# Patient Record
Sex: Female | Born: 1960 | Race: Black or African American | Hispanic: No | Marital: Married | Smoking: Current some day smoker
Health system: Southern US, Community
[De-identification: ages and names within clinical notes are randomized; demographics above are authoritative.]

## PROBLEM LIST (undated history)

## (undated) DIAGNOSIS — K5790 Diverticulosis of intestine, part unspecified, without perforation or abscess without bleeding: Secondary | ICD-10-CM

## (undated) DIAGNOSIS — C50919 Malignant neoplasm of unspecified site of unspecified female breast: Secondary | ICD-10-CM

## (undated) DIAGNOSIS — M545 Low back pain, unspecified: Secondary | ICD-10-CM

## (undated) DIAGNOSIS — M1990 Unspecified osteoarthritis, unspecified site: Secondary | ICD-10-CM

## (undated) DIAGNOSIS — K635 Polyp of colon: Secondary | ICD-10-CM

## (undated) DIAGNOSIS — Z923 Personal history of irradiation: Secondary | ICD-10-CM

## (undated) DIAGNOSIS — I10 Essential (primary) hypertension: Secondary | ICD-10-CM

## (undated) DIAGNOSIS — C50311 Malignant neoplasm of lower-inner quadrant of right female breast: Principal | ICD-10-CM

## (undated) DIAGNOSIS — R51 Headache: Secondary | ICD-10-CM

## (undated) DIAGNOSIS — E119 Type 2 diabetes mellitus without complications: Secondary | ICD-10-CM

## (undated) DIAGNOSIS — Z9221 Personal history of antineoplastic chemotherapy: Secondary | ICD-10-CM

## (undated) DIAGNOSIS — K8020 Calculus of gallbladder without cholecystitis without obstruction: Secondary | ICD-10-CM

## (undated) DIAGNOSIS — M5030 Other cervical disc degeneration, unspecified cervical region: Secondary | ICD-10-CM

## (undated) DIAGNOSIS — G8929 Other chronic pain: Secondary | ICD-10-CM

## (undated) DIAGNOSIS — K219 Gastro-esophageal reflux disease without esophagitis: Secondary | ICD-10-CM

## (undated) HISTORY — DX: Headache: R51

## (undated) HISTORY — DX: Unspecified osteoarthritis, unspecified site: M19.90

## (undated) HISTORY — DX: Low back pain, unspecified: M54.50

## (undated) HISTORY — DX: Type 2 diabetes mellitus without complications: E11.9

## (undated) HISTORY — DX: Essential (primary) hypertension: I10

## (undated) HISTORY — DX: Polyp of colon: K63.5

## (undated) HISTORY — PX: TONSILLECTOMY: SUR1361

## (undated) HISTORY — DX: Malignant neoplasm of lower-inner quadrant of right female breast: C50.311

## (undated) HISTORY — DX: Malignant neoplasm of unspecified site of unspecified female breast: C50.919

## (undated) HISTORY — DX: Diverticulosis of intestine, part unspecified, without perforation or abscess without bleeding: K57.90

## (undated) HISTORY — DX: Low back pain: M54.5

## (undated) HISTORY — DX: Calculus of gallbladder without cholecystitis without obstruction: K80.20

## (undated) HISTORY — PX: GANGLION CYST EXCISION: SHX1691

## (undated) HISTORY — PX: KNEE ARTHROSCOPY: SHX127

---

## 1998-04-13 ENCOUNTER — Ambulatory Visit (HOSPITAL_COMMUNITY): Admission: RE | Admit: 1998-04-13 | Discharge: 1998-04-13 | Payer: Self-pay | Admitting: Family Medicine

## 1998-06-28 ENCOUNTER — Encounter: Payer: Self-pay | Admitting: Family Medicine

## 1998-06-28 ENCOUNTER — Ambulatory Visit (HOSPITAL_COMMUNITY): Admission: RE | Admit: 1998-06-28 | Discharge: 1998-06-28 | Payer: Self-pay | Admitting: Family Medicine

## 1998-07-21 ENCOUNTER — Emergency Department (HOSPITAL_COMMUNITY): Admission: EM | Admit: 1998-07-21 | Discharge: 1998-07-21 | Payer: Self-pay | Admitting: Emergency Medicine

## 1998-07-21 ENCOUNTER — Encounter: Payer: Self-pay | Admitting: Emergency Medicine

## 1998-07-22 ENCOUNTER — Emergency Department (HOSPITAL_COMMUNITY): Admission: EM | Admit: 1998-07-22 | Discharge: 1998-07-22 | Payer: Self-pay | Admitting: Emergency Medicine

## 1998-08-21 ENCOUNTER — Ambulatory Visit (HOSPITAL_BASED_OUTPATIENT_CLINIC_OR_DEPARTMENT_OTHER): Admission: RE | Admit: 1998-08-21 | Discharge: 1998-08-21 | Payer: Self-pay | Admitting: Orthopaedic Surgery

## 1998-10-31 ENCOUNTER — Emergency Department (HOSPITAL_COMMUNITY): Admission: EM | Admit: 1998-10-31 | Discharge: 1998-11-01 | Payer: Self-pay

## 1999-03-05 ENCOUNTER — Encounter: Payer: Self-pay | Admitting: Internal Medicine

## 1999-03-05 ENCOUNTER — Ambulatory Visit (HOSPITAL_COMMUNITY): Admission: RE | Admit: 1999-03-05 | Discharge: 1999-03-05 | Payer: Self-pay | Admitting: Internal Medicine

## 1999-04-09 ENCOUNTER — Encounter: Payer: Self-pay | Admitting: Neurosurgery

## 1999-04-09 ENCOUNTER — Observation Stay (HOSPITAL_COMMUNITY): Admission: RE | Admit: 1999-04-09 | Discharge: 1999-04-10 | Payer: Self-pay | Admitting: Neurosurgery

## 1999-06-12 ENCOUNTER — Encounter: Payer: Self-pay | Admitting: Emergency Medicine

## 1999-06-12 ENCOUNTER — Emergency Department (HOSPITAL_COMMUNITY): Admission: EM | Admit: 1999-06-12 | Discharge: 1999-06-12 | Payer: Self-pay | Admitting: Emergency Medicine

## 1999-11-06 ENCOUNTER — Ambulatory Visit (HOSPITAL_COMMUNITY): Admission: RE | Admit: 1999-11-06 | Discharge: 1999-11-06 | Payer: Self-pay | Admitting: Family Medicine

## 1999-11-06 ENCOUNTER — Encounter (INDEPENDENT_AMBULATORY_CARE_PROVIDER_SITE_OTHER): Payer: Self-pay

## 1999-12-23 ENCOUNTER — Emergency Department (HOSPITAL_COMMUNITY): Admission: EM | Admit: 1999-12-23 | Discharge: 1999-12-23 | Payer: Self-pay | Admitting: Internal Medicine

## 2000-01-02 ENCOUNTER — Ambulatory Visit (HOSPITAL_BASED_OUTPATIENT_CLINIC_OR_DEPARTMENT_OTHER): Admission: RE | Admit: 2000-01-02 | Discharge: 2000-01-02 | Payer: Self-pay | Admitting: Orthopaedic Surgery

## 2000-01-02 HISTORY — PX: KNEE ARTHROSCOPY: SHX127

## 2000-08-05 ENCOUNTER — Encounter: Payer: Self-pay | Admitting: *Deleted

## 2000-08-05 ENCOUNTER — Ambulatory Visit (HOSPITAL_COMMUNITY): Admission: RE | Admit: 2000-08-05 | Discharge: 2000-08-05 | Payer: Self-pay | Admitting: *Deleted

## 2000-08-27 ENCOUNTER — Ambulatory Visit (HOSPITAL_COMMUNITY): Admission: RE | Admit: 2000-08-27 | Discharge: 2000-08-27 | Payer: Self-pay | Admitting: *Deleted

## 2000-11-11 ENCOUNTER — Encounter: Admission: RE | Admit: 2000-11-11 | Discharge: 2000-11-11 | Payer: Self-pay | Admitting: Family Medicine

## 2000-11-11 ENCOUNTER — Encounter: Payer: Self-pay | Admitting: Family Medicine

## 2001-02-06 ENCOUNTER — Ambulatory Visit (HOSPITAL_COMMUNITY): Admission: RE | Admit: 2001-02-06 | Discharge: 2001-02-06 | Payer: Self-pay | Admitting: Orthopedic Surgery

## 2001-04-16 ENCOUNTER — Encounter: Payer: Self-pay | Admitting: Orthopaedic Surgery

## 2001-04-20 ENCOUNTER — Inpatient Hospital Stay (HOSPITAL_COMMUNITY): Admission: RE | Admit: 2001-04-20 | Discharge: 2001-04-23 | Payer: Self-pay | Admitting: Orthopaedic Surgery

## 2001-04-20 HISTORY — PX: REPLACEMENT UNICONDYLAR JOINT KNEE: SUR1227

## 2001-10-12 ENCOUNTER — Inpatient Hospital Stay (HOSPITAL_COMMUNITY): Admission: RE | Admit: 2001-10-12 | Discharge: 2001-10-16 | Payer: Self-pay | Admitting: Orthopaedic Surgery

## 2001-10-12 HISTORY — PX: REVISION TOTAL KNEE ARTHROPLASTY: SHX767

## 2001-11-03 ENCOUNTER — Encounter: Admission: RE | Admit: 2001-11-03 | Discharge: 2002-02-01 | Payer: Self-pay | Admitting: Orthopaedic Surgery

## 2001-12-20 ENCOUNTER — Ambulatory Visit (HOSPITAL_COMMUNITY): Admission: RE | Admit: 2001-12-20 | Discharge: 2001-12-20 | Payer: Self-pay | Admitting: Orthopaedic Surgery

## 2002-03-29 ENCOUNTER — Ambulatory Visit (HOSPITAL_BASED_OUTPATIENT_CLINIC_OR_DEPARTMENT_OTHER): Admission: RE | Admit: 2002-03-29 | Discharge: 2002-03-29 | Payer: Self-pay | Admitting: Orthopaedic Surgery

## 2002-07-18 ENCOUNTER — Emergency Department (HOSPITAL_COMMUNITY): Admission: EM | Admit: 2002-07-18 | Discharge: 2002-07-19 | Payer: Self-pay | Admitting: Emergency Medicine

## 2002-11-10 ENCOUNTER — Encounter: Payer: Self-pay | Admitting: Orthopaedic Surgery

## 2002-11-15 ENCOUNTER — Inpatient Hospital Stay (HOSPITAL_COMMUNITY): Admission: RE | Admit: 2002-11-15 | Discharge: 2002-11-19 | Payer: Self-pay | Admitting: Orthopaedic Surgery

## 2002-11-15 HISTORY — PX: REVISION TOTAL KNEE ARTHROPLASTY: SHX767

## 2002-11-28 ENCOUNTER — Emergency Department (HOSPITAL_COMMUNITY): Admission: EM | Admit: 2002-11-28 | Discharge: 2002-11-29 | Payer: Self-pay | Admitting: Emergency Medicine

## 2002-12-08 ENCOUNTER — Ambulatory Visit (HOSPITAL_COMMUNITY): Admission: RE | Admit: 2002-12-08 | Discharge: 2002-12-08 | Payer: Self-pay | Admitting: Orthopaedic Surgery

## 2002-12-27 ENCOUNTER — Ambulatory Visit (HOSPITAL_BASED_OUTPATIENT_CLINIC_OR_DEPARTMENT_OTHER): Admission: RE | Admit: 2002-12-27 | Discharge: 2002-12-27 | Payer: Self-pay | Admitting: Orthopaedic Surgery

## 2002-12-27 HISTORY — PX: EXAM UNDER ANESTHESIA WITH MANIPULATION OF KNEE: SHX5816

## 2003-01-09 ENCOUNTER — Encounter: Admission: RE | Admit: 2003-01-09 | Discharge: 2003-04-09 | Payer: Self-pay | Admitting: Orthopaedic Surgery

## 2003-04-18 ENCOUNTER — Emergency Department (HOSPITAL_COMMUNITY): Admission: EM | Admit: 2003-04-18 | Discharge: 2003-04-18 | Payer: Self-pay | Admitting: Emergency Medicine

## 2003-04-19 ENCOUNTER — Emergency Department (HOSPITAL_COMMUNITY): Admission: EM | Admit: 2003-04-19 | Discharge: 2003-04-19 | Payer: Self-pay

## 2003-04-20 ENCOUNTER — Encounter: Payer: Self-pay | Admitting: Orthopaedic Surgery

## 2003-04-25 ENCOUNTER — Inpatient Hospital Stay (HOSPITAL_COMMUNITY): Admission: RE | Admit: 2003-04-25 | Discharge: 2003-04-29 | Payer: Self-pay | Admitting: Orthopaedic Surgery

## 2003-04-25 HISTORY — PX: TOTAL KNEE ARTHROPLASTY: SHX125

## 2003-05-30 ENCOUNTER — Ambulatory Visit (HOSPITAL_BASED_OUTPATIENT_CLINIC_OR_DEPARTMENT_OTHER): Admission: RE | Admit: 2003-05-30 | Discharge: 2003-05-30 | Payer: Self-pay | Admitting: Orthopaedic Surgery

## 2003-05-30 HISTORY — PX: EXAM UNDER ANESTHESIA WITH MANIPULATION OF KNEE: SHX5816

## 2003-11-22 ENCOUNTER — Other Ambulatory Visit: Admission: RE | Admit: 2003-11-22 | Discharge: 2003-11-22 | Payer: Self-pay | Admitting: Family Medicine

## 2004-05-17 ENCOUNTER — Emergency Department (HOSPITAL_COMMUNITY): Admission: EM | Admit: 2004-05-17 | Discharge: 2004-05-17 | Payer: Self-pay | Admitting: Emergency Medicine

## 2004-06-18 ENCOUNTER — Inpatient Hospital Stay (HOSPITAL_COMMUNITY): Admission: RE | Admit: 2004-06-18 | Discharge: 2004-06-22 | Payer: Self-pay | Admitting: Orthopaedic Surgery

## 2004-06-18 HISTORY — PX: REVISION TOTAL KNEE ARTHROPLASTY: SHX767

## 2004-08-08 ENCOUNTER — Emergency Department (HOSPITAL_COMMUNITY): Admission: EM | Admit: 2004-08-08 | Discharge: 2004-08-09 | Payer: Self-pay | Admitting: Emergency Medicine

## 2004-08-09 ENCOUNTER — Ambulatory Visit (HOSPITAL_COMMUNITY): Admission: RE | Admit: 2004-08-09 | Discharge: 2004-08-09 | Payer: Self-pay | Admitting: Orthopaedic Surgery

## 2004-12-04 ENCOUNTER — Ambulatory Visit: Payer: Self-pay | Admitting: Family Medicine

## 2005-03-24 ENCOUNTER — Ambulatory Visit: Payer: Self-pay | Admitting: Family Medicine

## 2005-07-04 ENCOUNTER — Ambulatory Visit: Payer: Self-pay | Admitting: Family Medicine

## 2005-08-08 ENCOUNTER — Ambulatory Visit: Payer: Self-pay | Admitting: Family Medicine

## 2005-08-21 ENCOUNTER — Encounter: Admission: RE | Admit: 2005-08-21 | Discharge: 2005-08-21 | Payer: Self-pay | Admitting: Family Medicine

## 2005-09-04 ENCOUNTER — Ambulatory Visit: Payer: Self-pay | Admitting: Family Medicine

## 2005-09-16 ENCOUNTER — Encounter: Admission: RE | Admit: 2005-09-16 | Discharge: 2005-09-16 | Payer: Self-pay | Admitting: Orthopaedic Surgery

## 2005-10-02 ENCOUNTER — Ambulatory Visit: Payer: Self-pay | Admitting: Family Medicine

## 2005-11-21 ENCOUNTER — Ambulatory Visit: Payer: Self-pay | Admitting: Family Medicine

## 2005-12-30 ENCOUNTER — Ambulatory Visit: Payer: Self-pay | Admitting: Family Medicine

## 2006-01-09 ENCOUNTER — Ambulatory Visit: Payer: Self-pay | Admitting: Cardiology

## 2006-01-09 ENCOUNTER — Inpatient Hospital Stay (HOSPITAL_COMMUNITY): Admission: EM | Admit: 2006-01-09 | Discharge: 2006-01-10 | Payer: Self-pay | Admitting: Emergency Medicine

## 2006-01-10 ENCOUNTER — Ambulatory Visit: Payer: Self-pay | Admitting: Internal Medicine

## 2006-01-12 ENCOUNTER — Encounter: Admission: RE | Admit: 2006-01-12 | Discharge: 2006-01-12 | Payer: Self-pay | Admitting: Orthopaedic Surgery

## 2006-01-21 ENCOUNTER — Ambulatory Visit: Payer: Self-pay | Admitting: Family Medicine

## 2006-03-19 ENCOUNTER — Ambulatory Visit: Payer: Self-pay | Admitting: Family Medicine

## 2006-04-05 ENCOUNTER — Emergency Department (HOSPITAL_COMMUNITY): Admission: EM | Admit: 2006-04-05 | Discharge: 2006-04-05 | Payer: Self-pay | Admitting: Family Medicine

## 2006-04-29 ENCOUNTER — Ambulatory Visit: Payer: Self-pay | Admitting: Family Medicine

## 2006-04-30 ENCOUNTER — Emergency Department (HOSPITAL_COMMUNITY): Admission: EM | Admit: 2006-04-30 | Discharge: 2006-04-30 | Payer: Self-pay | Admitting: Emergency Medicine

## 2006-04-30 ENCOUNTER — Encounter (INDEPENDENT_AMBULATORY_CARE_PROVIDER_SITE_OTHER): Payer: Self-pay | Admitting: *Deleted

## 2006-05-01 ENCOUNTER — Ambulatory Visit: Payer: Self-pay | Admitting: Family Medicine

## 2006-05-19 ENCOUNTER — Ambulatory Visit: Payer: Self-pay | Admitting: Family Medicine

## 2006-05-25 ENCOUNTER — Ambulatory Visit (HOSPITAL_COMMUNITY): Admission: RE | Admit: 2006-05-25 | Discharge: 2006-05-25 | Payer: Self-pay | Admitting: Anesthesiology

## 2006-06-10 ENCOUNTER — Ambulatory Visit: Payer: Self-pay | Admitting: Family Medicine

## 2006-07-13 ENCOUNTER — Encounter (INDEPENDENT_AMBULATORY_CARE_PROVIDER_SITE_OTHER): Payer: Self-pay | Admitting: Specialist

## 2006-07-13 HISTORY — PX: LAPAROSCOPIC VAGINAL HYSTERECTOMY WITH SALPINGO OOPHORECTOMY: SHX6681

## 2006-07-14 ENCOUNTER — Inpatient Hospital Stay (HOSPITAL_COMMUNITY): Admission: RE | Admit: 2006-07-14 | Discharge: 2006-07-15 | Payer: Self-pay | Admitting: Obstetrics and Gynecology

## 2006-07-14 ENCOUNTER — Encounter (INDEPENDENT_AMBULATORY_CARE_PROVIDER_SITE_OTHER): Payer: Self-pay | Admitting: *Deleted

## 2006-08-19 ENCOUNTER — Inpatient Hospital Stay (HOSPITAL_COMMUNITY): Admission: EM | Admit: 2006-08-19 | Discharge: 2006-08-25 | Payer: Self-pay | Admitting: Emergency Medicine

## 2006-08-19 ENCOUNTER — Ambulatory Visit: Payer: Self-pay | Admitting: Internal Medicine

## 2006-08-20 ENCOUNTER — Ambulatory Visit: Payer: Self-pay | Admitting: Internal Medicine

## 2006-08-31 ENCOUNTER — Ambulatory Visit: Payer: Self-pay | Admitting: Family Medicine

## 2006-10-20 ENCOUNTER — Ambulatory Visit: Payer: Self-pay | Admitting: Family Medicine

## 2007-01-19 ENCOUNTER — Ambulatory Visit: Payer: Self-pay | Admitting: Family Medicine

## 2007-01-25 ENCOUNTER — Ambulatory Visit: Payer: Self-pay | Admitting: Family Medicine

## 2007-01-25 LAB — CONVERTED CEMR LAB
ALT: 15 units/L (ref 0–40)
AST: 18 units/L (ref 0–37)
Albumin: 4 g/dL (ref 3.5–5.2)
Alkaline Phosphatase: 87 units/L (ref 39–117)
BUN: 12 mg/dL (ref 6–23)
Basophils Absolute: 0 10*3/uL (ref 0.0–0.1)
Basophils Relative: 0.7 % (ref 0.0–1.0)
Bilirubin, Direct: 0.1 mg/dL (ref 0.0–0.3)
CO2: 30 meq/L (ref 19–32)
Calcium: 9.6 mg/dL (ref 8.4–10.5)
Chloride: 108 meq/L (ref 96–112)
Cholesterol: 243 mg/dL (ref 0–200)
Creatinine, Ser: 0.8 mg/dL (ref 0.4–1.2)
Direct LDL: 161.6 mg/dL
Eosinophils Absolute: 0.1 10*3/uL (ref 0.0–0.6)
Eosinophils Relative: 1.3 % (ref 0.0–5.0)
GFR calc Af Amer: 99 mL/min
GFR calc non Af Amer: 82 mL/min
Glucose, Bld: 102 mg/dL — ABNORMAL HIGH (ref 70–99)
HCT: 37.6 % (ref 36.0–46.0)
HDL: 37.9 mg/dL — ABNORMAL LOW (ref >39.0)
Hemoglobin: 13.1 g/dL (ref 12.0–15.0)
Lymphocytes Relative: 36.5 % (ref 12.0–46.0)
MCHC: 34.8 g/dL (ref 30.0–36.0)
MCV: 86.7 fL (ref 78.0–100.0)
Monocytes Absolute: 0.3 10*3/uL (ref 0.2–0.7)
Monocytes Relative: 4.4 % (ref 3.0–11.0)
Neutro Abs: 3.3 10*3/uL (ref 1.4–7.7)
Neutrophils Relative %: 57.1 % (ref 43.0–77.0)
Platelets: 326 10*3/uL (ref 150–400)
Potassium: 3.6 meq/L (ref 3.5–5.1)
RBC: 4.33 M/uL (ref 3.87–5.11)
RDW: 12.6 % (ref 11.5–14.6)
Sodium: 145 meq/L (ref 135–145)
TSH: 0.69 microintl units/mL (ref 0.35–5.50)
Total Bilirubin: 0.6 mg/dL (ref 0.3–1.2)
Total CHOL/HDL Ratio: 6.4
Total Protein: 7.1 g/dL (ref 6.0–8.3)
Triglycerides: 151 mg/dL — ABNORMAL HIGH (ref 0–149)
VLDL: 30 mg/dL (ref 0–40)
WBC: 5.9 10*3/uL (ref 4.5–10.5)

## 2007-03-15 DIAGNOSIS — R519 Headache, unspecified: Secondary | ICD-10-CM | POA: Insufficient documentation

## 2007-03-15 DIAGNOSIS — N6009 Solitary cyst of unspecified breast: Secondary | ICD-10-CM | POA: Insufficient documentation

## 2007-03-15 DIAGNOSIS — M1710 Unilateral primary osteoarthritis, unspecified knee: Secondary | ICD-10-CM | POA: Insufficient documentation

## 2007-03-15 DIAGNOSIS — R51 Headache: Secondary | ICD-10-CM | POA: Insufficient documentation

## 2007-03-15 DIAGNOSIS — I10 Essential (primary) hypertension: Secondary | ICD-10-CM | POA: Insufficient documentation

## 2007-04-11 ENCOUNTER — Emergency Department (HOSPITAL_COMMUNITY): Admission: EM | Admit: 2007-04-11 | Discharge: 2007-04-11 | Payer: Self-pay | Admitting: Emergency Medicine

## 2007-05-18 ENCOUNTER — Ambulatory Visit: Payer: Self-pay | Admitting: Family Medicine

## 2007-05-18 DIAGNOSIS — H109 Unspecified conjunctivitis: Secondary | ICD-10-CM | POA: Insufficient documentation

## 2007-05-19 ENCOUNTER — Telehealth: Payer: Self-pay | Admitting: Family Medicine

## 2007-05-20 ENCOUNTER — Telehealth: Payer: Self-pay | Admitting: Family Medicine

## 2007-05-31 ENCOUNTER — Emergency Department (HOSPITAL_COMMUNITY): Admission: EM | Admit: 2007-05-31 | Discharge: 2007-05-31 | Payer: Self-pay | Admitting: Emergency Medicine

## 2007-06-07 ENCOUNTER — Ambulatory Visit: Payer: Self-pay | Admitting: Family Medicine

## 2007-06-07 DIAGNOSIS — M25519 Pain in unspecified shoulder: Secondary | ICD-10-CM | POA: Insufficient documentation

## 2007-06-18 ENCOUNTER — Telehealth: Payer: Self-pay | Admitting: Family Medicine

## 2007-07-16 ENCOUNTER — Telehealth: Payer: Self-pay | Admitting: Family Medicine

## 2007-08-24 ENCOUNTER — Emergency Department (HOSPITAL_COMMUNITY): Admission: EM | Admit: 2007-08-24 | Discharge: 2007-08-25 | Payer: Self-pay | Admitting: Emergency Medicine

## 2007-08-30 ENCOUNTER — Ambulatory Visit: Payer: Self-pay | Admitting: Family Medicine

## 2007-08-30 DIAGNOSIS — R1033 Periumbilical pain: Secondary | ICD-10-CM | POA: Insufficient documentation

## 2007-08-31 ENCOUNTER — Ambulatory Visit: Payer: Self-pay | Admitting: Cardiology

## 2007-08-31 ENCOUNTER — Encounter (INDEPENDENT_AMBULATORY_CARE_PROVIDER_SITE_OTHER): Payer: Self-pay | Admitting: *Deleted

## 2007-10-01 ENCOUNTER — Encounter: Payer: Self-pay | Admitting: Family Medicine

## 2007-10-20 ENCOUNTER — Ambulatory Visit: Payer: Self-pay | Admitting: Family Medicine

## 2007-10-20 DIAGNOSIS — H6690 Otitis media, unspecified, unspecified ear: Secondary | ICD-10-CM | POA: Insufficient documentation

## 2007-10-20 DIAGNOSIS — R079 Chest pain, unspecified: Secondary | ICD-10-CM | POA: Insufficient documentation

## 2007-10-27 ENCOUNTER — Telehealth: Payer: Self-pay | Admitting: Family Medicine

## 2008-01-09 ENCOUNTER — Emergency Department (HOSPITAL_COMMUNITY): Admission: EM | Admit: 2008-01-09 | Discharge: 2008-01-09 | Payer: Self-pay | Admitting: Emergency Medicine

## 2008-01-09 ENCOUNTER — Encounter (INDEPENDENT_AMBULATORY_CARE_PROVIDER_SITE_OTHER): Payer: Self-pay | Admitting: *Deleted

## 2008-02-21 HISTORY — PX: LIPOMA EXCISION: SHX5283

## 2008-05-10 ENCOUNTER — Telehealth: Payer: Self-pay | Admitting: Family Medicine

## 2008-05-10 ENCOUNTER — Emergency Department (HOSPITAL_COMMUNITY): Admission: EM | Admit: 2008-05-10 | Discharge: 2008-05-10 | Payer: Self-pay | Admitting: Emergency Medicine

## 2008-05-12 ENCOUNTER — Telehealth: Payer: Self-pay | Admitting: Family Medicine

## 2008-05-12 ENCOUNTER — Ambulatory Visit: Payer: Self-pay | Admitting: Family Medicine

## 2008-05-12 DIAGNOSIS — K5732 Diverticulitis of large intestine without perforation or abscess without bleeding: Secondary | ICD-10-CM | POA: Insufficient documentation

## 2008-05-12 LAB — CONVERTED CEMR LAB
Bilirubin Urine: NEGATIVE
Blood in Urine, dipstick: NEGATIVE
Glucose, Urine, Semiquant: NEGATIVE
Ketones, urine, test strip: NEGATIVE
Nitrite: NEGATIVE
Protein, U semiquant: NEGATIVE
Specific Gravity, Urine: 1.015
Urobilinogen, UA: 0.2
WBC Urine, dipstick: NEGATIVE
pH: 5

## 2008-07-25 ENCOUNTER — Ambulatory Visit: Payer: Self-pay | Admitting: Family Medicine

## 2008-07-25 DIAGNOSIS — R252 Cramp and spasm: Secondary | ICD-10-CM | POA: Insufficient documentation

## 2008-07-25 DIAGNOSIS — R609 Edema, unspecified: Secondary | ICD-10-CM | POA: Insufficient documentation

## 2008-07-25 DIAGNOSIS — M545 Low back pain: Secondary | ICD-10-CM

## 2008-07-27 ENCOUNTER — Telehealth: Payer: Self-pay | Admitting: Family Medicine

## 2008-07-27 LAB — CONVERTED CEMR LAB
ALT: 15 units/L (ref 0–35)
AST: 22 units/L (ref 0–37)
Albumin: 4.2 g/dL (ref 3.5–5.2)
Alkaline Phosphatase: 101 units/L (ref 39–117)
BUN: 9 mg/dL (ref 6–23)
Basophils Absolute: 0.1 10*3/uL (ref 0.0–0.1)
Basophils Relative: 0.8 % (ref 0.0–3.0)
Bilirubin, Direct: 0.1 mg/dL (ref 0.0–0.3)
CO2: 29 meq/L (ref 19–32)
Calcium: 9.3 mg/dL (ref 8.4–10.5)
Chloride: 106 meq/L (ref 96–112)
Creatinine, Ser: 0.8 mg/dL (ref 0.4–1.2)
Eosinophils Absolute: 0.1 10*3/uL (ref 0.0–0.7)
Eosinophils Relative: 1.2 % (ref 0.0–5.0)
GFR calc Af Amer: 98 mL/min
GFR calc non Af Amer: 81 mL/min
Glucose, Bld: 70 mg/dL (ref 70–99)
HCT: 38.9 % (ref 36.0–46.0)
Hemoglobin: 13.1 g/dL (ref 12.0–15.0)
Lymphocytes Relative: 37 % (ref 12.0–46.0)
MCHC: 33.8 g/dL (ref 30.0–36.0)
MCV: 89.4 fL (ref 78.0–100.0)
Monocytes Absolute: 0.3 10*3/uL (ref 0.1–1.0)
Monocytes Relative: 4.6 % (ref 3.0–12.0)
Neutro Abs: 4.2 10*3/uL (ref 1.4–7.7)
Neutrophils Relative %: 56.4 % (ref 43.0–77.0)
Platelets: 312 10*3/uL (ref 150–400)
Potassium: 3.1 meq/L — ABNORMAL LOW (ref 3.5–5.1)
RBC: 4.35 M/uL (ref 3.87–5.11)
RDW: 13.5 % (ref 11.5–14.6)
Sodium: 143 meq/L (ref 135–145)
TSH: 1.65 microintl units/mL (ref 0.35–5.50)
Total Bilirubin: 0.5 mg/dL (ref 0.3–1.2)
Total Protein: 7.7 g/dL (ref 6.0–8.3)
WBC: 7.4 10*3/uL (ref 4.5–10.5)

## 2008-07-28 ENCOUNTER — Telehealth: Payer: Self-pay | Admitting: Family Medicine

## 2008-07-29 ENCOUNTER — Emergency Department (HOSPITAL_COMMUNITY): Admission: EM | Admit: 2008-07-29 | Discharge: 2008-07-30 | Payer: Self-pay | Admitting: Emergency Medicine

## 2008-08-02 ENCOUNTER — Ambulatory Visit: Payer: Self-pay | Admitting: Family Medicine

## 2008-08-02 DIAGNOSIS — IMO0002 Reserved for concepts with insufficient information to code with codable children: Secondary | ICD-10-CM | POA: Insufficient documentation

## 2008-08-02 DIAGNOSIS — R112 Nausea with vomiting, unspecified: Secondary | ICD-10-CM | POA: Insufficient documentation

## 2008-08-04 LAB — CONVERTED CEMR LAB
BUN: 13 mg/dL (ref 6–23)
CO2: 27 meq/L (ref 19–32)
Calcium: 9.3 mg/dL (ref 8.4–10.5)
Chloride: 109 meq/L (ref 96–112)
Creatinine, Ser: 0.8 mg/dL (ref 0.4–1.2)
GFR calc Af Amer: 98 mL/min
GFR calc non Af Amer: 81 mL/min
Glucose, Bld: 109 mg/dL — ABNORMAL HIGH (ref 70–99)
Potassium: 4.2 meq/L (ref 3.5–5.1)
Sodium: 143 meq/L (ref 135–145)

## 2008-08-30 ENCOUNTER — Emergency Department (HOSPITAL_COMMUNITY): Admission: EM | Admit: 2008-08-30 | Discharge: 2008-08-31 | Payer: Self-pay | Admitting: Emergency Medicine

## 2008-09-01 ENCOUNTER — Ambulatory Visit: Payer: Self-pay | Admitting: Family Medicine

## 2008-12-18 ENCOUNTER — Ambulatory Visit: Payer: Self-pay | Admitting: Family Medicine

## 2008-12-18 DIAGNOSIS — N3000 Acute cystitis without hematuria: Secondary | ICD-10-CM | POA: Insufficient documentation

## 2008-12-18 DIAGNOSIS — M109 Gout, unspecified: Secondary | ICD-10-CM | POA: Insufficient documentation

## 2008-12-18 LAB — CONVERTED CEMR LAB
Bilirubin Urine: NEGATIVE
Glucose, Urine, Semiquant: NEGATIVE
Ketones, urine, test strip: NEGATIVE
Nitrite: NEGATIVE
Protein, U semiquant: NEGATIVE
Specific Gravity, Urine: 1.01
Urobilinogen, UA: 0.2
WBC Urine, dipstick: NEGATIVE
pH: 6.5

## 2008-12-21 ENCOUNTER — Encounter: Admission: RE | Admit: 2008-12-21 | Discharge: 2008-12-21 | Payer: Self-pay | Admitting: Family Medicine

## 2008-12-21 LAB — HM MAMMOGRAPHY

## 2008-12-25 ENCOUNTER — Telehealth: Payer: Self-pay | Admitting: Family Medicine

## 2009-01-18 ENCOUNTER — Emergency Department (HOSPITAL_COMMUNITY): Admission: EM | Admit: 2009-01-18 | Discharge: 2009-01-18 | Payer: Self-pay | Admitting: Emergency Medicine

## 2009-02-05 ENCOUNTER — Ambulatory Visit: Payer: Self-pay | Admitting: Family Medicine

## 2009-02-05 DIAGNOSIS — R300 Dysuria: Secondary | ICD-10-CM | POA: Insufficient documentation

## 2009-02-05 LAB — CONVERTED CEMR LAB
Bilirubin Urine: NEGATIVE
Glucose, Urine, Semiquant: NEGATIVE
Ketones, urine, test strip: NEGATIVE
Nitrite: NEGATIVE
Specific Gravity, Urine: 1.015
Urobilinogen, UA: 0.2
WBC Urine, dipstick: NEGATIVE
pH: 6.5

## 2009-02-26 ENCOUNTER — Ambulatory Visit: Payer: Self-pay | Admitting: Family Medicine

## 2009-02-26 DIAGNOSIS — L509 Urticaria, unspecified: Secondary | ICD-10-CM | POA: Insufficient documentation

## 2009-06-25 HISTORY — PX: TOTAL KNEE ARTHROPLASTY: SHX125

## 2009-07-09 ENCOUNTER — Ambulatory Visit: Payer: Self-pay | Admitting: Family Medicine

## 2009-07-09 DIAGNOSIS — M25569 Pain in unspecified knee: Secondary | ICD-10-CM | POA: Insufficient documentation

## 2009-07-09 LAB — CONVERTED CEMR LAB
Bilirubin Urine: NEGATIVE
Glucose, Urine, Semiquant: NEGATIVE
Nitrite: NEGATIVE
Specific Gravity, Urine: 1.015
Urobilinogen, UA: 0.2
WBC Urine, dipstick: NEGATIVE
pH: 7

## 2009-07-30 ENCOUNTER — Ambulatory Visit: Payer: Self-pay | Admitting: Family Medicine

## 2009-07-30 DIAGNOSIS — R310 Gross hematuria: Secondary | ICD-10-CM | POA: Insufficient documentation

## 2009-07-30 LAB — CONVERTED CEMR LAB
Bilirubin Urine: NEGATIVE
Glucose, Urine, Semiquant: NEGATIVE
Ketones, urine, test strip: NEGATIVE
Nitrite: NEGATIVE
Protein, U semiquant: NEGATIVE
Specific Gravity, Urine: 1.015
Urobilinogen, UA: 0.2
WBC Urine, dipstick: NEGATIVE
pH: 6.5

## 2009-07-31 ENCOUNTER — Encounter: Payer: Self-pay | Admitting: Family Medicine

## 2009-08-30 ENCOUNTER — Emergency Department (HOSPITAL_COMMUNITY): Admission: EM | Admit: 2009-08-30 | Discharge: 2009-08-31 | Payer: Self-pay | Admitting: Emergency Medicine

## 2009-10-29 ENCOUNTER — Telehealth: Payer: Self-pay | Admitting: Family Medicine

## 2009-10-29 ENCOUNTER — Ambulatory Visit: Payer: Self-pay | Admitting: Family Medicine

## 2009-10-29 DIAGNOSIS — J209 Acute bronchitis, unspecified: Secondary | ICD-10-CM | POA: Insufficient documentation

## 2009-11-19 ENCOUNTER — Ambulatory Visit (HOSPITAL_COMMUNITY): Admission: RE | Admit: 2009-11-19 | Discharge: 2009-11-19 | Payer: Self-pay | Admitting: Anesthesiology

## 2010-01-19 ENCOUNTER — Emergency Department (HOSPITAL_COMMUNITY): Admission: EM | Admit: 2010-01-19 | Discharge: 2010-01-19 | Payer: Self-pay | Admitting: Emergency Medicine

## 2010-01-19 ENCOUNTER — Encounter (INDEPENDENT_AMBULATORY_CARE_PROVIDER_SITE_OTHER): Payer: Self-pay | Admitting: *Deleted

## 2010-01-21 ENCOUNTER — Ambulatory Visit: Payer: Self-pay | Admitting: Family Medicine

## 2010-01-21 DIAGNOSIS — N289 Disorder of kidney and ureter, unspecified: Secondary | ICD-10-CM | POA: Insufficient documentation

## 2010-01-21 DIAGNOSIS — R109 Unspecified abdominal pain: Secondary | ICD-10-CM | POA: Insufficient documentation

## 2010-01-21 LAB — CONVERTED CEMR LAB
Bilirubin Urine: NEGATIVE
Glucose, Urine, Semiquant: NEGATIVE
Ketones, urine, test strip: NEGATIVE
Nitrite: NEGATIVE
Specific Gravity, Urine: <1.005
Urobilinogen, UA: 0.2
WBC Urine, dipstick: NEGATIVE
pH: 6

## 2010-01-22 ENCOUNTER — Encounter: Payer: Self-pay | Admitting: Family Medicine

## 2010-01-24 ENCOUNTER — Encounter: Admission: RE | Admit: 2010-01-24 | Discharge: 2010-01-24 | Payer: Self-pay | Admitting: Family Medicine

## 2010-01-25 ENCOUNTER — Telehealth: Payer: Self-pay | Admitting: Family Medicine

## 2010-02-14 ENCOUNTER — Telehealth: Payer: Self-pay | Admitting: Family Medicine

## 2010-03-17 ENCOUNTER — Emergency Department (HOSPITAL_COMMUNITY): Admission: EM | Admit: 2010-03-17 | Discharge: 2010-03-17 | Payer: Self-pay | Admitting: Emergency Medicine

## 2010-06-18 ENCOUNTER — Ambulatory Visit (HOSPITAL_BASED_OUTPATIENT_CLINIC_OR_DEPARTMENT_OTHER): Admission: RE | Admit: 2010-06-18 | Discharge: 2010-06-18 | Payer: Self-pay | Admitting: General Surgery

## 2010-07-01 ENCOUNTER — Ambulatory Visit: Payer: Self-pay | Admitting: Family Medicine

## 2010-07-01 DIAGNOSIS — N390 Urinary tract infection, site not specified: Secondary | ICD-10-CM | POA: Insufficient documentation

## 2010-07-01 LAB — CONVERTED CEMR LAB
Bilirubin Urine: NEGATIVE
Glucose, Urine, Semiquant: NEGATIVE
Ketones, urine, test strip: NEGATIVE
Nitrite: POSITIVE
Specific Gravity, Urine: 1.015
Urobilinogen, UA: 0.2
pH: 6

## 2010-07-02 ENCOUNTER — Telehealth: Payer: Self-pay | Admitting: Family Medicine

## 2010-07-02 ENCOUNTER — Encounter: Payer: Self-pay | Admitting: Family Medicine

## 2010-07-02 ENCOUNTER — Encounter: Payer: Self-pay | Admitting: Gastroenterology

## 2010-07-03 ENCOUNTER — Emergency Department (HOSPITAL_COMMUNITY): Admission: EM | Admit: 2010-07-03 | Discharge: 2010-07-03 | Payer: Self-pay | Admitting: Emergency Medicine

## 2010-08-08 ENCOUNTER — Ambulatory Visit: Payer: Self-pay | Admitting: Internal Medicine

## 2010-08-08 DIAGNOSIS — K219 Gastro-esophageal reflux disease without esophagitis: Secondary | ICD-10-CM | POA: Insufficient documentation

## 2010-08-08 DIAGNOSIS — R1032 Left lower quadrant pain: Secondary | ICD-10-CM | POA: Insufficient documentation

## 2010-08-20 ENCOUNTER — Ambulatory Visit: Payer: Self-pay | Admitting: Internal Medicine

## 2010-08-20 LAB — HM COLONOSCOPY

## 2010-08-23 ENCOUNTER — Ambulatory Visit: Payer: Self-pay | Admitting: Cardiovascular Disease

## 2010-08-23 ENCOUNTER — Encounter: Payer: Self-pay | Admitting: Internal Medicine

## 2010-10-01 ENCOUNTER — Ambulatory Visit: Admit: 2010-10-01 | Payer: Self-pay | Admitting: Internal Medicine

## 2010-10-22 NOTE — Progress Notes (Signed)
Summary: Alternate Rx for Diovan  Phone Note Call from Patient Call back at Home Phone [phone]   Caller: Patient Call For: Clent Ridges Summary of Call: Pt called to let Dr Clent Ridges know that the Diovan BP med co-pay $ has gone way up.  Requesting an alternate, please choose best for her from Benicar, Cozaar or Micardis.  [facility] [address] Initial call taken by: Sid Falcon LPN,  October 27, 2007 2:53 PM  Follow-up for Phone Call        stop Diovan and switch to Benicar 40 mg once daily . Call in #30 with 11 rf Follow-up by: Nelwyn Salisbury MD,  October 27, 2007 4:00 PM  Additional Follow-up for Phone Call Additional follow up Details #1::        Phone Call Completed, Rx Called In Additional Follow-up by: Alfred Levins, CMA,  October 27, 2007 4:05 PM    New/Updated Medications: BENICAR 40 MG  TABS (OLMESARTAN MEDOXOMIL) 1 by mouth once daily   Prescriptions: BENICAR 40 MG  TABS (OLMESARTAN MEDOXOMIL) 1 by mouth once daily  #30 x 11   Entered by:   Alfred Levins, CMA   Authorized by:   Nelwyn Salisbury MD   Signed by:   Alfred Levins, CMA on 10/27/2007   Method used:   Electronically sent to ...       [facility] [id number]       [address]       [place], [place]  [address]       Ph: [phone]       Fax: [phone]   RxID:   [id number]

## 2010-10-22 NOTE — Assessment & Plan Note (Signed)
Summary: back pain//ccm   Vital Signs:  Patient profile:   50 year old female Height:      67 inches Weight:      258 pounds BMI:     40.55 Temp:     98.0 degrees F oral Pulse rate:   86 / minute BP sitting:   152 / 74  (left arm) Cuff size:   large  Vitals Entered By: Alfred Levins, CMA (December 18, 2008 3:18 PM) CC: uti   History of Present Illness: Here for 2 problems. First for the past 4 weeks she has had swelling, warmth, and severe pain in the left knee. This is beyond the [place]; arthritic pain she usually experiences. No recent trauma. She has a hx of gout, but has not been treated for this for several years. Her usual pain meds do not help. Also for 2 weeks has had left flank pain, urgency to urinate, and burning on urinations. No fever. She is nauseated but has not vomitted.   Allergies: 1)  ! Codeine  Past History:  Past Medical History:    Reviewed history from 07/25/2008 and no changes required:    Osteoarthritis    Headache    Hypertension    Low back pain, sees Dr. Vear Clock for pain management  Past Surgical History:    Reviewed history from 05/12/2008 and no changes required:    Total knee replacement    Ganglion cyst removed from right wrist    Hysterectomy, laparoscopic 10-07 per Dr. Su Hilt    large lipoma removed from right hip at [facility] 6-09  Review of Systems  The patient denies anorexia, fever, weight loss, weight gain, vision loss, decreased hearing, hoarseness, chest pain, syncope, dyspnea on exertion, peripheral edema, prolonged cough, headaches, hemoptysis, melena, hematochezia, severe indigestion/heartburn, hematuria, incontinence, genital sores, muscle weakness, suspicious skin lesions, transient blindness, difficulty walking, depression, unusual weight change, abnormal bleeding, enlarged lymph nodes, angioedema, breast masses, and testicular masses.    Physical Exam  General:  in pain, walks with a cane Abdomen:  Bowel sounds positive,abdomen  soft and non-tender without masses, organomegaly or hernias noted. Extremities:  the left knee is quite tender in both joint spaces, I cannot appreciate any swelling since she is overweight. No warmth or  erythema.    Impression & Recommendations:  Problem # 1:  GOUT (ICD-274.9)  The following medications were removed from the medication list:    Indomethacin 50 Mg Caps (Indomethacin) .Marland Kitchen... Take 1 tablet by mouth three times a day Her updated medication list for this problem includes:    Indomethacin Cr 75 Mg Cr-caps (Indomethacin) .Marland Kitchen... Three times a day as needed gout  Problem # 2:  ACUTE CYSTITIS (ICD-595.0)  Her updated medication list for this problem includes:    Ciprofloxacin Hcl 500 Mg Tabs (Ciprofloxacin hcl) .Marland Kitchen..Marland Kitchen Two times a day  Orders: UA Dipstick w/o Micro (manual) ([address])  Complete Medication List: 1)  Metoprolol Tartrate 50 Mg Tabs (Metoprolol tartrate) .... Two times a day 2)  Benicar 40 Mg Tabs (Olmesartan medoxomil) .Marland Kitchen.. 1 by mouth once daily 3)  Dolophine 5 Mg Tabs (Methadone hcl) .Marland Kitchen.. 1 by mouth once daily 4)  Xanax 0.5 Mg Tabs (Alprazolam) .Marland Kitchen.. 1 by mouth at bedtime 5)  Promethazine Hcl 25 Mg Tabs (Promethazine hcl) .Marland Kitchen.. 1 by mouth every 4 hours as needed for nausea 6)  Dicyclomine Hcl 20 Mg Tabs (Dicyclomine hcl) .... Three times a day as needed cramps 7)  Norvasc 5 Mg Tabs (Amlodipine besylate) .Marland KitchenMarland KitchenMarland Kitchen  Two times a day 8)  Furosemide 40 Mg Tabs (Furosemide) .... Once daily 9)  Klor-con M20 20 Meq Cr-tabs (Potassium chloride crys cr) .... Qd 10)  Indomethacin Cr 75 Mg Cr-caps (Indomethacin) .... Three times a day as needed gout 11)  Ciprofloxacin Hcl 500 Mg Tabs (Ciprofloxacin hcl) .... Two times a day  Patient Instructions: 1)  Drink as much fluid as you can tolerate for the next few days.  2)  Please schedule a follow-up appointment as needed .  Prescriptions: CIPROFLOXACIN HCL 500 MG TABS (CIPROFLOXACIN HCL) two times a day  #14 x 0   Entered and  Authorized by:   Nelwyn Salisbury MD   Signed by:   Nelwyn Salisbury MD on 12/18/2008   Method used:   Electronically to        [facility] [id number] (retail)       [address]       [place], [place]  [address]       Ph: [phone]       Fax: [phone]   RxID:   [id number] INDOMETHACIN CR 75 MG CR-CAPS (INDOMETHACIN) three times a day as needed gout  #60 x 5   Entered and Authorized by:   Nelwyn Salisbury MD   Signed by:   Nelwyn Salisbury MD on 12/18/2008   Method used:   Electronically to        [facility] [id number] (retail)       [address]       [place], [place]  [address]       Ph: [phone]       Fax: [phone]   RxID:   [id number]   Laboratory Results   Urine Tests  Date/Time Received: December 18, 2008 3:26 PM Date/Time Reported: December 18, 2008 3:26 PM  Routine Urinalysis   Color: yellow Appearance: Clear Glucose: negative   (Normal Range: Negative) Bilirubin: negative   (Normal Range: Negative) Ketone: negative   (Normal Range: Negative) Spec. Gravity: 1.010   (Normal Range: 1.003-1.035) Blood: moderate   (Normal Range: Negative) pH: 6.5   (Normal Range: 5.0-8.0) Protein: negative   (Normal Range: Negative) Urobilinogen: 0.2   (Normal Range: 0-1) Nitrite: negative   (Normal Range: Negative) Leukocyte Esterace: negative   (Normal Range: Negative)    Comments: Alfred Levins, CMA  December 18, 2008 3:28 PM

## 2010-10-22 NOTE — Assessment & Plan Note (Signed)
Summary: Hives/hands and feet sweeling/cjr   Vital Signs:  Patient profile:   50 year old female Weight:      257 pounds Temp:     98.0 degrees F oral Pulse rate:   94 / minute BP sitting:   132 / 98  (left arm) Cuff size:   large  Vitals Entered By: Alfred Levins, CMA (February 26, 2009 10:35 AM) CC: itchy rash all over, hand and feet swollen   History of Present Illness: She is here for 2 days of an itchy rash over the trunk, arms, and legs along woth some painful swelling in the hands and feet. No swelling in the face or tongue, no SOB. Using Benadryl. She was here about 3 weeks ago for a UTI and was given Bactrim, which she took for 10 days. The UTI symptoms are totally gone now.   Allergies: 1)  ! Codeine 2)  ! Sulfa  Past History:  Past Medical History: Reviewed history from 07/25/2008 and no changes required. Osteoarthritis Headache Hypertension Low back pain, sees Dr. Vear Clock for pain management  Review of Systems  The patient denies anorexia, fever, weight loss, weight gain, vision loss, decreased hearing, hoarseness, chest pain, syncope, dyspnea on exertion, peripheral edema, prolonged cough, headaches, hemoptysis, abdominal pain, melena, hematochezia, severe indigestion/heartburn, hematuria, incontinence, genital sores, muscle weakness, suspicious skin lesions, transient blindness, difficulty walking, depression, unusual weight change, abnormal bleeding, enlarged lymph nodes, angioedema, breast masses, and testicular masses.    Physical Exam  General:  overweight-appearing.  alert Mouth:  Oral mucosa and oropharynx without lesions or exudates.  Teeth in good repair. Neck:  No deformities, masses, or tenderness noted. Lungs:  Normal respiratory effort, chest expands symmetrically. Lungs are clear to auscultation, no crackles or wheezes. Heart:  Normal rate and regular rhythm. S1 and S2 normal without gallop, murmur, click, rub or other extra sounds. Skin:  hands and  feet are red and swollen. urticaria present over arms, legs, and trunk.    Impression & Recommendations:  Problem # 1:  URTICARIA (ICD-708.9)  Orders: Depo- Medrol 80mg  (J1040) Admin of Therapeutic Inj  intramuscular or subcutaneous ([address])  Complete Medication List: 1)  Metoprolol Tartrate 50 Mg Tabs (Metoprolol tartrate) .... Two times a day 2)  Benicar 40 Mg Tabs (Olmesartan medoxomil) .Marland Kitchen.. 1 by mouth once daily 3)  Dolophine 5 Mg Tabs (Methadone hcl) .Marland Kitchen.. 1 by mouth once daily 4)  Norvasc 5 Mg Tabs (Amlodipine besylate) .... Two times a day 5)  Furosemide 40 Mg Tabs (Furosemide) .... Once daily 6)  Klor-con M20 20 Meq Cr-tabs (Potassium chloride crys cr) .... Qd 7)  Alprazolam 0.5 Mg Tabs (Alprazolam) .... At bedtime 8)  Prednisone (pak) 10 Mg Tabs (Prednisone) .... As directed for 12 days  Patient Instructions: 1)  Continue Benadryl. Given a steroid shot, and she will begin a steroid taper tomorrow. This is likely an allergic reaction to Bactrim, so she will avoid any sulfa based meds in the future.  Prescriptions: PREDNISONE (PAK) 10 MG TABS (PREDNISONE) as directed for 12 days  #1 x 0   Entered and Authorized by:   Nelwyn Salisbury MD   Signed by:   Nelwyn Salisbury MD on 02/26/2009   Method used:   Electronically to        [facility] [address] (retail)       [address]       [place],  [place]  [address]       Ph: [phone]       Fax: [phone]   RxID:   [id number]    Medication Administration  Injection # 1:    Medication: Depo- Medrol 80mg     Diagnosis: URTICARIA (ICD-708.9)    Route: IM    Site: LUOQ gluteus    Exp Date: 5/11    Lot #: [id number]    Mfr: Teva    Comments: 160mg     Patient tolerated injection without complications    Given by: Alfred Levins, CMA (February 26, 2009 11:12 AM)  Orders Added: 1)  Est. Patient Level IV [10272] 2)  Depo- Medrol 80mg  [J1040] 3)  Admin of Therapeutic Inj  intramuscular or subcutaneous  [53664]

## 2010-10-22 NOTE — Consult Note (Signed)
Summary: [facility] / Zachery Dakins, MD  [facility] / Zachery Dakins, MD   Imported By: Lenard Forth 11/20/2007 10:46:26  _____________________________________________________________________  External Attachment:    Type:   Image     Comment:   External Document

## 2010-10-22 NOTE — Assessment & Plan Note (Signed)
Summary: er fup-sopt on kidneys//ccm   Vital Signs:  Patient profile:   50 year old female Weight:      257 pounds BMI:     40.40 Temp:     98.3 degrees F oral BP sitting:   140 / 86  (left arm) Cuff size:   large  Vitals Entered By: Raechel Ache, RN (Jan 21, 2010 2:08 PM) CC: ER f/u for L flank pain and nausea. Still has pain and nausea.   History of Present Illness: Here to follow up on an ER visit in 01-19-10 for lower abdominal pain. On 01-14-10 she had a steroid injection in the left hip per Dr. Vear Clock. Then several days later she developed increased urgancy and frequency of urinations. Some nausea but no vomiting. No fevers. Her BMs have been normal. Then 3 days ago she developed sharp pains in both lower quadrants of the abdomen, both sides of the lower back, and over the pubis. While in the ER some blod wasfound in the urine but no bacteria. A non-contrasted CT of the abdomen and pelvis showed no possible causes of pain, but the small lesions in the left kidney seem to be a little larger than they appeared on a contrasted CT from 2008. They appeared to be simple cysts, but a follow up US was reccomended. Today the lower abdominal pains and urgency persist.   Allergies: 1)  ! Codeine 2)  ! Sulfa  Past History:  Past Medical History: Reviewed history from 07/25/2008 and no changes required. Osteoarthritis Headache Hypertension Low back pain, sees Dr. Vear Clock for pain management  Past Surgical History: Reviewed history from 07/30/2009 and no changes required. right total knee replacement per Dr. Ula Lingo Ganglion cyst removed from right wrist Hysterectomy, laparoscopic 10-07 per Dr. Su Hilt large lipoma removed from right hip at [facility] 6-09 left total knee replacement 06-25-09 per Dr. Merrilee Seashore at [facility]  Review of Systems  The patient denies anorexia, fever, weight loss, weight gain, vision loss, decreased hearing, hoarseness, chest pain, syncope, dyspnea on  exertion, peripheral edema, prolonged cough, headaches, hemoptysis, melena, hematochezia, severe indigestion/heartburn, hematuria, incontinence, genital sores, muscle weakness, suspicious skin lesions, transient blindness, difficulty walking, depression, unusual weight change, abnormal bleeding, enlarged lymph nodes, angioedema, breast masses, and testicular masses.    Physical Exam  General:  in pain, walks with a cane, alert Lungs:  Normal respiratory effort, chest expands symmetrically. Lungs are clear to auscultation, no crackles or wheezes. Heart:  Normal rate and regular rhythm. S1 and S2 normal without gallop, murmur, click, rub or other extra sounds. Abdomen:  soft, normal bowel sounds, no distention, no masses, no guarding, no rigidity, no rebound tenderness, no abdominal hernia, no inguinal hernia, no hepatomegaly, and no splenomegaly.  Moderately tender in both lower quadrants.   Impression & Recommendations:  Problem # 1:  ABDOMINAL PAIN, LOWER (ICD-789.09)  Her updated medication list for this problem includes:    Oxycodone Hcl 5 Mg Tabs (Oxycodone hcl) .Marland Kitchen... 1-2 every 3 hours as needed pain  Problem # 2:  UNSPECIFIED DISORDER OF KIDNEY AND URETER (ICD-593.9)  Problem # 3:  DYSURIA (ICD-788.1)  Her updated medication list for this problem includes:    Ciprofloxacin Hcl 500 Mg Tabs (Ciprofloxacin hcl) .Marland Kitchen..Marland Kitchen Two times a day  Orders: UA Dipstick w/o Micro (manual) ([address]) T-Culture, Urine ([id number])  Complete Medication List: 1)  Metoprolol Tartrate 50 Mg Tabs (Metoprolol tartrate) .... Two times a day 2)  Norvasc 5 Mg Tabs (Amlodipine besylate) .Marland KitchenMarland KitchenMarland Kitchen  Two times a day 3)  Furosemide 40 Mg Tabs (Furosemide) .... Once daily 4)  Alprazolam 0.5 Mg Tabs (Alprazolam) .... At bedtime 5)  Prilosec 20 Mg Cpdr (Omeprazole) .... One by mouth daily 6)  Oxycodone Hcl 5 Mg Tabs (Oxycodone hcl) .Marland Kitchen.. 1-2 every 3 hours as needed pain 7)  Ciprofloxacin Hcl 500 Mg Tabs (Ciprofloxacin hcl)  .... Two times a day  Patient Instructions: 1)  This is consistent with a UTI, so we will treat with Cipro. It is also possibly due to diverticulitis, which would be covered by Cipro as well. We will culture the urine. Set up a renal US to evaluate possible cysts as above.  Prescriptions: CIPROFLOXACIN HCL 500 MG TABS (CIPROFLOXACIN HCL) two times a day  #20 x 0   Entered and Authorized by:   Nelwyn Salisbury MD   Signed by:   Nelwyn Salisbury MD on 01/21/2010   Method used:   Electronically to        [facility] [address] (retail)       [address] [place], [place]  [address]       Ph: [phone]       Fax: [phone]   RxID:   [id number]   Laboratory Results   Urine Tests    Routine Urinalysis   Color: lt. yellow Appearance: Clear Glucose: negative   (Normal Range: Negative) Bilirubin: negative   (Normal Range: Negative) Ketone: negative   (Normal Range: Negative) Spec. Gravity: <1.005   (Normal Range: 1.003-1.035) Blood: trace-lysed   (Normal Range: Negative) pH: 6.0   (Normal Range: 5.0-8.0) Protein: trace   (Normal Range: Negative) Urobilinogen: 0.2   (Normal Range: 0-1) Nitrite: negative   (Normal Range: Negative) Leukocyte Esterace: negative   (Normal Range: Negative)

## 2010-10-22 NOTE — Assessment & Plan Note (Signed)
Summary: FUP ON SHOULDER/CCM rsc per pt/njr   Vital Signs:  Patient Profile:   50 Years Old Female Weight:      245 pounds (111.36 kg) Temp:     98.5 degrees F (36.94 degrees C) oral Pulse rate:   83 / minute BP sitting:   150 / 98  (left arm)  Vitals Entered By: Alfred Levins, CMA (June 07, 2007 1:11 PM)                 Chief Complaint:  rt shoulder pain.  History of Present Illness: For the past week she has had severe pain in right shoulder. Has had pain here a long time but never this bad. No hx of trauma. On Methadone and Indocin with no relief.  Current Allergies: ! CODEINE  Past Medical History:    Reviewed history from 03/15/2007 and no changes required:       Osteoarthritis       Headache       Hypertension     Review of Systems      See HPI   Physical Exam  General:     overweight-appearing.   Msk:     right shoulder is quite tender all the way around , has very limited ROM both actively and passively. No crepitus.    Impression & Recommendations:  Problem # 1:  SHOULDER PAIN (ICD-719.41)  Her updated medication list for this problem includes:    Indomethacin 50 Mg Caps (Indomethacin) .Marland Kitchen... Take 1 tablet by mouth three times a day    Dolophine 5 Mg Tabs (Methadone hcl) .Marland Kitchen... 1 by mouth once daily    Aspir-low 81 Mg Tbec (Aspirin) .Marland Kitchen... 1 by mouth once daily  Orders: Orthopedic Referral (Ortho)   Problem # 2:  HYPERTENSION (ICD-401.9)  The following medications were removed from the medication list:    Norvasc 5 Mg Tabs (Amlodipine besylate) .Marland Kitchen... Take 1 tablet by mouth once a day  Her updated medication list for this problem includes:    Metoprolol Tartrate 50 Mg Tabs (Metoprolol tartrate) .Marland Kitchen..Marland Kitchen Two times a day    Diovan 320 Mg Tabs (Valsartan) .Marland Kitchen... Take 1 tablet by mouth once a day    Norvasc 10 Mg Tabs (Amlodipine besylate) ..... Once daily   Complete Medication List: 1)  Metoprolol Tartrate 50 Mg Tabs (Metoprolol tartrate)  .... Two times a day 2)  Simvastatin 20 Mg Tabs (Simvastatin) .... Take 1 tablet by mouth at bedtime 3)  Ativan 1 Mg Tabs (Lorazepam) .... Take one tablet every six hours as needed 4)  Diovan 320 Mg Tabs (Valsartan) .... Take 1 tablet by mouth once a day 5)  Indomethacin 50 Mg Caps (Indomethacin) .... Take 1 tablet by mouth three times a day 6)  Dolophine 5 Mg Tabs (Methadone hcl) .Marland Kitchen.. 1 by mouth once daily 7)  Aspir-low 81 Mg Tbec (Aspirin) .Marland Kitchen.. 1 by mouth once daily 8)  Tobradex 0.3-0.1 % Susp (Tobramycin-dexamethasone) .... 2 drops in eye 4 times a day until clear 9)  Norvasc 10 Mg Tabs (Amlodipine besylate) .... Once daily   Patient Instructions: 1)  refer to [facility]    Prescriptions: NORVASC 10 MG  TABS (AMLODIPINE BESYLATE) once daily  #30 x 11   Entered and Authorized by:   Nelwyn Salisbury MD   Signed by:   Nelwyn Salisbury MD on 06/07/2007   Method used:   Electronically sent to ...       [facility] [address]       [address] [place], [place]  [address]       Ph: [phone]       Fax: [phone]   RxID:   [id number]  ]

## 2010-10-22 NOTE — Letter (Signed)
Summary: New Patient letter  [facility]  [address] [place], [place] [address]   Phone: [phone]  Fax: [phone]       07/02/2010 MRN: [id number]  [name] [address] [place], [place]  [address]  Dear [name],  Welcome to the Gastroenterology Division at [facility].    You are scheduled to see Dr.  Marina Goodell  on 08-08-10 at 9:30am on the 3rd floor at [facility], [address]  We ask that you try to arrive at our office 15 minutes prior to your appointment time to allow for check-in.  We would like you to complete the enclosed self-administered evaluation form prior to your visit and bring it with you on the day of your appointment.  We will review it with you.  Also, please bring a complete list of all your medications or, if you prefer, bring the medication bottles and we will list them.  Please bring your insurance card so that we may make a copy of it.  If your insurance requires a referral to see a specialist, please bring your referral form from your primary care physician.  Co-payments are due at the time of your visit and may be paid by cash, check or credit card.     Your office visit will consist of a consult with your physician (includes a physical exam), any laboratory testing he/she may order, scheduling of any necessary diagnostic testing (e.g. x-ray, ultrasound, CT-scan), and scheduling of a procedure (e.g. Endoscopy, Colonoscopy) if required.  Please allow enough time on your schedule to allow for any/all of these possibilities.    If you cannot keep your appointment, please call [phone] to cancel or reschedule prior to your appointment date.  This allows [place] the opportunity to schedule an appointment for another patient in need of care.  If you do not cancel or reschedule by 5 p.m. the business day prior to your appointment date, you will be charged a $50.00 late cancellation/no-show fee.    Thank you for choosing  [facility] for your medical needs.  We appreciate the opportunity to care for you.  Please visit [place] at our website  to learn more about our practice.                     Sincerely,                                                             The Gastroenterology Division

## 2010-10-22 NOTE — Progress Notes (Signed)
Summary: refill  Phone Note From Pharmacy Call back at [phone]   Caller: [facility] [address] Call For: Shelaine Frie  Summary of Call: lorazepam 1 mg tab #60 take 1 tab every  hrs as needed for anxiety last filled 05-06-07 Initial call taken by: Barnie Mort,  June 18, 2007 9:02 AM  Follow-up for Phone Call        call in lorazepam 1 mg every 6 hours as needed anxiety, #60 with 5 rf Follow-up by: Nelwyn Salisbury MD,  June 18, 2007 11:07 AM  Additional Follow-up for Phone Call Additional follow up Details #1::        Phone call completed, Pharmacist called Additional Follow-up by: Alfred Levins, CMA,  June 18, 2007 11:36 AM

## 2010-10-22 NOTE — Progress Notes (Signed)
Summary: ?change cipro   Phone Note From Pharmacy   Caller: [facility] [address] [phone] Summary of Call: per pharmascist cipro interacts with methadone ,it causes QT prolongation. pls advise.  Initial call taken by: Pura Spice, RN,  July 02, 2010 10:45 AM  Follow-up for Phone Call        the risk of this is quite minimal. It is important that she take the Cipro as prescribed  Follow-up by: Nelwyn Salisbury MD,  July 02, 2010 11:11 AM  Additional Follow-up for Phone Call Additional follow up Details #1::        [facility] notified [facility] and informed of above  Additional Follow-up by: Pura Spice, RN,  July 02, 2010 11:13 AM

## 2010-10-22 NOTE — Miscellaneous (Signed)
Summary: CT Abdomen/pelvis with contrast order  Clinical Lists Changes  Orders: Added new Referral order of CT Abdomen/Pelvis with Contrast (CT Abd/Pelvis w/con) - Signed

## 2010-10-22 NOTE — Progress Notes (Signed)
Summary: interaction  Phone Note From Pharmacy   Caller: [facility], [phone] Call For: [name]  Summary of Call: Concerning Cipro interactions:  Norvasc, a calcium channel blocker & Methadone that increases QT interval heart.  Cipro also blocks QT interval.  Computer red flags.  Give this or sub different antibiotic? Initial call taken by: Rudy Jew, RN,  May 12, 2008 1:57 PM  Follow-up for Phone Call        go ahead and give her the Cipro, thanks Follow-up by: Nelwyn Salisbury MD,  May 12, 2008 3:37 PM  Additional Follow-up for Phone Call Additional follow up Details #1::        Pharmacist aware. Additional Follow-up by: Rudy Jew, RN,  May 12, 2008 3:45 PM

## 2010-10-22 NOTE — Progress Notes (Signed)
Summary: Pt req ultrasound results  Phone Note Call from Patient Call back at [phone] after 10am today.    Caller: Patient Summary of Call: Pt called and is req ultrasound results. Please call pt at [phone] after 10am today.  Initial call taken by: Lucy Antigua,  Jan 25, 2010 9:17 AM  Follow-up for Phone Call        Phone Call Completed Follow-up by: Raechel Ache, RN,  Jan 25, 2010 10:09 AM

## 2010-10-22 NOTE — Progress Notes (Signed)
  Phone Note From Pharmacy   Caller: [facility] [address]* Call For: Shiraz Bastyr   Follow-up for Phone Call        PLS CALL PHARMACY NEED CLARIFICATIONS ON EYE DROPS Follow-up by: Shan Levans,  May 20, 2007 1:48 PM  Additional Follow-up for Phone Call Additional follow up Details #1::        Phone call completed, Pharmacist called Additional Follow-up by: Alfred Levins, CMA,  May 25, 2007 9:52 AM

## 2010-10-22 NOTE — Letter (Signed)
Summary: Appt Reminder 2  [facility]  [address] [place], [place] [address]   Phone: [phone]  Fax: [phone]        August 20, 2010 MRN: [id number]    [facility] [address] [place], [place]  [address]    Dear Ms. Slater,   You have a return appointment with Dr. Marina Goodell on 09/02/10 at 10:15 am.  Please remember to bring a complete list of the medicines you are taking, your insurance card and your co-pay.  If you have to cancel or reschedule this appointment, please call before 5:00 pm the evening before to avoid a cancellation fee.  If you have any questions or concerns, please call [phone].    Sincerely,    Milford Cage NCMA

## 2010-10-22 NOTE — Assessment & Plan Note (Signed)
Summary: ? kidney infection//ccm   Vital Signs:  Patient profile:   50 year old female Height:      67 inches Weight:      258 pounds Temp:     98.3 degrees F oral BP sitting:   130 / 90  (left arm) Cuff size:   large  Vitals Entered By: Kern Reap CMA (Feb 05, 2009 9:43 AM)  Reason for Visit HA, back pain , dysuria  History of Present Illness: For 4 days has had blood in the urine and burning on urination. No fever or vomiting. Drinking water and cranberry juice.  Allergies: 1)  ! Codeine  Past History:  Past Medical History:    Reviewed history from 07/25/2008 and no changes required:    Osteoarthritis    Headache    Hypertension    Low back pain, sees Dr. Vear Clock for pain management  Review of Systems  The patient denies anorexia, fever, weight loss, weight gain, vision loss, decreased hearing, hoarseness, chest pain, syncope, dyspnea on exertion, peripheral edema, prolonged cough, headaches, hemoptysis, melena, hematochezia, severe indigestion/heartburn, hematuria, incontinence, genital sores, muscle weakness, suspicious skin lesions, transient blindness, difficulty walking, depression, unusual weight change, abnormal bleeding, enlarged lymph nodes, angioedema, breast masses, and testicular masses.    Physical Exam  General:  Well-developed,well-nourished,in no acute distress; alert,appropriate and cooperative throughout examination Abdomen:  Bowel sounds positive,abdomen soft and non-tender without masses, organomegaly or hernias noted.   Impression & Recommendations:  Problem # 1:  ACUTE CYSTITIS (ICD-595.0)  Her updated medication list for this problem includes:    Bactrim Ds 800-160 Mg Tabs (Sulfamethoxazole-trimethoprim) .Marland Kitchen..Marland Kitchen Two times a day  Complete Medication List: 1)  Metoprolol Tartrate 50 Mg Tabs (Metoprolol tartrate) .... Two times a day 2)  Benicar 40 Mg Tabs (Olmesartan medoxomil) .Marland Kitchen.. 1 by mouth once daily 3)  Dolophine 5 Mg Tabs (Methadone  hcl) .Marland Kitchen.. 1 by mouth once daily 4)  Norvasc 5 Mg Tabs (Amlodipine besylate) .... Two times a day 5)  Furosemide 40 Mg Tabs (Furosemide) .... Once daily 6)  Klor-con M20 20 Meq Cr-tabs (Potassium chloride crys cr) .... Qd 7)  Bactrim Ds 800-160 Mg Tabs (Sulfamethoxazole-trimethoprim) .... Two times a day 8)  Alprazolam 0.5 Mg Tabs (Alprazolam) .... At bedtime  Other Orders: UA Dipstick w/o Micro (manual) (06237)  Patient Instructions: 1)  Please schedule a follow-up appointment as needed .  Prescriptions: ALPRAZOLAM 0.5 MG TABS (ALPRAZOLAM) at bedtime  #30 x 5   Entered and Authorized by:   Nelwyn Salisbury MD   Signed by:   Nelwyn Salisbury MD on 02/05/2009   Method used:   Print then Give to Patient   RxID:   [id number] BACTRIM DS 800-160 MG TABS (SULFAMETHOXAZOLE-TRIMETHOPRIM) two times a day  #20 x 0   Entered and Authorized by:   Nelwyn Salisbury MD   Signed by:   Nelwyn Salisbury MD on 02/05/2009   Method used:   Print then Give to Patient   RxID:   [id number]   Laboratory Results   Urine Tests  Date/Time Received: Feb 05, 2009 9:48 AM   Routine Urinalysis   Color: yellow Appearance: Hazy Glucose: negative   (Normal Range: Negative) Bilirubin: negative   (Normal Range: Negative) Ketone: negative   (Normal Range: Negative) Spec. Gravity: 1.015   (Normal Range: 1.003-1.035) Blood: large   (Normal Range: Negative) pH: 6.5   (Normal Range: 5.0-8.0) Protein: trace   (Normal Range: Negative) Urobilinogen: 0.2   (  Normal Range: 0-1) Nitrite: negative   (Normal Range: Negative) Leukocyte Esterace: negative   (Normal Range: Negative)    Comments: Kern Reap CMA  Feb 05, 2009 9:49 AM

## 2010-10-22 NOTE — Progress Notes (Signed)
Summary: lab results   Phone Note Call from Patient   Caller: pt live Call For: Clent Ridges  Summary of Call: call [phone] with lab results/ Initial call taken by: Roselle Locus,  July 27, 2008 12:32 PM  Follow-up for Phone Call        this was done Follow-up by: Nelwyn Salisbury MD,  July 31, 2008 9:07 AM

## 2010-10-22 NOTE — Assessment & Plan Note (Signed)
Summary: potastium is low,leg cramps/jls   Vital Signs:  Patient Profile:   50 Years Old Female Weight:      249 pounds Temp:     98.4 degrees F oral Pulse rate:   93 / minute BP sitting:   132 / 84  (left arm) Cuff size:   large  Vitals Entered By: Alfred Levins, CMA (July 25, 2008 11:10 AM)                 Chief Complaint:  leg cramps x2 wks.  History of Present Illness: For the past 2 weeks she has had a lot of swelling in the hands and feet, also leg cramps. No recent change in her meds. No SOB.     Current Allergies: ! CODEINE  Past Medical History:    Reviewed history from 03/15/2007 and no changes required:       Osteoarthritis       Headache       Hypertension       Low back pain, sees Dr. Vear Clock for pain management     Review of Systems  The patient denies anorexia, fever, weight loss, weight gain, vision loss, decreased hearing, hoarseness, chest pain, syncope, dyspnea on exertion, prolonged cough, headaches, hemoptysis, abdominal pain, melena, hematochezia, severe indigestion/heartburn, hematuria, incontinence, genital sores, muscle weakness, suspicious skin lesions, transient blindness, difficulty walking, depression, unusual weight change, abnormal bleeding, enlarged lymph nodes, angioedema, breast masses, and testicular masses.     Physical Exam  General:     overweight-appearing.   Neck:     No deformities, masses, or tenderness noted. Lungs:     Normal respiratory effort, chest expands symmetrically. Lungs are clear to auscultation, no crackles or wheezes. Heart:     Normal rate and regular rhythm. S1 and S2 normal without gallop, murmur, click, rub or other extra sounds. Extremities:     3+ edema to both hands and both feet    Impression & Recommendations:  Problem # 1:  HYPERTENSION (ICD-401.9)  The following medications were removed from the medication list:    Norvasc 10 Mg Tabs (Amlodipine besylate) ..... Once daily  Her  updated medication list for this problem includes:    Metoprolol Tartrate 50 Mg Tabs (Metoprolol tartrate) .Marland Kitchen..Marland Kitchen Two times a day    Benicar 40 Mg Tabs (Olmesartan medoxomil) .Marland Kitchen... 1 by mouth once daily    Norvasc 5 Mg Tabs (Amlodipine besylate) ..... Once daily    Furosemide 40 Mg Tabs (Furosemide) ..... Once daily  Orders: Venipuncture (11914) TLB-BMP (Basic Metabolic Panel-BMET) (80048-METABOL) TLB-CBC Platelet - w/Differential (85025-CBCD) TLB-Hepatic/Liver Function Pnl (80076-HEPATIC) TLB-TSH (Thyroid Stimulating Hormone) (84443-TSH)   Problem # 2:  PERIPHERAL EDEMA (ICD-782.3)  Her updated medication list for this problem includes:    Furosemide 40 Mg Tabs (Furosemide) ..... Once daily   Problem # 3:  LEG CRAMPS (ICD-729.82)  Complete Medication List: 1)  Metoprolol Tartrate 50 Mg Tabs (Metoprolol tartrate) .... Two times a day 2)  Benicar 40 Mg Tabs (Olmesartan medoxomil) .Marland Kitchen.. 1 by mouth once daily 3)  Indomethacin 50 Mg Caps (Indomethacin) .... Take 1 tablet by mouth three times a day 4)  Dolophine 5 Mg Tabs (Methadone hcl) .Marland Kitchen.. 1 by mouth once daily 5)  Xanax 0.5 Mg Tabs (Alprazolam) .Marland Kitchen.. 1 by mouth at bedtime 6)  Promethazine Hcl 25 Mg Tabs (Promethazine hcl) .Marland Kitchen.. 1 by mouth every 4 hours as needed for nausea 7)  Dicyclomine Hcl 20 Mg Tabs (Dicyclomine hcl) .... Three times  a day as needed cramps 8)  Norvasc 5 Mg Tabs (Amlodipine besylate) .... Once daily 9)  Furosemide 40 Mg Tabs (Furosemide) .... Once daily   Patient Instructions: 1)  Check labs, inlcuding her potassium. We will spupplement this if it is low. Her edema may be from the Norvasc. We will decrease the dose of this and add Lasix.  2)  Please schedule a follow-up appointment in 1 month.   Prescriptions: FUROSEMIDE 40 MG TABS (FUROSEMIDE) once daily  #30 x 11   Entered and Authorized by:   Nelwyn Salisbury MD   Signed by:   Nelwyn Salisbury MD on 07/25/2008   Method used:   Electronically to        [facility]  [id number] (retail)       [address] [place], [place]  [address]       Ph: [phone]       Fax: [phone]   RxID:   [id number] NORVASC 5 MG TABS (AMLODIPINE BESYLATE) once daily  #30 x 11   Entered and Authorized by:   Nelwyn Salisbury MD   Signed by:   Nelwyn Salisbury MD on 07/25/2008   Method used:   Electronically to        [facility] [id number] (retail)       [address]       [place], [place]  [address]       Ph: [phone]       Fax: [phone]   RxID:   [id number]  ]

## 2010-10-22 NOTE — Letter (Signed)
Summary: [facility] Instructions  [facility]  [address] [place], [place] [address]   Phone: [phone]  Fax: [phone]       [name]    June 23, 1961    MRN: [id number]        Procedure Day /Date:TUESDAY 08/20/10     Arrival Time:10:30 AM     Procedure Time:11:30 AM     Location of Procedure:                    X  [facility] (4th Floor)                        PREPARATION FOR COLONOSCOPY WITH MOVIPREP   Starting 5 days prior to your procedure 08/15/10 do not eat nuts, seeds, popcorn, corn, beans, peas,  salads, or any raw vegetables.  Do not take any fiber supplements (e.g. Metamucil, Citrucel, and Benefiber).  THE DAY BEFORE YOUR PROCEDURE         DATE:08/19/10 DAY: _MONDAY  1.  Drink clear liquids the entire day-NO SOLID FOOD  2.  Do not drink anything colored red or purple.  Avoid juices with pulp.  No orange juice.  3.  Drink at least 64 oz. (8 glasses) of fluid/clear liquids during the day to prevent dehydration and help the prep work efficiently.  CLEAR LIQUIDS INCLUDE: Water Jello Ice Popsicles Tea (sugar ok, no milk/cream) Powdered fruit flavored drinks Coffee (sugar ok, no milk/cream) Gatorade Juice: apple, white grape, white cranberry  Lemonade Clear bullion, consomm, broth Carbonated beverages (any kind) Strained chicken noodle soup Hard Candy                             4.  In the morning, mix first dose of MoviPrep solution:    Empty 1 Pouch A and 1 Pouch B into the disposable container    Add lukewarm drinking water to the top line of the container. Mix to dissolve    Refrigerate (mixed solution should be used within 24 hrs)  5.  Begin drinking the prep at 5:00 p.m. The MoviPrep container is divided by 4 marks.   Every 15 minutes drink the solution down to the next mark (approximately 8 oz) until the full liter is complete.   6.  Follow completed prep with 16 oz of clear liquid of your choice (Nothing red  or purple).  Continue to drink clear liquids until bedtime.  7.  Before going to bed, mix second dose of MoviPrep solution:    Empty 1 Pouch A and 1 Pouch B into the disposable container    Add lukewarm drinking water to the top line of the container. Mix to dissolve    Refrigerate  THE DAY OF YOUR PROCEDURE      DATE: 08/20/10 DAY: TUESDAY  Beginning at 6:30 a.m. (5 hours before procedure):         1. Every 15 minutes, drink the solution down to the next mark (approx 8 oz) until the full liter is complete.  2. Follow completed prep with 16 oz. of clear liquid of your choice.    3. You may drink clear liquids until 9:30 AM (2 HOURS BEFORE PROCEDURE).   MEDICATION INSTRUCTIONS  Unless otherwise instructed, you should take regular prescription medications with a small sip of water   as early as possible the morning of your procedure.  OTHER INSTRUCTIONS  You will need a responsible adult at least 51 years of age to accompany you and drive you home.   This person must remain in the waiting room during your procedure.  Wear loose fitting clothing that is easily removed.  Leave jewelry and other valuables at home.  However, you may wish to bring a book to read or  an iPod/MP3 player to listen to music as you wait for your procedure to start.  Remove all body piercing jewelry and leave at home.  Total time from sign-in until discharge is approximately 2-3 hours.  You should go home directly after your procedure and rest.  You can resume normal activities the  day after your procedure.  The day of your procedure you should not:   Drive   Make legal decisions   Operate machinery   Drink alcohol   Return to work  You will receive specific instructions about eating, activities and medications before you leave.    The above instructions have been reviewed and explained to me by   _______________________    I fully understand and can verbalize these  instructions _____________________________ Date _________   Appended Document: Colonoscopy     Procedures Next Due Date:    Colonoscopy: 07/2013

## 2010-10-22 NOTE — Assessment & Plan Note (Signed)
Summary: STOMACH PAIN/CCM   Vital Signs:  Patient Profile:   50 Years Old Female Weight:      244 pounds Temp:     98.2 degrees F oral BP sitting:   138 / 84  (left arm) Cuff size:   large  Vitals Entered By: Alfred Levins, CMA (August 30, 2007 8:50 AM)                 Chief Complaint:  stomach pain x .  History of Present Illness: For the past 5 months has had daily intermittent sharp well localized pains around the navel. No lumps or swelling. No heartburn or nausea. BM's normal. Had laparoscopic hysterectomy in this area in 10-07.   Current Allergies: ! CODEINE  Past Medical History:    Reviewed history from 03/15/2007 and no changes required:       Osteoarthritis       Headache       Hypertension  Past Surgical History:    Reviewed history from 03/15/2007 and no changes required:       Total knee replacement       Ganglion cyst removed from right wrist       Hysterectomy, laparoscopic 10-07 per Dr. Su Hilt     Review of Systems      See HPI   Physical Exam  General:     Well-developed,well-nourished,in no acute distress; alert,appropriate and cooperative throughout examination Abdomen:     soft, normal bowel sounds, no distention, no masses, no guarding, no rigidity, no rebound tenderness, and no abdominal hernia.  Mildly tender around umbilicus.    Impression & Recommendations:  Problem # 1:  ABDOMINAL PAIN, PERIUMBILIC (ICD-789.05)  Orders: Radiology Referral (Radiology)   Complete Medication List: 1)  Metoprolol Tartrate 50 Mg Tabs (Metoprolol tartrate) .... Two times a day 2)  Diovan 320 Mg Tabs (Valsartan) .... Take 1 tablet by mouth once a day 3)  Indomethacin 50 Mg Caps (Indomethacin) .... Take 1 tablet by mouth three times a day 4)  Dolophine 5 Mg Tabs (Methadone hcl) .Marland Kitchen.. 1 by mouth once daily 5)  Norvasc 10 Mg Tabs (Amlodipine besylate) .... Once daily 6)  Xanax 0.5 Mg Tabs (Alprazolam) .Marland Kitchen.. 1 by mouth at bedtime   Patient  Instructions: 1)  get CT scan. This is probably either a hernia or adhesions.    ]

## 2010-10-22 NOTE — Assessment & Plan Note (Signed)
Summary: painful urination//ccm   Vital Signs:  Patient profile:   50 year old female Temp:     98 degrees F oral Pulse rhythm:   regular Resp:     16 per minute BP sitting:   120 / 76  Vitals Entered By: Lynann Beaver CMA (July 09, 2009 10:13 AM) CC: ? UTI? Is Patient Diabetic? No Pain Assessment Patient in pain? yes        History of Present Illness: Her for 2 reasons. First, for 3 days she has had burning on urination and some blood in the urine. She jsut had a left TKR on 06-25-09 at [facility], and of course she was catheterized for a time. No fever. Second, she needs some more pain meds until she can meet with Dr. Vear Clock again on 07-19-09. Around the time of the surgery, her Orthopedist had her stop her usual pain meds (Methadone and Vicodin), and he has been giving her Oxycodone instead. She has run out, and the Orthopedist will not refill the meds.   Current Medications (verified): 1)  Metoprolol Tartrate 50 Mg Tabs (Metoprolol Tartrate) .... Two Times A Day 2)  Norvasc 5 Mg Tabs (Amlodipine Besylate) .... Two Times A Day 3)  Furosemide 40 Mg Tabs (Furosemide) .... Once Daily 4)  Alprazolam 0.5 Mg Tabs (Alprazolam) .... At Bedtime 5)  Prilosec 20 Mg Cpdr (Omeprazole) .... One By Mouth Daily 6)  Lovenox 30 Mg/0.32ml Soln (Enoxaparin Sodium) .... As Directed.  Allergies (verified): 1)  ! Codeine 2)  ! Sulfa  Past History:  Past Medical History: Reviewed history from 07/25/2008 and no changes required. Osteoarthritis Headache Hypertension Low back pain, sees Dr. Vear Clock for pain management  Past Surgical History: right total knee replacement Ganglion cyst removed from right wrist Hysterectomy, laparoscopic 10-07 per Dr. 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 Merrilee Seashore at [facility]  Review of Systems  The patient denies anorexia, fever, weight loss, weight gain, vision loss, decreased hearing,  hoarseness, chest pain, syncope, dyspnea on exertion, peripheral edema, prolonged cough, headaches, hemoptysis, abdominal pain, melena, hematochezia, severe indigestion/heartburn, hematuria, incontinence, genital sores, muscle weakness, suspicious skin lesions, transient blindness, difficulty walking, depression, unusual weight change, abnormal bleeding, enlarged lymph nodes, angioedema, breast masses, and testicular masses.    Physical Exam  General:  in a wheelchair Abdomen:  Bowel sounds positive,abdomen soft and non-tender without masses, organomegaly or hernias noted. Msk:  the surgical wound on the left knee looks clean, staples in place   Impression & Recommendations:  Problem # 1:  ACUTE CYSTITIS (ICD-595.0)  Her updated medication list for this problem includes:    Cipro 500 Mg Tabs (Ciprofloxacin hcl) .Marland Kitchen..Marland Kitchen Two times a day  Problem # 2:  KNEE PAIN (ICD-719.46)  The following medications were removed from the medication list:    Dolophine 5 Mg Tabs (Methadone hcl) .Marland Kitchen... 1 by mouth once daily Her updated medication list for this problem includes:    Oxycodone Hcl 5 Mg Tabs (Oxycodone hcl) .Marland Kitchen... 1-2 every 3 hours as needed pain  Complete Medication List: 1)  Metoprolol Tartrate 50 Mg Tabs (Metoprolol tartrate) .... Two times a day 2)  Norvasc 5 Mg Tabs (Amlodipine besylate) .... Two times a day 3)  Furosemide 40 Mg Tabs (Furosemide) .... Once daily 4)  Alprazolam 0.5 Mg Tabs (Alprazolam) .... At bedtime 5)  Prilosec 20 Mg Cpdr (Omeprazole) .... One by mouth daily 6)  Lovenox 30 Mg/0.47ml Soln (Enoxaparin sodium) .Marland KitchenMarland KitchenMarland Kitchen  40 mg once daily 7)  Cipro 500 Mg Tabs (Ciprofloxacin hcl) .... Two times a day 8)  Oxycodone Hcl 5 Mg Tabs (Oxycodone hcl) .Marland Kitchen.. 1-2 every 3 hours as needed pain  Other Orders: UA Dipstick w/o Micro (automated)  (81003)  Patient Instructions: 1)  We will give her some Oxycodone to cover until she meets with Dr. Vear Clock again.  Prescriptions: OXYCODONE HCL 5  MG TABS (OXYCODONE HCL) 1-2 every 3 hours as needed pain  #120 x 0   Entered by:   Nelwyn Salisbury MD   Authorized by:   Joanne Chars CMA   Signed by:   Nelwyn Salisbury MD on 07/09/2009   Method used:   Print then Give to Patient   RxID:   [id number] CIPRO 500 MG TABS (CIPROFLOXACIN HCL) two times a day  #14 x 0   Entered by:   Nelwyn Salisbury MD   Authorized by:   Joanne Chars CMA   Signed by:   Nelwyn Salisbury MD on 07/09/2009   Method used:   Print then Give to Patient   RxID:   [id number]   Laboratory Results   Urine Tests    Routine Urinalysis   Color: yellow Appearance: Clear Glucose: negative   (Normal Range: Negative) Bilirubin: negative   (Normal Range: Negative) Ketone: trace (5)   (Normal Range: Negative) Spec. Gravity: 1.015   (Normal Range: 1.003-1.035) Blood: 2+   (Normal Range: Negative) pH: 7.0   (Normal Range: 5.0-8.0) Protein: 1+   (Normal Range: Negative) Urobilinogen: 0.2   (Normal Range: 0-1) Nitrite: negative   (Normal Range: Negative) Leukocyte Esterace: negative   (Normal Range: Negative)    Comments: Joanne Chars CMA  July 09, 2009 10:59 AM

## 2010-10-22 NOTE — Assessment & Plan Note (Signed)
Summary: er for high blood pressure/mhf   Vital Signs:  Patient Profile:   50 Years Old Female Weight:      250 pounds Temp:     98.3 degrees F oral Pulse rate:   85 / minute BP sitting:   142 / 98  (left arm) Cuff size:   large  Vitals Entered By: Alfred Levins, CMA (September 01, 2008 9:02 AM)                 Chief Complaint:  Hypertension Management.  History of Present Illness: Here to recheck her BP. She has developed a pattern of having normal morning BP's but then they elevate in the evenings. On 08-31-08 her BP went up to 185/110 so she went to the ER. Her exam there was normal, labs were normal, etc. No changes in her meds were made. Today she feels fine.    Current Allergies: ! CODEINE  Past Medical History:    Reviewed history from 07/25/2008 and no changes required:       Osteoarthritis       Headache       Hypertension       Low back pain, sees Dr. Vear Clock for pain management     Review of Systems  The patient denies anorexia, fever, weight loss, weight gain, vision loss, decreased hearing, hoarseness, chest pain, syncope, dyspnea on exertion, peripheral edema, prolonged cough, headaches, hemoptysis, abdominal pain, melena, hematochezia, severe indigestion/heartburn, hematuria, incontinence, genital sores, muscle weakness, suspicious skin lesions, transient blindness, difficulty walking, depression, unusual weight change, abnormal bleeding, enlarged lymph nodes, angioedema, breast masses, and testicular masses.     Physical Exam  General:     overweight-appearing.   Neck:     No deformities, masses, or tenderness noted. Lungs:     Normal respiratory effort, chest expands symmetrically. Lungs are clear to auscultation, no crackles or wheezes. Heart:     Normal rate and regular rhythm. S1 and S2 normal without gallop, murmur, click, rub or other extra sounds.    Impression & Recommendations:  Problem # 1:  HYPERTENSION (ICD-401.9)  Her updated  medication list for this problem includes:    Metoprolol Tartrate 50 Mg Tabs (Metoprolol tartrate) .Marland Kitchen..Marland Kitchen Two times a day    Benicar 40 Mg Tabs (Olmesartan medoxomil) .Marland Kitchen... 1 by mouth once daily    Norvasc 5 Mg Tabs (Amlodipine besylate) .Marland Kitchen..Marland Kitchen Two times a day    Furosemide 40 Mg Tabs (Furosemide) ..... Once daily   Problem # 2:  PERIPHERAL EDEMA (ICD-782.3)  Her updated medication list for this problem includes:    Furosemide 40 Mg Tabs (Furosemide) ..... Once daily   Problem # 3:  CHEST PAIN (ICD-786.50)  Complete Medication List: 1)  Metoprolol Tartrate 50 Mg Tabs (Metoprolol tartrate) .... Two times a day 2)  Benicar 40 Mg Tabs (Olmesartan medoxomil) .Marland Kitchen.. 1 by mouth once daily 3)  Indomethacin 50 Mg Caps (Indomethacin) .... Take 1 tablet by mouth three times a day 4)  Dolophine 5 Mg Tabs (Methadone hcl) .Marland Kitchen.. 1 by mouth once daily 5)  Xanax 0.5 Mg Tabs (Alprazolam) .Marland Kitchen.. 1 by mouth at bedtime 6)  Promethazine Hcl 25 Mg Tabs (Promethazine hcl) .Marland Kitchen.. 1 by mouth every 4 hours as needed for nausea 7)  Dicyclomine Hcl 20 Mg Tabs (Dicyclomine hcl) .... Three times a day as needed cramps 8)  Norvasc 5 Mg Tabs (Amlodipine besylate) .... Two times a day 9)  Furosemide 40 Mg Tabs (Furosemide) .... Once daily 10)  Klor-con M20 20 Meq Cr-tabs (Potassium chloride crys cr) .... Qd   Patient Instructions: 1)  It seems that her morning meds may be running out by the evening, so we will increase Norvasc to two times a day. She will check her BP several times a day and see me in 2 weeks.   Prescriptions: NORVASC 5 MG TABS (AMLODIPINE BESYLATE) two times a day  #60 x 11   Entered and Authorized by:   Nelwyn Salisbury MD   Signed by:   Nelwyn Salisbury MD on 09/01/2008   Method used:   Electronically to        [facility] [id number] (retail)       [address] [place], [place]  [address]       Ph: [phone]       Fax: [phone]   RxID:   [id number]  ]

## 2010-10-22 NOTE — Assessment & Plan Note (Signed)
Summary: FLU-LIKE SYMPTOMS // RS   Vital Signs:  Patient profile:   50 year old female Weight:      255 pounds BMI:     40.08 Temp:     100.3 degrees F oral BP sitting:   132 / 84  (left arm) Cuff size:   large  Vitals Entered By: Alfred Levins, CMA (October 29, 2009 3:57 PM) CC: fever, achy x4 days   History of Present Illness: Here for 5 days of fevers, body aches, and coughing up green sputum. No NVD. On fluids.   Current Medications (verified): 1)  Metoprolol Tartrate 50 Mg Tabs (Metoprolol Tartrate) .... Two Times A Day 2)  Norvasc 5 Mg Tabs (Amlodipine Besylate) .... Two Times A Day 3)  Furosemide 40 Mg Tabs (Furosemide) .... Once Daily 4)  Alprazolam 0.5 Mg Tabs (Alprazolam) .... At Bedtime 5)  Prilosec 20 Mg Cpdr (Omeprazole) .... One By Mouth Daily 6)  Oxycodone Hcl 5 Mg Tabs (Oxycodone Hcl) .Marland Kitchen.. 1-2 Every 3 Hours As Needed Pain  Allergies (verified): 1)  ! Codeine 2)  ! Sulfa  Past History:  Past Medical History: Reviewed history from 07/25/2008 and no changes required. Osteoarthritis Headache Hypertension Low back pain, sees Dr. Vear Clock for pain management  Review of Systems  The patient denies anorexia, weight loss, weight gain, vision loss, decreased hearing, hoarseness, chest pain, syncope, dyspnea on exertion, peripheral edema, headaches, hemoptysis, abdominal pain, melena, hematochezia, severe indigestion/heartburn, hematuria, incontinence, genital sores, muscle weakness, suspicious skin lesions, transient blindness, difficulty walking, depression, unusual weight change, abnormal bleeding, enlarged lymph nodes, angioedema, breast masses, and testicular masses.    Physical Exam  General:  Well-developed,well-nourished,in no acute distress; alert,appropriate and cooperative throughout examination Head:  Normocephalic and atraumatic without obvious abnormalities. No apparent alopecia or balding. Eyes:  No corneal or conjunctival inflammation noted. EOMI.  Perrla. Funduscopic exam benign, without hemorrhages, exudates or papilledema. Vision grossly normal. Ears:  External ear exam shows no significant lesions or deformities.  Otoscopic examination reveals clear canals, tympanic membranes are intact bilaterally without bulging, retraction, inflammation or discharge. Hearing is grossly normal bilaterally. Nose:  External nasal examination shows no deformity or inflammation. Nasal mucosa are pink and moist without lesions or exudates. Mouth:  Oral mucosa and oropharynx without lesions or exudates.  Teeth in good repair. Neck:  No deformities, masses, or tenderness noted. Lungs:  scattered rhonchi   Impression & Recommendations:  Problem # 1:  ACUTE BRONCHITIS (ICD-466.0)  Her updated medication list for this problem includes:    Augmentin 875-125 Mg Tabs (Amoxicillin-pot clavulanate) .Marland Kitchen..Marland Kitchen Two times a day  Complete Medication List: 1)  Metoprolol Tartrate 50 Mg Tabs (Metoprolol tartrate) .... Two times a day 2)  Norvasc 5 Mg Tabs (Amlodipine besylate) .... Two times a day 3)  Furosemide 40 Mg Tabs (Furosemide) .... Once daily 4)  Alprazolam 0.5 Mg Tabs (Alprazolam) .... At bedtime 5)  Prilosec 20 Mg Cpdr (Omeprazole) .... One by mouth daily 6)  Oxycodone Hcl 5 Mg Tabs (Oxycodone hcl) .Marland Kitchen.. 1-2 every 3 hours as needed pain 7)  Augmentin 875-125 Mg Tabs (Amoxicillin-pot clavulanate) .... Two times a day  Patient Instructions: 1)  This likely a complication of influenza.  2)  Please schedule a follow-up appointment as needed .  Prescriptions: AUGMENTIN 875-125 MG TABS (AMOXICILLIN-POT CLAVULANATE) two times a day  #20 x 0   Entered and Authorized by:   Nelwyn Salisbury MD   Signed by:   Nelwyn Salisbury MD on  10/29/2009   Method used:   Electronically to        [facility] [id number] (retail)       [address] [place], [place]  [address]       Ph: [phone]       Fax: [phone]   RxID:   [id number]

## 2010-10-22 NOTE — Letter (Signed)
Summary: Patient Notice- Polyp Results  [facility]  [address] [place], [place] [address]   Phone: [phone]  Fax: [phone]        August 23, 2010 MRN: [id number]    [facility] [address] [place], [place]  [address]    Dear Ms. Mccorkel,  I am pleased to inform you that the colon polyp(s) removed during your recent colonoscopy was (were) found to be benign (no cancer detected) upon pathologic examination.  I recommend you have a repeat colonoscopy examination in 3 years to look for recurrent polyps, as having colon polyps increases your risk for having recurrent polyps or even colon cancer in the future.  Should you develop new or worsening symptoms of abdominal pain, bowel habit changes or bleeding from the rectum or bowels, please schedule an evaluation with either your primary care physician or with me.  Additional information/recommendations:  __ No further action with gastroenterology is needed at this time. Please      follow-up with your primary care physician for your other healthcare      needs.    Please call us if you are having persistent problems or have questions about your condition that have not been fully answered at this time.  Sincerely,  Hilarie Fredrickson MD  This letter has been electronically signed by your physician.  Appended Document: Patient Notice- Polyp Results Letter mailed

## 2010-10-22 NOTE — Progress Notes (Signed)
Summary: flu?  Phone Note Call from Patient   Caller: Patient Call For: Nelwyn Salisbury MD Reason for Call: Acute Illness Complaint: Cough/Sore throat, Headache Summary of Call: Pt states she has the flu with URI symptoms and awful cough.  Is using OTC cough meds with no relief.  Would like a RX called in to [facility])  Temp [phone] Initial call taken by: Lynann Beaver CMA,  October 29, 2009 9:05 AM  Follow-up for Phone Call        needs OV  Phone message box is full. Clifton Custard CMA Follow-up by: Nelwyn Salisbury MD,  October 29, 2009 10:21 AM  Additional Follow-up for Phone Call Additional follow up Details #1::        Phone Call Completed, Appt Scheduled Today Additional Follow-up by: Alfred Levins, CMA,  October 29, 2009 3:40 PM

## 2010-10-22 NOTE — Assessment & Plan Note (Signed)
Summary: bilateral leg pain/per Dr. Carys Malina/dm   Vital Signs:  Patient Profile:   50 Years Old Female Weight:      250 pounds Temp:     98.1 degrees F oral BP sitting:   132 / 84  (left arm) Cuff size:   large  Vitals Entered By: Alfred Levins, CMA (May 12, 2008 11:56 AM)                 Chief Complaint:  lower back pain & abd pain.  History of Present Illness: For 5 days has had lower back and lower abdominal pains. Had some vomiting yesterday. No fever. No change in BM's. Has frequency of urinations but no burning.     Current Allergies: ! CODEINE  Past Medical History:    Reviewed history from 03/15/2007 and no changes required:       Osteoarthritis       Headache       Hypertension  Past Surgical History:    Reviewed history from 08/30/2007 and no changes required:       Total knee replacement       Ganglion cyst removed from right wrist       Hysterectomy, laparoscopic 10-07 per Dr. Su Hilt       large lipoma removed from right hip at [facility] 6-09     Review of Systems      See HPI   Physical Exam  General:     Well-developed,well-nourished,in no acute distress; alert,appropriate and cooperative throughout examination Abdomen:     soft, normal bowel sounds, no distention, no masses, no guarding, no rigidity, no rebound tenderness, and no abdominal hernia.  Mildly tender in both lower quadrants    Impression & Recommendations:  Problem # 1:  DIVERTICULITIS, ACUTE (ICD-562.11)  Orders: UA Dipstick w/o Micro (manual) ([address])   Complete Medication List: 1)  Metoprolol Tartrate 50 Mg Tabs (Metoprolol tartrate) .... Two times a day 2)  Benicar 40 Mg Tabs (Olmesartan medoxomil) .Marland Kitchen.. 1 by mouth once daily 3)  Indomethacin 50 Mg Caps (Indomethacin) .... Take 1 tablet by mouth three times a day 4)  Dolophine 5 Mg Tabs (Methadone hcl) .Marland Kitchen.. 1 by mouth once daily 5)  Norvasc 10 Mg Tabs (Amlodipine besylate) .... Once daily 6)  Xanax 0.5 Mg Tabs (Alprazolam)  .Marland Kitchen.. 1 by mouth at bedtime 7)  Promethazine Hcl 25 Mg Tabs (Promethazine hcl) .Marland Kitchen.. 1 by mouth every 4 hours as needed for nausea 8)  Dicyclomine Hcl 20 Mg Tabs (Dicyclomine hcl) .... Three times a day as needed cramps 9)  Ciprofloxacin Hcl 500 Mg Tabs (Ciprofloxacin hcl) .... Two times a day   Patient Instructions: 1)  Please schedule a follow-up appointment as needed.   Prescriptions: CIPROFLOXACIN HCL 500 MG TABS (CIPROFLOXACIN HCL) two times a day  #20 x 0   Entered and Authorized by:   Nelwyn Salisbury MD   Signed by:   Nelwyn Salisbury MD on 05/12/2008   Method used:   Electronically to        [facility] [id number] (retail)       [address] [place], [place]  [address]       Ph: [phone]       Fax: [phone]   RxID:   [id number] DICYCLOMINE HCL 20 MG TABS (DICYCLOMINE HCL) three times a day as needed cramps  #60 x 2  Entered and Authorized by:   Nelwyn Salisbury MD   Signed by:   Nelwyn Salisbury MD on 05/12/2008   Method used:   Electronically to        [facility] [id number] (retail)       [address] [place], [place]  [address]       Ph: [phone]       Fax: [phone]   RxID:   [id number]  ] Laboratory Results   Urine Tests  Date/Time Recieved: May 12, 2008 12:05 PM Date/Time Reported: May 12, 2008 12:05 PM  Routine Urinalysis   Color: yellow Appearance: Clear Glucose: negative   (Normal Range: Negative) Bilirubin: negative   (Normal Range: Negative) Ketone: negative   (Normal Range: Negative) Spec. Gravity: 1.015   (Normal Range: 1.003-1.035) Blood: negative   (Normal Range: Negative) pH: 5.0   (Normal Range: 5.0-8.0) Protein: negative   (Normal Range: Negative) Urobilinogen: 0.2   (Normal Range: 0-1) Nitrite: negative   (Normal Range: Negative) Leukocyte Esterace: negative   (Normal Range: Negative)    CommentsAlfred Levins, CMA  May 12, 2008 12:05 PM

## 2010-10-22 NOTE — Progress Notes (Signed)
Summary: PT INFORMED  Phone Note Call from Patient Call back at Home Phone [phone]   Caller: Patient Call For: [name] Summary of Call: Pt called to report "everyone in our home has a cold".  Because of my HBP, I've been taking Corrcedin HBP, does not seem to be helping at all, coughing keeping her awake at HS, aching all over.  Declined OV, I don't want to infect everyone.  "My head is all stopped up, ears hurting,, fever of 101.6".  What else to try OTC for sx or Rx please  [facility] Initial call taken by: Sid Falcon LPN,  July 16, 2007 10:14 AM  Follow-up for Phone Call        use Mucinex as needed  Follow-up by: Nelwyn Salisbury MD,  July 16, 2007 1:06 PM  Additional Follow-up for Phone Call Additional follow up Details #1::        PT IS AWARE AS INDICATED ABOVE. Additional Follow-up by: Warnell Forester,  July 16, 2007 4:32 PM

## 2010-10-22 NOTE — Assessment & Plan Note (Signed)
Summary: EYE INFECTION/CCM   Vital Signs:  Patient Profile:   50 Years Old Female Weight:      249 pounds (113.18 kg) Temp:     98.8 degrees F (37.11 degrees C) oral Pulse rate:   77 / minute BP sitting:   182 / 100  (left arm)  Vitals Entered By: Alfred Levins, CMA (May 18, 2007 9:57 AM)               Chief Complaint:  rt eye red.  History of Present Illness: 2 days of pain, redness, swelling and discharge in right eye. No change in vision. No fever or head cold sx. No contact lenses.  Current Allergies: ! CODEINE     Review of Systems      See HPI   Physical Exam  General:     Well-developed,well-nourished,in no acute distress; alert,appropriate and cooperative throughout examination Eyes:     right upper and lower lids are swollen, no styes. Conjunctiva is red and swollen. Cornea clear and PERRLA. Left eye clear.    Impression & Recommendations:  Problem # 1:  CONJUNCTIVITIS NOS (ICD-372.30)  Complete Medication List: 1)  Metoprolol Tartrate 50 Mg Tabs (Metoprolol tartrate) 2)  Simvastatin 20 Mg Tabs (Simvastatin) .... Take 1 tablet by mouth at bedtime 3)  Ativan 1 Mg Tabs (Lorazepam) .... Take one tablet every six hours as needed 4)  Diovan 320 Mg Tabs (Valsartan) .... Take 1 tablet by mouth once a day 5)  Norvasc 5 Mg Tabs (Amlodipine besylate) .... Take 1 tablet by mouth once a day 6)  Indomethacin 50 Mg Caps (Indomethacin) .... Take 1 tablet by mouth three times a day 7)  Dolophine 5 Mg Tabs (Methadone hcl) .Marland Kitchen.. 1 by mouth once daily 8)  Aspir-low 81 Mg Tbec (Aspirin) .Marland Kitchen.. 1 by mouth once daily 9)  Tobradex 0.3-0.1 % Susp (Tobramycin-dexamethasone) .... 2 drops in eye 4 times a day until clear   Patient Instructions: 1)  Please schedule a follow-up appointment as needed.    Prescriptions: TOBRADEX 0.3-0.1 %  SUSP (TOBRAMYCIN-DEXAMETHASONE) 2 drops in eye 4 times a day until clear  #55ml x 0   Entered and Authorized by:   Nelwyn Salisbury MD  Signed by:   Nelwyn Salisbury MD on 05/18/2007   Method used:   Electronically sent to ...       [facility] [address]       [address] [place], [place]  [address]       Ph: [phone]       Fax: [phone]   RxID:   [id number]

## 2010-10-22 NOTE — Assessment & Plan Note (Signed)
Summary: consult re: pain in lft side for approx 2 wks/cjr   Vital Signs:  Patient profile:   50 year old female Weight:      264 pounds O2 Sat:      94 % Temp:     98.5 degrees F Pulse rate:   78 / minute BP sitting:   140 / 84  (left arm) Cuff size:   large  Vitals Entered By: Pura Spice, RN (July 01, 2010 9:58 AM) CC: LLQ pain pain in groin x 2 wks. states had injection from pain clinic in March.    History of Present Illness: Here for 2 weeks of intermittent sharp LLQ pains. No change in urinations or BMs. No nausea or fever.   Allergies: 1)  ! Codeine 2)  ! Sulfa  Past History:  Past Medical History: Reviewed history from 07/25/2008 and no changes required. Osteoarthritis Headache Hypertension Low back pain, sees Dr. Vear Clock for pain management  Past Surgical History: Reviewed history from 07/30/2009 and no changes required. right total knee replacement per Dr. Ula Lingo Ganglion cyst removed from right wrist Hysterectomy, laparoscopic 10-07 per Dr. Su Hilt large lipoma removed [facility] hip [facility] 6-09 left total knee replacement 06-25-09 per Dr. Merrilee Seashore at [facility]  Review of Systems  The patient denies anorexia, fever, weight loss, weight gain, vision loss, decreased hearing, hoarseness, chest pain, syncope, dyspnea on exertion, peripheral edema, prolonged cough, headaches, hemoptysis, melena, hematochezia, severe indigestion/heartburn, hematuria, incontinence, genital sores, muscle weakness, suspicious skin lesions, transient blindness, difficulty walking, depression, unusual weight change, abnormal bleeding, enlarged lymph nodes, angioedema, breast masses, and testicular masses.         Flu Vaccine Consent Questions     Do you have a history of severe allergic reactions to this vaccine? no    Any prior history of allergic reactions to egg and/or gelatin? no    Do you have a sensitivity to the preservative Thimersol? no    Do you have a past  history of Guillan-Barre Syndrome? no    Do you currently have an acute febrile illness? no    Have you ever had a severe reaction to latex? no    Vaccine information given and explained to patient? yes    Are you currently pregnant? no    Lot Number:[id number]   Exp Date:03/22/2011   Site Given  Left Deltoid IM Pura Spice, RN  July 01, 2010 10:00 AM   Physical Exam  General:  in pain, walks with a cane Abdomen:  soft, normal bowel sounds, no distention, no masses, no guarding, no rigidity, no rebound tenderness, no abdominal hernia, no inguinal hernia, no hepatomegaly, and no splenomegaly.  Moderately tender in the left flank and LLQ.    Impression & Recommendations:  Problem # 1:  UTI (ICD-599.0)  Her updated medication list for this problem includes:    Ciprofloxacin Hcl 500 Mg Tabs (Ciprofloxacin hcl) .Marland Kitchen..Marland Kitchen Two times a day  Orders: UA Dipstick w/o Micro (manual) ([address]) T-Culture, Urine ([id number])  Complete Medication List: 1)  Metoprolol Tartrate 50 Mg Tabs (Metoprolol tartrate) .... Two times a day 2)  Norvasc 5 Mg Tabs (Amlodipine besylate) .... Two times a day 3)  Furosemide 40 Mg Tabs (Furosemide) .... Once daily 4)  Alprazolam 0.5 Mg Tabs (Alprazolam) .... At bedtime 5)  Prilosec 20 Mg Cpdr (Omeprazole) .... One by mouth daily 6)  Oxycodone Hcl 5 Mg Tabs (Oxycodone hcl) .Marland Kitchen.. 1-2 every 3 hours as needed pain 7)  Methadone Hcl 10 Mg Tabs (Methadone hcl) .... Pain clinic 8)  Ciprofloxacin Hcl 500 Mg Tabs (Ciprofloxacin hcl) .... Two times a day  Other Orders: Admin 1st Vaccine ([address]) Flu Vaccine 65yrs + ([address]) Gastroenterology Referral (GI)  Patient Instructions: 1)  Please schedule a follow-up appointment as needed .  Prescriptions: CIPROFLOXACIN HCL 500 MG TABS (CIPROFLOXACIN HCL) two times a day  #20 x 0   Entered and Authorized by:   Nelwyn Salisbury MD   Signed by:   Nelwyn Salisbury MD on 07/01/2010   Method used:   Electronically to        [facility] [phone]* (retail)       [address]       [place], [place]  [address]       Ph: [phone]       Fax: [phone]   RxID:   [id number]   Laboratory Results   Urine Tests  Date/Time Received: July 01, 2010 10:29 AM Date/Time Reported: 10:29 AM  Routine Urinalysis   Color: yellow Appearance: Clear Glucose: negative   (Normal Range: Negative) Bilirubin: negative   (Normal Range: Negative) Ketone: negative   (Normal Range: Negative) Spec. Gravity: 1.015   (Normal Range: 1.003-1.035) Blood: large   (Normal Range: Negative) pH: 6.0   (Normal Range: 5.0-8.0) Protein: trace   (Normal Range: Negative) Urobilinogen: 0.2   (Normal Range: 0-1) Nitrite: positive   (Normal Range: Negative) Leukocyte Esterace: trace   (Normal Range: Negative)    Comments: Pura Spice, RN  July 01, 2010 10:29 AM

## 2010-10-22 NOTE — Progress Notes (Signed)
Summary: refill Alprazolam  Phone Note Refill Request Message from:  Fax from Pharmacy on Feb 14, 2010 8:01 AM  Refills Requested: Medication #1:  ALPRAZOLAM 0.5 MG TABS at bedtime   Dosage confirmed as above?Dosage Confirmed   Supply Requested: 1 month   Last Refilled: 01/09/2010  Method Requested: Fax to Local Pharmacy Initial call taken by: Raechel Ache, RN,  Feb 14, 2010 8:01 AM Caller: [facility] [address] [phone]*  Follow-up for Phone Call        call in #30 with 5 rf Follow-up by: Nelwyn Salisbury MD,  Feb 14, 2010 10:32 AM  Additional Follow-up for Phone Call Additional follow up Details #1::        Rx faxed to pharmacy Additional Follow-up by: Raechel Ache, RN,  Feb 14, 2010 10:36 AM    Prescriptions: ALPRAZOLAM 0.5 MG TABS (ALPRAZOLAM) at bedtime  #30 x 5   Entered by:   Raechel Ache, RN   Authorized by:   Nelwyn Salisbury MD   Signed by:   Raechel Ache, RN on 02/14/2010   Method used:   Historical   RxID:   [id number]

## 2010-10-22 NOTE — Progress Notes (Signed)
Summary: mammogram results please  Phone Note Call from Patient Call back at Home Phone [phone]   Caller: Patient Call For: Nelwyn Salisbury MD Summary of Call: Pt is calling for her mammogram results, please.  Initial call taken by: Lynann Beaver CMA,  December 25, 2008 9:03 AM  Follow-up for Phone Call        it was normal, but  she will be getting a letter about it any day now Follow-up by: Nelwyn Salisbury MD,  December 25, 2008 9:07 AM  Additional Follow-up for Phone Call Additional follow up Details #1::        Phone Call Completed/Patient Notified Additional Follow-up by: Barnie Mort,  December 25, 2008 10:08 AM

## 2010-10-22 NOTE — Assessment & Plan Note (Signed)
Summary: follow up re: urination issues/cjr   Vital Signs:  Patient profile:   50 year old female Weight:      265 pounds Temp:     98.0 degrees F oral Pulse rate:   92 / minute BP sitting:   142 / 98  (left arm) Cuff size:   large  Vitals Entered By: Alfred Levins, CMA (July 30, 2009 9:41 AM) CC: hematuria   History of Present Illness: Here for continued blood in the urine intermittently. No difficulty urinating, no burning or pain or urgency. Was here on 07-09-09, and was given a course of Cipro. Her BMs are regular. She had a Urology workup in 8-07 that was normal.   Allergies: 1)  ! Codeine 2)  ! Sulfa  Past History:  Past Medical History: Reviewed history from 07/25/2008 and no changes required. Osteoarthritis Headache Hypertension Low back pain, sees Dr. Vear Clock for pain management  Past Surgical History: right total knee replacement per Dr. Ula Lingo Ganglion cyst removed from right wrist Hysterectomy, laparoscopic 10-07 per Dr. Su Hilt large lipoma removed from right hip at [facility] 6-09 left total knee replacement 06-25-09 per Dr. Merrilee Seashore at [facility]  Review of Systems  The patient denies anorexia, fever, weight loss, weight gain, vision loss, decreased hearing, hoarseness, chest pain, syncope, dyspnea on exertion, peripheral edema, prolonged cough, headaches, hemoptysis, abdominal pain, melena, hematochezia, severe indigestion/heartburn, incontinence, genital sores, muscle weakness, suspicious skin lesions, transient blindness, difficulty walking, depression, unusual weight change, abnormal bleeding, enlarged lymph nodes, angioedema, breast masses, and testicular masses.    Physical Exam  General:  overweight-appearing.   Abdomen:  Bowel sounds positive,abdomen soft and non-tender without masses, organomegaly or hernias noted.   Impression & Recommendations:  Problem # 1:  GROSS HEMATURIA (ICD-599.71)  Orders: UA Dipstick w/o Micro (manual) (04540)  T-Culture, Urine ([id number]) Urology Referral (Urology)  Complete Medication List: 1)  Metoprolol Tartrate 50 Mg Tabs (Metoprolol tartrate) .... Two times a day 2)  Norvasc 5 Mg Tabs (Amlodipine besylate) .... Two times a day 3)  Furosemide 40 Mg Tabs (Furosemide) .... Once daily 4)  Alprazolam 0.5 Mg Tabs (Alprazolam) .... At bedtime 5)  Prilosec 20 Mg Cpdr (Omeprazole) .... One by mouth daily 6)  Lovenox 30 Mg/0.37ml Soln (Enoxaparin sodium) .... 40 mg once daily 7)  Oxycodone Hcl 5 Mg Tabs (Oxycodone hcl) .Marland Kitchen.. 1-2 every 3 hours as needed pain  Patient Instructions: 1)  We will culture the urine today, but I suspect this will be negative. Refer to Urology again for hematuria.  Prescriptions: ALPRAZOLAM 0.5 MG TABS (ALPRAZOLAM) at bedtime  #30 x 5   Entered and Authorized by:   Nelwyn Salisbury MD   Signed by:   Nelwyn Salisbury MD on 07/30/2009   Method used:   Print then Give to Patient   RxID:   [id number]   Laboratory Results   Urine Tests  Date/Time Received: July 30, 2009 9:41 AM Date/Time Reported: July 30, 2009 9:41 AM  Routine Urinalysis   Color: yellow Appearance: Cloudy Glucose: negative   (Normal Range: Negative) Bilirubin: negative   (Normal Range: Negative) Ketone: negative   (Normal Range: Negative) Spec. Gravity: 1.015   (Normal Range: 1.003-1.035) Blood: moderate   (Normal Range: Negative) pH: 6.5   (Normal Range: 5.0-8.0) Protein: negative   (Normal Range: Negative) Urobilinogen: 0.2   (Normal Range: 0-1) Nitrite: negative   (Normal Range: Negative) Leukocyte Esterace: negative   (Normal Range: Negative)    Comments:  Alfred Levins, CMA  July 30, 2009 9:58 AM     Appended Document: follow up re: urination issues/cjr  Laboratory Results   Urine Tests    Routine Urinalysis   Color: yellow Appearance: Clear Glucose: negative   (Normal Range: Negative) Bilirubin: negative   (Normal Range: Negative) Ketone: negative   (Normal  Range: Negative) Spec. Gravity: 1.020   (Normal Range: 1.003-1.035) Blood: 1+   (Normal Range: Negative) pH: 6.5   (Normal Range: 5.0-8.0) Protein: negative   (Normal Range: Negative) Urobilinogen: 0.2   (Normal Range: 0-1) Nitrite: negative   (Normal Range: Negative) Leukocyte Esterace: negative   (Normal Range: Negative)    Comments: Rita Ohara  July 30, 2009 1:28 PM      Appended Document: follow up re: urination issues/cjr we already did a manual UA here. Please make sure the pt. is not charged twice. Also please culture this urine.

## 2010-10-24 NOTE — Procedures (Signed)
Summary: Colonoscopy  Patient: [name] Note: All result statuses are Final unless otherwise noted.  Tests: (1) Colonoscopy (COL)   COL Colonoscopy           DONE     [facility]     [address]     [place], [place]  [address]           COLONOSCOPY PROCEDURE REPORT           PATIENT:  Diane, [name]  MR#:  [id number]     BIRTHDATE:  March 26, 1961, 49 yrs. old  GENDER:  female     ENDOSCOPIST:  Wilhemina Bonito. Eda Keys, MD     REF. BY:  Tera Mater. Clent Ridges, M.D.     PROCEDURE DATE:  08/20/2010     PROCEDURE:  Colonoscopy with snare polypectomy x 3     ASA CLASS:  Class II     INDICATIONS:  Routine Risk Screening, Abdominal pain (LLQ)     MEDICATIONS:   Fentanyl 100 mcg IV, Versed 12 mg IV, Benadryl 50     mg IV           DESCRIPTION OF PROCEDURE:   After the risks benefits and     alternatives of the procedure were thoroughly explained, informed     consent was obtained.  Digital rectal exam was performed and     revealed no abnormalities.   The LB 180AL [id number] endoscope was     introduced through the anus and advanced to the cecum, which was     identified by both the appendix and ileocecal valve, without     limitations.Time to cecum = 2:35 min.  The quality of the prep was     excellent, using MoviPrep.  The instrument was then slowly     withdrawn (time = 14:47 min) as the colon was fully examined.     <<PROCEDUREIMAGES>>           FINDINGS:  Three polyps were found - 4mm in the cecum and 1mm in     ascending colon were snared without cautery. Retrieval was     successful in the larger  Also, 11mm pedunculated polyp in rectum     removed w/ snare cautery and retrieved. Moderate diverticulosis     was found in the left half of the colon.   Retroflexed views in     the rectum revealed no abnormalities.    The scope was then     withdrawn from the patient and the procedure completed.           COMPLICATIONS:  None     ENDOSCOPIC IMPRESSION:     1) Three polyps -  removed     2) Moderate diverticulosis in the left half of the colon           RECOMMENDATIONS:     1) Repeat Colonoscopy in 3 years.     2) My office will arrange for you to have a Contrast enhanced CT     scan of abdomen and pelvis "LLQ pain".     3) FOLLOW UP OFFICE VISIT WITH DR Marina Goodell SOME TIME AFTER CT     COMPLETED           ______________________________     Wilhemina Bonito. Eda Keys, MD           CC:  Nelwyn Salisbury, MD; The Patient           n.  eSIGNED:   Wilhemina Bonito. Eda Keys at 08/20/2010 12:53 PM           Carroll Sage, [phone]  Note: An exclamation mark (!) indicates a result that was not dispersed into the flowsheet. Document Creation Date: 08/20/2010 12:54 PM _______________________________________________________________________  (1) Order result status: Final Collection or observation date-time: 08/20/2010 12:40 Requested date-time:  Receipt date-time:  Reported date-time:  Referring Physician:   Ordering Physician: Fransico Setters [phone]) Specimen Source:  Source: Launa Grill Order Number: [id number] Lab site:   Appended Document: Colonoscopy     Procedures Next Due Date:    Colonoscopy: 07/2013

## 2010-10-24 NOTE — Assessment & Plan Note (Signed)
Summary: LLQ pain, screening colonoscopy   History of Present Illness Visit Type: Initial Consult Primary GI MD: Yancey Flemings MD Primary Provider: Gershon Crane, MD Requesting Provider: Gershon Crane, MD Chief Complaint: LLQ abdominal pain   screening colonoscopy History of Present Illness:   50 year old female with hypertension, osteoarthritis, GERD, and chronic back pain for which she is followed at the pain clinic. She is in today for evaluation of left lower quadrant pain and screening colonoscopy. The patient reports to me intermittent problems with left lower quadrant pain that has worsened since August. She describes 2-3 episodes per day where she experienced a sharp pain in the left lower quadrant. There are no inciting or exacerbating factors. The discomfort last for a few minutes and resolve spontaneously with rest. Her bowel habits are regular and she goes daily. No bleeding. No weight loss. No fevers. She does have chronic problems with hematuria for which she tells me she has had negative urologic evaluation. Review of urinalysis from July 01, 2010 shows large blood. CBC from July 25, 2008 was unremarkable as was liver function tests. She reports chronic GERD. Thyroid cyst and regurgitation principally. Symptoms, for the most part, controlled with once daily Prilosec. No dysphagia.Marland Kitchen   GI Review of Systems    Reports abdominal pain and  acid reflux.     Location of  Abdominal pain: LLQ.    Denies belching, bloating, chest pain, dysphagia with liquids, dysphagia with solids, heartburn, loss of appetite, nausea, vomiting, vomiting blood, weight loss, and  weight gain.        Denies anal fissure, black tarry stools, change in bowel habit, constipation, diarrhea, diverticulosis, fecal incontinence, heme positive stool, hemorrhoids, irritable bowel syndrome, jaundice, light color stool, liver problems, rectal bleeding, and  rectal pain. Preventive Screening-Counseling &  Management  Alcohol-Tobacco     Smoking Status: current      Drug Use:  no.      Current Medications (verified): 1)  Metoprolol Tartrate 50 Mg Tabs (Metoprolol Tartrate) .... Two Times A Day 2)  Norvasc 5 Mg Tabs (Amlodipine Besylate) .... Two Times A Day 3)  Furosemide 40 Mg Tabs (Furosemide) .... Once Daily 4)  Prilosec 20 Mg Cpdr (Omeprazole) .... One By Mouth Daily 5)  Oxycodone Hcl 5 Mg Tabs (Oxycodone Hcl) .Marland Kitchen.. 1-2 Every 3 Hours As Needed Pain 6)  Methadone Hcl 10 Mg Tabs (Methadone Hcl) .... Pain Clinic 7)  Amitriptyline Hcl 25 Mg Tabs (Amitriptyline Hcl) .Marland Kitchen.. 1 By Mouth At Bedtime  Allergies (verified): 1)  ! Codeine 2)  ! Sulfa  Past History:  Past Medical History: Reviewed history from 07/25/2008 and no changes required. Osteoarthritis Headache Hypertension Low back pain, sees Dr. Vear Clock for pain management  Past Surgical History: right total knee replacement per Dr. Ula Lingo Ganglion cyst removed from right wrist Hysterectomy, laparoscopic 10-07 per Dr. Su Hilt large lipoma removed from right hip at [facility] 6-09 left total knee replacement 06-25-09 per Dr. Merrilee Seashore at [facility] Tonsillectomy  Family History: Reviewed history and no changes required. Family History of Colon Cancer:MGM Family History of Stomach Cancer:Mother Family History of Prostate Cancer: father crohn's Disease Nephew Family History of Heart Disease:   Social History: Reviewed history and no changes required. Occupation: disabled Married  Patient currently smokes.  5 cigs. per day Alcohol Use - no Daily Caffeine Use   4 Illicit Drug Use - no Drug Use:  no  Review of Systems       The patient complains of arthritis/joint pain,  back pain, blood in urine, headaches-new, muscle pains/cramps, sleeping problems, swelling of feet/legs, and thirst - excessive.  The patient denies allergy/sinus, anemia, anxiety-new, breast changes/lumps, change in vision, confusion, cough, coughing up  blood, depression-new, fainting, fatigue, fever, hearing problems, heart murmur, heart rhythm changes, itching, menstrual pain, night sweats, nosebleeds, pregnancy symptoms, shortness of breath, skin rash, sore throat, swollen lymph glands, thirst - excessive , urination - excessive , urination changes/pain, urine leakage, vision changes, and voice change.    Vital Signs:  Patient profile:   51 year old female Height:      67 inches Weight:      262 pounds BMI:     41.18 Pulse rate:   88 / minute Pulse rhythm:   regular BP sitting:   132 / 98  (left arm)  Vitals Entered By: Milford Cage NCMA (August 08, 2010 9:54 AM)  Physical Exam  General:  Well developed,obese,, well nourished, no acute distress. Head:  Normocephalic and atraumatic. Eyes:  PERRLA, no icterus. Mouth:  No deformity or lesions. Neck:  Supple; no masses or thyromegaly. Lungs:  Clear throughout to auscultation. Heart:  Regular rate and rhythm; no murmurs, rubs,  or bruits. Abdomen:  Soft, obese,nontender and nondistended. No masses, hepatosplenomegaly or hernias noted. Normal bowel sounds. Rectal:  deferred until colonoscopy Msk:  osteoarthritic changes of the knees Pulses:  Normal pulses noted. Extremities:  no edema Neurologic:  Alert and  oriented x4;  grossly normal neurologically. Skin:  Intact without significant lesions or rashes. Psych:  Alert and cooperative. Normal mood and affect.   Impression & Recommendations:  Problem # 1:  ABDOMINAL PAIN, LEFT LOWER QUADRANT (ICD-789.04) bleeding left lower quadrant pain. Nonspecific by description. She is status post hysterectomy. She does have chronic hematuria of uncertain etiology. Her pain may be related to colonic spasm, adhesions, or GYN/GU source. No worrisome features such as weight loss or bleeding.  Plan, #1. Undergoing routine screening colonoscopy. At that time Will further evaluate for colonic causes for pain. #2. If colonoscopy is unrevealing,  then consider a CT scan of the abdomen and pelvis.  Problem # 2:  SPECIAL SCREENING FOR MALIGNANT NEOPLASMS COLON (ICD-V76.51) appropriate candidate for screening colonoscopy without contraindication. The nature of the procedure as well as the risks, benefits, and alternatives were reviewed. She understood and agreed to proceed. Movi prep prescribed. The patient instructed on its use  Problem # 3:  GERD (ICD-530.81) chronic GERD. Requires PPI for control of symptoms. No dysphagia.  Plan: #1. Reflux precautions with attention to weight loss #2. Continue PPI to control GERD symptoms  Other Orders: Colonoscopy (Colon)  Patient Instructions: 1)  Colonoscopy [facility] 08/20/10 11:30 am arrive at 10:30 am 2)  Movi prep instructions given 3)  Movi prep Rx. sent to pharmacy 4)  Colonoscopy and Flexible Sigmoidoscopy brochure given.  5)  Copy sent to : Gershon Crane, MD 6)  The medication list was reviewed and reconciled.  All changed / newly prescribed medications were explained.  A complete medication list was provided to the patient / caregiver. Prescriptions: MOVIPREP 100 GM  SOLR (PEG-KCL-NACL-NASULF-NA ASC-C) As per prep instructions.  #1 x 0   Entered by:   Milford Cage NCMA   Authorized by:   Hilarie Fredrickson MD   Signed by:   Milford Cage NCMA on 08/08/2010   Method used:   Electronically to        [id number] (retail)       [phone]  [address]       [place], [place]  [address]       Ph: [phone]       Fax: [phone]   RxID:   [id number]

## 2010-11-23 ENCOUNTER — Encounter: Payer: Self-pay | Admitting: Family Medicine

## 2010-11-28 ENCOUNTER — Encounter: Payer: Self-pay | Admitting: Family Medicine

## 2010-11-28 ENCOUNTER — Ambulatory Visit (INDEPENDENT_AMBULATORY_CARE_PROVIDER_SITE_OTHER): Payer: 59 | Admitting: Family Medicine

## 2010-11-28 VITALS — BP 162/92 | Temp 98.6°F

## 2010-11-28 DIAGNOSIS — I10 Essential (primary) hypertension: Secondary | ICD-10-CM

## 2010-11-28 DIAGNOSIS — K219 Gastro-esophageal reflux disease without esophagitis: Secondary | ICD-10-CM

## 2010-11-28 DIAGNOSIS — D179 Benign lipomatous neoplasm, unspecified: Secondary | ICD-10-CM

## 2010-11-28 MED ORDER — METOPROLOL TARTRATE 50 MG PO TABS: 50.0000 mg | ORAL_TABLET | Freq: Two times a day (BID) | ORAL | Status: DC

## 2010-11-28 MED ORDER — AMLODIPINE BESYLATE 5 MG PO TABS: 5.0000 mg | ORAL_TABLET | Freq: Two times a day (BID) | ORAL | Status: DC

## 2010-11-28 MED ORDER — LIDOCAINE 5 % EX PTCH: 1.0000 | MEDICATED_PATCH | Freq: Two times a day (BID) | CUTANEOUS | Status: DC

## 2010-11-28 MED ORDER — OMEPRAZOLE 40 MG PO CPDR: 40.0000 mg | DELAYED_RELEASE_CAPSULE | Freq: Every day | ORAL | Status: DC

## 2010-11-28 NOTE — Progress Notes (Signed)
  Subjective:    Patient ID: [name], female    DOB: April 28, 1961, 50 y.o.   MRN: [id number]  HPI Here to follow up on HTN and to ask about a painful knot on the left upper back. This knot started to bother her 3 weeks ago. Of note, she has had 2 lipomas removed from her back over the years.   Review of Systems  Constitutional: Negative.   Respiratory: Negative.   Cardiovascular: Negative.   Musculoskeletal: Positive for back pain.       Objective:   Physical Exam  Constitutional:       In a wheelchair, in pain, alert  Cardiovascular: Normal rate, regular rhythm, normal heart sounds and intact distal pulses.   Pulmonary/Chest: Effort normal and breath sounds normal.  Musculoskeletal:       There is a firm mobile very tender lump over the left scapula about 6 cm across           Assessment & Plan:  Refilled her BP meds. She has another inflamed lipoma on the back. We will try to ease her pain with Lidoderm patches to see if the inflammation will calm down. If not better in a few weeks, we nay have her see Surgery again

## 2010-12-10 LAB — URINE MICROSCOPIC-ADD ON

## 2010-12-10 LAB — URINALYSIS, ROUTINE W REFLEX MICROSCOPIC
Bilirubin Urine: NEGATIVE
Glucose, UA: NEGATIVE mg/dL
Ketones, ur: NEGATIVE mg/dL
Leukocytes, UA: NEGATIVE
Nitrite: NEGATIVE
Protein, ur: NEGATIVE mg/dL
Specific Gravity, Urine: 1.008 (ref 1.005–1.030)
Urobilinogen, UA: 0.2 mg/dL (ref 0.0–1.0)
pH: 6.5 (ref 5.0–8.0)

## 2010-12-24 LAB — DIFFERENTIAL
Basophils Absolute: 0.1 10*3/uL (ref 0.0–0.1)
Basophils Relative: 1 % (ref 0–1)
Eosinophils Absolute: 0.1 10*3/uL (ref 0.0–0.7)
Eosinophils Relative: 2 % (ref 0–5)
Lymphocytes Relative: 35 % (ref 12–46)
Lymphs Abs: 3 10*3/uL (ref 0.7–4.0)
Monocytes Absolute: 0.4 10*3/uL (ref 0.1–1.0)
Monocytes Relative: 5 % (ref 3–12)
Neutro Abs: 4.9 10*3/uL (ref 1.7–7.7)
Neutrophils Relative %: 58 % (ref 43–77)

## 2010-12-24 LAB — CULTURE, ROUTINE-ABSCESS

## 2010-12-24 LAB — CBC
HCT: 38.1 % (ref 36.0–46.0)
Hemoglobin: 12.9 g/dL (ref 12.0–15.0)
MCHC: 33.7 g/dL (ref 30.0–36.0)
MCV: 88.6 fL (ref 78.0–100.0)
Platelets: 279 10*3/uL (ref 150–400)
RBC: 4.3 MIL/uL (ref 3.87–5.11)
RDW: 14 % (ref 11.5–15.5)
WBC: 8.4 10*3/uL (ref 4.0–10.5)

## 2010-12-24 LAB — SEDIMENTATION RATE: Sed Rate: 23 mm/hr — ABNORMAL HIGH (ref 0–22)

## 2011-01-22 ENCOUNTER — Emergency Department (HOSPITAL_COMMUNITY)
Admission: EM | Admit: 2011-01-22 | Discharge: 2011-01-22 | Disposition: A | Discharge status: Home / Self Care | Payer: 59 | Attending: Emergency Medicine | Admitting: Emergency Medicine

## 2011-01-22 ENCOUNTER — Emergency Department (HOSPITAL_COMMUNITY): Payer: 59

## 2011-01-22 DIAGNOSIS — R222 Localized swelling, mass and lump, trunk: Secondary | ICD-10-CM | POA: Insufficient documentation

## 2011-01-22 DIAGNOSIS — R221 Localized swelling, mass and lump, neck: Secondary | ICD-10-CM | POA: Insufficient documentation

## 2011-01-22 DIAGNOSIS — I809 Phlebitis and thrombophlebitis of unspecified site: Secondary | ICD-10-CM | POA: Insufficient documentation

## 2011-01-22 DIAGNOSIS — I10 Essential (primary) hypertension: Secondary | ICD-10-CM | POA: Insufficient documentation

## 2011-01-22 DIAGNOSIS — F17200 Nicotine dependence, unspecified, uncomplicated: Secondary | ICD-10-CM | POA: Insufficient documentation

## 2011-01-22 DIAGNOSIS — Z9889 Other specified postprocedural states: Secondary | ICD-10-CM | POA: Insufficient documentation

## 2011-01-22 DIAGNOSIS — R220 Localized swelling, mass and lump, head: Secondary | ICD-10-CM | POA: Insufficient documentation

## 2011-01-22 DIAGNOSIS — Z86718 Personal history of other venous thrombosis and embolism: Secondary | ICD-10-CM | POA: Insufficient documentation

## 2011-01-22 LAB — CBC
HCT: 33.5 % — ABNORMAL LOW (ref 36.0–46.0)
Hemoglobin: 11.1 g/dL — ABNORMAL LOW (ref 12.0–15.0)
MCH: 29.4 pg (ref 26.0–34.0)
MCHC: 33.1 g/dL (ref 30.0–36.0)
MCV: 88.6 fL (ref 78.0–100.0)
Platelets: 371 10*3/uL (ref 150–400)
RBC: 3.78 MIL/uL — ABNORMAL LOW (ref 3.87–5.11)
RDW: 13.7 % (ref 11.5–15.5)
WBC: 10.5 10*3/uL (ref 4.0–10.5)

## 2011-01-22 LAB — DIFFERENTIAL
Basophils Absolute: 0 10*3/uL (ref 0.0–0.1)
Basophils Relative: 0 % (ref 0–1)
Eosinophils Absolute: 0.3 10*3/uL (ref 0.0–0.7)
Eosinophils Relative: 3 % (ref 0–5)
Lymphocytes Relative: 27 % (ref 12–46)
Lymphs Abs: 2.8 10*3/uL (ref 0.7–4.0)
Monocytes Absolute: 0.5 10*3/uL (ref 0.1–1.0)
Monocytes Relative: 5 % (ref 3–12)
Neutro Abs: 6.9 10*3/uL (ref 1.7–7.7)
Neutrophils Relative %: 65 % (ref 43–77)

## 2011-01-22 LAB — PROTIME-INR
INR: 1.01 (ref 0.00–1.49)
Prothrombin Time: 13.5 seconds (ref 11.6–15.2)

## 2011-01-22 LAB — APTT: aPTT: 41 seconds — ABNORMAL HIGH (ref 24–37)

## 2011-01-22 MED ORDER — IOHEXOL 300 MG/ML  SOLN
100.0000 mL | Freq: Once | INTRAMUSCULAR | Status: AC | PRN
  Administered 2011-01-22: 100 mL via INTRAVENOUS

## 2011-01-24 LAB — POCT I-STAT, CHEM 8
BUN: 12 mg/dL (ref 6–23)
Calcium, Ion: 1.01 mmol/L — ABNORMAL LOW (ref 1.12–1.32)
Chloride: 102 mEq/L (ref 96–112)
Creatinine, Ser: 1 mg/dL (ref 0.4–1.2)
Glucose, Bld: 119 mg/dL — ABNORMAL HIGH (ref 70–99)
HCT: 34 % — ABNORMAL LOW (ref 36.0–46.0)
Hemoglobin: 11.6 g/dL — ABNORMAL LOW (ref 12.0–15.0)
Potassium: 5.4 mEq/L — ABNORMAL HIGH (ref 3.5–5.1)
Sodium: 136 mEq/L (ref 135–145)
TCO2: 31 mmol/L (ref 0–100)

## 2011-02-07 NOTE — Op Note (Signed)
[facility]  Patient:    [name], [name]                        MRN: [id number] Proc. Date: 04/20/01 Attending:  Lubertha Basque. Jerl Santos, M.D.                           Operative Report  PREOPERATIVE DIAGNOSIS:  Left knee degenerative arthritis.  POSTOPERATIVE DIAGNOSIS:  Left knee degenerative arthritis.  OPERATION PERFORMED:  Left knee unicompartmental knee replacement.  ANESTHESIA:  General.  ATTENDING SURGEON:  Lubertha Basque. Jerl Santos, M.D.  ASSISTANT: 1. Harvie Junior, M.D. 2. Prince Rome, P.A.  INDICATIONS FOR PROCEDURE:  The patient is a 50 year old woman status post two knee arthroscopies.  She was noted to have medial compartment degenerative change with relatively benign patellofemoral and lateral compartment condition.  She has continued with medial aspect pain despite different oral anti-inflammatories and injectables into the knee.  At this point she has been left with pain at rest and with activity and was offered a unicompartmental knee replacement.  The procedure was discussed with the patient and informed operative consent was obtained after discussion of possible complications of reaction to anesthesia, infection, and DVT.  DESCRIPTION OF PROCEDURE:  The patient was taken to an operating suite where general anesthetic was applied without difficulty.  She was then positioned supine and prepped and draped in normal sterile fashion.  After administration of preop IV antibiotics, the left leg was elevated, exsanguinated, and a tourniquet inflated about the thigh.  A small longitudinal anteromedial incision was made with dissection down to the capsule through an abundance of adipose tissue.  The capsule was incised longitudinally and a portion of the fat pad was excised below.  She had some residual medial meniscus which was excised.  She had some significant degenerative change on both aspects of this compartment.  Her bone  quality was excellent.  An extramedullary guide was applied to the tibia and was utilized to make a tibial cut with a 3 or 4 degree posterior slope.  The initial cut was found to be too conservative and a second cut was made which allowed placement of the 7 mm spacer without difficulty.  Attention was then turned toward the femur.  Different guides were placed here and appropriate cuts were made.  This sized to a size 2 component.  The keels were made for both the femoral and tibial components with bur and osteotome.  A trial reduction was performed with the 7 mm poly tibial component and the size 2 metal femoral component with the heel.  The knee ranged well and balance seemed to be good.  The trial components were removed.  Cement was mixed including the antibiotic.  The cut bony surfaces were all thoroughly irrigated and dried.  Cement was placed in a doughy state on both aspects of this compartment of the joint.  The aforementioned 7 mm poly tibial component and the size 2 femoral Depuy component were placed. Excess cement was trimmed and pressure was held on the component until the cement had hardened.  The knee again ranged fully and was felt to be fairly well balanced.  The knee was thoroughly irrigated followed by release of the tourniquet.  The capsule was repaired with #1 Vicryl suture in interrupted fashion followed by subcutaneous reapproximation with 2-0 undyed Vicryl and skin closure with nylon.  Marcaine was injected about  the incision site followed by Adaptic and dry gauze dressing with a loose Ace wrap.  Estimated blood loss and intraoperative fluids can be obtained from Anesthesia records as can accurate tourniquet time.  DISPOSITION:  The patient was extubated in the operating room and taken to the recovery room in stable condition.  Plans were for her to go home likely in a day or two depending on her progress. DD:  04/20/01 TD:  04/20/01 Job: [id number]

## 2011-02-07 NOTE — Discharge Summary (Signed)
NAME:  [name], [name]               ACCOUNT NO.:  [id number]   MEDICAL RECORD NO.:  [id number]          PATIENT TYPE:  INP   LOCATION:  3704                         FACILITY:  [facility]   PHYSICIAN:  Valerie A. Felicity Coyer, MDDATE OF BIRTH:  12-07-1960   DATE OF ADMISSION:  08/20/2006  DATE OF DISCHARGE:  08/25/2006                               DISCHARGE SUMMARY   DISCHARGE DIAGNOSES:  1. Chest pain status post cardiac cath with no high-grade coronary      obstructions performed on August 24, 2006 by Dr. Shawnie Pons.  2. Hypokalemia.   HISTORY OF PRESENT ILLNESS:  [name] is a 50 year old female who was  admitted on August 20, 2006 with chief complaint of chest pain which  had been present for four days prior to admission and was associated  with chest pressure and heaviness on the left side.  She also noted  palpitations on the morning of admission.  She was noted to have a blood  pressure of 200/110 at home and presented to the emergency room.  In the  ER, her blood pressure was 214/104, she was started on a nitro drip and  was admitted for further evaluation and treatment.   PAST MEDICAL HISTORY:  1. Hyperlipidemia.  2. Osteoarthritis.  3. Hypertension.  4. GERD.  5. Insomnia.   COURSE OF HOSPITALIZATION:  1. Chest pain status post cardiac cath with no high-grade coronary      obstructions.  The patient was admitted and underwent serial      cardiac enzymes which were negative.  However, she was noted to      feel better after administration of sublingual nitro and was noted      to have continued chest discomfort after blood pressure was      improved.  She had been admitted in April of this year with similar      complaints and underwent an adenosine Myoview which showed no      ischemia.  As a result, we consulted [facility] and the      patient underwent a cardiac cath.  There were no critical stenoses      noted.  There was some minor irregularity noted in  the circumflex,      first marginal branch and overall systolic function was preserved.      It was recommended by Dr. Riley Kill that patient be considered for      statin to optimize her LDL, however upon reviewing patient's H&P,      it appears the patient had suffered myalgias on simvastatin, will      defer further treatment to Dr. Clent Ridges.  Patient's LDL noted this      admission was 124.  2. Hypertension.  The patient was continued on her outpatient dosing      of Diovan, she was placed on Norvasc during this admission and her      beta-blocker was increased.  Her aspirin therapy was also decreased      from 325 mg daily to 81 mg p.o. daily per cardiology.  Her blood  pressure at time of discharge is 150/92.  As patient's blood      pressure is still somewhat elevated and she was noted to have      hypokalemia, per recommendation of Dr. Riley Kill he thought that it      might be considered to ask radiology to comment on her adrenal      appearance and also to consider the possibility of workup for      hyperaldosteronism.  It was felt that she may be a candidate for      hydrochlorothiazide, but would likely need potassium      supplementation and close followup.  Will continue current blood      pressure meds for now and defer further adjustment to Dr. Clent Ridges.  The      patient was also instructed to quit smoking.   PERTINENT LABORATORIES AT DISCHARGE:  BUN 10, creatinine 0.9, hemoglobin  12.8, hematocrit, 37.1.   DISPOSITION:  The patient will be transferred to home.   MEDICATIONS AT TIME OF DISCHARGE:  1. Diovan 320 mg p.o. daily.  2. Prevacid 30 mg p.o. daily.  3. Metoprolol 50 mg p.o. b.i.d.  4. Norvasc 5 mg p.o. daily.  5. Aspirin 81 mg p.o. daily.  6. Duragesic patch 25 mcg per hour to be changed every 72 hours.  7. Skelaxin 800 mg p.o. t.i.d.  8. Percocet 5/325 one tablet p.o. q.6.h. p.r.n.   FOLLOWUP:  The patient is to follow up with Dr. Clent Ridges on Monday, December  10 at  11:00 a.m.  She is to call Dr. Clent Ridges or go directly to the ER should  she develop shortness of breath or chest pain.      Sandford Craze, NP      Raenette Rover. Felicity Coyer, MD  Electronically Signed    MO/MEDQ  D:  08/25/2006  T:  08/26/2006  Job:  [id number]   cc:   Tera Mater. Clent Ridges, MD

## 2011-02-07 NOTE — Discharge Summary (Signed)
NAME:  [name], [name]                         ACCOUNT NO.:  [id number]   MEDICAL RECORD NO.:  [id number]                   PATIENT TYPE:  INP   LOCATION:  5001                                 FACILITY:  [facility]   PHYSICIAN:  Lubertha Basque. Jerl Santos, M.D.             DATE OF BIRTH:  16-Jul-1961   DATE OF ADMISSION:  04/25/2003  DATE OF DISCHARGE:  04/29/2003                                 DISCHARGE SUMMARY   ADMITTING DIAGNOSES:  1. End-stage degenerative joint disease, right knee.  2. History of hypertension.  3. History of gout.  4. Status post, left total knee replacement.   DISCHARGE DIAGNOSES:  1. End-stage degenerative joint disease, right knee.  2. History of hypertension.  3. History of gout.  4. Status post, left total knee replacement.   PAST SURGICAL HISTORY:  Right total knee replacement.   BRIEF HISTORY:  [name] is a 50 year old black female, who is a  patient well-known to our clinic.  She has been a patient in our practice  for a number of years.  Has undergone multiple knee operations, and, really,  the last one being a left total knee replacement.  We know from her previous  x-rays that she has end-stage DJD on the right side and now is having  considerable discomfort, pain with every step, when she is ambulating and  nighttime pain.  We have done multiple corticosteroid injections, Synvisc  injection and nonsteroidal anti-inflammatory drugs, all of which she has  failed.  We have discussed treatment options, and since she has already had  a total knee on the left side, she knows exactly what is involved and would  like to have this done on the right side.   PERTINENT LABORATORY AND X-RAY FINDINGS:  Her EKG is normal sinus rhythm.  WBCs 9.4, RBCs 3.72, hemoglobin, last testing, 11.8, hematocrit 35.2,  platelets 425.  INRs were done serially, as she was on low-dose Coumadin  protocol, and her last PT was 15.7 and INR 1.4.  Potassium was 4, creatinine  1.0, BUN 8.  Other lab indices were essentially normal.   HOSPITAL COURSE:  She was admitted after the operation and was put onto the  orthopedic floor, was given IV PCA morphine pump, p.o. pain medications as  well, IV Ancef 1 gm q.8h. x 3 doses and was kept on her home medications,  which were Diovan and HCTZ.   Physical therapy was ordered.  To be weightbearing as tolerated.  She could  use her knee immobilizer at night while she is resting.  A CPM machine was  also ordered for the relief that it can give while at bedrest.   Pharmacy was helpful in regulating her Coumadin for low-dose Coumadin  therapy to prevent DVT, with a goal between an INR of 2 and 3.   The second day, her dressing was changed, and her wound was benign.  Her  drain was pulled, and she was redressed.  She had normal sensation and  vascular status to her toes.  We had a rehab consult, but they had felt that  she would be able to go home on [facility].  Home Health Care agency,  [facility], was contacted.  They were going to arrange physical  therapy as well as blood draws for her INR-Coumadin protocol.   She progressed well and was discharged home.   CONDITION ON DISCHARGE:  Improved.   DISCHARGE MEDICATIONS:  She will go home on her usual dose of Diovan, HCTZ.  A prescription was going to be left at the office for them to pick up.  She  was given a prescription for Coumadin 10 mg to take as directed and Skelaxin  800 mg 1 tablet q.8h. p.r.n. for spasm.   SPECIAL INSTRUCTIONS:  1. She will be up with a walker, as instructed by physical therapy.  2. Keep her dressing clean and dry.  3. Notify the MD if there is any increasing pain or redness, swelling,     drainage, temperatures above 101.5.   FOLLOW UP:  1. Return to our office in 7-10 days for suture removal.  2. Any other problems, she was to call [phone], Dr. Nolon Nations office     number.       Lindwood Qua, P.A.                     Lubertha Basque Jerl Santos, M.D.    MC/MEDQ  D:  05/20/2003  T:  05/21/2003  Job:  [id number]

## 2011-02-07 NOTE — Op Note (Signed)
   NAME:  [name], [name]                         ACCOUNT NO.:  [id number]   MEDICAL RECORD NO.:  [id number]                   PATIENT TYPE:  AMB   LOCATION:  DSC                                  FACILITY:  [facility]   PHYSICIAN:  Lubertha Basque. Jerl Santos, M.D.             DATE OF BIRTH:  1961-01-11   DATE OF PROCEDURE:  05/30/2003  DATE OF DISCHARGE:                                 OPERATIVE REPORT   PREOPERATIVE DIAGNOSIS:  Stiff right total knee replacement.   POSTOPERATIVE DIAGNOSIS:  Stiff right total knee replacement.   PROCEDURE:  Closed manipulation, right knee.   ANESTHESIA:  General mask.   ATTENDING SURGEON:  Lubertha Basque. Jerl Santos, M.D.   ASSISTANT:  Lindwood Qua, P.A.   INDICATION FOR PROCEDURE:  The patient is a 50 year old woman about one  month from a knee replacement operation.  She has persisted with less than  90 degrees of flexion despite aggressive physical therapy and CPM.  In the  operating room at the end of her knee replacement operation, she flexed to  115 degrees.  Our plan at this point is for a closed manipulation.  Informed  operative consent was obtained after discussion of possible complications of  fracture and continued stiffness.   DESCRIPTION OF PROCEDURE:  The patient was taken to the operating suite  where a general anesthetic was applied by mask.  She then underwent a gentle  manipulation which took her flexion from about 75 to 100 degrees of flexion.  She still came to full extension.  After sterile prep, an injection was  performed of the knee with some Marcaine and epinephrine, with a total of  about 8 or 9 cc injected.  A Band-Aid was applied.   DISPOSITION:  The patient was taken to the recovery room in stable  condition.  Plans were for her to go home the same day and follow up in the  office in one week.  I will contact her by phone tonight.                                               Lubertha Basque Jerl Santos, M.D.    PGD/MEDQ  D:  05/30/2003   T:  05/30/2003  Job:  [id number]

## 2011-02-07 NOTE — Discharge Summary (Signed)
NAME:  [name], [name]               ACCOUNT NO.:  [id number]   MEDICAL RECORD NO.:  [id number]          PATIENT TYPE:  INP   LOCATION:  5025                         FACILITY:  [facility]   PHYSICIAN:  Lubertha Basque. Dalldorf, M.D.DATE OF BIRTH:  18-Jun-1961   DATE OF ADMISSION:  06/18/2004  DATE OF DISCHARGE:  06/22/2004                                 DISCHARGE SUMMARY   ADMITTING DIAGNOSES:  1.  Status post left total knee replacement with loose left tibial      component.  2. Status post right total knee replacement.  3.      Hypertension.   DICTATION ENDED HERE.       MC/MEDQ  D:  07/11/2004  T:  07/11/2004  Job:  [id number]

## 2011-02-07 NOTE — Op Note (Signed)
[facility]  Patient:    [name], [name] Visit Number: [id number] MRN: [id number]          Service Type: DSU Location: [facility] Attending Physician:  Marcene Corning Dictated by:   Lubertha Basque. Jerl Santos, M.D. Proc. 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  OPERATION PERFORMED:  Right knee chondroplasty, medial and patellofemoral compartments.  ANESTHESIA:  Knee block and MAC.  ATTENDING SURGEON:  Lubertha Basque. Jerl Santos, M.D.  ASSISTANT:  Lindwood Qua, P.A.  INDICATIONS FOR PROCEDURE:  The patient is a 50 year old woman with a long history of right knee pain.  This persisted despite oral anti-inflammatories and injections of cortisone and Synvisc.  She has undergone several arthroscopies in the past.  She is offered a repeat arthroscopy at this point.  The procedure was discussed with the patient and informed operative consent was obtained after discussion of possible complications of reaction to anesthesia and infection.  DESCRIPTION OF PROCEDURE:  The patient was taken to an operating suite where knee block was applied without difficulty.  She was also given MAC.  She was then positioned supine and prepped and draped in normal sterile fashion. After the administration of preop IV antibiotics, an arthroscopy of the right knee was performed through two inferior portals.  Suprapatellar pouch was benign except for some cartilaginous loose bodies which were removed.  The patellofemoral joint exhibited grade 3 chondromalacia across most of the patella with the groove itself benign.  A thorough chondroplasty was done.  In the medial compartment, she also had grade 3 change across a good portion of the medial  femoral condyle but the meniscus itself was benign.  The lateral compartment was completely benign.  The ACL and PCL appeared intact.  The knee was thoroughly irrigated at the end of the case followed by placement of Marcaine with epinephrine and morphine.  Adaptic was placed over the portals followed by dry gauze and a loose Ace wrap.  Estimated blood loss and intraoperative fluids can be obtained from Anesthesia records.  DISPOSITION:  The patient was extubated in the operating room and taken to the recovery room in stable condition.  Plans were for her to go home the same day and to follow up in the office in less than a week.  I will contact her by phone tonight. Dictated by:   Lubertha Basque Jerl Santos, M.D. Attending Physician:  Marcene Corning DD:  03/29/02 TD:  03/31/02 Job: [id number]

## 2011-02-07 NOTE — Op Note (Signed)
NAME:  [name], [name]               ACCOUNT NO.:  [id number]   MEDICAL RECORD NO.:  [id number]          PATIENT TYPE:  INP   LOCATION:  5025                         FACILITY:  [facility]   PHYSICIAN:  Lubertha Basque. Dalldorf, M.D.DATE OF BIRTH:  1961-07-07   DATE OF PROCEDURE:  06/18/2004  DATE OF DISCHARGE:                                 OPERATIVE REPORT   PREOPERATIVE DIAGNOSIS:  Loose painful right knee replacement.   POSTOPERATIVE DIAGNOSIS:  Loose painful right knee replacement.   PROCEDURE:  Right knee replacement revision.   ANESTHESIA:  General.   SURGEON:  Lubertha Basque. Jerl Santos, M.D.   ASSISTANT:  1.  Charlesetta Shanks, M.D. and Lindwood Qua, P.A.   INDICATIONS FOR PROCEDURE:  The patient is a 50 year old woman who is more  than one year from a right knee replacement.  She has well-placed  components, but has persisted with pain, especially on the anterior aspect  of her shin.  This has persisted despite therapy and bracing and even an  injection.  By bone scan, she has evidence of a loosening of the tibial  component, and she appears to have some mild loosening even on the plain  films.  She has undergone a preoperative culture which is negative.  She is  offered a revision operation.  An informed operative consent was obtained,  after a discussion of the possible complications of, reaction to anesthesia,  infection, deep venous thrombosis, pulmonary embolus and death.   DESCRIPTION OF PROCEDURE:  The patient was taken to the operating suite  where general anesthetic was applied without difficulty.  She was positioned  supine and prepped and draped in the normal sterile fashion.  After the  administration of preoperative IV antibiotic, the right leg was elevated,  exsanguinated, and the tourniquet inflated about the thigh.  All appropriate  anti-infective measures were used, including the preoperative IV antibiotic,  Betadine-impregnated drape, and closed-hooded exhaust system for  each member  of the surgical team.  Her old longitudinal anterior incision was utilized  and dissection down to the extensor mechanism.  A medial parapatellar  incision was made through the structure.  She had an abundance of scar  tissue throughout the knee.  The entire patellar component was covered with  scar tissue, with none of this component left exposed.  This was all  removed.  She also had scarring of the patellar tendon down to the tibia and  the anterior aspect of the knee replacement.  This was all taken apart.  Eventually everything was well-exposed.  I used an osteotome to remove the  polyethylene spacer.  We then used the same osteotome and things seemed to  delaminate very easily between the prosthesis and the cement at that  interface.  The cement to bone interface appeared to be stable.  All this  cement was removed with rongeurs and osteotomes, with very little loss of  bone.  The tibial canal was reamed appropriately.  A revision cut was made  with the intramedullary guide.  A trial reduction was done with the size 3  component with a size 14  x 75 stem and a 12.5 mm spacer.  She was a little  bit unstable with this in place, and the 15 mm was more stable, but she had  so much difficulty with the opposite knee in terms of flexion.  We decided  to set her up fairly loose and used the 12.5 mm.  The trial component was  removed.  Cement was mixed, including the antibiotic, and was placed along  the under-surface of the Dupuy LCS revision stem which was size 3 with a 14  x 75 stem attached.  A small amount of cement was placed down the canal as  well.  Excess cement was trimmed and pressure was held on the component  until the cement had hardened.  The 12.5 mm spacer was then placed on top of  the knee, reduced.  She was stable to drawer testing in a flexed position.  She did hyperextend significantly.  The knee was irrigated and the  tourniquet deflated.  A small amount of  bleeding was controlled with Bovie  cautery.  A drain was placed, exiting superolaterally, followed by  reapproximation of the extensor mechanism with #1 Vicryl in an interrupted  fashion.  The subcutaneous tissues were reapproximated in two layers with  Vicryl, followed by a skin closure with staples.  Adaptic was applied to the  wound, followed by dry gauze and a loose Ace wrap.  The estimated blood loss  and the perioperative fluids can be obtained from the anesthesia records, as  can an accurate tourniquet time.   DISPOSITION:  The patient was extubated in the operating room and taken to  the recovery room in stable condition.   PLAN:  For her to be admitted to the orthopedic surgery service for  appropriate postoperative care to include perioperative antibiotics and  Coumadin, plus Lovenox for deep venous thrombosis prophylaxis.  At the end  of the case she bent to 120 degrees.       PGD/MEDQ  D:  06/18/2004  T:  06/18/2004  Job:  [id number]

## 2011-02-07 NOTE — Discharge Summary (Signed)
[name], [name]               ACCOUNT NO.:  [id number]   MEDICAL RECORD NO.:  [id number]          PATIENT TYPE:  INP   LOCATION:  9320                          FACILITY:  [facility]   PHYSICIAN:  Osborn Coho, M.D.   DATE OF BIRTH:  1960-10-08   DATE OF ADMISSION:  07/13/2006  DATE OF DISCHARGE:  07/15/2006                                 DISCHARGE SUMMARY   DISCHARGE DIAGNOSES:  1. Menorrhagia.  2. Fibroid uterus.  3. Severe dysmenorrhea.  4  Pelvic pain.   OPERATION:  On the date of admission, the patient underwent a  laparoscopically assisted vaginal hysterectomy with bilateral salpingo-  oophorectomy, tolerating all procedures well.  The patient had a uterus and  cervix weighing an aggregate weight of 187 g, along with normal-appearing  bilateral tubes and ovaries, all of which were sent to Pathology.   HISTORY OF PRESENT ILLNESS:  [name] is a 50 year old married African  American female who was status post bilateral tubal ligation, para 3-0-1-3,  who presented for an laparoscopically assisted vaginal hysterectomy with  bilateral salpingo-oophorectomy because of menorrhagia, severe dysmenorrhea  and left lower quadrant pelvic pain.  Please see the patient's dictated  history and physical examination for details.   PREOPERATIVE PHYSICAL EXAM:  VITAL SIGNS:  Blood pressure 168/96, height is  5-feet 7-inches tall.  Weight was 243.  GENERAL:  Exam was within normal limits.  PELVIC:  EG/BUS were normal.  Vagina was normal.  Cervix was nontender  without lesions.  Uterus appeared approximately 10 weeks' size and was  tender.  Her adnexa were without masses or tenderness.   HOSPITAL COURSE:  On the day of admission, the patient underwent  aforementioned procedures, tolerating them all well.  Postoperative course  was marked by the patient having slow resumption of bowel function,  otherwise unremarkable.  Postop hemoglobin was 11.4 (preop hemoglobin 13.7).  By postop day #2,  the patient had resumed bowel and bladder function and was  therefore deemed ready for discharge home.   DISCHARGE MEDICATIONS:  1. Chantix taken as directed for smoking cessation.  2. Colace 100 mg twice daily until bowel movements are regular.  3. Percocet 5/325 one to two tablets every 6 hours as needed for pain.   The patient was also given a copy of the home medication reconciliation  form.   FOLLOWUP:  The patient is scheduled for a postop visit with Dr. Su Hilt on  August 24, 2006 at 2 p.m.   DISCHARGE INSTRUCTIONS:  The patient was given a copy of [facility] postoperative instruction sheet.  She was further advised to avoid  driving for 2 weeks, heavy lifting for 4 weeks, intercourse for 6 weeks,  that she may walk up steps, may shower is to increase her activities slowly.  Diet should be low sodium.   FINAL PATHOLOGY:  Uterus, cervix, bilateral fallopian tubes and ovaries:  Cervix -- Nabothian cyst; endometrium -- proliferative, no hyperplasia or  carcinoma identified; leiomyomata -- intramural.  Right and left fallopian  tubes -- unremarkable; right and left ovaries -- benign follicular cyst, no  endometriosis or evidence of malignancy.      [name].      Osborn Coho, M.D.  Electronically Signed    EJP/MEDQ  D:  07/30/2006  T:  07/31/2006  Job:  [id number]

## 2011-02-07 NOTE — H&P (Signed)
NAME:  [name], [name]               ACCOUNT NO.:  [id number]   MEDICAL RECORD NO.:  [id number]          PATIENT TYPE:  AMB   LOCATION:  SDC                           FACILITY:  [facility]   PHYSICIAN:  Osborn Coho, M.D.   DATE OF BIRTH:  09-Jan-1961   DATE OF ADMISSION:  07/13/2006  DATE OF DISCHARGE:                                HISTORY & PHYSICAL   HISTORY OF PRESENT ILLNESS:  Ms. [name] is a 50 year old married African-  American female who is status post bilateral tubal ligation, para 3-0-1-3,  who presents for a laparoscopically-assisted vaginal hysterectomy with  bilateral salpingo-oophorectomy because of menorrhagia, severe dysmenorrhea,  and left lower quadrant pelvic pain.  For almost a year the patient has  experienced left lower quadrant pelvic pain with increased cramping  associated with her menses.  The patient's cramping precedes her menses as  much as 2 weeks before the actual flow and is rated as a 9/10 on a 10-point  pain scale.  The patient states this pain is sharp, that it radiates down  the back of her leg and for the past 3 months has minimally responded to  narcotic pain medication.  The patient's actual menstrual flow will last  approximately 5 days; however, some months she has to change her clothing  due to soilage as many as 6 times per day, while at other times she only has  spotting for her actual flow.  An endometrial biopsy done in August 2007  returned proliferative-type endometrium with breakdown.  The patient  reports having been seen at the emergency department, at which time she was  told that she had an enlarged uterus and therefore was diagnosed with  uterine fibroids.  She admits to dyspareunia but denies any changes in her  bowel habits, vaginitis symptoms, nausea or vomiting, or fever.  A TSH,  prolactin and CBC done in August 2007 all returned normal.  A review of  medical and surgical options was presented to the patient; however, after  careful  consideration and due to the disruptive nature of her symptoms, she  has decided to proceed with definitive therapy in the form of hysterectomy.   PAST MEDICAL HISTORY:  OB history:  Gravida 4, para 3-0-1-3.  The patient  had three spontaneous vaginal births, full-term, with one spontaneous  abortion.   GYN history:  Menarche 49 years old, her last menstrual period was June 10, 2006.  She uses bilateral tubal ligation as her method of contraception.  Denies any history of abnormal Pap smears or sexually transmitted diseases.  Her last normal Pap smear was in 2006 and normal mammogram was February  2007.   Medical history:  Hypertension, degenerative joint disease, peptic ulcer  disease, and uterine fibroids.   Surgical history:  In 1975, tonsillectomy; in 1985, left breast nodule  excision, cyst/benign; in 1989, excision of right hand tumor, benign; in  1985, bilateral tubal ligation.  Between 1994 and 2005, the patient had  multiple surgeries on both knees to include bilateral knee replacement.  In  1993, D&C for spontaneous abortion.  She denies  any problems with anesthesia  or history of blood transfusion.   FAMILY HISTORY:  Cardiovascular disease and stomach cancer and hypertension.   SOCIAL HISTORY:  The patient is disabled, and she lives with her husband.   HABITS:  She smokes 1 pack of cigarettes every 2-3 days.  Denies any alcohol  or illicit drug intake.   CURRENT MEDICATIONS:  1. Diovan 320 mg daily.  2. Metoprolol 50 mg daily.  3. Prevacid 30 mg daily.  4. Percocet one to two tablets every 4-6 hours as needed for pain.  5. Restoril 30 mg at bedtime as needed.  6. Aspirin 81 mg daily.  7. Fentanyl patch 25 mcg every 72 hours.   The patient is allergic to CODEINE, which causes swelling, nausea and  vomiting.   REVIEW OF SYSTEMS:  The patient denies any chest pain, shortness of breath,  nausea, vomiting, diarrhea, fever, and except as mentioned in history of   present illness, the remainder of the review of systems is negative.   PHYSICAL EXAMINATION:  VITAL SIGNS:  Blood pressure 168/96, height is 5 feet  7 inches tall, weight 243 pounds.  HEENT:  Pupils are equal.  Hearing normal.  Throat clear.  NECK:  Thyroid is not enlarged.  CARDIAC:  Regular rate and rhythm.  CHEST:  Clear.  BACK:  No CVA tenderness.  ABDOMEN:  No tenderness, masses or organomegaly.  EXTREMITIES:  No clubbing, cyanosis, or edema.  PELVIC:  EG, BUS is normal.  Vagina is normal.  Cervix is nontender, no  lesions.  Uterus approximately 10 weeks' size and tender.  Adnexa:  No  masses or tenderness.   IMPRESSION:  1. Menorrhagia.  2. Severe dysmenorrhea.  3. Pelvic pain.   DISPOSITION:  A discussion was held with the patient regarding the  indications for her procedures along with their risks, which include but are  not limited to reaction to anesthesia, damage to adjacent organs, infection,  excessive bleeding, that her pain may not totally be relieved by this  procedure, and that her surgery may have to be performed via an abdominal  incision.  The patient verbalized understanding of the risks and has  consented to proceed with a laparoscopically-assisted vaginal hysterectomy  with bilateral salpingo-oophorectomy with possible laparotomy, at [facility] on July 13, 2006, at 9 a.m.      [name].      Osborn Coho, M.D.  Electronically Signed    EJP/MEDQ  D:  07/10/2006  T:  07/10/2006  Job:  [id number]

## 2011-02-07 NOTE — Cardiovascular Report (Signed)
NAME:  [name], [name]               ACCOUNT NO.:  [id number]   MEDICAL RECORD NO.:  [id number]          PATIENT TYPE:  INP   LOCATION:  3704                         FACILITY:  [facility]   PHYSICIAN:  Arturo Morton. Riley Kill, MD, FACCDATE OF BIRTH:  1961/08/23   DATE OF PROCEDURE:  08/24/2006  DATE OF DISCHARGE:                            CARDIAC CATHETERIZATION   INDICATIONS:  [name] is a 50 year old who presents with recurrent  episodes of substernal chest pain.  She has been significantly  hypertensive.  They have been working on getting her blood pressure  down.  She has continued to have episodes of pain. Her cardiac markers  have been essentially negative.  The D-dimer was borderline.  Her K was  low on admission. The EKG does not show ST-segment changes.  The current  study was done to define her coronary anatomy.  The procedure was set up  by Dr. Ladona Ridgel.   PROCEDURE:  1. Left heart catheterization.  2. Selective coronary arteriography.  3. Selective left ventriculography.  4. Distal aortography.   DESCRIPTION OF PROCEDURE:  The patient was brought to the  catheterization laboratory and prepped and draped in usual fashion.  Through an anterior puncture the right femoral artery was easily  entered.  A 5-French sheath was placed.  We gave intra-aortic  nitroglycerin at about 100 mcg.  Following this we took views of the  left coronary artery.  Additional nitroglycerin was given for right  coronary artery injection.  She tolerated this well as well.  Central  aortic and left ventricular pressures were measured with a straight  pigtail catheter and ventriculography was done in the RAO projection.  Following this a distal shot was done because the patient has  substantial hypertension.  The renal arteries appeared to be patent.   HEMODYNAMIC DATA.:  1. Central aortic pressure 119/86, after the nitroglycerin      administration.  The mean pressure was 101.  2. Left ventricular  pressure 178/16.  3. No gradient pullback across aortic valve.   ANGIOGRAPHIC DATA.:  1. Ventriculography was done in the RAO projection.  Overall systolic      function is preserved.  No segmental abnormalities contraction      identified.  There did not appear to be significant mitral      regurgitation.  2. Distal aortography did reveal widely patent renal arteries.  The      iliacs appear intact.  3. The right coronary artery is fairly large-caliber vessel with an      acute marginal branch that is somewhat larger posterior descending      and posterolateral system.  There is minimal irregularity just      after the ostium.  The remainder of the vessels without critical      narrowing.  4. The left main is free of critical disease.  5. The LAD courses to the apex and provides a bifurcating diagonal      branch.  Other than minor luminal irregularity.  No critical      stenoses are noted.  6. The circumflex proper demonstrates some minor  irregularity of the      first marginal branch but the rest of the vessel is otherwise      completely smooth.   CONCLUSION:  1. Systemic hypertension.  2. Preserved overall left ventricular function without wall motion      abnormality.  3. No high-grade coronary obstructions.   DISPOSITION:  The patient may need risk factor reduction and lipid  management.  She should discontinue smoking.      Arturo Morton. Riley Kill, MD, [facility]  Electronically Signed     TDS/MEDQ  D:  08/24/2006  T:  08/24/2006  Job:  [id number]

## 2011-02-07 NOTE — Op Note (Signed)
[place]  Patient:    Diane, [name] Visit Number: [id number] MRN: [id number]          Service Type: SUR Location: [id number] Attending Physician:  Marcene Corning Dictated by:   Lubertha Basque. Jerl Santos, M.D. Proc. Date: 10/12/01 Admit Date:  10/12/2001                             Operative Report  PREOPERATIVE DIAGNOSIS:  Painful left unicondylar knee replacement.  POSTOPERATIVE DIAGNOSIS:  Painful left unicondylar knee replacement.  OPERATION PERFORMED:  Left knee revision, total knee replacement.  ANESTHESIA:  General.  ATTENDING SURGEON:  Lubertha Basque. Jerl Santos, M.D.  ASSISTANT: 1. Jearld Adjutant, M.D. 2. Lindwood Qua, P.A.  INDICATIONS FOR PROCEDURE:  The patient is a 50 year old woman about six or eight months from a unicondylar knee replacement.  She initially achieved fairly good pain relief, but then this failed.  She had difficulty with ambulation and with rest.  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 conversion.  The procedure was discussed with the patient and informed operative consent was obtained after discussion of possible complications of reaction to anesthesia, infection, deep vein thrombosis, pulmonary embolus and death.  DESCRIPTION OF PROCEDURE:  The patient was taken to an operating suite where general anesthetic was applied without difficulty.  She was then positioned supine and prepped and draped in normal sterile fashion.  After administration of preop IV antibiotics, the left leg was elevated, exsanguinated, and a tourniquet inflated about the thigh.  Her entire old incision was used with extension in both directions.  Dissection was carried down to the extensor mechanism and a median parapatellar incision was made through this structure. The knee cap was flipped 180 degrees and the knee flexed.  All of her components  appeared to be stable.  There did appear to be some patellofemoral degeneration with some significant softening of the cartilage which appeared close to the delamination.  The lateral compartment was completely benign. She had excellent bone quality and intact ACL and PCL.  The femoral and tibial components were easily removed along with the cement.  A revision cut was made on the tibia with an extramedullary guide.  This was made flush with the medial cement and taken laterally.  The femur sized to a standard plus and a guide was placed to make an anterior and posterior cut on the femur creating a flexion gap at 12.5 mm.  A second intramedullary guide was placed into the femur to create extension cut on the distal femur creating an equal extension gap at 12.5 mm.  Some minimal soft tissue balancing was required.  The Chamfer guide was applied to the femur and the standard plus cuts were made.  The tibia sized to a size 3 component and the appropriate guide was placed and utilized.  Trial components were placed which were of the Depuy LCS system. She came easily to extension and had good ligamentous stability. The knee cap tracked well and no lateral release was required.  About 9 mm of the undersurface of the patella were removed followed by placement of a 3-peg patella trial.  Trial components were removed.  Cement was mixed including antibiotic.  Pulsatile lavage was used to cleanse all bony surfaces followed by pressurization of the cement into all three bony surfaces and placement of the Depuy LCS  components which were standard plus femur, standard plus patella, size 3 tibia, and 12.5 insert.  Pressure was held on the components until the cement had hardened and excess was trimmed.  The knee again tracked well and came to full extension easily.  The tourniquet was deflated and a small amount of bleeding was easily controlled with Bovie cautery.  A drain was placed exiting  superolaterally.  The wound was again irrigated.  The extensor mechanism was reapproximated with #1 Vicryl in interrupted fashion. Subcutaneous tissues were reapproximated with 0 Vicryl and 2-0 undyed Vicryl followed by skin closure with staples.  The knee easily flexed to  120 degrees at the end of the case passively.  Adaptic was placed on the wound followed by dry gauze and a loose Ace wrap.  Estimated blood loss and intraoperative fluids as well as accurate tourniquet time can be obtained from Anesthesia records.  DISPOSITION:  The patient was extubated in the operating room and taken to the recovery room in stable condition.  Plans were for her to the orthopedic surgery service for appropriate postoperative care to include perioperative antibiotics and Coumadin for deep vein thrombosis prophylaxis. Dictated by:   Lubertha Basque Jerl Santos, M.D. Attending Physician:  Marcene Corning DD:  10/12/01 TD:  10/13/01 Job: [id number]

## 2011-02-07 NOTE — Op Note (Signed)
[place]. [facility]  Patient:    [name], [name]                        MRN: [id number] Proc. Date: 01/02/00 Attending:  Lubertha Basque. Jerl Santos, M.D.                           Operative Report  PREOPERATIVE DIAGNOSIS:  Bilateral arthritis, knees.  POSTOPERATIVE DIAGNOSIS:  Bilateral arthritis, knees.  OPERATION PERFORMED: 1. Left knee chondroplasty. 2. Left knee removal of loose bodies. 3. Right knee chondroplasty. 4. Right knee removal of loose bodies.  ANESTHESIA:  General.  ATTENDING SURGEON:  Lubertha Basque. Jerl Santos, M.D.  ASSISTANT:  Lindwood Qua, P.A.  INDICATIONS FOR PROCEDURE:  The patient is a 50 year old woman with a long history of bilateral knee pain.  This has persisted despite multiple different oral anti-inflammatories and multiple injections.  At this point she is offered operative intervention to consist of arthroscopies.  She has had arthroscopy on the right knee twice in the past, most recently being in 1999. The procedure was discussed with the patient and informed operative consent was obtained after discussion of possible complications of reaction to anesthesia and infection.  DESCRIPTION OF PROCEDURE:  The patient was taken to an operating suite where general anesthetic was applied without difficulty.  She was then positioned supine and prepped and draped in normal sterile fashion.  After administration of preop intravenous antibiotics, an arthroscopy of the left knee was performed through two inferior portals.  The suprapatellar pouch was benign except for some cartilaginous loose bodies which were removed.  The same was true of both gutters.  The medial compartment exhibited several flaps of cartilage and grade 3 change across the medial femoral condyle.  This was addressed with a thorough chondroplasty back to stable structures.  The meniscal cartilage appeared benign.  In the lateral compartment, there was no evidence of  meniscal or articular cartilage injury but she again had some cartilaginous loose bodies which were removed.  In the notch she had an intact ACL and PCL.  The patella exhibited grade 3 change as well and was addressed with a thorough chondroplasty.  The knee joint was thoroughly irrigated at the end of the case followed by placement of Marcaine with epinephrine and morphine and Depo-Medrol.  Adaptic was placed over the two portals followed by dry gauze and a loose Ace wrap.  Attention was turned toward the right knee where her two old inferior portals were used.  The findings were basically identical and thorough chondroplasty with loose body removal was performed. This joint was thoroughly irrigated at the end of the case followed by placement of Marcaine with epinephrine and morphine and Depo-Medrol.  Adaptic was again placed over these portals followed by dry gauzed with loose Ace wrap.  Estimated blood loss and intraoperative fluids can be obtained from Anesthesia records.  DISPOSITION:  The patient was taken to the recovery room in stable condition. Plans were for her to go home the same day and to follow up in the office in less than a week.  I will contact her by phone tonight. DD:  01/02/00 TD:  01/02/00 Job: [id number]

## 2011-02-07 NOTE — Op Note (Signed)
NAME:  KAEDENCE, CONNELLY                         ACCOUNT NO.:  [id number]   MEDICAL RECORD NO.:  [id number]                   PATIENT TYPE:  INP   LOCATION:  5001                                 FACILITY:  [facility]   PHYSICIAN:  Lubertha Basque. Jerl Santos, M.D.             DATE OF BIRTH:  06/24/1961   DATE OF PROCEDURE:  04/25/2003  DATE OF DISCHARGE:                                 OPERATIVE REPORT   POSTOPERATIVE DIAGNOSIS:  Right knee degenerative arthritis.   POSTOPERATIVE DIAGNOSIS:  Right knee degenerative arthritis.   PROCEDURE:  Right total knee replacement.   ANESTHESIA:  General.   ATTENDING SURGEON:  Lubertha Basque. Jerl Santos, M.D.   ASSISTANT:  Prince Rome, P.A.   INDICATIONS FOR PROCEDURE:  The patient is a 50 year old woman with a many  year history of bilateral knee pain.  She is status post a very complicated  set of procedures on the operative knee which eventually led to a  satisfactory result.  Unfortunately, she was left with bad pain on the right  side.  She has failed various oral anti-inflammatories, injections of  cortisone and other substances, and an arthroscopy.  She is known to have  significant degenerative change of the patellofemoral and medial  compartment.  She has been left with pain at rest and pain with activity and  has opted for knee replacement.  Informed operative consent was obtained  after discussion of possible complications of reaction to anesthesia,  infection, DVT, PE, and death.   DESCRIPTION OF PROCEDURE:  The patient was seen in the operating suite where  general anesthetic was applied without difficulty.  She was positioned  supine and prepped and draped in the normal sterile fashion.  After the  administration of IV antibiotics, her right leg was elevated, exsanguinated,  and a tourniquet inflated about the thigh.  An anterior longitudinal  incision was made with dissection down to the extensor mechanism.  All  appropriate anti-infective  measures were used including closed __________  from the surgical team, preoperative IV antibiotic, and Betadine-impregnated  drape.  A medial patellar approach was taken through the extensor mechanism.  The knee cap was flipped and the knee flexed.  She had end-stage  patellofemoral, moderate medial compartment change.  She had excellent bone  quality.  Some residual meniscal tissues in the ACL and PCL were excised.  I  then made a cut on the tibia parallel with the floor with an extramedullary  guide.  An intramedullary guide was then placed into the femur to create  anterior and posterior cuts, making a flexion gap of 10 mm.  A second  intramedullary guide was placed into the femur in order to make a distal cut  on the femur, creating an equal extension gap of 10 mm.  The femur sized to  a standard plus component, and appropriate chamfer guide was placed and  utilized.  The genu sized  to a 3, and the appropriate guide was placed and  utilized.  The patella was downsized with about 10 mm and seemed to fit a 32  component best.  A guide was placed and utilized for the patella as well.  A  trial reduction was done with all the aforementioned components.  She came  into hyperextension, and we fed her up fairly loose, but she does have a  very stiff opposite knee.  Trial components were removed.  Pulsatile lavage  was used to irrigate all cut bony surfaces.  Cement was mixed including the  antibiotic; cement was pressurized in all three cut bony surfaces.  The  aforementioned DePuy components were placed which were LCS standard plus  femur, 3 tibia, 32 all-poly patella, and 10 mm deep dish spacer.  Pressure  with all of the components full of cement and hardened, and excess cement  was trimmed.  Tourniquet was deflated, and a small amount of bleeding as  controlled with electrocautery.  The knee was again irrigated, followed by  placement of the drain exiting superolaterally.  The extensor  mechanism was  reapproximated with #1 Vicryl in an interrupted fashion.  The knee then  flexed past 120 degrees.  Subcutaneous tissues and the deep structures were  reapproximated with 0 and 2-0 undyed Vicryl, followed with inclusion  staples.  Adaptic was applied to the wound, followed by dry gauze and loose  Ace wrap.  Estimated blood loss and intraoperative fluids can be obtained  from the anesthesia records as can accurate tourniquet time.   DISPOSITION:  The patient was extubated in the operating room and taken to  the recovery room in stable condition.  Plans were for her to be admitted to  the orthopedic surgery service for appropriate postop care to include  postoperative antibiotics and Coumadin plus Lovenox for DVT prophylaxis.                                               Lubertha Basque Jerl Santos, M.D.    PGD/MEDQ  D:  04/25/2003  T:  04/25/2003  Job:  [id number]

## 2011-02-07 NOTE — Discharge Summary (Signed)
NAME:  [name], [name]               ACCOUNT NO.:  [id number]   MEDICAL RECORD NO.:  [id number]          PATIENT TYPE:  INP   LOCATION:  3704                         FACILITY:  [facility]   PHYSICIAN:  Valetta Mole. Swords, M.D. Romualdo Bolk OF BIRTH:  [birth year]   DATE OF ADMISSION:  01/09/2006  DATE OF DISCHARGE:  01/10/2006                                 DISCHARGE SUMMARY   DISCHARGE DIAGNOSES:  1.  Chest pain, likely noncardiac.  Presumed musculoskeletal chest pain.      Stress Myoview is pending.  2.  Hyperlipidemia.  3.  Hypertension.  4.  History of gastroesophageal reflux disease.  5.  Degenerative joint disease.  6.  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  7.  History of left knee replacement x3.  8.  Gout.  9.  Tobacco abuse.   DISCHARGE MEDICATIONS:  1.  Indomethacin 50 mg p.o. t.i.d.  2.  Skelaxin 800 mg t.i.d.  3.  Diovan 320 mg p.o. daily.  4.  Prevacid 30 mg p.o. daily.  5.  Simvastatin 20 mg p.o. daily.  6.  Metoprolol 25 mg b.i.d.  7.  Aspirin 325 mg daily.   FOLLOW UP PLANS:  Follow up with Dr. Clent Ridges in 1-2 weeks.   CONDITION ON DISCHARGE:  Improved with decreased chest discomfort.   HOSPITAL PROCEDURES:  1.  Chest x-ray on January 08, 2006 demonstrated no acute cardiopulmonary      disease.  2.  Myoview is pending.   HOSPITAL LABORATORIES:  Uric acid normal at 5.  TSH on January 10, 2006 is  slightly suppressed at 0.112 (normal 0.35 to 5.5).  Cardiac enzymes remain  negative during hospitalization.  CBC on January 10, 2006 revealed a  hemoglobin of 12.2, white count of 7.3, platelet count of 280,000.  BMET on  January 10, 2006 was normal, except for a glucose of 120.   HOSPITAL COURSE:  1.  Chest pain.  The patient was admitted with chest discomfort.  Multiple      risk factors including hypertension, obesity, hyperlipidemia.  The      patient was seen in consultation by cardiology.  Myoview is pending.  2.  Hypertension.  The patient was maintained on Diovan in the  hospital.      Additional medications added include metoprolol 25 mg p.o. b.i.d.  3.  Hyperlipidemia.  The patient was started on simvastatin 20 mg p.o.      daily.  4.  Polypharmacy on admission.  The patient on indomethacin and Mobic.  I do      not see any reason for her to be      on 2 nonsteroidal antiinflammatory drugs.  She is advised to discontinue      Mobic.  5.  Endocrine.  Note slightly depressed TSH.  This will need to be followed      up as an outpatient.  The patient is instructed to have that followed      up.  I do not think any evaluation needs to happen here in the hospital.      Bruce H. Swords,  M.D. [facility]  Electronically Signed     BHS/MEDQ  D:  01/10/2006  T:  01/10/2006  Job:  [id number]   cc:   Jeannett Senior A. Clent Ridges, M.D. [facility]  [address] [place]  [place] [address]

## 2011-02-07 NOTE — Op Note (Signed)
NAME:  [name], [name]                         ACCOUNT NO.:  [id number]   MEDICAL RECORD NO.:  [id number]                   PATIENT TYPE:  INP   LOCATION:  2550                                 FACILITY:  [facility]   PHYSICIAN:  Lubertha Basque. Jerl Santos, M.D.             DATE OF BIRTH:  01/30/1961   DATE OF PROCEDURE:  11/15/2002  DATE OF DISCHARGE:                                 OPERATIVE REPORT   PREOPERATIVE DIAGNOSES:  Painful left total knee replacement.   POSTOPERATIVE DIAGNOSES:  Painful left total knee replacement.   OPERATION PERFORMED:  Revision, left total knee replacement.   SURGEON:  Lubertha Basque. Jerl Santos, M.D.   ASSISTANT:  1. Deidre Ala, M.D.  2. Lindwood Qua, P.A.   ANESTHESIA:  General.   INDICATIONS FOR PROCEDURE:  The patient is a 50 year old woman now more than  a year out from a left knee replacement.  She has persisted with pain since  the procedure and has also been bothered by stiffness.  With pain at rest  and pain with activity, she was thoroughly worked up.  We performed an  aspirate of the knee and found no evidence of any sort of infection.  A bone  scan was obtained which showed increased uptake around the tibial component  consistent with loosening.  She was offered a revision operation with  replacement of the tibial component.  The procedure was discussed with the  patient and informed operative consent was obtained after discussion of  possible complications of reaction to anesthesia, infection, DVT, PE, and  death.   DESCRIPTION OF PROCEDURE:  The patient was taken to the operating suite  where general anesthetic was applied without difficulty.  The patient was  positioned supine and prepped and draped in the normal sterile fashion.  Even asleep, her knee only bent about 50 degrees.  Her old incision was  utilized with dissection down to the extensor mechanism.  All appropriate  anti-infective measures were used including preoperative  intravenous  antibiotics, Betadine impregnated drape, then closed hooded exhaust systems  for each member of the surgical team. A medial parapatellar incision was  made through the extensor mechanism.  With a great deal of dissection and  struggle, the patella eventually flipped and the knee flexed past 90  degrees.  The tibial component did seem to be a bit loose.  The poly was  removed followed by use of osteotomes to remove the tibial component.  Cement was trimmed.  She had a large ridge of bone on the posterior aspect  of the tibia which could theoretically have been blocking some of her  flexion.  An intramedullary guide was placed and reaming was taken up to 16  mm of diameter and done by hand.  Appropriate guide was placed utilizing  this reamer post.  A cut was made on the tibia with a slight posterior tilt.  The tibia sized to  a size 3 component and the appropriate revision template  was placed.  Unfortunately, this did not seat fully and a revision cut was  required with a saw at which point, the trial component did fit fully.  A  trial reduction was done with a size 15 poly spacer.  The knee came to full  extension and flexed past 90.  Her knee cap tracked well.  The trial  components were removed.  A trial reduction was also done with a 12.5 and  her knee was felt to be very loose with this in place.  The trial components  were all removed along with the poly from the metal backed patella which was  not disturbed.  A new poly was placed onto the patella without much  difficulty.  Pulsatile lavage was used to cleanse the tibia followed by  mixing of cement including antibiotic.  The cement was then pressurized to  the tibial surface followed by placement of the size 3 revision cemented  tray from the [employer] Dep[employer] system.  The 15 mm poly was inserted on top.  Excess  cement was trimmed.  Pressure was held on the component until the cement had  all hardened.  The tourniquet was then  deflated and the leg moved well and  the patella tracked well.  The knee was then irrigated by placement of a  drain exiting superolaterally.  The extensor mechanism was reapproximated  with #1 Vicryl in interrupted fashion.  Subcutaneous tissues were  reapproximated with 0 and 2-0 undyed Vicryl in several layers followed by  staples in the skin.  Adaptic was applied to the wound followed by dry gauze  and a loose Ace wrap.  At closure, her knee flexed 90 degrees.  Estimated  blood loss and intraoperative fluids as well as accurate tourniquet time can  be obtained from anesthesia records.   DISPOSITION:  The patient was extubated in the operating room and taken to  the recovery room in stable condition.  Plans were for the patient  to be  admitted to the orthopedic surgery service for appropriate postoperative  care to include perioperative antibiotic and Coumadin plus Lovenox for DVT  prophylaxis.                                                 Lubertha Basque Jerl Santos, M.D.    PGD/MEDQ  D:  11/15/2002  T:  11/15/2002  Job:  [id number]

## 2011-02-07 NOTE — Assessment & Plan Note (Signed)
[facility]                              [facility] OFFICE NOTE   NAME:[name] [name] CRUME                      MRN:          [id number]  DATE:05/19/2006                            DOB:          05-Oct-1960    This is a 50 year old woman here for a nongynecological physical  examination.  Except for her pelvic pain, she is doing reasonably well and  has no particular complaints.  Temazepam works well for sleep and she asks  for a refill.  We have been following her for degenerative arthritis, acid  reflux disease, hypertension and hyperlipidemia.  She sees Dr. Geanie Kenning  for gynecology care.  She currently has been struggling with severe pelvic  cramping and vaginal bleeding. She has been diagnosed with uterine fibroids.  She saw a urologist several weeks ago to investigate the possibility of  renal stones.  He felt that this was not a problem.  His workup at that time  included a normal cystoscopy.  He then referred her back to Dr. Su Hilt, who  does agree that the uterine fibroids are the cause of her current problems.  She is due for a pelvic ultrasound through Dr. Les Pou office on May 21, 2006.  Other than that, she continues to try to get around as best she can.  She is disabled, but she does drive her car.  Of note, she had a normal  adenosine Myoview on January 10, 2006.  She continues to see Dr. Yisroel Ramming on a  regular basis for orthopedic concerns, and sees Dr. Allyne Gee for psychiatric  concerns.  For other details of her past medical history, family history,  social history, habits, etc., refer to her last physical note dated November 22, 2003.   ALLERGIES:  CODEINE.   CURRENT MEDICATIONS:  Diovan 320 mg per day, Skelaxin 800 mg t.i.d.,  Prevacid 30 mg, Solu-Tabs once a day, simvastatin 20 mg per day, metoprolol  50 mg b.i.d., aspirin 325 mg per day, Percocet 5/3.25 as needed for pain,  temazepam 30 mg q.h.s. for sleep and indomethacin 50 mg  t.i.d. as needed for  pain.   OBJECTIVE:  VITAL SIGNS:  Weight 242, blood pressure 150/100, pulse 80 and  regular.  GENERAL:  She is somewhat anxious today and remains significantly  overweight.  SKIN:  No lesions.  EYES:  Clear.  EARS:  Clear.  PHARYNX:  Clear.  NECK:  Supple without lymphadenopathy or masses.  LUNGS:  Clear.  CARDIAC:  Regular rate and rhythm without gallops, murmurs or rubs.  Peripheral pulses are full.  EKG is within normal limits.  ABDOMEN:  Soft, normal bowel sounds, no masses.  She is moderately tender in  both lower quadrants and above the pubis.  There is no rebound or guarding.  EXTREMITIES:  No clubbing, cyanosis or edema.  NEUROLOGIC:  Grossly intact.   ASSESSMENT/PLAN:  1. Complete physical.  We talked about increasing exercising and losing      weight.  2. Hypertension.  Probably stable.  Will continue to watch this closely.  3. Uterine fibroids.  Will  follow up with Dr. Su Hilt, who did give her an      injection of 60 mg of Toradol today to give her some temporary pain      relief.  4. Hyperlipidemia.  Patient had a fasting laboratories performed on April 30, 2006.  This was significant for an LDL elevated to 137, and HDL low      at 33, and a mildly elevated triglyceride at 152.  Otherwise, her labs      were within normal limits.  Will increase simvastatin to 40 mg per day      and she will continue to watch her diet.  5. Degenerative joint disease, stable.  6. Insomnia, stable.  Refilled temazepam for the coming six months.  7. Gastroesophageal reflux disease, stable.                                   Tera Mater. Clent Ridges, MD   SAF/MedQ  DD:  05/19/2006  DT:  05/20/2006  Job #:  [id number]

## 2011-02-07 NOTE — Discharge Summary (Signed)
[place]  Patient:    [name], [name] Visit Number: [id number] MRN: [id number]          Service Type: SUR Location: 5000 [id number] Attending Physician:  Marcene Corning Dictated by:   Prince Rome, P.A. 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  BRIEF HISTORY:  This is a 50 year old black female patient well known to our practice.  Within the past year, she has undergone arthroscopy of her left knee and then she had a unicompartmental medial side knee replacement done but this never resolved her pain issues and she basically is having pain with every step and is not able to progress past that.  We discussed treatment options with the patient and with her consent, proceeded with a total knee replacement removing the unicompartmental components.  PERTINENT LABORATORY AND X-RAY FINDINGS:  Serial protimes were drawn as she was on low dose Coumadin protocol.  Hemoglobin on the last testing was 10.2, hematocrit 29.6.  Other indices are included in the chart.  HOSPITAL COURSE:  She was admitted postoperatively, put on a variety of p.o. and IM analgesics for pain, low dose Coumadin protocol as regulated by pharmacy.  Physical therapy was ordered to be touchdown to progressive weightbearing as tolerated with the help of a walker or crutches.  Dressings were changed numerous times throughout her hospital stay and the wounds were benign.  She is discharged home.  CONDITION ON DISCHARGE:  She will have continued home physical therapy.  She will be on Coumadin for four weeks postoperatively, p.o. pain medications were given to her.  She can be weightbearing as tolerated.  Arrangements for home therapy were also  ordered. Dictated by:   Prince Rome, P.A. Attending Physician:  Marcene Corning DD:  11/15/01 TD:  11/15/01 Job: [id number]

## 2011-02-07 NOTE — Consult Note (Signed)
NAME:  [name], [name]               ACCOUNT NO.:  [id number]   MEDICAL RECORD NO.:  [id number]          PATIENT TYPE:  INP   LOCATION:  3704                         FACILITY:  [facility]   PHYSICIAN:  Jesse Sans. Wall, M.D.   DATE OF BIRTH:  27-Jun-1961   DATE OF CONSULTATION:  01/09/2006  DATE OF DISCHARGE:                                   CONSULTATION   We were asked by Dr. Amador Cunas to evaluate Carroll Sage, a 50 year old  African American female with multiple cardiac risk factors and chest pain.   She has been having some atypical chest pain described as a sharp stabbing  pain above her left breast.  She has also had some chest tightness but this  has not been typically with exertion and is short-lived.   She has no previous cardiac history or cerebrovascular disease history.   Her risk factors include hypertension, family history, LDL of greater than  130, tobacco use of a half pack per day, obesity, sedentary lifestyle and  HDL less than 40.   Her EKG here shows no ST segment changes.  Cardiac enzymes have been  negative.   CURRENT MEDICATIONS:  1.  Lopressor 12.5 b.i.d.  2.  Aspirin 325 a day.  3.  Diovan 320 daily.  4.  Indomethacin 50 mg p.o. t.i.d.  5.  Skelaxin 800 mg p.o. t.i.d.  6.  Protonix 40 mg p.o. daily.  7.  Heparin IV.   ALLERGIES:  She is intolerant of CODEINE which causes pruritus, NAPROXEN and  DILAUDID which cause pruritus.   PAST MEDICAL HISTORY:  1.  Gastroesophageal reflux.  2.  Peptic ulcer disease.  3.  Degenerative joint disease with five knee operations which has caused      her to be disabled and has bilateral total knees.  4.  Hypertension.  5.  Gout.  6.  Obesity.   SOCIAL HISTORY:  She lives in [place], [place], with her husband.  She has three children.  She walks but is somewhat limited.   FAMILY HISTORY:  Her father had a heart attack at age 58 and died at 107 of  heart failure.  She has one brother, 22, who has heart  disease. She has six  brothers and four sisters who are alive and well.   REVIEW OF SYSTEMS:  Negative other than the HPI.   PHYSICAL EXAMINATION:  GENERAL APPEARANCE:  She is in no acute distress.  VITAL SIGNS:  Blood pressure 127/78, pulse 55 and regular, respiratory rate  18, temperature 97.8, O2 saturation 98% on room air.  SKIN:  Warm and dry.  HEENT:  Normocephalic and atraumatic.  PERRLA.  Extraocular movements  intact. Sclerae are clear.  Dentition is satisfactory.  NECK:  No JVD.  Carotid upstrokes were equal bilaterally without bruits.  No  JVD.  Thyroid was not enlarged.  LUNGS:  Clear.  CARDIOVASCULAR:  Regular rate and rhythm without murmurs, rubs, or gallops.  ABDOMEN:  Soft with good bowel sounds.  No distension.  There is no  tenderness.  EXTREMITIES:  Without clubbing, cyanosis, or edema.  Pulses  are brisk.  NEUROLOGIC:  Intact.   Chest x-ray shows no acute cardiopulmonary process.   EKG shows sinus bradycardia with no ST segment changes.   TSH was normal.  Chemistries were normal.  D-dimer was not elevated.  Cardiac enzymes were negative.   ASSESSMENT/PLAN:  Atypical, which is probably noncardiac chest discomfort at  present.  However, she has multiple cardiac risk factors including  hypertension, family history of hyperlipidemia, low HDL, tobacco use,  sedentary lifestyle and obesity, so she probably has subclinical  nonobstructive disease.   RECOMMENDATIONS:  1.  Adenosine Myoview.  If she has significant ischemia, cardiac      catheterization.  I have discussed with patient.  She understands our      recommendation and agrees to proceed.  2.  Statin to lower LDL below 100 with 60 to 70 being optimal with multiple      cardiac risk factors.  3.  Enteric coated aspirin daily.  4.  Discontinue tobacco.  5.  If  her stress Myoview is negative, I would discontinue her beta-blocker      as long as her blood pressure is under good control.      Thomas C.  Wall, M.D.  Electronically Signed     TCW/MEDQ  D:  01/09/2006  T:  01/12/2006  Job:  [id number]

## 2011-02-07 NOTE — H&P (Signed)
NAME:  [name], [name]               ACCOUNT NO.:  [id number]   MEDICAL RECORD NO.:  [id number]          PATIENT TYPE:  EMS   LOCATION:  MAJO                         FACILITY:  [facility]   PHYSICIAN:  Reginia Forts, MD     DATE OF BIRTH:  March 21, 1961   DATE OF ADMISSION:  01/08/2006  DATE OF DISCHARGE:                                HISTORY & PHYSICAL   PRIMARY CARE PHYSICIAN:  Tera Mater. Clent Ridges, M.D.   CHIEF COMPLAINT:  Chest pain.   HISTORY OF PRESENT ILLNESS:  This is a 50 year old African-American woman  with a strong family history for coronary disease, history of hypertension,  gout, and peptic ulcer disease who presents with one day of severe, sharp  chest pain.  Historically, over the last five to six months, the patient has  noted exertional chest pressure with dyspnea on exertion.  Episodes occurred  two times per week, each relieved with rest.  Significant exertion includes  walking half a block.  The patient has also noted a significant amount of  stress in the past one week and has developed separate sharp, stabbing pain  across her left chest radiating to the left arm.  These episodes occur  approximately three to four times over the last two weeks but became  progressively worse within the last 24 hours.  Episodes would last anywhere  from one minute up to one hour at a time with no correlation with exertion.  The patient does not take any nitroglycerin at home.  Due to the acute  worsening of the sharp chest pain which was separate from the patient's  underlying chest pressure, the patient presented to the emergency room.   In the ED, she received nitroglycerin and morphine with significant  improvement in her chest pain alleviating it from an 8/10 down to a 2/10.  The patient denies any orthopnea, palpitations, PND, syncope.  In regards to  her exertional substernal chest pressure, the patient has noted a worsening  of that slowly over the last five to six months; her last  episode being four  days ago.   PAST MEDICAL HISTORY:  1.  Notable for GERD and peptic ulcer disease.  2.  Degenerative joint disease.  3.  History of right knee replacement x2.  4.  Left knee replacement x3.  5.  Hypertension.  6.  Gout.   ALLERGIES:  CODEINE and DILAUDID causing itchiness.   MEDICATIONS:  1.  Diovan 320 mg once a day.  2.  Indomethacin 50 mg t.i.d.  3.  Mobic 15 mg once a day.  4.  Skelaxin 800 mg three times a day.  5.  Prevacid SOL once a day.   SOCIAL HISTORY:  The patient lives in [place] with her husband.  She is  disabled secondary to her knee surgeries.  She has three children who are  healthy.  She notes 24 years of smoking a half a pack a day.  Denies any  alcohol, drug use or herbal medications.  She is active with occasional  walking exercise regimen.   FAMILY HISTORY:  Notable for mother  with atrial fibrillation and is alive  and a father who first had a MI at age 5 and expired from congestive heart  failure.   REVIEW OF SYSTEMS:  Notable for chronic myalgias and arthralgias involving  the bilateral knees.  The rest of the 12 review of systems was reviewed and  is negative.   PHYSICAL EXAMINATION:  VITAL SIGNS:  Temperature 97.9, pulse 78, respiratory  16, blood pressure 165/99.  Saturating 97% on room air.  GENERAL:  The patient is awake, alert and in minimal distress.  Mildly  anxious appearing.  HEENT:  Normocephalic and atraumatic.  Pupils are equal, round, and reactive  to light.  Extraocular movements intact.  NECK:  No JVD, no carotid bruits, no lymphadenopathy.  CARDIOVASCULAR:  Regular rhythm, normal rate with no murmurs, gallops, or  rubs.  LUNGS:  Clear to auscultation bilaterally.  ABDOMEN:  Obese, positive bowel sounds.  Soft, nontender, and nondistended.  EXTREMITIES:  No clubbing, cyanosis, or edema.  There are 1+ distal pulses.  1+ femoral pulses with no bruits.  Trace bilateral lower extremity edema.  SKIN:  No rash.   MUSCULOSKELETAL:  Demonstrates mild tenderness to the right and left knee.  NEUROLOGICAL:  Cranial nerves II through XII grossly intact.  No focal  musculoskeletal or sensory deficits.   Chest x-ray demonstrates no acute cardiopulmonary process.  EKG demonstrates  normal sinus rhythm with a rate of 74 and nonspecific ST and T-wave changes  in the lateral leads.   LABS:  Troponin is negative x2 with a CK of 65 and MB of 1.  BUN is 8 and  creatinine is 1.0.  Hemoglobin is 14.6.   ASSESSMENT:  This is a 50 year old African-American woman with hypertension  and a strong family history of coronary disease who presents with a  combination of atypical sharp chest pain likely secondary to anxiety and  underlying stress or other noncardiac chest pain as well as an underlying  exertional chest pressure concerning for mildly worsening angina.   PLAN:  1.  Rule out myocardial infarction:  We will place the patient on heparin      protocol and start aspirin and 12.5 mg b.i.d. of Lopressor.  The patient      will continue to be ruled out by enzymes.  Given her risk factors and      the underlying substernal chest pressure, we will consider stress versus      cardiology consult for cardiac catheterization.  Atypical chest pain is      likely stress and therefore consideration to Paxil or another SSRI for      anxiety management should be considered.  2.  Hypertension:  Continue Diovan.  Lopressor has been added.  3.  GI:  We will start Protonix for peptic ulcer disease as well as GI      prophylaxis.  4.  Gout:  Currently clinically stable.  We will obtain uric acid level.  5.  Degenerative joint disease:  We will continue her current pain      medications.  6.  DVT prophylaxis:  The patient has been placed on IV heparin tentatively      for rule out of her acute coronary syndrome.           ______________________________  Reginia Forts, MD    RA/MEDQ  D:  01/09/2006  T:  01/09/2006  Job:   [id number]

## 2011-02-07 NOTE — Discharge Summary (Signed)
NAME:  GEANNA, DIVIRGILIO               ACCOUNT NO.:  [id number]   MEDICAL RECORD NO.:  [id number]          PATIENT TYPE:  INP   LOCATION:  9320                          FACILITY:  [facility]   PHYSICIAN:  Osborn Coho, M.D.   DATE OF BIRTH:  [birth year]   DATE OF ADMISSION:  07/13/2006  DATE OF DISCHARGE:  07/15/2006                                 DISCHARGE SUMMARY   ADDENDUM:  I think I failed to mention that her date of admission, July 13, 2006; date of discharge, July 15, 2006.      [name].      Osborn Coho, M.D.  Electronically Signed    EJP/MEDQ  D:  07/30/2006  T:  07/31/2006  Job:  [id number]

## 2011-02-07 NOTE — H&P (Signed)
[name], [name]               ACCOUNT NO.:  [id number]   MEDICAL RECORD NO.:  [id number]          PATIENT TYPE:  INP   LOCATION:  6529                         FACILITY:  [facility]   PHYSICIAN:  Corwin Levins, MD      DATE OF BIRTH:  [birth year]   DATE OF ADMISSION:  08/19/2006  DATE OF DISCHARGE:                              HISTORY & PHYSICAL   [name] is Full Code.  [name] was the primary historian.   PRIMARY CARE PHYSICIAN:  Jeannett Senior A. Clent Ridges, MD   TOTAL VISIT TIME:  Approximately 57 minutes.   CHIEF COMPLAINT:  Chest pain.   HISTORY OF PRESENT ILLNESS:  [name] is a 50 year old female with a  history of chest pain, gastroesophageal disease, significant  osteoarthritis who presents with chest pain.  The patient had a  hysterectomy and a bilateral salpingo-oophorectomy for severe  menorrhagia/dysmenorrhea in October of this year.  The patient notes,  approximately four days prior to admission, chest pressure, heaviness,  left sided, with associated palpitations when [name] woke up that morning.  [name] notes it was constant all day.  [name] took some metoprolol because [name]  thinks [name] was having tache palpitations.  This decreased her heart rate  down, and at this time [name] felt it slowed her heart rate for the rest of  the day.  [name] took this about 5 p.m.  The patient notes [name] woke up  three days prior to admission with chest pressure, blood pressure  160/100 when [name] checked it.  The chest pressure was continuous all day.  [name] notes, two days prior to admission, her blood pressure was still  160s/100 despite taking her medications as prescribed.  The patient woke  up on the day prior to admission with chest pressure that persisted  again all day despite taking her medications.  [name] notes checking her  blood pressure this evening it was 200/110 with the chest pressure  becoming more significant and her having associated nausea with it  prompting her to be seen in the emergency room.  The  patient denies any  associated sweats or shortness of breath.  [name] notes significant hot  flashes.  In the emergency room, the patient's blood pressure was  214/104.  [name] was given nitroglycerin drip and sublingual nitroglycerin  and Zofran.   PAST MEDICAL HISTORY:  Past surgical history:  1. Menorrhagia/dysmenorrhea, status post vaginal hysterectomy with      bilateral salpingo-oophorectomy in October 2007.  2. History of hyperlipidemia.  3. History of osteoarthritis.  4. History of hypertension uncontrolled recently.  5. History of gastroesophageal reflux disease.  6. History of insomnia.  7. History of chest pain with admission for rule out acute coronary      syndrome.  [name] did rule out.  [name] had a Myoview stat technician in      1999 and pharmacologic stress with ejection fraction of 64%, no      ischemia.  This was in April 2007.  [name] has a history of multiple      knee repair replacements, 12 in all.   MEDICATIONS:  1. [name] is on Diovan 325 mg p.o. daily.  2. Skelaxin 800 mg t.i.d. p.r.n.  3. Prevacid 30 mg p.o. daily.  4. [name] was on Simvastatin and [name] is not taking it because it causes      muscle aches.  [name] has been off of it since September 2007.  5. [name] is on metoprolol 25 mg p.o. b.i.d.  6. Aspirin 325 mg daily.  [name] has been off of it since her surgery.  7. Percocet 5/3.25 p.r.n.  8. Temazepam 30 mg p.o. q.h.s. p.r.n.  9. Indomethacin 50 mg p.o. t.i.d.  [name] has not recently taken any of      this.  10.Fentanyl 25 mcg transdermal.  Last took this medicine August 16, 2006.   SOCIAL HISTORY:  [name] lives in [place] with her husband, and [name] is  disabled secondary to the knee surgeries and pain.  [name] has three  healthy children, grown.  [name] is a smoker for the last 24 years, half a  pack a day.  No alcohol or illicit drug use.   FAMILY HISTORY:  Positive for the mother with a history of atrial  fibrillation.  Father has a history of myocardial infarction at  the age  of 87, and he died with complications of congestive heart failure.   REVIEW OF SYSTEMS:  Twelve-point review of systems reviewed and negative  unless stated above.  [name] does also note chronic lower extremity edema  which has not worsened.  [name] denies any paroxysmal nocturnal dyspnea.  [name] denies any dyspnea on exertion.  [name] notes some recent hand  puffiness however.  [name] denies any recent rashes.   PHYSICAL EXAMINATION:  VITAL SIGNS:  Blood pressure 214/104, [name] was  afebrile, blood pressure decreased to 162/81 on a nitroglycerin drip.  Chest pain initially 9 out of 10 and then 0 out of 10 and just crept up  again to 5 out of 10.  Saturation 97% on room air.  Respiratory rate of  14.  Pulse 62 to 95.  GENERAL:  [name] is a mildly obese female in no acute distress lying flat  in bed.  HEENT:  Eyes:  Pupils are equally round and reactive to light.  Extraocular movements are intact.  Ears, nose, mouth, and throat  examination:  External inspection of the ears and nose reveals no scars,  lesions, or masses.  Assessment of hearing to whispered voice is full.  Nasal mucosa, septum, and turbinates shows no bogginess, no lesions.  Inspection of the lips, teeth, and gums, oral mucosa, salivary glands,  hard and soft palate, tongue, tonsils, and posterior pharynx reveals no  lesions.  NECK:  Symmetric with no thyromegaly noted, no crepitus.  RESPIRATORY:  The lungs are clear to auscultation bilaterally.  [name] has  no signs of tactile fremitus.  CARDIOVASCULAR:  No displacement of her point of maximum impulse.  Regular rhythm and rate with no murmurs, rubs, or gallops.  Normal S1  and S2, no S3 or S4.  [name] has trace lower extremity edema.  No signs of  abdominal bruits.  GASTROINTESTINAL:  The abdomen is obese, soft, nontender, nondistended  with no hepatosplenomegaly.  Normoactive bowel sounds. LYMPHATIC:  Exam over the neck and axillary regions reveals no  lymphadenopathy.   MUSCULOSKELETAL:  [name] moves all extremities well with no signs of  contractures.  SKIN:  No rashes or ulcers.  NEUROLOGIC:  Her Cranial Nerves II-XII are grossly intact.  Strength and  sensation are grossly intact.  PSYCHIATRIC:  [name] is alert and oriented x4 with normal affect.   EKG done today shows normal sinus rhythm at 82 with normal axis, normal  intervals, there was some ST abnormalities inferior laterally which are  new from EKG done April 2007.   Sodium is 143, potassium 3.3, chloride 109, her BUN is 11, creatinine  1.0, glucose 91.  Her bicarbonate on venous blood gas was 27, pH 7.424,  CO2 41.2.  White count 7.1, platelets 347, segs of 53%, lymphocytes 28%.  D-dimer 0.63 which is slightly elevated.   [name] had a CT angio of the chest to rule out PTE which was negative and  also showed dependent atelectasis.   Chest x-ray showed very mild pulmonary venous congestion, otherwise  negative.   Cardiac markers times three were negative, but [name] did have a mild  troponin elevation at 1903 of 0.07.  These were done at 1903, 2025, and  2149.   ASSESSMENT AND PLAN:  1. This is a female with a history of recent hysterectomy and now      worsening blood pressure.  The worsening blood pressure is likely a      result of her losing both ovaries.  [name] believes that her high      blood pressure is most likely causing subendocardial ischemia and      results in chest pain.  We will rule out acute coronary syndrome      and myocardial infarction.  For this, we will get cardiac markers      times three, start her on aspirin, and consider a cardiology      consult.  We will start her on Lipitor and see how [name] does with      this and check the lipids.  2. For her palpitations, I will check a TSH and also place her on      telemetry.  Will continue beta blocker at a slightly higher dose.  3. For her hypertension and chest pain, will place her on      nitroglycerin drip, and I will give her  one dose of Lasix as well.      Darryl D. Prime, MD   Electronically Signed     ______________________________  Corwin Levins, MD    DDP/MEDQ  D:  08/20/2006  T:  08/20/2006  Job:  [id number]

## 2011-02-07 NOTE — Discharge Summary (Signed)
NAME:  [name], [name]               ACCOUNT NO.:  [id number]   MEDICAL RECORD NO.:  [id number]          PATIENT TYPE:  INP   LOCATION:  5025                         FACILITY:  [facility]   PHYSICIAN:  Lubertha Basque. Dalldorf, M.D.DATE OF BIRTH:  1960/12/31   DATE OF ADMISSION:  06/18/2004  DATE OF DISCHARGE:  06/22/2004                                 DISCHARGE SUMMARY   ADMISSION DIAGNOSES:  1.  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  2.  Status post left total knee replacement.  3.  Hypertension.  4.  Status post numerous knee arthroscopies.   PROCEDURE:  Right total knee revision for a right loose tibial component.   HISTORY OF PRESENT ILLNESS:  The patient is a 50 year old black female,  patient well-known to our practice, who we have done multiple knee  operations on due to arthritis in both of her knees.  Her left knee was  replaced and revised and she has done well.  Her right knee was replaced a  number of months ago, but recent increase in pain and bone scan showed loose  tibial component and we discussed treatment options with her, that being  revision of the right total knee replacement tibial component.  Risks of  anesthesia, risk of infection, blood clot, etc.   LABORATORY DATA:  EKG; normal sinus rhythm.  She did have venous Dopplers  that were done that were normal.   WBC 8.2, hemoglobin 10.8, hematocrit 31.3.  She had serial pro-times done  and she was on low dose Coumadin protocol for DVT prophylaxis.  Sodium 137,  potassium 3.2, glucose 104, BUN 7, creatinine 1.0.  Cultures done at the  time of surgery showed no growth.   HOSPITAL COURSE:  The patient was admitted postoperatively.  She had an IV  of Ringer's lactate 100 mL an hour, 1 gram of Ancef q.8h x3 doses.  Pharmacy  was contacted for Coumadin and for Lovenox protocol for DVT prophylaxis.  Phenergan and Percocet for pain.  She was kept on Diovan HCTZ which were her  medicines for hypertension.  She wore knee-high stockings for DVT  prophylaxis.  Incentive spirometry q.1h.  Knee immobilizer to the right  knee.  Physical therapy was ordered for weightbearing as tolerated and up  and out of bed.  She had pro-times drawn serially and then a CBC and BMET in  the first few days postoperatively.  Day #1 postoperative her blood pressure  was 115/68, temperature 97, hemoglobin 10.8, INR 1.  Her lungs were clear,  abdomen was soft, wound was benign, CPM was on 0 to 40. She had no sign of  infection.  Therapy was ordered to be weightbearing as tolerated.  The  second day postoperative her dressing was changed and wound once again was  noted to be benign, temperature 97, hemoglobin 10.8, INR 1.1.  Abdomen soft,  lungs were clear.  Drains were pulled on this day.  She did have an episode  of some chest discomfort.  EKG was done and was normal.  On  examination, her  chest musculature were tender and we felt that it was from her pulling up on  the overhead bar and frame on her bed to correct herself in position in bed  and to get up and out of bed.  Pharmacy dosed her Coumadin and her Lovenox.  [facility] had been contacted for home physical therapy and home pro-  times and nursing.  She was discharged home.   CONDITION ON DISCHARGE:  She will remain on her home dose of Diovan and  HCTZ, Coumadin 5 mg pills given to her, she is to take 10 mg on October 1  and on October 2 and then home health through [facility] will check  her pro-times and then regulate the dose accordingly.  Physical therapy was  also started on October 3.  Tylox prescription given one or two tablets q.4-  6h p.r.n. pain, Robaxin 500 one q.6h for spasm.  She can be weightbearing as  tolerated, can change her dressing everyday and keep her wound dry.  Any  sign of infection, she is to call our office at [phone] and is to follow up  with [place] in one week at the same phone number  [phone].   DIET:  Unrestricted.       MC/MEDQ  D:  07/11/2004  T:  07/11/2004  Job:  [id number]

## 2011-02-07 NOTE — Op Note (Signed)
[name], [name]               ACCOUNT NO.:  [id number]   MEDICAL RECORD NO.:  [id number]          PATIENT TYPE:  INP   LOCATION:  9320                          FACILITY:  [facility]   PHYSICIAN:  Osborn Coho, M.D.   DATE OF BIRTH:  13-Sep-1961   DATE OF PROCEDURE:  07/13/2006  DATE OF DISCHARGE:  07/15/2006                               OPERATIVE REPORT   PREOPERATIVE DIAGNOSIS:  1. Menorrhagia  2. Dysmenorrhea.  3. Pelvic pain.   POSTOPERATIVE DIAGNOSIS:  1. Menorrhagia  2. Dysmenorrhea.  3. Pelvic pain.   PROCEDURE:  Laparoscopically-assisted vaginal hysterectomy, bilateral  salpingo-oophorectomy and cystoscopy.   ATTENDING:  Osborn Coho, M.D.   ASSISTANT:  Marquis Lunch. Adline Peals.   ANESTHESIA:  General.   SPECIMENS TO PATHOLOGY:  Uterus and cervix weighing 187 grams, bilateral  ovaries and fallopian tubes.   FLUIDS:  3 liters.   URINE OUTPUT:  600 mL   ESTIMATED BLOOD LOSS:  350 mL.   COMPLICATIONS:  None.   PROCEDURE:  The patient was taken to the operating room after risks,  benefits, alternatives reviewed with the patient.  The patient  verbalized understanding and consent signed and witnessed.  The patient  was placed under general anesthesia and prepped and draped in the normal  sterile fashion in the dorsal lithotomy position.  Foley was placed to  gravity and a bivalve speculum was placed in the patient's vagina and  intrauterine manipulator placed without difficulty.  Speculum was  removed.  Attention was then turned to the abdomen where a 10 mm  incision was made at the umbilicus.  The Veress needle was introduced  into the intra-abdominal cavity and pneumoperitoneum achieved.  The 10  mm trocar was advanced into the intra-abdominal cavity and laparoscope  introduced.  A left lower quadrant 5 mm incision was made and the 5 mm  trocar introduced under direct visualization.  The same was done in the  right lower quadrant.  Blunt probe and grasper were  placed for  manipulation and adnexa identified with normal-appearing bilateral  ovaries and fallopian tubes.  The round ligament was grasped and with  the tripolar, was then cauterized and cut on the left side.  The same  was done on the right side.  Hydrodissection was performed on the right  and bladder flap created with the Endoshears.  There is some difficulty  noting the ureter on the right secondary to the hydrodissection and  decision was made to leave the ovary and fallopian tube at that time.  The tripolar was then used to cauterize and cut the utero-ovarian  ligament down to the level of the round ligament on the right,  incorporating the parametrial tissue.  The left ureter was identified  and noted to peristalsis without difficulty.  The left infundibulopelvic  ligament was then cauterized with the tripolar and two Endoloops were  placed using 0 Vicryl Endoloops.  This was done after excising the utero-  ovarian pedicle with the tripolar via cautery and cut.  The left ovary  and fallopian tube was then excised and placed in the posterior  cul-de-  sac.  The remainder of the parametrial tissue on the left was cauterized  and cut to the level of the left round ligament and the bladder flap was  created with hydrodissection.  With the abdomen sterilely draped, the  pneumoperitoneum was relieved and attention was turned to the vagina.  The intrauterine manipulator was removed and weighted speculum placed.  The vaginal wall retractors were placed as well to identify the cervix  without difficulty.  The cervix was circumscribed with the Bovie and the  posterior cul-de-sac entered without difficulty and left ovary and  fallopian tube removed and sent to pathology.  The anterior cul-de-sac  was entered as well without difficulty.  The  bilateral uterosacral  ligaments were then clamped with a curved Heaney, cut and suture ligated  using 0 Vicryl.  The remainder of the paracervical and  parametrial  tissue was clamped and cut using the curved Heaney and suture ligated  using 0 Vicryl incorporating the cardinal ligament and remainder of the  parametrial tissue.  Bilateral pedicles were remaining and the fundus  was exteriorized and the bilateral remaining pedicles were clamped with  the curved Heaney, cut and tied with a 0 Vicryl free tie and suture  ligated using 0 Vicryl.  The uterus and cervix were sent off to  pathology as well.  A McCall culdoplasty stitch was placed using 0  Vicryl and the vaginal cuff was repaired with 0 Vicryl via figure-of-  eight stitches.  The angles were closed using 0 Vicryl via a fixation  stitch incorporating the uterosacral ligament.  Gloves were changed and  attention was turned to the abdomen where the right ureter was noted to  peristalsis.  However, it was sluggish.  The right infundibulopelvic  ligament was tied with two 0 Vicryl Endoloops and the right fallopian  tube and ovary were excised, placed in an Endopouch, and removed via the  umbilical incision.  This was done under direct visualization with a 5  mm laparoscope placed into the left lower quadrant port.  The 10 mm  laparoscope was then used once again and there was some oozing noted at  the vaginal cuff and two pieces of Surgicel was placed for good  hemostasis.  There was no active bleeding and the pneumoperitoneum was  relieved and trocars were removed under direct visualization.  The right  and left lower quadrant incisions were repaired with Dermabond and the  10 mm laparoscope was removed under direct  visualization along with the  trocar.  The fascia was repaired at the umbilicus using 0 Vicryl via a  running stitch and the skin was closed using a subcutaneous stitch of 3-  0 Monocryl.  Cystoscopy was performed and bilateral ureters were noted  to efflux without difficulty.  Sponge lap and needle count was correct. The patient tolerated the procedure well and was  returned to the  recovery room in good condition.      Osborn Coho, M.D.  Electronically Signed     AR/MEDQ  D:  07/23/2006  T:  07/23/2006  Job:  [id number]

## 2011-02-07 NOTE — Discharge Summary (Signed)
[facility]  Patient:    [name], [name] Visit Number: [id number] MRN: [id number]          Service Type: SUR Location: [id number] Attending Physician:  Marcene Corning Dictated by:   Prince Rome, P.A. Admit Date:  04/20/2001 Discharge Date: 04/23/2001                             Discharge Summary  ADMISSION DIAGNOSES: 1. 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 2. Hypertension.  DISCHARGE DIAGNOSES: 1. 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 2. Hypertension.  OPERATIONS:  Unicondylar left knee medial compartment replacement.  HISTORY OF PRESENT ILLNESS:  [age]-year-old black female patient, well known to [place].  We have arthroscoped her left knee x 2.  She has end-stage bone-on-bone arthritis on the medial compartment only.  The rest of the knee is healthy.  We have injected her with corticosteroids, given her nonsteroidal anti-inflammatory drugs, and she has medial compartment pain with every step and some night pain.  We discussed treatment options with her and decided to try unicondylar knee replacement medial compartment.  LABORATORY DATA:  Chest x-ray:  No active disease.  Normal.  Hemoglobin 12.3, hematocrit 35.5.  Other indices are normal.  HOSPITAL COURSE:  The patient was admitted postoperatively and placed on a variety of p.o. and IM analgesics for pain.  Physical therapy was ordered for weightbearing as tolerated with crutches or walker.  The dressing was changed in the hospital, and her wound was benign.  She was eventually on p.o. medications and was discharged home.  CONDITION ON DISCHARGE:  Improved.  DISCHARGE INSTRUCTIONS:  [facility] will follow her up for therapy. She will remain on one aspirin a day for anticoagulation protocol.  Pain medicine was given.  We will see her back in the office in a week.  She can be weightbearing as tolerated with  crutches or walker. Dictated by:   Prince Rome, P.A. Attending Physician:  Marcene Corning DD:  05/23/01 TD:  05/23/01 Job: [id number]

## 2011-02-07 NOTE — H&P (Signed)
[name], [name]               ACCOUNT NO.:  [id number]   MEDICAL RECORD NO.:  [id number]          PATIENT TYPE:  INP   LOCATION:  3704                         FACILITY:  [facility]   PHYSICIAN:  Pricilla Riffle, MD, FACCDATE OF BIRTH:  [birth year]   DATE OF ADMISSION:  08/19/2006  DATE OF DISCHARGE:  08/25/2006                              HISTORY & PHYSICAL   HISTORY OF PRESENT ILLNESS:  The patient is a 50 year old with chest  pain.  She has no known history of coronary artery disease.  She was  admitted in April 2007 and ruled out for myocardial infarction at that  time.   She had no advance of tachy palpitations followed by chest heaviness on  Sunday 9/10 in intensity.  It started with moderate exertion.  Denies  shortness of breath, positive nausea.  Positive diaphoresis.   The patient took metoprolol with some relief Monday and Tuesday.  She  had exertional chest pressure.  Blood pressure at the time was 160/100.  Chest pressure resolved spontaneously.  On Wednesday, blood pressure  210/119 with chest pressure.  She called her primary M.D. and told to  come to the emergency room.   Since admission, the blood pressure is under better control and  decreased activity.  Today, 1 episode of chest pain and with 1  nitroglycerin resolved.   ALLERGIES:  1. CODEINE.  2. DILAUDID.   MEDICATIONS PRIOR TO ADMISSION:  1. Diovan 320.  2. Indocin 50 t.i.d.  3. Skelaxin 800 t.i.d.  4. Restoril 30.  5. Prevacid.  6. Metoprolol 25 b.i.d.  7. Percocet.  8. Fentanyl patch.   MEDICATIONS NOW:  1. Heparin every 8.  2. Lasix 40 intravenously x1.  3. Aspirin 325.  4. Protonix 40 b.i.d.  5. No Indocin.  6. Metoprolol increased to 50 b.i.d.   PAST MEDICAL HISTORY:  1. Hypertension.  2. DJD.  3. Peptic ulcer disease/GU reflux.  4. Obesity.  5. Dyslipidemia.   SOCIAL HISTORY:  The patient lives in [place], is married, and on  disability.  Smokes 12-pack-year.  Does not  drink.   FAMILY HISTORY:  Mother is alive at age 21, paroxysmal atrial  fibrillation.  Father died of CHF, had a history of coronary disease in  his 17's.  Positive CAD in 4/7 brothers.   REVIEW OF SYSTEMS:  All systems reviewed.  Chronic lower extremity  edema.  Palpitations as noted.  No syncope.  Otherwise, all systems  negative to the above problem except as noted.   PHYSICAL EXAMINATION:  GENERAL:  The patient was in no distress.  VITAL SIGNS:  Blood pressure 158/69, pulse 58 and regular, temperature  is 98, O2 sat on room air 98%.  HEENT:  Normocephalic, atraumatic.  PERRLA.  EOMI.  Sclerae clear.  Throat clear.  Nares clear.  NECK:  JVP is normal.  No bruits.  CARDIAC:  Regular rate and rhythm.  S1 S2, no S3, no significant  murmurs.  LUNGS:  Clear to auscultation.  ABDOMEN:  Slight tenderness.  No hepatomegaly, no masses.  EXTREMITIES:  No edema.  2+ pulses.  NEURO:  Alert and oriented x3.  Cranial nerves II-XII intact.  Motor:  Moves all extremities.   LABORATORY DATA:  Chest x-ray:  No acute disease.  Chest CT:  No PE.  Question an atelectasis.   EKG:  Normal sinus rhythm.  Slight STD, nonspecific ST-T wave changes.   Labs:  Significant for hemoglobin of 12.5, WBC of 7.1, potassium of 3.5.  BUN and creatinine 13 and 0.8.  CK-MB negative x2.  Troponin negative  x2.  Cholesterol 202, HDL 23, LDL 124.   IMPRESSION:  The patient is a 50 year old with a history of  hypertension, now uncontrolled, associated chest pain, question if  related to blood pressure versus ischemic.  Note:  Myoview was done in  April 2007 and was negative.  EKG with slight ST depression on November  28 with chest pain, otherwise no significant change.  Again, with  increased blood pressure all was concern for renal artery stenosis.  Note:  Patient has metabolic syndrome profile.   RECOMMENDATIONS:  1. Increase blood pressure medicines.  Add Norvasc 2.5 tonight then 5      in the morning.  DC  nitroglycerine.  2. Increase Zocor to 40.   I have discussed with the patient possible stress test versus cath.  Will watch response the next day or two before scheduling definitive  study.      Pricilla Riffle, MD, [facility]  Electronically Signed     PVR/MEDQ  D:  09/03/2006  T:  09/03/2006  Job:  [id number]

## 2011-02-07 NOTE — Op Note (Signed)
   NAME:  [name], [name]                         ACCOUNT NO.:  [id number]   MEDICAL RECORD NO.:  [id number]                   PATIENT TYPE:  AMB   LOCATION:  DSC                                  FACILITY:  [facility]   PHYSICIAN:  Lubertha Basque. Jerl Santos, M.D.             DATE OF BIRTH:  1961/06/30   DATE OF PROCEDURE:  12/27/2002  DATE OF DISCHARGE:                                 OPERATIVE REPORT   PREOPERATIVE DIAGNOSIS:  Left knee stiffness.   POSTOPERATIVE DIAGNOSIS:  Left knee stiffness.   PROCEDURE:  Left knee closed manipulation.   ANESTHESIA:  General mask.   SURGEON:  Lubertha Basque. Jerl Santos, M.D.   ASSISTANT:  Lindwood Qua, P.A.   INDICATIONS FOR PROCEDURE:  The patient is a 50 year old woman with a long  complicated history of left knee difficulty.  She is most recently status  post revision arthroplasty of the left knee 1-1/2 months ago.  She has  persisted with stiffness despite ardent attempts at physical therapy.  She  is offered a manipulation at this point.  An informed operative consent was  obtained after a discussion of the possible complications of, reaction to  anesthesia, and fracture.   DESCRIPTION OF PROCEDURE:  The patient is taken to the operating suite where  general anesthetic was applied without difficulty.  She was positioned  supine, and then prepped and draped in the normal sterile fashion.  After  the administration of the anesthetic, the knee was manipulated gently.  Even  a ___________ motion was about 10-60 degrees.  With some steady pressure I  increased her motion from about 5-90 degrees.  After a sterile prep with  Betadine and alcohol, we injected the knee with some Marcaine at the end of  the case.  A Band-Aid was applied.   DISPOSITION:  The patient was taken to the recovery room in stable  condition.  Plans were for her to go home the same day and follow up in the  office in less than one week.  I will contact her by phone today.                                            Lubertha Basque Jerl Santos, M.D.    PGD/MEDQ  D:  12/27/2002  T:  12/28/2002  Job:  [id number]

## 2011-02-07 NOTE — Discharge Summary (Signed)
   NAME:  [name], [name]                         ACCOUNT NO.:  [id number]   MEDICAL RECORD NO.:  [id number]                   PATIENT TYPE:  INP   LOCATION:  5038                                 FACILITY:  [facility]   PHYSICIAN:  Lubertha Basque. Jerl Santos, M.D.             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   PROCEDURE:  Revision arthroplasty of left knee and left tibial component.   HISTORY OF PRESENT ILLNESS:  The patient is a 50 year old, African-American  female patient well-known to our practice who some months ago had undergone  total knee replacement on the left side with continued pain and decreased  motion and problems with pain in her tibia.  We had done a bone scan which  showed the suggestion of a tibial component.  We discussed treatment options  with her with that being revision of the tibial component.   LABORATORY DATA AND X-RAY FINDINGS:  Hemoglobin 8.9.  She was on low-dose  Coumadin protocol so she was on serial protimes.   HOSPITAL COURSE:  The patient was admitted postoperatively and given p.o.  analgesics for pain.  Physical therapy was ordered for range of motion.  She  was also put on CPM.  She was given IV Ancef q.8h. x3 doses and  Lovenox/Coumadin low-dose protocol for acute prophylaxis.  Her dressing was  changed and wounds were benign throughout her hospital stay and she was  discharged home.   CONDITION ON DISCHARGE:  Improved.   FOLLOW UP:  She will return to our office in about two weeks from  postoperative date.   DISCHARGE MEDICATIONS:  1. Coumadin x30 days from the postoperative date.  2. Prescription for pain medicine was given.   SPECIAL INSTRUCTIONS:  Arrangements for  home therapy and protimes to be  drawn.     Lindwood Qua, P.A.                    Lubertha Basque Jerl Santos, M.D.    MC/MEDQ  D:  01/19/2003  T:  01/19/2003  Job:  [id number]

## 2011-02-26 ENCOUNTER — Ambulatory Visit: Payer: 59 | Attending: Orthopedic Surgery | Admitting: Physical Therapy

## 2011-02-26 DIAGNOSIS — M25569 Pain in unspecified knee: Secondary | ICD-10-CM | POA: Insufficient documentation

## 2011-02-26 DIAGNOSIS — Z96659 Presence of unspecified artificial knee joint: Secondary | ICD-10-CM | POA: Insufficient documentation

## 2011-02-26 DIAGNOSIS — R5381 Other malaise: Secondary | ICD-10-CM | POA: Insufficient documentation

## 2011-02-26 DIAGNOSIS — IMO0001 Reserved for inherently not codable concepts without codable children: Secondary | ICD-10-CM | POA: Insufficient documentation

## 2011-02-26 DIAGNOSIS — R262 Difficulty in walking, not elsewhere classified: Secondary | ICD-10-CM | POA: Insufficient documentation

## 2011-02-26 DIAGNOSIS — M25669 Stiffness of unspecified knee, not elsewhere classified: Secondary | ICD-10-CM | POA: Insufficient documentation

## 2011-03-05 ENCOUNTER — Encounter: Payer: 59 | Admitting: Physical Therapy

## 2011-03-11 ENCOUNTER — Ambulatory Visit: Payer: 59 | Admitting: Rehabilitative and Restorative Service Providers"

## 2011-03-13 ENCOUNTER — Ambulatory Visit: Payer: 59 | Admitting: Physical Therapy

## 2011-03-17 ENCOUNTER — Ambulatory Visit: Payer: 59 | Admitting: Physical Therapy

## 2011-03-19 ENCOUNTER — Ambulatory Visit: Payer: 59 | Admitting: Physical Therapy

## 2011-03-24 ENCOUNTER — Ambulatory Visit: Payer: 59 | Admitting: Rehabilitative and Restorative Service Providers"

## 2011-03-27 ENCOUNTER — Encounter: Payer: 59 | Admitting: Rehabilitative and Restorative Service Providers"

## 2011-04-04 ENCOUNTER — Encounter: Payer: 59 | Admitting: Rehabilitative and Restorative Service Providers"

## 2011-04-10 ENCOUNTER — Ambulatory Visit: Payer: 59 | Attending: Orthopedic Surgery | Admitting: Rehabilitative and Restorative Service Providers"

## 2011-04-10 DIAGNOSIS — R262 Difficulty in walking, not elsewhere classified: Secondary | ICD-10-CM | POA: Insufficient documentation

## 2011-04-10 DIAGNOSIS — IMO0001 Reserved for inherently not codable concepts without codable children: Secondary | ICD-10-CM | POA: Insufficient documentation

## 2011-04-10 DIAGNOSIS — M25669 Stiffness of unspecified knee, not elsewhere classified: Secondary | ICD-10-CM | POA: Insufficient documentation

## 2011-04-10 DIAGNOSIS — R5381 Other malaise: Secondary | ICD-10-CM | POA: Insufficient documentation

## 2011-04-10 DIAGNOSIS — M25569 Pain in unspecified knee: Secondary | ICD-10-CM | POA: Insufficient documentation

## 2011-04-10 DIAGNOSIS — Z96659 Presence of unspecified artificial knee joint: Secondary | ICD-10-CM | POA: Insufficient documentation

## 2011-04-16 ENCOUNTER — Encounter: Payer: 59 | Admitting: Rehabilitative and Restorative Service Providers"

## 2011-04-18 ENCOUNTER — Ambulatory Visit: Payer: 59 | Admitting: Rehabilitative and Restorative Service Providers"

## 2011-04-21 ENCOUNTER — Ambulatory Visit: Payer: 59

## 2011-04-21 ENCOUNTER — Encounter: Payer: 59 | Admitting: Rehabilitative and Restorative Service Providers"

## 2011-04-23 ENCOUNTER — Ambulatory Visit: Payer: 59 | Admitting: Physical Therapy

## 2011-04-24 ENCOUNTER — Ambulatory Visit: Payer: 59 | Attending: Orthopedic Surgery | Admitting: Physical Therapy

## 2011-04-24 DIAGNOSIS — M25569 Pain in unspecified knee: Secondary | ICD-10-CM | POA: Insufficient documentation

## 2011-04-24 DIAGNOSIS — R262 Difficulty in walking, not elsewhere classified: Secondary | ICD-10-CM | POA: Insufficient documentation

## 2011-04-24 DIAGNOSIS — Z96659 Presence of unspecified artificial knee joint: Secondary | ICD-10-CM | POA: Insufficient documentation

## 2011-04-24 DIAGNOSIS — IMO0001 Reserved for inherently not codable concepts without codable children: Secondary | ICD-10-CM | POA: Insufficient documentation

## 2011-04-24 DIAGNOSIS — M25669 Stiffness of unspecified knee, not elsewhere classified: Secondary | ICD-10-CM | POA: Insufficient documentation

## 2011-04-24 DIAGNOSIS — R5381 Other malaise: Secondary | ICD-10-CM | POA: Insufficient documentation

## 2011-04-29 ENCOUNTER — Ambulatory Visit: Payer: 59 | Admitting: Rehabilitative and Restorative Service Providers"

## 2011-05-01 ENCOUNTER — Ambulatory Visit: Payer: 59 | Admitting: Rehabilitative and Restorative Service Providers"

## 2011-05-06 ENCOUNTER — Encounter: Payer: 59 | Admitting: Rehabilitative and Restorative Service Providers"

## 2011-05-08 ENCOUNTER — Emergency Department (HOSPITAL_COMMUNITY)
Admission: EM | Admit: 2011-05-08 | Discharge: 2011-05-08 | Disposition: A | Discharge status: Home / Self Care | Payer: 59 | Attending: Emergency Medicine | Admitting: Emergency Medicine

## 2011-05-08 ENCOUNTER — Encounter: Payer: 59 | Admitting: Rehabilitative and Restorative Service Providers"

## 2011-05-08 DIAGNOSIS — Z86718 Personal history of other venous thrombosis and embolism: Secondary | ICD-10-CM | POA: Insufficient documentation

## 2011-05-08 DIAGNOSIS — G8929 Other chronic pain: Secondary | ICD-10-CM | POA: Insufficient documentation

## 2011-05-08 DIAGNOSIS — Z79899 Other long term (current) drug therapy: Secondary | ICD-10-CM | POA: Insufficient documentation

## 2011-05-08 DIAGNOSIS — M5380 Other specified dorsopathies, site unspecified: Secondary | ICD-10-CM | POA: Insufficient documentation

## 2011-05-08 DIAGNOSIS — I10 Essential (primary) hypertension: Secondary | ICD-10-CM | POA: Insufficient documentation

## 2011-05-08 DIAGNOSIS — M549 Dorsalgia, unspecified: Secondary | ICD-10-CM | POA: Insufficient documentation

## 2011-05-12 ENCOUNTER — Ambulatory Visit: Payer: 59 | Admitting: Family Medicine

## 2011-05-13 ENCOUNTER — Ambulatory Visit: Payer: 59 | Admitting: Rehabilitative and Restorative Service Providers"

## 2011-06-17 LAB — CBC
HCT: 38.7
Hemoglobin: 13.4
MCHC: 34.6
MCV: 89.2
Platelets: 303
RBC: 4.34
RDW: 13.6
WBC: 8.8

## 2011-06-17 LAB — DIFFERENTIAL
Basophils Absolute: 0.1
Basophils Relative: 1
Eosinophils Absolute: 0.2
Eosinophils Relative: 2
Lymphocytes Relative: 30
Lymphs Abs: 2.7
Monocytes Absolute: 0.5
Monocytes Relative: 5
Neutro Abs: 5.4
Neutrophils Relative %: 62

## 2011-06-17 LAB — POCT I-STAT, CHEM 8
BUN: 13
Calcium, Ion: 1.09 — ABNORMAL LOW
Chloride: 106
Creatinine, Ser: 0.9
Glucose, Bld: 93
HCT: 41
Hemoglobin: 13.9
Potassium: 3.2 — ABNORMAL LOW
Sodium: 142
TCO2: 27

## 2011-06-17 LAB — URINALYSIS, ROUTINE W REFLEX MICROSCOPIC
Bilirubin Urine: NEGATIVE
Glucose, UA: NEGATIVE
Ketones, ur: NEGATIVE
Nitrite: NEGATIVE
Protein, ur: NEGATIVE
Specific Gravity, Urine: 1.016
Urobilinogen, UA: 1
pH: 7

## 2011-06-17 LAB — URINE MICROSCOPIC-ADD ON

## 2011-06-24 LAB — POCT I-STAT, CHEM 8
BUN: 9
Calcium, Ion: 1 — ABNORMAL LOW
Chloride: 107
Creatinine, Ser: 1.1
Glucose, Bld: 87
HCT: 39
Hemoglobin: 13.3
Potassium: 3.2 — ABNORMAL LOW
Sodium: 139
TCO2: 25

## 2011-06-24 LAB — CBC
HCT: 39.6
Hemoglobin: 13.1
MCHC: 32.9
MCV: 88.9
Platelets: 342
RBC: 4.46
RDW: 14.5
WBC: 9.2

## 2011-06-24 LAB — DIFFERENTIAL
Basophils Absolute: 0
Basophils Relative: 0
Eosinophils Absolute: 0.1
Eosinophils Relative: 1
Lymphocytes Relative: 37
Lymphs Abs: 3.4
Monocytes Absolute: 0.4
Monocytes Relative: 4
Neutro Abs: 5.3
Neutrophils Relative %: 58

## 2011-06-24 LAB — D-DIMER, QUANTITATIVE (NOT AT ARMC): D-Dimer, Quant: 0.46

## 2011-06-26 LAB — POCT CARDIAC MARKERS
CKMB, poc: 1.5 ng/mL (ref 1.0–8.0)
Myoglobin, poc: 90.8 ng/mL (ref 12–200)
Troponin i, poc: <0.05 ng/mL (ref 0.00–0.09)

## 2011-06-30 LAB — POCT CARDIAC MARKERS
CKMB, poc: 1.7
Myoglobin, poc: 74.8
Operator id: 257131
Troponin i, poc: <0.05

## 2011-06-30 LAB — I-STAT 8, (EC8 V) (CONVERTED LAB)
Acid-base deficit: 1
BUN: 11
Bicarbonate: 23.6
Chloride: 107
Glucose, Bld: 90
HCT: 43
Hemoglobin: 14.6
Operator id: 257131
Potassium: 2.9 — ABNORMAL LOW
Sodium: 141
TCO2: 25
pCO2, Ven: 39.1 — ABNORMAL LOW
pH, Ven: 7.389 — ABNORMAL HIGH

## 2011-06-30 LAB — POCT I-STAT CREATININE
Creatinine, Ser: 1
Operator id: 257131

## 2011-07-02 ENCOUNTER — Ambulatory Visit (INDEPENDENT_AMBULATORY_CARE_PROVIDER_SITE_OTHER): Payer: 59 | Admitting: Family Medicine

## 2011-07-02 ENCOUNTER — Encounter: Payer: Self-pay | Admitting: Family Medicine

## 2011-07-02 DIAGNOSIS — N390 Urinary tract infection, site not specified: Secondary | ICD-10-CM

## 2011-07-02 DIAGNOSIS — R109 Unspecified abdominal pain: Secondary | ICD-10-CM

## 2011-07-02 LAB — POCT URINALYSIS DIPSTICK
Bilirubin, UA: NEGATIVE
Glucose, UA: NEGATIVE
Ketones, UA: NEGATIVE
Nitrite, UA: NEGATIVE
Spec Grav, UA: 1.01
Urobilinogen, UA: 0.2
pH, UA: 6.5

## 2011-07-02 MED ORDER — CEPHALEXIN 500 MG PO TABS: 500.0000 mg | ORAL_TABLET | Freq: Three times a day (TID) | ORAL | Status: AC

## 2011-07-02 NOTE — Progress Notes (Signed)
  Subjective:    Patient ID: [name], female    DOB: 20-Sep-1961, 50 y.o.   MRN: [id number]  HPI One-day history of dysuria. Decreased urination and mild burning. Also some low back pain. Nausea but no vomiting. Denies fever or chills. Yesterday had slight blood with wiping. No diarrhea. Previous hysterectomy. Allergy to sulfa   Review of Systems  Constitutional: Negative for fever, chills and appetite change.  Gastrointestinal: Negative for nausea, vomiting, abdominal pain, diarrhea and constipation.  Genitourinary: Positive for dysuria, urgency and frequency.  Musculoskeletal: Negative for back pain.  Neurological: Negative for dizziness.       Objective:   Physical Exam  Constitutional: She appears well-developed and well-nourished.  HENT:  Head: Normocephalic and atraumatic.  Neck: Normal range of motion. Neck supple.  Cardiovascular: Normal rate, regular rhythm and normal heart sounds.   No murmur heard. Pulmonary/Chest: Breath sounds normal. No respiratory distress. She has no wheezes. She has no rales.  Abdominal: Soft. Bowel sounds are normal. She exhibits no distension. There is no tenderness. There is no rebound and no guarding.  Musculoskeletal: Normal range of motion. She exhibits no edema.  Psychiatric: She has a normal mood and affect. Her behavior is normal.          Assessment & Plan:  UTI. Urine culture sent. Cephalexin 500 mg 3 times a day for 7 days. Avoid Cipro with possible prolonged QT interval with methadone

## 2011-07-02 NOTE — Patient Instructions (Signed)
Urinary Tract Infection (UTI) Infections of the urinary tract can start in several places. A bladder infection (cystitis), a kidney infection (pyelonephritis), and a prostate infection (prostatitis) are different types of urinary tract infections. They usually get better if treated with medicines (antibiotics) that kill germs. Take all the medicine until it is gone. You or your child may feel better in a few days, but TAKE ALL MEDICINE or the infection may not respond and may become more difficult to treat. HOME CARE INSTRUCTIONS  Drink enough water and fluids to keep the urine clear or pale yellow. Cranberry juice is especially recommended, in addition to large amounts of water.   Avoid caffeine, tea, and carbonated beverages. They tend to irritate the bladder.   Alcohol may irritate the prostate.   Only take over-the-counter or prescription medicines for pain, discomfort, or fever as directed by your caregiver.  FINDING OUT THE RESULTS OF YOUR TEST Not all test results are available during your visit. If your or your child's test results are not back during the visit, make an appointment with your caregiver to find out the results. Do not assume everything is normal if you have not heard from your caregiver or the medical facility. It is important for you to follow up on all test results. TO PREVENT FURTHER INFECTIONS:  Empty the bladder often. Avoid holding urine for long periods of time.   After a bowel movement, women should cleanse from front to back. Use each tissue only once.   Empty the bladder before and after sexual intercourse.  SEEK MEDICAL CARE IF:  There is back pain.   You or your child has an oral temperature above 102.   Your baby is older than 3 months with a rectal temperature of 100.5 F (38.1 C) or higher for more than 1 day.   Your or your child's problems (symptoms) are no better in 3 days. Return sooner if you or your child is getting worse.  SEEK IMMEDIATE  MEDICAL CARE IF:  There is severe back pain or lower abdominal pain.   You or your child develops chills.   You or your child has an oral temperature above 102, not controlled by medicine.   Your baby is older than 3 months with a rectal temperature of 102 F (38.9 C) or higher.   Your baby is 3 months old or younger with a rectal temperature of 100.4 F (38 C) or higher.   There is nausea or vomiting.   There is continued burning or discomfort with urination.  MAKE SURE YOU:  Understand these instructions.   Will watch this condition.   Will get help right away if you or your child is not doing well or gets worse.  Document Released: 06/18/2005 Document Re-Released: 12/03/2009 [employer] Patient Information 2011 [employer]. 

## 2011-07-07 LAB — I-STAT 8, (EC8 V) (CONVERTED LAB)
Acid-Base Excess: 3 — ABNORMAL HIGH
BUN: 15
Bicarbonate: 29.1 — ABNORMAL HIGH
Chloride: 105
Glucose, Bld: 84
HCT: 41
Hemoglobin: 13.9
Operator id: 151321
Potassium: 3.1 — ABNORMAL LOW
Sodium: 143
TCO2: 31
pCO2, Ven: 51.2 — ABNORMAL HIGH
pH, Ven: 7.363 — ABNORMAL HIGH

## 2011-07-07 LAB — CBC
HCT: 37.4
Hemoglobin: 12.8
MCHC: 34.2
MCV: 88.3
Platelets: 367
RBC: 4.23
RDW: 14.4 — ABNORMAL HIGH
WBC: 10

## 2011-07-07 LAB — DIFFERENTIAL
Basophils Absolute: 0
Basophils Relative: 0
Eosinophils Absolute: 0.1
Eosinophils Relative: 1
Lymphocytes Relative: 32
Lymphs Abs: 3.2
Monocytes Absolute: 0.6
Monocytes Relative: 6
Neutro Abs: 6.1
Neutrophils Relative %: 61

## 2011-07-07 LAB — POCT I-STAT CREATININE
Creatinine, Ser: 1
Operator id: 151321

## 2011-07-07 LAB — D-DIMER, QUANTITATIVE (NOT AT ARMC): D-Dimer, Quant: 0.48

## 2011-08-15 ENCOUNTER — Ambulatory Visit (HOSPITAL_COMMUNITY)
Admission: RE | Admit: 2011-08-15 | Discharge: 2011-08-15 | Disposition: A | Discharge status: Home / Self Care | Payer: 59 | Source: Ambulatory Visit | Attending: Anesthesiology | Admitting: Anesthesiology

## 2011-08-15 ENCOUNTER — Other Ambulatory Visit (HOSPITAL_COMMUNITY): Payer: Self-pay | Admitting: Anesthesiology

## 2011-08-15 DIAGNOSIS — R52 Pain, unspecified: Secondary | ICD-10-CM

## 2011-08-15 DIAGNOSIS — M25519 Pain in unspecified shoulder: Secondary | ICD-10-CM | POA: Insufficient documentation

## 2011-08-26 ENCOUNTER — Ambulatory Visit (INDEPENDENT_AMBULATORY_CARE_PROVIDER_SITE_OTHER): Payer: 59 | Admitting: Family Medicine

## 2011-08-26 ENCOUNTER — Encounter: Payer: Self-pay | Admitting: Family Medicine

## 2011-08-26 VITALS — BP 154/88 | HR 74 | Temp 98.8°F | Wt 257.0 lb

## 2011-08-26 DIAGNOSIS — N3090 Cystitis, unspecified without hematuria: Secondary | ICD-10-CM

## 2011-08-26 DIAGNOSIS — N390 Urinary tract infection, site not specified: Secondary | ICD-10-CM

## 2011-08-26 LAB — POCT URINALYSIS DIPSTICK
Bilirubin, UA: NEGATIVE
Glucose, UA: NEGATIVE
Ketones, UA: NEGATIVE
Nitrite, UA: POSITIVE
Spec Grav, UA: 1.015
Urobilinogen, UA: 1
pH, UA: 6.5

## 2011-08-26 MED ORDER — CIPROFLOXACIN HCL 500 MG PO TABS: 500.0000 mg | ORAL_TABLET | Freq: Two times a day (BID) | ORAL | Status: AC

## 2011-08-26 NOTE — Progress Notes (Signed)
  Subjective:    Patient ID: [name], female    DOB: 11-09-1960, 50 y.o.   MRN: [id number]  HPI Here for 3 days of sharp pains in the right mid back and right flank, also some increased urgency to urinate. No burning. No fever but she has been nauseated. She was treated here 2 months ago for a UTI with Keflex. A culture was ordered but not done. She seemed to get better for awhile after that.    Review of Systems  Constitutional: Negative.   Gastrointestinal: Negative.   Genitourinary: Positive for urgency, frequency and flank pain. Negative for dysuria.       Objective:   Physical Exam  Constitutional: She appears well-developed and well-nourished.  Abdominal: Soft. Bowel sounds are normal. She exhibits no mass. There is no rebound and no guarding.       Tender in the right flank          Assessment & Plan:  Drink plenty of water. Await culture results

## 2011-08-30 LAB — URINE CULTURE: Colony Count: >=100000

## 2011-09-02 NOTE — Progress Notes (Signed)
Quick Note:  Spoke with pt ______ 

## 2011-09-17 ENCOUNTER — Telehealth: Payer: Self-pay | Admitting: Family Medicine

## 2011-09-17 ENCOUNTER — Ambulatory Visit (INDEPENDENT_AMBULATORY_CARE_PROVIDER_SITE_OTHER): Payer: 59 | Admitting: Internal Medicine

## 2011-09-17 ENCOUNTER — Encounter: Payer: Self-pay | Admitting: Internal Medicine

## 2011-09-17 VITALS — BP 130/82 | HR 92 | Temp 99.5°F | Wt 242.0 lb

## 2011-09-17 DIAGNOSIS — R109 Unspecified abdominal pain: Secondary | ICD-10-CM | POA: Insufficient documentation

## 2011-09-17 DIAGNOSIS — E876 Hypokalemia: Secondary | ICD-10-CM | POA: Insufficient documentation

## 2011-09-17 DIAGNOSIS — R509 Fever, unspecified: Secondary | ICD-10-CM

## 2011-09-17 LAB — CBC WITH DIFFERENTIAL/PLATELET
Basophils Absolute: 0 10*3/uL (ref 0.0–0.1)
Basophils Relative: 0.3 % (ref 0.0–3.0)
Eosinophils Absolute: 0 10*3/uL (ref 0.0–0.7)
Eosinophils Relative: 0.3 % (ref 0.0–5.0)
HCT: 39.7 % (ref 36.0–46.0)
Hemoglobin: 13.2 g/dL (ref 12.0–15.0)
Lymphocytes Relative: 15.9 % (ref 12.0–46.0)
Lymphs Abs: 1 10*3/uL (ref 0.7–4.0)
MCHC: 33.4 g/dL (ref 30.0–36.0)
MCV: 87.6 fl (ref 78.0–100.0)
Monocytes Absolute: 0.5 10*3/uL (ref 0.1–1.0)
Monocytes Relative: 8.2 % (ref 3.0–12.0)
Neutro Abs: 4.6 10*3/uL (ref 1.4–7.7)
Neutrophils Relative %: 75.3 % (ref 43.0–77.0)
Platelets: 224 10*3/uL (ref 150.0–400.0)
RBC: 4.52 Mil/uL (ref 3.87–5.11)
RDW: 14.1 % (ref 11.5–14.6)
WBC: 6.2 10*3/uL (ref 4.5–10.5)

## 2011-09-17 LAB — HEPATIC FUNCTION PANEL
ALT: 11 U/L (ref 0–35)
AST: 22 U/L (ref 0–37)
Albumin: 4.4 g/dL (ref 3.5–5.2)
Alkaline Phosphatase: 90 U/L (ref 39–117)
Bilirubin, Direct: 0 mg/dL (ref 0.0–0.3)
Total Bilirubin: 0.4 mg/dL (ref 0.3–1.2)
Total Protein: 7.6 g/dL (ref 6.0–8.3)

## 2011-09-17 LAB — BASIC METABOLIC PANEL
BUN: 9 mg/dL (ref 6–23)
CO2: 29 mEq/L (ref 19–32)
Calcium: 9.1 mg/dL (ref 8.4–10.5)
Chloride: 104 mEq/L (ref 96–112)
Creatinine, Ser: 0.9 mg/dL (ref 0.4–1.2)
GFR: 87.99 mL/min (ref >60.00)
Glucose, Bld: 99 mg/dL (ref 70–99)
Potassium: 3.2 mEq/L — ABNORMAL LOW (ref 3.5–5.1)
Sodium: 141 mEq/L (ref 135–145)

## 2011-09-17 LAB — POCT INFLUENZA A/B
Influenza A, POC: NEGATIVE
Influenza B, POC: NEGATIVE

## 2011-09-17 MED ORDER — CIPROFLOXACIN HCL 500 MG PO TABS: 500.0000 mg | ORAL_TABLET | Freq: Two times a day (BID) | ORAL | Status: DC

## 2011-09-17 MED ORDER — METRONIDAZOLE 500 MG PO TABS: 500.0000 mg | ORAL_TABLET | Freq: Three times a day (TID) | ORAL | Status: DC

## 2011-09-17 MED ORDER — POTASSIUM CHLORIDE CRYS ER 20 MEQ PO TBCR: 20.0000 meq | EXTENDED_RELEASE_TABLET | Freq: Every day | ORAL | Status: DC

## 2011-09-17 NOTE — Progress Notes (Signed)
Addended by: Simeon Craft on: 09/17/2011 06:02 PM   Modules accepted: Orders

## 2011-09-17 NOTE — Telephone Encounter (Signed)
Called pt and she is aware of appt on 09/17/11 with Dr. Artist Pais.

## 2011-09-17 NOTE — Patient Instructions (Signed)
Please call our office if your symptoms do not improve or gets worse.  

## 2011-09-17 NOTE — Progress Notes (Signed)
Subjective:    Patient ID: [name], female    DOB: [birth year], 50 y.o.   MRN: [id number]  HPI  50 year old African American female complains of headache achiness and abdominal pain x2 days. Her symptoms started on Monday with headache and she self treated with Coricidin and Tylenol over-the-counter. Her symptoms progressed to cough and abdominal pain. She reports abdominal discomfort when she takes a deep breath. She was febrile yesterday.  She reports loose stools. Review of Systems Negative for hematochezia or melena    Past Medical History  Diagnosis Date  . Osteoarthritis   . Headache   . Hypertension   . Low back pain     dr Vear Clock, pain management    History   Social History  . Marital Status: Married    Spouse Name: N/A    Number of Children: N/A  . Years of Education: N/A   Occupational History  . Not on file.   Social History Main Topics  . Smoking status: Current Everyday Smoker -- 0.2 packs/day    Types: Cigarettes  . Smokeless tobacco: Never Used  . Alcohol Use: No  . Drug Use: No  . Sexually Active: Not on file   Other Topics Concern  . Not on file   Social History Narrative  . No narrative on file    Past Surgical History  Procedure Date  . Replacement total knee     right - dr Margreta Journey  . Ganglion cyst excision     right wrist  . Abdominal hysterectomy 06/22/06    lap, dr Su Hilt  . Lipoma excision 02/21/08    right hip, [facility]  . Replacement total knee 06/25/09    left - dr Merrilee Seashore [facility]  . Tonsillectomy     Family History  Problem Relation Age of Onset  . Cancer Other     colon,stomach,prostate  . Crohn's disease Other     nephew  . Heart disease Other     Allergies  Allergen Reactions  . Codeine   . Sulfonamide Derivatives     REACTION: hives    Current Outpatient Prescriptions on File Prior to Visit  Medication Sig Dispense Refill  . amitriptyline (ELAVIL) 25 MG tablet Take 25 mg by mouth at bedtime.        Marland Kitchen  amLODipine (NORVASC) 5 MG tablet Take 1 tablet (5 mg total) by mouth 2 (two) times daily.  60 tablet  11  . methadone (DOLOPHINE) 10 MG tablet Take 10 mg by mouth every 8 (eight) hours.        . metoprolol (LOPRESSOR) 50 MG tablet Take 1 tablet (50 mg total) by mouth 2 (two) times daily.  60 tablet  11  . omeprazole (PRILOSEC) 40 MG capsule Take 1 capsule (40 mg total) by mouth daily.  30 capsule  11  . oxyCODONE (ROXICODONE) 5 MG immediate release tablet Take 5 mg by mouth. 1-2 every 3 hours as needed for pain         BP 130/82  Pulse 92  Temp(Src) 99.5 F (37.5 C) (Oral)  Wt 242 lb (109.77 kg)  SpO2 92%    Objective:   Physical Exam  Constitutional: She is oriented to person, place, and time. She appears well-developed and well-nourished. No distress.  HENT:  Head: Normocephalic and atraumatic.  Right Ear: External ear normal.  Left Ear: External ear normal.  Cardiovascular: Normal rate, regular rhythm and normal heart sounds.   Pulmonary/Chest: Breath sounds normal. No respiratory distress.  She has no wheezes.  Abdominal: Soft. Bowel sounds are normal.       Bilateral lower abdominal tenderness, no guarding, no mass, no rebound  Neurological: She is alert and oriented to person, place, and time.  Skin: Skin is warm and dry.  Psychiatric: She has a normal mood and affect. Her behavior is normal.          Assessment & Plan:

## 2011-09-17 NOTE — Assessment & Plan Note (Signed)
50 year old African female with lower abdominal pain and reported fever. Question diverticulitis. Unclear if fever etiology from abdomen versus upper respiratory infection. Empiric treatment for diverticulitis with Flagyl and Cipro.  If symptoms worsen, we discussed obtaining CT of abdomen and pelvis.  Obtain CBCD, BMET, and LFTs.  Patient advised to call office if symptoms persist or worsen.

## 2011-09-17 NOTE — Telephone Encounter (Signed)
Pt has a cough,fever,headaches,body ache and back pain. Pt refuses to see another doctor. Pt req call back from nurse asap.

## 2011-09-17 NOTE — Assessment & Plan Note (Signed)
Patient notified of low potassium.  Patient advised to take 20 meq of KCL x 1 week and follow up with PCP for repeat BMET.

## 2011-09-19 ENCOUNTER — Telehealth: Payer: Self-pay

## 2011-09-19 MED ORDER — AMOXICILLIN-POT CLAVULANATE 500-125 MG PO TABS: 1.0000 | ORAL_TABLET | Freq: Two times a day (BID) | ORAL | Status: AC

## 2011-09-19 NOTE — Telephone Encounter (Signed)
Stop both cipro and flagyl.  See new rx for augmentin

## 2011-09-19 NOTE — Telephone Encounter (Signed)
Pt states she was in to see Dr. Artist Pais on 09/17/11. Pt was prescribed flagyl and Cipro. Pt is concerned about the high dosage of medication.  Pt states she takes methadone as well and the pharmacist told pt that one of the antibiotics along with the methadone could cause some dizziness.  Pt states she is still feeling weak and today was the first day since 09/16/11 that she had gotten up to do some housework.    Pt advised to take it slowly especially since today was the first day getting up and moving around.  Pt would like to know Dr. Olegario Messier recommendations about antibiotics.

## 2011-09-19 NOTE — Telephone Encounter (Signed)
Pt aware.

## 2011-09-26 ENCOUNTER — Ambulatory Visit: Payer: 59 | Admitting: Family Medicine

## 2011-09-30 ENCOUNTER — Encounter: Payer: Self-pay | Admitting: Family Medicine

## 2011-09-30 ENCOUNTER — Ambulatory Visit (INDEPENDENT_AMBULATORY_CARE_PROVIDER_SITE_OTHER): Payer: 59 | Admitting: Family Medicine

## 2011-09-30 VITALS — BP 140/98 | HR 85 | Temp 98.0°F | Wt 258.0 lb

## 2011-09-30 DIAGNOSIS — K8020 Calculus of gallbladder without cholecystitis without obstruction: Secondary | ICD-10-CM

## 2011-09-30 DIAGNOSIS — R1013 Epigastric pain: Secondary | ICD-10-CM

## 2011-09-30 NOTE — Progress Notes (Signed)
  Subjective:    Patient ID: [name], female    DOB: 1961/05/08, 51 y.o.   MRN: [id number]  HPI Here for abdominal pains. She was seen on 09-16-12 for this and it was felt to be possible diverticulitis. She was started on Flagyl and Cipro for several days, but did not tolerate these well. She was then switched to Augmentin. She feels better in the sense that her coughing and body aches are gone, but she still has epigastric pains and diarrhea every time she eats anything. No nausea or vomiting. No fever. She had a colonoscopy and a CT of the abdomen and pelvis a year ago, and these were unremarkable. No urinary symptoms. Her labs from 2 weeks ago were normal except for a low potassium.    Review of Systems  Constitutional: Negative.   Respiratory: Negative.   Cardiovascular: Negative.   Gastrointestinal: Positive for abdominal pain and diarrhea. Negative for nausea, vomiting, constipation, blood in stool and abdominal distention.  Genitourinary: Negative.        Objective:   Physical Exam  Constitutional: She appears well-developed and well-nourished.  Abdominal: Soft. Bowel sounds are normal. She exhibits no distension and no mass. There is tenderness. There is no rebound and no guarding.       Moderately tender in both upper quadrants and the epigastrium           Assessment & Plan:  Epigastric pain of uncertain etiology. Certainly gall bladder disease could cause these symptoms. Ulcers could do this as well, but this is less likely since she takes Prilosec daily. We will set her up for an abdominal US soon. She is using Imodium prn

## 2011-10-02 ENCOUNTER — Ambulatory Visit
Admission: RE | Admit: 2011-10-02 | Discharge: 2011-10-02 | Disposition: A | Discharge status: Home / Self Care | Payer: 59 | Source: Ambulatory Visit | Attending: Family Medicine | Admitting: Family Medicine

## 2011-10-02 DIAGNOSIS — R1013 Epigastric pain: Secondary | ICD-10-CM

## 2011-10-02 NOTE — Progress Notes (Signed)
Addended by: Gershon Crane A on: 10/02/2011 10:11 PM   Modules accepted: Orders

## 2011-10-07 ENCOUNTER — Encounter (INDEPENDENT_AMBULATORY_CARE_PROVIDER_SITE_OTHER): Payer: Self-pay | Admitting: Surgery

## 2011-10-08 ENCOUNTER — Ambulatory Visit (INDEPENDENT_AMBULATORY_CARE_PROVIDER_SITE_OTHER): Payer: 59 | Admitting: Surgery

## 2011-10-08 ENCOUNTER — Encounter (INDEPENDENT_AMBULATORY_CARE_PROVIDER_SITE_OTHER): Payer: Self-pay | Admitting: Surgery

## 2011-10-08 DIAGNOSIS — K8010 Calculus of gallbladder with chronic cholecystitis without obstruction: Secondary | ICD-10-CM | POA: Insufficient documentation

## 2011-10-08 DIAGNOSIS — R109 Unspecified abdominal pain: Secondary | ICD-10-CM

## 2011-10-08 NOTE — Patient Instructions (Signed)
[facility] LAPAROSCOPIC SURGERY: POST OP INSTRUCTIONS  Always review your discharge instruction sheet given to you by the facility where your surgery was performed.  1. A prescription for pain medication may be given to you upon discharge.  Take your pain medication as prescribed, if needed.  If narcotic pain medicine is not needed, then you may take acetaminophen (Tylenol) or ibuprofen (Advil) as needed. 2. Take your usually prescribed medications unless otherwise directed. 3. If you need a refill on your pain medication, please contact your pharmacy.  They will contact our office to request authorization. Prescriptions will not be filled after 5pm or on week-ends. 4. You should follow a light diet the first few days after arrival home, such as soup and crackers, etc.  Be sure to include lots of fluids daily. 5. Most patients will experience some swelling and bruising in the area of the incisions.  Ice packs will help.  Swelling and bruising can take several days to resolve.  6. It is common to experience some constipation if taking pain medication after surgery.  Increasing fluid intake and taking a stool softener (such as Colace) will usually help or prevent this problem from occurring.  A mild laxative (Milk of Magnesia or Miralax) should be taken according to package instructions if there are no bowel movements after 48 hours. 7. Unless discharge instructions indicate otherwise, you may remove your bandages 24-48 hours after surgery, and you may shower at that time.  You may have steri-strips (small skin tapes) in place directly over the incision.  These strips should be left on the skin for 7-10 days.  If your surgeon used skin glue on the incision, you may shower in 24 hours.  The glue will flake off over the next 2-3 weeks.  Any sutures or staples will be removed at the office during your follow-up visit. 8. ACTIVITIES:  You may resume regular (light) daily activities  beginning the next day-such as daily self-care, walking, climbing stairs-gradually increasing activities as tolerated.  You may have sexual intercourse when it is comfortable.  Refrain from any heavy lifting or straining until approved by your doctor. 9. You may drive when you are no longer taking prescription pain medication, you can comfortably wear a seatbelt, and you can safely maneuver your car and apply brakes. 10. You should see your doctor in the office for a follow-up appointment approximately 2-3 weeks after your surgery.  Make sure that you call for this appointment within a day or two after you arrive home to insure a convenient appointment time.  WHEN TO CALL YOUR DOCTOR: 1. Fever over 101.0 2. Inability to urinate 3. Continued bleeding from incision. 4. Increased pain, redness, or drainage from the incision. 5. Increasing abdominal pain  The clinic staff is available to answer your questions during regular business hours.  Please don't hesitate to call and ask to speak to one of the nurses for clinical concerns.  If you have a medical emergency, go to the nearest emergency room or call 911.  A surgeon from [facility] is always on call at the hospital. [phone] ? [phone] ? FAX [phone] Web site: [email or web address]  

## 2011-10-08 NOTE — Progress Notes (Signed)
Chief Complaint  Patient presents with  . New Evaluation    Eval gallbladder with stones - referral by Dr. Gershon Crane   HISTORY: Patient is a 51 year old black female who presents at the request of her primary physician for symptomatic cholelithiasis. Patient has had intermittent epigastric and right upper quadrant abdominal pain for several months. Pain has become more severe. She has experienced episodes of nausea and vomiting. She was seen by her primary physician and initially treated for suspected diverticulitis with antibiotics without symptomatic relief. Patient was reevaluated in January 2013. She underwent abdominal ultrasound which shows multiple small gallstones. Patient is now referred to general surgery for consideration for cholecystectomy.  No family history of gallbladder disease. Patient's mother did undergo gastric resection for stomach cancer and is alive and doing well. Patient has had a tubal ligation and total abdominal hysterectomy. She has had no other abdominal surgery. Patient denies any history of hepatitis or pancreatitis. She denies jaundice or acholic stools.  Past Medical History  Diagnosis Date  . Osteoarthritis   . Headache     migraine like per patient  . Hypertension   . Low back pain     dr phillips, pain management  . Degenerative joint disease   . Gallstones      Current Outpatient Prescriptions  Medication Sig Dispense Refill  . amitriptyline (ELAVIL) 25 MG tablet Take 25 mg by mouth at bedtime.        Marland Kitchen amLODipine (NORVASC) 5 MG tablet Take 1 tablet (5 mg total) by mouth 2 (two) times daily.  60 tablet  11  . methadone (DOLOPHINE) 10 MG tablet Take 10 mg by mouth every 8 (eight) hours.        . metoprolol (LOPRESSOR) 50 MG tablet Take 1 tablet (50 mg total) by mouth 2 (two) times daily.  60 tablet  11  . omeprazole (PRILOSEC) 40 MG capsule Take 1 capsule (40 mg total) by mouth daily.  30 capsule  11  . oxyCODONE (ROXICODONE) 5 MG immediate  release tablet Take 5 mg by mouth. 1-2 every 3 hours as needed for pain          Allergies  Allergen Reactions  . Codeine Itching and Nausea And Vomiting    Itching all over the body  . Sulfonamide Derivatives Hives    All over the body     Family History  Problem Relation Age of Onset  . Crohn's disease Other     nephew  . Cancer Mother     stomach  . Heart disease Father   . Cancer Sister     kidney - one out of the four  . Hypertension Sister   . Hypertension Brother      History   Social History  . Marital Status: Married    Spouse Name: N/A    Number of Children: N/A  . Years of Education: N/A   Social History Main Topics  . Smoking status: Current Everyday Smoker -- 0.2 packs/day    Types: Cigarettes  . Smokeless tobacco: Never Used  . Alcohol Use: No  . Drug Use: No  . Sexually Active: None   Other Topics Concern  . None   Social History Narrative  . None     REVIEW OF SYSTEMS - PERTINENT POSITIVES ONLY: Denies jaundice. Denies acholic stools. Denies fever. Denies chills. Denies hepatitis. Denies pancreatitis.  EXAM: Filed Vitals:   10/08/11 0855  BP: 138/82  Pulse: 70  Temp: 97.9 F (36.6  C)  Resp: 18    HEENT: normocephalic; pupils equal and reactive; sclerae clear; dentition good; mucous membranes moist NECK:  No nodules; symmetric on extension; no palpable anterior or posterior cervical lymphadenopathy; no supraclavicular masses; no tenderness CHEST: clear to auscultation bilaterally without rales, rhonchi, or wheezes CARDIAC: regular rate and rhythm without significant murmur; peripheral pulses are full ABDOMEN: soft without distension; bowel sounds present; no mass; no hepatosplenomegaly; no hernia; mild tenderness RUQ; well healed incision at umbilicus EXT:  non-tender without edema; no deformity NEURO: no gross focal deficits; no sign of tremor   LABORATORY RESULTS: See [facility] (CHL-Epic) for most recent  results   RADIOLOGY RESULTS: See [facility] (CHL-Epic) for most recent results   IMPRESSION: Symptomatic cholelithiasis  PLAN: The patient and I had a discussion regarding the above findings. We discussed laparoscopic cholecystectomy. We discussed the procedure in the hospital stay to be anticipated. We discussed performing intraoperative cholangiography. We discussed the potential for conversion to open surgery. She understands and wishes to proceed. We will make arrangements for her procedure in the near future.  The risks and benefits of the procedure have been discussed at length with the patient.  The patient understands the proposed procedure, potential alternative treatments, and the course of recovery to be expected.  All of the patient's questions have been answered at this time.  The patient wishes to proceed with surgery and will schedule a date for their procedure through our office staff.  Velora Heckler, MD, FACS General & Endocrine Surgery [facility]   Visit Diagnoses: 1. Abdominal pain   2. Cholelithiasis with cholecystitis     Primary Care Physician: Nelwyn Salisbury, MD

## 2011-10-08 NOTE — Progress Notes (Signed)
Quick Note:  Spoke with pt ______ 

## 2011-10-14 ENCOUNTER — Encounter (HOSPITAL_COMMUNITY): Payer: Self-pay | Admitting: Pharmacy Technician

## 2011-10-21 ENCOUNTER — Other Ambulatory Visit: Payer: Self-pay

## 2011-10-21 ENCOUNTER — Encounter (HOSPITAL_COMMUNITY)
Admission: RE | Admit: 2011-10-21 | Discharge: 2011-10-21 | Disposition: A | Discharge status: Home / Self Care | Payer: 59 | Source: Ambulatory Visit | Attending: Surgery | Admitting: Surgery

## 2011-10-21 ENCOUNTER — Encounter (HOSPITAL_COMMUNITY): Payer: Self-pay

## 2011-10-21 HISTORY — DX: Gastro-esophageal reflux disease without esophagitis: K21.9

## 2011-10-21 LAB — BASIC METABOLIC PANEL
BUN: 12 mg/dL (ref 6–23)
CO2: 29 mEq/L (ref 19–32)
Calcium: 9.3 mg/dL (ref 8.4–10.5)
Chloride: 103 mEq/L (ref 96–112)
Creatinine, Ser: 0.78 mg/dL (ref 0.50–1.10)
GFR calc Af Amer: >90 mL/min (ref >90)
GFR calc non Af Amer: >90 mL/min (ref >90)
Glucose, Bld: 92 mg/dL (ref 70–99)
Potassium: 3.4 mEq/L — ABNORMAL LOW (ref 3.5–5.1)
Sodium: 141 mEq/L (ref 135–145)

## 2011-10-21 LAB — CBC
HCT: 39.7 % (ref 36.0–46.0)
Hemoglobin: 13 g/dL (ref 12.0–15.0)
MCH: 28.7 pg (ref 26.0–34.0)
MCHC: 32.7 g/dL (ref 30.0–36.0)
MCV: 87.6 fL (ref 78.0–100.0)
Platelets: 331 10*3/uL (ref 150–400)
RBC: 4.53 MIL/uL (ref 3.87–5.11)
RDW: 14.1 % (ref 11.5–15.5)
WBC: 6 10*3/uL (ref 4.0–10.5)

## 2011-10-21 LAB — SURGICAL PCR SCREEN
MRSA, PCR: NEGATIVE
Staphylococcus aureus: NEGATIVE

## 2011-10-21 NOTE — Patient Instructions (Signed)
20 [name]  10/21/2011   Your procedure is scheduled on:  10/28/11 0920am-1050am  Report to [facility] at 0720 AM.  Call this number if you have problems the morning of surgery: [phone]   Remember:   Do not eat food:After Midnight.  May have clear liquids:until Midnight .    Take these medicines the morning of surgery with A SIP OF WATER:    Do not wear jewelry, make-up or nail polish.  Do not wear lotions, powders, or perfumes.   Do not shave 48 hours prior to surgery.  Do not bring valuables to the hospital.  Contacts, dentures or bridgework may not be worn into surgery.  Leave suitcase in the car. After surgery it may be brought to your room.  For patients admitted to the hospital, checkout time is 11:00 AM the day of discharge.     Special Instructions: CHG Shower Use Special Wash: 1/2 bottle night before surgery and 1/2 bottle morning of surgery. shower chin to toes with CHG.  Wash face and private parts with regular soap.     Please read over the following fact sheets that you were given: MRSA Information, coughing and deep breathing exercises, leg exercises

## 2011-10-28 ENCOUNTER — Ambulatory Visit (HOSPITAL_COMMUNITY)
Admission: RE | Admit: 2011-10-28 | Discharge: 2011-10-29 | Disposition: A | Discharge status: Home / Self Care | Payer: 59 | Source: Ambulatory Visit | Attending: Surgery | Admitting: Surgery

## 2011-10-28 ENCOUNTER — Encounter (HOSPITAL_COMMUNITY): Payer: Self-pay | Admitting: *Deleted

## 2011-10-28 ENCOUNTER — Encounter (HOSPITAL_COMMUNITY): Payer: Self-pay | Admitting: Anesthesiology

## 2011-10-28 ENCOUNTER — Ambulatory Visit (HOSPITAL_COMMUNITY): Payer: 59

## 2011-10-28 ENCOUNTER — Ambulatory Visit (HOSPITAL_COMMUNITY): Payer: 59 | Admitting: Anesthesiology

## 2011-10-28 ENCOUNTER — Encounter (HOSPITAL_COMMUNITY)
Admission: RE | Disposition: A | Discharge status: Home / Self Care | Payer: Self-pay | Source: Ambulatory Visit | Attending: Surgery

## 2011-10-28 ENCOUNTER — Other Ambulatory Visit (INDEPENDENT_AMBULATORY_CARE_PROVIDER_SITE_OTHER): Payer: Self-pay | Admitting: Surgery

## 2011-10-28 DIAGNOSIS — M1990 Unspecified osteoarthritis, unspecified site: Secondary | ICD-10-CM | POA: Insufficient documentation

## 2011-10-28 DIAGNOSIS — R110 Nausea: Secondary | ICD-10-CM | POA: Insufficient documentation

## 2011-10-28 DIAGNOSIS — K8010 Calculus of gallbladder with chronic cholecystitis without obstruction: Secondary | ICD-10-CM | POA: Insufficient documentation

## 2011-10-28 DIAGNOSIS — Z79899 Other long term (current) drug therapy: Secondary | ICD-10-CM | POA: Insufficient documentation

## 2011-10-28 DIAGNOSIS — Z01810 Encounter for preprocedural cardiovascular examination: Secondary | ICD-10-CM | POA: Insufficient documentation

## 2011-10-28 DIAGNOSIS — R1011 Right upper quadrant pain: Secondary | ICD-10-CM | POA: Insufficient documentation

## 2011-10-28 DIAGNOSIS — Z01812 Encounter for preprocedural laboratory examination: Secondary | ICD-10-CM | POA: Insufficient documentation

## 2011-10-28 DIAGNOSIS — I10 Essential (primary) hypertension: Secondary | ICD-10-CM | POA: Insufficient documentation

## 2011-10-28 HISTORY — PX: CHOLECYSTECTOMY: SHX55

## 2011-10-28 SURGERY — LAPAROSCOPIC CHOLECYSTECTOMY WITH INTRAOPERATIVE CHOLANGIOGRAM
Anesthesia: General | Site: Abdomen | Wound class: Clean Contaminated

## 2011-10-28 MED ORDER — PROMETHAZINE HCL 25 MG/ML IJ SOLN: 6.2500 mg | INTRAMUSCULAR | Status: DC | PRN

## 2011-10-28 MED ORDER — HYDROMORPHONE HCL PF 1 MG/ML IJ SOLN
0.2500 mg | INTRAMUSCULAR | Status: DC | PRN
  Administered 2011-10-28 (×4): 0.5 mg via INTRAVENOUS

## 2011-10-28 MED ORDER — FENTANYL CITRATE 0.05 MG/ML IJ SOLN
INTRAMUSCULAR | Status: DC | PRN
  Administered 2011-10-28 (×2): 100 ug via INTRAVENOUS
  Administered 2011-10-28: 50 ug via INTRAVENOUS

## 2011-10-28 MED ORDER — CEFAZOLIN SODIUM-DEXTROSE 2-3 GM-% IV SOLR
2.0000 g | INTRAVENOUS | Status: AC
  Administered 2011-10-28: 2 g via INTRAVENOUS

## 2011-10-28 MED ORDER — DEXAMETHASONE SODIUM PHOSPHATE 10 MG/ML IJ SOLN
INTRAMUSCULAR | Status: DC | PRN
  Administered 2011-10-28: 10 mg via INTRAVENOUS

## 2011-10-28 MED ORDER — METOPROLOL TARTRATE 50 MG PO TABS
50.0000 mg | ORAL_TABLET | Freq: Two times a day (BID) | ORAL | Status: DC
Start: 2011-10-28 — End: 2011-10-29
  Administered 2011-10-28 – 2011-10-29 (×2): 50 mg via ORAL
  Filled 2011-10-28 (×4): qty 1

## 2011-10-28 MED ORDER — ONDANSETRON HCL 4 MG/2ML IJ SOLN
INTRAMUSCULAR | Status: DC | PRN
  Administered 2011-10-28: 4 mg via INTRAVENOUS

## 2011-10-28 MED ORDER — OXYCODONE HCL 5 MG PO TABS
10.0000 mg | ORAL_TABLET | ORAL | Status: DC | PRN
  Administered 2011-10-28 – 2011-10-29 (×5): 10 mg via ORAL
  Filled 2011-10-28 (×5): qty 2

## 2011-10-28 MED ORDER — HYDROMORPHONE HCL PF 1 MG/ML IJ SOLN
INTRAMUSCULAR | Status: AC
  Filled 2011-10-28: qty 1

## 2011-10-28 MED ORDER — METHADONE HCL 10 MG PO TABS
10.0000 mg | ORAL_TABLET | Freq: Three times a day (TID) | ORAL | Status: DC
  Administered 2011-10-28: 10 mg via ORAL
  Filled 2011-10-28: qty 1

## 2011-10-28 MED ORDER — IOHEXOL 300 MG/ML  SOLN
INTRAMUSCULAR | Status: DC | PRN
  Administered 2011-10-28: 10 mL via INTRAVENOUS

## 2011-10-28 MED ORDER — KCL IN DEXTROSE-NACL 20-5-0.45 MEQ/L-%-% IV SOLN
INTRAVENOUS | Status: DC
  Administered 2011-10-28: 1000 mL via INTRAVENOUS
  Filled 2011-10-28 (×3): qty 1000

## 2011-10-28 MED ORDER — OXYCODONE-ACETAMINOPHEN 5-325 MG PO TABS
1.0000 | ORAL_TABLET | ORAL | Status: DC | PRN
  Filled 2011-10-28: qty 1

## 2011-10-28 MED ORDER — PROPOFOL 10 MG/ML IV EMUL
INTRAVENOUS | Status: DC | PRN
  Administered 2011-10-28: 150 mg via INTRAVENOUS

## 2011-10-28 MED ORDER — DIPHENHYDRAMINE HCL 50 MG/ML IJ SOLN
25.0000 mg | Freq: Four times a day (QID) | INTRAMUSCULAR | Status: DC | PRN
  Administered 2011-10-28: 25 mg via INTRAVENOUS
  Filled 2011-10-28: qty 1

## 2011-10-28 MED ORDER — LIDOCAINE HCL (CARDIAC) 20 MG/ML IV SOLN
INTRAVENOUS | Status: DC | PRN
  Administered 2011-10-28: 100 mg via INTRAVENOUS

## 2011-10-28 MED ORDER — AMITRIPTYLINE HCL 25 MG PO TABS
25.0000 mg | ORAL_TABLET | Freq: Every day | ORAL | Status: DC
  Administered 2011-10-28: 25 mg via ORAL
  Filled 2011-10-28 (×2): qty 1

## 2011-10-28 MED ORDER — BUPIVACAINE-EPINEPHRINE 0.5% -1:200000 IJ SOLN
INTRAMUSCULAR | Status: DC | PRN
  Administered 2011-10-28: 18 mL

## 2011-10-28 MED ORDER — PANTOPRAZOLE SODIUM 40 MG PO TBEC
40.0000 mg | DELAYED_RELEASE_TABLET | Freq: Every day | ORAL | Status: DC
  Administered 2011-10-29: 40 mg via ORAL
  Filled 2011-10-28: qty 1

## 2011-10-28 MED ORDER — INFLUENZA VIRUS VACC SPLIT PF IM SUSP
0.5000 mL | INTRAMUSCULAR | Status: AC
  Administered 2011-10-29: 0.5 mL via INTRAMUSCULAR
  Filled 2011-10-28: qty 0.5

## 2011-10-28 MED ORDER — ROCURONIUM BROMIDE 100 MG/10ML IV SOLN
INTRAVENOUS | Status: DC | PRN
  Administered 2011-10-28: 30 mg via INTRAVENOUS
  Administered 2011-10-28: 10 mg via INTRAVENOUS

## 2011-10-28 MED ORDER — AMLODIPINE BESYLATE 5 MG PO TABS
5.0000 mg | ORAL_TABLET | Freq: Two times a day (BID) | ORAL | Status: DC
  Administered 2011-10-28: 5 mg via ORAL
  Filled 2011-10-28 (×4): qty 1

## 2011-10-28 MED ORDER — GLYCOPYRROLATE 0.2 MG/ML IJ SOLN
INTRAMUSCULAR | Status: DC | PRN
  Administered 2011-10-28: .8 mg via INTRAVENOUS

## 2011-10-28 MED ORDER — PROMETHAZINE HCL 25 MG/ML IJ SOLN
12.5000 mg | Freq: Four times a day (QID) | INTRAMUSCULAR | Status: DC | PRN
  Filled 2011-10-28: qty 1

## 2011-10-28 MED ORDER — ACETAMINOPHEN 10 MG/ML IV SOLN
INTRAVENOUS | Status: DC | PRN
  Administered 2011-10-28: 1000 mg via INTRAVENOUS

## 2011-10-28 MED ORDER — LACTATED RINGERS IV SOLN
INTRAVENOUS | Status: DC
  Administered 2011-10-28: 10:00:00 via INTRAVENOUS
  Administered 2011-10-28: 1000 mL via INTRAVENOUS

## 2011-10-28 MED ORDER — ACETAMINOPHEN 325 MG PO TABS
650.0000 mg | ORAL_TABLET | ORAL | Status: DC | PRN
  Administered 2011-10-28 – 2011-10-29 (×2): 650 mg via ORAL
  Filled 2011-10-28 (×2): qty 2

## 2011-10-28 MED ORDER — OXYCODONE-ACETAMINOPHEN 5-325 MG PO TABS: 1.0000 | ORAL_TABLET | ORAL | Status: DC | PRN

## 2011-10-28 MED ORDER — SUCCINYLCHOLINE CHLORIDE 20 MG/ML IJ SOLN
INTRAMUSCULAR | Status: DC | PRN
  Administered 2011-10-28: 100 mg via INTRAVENOUS

## 2011-10-28 MED ORDER — HYDROMORPHONE HCL PF 1 MG/ML IJ SOLN
1.0000 mg | INTRAMUSCULAR | Status: DC | PRN
  Administered 2011-10-28: 1 mg via INTRAVENOUS
  Filled 2011-10-28: qty 1

## 2011-10-28 MED ORDER — LACTATED RINGERS IR SOLN
Status: DC | PRN
  Administered 2011-10-28: 1000 mL

## 2011-10-28 MED ORDER — NEOSTIGMINE METHYLSULFATE 1 MG/ML IJ SOLN
INTRAMUSCULAR | Status: DC | PRN
  Administered 2011-10-28: 5 mg via INTRAVENOUS

## 2011-10-28 MED ORDER — MIDAZOLAM HCL 5 MG/5ML IJ SOLN
INTRAMUSCULAR | Status: DC | PRN
  Administered 2011-10-28: 2 mg via INTRAVENOUS

## 2011-10-28 SURGICAL SUPPLY — 41 items
APL SKNCLS STERI-STRIP NONHPOA (GAUZE/BANDAGES/DRESSINGS) ×1
APPLIER CLIP ROT 10 11.4 M/L (STAPLE) ×2
APR CLP MED LRG 11.4X10 (STAPLE) ×1
BAG SPEC RTRVL LRG 6X4 10 (ENDOMECHANICALS) ×1
BENZOIN TINCTURE PRP APPL 2/3 (GAUZE/BANDAGES/DRESSINGS) ×2 IMPLANT
CABLE HIGH FREQUENCY MONO STRZ (ELECTRODE) ×2 IMPLANT
CANISTER SUCTION 2500CC (MISCELLANEOUS) ×2 IMPLANT
CHLORAPREP W/TINT 26ML (MISCELLANEOUS) ×2 IMPLANT
CLIP APPLIE ROT 10 11.4 M/L (STAPLE) ×1 IMPLANT
CLOSURE STERI STRIP 1/2 X4 (GAUZE/BANDAGES/DRESSINGS) ×1 IMPLANT
CLOTH BEACON ORANGE TIMEOUT ST (SAFETY) ×2 IMPLANT
COVER MAYO STAND STRL (DRAPES) ×2 IMPLANT
DECANTER SPIKE VIAL GLASS SM (MISCELLANEOUS) ×2 IMPLANT
DRAPE C-ARM 42X72 X-RAY (DRAPES) ×2 IMPLANT
DRAPE LAPAROSCOPIC ABDOMINAL (DRAPES) ×2 IMPLANT
ELECT REM PT RETURN 9FT ADLT (ELECTROSURGICAL) ×2
ELECTRODE REM PT RTRN 9FT ADLT (ELECTROSURGICAL) ×1 IMPLANT
GAUZE SPONGE 4X4 12PLY STRL LF (GAUZE/BANDAGES/DRESSINGS) ×1 IMPLANT
GLOVE BIOGEL PI IND STRL 7.0 (GLOVE) ×1 IMPLANT
GLOVE BIOGEL PI INDICATOR 7.0 (GLOVE) ×1
GLOVE SURG ORTHO 8.0 STRL STRW (GLOVE) ×2 IMPLANT
GOWN STRL NON-REIN LRG LVL3 (GOWN DISPOSABLE) ×2 IMPLANT
GOWN STRL REIN XL XLG (GOWN DISPOSABLE) ×4 IMPLANT
HEMOSTAT SURGICEL 4X8 (HEMOSTASIS) IMPLANT
KIT BASIN OR (CUSTOM PROCEDURE TRAY) ×2 IMPLANT
NS IRRIG 1000ML POUR BTL (IV SOLUTION) ×2 IMPLANT
POUCH SPECIMEN RETRIEVAL 10MM (ENDOMECHANICALS) ×2 IMPLANT
SCISSORS LAP 5X35 DISP (ENDOMECHANICALS) IMPLANT
SET CHOLANGIOGRAPH MIX (MISCELLANEOUS) ×2 IMPLANT
SET IRRIG TUBING LAPAROSCOPIC (IRRIGATION / IRRIGATOR) ×2 IMPLANT
SLEEVE Z-THREAD 5X100MM (TROCAR) ×2 IMPLANT
SOLUTION ANTI FOG 6CC (MISCELLANEOUS) ×2 IMPLANT
STRIP CLOSURE SKIN 1/2X4 (GAUZE/BANDAGES/DRESSINGS) ×2 IMPLANT
SUT MNCRL AB 4-0 PS2 18 (SUTURE) ×2 IMPLANT
TAPE CLOTH SURG 4X10 WHT LF (GAUZE/BANDAGES/DRESSINGS) ×1 IMPLANT
TOWEL OR 17X26 10 PK STRL BLUE (TOWEL DISPOSABLE) ×6 IMPLANT
TRAY LAP CHOLE (CUSTOM PROCEDURE TRAY) ×2 IMPLANT
TROCAR XCEL BLUNT TIP 100MML (ENDOMECHANICALS) ×2 IMPLANT
TROCAR Z-THREAD FIOS 11X100 BL (TROCAR) ×2 IMPLANT
TROCAR Z-THREAD FIOS 5X100MM (TROCAR) ×2 IMPLANT
TUBING INSUFFLATION 10FT LAP (TUBING) ×2 IMPLANT

## 2011-10-28 NOTE — H&P (View-Only) (Signed)
Chief Complaint  Patient presents with  . New Evaluation    Eval gallbladder with stones - referral by Dr. Stephen Fry   HISTORY: Patient is a 50-year-old black female who presents at the request of her primary physician for symptomatic cholelithiasis. Patient has had intermittent epigastric and right upper quadrant abdominal pain for several months. Pain has become more severe. She has experienced episodes of nausea and vomiting. She was seen by her primary physician and initially treated for suspected diverticulitis with antibiotics without symptomatic relief. Patient was reevaluated in January 2013. She underwent abdominal ultrasound which shows multiple small gallstones. Patient is now referred to general surgery for consideration for cholecystectomy.  No family history of gallbladder disease. Patient's mother did undergo gastric resection for stomach cancer and is alive and doing well. Patient has had a tubal ligation and total abdominal hysterectomy. She has had no other abdominal surgery. Patient denies any history of hepatitis or pancreatitis. She denies jaundice or acholic stools.  Past Medical History  Diagnosis Date  . Osteoarthritis   . Headache     migraine like per patient  . Hypertension   . Low back pain     dr phillips, pain management  . Degenerative joint disease   . Gallstones      Current Outpatient Prescriptions  Medication Sig Dispense Refill  . amitriptyline (ELAVIL) 25 MG tablet Take 25 mg by mouth at bedtime.        . amLODipine (NORVASC) 5 MG tablet Take 1 tablet (5 mg total) by mouth 2 (two) times daily.  60 tablet  11  . methadone (DOLOPHINE) 10 MG tablet Take 10 mg by mouth every 8 (eight) hours.        . metoprolol (LOPRESSOR) 50 MG tablet Take 1 tablet (50 mg total) by mouth 2 (two) times daily.  60 tablet  11  . omeprazole (PRILOSEC) 40 MG capsule Take 1 capsule (40 mg total) by mouth daily.  30 capsule  11  . oxyCODONE (ROXICODONE) 5 MG immediate  release tablet Take 5 mg by mouth. 1-2 every 3 hours as needed for pain          Allergies  Allergen Reactions  . Codeine Itching and Nausea And Vomiting    Itching all over the body  . Sulfonamide Derivatives Hives    All over the body     Family History  Problem Relation Age of Onset  . Crohn's disease Other     nephew  . Cancer Mother     stomach  . Heart disease Father   . Cancer Sister     kidney - one out of the four  . Hypertension Sister   . Hypertension Brother      History   Social History  . Marital Status: Married    Spouse Name: N/A    Number of Children: N/A  . Years of Education: N/A   Social History Main Topics  . Smoking status: Current Everyday Smoker -- 0.2 packs/day    Types: Cigarettes  . Smokeless tobacco: Never Used  . Alcohol Use: No  . Drug Use: No  . Sexually Active: None   Other Topics Concern  . None   Social History Narrative  . None     REVIEW OF SYSTEMS - PERTINENT POSITIVES ONLY: Denies jaundice. Denies acholic stools. Denies fever. Denies chills. Denies hepatitis. Denies pancreatitis.  EXAM: Filed Vitals:   10/08/11 0855  BP: 138/82  Pulse: 70  Temp: 97.9 F (36.6   C)  Resp: 18    HEENT: normocephalic; pupils equal and reactive; sclerae clear; dentition good; mucous membranes moist NECK:  No nodules; symmetric on extension; no palpable anterior or posterior cervical lymphadenopathy; no supraclavicular masses; no tenderness CHEST: clear to auscultation bilaterally without rales, rhonchi, or wheezes CARDIAC: regular rate and rhythm without significant murmur; peripheral pulses are full ABDOMEN: soft without distension; bowel sounds present; no mass; no hepatosplenomegaly; no hernia; mild tenderness RUQ; well healed incision at umbilicus EXT:  non-tender without edema; no deformity NEURO: no gross focal deficits; no sign of tremor   LABORATORY RESULTS: See [facility] (CHL-Epic) for most recent  results   RADIOLOGY RESULTS: See [facility] (CHL-Epic) for most recent results   IMPRESSION: Symptomatic cholelithiasis  PLAN: The patient and I had a discussion regarding the above findings. We discussed laparoscopic cholecystectomy. We discussed the procedure in the hospital stay to be anticipated. We discussed performing intraoperative cholangiography. We discussed the potential for conversion to open surgery. She understands and wishes to proceed. We will make arrangements for her procedure in the near future.  The risks and benefits of the procedure have been discussed at length with the patient.  The patient understands the proposed procedure, potential alternative treatments, and the course of recovery to be expected.  All of the patient's questions have been answered at this time.  The patient wishes to proceed with surgery and will schedule a date for their procedure through our office staff.  Giancarlo Askren M. Caliann Leckrone, MD, FACS General & Endocrine Surgery [facility]   Visit Diagnoses: 1. Abdominal pain   2. Cholelithiasis with cholecystitis     Primary Care Physician: FRY,STEPHEN A, MD   

## 2011-10-28 NOTE — Anesthesia Preprocedure Evaluation (Signed)
Anesthesia Evaluation  Patient identified by MRN, date of birth, ID band Patient awake    Reviewed: Allergy & Precautions, H&P , NPO status , Patient's Chart, lab work & pertinent test results, reviewed documented beta blocker date and time   Airway Mallampati: II TM Distance: >3 FB Neck ROM: Full    Dental  (+) Teeth Intact   Pulmonary neg pulmonary ROS,  clear to auscultation        Cardiovascular hypertension, Pt. on medications Regular Normal Denies cardiac symptoms   Neuro/Psych Negative Neurological ROS  Negative Psych ROS   GI/Hepatic Neg liver ROS, gallstones   Endo/Other  Morbid obesity  Renal/GU negative Renal ROS  Genitourinary negative   Musculoskeletal negative musculoskeletal ROS (+)   Abdominal   Peds negative pediatric ROS (+)  Hematology negative hematology ROS (+)   Anesthesia Other Findings   Reproductive/Obstetrics negative OB ROS                           Anesthesia Physical Anesthesia Plan  ASA: III  Anesthesia Plan: General   Post-op Pain Management:    Induction: Intravenous  Airway Management Planned: Oral ETT  Additional Equipment:   Intra-op Plan:   Post-operative Plan: Extubation in OR  Informed Consent: I have reviewed the patients History and Physical, chart, labs and discussed the procedure including the risks, benefits and alternatives for the proposed anesthesia with the patient or authorized representative who has indicated his/her understanding and acceptance.     Plan Discussed with: Surgeon  Anesthesia Plan Comments:         Anesthesia Quick Evaluation

## 2011-10-28 NOTE — Interval H&P Note (Signed)
History and Physical Interval Note:  10/28/2011 8:54 AM  [name]  has presented today for surgery, with the diagnosis of symptomic cholelithiasis.  The various methods of treatment have been discussed with the patient and family. After consideration of risks, benefits and other options for treatment, the patient has consented to    Procedure(s): LAPAROSCOPIC CHOLECYSTECTOMY WITH INTRAOPERATIVE CHOLANGIOGRAM as a surgical intervention .    The patients' history has been reviewed, patient examined, no change in status, stable for surgery.  I have reviewed the patients' chart and labs.  Questions were answered to the patient's satisfaction.    Velora Heckler, MD, FACS General & Endocrine Surgery [facility]   Tamisha Nordstrom Judie Petit

## 2011-10-28 NOTE — Transfer of Care (Signed)
Immediate Anesthesia Transfer of Care Note  Patient: [name]  Procedure(s) Performed:  LAPAROSCOPIC CHOLECYSTECTOMY WITH INTRAOPERATIVE CHOLANGIOGRAM  Patient Location: PACU  Anesthesia Type: General  Level of Consciousness: awake, alert  and patient cooperative  Airway & Oxygen Therapy: Patient Spontanous Breathing and Patient connected to face mask oxygen  Post-op Assessment: Report given to PACU RN, Post -op Vital signs reviewed and stable and Patient moving all extremities  Post vital signs: Reviewed and stable  Complications: No apparent anesthesia complications

## 2011-10-28 NOTE — Preoperative (Signed)
Beta Blockers   Reason not to administer Beta Blockers:pt had lopressor this am @ 0600

## 2011-10-28 NOTE — Op Note (Signed)
NAMEAMINAT, [name]               ACCOUNT NO.:  [id number]  MEDICAL RECORD NO.:  [id number]  LOCATION:  1612                         FACILITY:  [facility]  PHYSICIAN:  Velora Heckler, MD      DATE OF BIRTH:  1961-05-11  DATE OF PROCEDURE:  10/28/2011                               OPERATIVE REPORT   PREOPERATIVE DIAGNOSIS:  Symptomatic cholelithiasis, chronic cholecystitis.  POSTOPERATIVE DIAGNOSIS:  Symptomatic cholelithiasis, chronic cholecystitis.  PROCEDURE:  Laparoscopic cholecystectomy with intraoperative cholangiography.  SURGEON:  Velora Heckler, MD, FACS  ANESTHESIA:  General.  ANESTHESIOLOGIST:  Jill Side, M.D.  ESTIMATED BLOOD LOSS:  Minimal.  PREPARATION:  ChloraPrep.  COMPLICATIONS:  None.  INDICATIONS:  The patient is a 50 year old black female who presented on referral from her primary care physician with symptomatic cholelithiasis.  The patient had noted a several month history of intermittent epigastric and right upper quadrant abdominal pain.  This had been associated with nausea and emesis.  The patient underwent abdominal ultrasound showing multiple small gallstones.  The patient now comes to Surgery for cholecystectomy.  BODY OF REPORT:  Procedures was done in OR #11 at the [facility].  The patient was brought to the operating room, placed in a supine position on the operating room table.  Following administration of general anesthesia, the patient was positioned and then prepped and draped in the usual strict aseptic fashion.  After ascertaining that an adequate level of anesthesia had been achieved, an infraumbilical incision was made through the previous scar just below the umbilicus.  Dissection was carried down to the fascia.  Fascia was incised in the midline and the peritoneal cavity was entered cautiously. Zero Vicryl purse-string suture was placed in the fascia.  An Hasson cannula was introduced under direct  vision and secured with a purse- string suture.  Abdomen was insufflated with carbon dioxide. Laparoscope was introduced and the abdomen explored.  Operative ports were placed along the right costal margin in the midline, midclavicular line, and anterior axillary line.  Fundus of the gallbladder was grasped and retracted cephalad.  Dissection was begun at the neck of the gallbladder.  Peritoneum was incised.  Cystic duct was dissected out along its length.  A clip was placed at the neck of the gallbladder. Cystic duct was incised and clear yellow bile emanates from the cystic duct.  A Cook cholangiography catheter was inserted through a stab wound in the right upper quadrant.  It was guided into the cystic duct and secured with a Ligaclip.  Using C-arm fluoroscopy, real-time cholangiography was performed.  There was rapid filling of a normal caliber common bile duct.  There was free flow distally into the duodenum.  There were no filling defects and no evidence of obstruction. There was reflux of contrast into the right and left hepatic ductal systems.  Clip was withdrawn and Cook catheter was removed from the peritoneal cavity.  Cystic duct was triply clipped and divided.  Cystic artery was dissected out, doubly clipped, and divided.  Gallbladder was then excised from the gallbladder bed using the hook electrocautery for hemostasis.  Gallbladder was completely excised from the liver.  It was placed  into an EndoCatch bag and withdrawn through the umbilical port without difficulty.  It contains multiple gallstones.  A 0-Vicryl purse-string suture was tied securely.  Right upper quadrant was inspected and irrigated.  Good hemostasis was noted.  Ports were removed under direct vision.  There was some bleeding from the midclavicular port in the right upper quadrant.  Using the hook electrocautery, the peritoneum and deep muscle layers were cauterized laparoscopically with cessation of  bleeding.  Ports were then removed and pneumoperitoneum was released.  Port sites were anesthetized with local anesthetic.  Wounds were closed with interrupted 4-0 Monocryl subcuticular sutures.  Wounds were washed and dried and benzoin and Steri-Strips were applied.  Sterile dressings were applied.  The patient was awakened from anesthesia and brought to the recovery room.  The patient tolerated the procedure well.   Velora Heckler, MD, FACS   TMG/MEDQ  D:  10/28/2011  T:  10/28/2011  Job:  [id number]  cc:   Jeannett Senior A. Clent Ridges, MD [address] [place] [address]

## 2011-10-28 NOTE — Anesthesia Postprocedure Evaluation (Signed)
  Anesthesia Post-op Note  Patient: [name]  Procedure(s) Performed:  LAPAROSCOPIC CHOLECYSTECTOMY WITH INTRAOPERATIVE CHOLANGIOGRAM  Patient Location: PACU  Anesthesia Type: General  Level of Consciousness: oriented and sedated  Airway and Oxygen Therapy: Patient Spontanous Breathing and Patient connected to nasal cannula oxygen  Post-op Pain: mild  Post-op Assessment: Post-op Vital signs reviewed, Patient's Cardiovascular Status Stable, Respiratory Function Stable, Patent Airway and No signs of Nausea or vomiting  Post-op Vital Signs: stable  Complications: No apparent anesthesia complications

## 2011-10-28 NOTE — Brief Op Note (Signed)
10/28/2011  10:51 AM  PATIENT:  [name]  51 y.o. female  PRE-OPERATIVE DIAGNOSIS:  symptomic cholelithiasis  POST-OPERATIVE DIAGNOSIS:  symptomic cholelithiasis  PROCEDURE:  Procedure(s): LAPAROSCOPIC CHOLECYSTECTOMY WITH INTRAOPERATIVE CHOLANGIOGRAM  SURGEON:  Surgeon(s): Velora Heckler, MD  ASSISTANTS: none   ANESTHESIA:   general  EBL:  Total I/O In: 1000 [I.V.:1000] Out: -   BLOOD ADMINISTERED:none  DRAINS: none   LOCAL MEDICATIONS USED:  MARCAINE 20 CC  SPECIMEN:  Excision  DISPOSITION OF SPECIMEN:  PATHOLOGY  COUNTS:  YES  DICTATION: .Other Dictation: Dictation Number [id number]  PLAN OF CARE: Admit for overnight observation  PATIENT DISPOSITION:  PACU - hemodynamically stable.   Velora Heckler, MD, FACS General & Endocrine Surgery [facility]

## 2011-10-29 MED ORDER — METOPROLOL TARTRATE 1 MG/ML IV SOLN
5.0000 mg | INTRAVENOUS | Status: DC
  Administered 2011-10-29: 5 mg via INTRAVENOUS
  Filled 2011-10-29 (×8): qty 5

## 2011-10-29 NOTE — Discharge Summary (Signed)
Physician Discharge Summary [facility]  Patient ID: DOMONIQUE COTHRAN MRN: [id number] DOB/AGE: August 14, 1961 51 y.o.  Admit date: 10/28/2011 Discharge date: 10/29/2011  Admission Diagnoses:  Symptomatic cholelithiasis  Discharge Diagnoses:  Active Problems:  * No active hospital problems. *    Discharged Condition: good  Hospital Course: Patient admitted after lap chole with IOC.  Post op course uncomplicated.  Good pain control.  Tolerated diet.  Prepared for discharge on POD#1.  Consults: None  Significant Diagnostic Studies: none  Treatments: surgery: lap chole with IOC  Discharge Exam: Blood pressure 148/82, pulse 69, temperature 97.9 F (36.6 C), temperature source Oral, resp. rate 17, height 5\' 7"  (1.702 m), weight 257 lb (116.574 kg), SpO2 100.00%. HEENT - clear Neck - soft, no mass Chest - clear Abd - soft without distension; dressings dry; mild tenderness  Disposition: Home with family  Discharge Orders    Future Appointments: Provider: Department: Dept Phone: Center:   11/12/2011 10:15 AM Velora Heckler, MD [facility] [phone] None     Future Orders Please Complete By Expires   Diet - low sodium heart healthy      Increase activity slowly      Discharge instructions      Comments:   [facility] LAPAROSCOPIC SURGERY: POST OP INSTRUCTIONS  Always review your discharge instruction sheet given to you by the facility where your surgery was performed.  A prescription for pain medication may be given to you upon discharge.  Take your pain medication as prescribed, if needed.  If narcotic pain medicine is not needed, then you may take acetaminophen (Tylenol) or ibuprofen (Advil) as needed. Take your usually prescribed medications unless otherwise directed. If you need a refill on your pain medication, please contact your pharmacy.  They will contact our office to request authorization. Prescriptions will not be filled after  5pm or on week-ends. You should follow a light diet the first few days after arrival home, such as soup and crackers, etc.  Be sure to include lots of fluids daily. Most patients will experience some swelling and bruising in the area of the incisions.  Ice packs will help.  Swelling and bruising can take several days to resolve.  It is common to experience some constipation if taking pain medication after surgery.  Increasing fluid intake and taking a stool softener (such as Colace) will usually help or prevent this problem from occurring.  A mild laxative (Milk of Magnesia or Miralax) should be taken according to package instructions if there are no bowel movements after 48 hours. Unless discharge instructions indicate otherwise, you may remove your bandages 24-48 hours after surgery, and you may shower at that time.  You may have steri-strips (small skin tapes) in place directly over the incision.  These strips should be left on the skin for 7-10 days.  If your surgeon used skin glue on the incision, you may shower in 24 hours.  The glue will flake off over the next 2-3 weeks.  Any sutures or staples will be removed at the office during your follow-up visit. ACTIVITIES:  You may resume regular (light) daily activities beginning the next day-such as daily self-care, walking, climbing stairs-gradually increasing activities as tolerated.  You may have sexual intercourse when it is comfortable.  Refrain from any heavy lifting or straining until approved by your doctor. You may drive when you are no longer taking prescription pain medication, you can comfortably wear a seatbelt, and you can  safely maneuver your car and apply brakes. You should see your doctor in the office for a follow-up appointment approximately 2-3 weeks after your surgery.  Make sure that you call for this appointment within a day or two after you arrive home to insure a convenient appointment time.  WHEN TO CALL YOUR DOCTOR: Fever over  101.0 Inability to urinate Continued bleeding from incision. Increased pain, redness, or drainage from the incision. Increasing abdominal pain  The clinic staff is available to answer your questions during regular business hours.  Please don't hesitate to call and ask to speak to one of the nurses for clinical concerns.  If you have a medical emergency, go to the nearest emergency room or call 911.  A surgeon from [facility] is always on call at the hospital. [phone] ? [phone] ? FAX [phone] Web site: [email or web address]   Remove dressing in 24 hours        Medication List  As of 10/29/2011  7:50 AM   TAKE these medications         amitriptyline 25 MG tablet   Commonly known as: ELAVIL   Take 25 mg by mouth at bedtime.      amLODipine 5 MG tablet   Commonly known as: NORVASC   Take 1 tablet (5 mg total) by mouth 2 (two) times daily.      methadone 10 MG tablet   Commonly known as: DOLOPHINE   Take 10 mg by mouth every 8 (eight) hours.      metoprolol 50 MG tablet   Commonly known as: LOPRESSOR   Take 1 tablet (50 mg total) by mouth 2 (two) times daily.      omeprazole 40 MG capsule   Commonly known as: PRILOSEC   Take 1 capsule (40 mg total) by mouth daily.      oxyCODONE 5 MG immediate release tablet   Commonly known as: Oxy IR/ROXICODONE   Take 5 mg by mouth. 1-2 every 3 hours as needed for pain           Velora Heckler, MD, FACS General & Endocrine Surgery [facility]   Signed: Velora Heckler 10/29/2011, 7:50 AM

## 2011-10-30 ENCOUNTER — Telehealth: Payer: Self-pay | Admitting: *Deleted

## 2011-10-30 NOTE — Telephone Encounter (Signed)
Left below information on voice mail, advised pt to call and let me know that she understands the new directions and to schedule the office visit.

## 2011-10-30 NOTE — Telephone Encounter (Signed)
Increase the Metoprolol to 2 tablets (total of 100 mg) bid and see me next week

## 2011-10-30 NOTE — Telephone Encounter (Signed)
Pt has GB surgery this week, and while she was in the hospital, and since she has returned home, her BP has been elevated.......178/93, 195/94, 170/106.  She is taking Metoprolol 50 mg bid, and Norvasc 5 mg. Bid.

## 2011-11-12 ENCOUNTER — Encounter (INDEPENDENT_AMBULATORY_CARE_PROVIDER_SITE_OTHER): Payer: Self-pay | Admitting: Surgery

## 2011-11-12 ENCOUNTER — Ambulatory Visit (INDEPENDENT_AMBULATORY_CARE_PROVIDER_SITE_OTHER): Payer: 59 | Admitting: Surgery

## 2011-11-12 DIAGNOSIS — K8010 Calculus of gallbladder with chronic cholecystitis without obstruction: Secondary | ICD-10-CM

## 2011-11-12 DIAGNOSIS — R109 Unspecified abdominal pain: Secondary | ICD-10-CM

## 2011-11-12 NOTE — Progress Notes (Signed)
Visit Diagnoses: 1. Abdominal pain   2. Cholelithiasis with cholecystitis     HISTORY: The patient returns for her first postoperative visit having undergone laparoscopic cholecystectomy with intraoperative cholangiography on October 28, 2011. Final pathology shows chronic cholecystitis and cholelithiasis. Postoperative course has been largely uneventful.  EXAM: Surgical incision healing nicely. No sign of infection. No sign of herniation. Right upper quadrant is soft and nontender without mass  IMPRESSION: Status post laparoscopic cholecystectomy with intraoperative cholangiography for chronic cholecystitis and cholelithiasis  PLAN: Patient will begin applying topical creams to her incisions. She is released to full activity except for heavy lifting. She will return to see me as needed.  Velora Heckler, MD, FACS General & Endocrine Surgery [facility]

## 2011-11-12 NOTE — Patient Instructions (Signed)
  COCOA BUTTER & VITAMIN E CREAM  ([employer] or other brand)  Apply cocoa butter/vitamin E cream to your incision 2 - 3 times daily.  Massage cream into incision for one minute with each application.  Use sunscreen (50 SPF or higher) for first 6 months after surgery if area is exposed to sun.  You may substitute Mederma or other scar reducing creams as desired.   

## 2011-11-20 ENCOUNTER — Encounter (HOSPITAL_COMMUNITY): Payer: Self-pay | Admitting: Surgery

## 2011-12-02 ENCOUNTER — Other Ambulatory Visit: Payer: Self-pay | Admitting: Family Medicine

## 2011-12-29 ENCOUNTER — Ambulatory Visit (INDEPENDENT_AMBULATORY_CARE_PROVIDER_SITE_OTHER): Payer: 59 | Admitting: Family Medicine

## 2011-12-29 ENCOUNTER — Encounter: Payer: Self-pay | Admitting: Family Medicine

## 2011-12-29 VITALS — BP 148/90 | HR 82 | Temp 98.4°F | Wt 270.0 lb

## 2011-12-29 DIAGNOSIS — R600 Localized edema: Secondary | ICD-10-CM

## 2011-12-29 DIAGNOSIS — R609 Edema, unspecified: Secondary | ICD-10-CM

## 2011-12-29 DIAGNOSIS — G4700 Insomnia, unspecified: Secondary | ICD-10-CM

## 2011-12-29 DIAGNOSIS — I10 Essential (primary) hypertension: Secondary | ICD-10-CM

## 2011-12-29 MED ORDER — FUROSEMIDE 40 MG PO TABS: 40.0000 mg | ORAL_TABLET | Freq: Every day | ORAL | Status: DC

## 2011-12-29 MED ORDER — METOPROLOL TARTRATE 50 MG PO TABS: 50.0000 mg | ORAL_TABLET | Freq: Two times a day (BID) | ORAL | Status: DC

## 2011-12-29 MED ORDER — TEMAZEPAM 30 MG PO CAPS: 30.0000 mg | ORAL_CAPSULE | Freq: Every evening | ORAL | Status: DC | PRN

## 2011-12-29 MED ORDER — AMLODIPINE BESYLATE 5 MG PO TABS: 5.0000 mg | ORAL_TABLET | Freq: Two times a day (BID) | ORAL | Status: DC

## 2011-12-29 NOTE — Progress Notes (Signed)
  Subjective:    Patient ID: [name], female    DOB: 07-14-1961, 51 y.o.   MRN: [id number]  HPI Here for increasing BP over the past few months and for leg and foot swelling. No chest pain or SOB. She had labs in January showing normal renal function. Also she has had trouble sleeping. She feels her general anxiety is well controlled during the daytime.    Review of Systems  Constitutional: Negative.   Respiratory: Negative.   Cardiovascular: Positive for leg swelling. Negative for chest pain and palpitations.       Objective:   Physical Exam  Constitutional: She appears well-developed and well-nourished.  Cardiovascular: Normal rate, regular rhythm, normal heart sounds and intact distal pulses.   Pulmonary/Chest: Effort normal and breath sounds normal.  Musculoskeletal:       2+ edema to both legs and feet  Lymphadenopathy:    She has no cervical adenopathy.          Assessment & Plan:  For the HTN and leg edema, we will add Lasix to her meds. Try Temazepam for sleep. Recheck in one month

## 2012-01-29 ENCOUNTER — Encounter (HOSPITAL_COMMUNITY): Payer: Self-pay | Admitting: Family Medicine

## 2012-01-29 ENCOUNTER — Emergency Department (HOSPITAL_COMMUNITY): Payer: 59

## 2012-01-29 ENCOUNTER — Inpatient Hospital Stay (HOSPITAL_COMMUNITY)
Admission: EM | Admit: 2012-01-29 | Discharge: 2012-01-31 | DRG: 149 | Disposition: A | Discharge status: Home / Self Care | Payer: 59 | Attending: Internal Medicine | Admitting: Internal Medicine

## 2012-01-29 DIAGNOSIS — I10 Essential (primary) hypertension: Secondary | ICD-10-CM

## 2012-01-29 DIAGNOSIS — R531 Weakness: Secondary | ICD-10-CM

## 2012-01-29 DIAGNOSIS — E876 Hypokalemia: Secondary | ICD-10-CM | POA: Diagnosis present

## 2012-01-29 DIAGNOSIS — R42 Dizziness and giddiness: Secondary | ICD-10-CM

## 2012-01-29 DIAGNOSIS — G4700 Insomnia, unspecified: Secondary | ICD-10-CM | POA: Diagnosis present

## 2012-01-29 DIAGNOSIS — I872 Venous insufficiency (chronic) (peripheral): Secondary | ICD-10-CM | POA: Diagnosis present

## 2012-01-29 DIAGNOSIS — F329 Major depressive disorder, single episode, unspecified: Secondary | ICD-10-CM

## 2012-01-29 DIAGNOSIS — M1990 Unspecified osteoarthritis, unspecified site: Secondary | ICD-10-CM | POA: Diagnosis present

## 2012-01-29 DIAGNOSIS — M129 Arthropathy, unspecified: Secondary | ICD-10-CM | POA: Diagnosis present

## 2012-01-29 DIAGNOSIS — Z96659 Presence of unspecified artificial knee joint: Secondary | ICD-10-CM

## 2012-01-29 DIAGNOSIS — T502X5A Adverse effect of carbonic-anhydrase inhibitors, benzothiadiazides and other diuretics, initial encounter: Secondary | ICD-10-CM | POA: Diagnosis present

## 2012-01-29 DIAGNOSIS — R609 Edema, unspecified: Secondary | ICD-10-CM | POA: Diagnosis present

## 2012-01-29 DIAGNOSIS — F17200 Nicotine dependence, unspecified, uncomplicated: Secondary | ICD-10-CM | POA: Diagnosis present

## 2012-01-29 DIAGNOSIS — G8929 Other chronic pain: Secondary | ICD-10-CM | POA: Diagnosis present

## 2012-01-29 DIAGNOSIS — K219 Gastro-esophageal reflux disease without esophagitis: Secondary | ICD-10-CM | POA: Diagnosis present

## 2012-01-29 LAB — COMPREHENSIVE METABOLIC PANEL
ALT: 17 U/L (ref 0–35)
AST: 19 U/L (ref 0–37)
Albumin: 4.4 g/dL (ref 3.5–5.2)
Alkaline Phosphatase: 114 U/L (ref 39–117)
BUN: 10 mg/dL (ref 6–23)
CO2: 27 mEq/L (ref 19–32)
Calcium: 9.3 mg/dL (ref 8.4–10.5)
Chloride: 101 mEq/L (ref 96–112)
Creatinine, Ser: 0.8 mg/dL (ref 0.50–1.10)
GFR calc Af Amer: >90 mL/min (ref >90)
GFR calc non Af Amer: 83 mL/min — ABNORMAL LOW (ref >90)
Glucose, Bld: 98 mg/dL (ref 70–99)
Potassium: 2.7 mEq/L — CL (ref 3.5–5.1)
Sodium: 140 mEq/L (ref 135–145)
Total Bilirubin: 0.2 mg/dL — ABNORMAL LOW (ref 0.3–1.2)
Total Protein: 7.7 g/dL (ref 6.0–8.3)

## 2012-01-29 LAB — DIFFERENTIAL
Basophils Absolute: 0 10*3/uL (ref 0.0–0.1)
Basophils Relative: 0 % (ref 0–1)
Eosinophils Absolute: 0.2 10*3/uL (ref 0.0–0.7)
Eosinophils Relative: 2 % (ref 0–5)
Lymphocytes Relative: 44 % (ref 12–46)
Lymphs Abs: 3.5 10*3/uL (ref 0.7–4.0)
Monocytes Absolute: 0.4 10*3/uL (ref 0.1–1.0)
Monocytes Relative: 5 % (ref 3–12)
Neutro Abs: 3.9 10*3/uL (ref 1.7–7.7)
Neutrophils Relative %: 49 % (ref 43–77)

## 2012-01-29 LAB — CBC
HCT: 42.3 % (ref 36.0–46.0)
Hemoglobin: 14.4 g/dL (ref 12.0–15.0)
MCH: 29.8 pg (ref 26.0–34.0)
MCHC: 34 g/dL (ref 30.0–36.0)
MCV: 87.4 fL (ref 78.0–100.0)
Platelets: 324 10*3/uL (ref 150–400)
RBC: 4.84 MIL/uL (ref 3.87–5.11)
RDW: 14.1 % (ref 11.5–15.5)
WBC: 8.1 10*3/uL (ref 4.0–10.5)

## 2012-01-29 LAB — TROPONIN I: Troponin I: <0.3 ng/mL (ref <0.30)

## 2012-01-29 LAB — GLUCOSE, CAPILLARY: Glucose-Capillary: 103 mg/dL — ABNORMAL HIGH (ref 70–99)

## 2012-01-29 LAB — MAGNESIUM: Magnesium: 2.3 mg/dL (ref 1.5–2.5)

## 2012-01-29 LAB — PROTIME-INR
INR: 0.9 (ref 0.00–1.49)
Prothrombin Time: 12.3 seconds (ref 11.6–15.2)

## 2012-01-29 LAB — APTT: aPTT: 36 seconds (ref 24–37)

## 2012-01-29 MED ORDER — MECLIZINE HCL 25 MG PO TABS
25.0000 mg | ORAL_TABLET | Freq: Once | ORAL | Status: AC
  Administered 2012-01-29: 25 mg via ORAL
  Filled 2012-01-29: qty 1

## 2012-01-29 MED ORDER — POTASSIUM CHLORIDE 10 MEQ/100ML IV SOLN
10.0000 meq | Freq: Once | INTRAVENOUS | Status: AC
  Administered 2012-01-29: 10 meq via INTRAVENOUS
  Filled 2012-01-29 (×2): qty 100

## 2012-01-29 MED ORDER — FUROSEMIDE 40 MG PO TABS: 40.0000 mg | ORAL_TABLET | Freq: Every day | ORAL | Status: DC

## 2012-01-29 MED ORDER — BUTALBITAL-APAP-CAFFEINE 50-325-40 MG PO TABS
1.0000 | ORAL_TABLET | Freq: Four times a day (QID) | ORAL | Status: DC | PRN
  Administered 2012-01-29 – 2012-01-30 (×2): 1 via ORAL
  Filled 2012-01-29 (×2): qty 1

## 2012-01-29 MED ORDER — ONDANSETRON HCL 4 MG/2ML IJ SOLN
4.0000 mg | Freq: Once | INTRAMUSCULAR | Status: AC
  Administered 2012-01-29: 4 mg via INTRAVENOUS
  Filled 2012-01-29: qty 2

## 2012-01-29 MED ORDER — POTASSIUM CHLORIDE CRYS ER 20 MEQ PO TBCR
40.0000 meq | EXTENDED_RELEASE_TABLET | Freq: Once | ORAL | Status: AC
  Administered 2012-01-29: 40 meq via ORAL
  Filled 2012-01-29: qty 2

## 2012-01-29 NOTE — ED Notes (Signed)
Patient transported to MRI 

## 2012-01-29 NOTE — ED Provider Notes (Signed)
History     CSN: [id number]  Arrival date & time 01/29/12  1914   First MD Initiated Contact with Patient 01/29/12 2000      Chief Complaint  Patient presents with  . Near Syncope    HPI This afternoon she began feeling dizzy and lightheaded.  Pt feels like the room is spinning.  She felt like she was going to fall to the left.  She also feels like her right arm went numb and both legs are weak.  Pt denies any trouble with her speech.  NO falls.    No dropping of objects.  No headache.  These symptoms around 12pm today.  She denies fevers or chills.  She has been having some hot flashes.  The symptoms are better when she is still.  It gets worse with movement. Past Medical History  Diagnosis Date  . Osteoarthritis   . Headache     migraine like per patient  . Hypertension   . Low back pain     dr phillips, pain management  . Degenerative joint disease   . Gallstones   . GERD (gastroesophageal reflux disease)     Past Surgical History  Procedure Date  . Replacement total knee     right - dr Margreta Journey  . Ganglion cyst excision     right wrist  . Lipoma excision 02/21/08    right hip, [facility]  . Replacement total knee 06/25/09    left - dr Merrilee Seashore [facility]  . Tonsillectomy   . Joint replacement     both knees have been done, four times each per patient. Her body rejects the implant, no infection.  . Abdominal hysterectomy 06/22/06    lap, dr Su Hilt  . Other surgical history     lipoma removed from back 2012   . Cholecystectomy 10/28/11  . Cholecystectomy 10/28/2011    Procedure: LAPAROSCOPIC CHOLECYSTECTOMY WITH INTRAOPERATIVE CHOLANGIOGRAM;  Surgeon: Velora Heckler, MD;  Location: [facility];  Service: General;  Laterality: N/A;    Family History  Problem Relation Age of Onset  . Crohn's disease Other     nephew  . Cancer Mother     stomach  . Heart disease Father   . Cancer Sister     kidney - one out of the four  . Hypertension Sister   . Hypertension Brother     History    Substance Use Topics  . Smoking status: Current Everyday Smoker -- 0.2 packs/day for 20 years    Types: Cigarettes  . Smokeless tobacco: Never Used  . Alcohol Use: No    OB History    Grav Para Term Preterm Abortions TAB SAB Ect Mult Living                  Review of Systems  All other systems reviewed and are negative.    Allergies  Codeine; Sulfonamide derivatives; and Latex  Home Medications   Current Outpatient Rx  Name Route Sig Dispense Refill  . AMITRIPTYLINE HCL 25 MG PO TABS Oral Take 25 mg by mouth at bedtime.     Marland Kitchen AMLODIPINE BESYLATE 5 MG PO TABS Oral Take 1 tablet (5 mg total) by mouth 2 (two) times daily. 60 tablet 11  . FUROSEMIDE 40 MG PO TABS Oral Take 1 tablet (40 mg total) by mouth daily. 30 tablet 11  . METHADONE HCL 10 MG PO TABS Oral Take 10 mg by mouth every 8 (eight) hours.     Marland Kitchen  METOPROLOL TARTRATE 50 MG PO TABS Oral Take 1 tablet (50 mg total) by mouth 2 (two) times daily. 60 tablet 11  . OMEPRAZOLE 40 MG PO CPDR  TAKE ONE CAPSULE BY MOUTH EVERY DAY 30 capsule 10  . OXYCODONE HCL 15 MG PO TABS Oral Take 15 mg by mouth every 4 (four) hours as needed.    . OXYCODONE HCL 5 MG PO TABS Oral Take 5 mg by mouth. 1-2 every 3 hours as needed for pain    . TEMAZEPAM 30 MG PO CAPS Oral Take 30 mg by mouth at bedtime as needed. Needed for sleep    . TEMAZEPAM 30 MG PO CAPS Oral Take 1 capsule (30 mg total) by mouth at bedtime as needed for sleep. 30 capsule 5    BP 175/96  Pulse 81  Temp(Src) 98.3 F (36.8 C) (Oral)  Resp 22  Ht 5\' 7"  (1.702 m)  Wt 260 lb (117.935 kg)  BMI 40.72 kg/m2  SpO2 98%  Physical Exam  Nursing note and vitals reviewed. Constitutional: She is oriented to person, place, and time. She appears well-developed and well-nourished. No distress.       Obese   HENT:  Head: Normocephalic and atraumatic.  Right Ear: External ear normal.  Left Ear: External ear normal.  Mouth/Throat: Oropharynx is clear and moist.  Eyes:  Conjunctivae are normal. Right eye exhibits no discharge. Left eye exhibits no discharge. No scleral icterus.  Neck: Neck supple. No tracheal deviation present.  Cardiovascular: Normal rate, regular rhythm and intact distal pulses.   Pulmonary/Chest: Effort normal and breath sounds normal. No stridor. No respiratory distress. She has no wheezes. She has no rales.  Abdominal: Soft. Bowel sounds are normal. She exhibits no distension. There is no tenderness. There is no rebound and no guarding.  Musculoskeletal: She exhibits no edema and no tenderness.  Neurological: She is alert and oriented to person, place, and time. She has normal strength. No sensory deficit. Cranial nerve deficit:  no gross defecits noted. She exhibits normal muscle tone. She displays no seizure activity. Coordination normal.       No pronator drift bilateral upper extrem, able to hold both legs off bed for 5 seconds, sensation intact in all extremities, no visual field cuts, no left or right sided neglect  Skin: Skin is warm and dry. No rash noted.  Psychiatric: She has a normal mood and affect.    ED Course  Procedures (including critical care time)  Rate: 73  Rhythm: normal sinus rhythm  QRS Axis: normal  Intervals: normal  ST/T Wave abnormalities: normal  Conduction Disutrbances:none  Narrative Interpretation: Borderline repolarization abnormality  Old EKG Reviewed: Unchanged  Labs Reviewed  COMPREHENSIVE METABOLIC PANEL - Abnormal; Notable for the following:    Potassium 2.7 (*)    Total Bilirubin 0.2 (*)    GFR calc non Af Amer 83 (*)    All other components within normal limits  GLUCOSE, CAPILLARY - Abnormal; Notable for the following:    Glucose-Capillary 103 (*)    All other components within normal limits  PROTIME-INR  APTT  CBC  DIFFERENTIAL  TROPONIN I   Mr Brain Wo Contrast  01/29/2012  *RADIOLOGY REPORT*  Clinical Data: Syncope and dizziness over the last 7 hours.  MRI HEAD WITHOUT CONTRAST   Technique:  Multiplanar, multiecho pulse sequences of the brain and surrounding structures were obtained according to standard protocol without intravenous contrast.  Comparison: CT head without contrast 07/03/2010.  Findings: The diffusion  weighted images demonstrate no evidence for acute or subacute infarction.  A relatively empty sella is present, unlikely to be of clinical consequence to the patient.  No acute hemorrhage or mass lesion is present.  Minimal white matter changes are likely within normal limits for age.  The maxillary sinuses are shrunken bilaterally.  The remaining paranasal sinuses and mastoid air cells are clear.  Flow is present in the major intracranial arteries.  The globes and orbits are intact.  IMPRESSION:  1.  Normal MRI appearance of the brain. 2.  Shrunken maxillary sinuses bilaterally, suggesting a history of chronic disease.  Original Report Authenticated By: Jamesetta Orleans. MATTERN, M.D.    1. Hypokalemia   2. Weakness     MDM  Pt with weakness and hypokalemia.  No focal deficits on neuro exam.  Symptoms suggestive of vertigo as well but would treat her hypokalemia and see if her symptoms resolve.  With her significantly decreased level will admit for IV and oral potassium replacement.        Celene Kras, MD 01/29/12 2240

## 2012-01-29 NOTE — ED Notes (Signed)
Patient states that around noon she got dizzy and almost passed out. States that right arm went numb. States she layed down and when she woke she had the same feeling. Describes as "my arm feels like it is sleep." Now both legs feel weak. Denies changes in speech.

## 2012-01-29 NOTE — H&P (Addendum)
PCP:  Nelwyn Salisbury, MD, MD   DOA:  01/29/2012  7:41 PM  Chief Complaint:  lightheadedness  HPI: 51 year old female with multiple co morbidities including but not limited to HTN, headaches, hypokalemia, neuropathy who presented to ED with complaints of feeling lightheaded and dizzy which started on the day of admission without losing consciousness. Patient reports no associated chest pain, no shortness of breath, no cough, no fever or chills, no abdominal pain, no nausea or vomiting. Patient does report she gets better when she lays down and as soon as she tries to get up she starts feeling dizzy.  Allergies: Allergies  Allergen Reactions  . Codeine Itching and Nausea And Vomiting    Itching all over the body  . Sulfonamide Derivatives Hives    All over the body  . Latex Hives    Prior to Admission medications   Medication Sig Start Date End Date Taking? Authorizing Provider  amitriptyline (ELAVIL) 25 MG tablet Take 25 mg by mouth at bedtime.    Yes Historical Provider, MD  amLODipine (NORVASC) 5 MG tablet Take 1 tablet (5 mg total) by mouth 2 (two) times daily. 12/29/11  Yes Nelwyn Salisbury, MD  furosemide (LASIX) 40 MG tablet Take 1 tablet (40 mg total) by mouth daily. 12/29/11 12/28/12 Yes Nelwyn Salisbury, MD  methadone (DOLOPHINE) 10 MG tablet Take 10 mg by mouth every 8 (eight) hours.    Yes Historical Provider, MD  metoprolol (LOPRESSOR) 50 MG tablet Take 1 tablet (50 mg total) by mouth 2 (two) times daily. 12/29/11  Yes Nelwyn Salisbury, MD  omeprazole (PRILOSEC) 40 MG capsule TAKE ONE CAPSULE BY MOUTH EVERY DAY 12/02/11  Yes Nelwyn Salisbury, MD  oxyCODONE (ROXICODONE) 15 MG immediate release tablet Take 15 mg by mouth every 4 (four) hours as needed.   Yes Historical Provider, MD  oxyCODONE (ROXICODONE) 5 MG immediate release tablet Take 5 mg by mouth. 1-2 every 3 hours as needed for pain   Yes Historical Provider, MD  temazepam (RESTORIL) 30 MG capsule Take 30 mg by mouth at bedtime as needed.  Needed for sleep   Yes Historical Provider, MD  temazepam (RESTORIL) 30 MG capsule Take 1 capsule (30 mg total) by mouth at bedtime as needed for sleep. 12/29/11 01/28/12  Nelwyn Salisbury, MD    Past Medical History  Diagnosis Date  . Osteoarthritis   . Headache     migraine like per patient  . Hypertension   . Low back pain     dr phillips, pain management  . Degenerative joint disease   . Gallstones   . GERD (gastroesophageal reflux disease)     Past Surgical History  Procedure Date  . Replacement total knee     right - dr Margreta Journey  . Ganglion cyst excision     right wrist  . Lipoma excision 02/21/08    right hip, [facility]  . Replacement total knee 06/25/09    left - dr Merrilee Seashore [facility]  . Tonsillectomy   . Joint replacement     both knees have been done, four times each per patient. Her body rejects the implant, no infection.  . Abdominal hysterectomy 06/22/06    lap, dr Su Hilt  . Other surgical history     lipoma removed from back 2012   . Cholecystectomy 10/28/11  . Cholecystectomy 10/28/2011    Procedure: LAPAROSCOPIC CHOLECYSTECTOMY WITH INTRAOPERATIVE CHOLANGIOGRAM;  Surgeon: Velora Heckler, MD;  Location: [facility];  Service: General;  Laterality: N/A;    Social History:  reports that she has been smoking Cigarettes.  She has a 5 pack-year smoking history. She has never used smokeless tobacco. She reports that she does not drink alcohol or use illicit drugs.  Family History  Problem Relation Age of Onset  . Crohn's disease Other     nephew  . Cancer Mother     stomach  . Heart disease Father   . Cancer Sister     kidney - one out of the four  . Hypertension Sister   . Hypertension Brother     Review of Systems:  Constitutional: Denies fever, chills, diaphoresis, appetite change and fatigue.  HEENT: Denies photophobia, eye pain, redness, hearing loss, ear pain, congestion, sore throat, rhinorrhea, sneezing, mouth sores, trouble swallowing, neck pain, neck stiffness and  tinnitus.   Respiratory: Denies SOB, DOE, cough, chest tightness,  and wheezing.   Cardiovascular: Denies chest pain, palpitations and leg swelling.  Gastrointestinal: Denies nausea, vomiting, abdominal pain, diarrhea, constipation, blood in stool and abdominal distention.  Genitourinary: Denies dysuria, urgency, frequency, hematuria, flank pain and difficulty urinating.  Musculoskeletal: Denies myalgias, back pain, joint swelling, arthralgias and gait problem.  Skin: Denies pallor, rash and wound.  Neurological: per HPI Hematological: Denies adenopathy. Easy bruising, personal or family bleeding history  Psychiatric/Behavioral: Denies suicidal ideation, mood changes, confusion, nervousness, sleep disturbance and agitation   Physical Exam:  Filed Vitals:   01/29/12 2145 01/29/12 2200 01/29/12 2215 01/29/12 2230  BP: 161/94 161/100 167/121 176/90  Pulse: 71 68 73 78  Temp:      TempSrc:      Resp:      Height:      Weight:      SpO2: 97% 97% 96% 96%    Constitutional: Vital signs reviewed.  Patient is in no acute distress and cooperative with exam. Alert and oriented x3.  Head: Normocephalic and atraumatic Ear: TM normal bilaterally Mouth: no erythema or exudates, MMM Eyes: PERRL, EOMI, conjunctivae normal, No scleral icterus.  Neck: Supple, Trachea midline normal ROM, No JVD, mass, thyromegaly, or carotid bruit present.  Cardiovascular: RRR, S1 normal, S2 normal, no MRG, pulses symmetric and intact bilaterally Pulmonary/Chest: CTAB, no wheezes, rales, or rhonchi Abdominal: Soft. Non-tender, non-distended, bowel sounds are normal, no masses, organomegaly, or guarding present.  GU: no CVA tenderness Musculoskeletal: No joint deformities, erythema, or stiffness, ROM full and no nontender Ext: (+1) LE edema but no cyanosis, pulses palpable bilaterally (DP and PT) Hematology: no cervical, inginal, or axillary adenopathy.  Neurological: A&O x3, Strenght is normal and symmetric  bilaterally, cranial nerve II-XII are grossly intact, no focal motor deficit, sensory intact to light touch bilaterally.  Skin: Warm, dry and intact. No rash, cyanosis, or clubbing.  Psychiatric: Normal mood and affect. speech and behavior is normal. Judgment and thought content normal. Cognition and memory are normal.   Labs on Admission:  Results for orders placed during the hospital encounter of 01/29/12 (from the past 48 hour(s))  PROTIME-INR     Status: Normal   Collection Time   01/29/12  8:15 PM      Component Value Range Comment   Prothrombin Time 12.3  11.6 - 15.2 (seconds)    INR 0.90  0.00 - 1.49    APTT     Status: Normal   Collection Time   01/29/12  8:15 PM      Component Value Range Comment   aPTT 36  24 - 37 (seconds)  CBC     Status: Normal   Collection Time   01/29/12  8:15 PM      Component Value Range Comment   WBC 8.1  4.0 - 10.5 (K/uL)    RBC 4.84  3.87 - 5.11 (MIL/uL)    Hemoglobin 14.4  12.0 - 15.0 (g/dL)    HCT 45.4  09.8 - 11.9 (%)    MCV 87.4  78.0 - 100.0 (fL)    MCH 29.8  26.0 - 34.0 (pg)    MCHC 34.0  30.0 - 36.0 (g/dL)    RDW 14.7  82.9 - 56.2 (%)    Platelets 324  150 - 400 (K/uL)   DIFFERENTIAL     Status: Normal   Collection Time   01/29/12  8:15 PM      Component Value Range Comment   Neutrophils Relative 49  43 - 77 (%)    Neutro Abs 3.9  1.7 - 7.7 (K/uL)    Lymphocytes Relative 44  12 - 46 (%)    Lymphs Abs 3.5  0.7 - 4.0 (K/uL)    Monocytes Relative 5  3 - 12 (%)    Monocytes Absolute 0.4  0.1 - 1.0 (K/uL)    Eosinophils Relative 2  0 - 5 (%)    Eosinophils Absolute 0.2  0.0 - 0.7 (K/uL)    Basophils Relative 0  0 - 1 (%)    Basophils Absolute 0.0  0.0 - 0.1 (K/uL)   COMPREHENSIVE METABOLIC PANEL     Status: Abnormal   Collection Time   01/29/12  8:15 PM      Component Value Range Comment   Sodium 140  135 - 145 (mEq/L)    Potassium 2.7 (*) 3.5 - 5.1 (mEq/L)    Chloride 101  96 - 112 (mEq/L)    CO2 27  19 - 32 (mEq/L)    Glucose, Bld 98   70 - 99 (mg/dL)    BUN 10  6 - 23 (mg/dL)    Creatinine, Ser 1.30  0.50 - 1.10 (mg/dL)    Calcium 9.3  8.4 - 10.5 (mg/dL)    Total Protein 7.7  6.0 - 8.3 (g/dL)    Albumin 4.4  3.5 - 5.2 (g/dL)    AST 19  0 - 37 (U/L)    ALT 17  0 - 35 (U/L)    Alkaline Phosphatase 114  39 - 117 (U/L)    Total Bilirubin 0.2 (*) 0.3 - 1.2 (mg/dL)    GFR calc non Af Amer 83 (*) >90 (mL/min)    GFR calc Af Amer >90  >90 (mL/min)   TROPONIN I     Status: Normal   Collection Time   01/29/12  8:15 PM      Component Value Range Comment   Troponin I <0.30  <0.30 (ng/mL)   GLUCOSE, CAPILLARY     Status: Abnormal   Collection Time   01/29/12  8:23 PM      Component Value Range Comment   Glucose-Capillary 103 (*) 70 - 99 (mg/dL)    Comment 1 Notify RN       Radiological Exams on Admission:  MRI Brain without contrast: IMPRESSION:  1. Normal MRI appearance of the brain.  2. Shrunken maxillary sinuses bilaterally, suggesting a history of chronic disease.  Assessment/Plan  Principal Problem:   *Hypokalemia - perhaps secondary to lasix given for lower extremity edema - repleted potassium in ED - hold lasix at this time - follow up  BMP in am  Active Problems:   HYPERTENSION - BP elevated at SBP170's - start home medications which include norvasc, metoprolol  Lower extremity edema - mild - will check 2 D ECHO to rule out CHF - hold lasix at this time   GERD - continue protonix 40 mg daily   Lightheadedness - unclear etiology but perhaps due to multiple pain medications pt  is taking for back pain including oxycodone and methadone - MRI brain done in ED with normal findings - we will start meclizine 12.5 mg BID - cycle cardiac enzymes (1st set of cardiac enzymes is negative) - obtain TSH level - PT evaluation  Arthritis - patient takes methadone for pain - per patient she follows with pain management - continue oxycodone for breakthrough pain  DVT Prophylaxis - lovenox subQ  Code  Status - full code  Disposition - admit to telemetry  Education  - test results and diagnostic studies were discussed with patient  - patient verbalized the understanding - questions were answered at the bedside and contact information was provided for additional questions or concerns  Time Spent on Admission: Over 30 minutes  Jackelyn Illingworth 01/29/2012, 10:55 PM  [facility] Pager # [phone] Main Office # [phone]

## 2012-01-30 DIAGNOSIS — I10 Essential (primary) hypertension: Secondary | ICD-10-CM

## 2012-01-30 DIAGNOSIS — R42 Dizziness and giddiness: Secondary | ICD-10-CM

## 2012-01-30 DIAGNOSIS — E876 Hypokalemia: Secondary | ICD-10-CM

## 2012-01-30 DIAGNOSIS — F3289 Other specified depressive episodes: Secondary | ICD-10-CM

## 2012-01-30 DIAGNOSIS — F329 Major depressive disorder, single episode, unspecified: Secondary | ICD-10-CM

## 2012-01-30 LAB — CARDIAC PANEL(CRET KIN+CKTOT+MB+TROPI)
CK, MB: 2 ng/mL (ref 0.3–4.0)
CK, MB: 2.1 ng/mL (ref 0.3–4.0)
CK, MB: 2 ng/mL (ref 0.3–4.0)
Relative Index: 0.8 (ref 0.0–2.5)
Relative Index: 0.9 (ref 0.0–2.5)
Relative Index: 0.9 (ref 0.0–2.5)
Total CK: 241 U/L — ABNORMAL HIGH (ref 7–177)
Total CK: 229 U/L — ABNORMAL HIGH (ref 7–177)
Total CK: 234 U/L — ABNORMAL HIGH (ref 7–177)
Troponin I: <0.3 ng/mL (ref <0.30)
Troponin I: <0.3 ng/mL (ref <0.30)
Troponin I: <0.3 ng/mL (ref <0.30)

## 2012-01-30 LAB — COMPREHENSIVE METABOLIC PANEL
ALT: 12 U/L (ref 0–35)
AST: 20 U/L (ref 0–37)
Albumin: 3.9 g/dL (ref 3.5–5.2)
Alkaline Phosphatase: 100 U/L (ref 39–117)
BUN: 9 mg/dL (ref 6–23)
CO2: 27 mEq/L (ref 19–32)
Calcium: 9.2 mg/dL (ref 8.4–10.5)
Chloride: 102 mEq/L (ref 96–112)
Creatinine, Ser: 0.77 mg/dL (ref 0.50–1.10)
GFR calc Af Amer: >90 mL/min (ref >90)
GFR calc non Af Amer: >90 mL/min (ref >90)
Glucose, Bld: 166 mg/dL — ABNORMAL HIGH (ref 70–99)
Potassium: 3 mEq/L — ABNORMAL LOW (ref 3.5–5.1)
Sodium: 140 mEq/L (ref 135–145)
Total Bilirubin: 0.2 mg/dL — ABNORMAL LOW (ref 0.3–1.2)
Total Protein: 6.8 g/dL (ref 6.0–8.3)

## 2012-01-30 LAB — DIFFERENTIAL
Basophils Absolute: 0 10*3/uL (ref 0.0–0.1)
Basophils Relative: 0 % (ref 0–1)
Eosinophils Absolute: 0.2 10*3/uL (ref 0.0–0.7)
Eosinophils Relative: 2 % (ref 0–5)
Lymphocytes Relative: 46 % (ref 12–46)
Lymphs Abs: 3.7 10*3/uL (ref 0.7–4.0)
Monocytes Absolute: 0.4 10*3/uL (ref 0.1–1.0)
Monocytes Relative: 5 % (ref 3–12)
Neutro Abs: 3.8 10*3/uL (ref 1.7–7.7)
Neutrophils Relative %: 47 % (ref 43–77)

## 2012-01-30 LAB — GLUCOSE, CAPILLARY: Glucose-Capillary: 140 mg/dL — ABNORMAL HIGH (ref 70–99)

## 2012-01-30 LAB — CBC
HCT: 39.3 % (ref 36.0–46.0)
Hemoglobin: 13.1 g/dL (ref 12.0–15.0)
MCH: 29.6 pg (ref 26.0–34.0)
MCHC: 33.3 g/dL (ref 30.0–36.0)
MCV: 88.7 fL (ref 78.0–100.0)
Platelets: 339 10*3/uL (ref 150–400)
RBC: 4.43 MIL/uL (ref 3.87–5.11)
RDW: 14.3 % (ref 11.5–15.5)
WBC: 8.1 10*3/uL (ref 4.0–10.5)

## 2012-01-30 LAB — MAGNESIUM: Magnesium: 2.1 mg/dL (ref 1.5–2.5)

## 2012-01-30 LAB — TSH: TSH: 3.159 u[IU]/mL (ref 0.350–4.500)

## 2012-01-30 LAB — HEMOGLOBIN A1C
Hgb A1c MFr Bld: 6.5 % — ABNORMAL HIGH (ref <5.7)
Mean Plasma Glucose: 140 mg/dL — ABNORMAL HIGH (ref <117)

## 2012-01-30 LAB — PHOSPHORUS: Phosphorus: 4.4 mg/dL (ref 2.3–4.6)

## 2012-01-30 LAB — PRO B NATRIURETIC PEPTIDE: Pro B Natriuretic peptide (BNP): 16.1 pg/mL (ref 0–125)

## 2012-01-30 MED ORDER — POTASSIUM CHLORIDE CRYS ER 20 MEQ PO TBCR
40.0000 meq | EXTENDED_RELEASE_TABLET | ORAL | Status: AC
  Administered 2012-01-30 (×3): 40 meq via ORAL
  Filled 2012-01-30 (×3): qty 2

## 2012-01-30 MED ORDER — PANTOPRAZOLE SODIUM 40 MG PO TBEC
40.0000 mg | DELAYED_RELEASE_TABLET | Freq: Every day | ORAL | Status: DC
  Administered 2012-01-30 – 2012-01-31 (×2): 40 mg via ORAL
  Filled 2012-01-30 (×2): qty 1

## 2012-01-30 MED ORDER — MECLIZINE HCL 12.5 MG PO TABS
12.5000 mg | ORAL_TABLET | Freq: Two times a day (BID) | ORAL | Status: DC
  Administered 2012-01-30 – 2012-01-31 (×3): 12.5 mg via ORAL
  Filled 2012-01-30 (×5): qty 1

## 2012-01-30 MED ORDER — AMLODIPINE BESYLATE 5 MG PO TABS
5.0000 mg | ORAL_TABLET | Freq: Two times a day (BID) | ORAL | Status: DC
  Administered 2012-01-30 – 2012-01-31 (×4): 5 mg via ORAL
  Filled 2012-01-30 (×5): qty 1

## 2012-01-30 MED ORDER — METOPROLOL TARTRATE 50 MG PO TABS
50.0000 mg | ORAL_TABLET | Freq: Two times a day (BID) | ORAL | Status: DC
  Administered 2012-01-30 – 2012-01-31 (×4): 50 mg via ORAL
  Filled 2012-01-30 (×5): qty 1

## 2012-01-30 MED ORDER — TEMAZEPAM 15 MG PO CAPS: 30.0000 mg | ORAL_CAPSULE | Freq: Every evening | ORAL | Status: DC | PRN

## 2012-01-30 MED ORDER — AMITRIPTYLINE HCL 25 MG PO TABS
25.0000 mg | ORAL_TABLET | Freq: Every day | ORAL | Status: DC
  Administered 2012-01-30: 25 mg via ORAL
  Filled 2012-01-30 (×2): qty 1

## 2012-01-30 MED ORDER — OXYCODONE HCL 5 MG PO TABS: 15.0000 mg | ORAL_TABLET | ORAL | Status: DC | PRN

## 2012-01-30 MED ORDER — ONDANSETRON HCL 4 MG/2ML IJ SOLN: 4.0000 mg | Freq: Four times a day (QID) | INTRAMUSCULAR | Status: DC | PRN

## 2012-01-30 MED ORDER — POTASSIUM CHLORIDE CRYS ER 20 MEQ PO TBCR
40.0000 meq | EXTENDED_RELEASE_TABLET | Freq: Two times a day (BID) | ORAL | Status: DC
  Filled 2012-01-30 (×2): qty 2

## 2012-01-30 MED ORDER — METHADONE HCL 10 MG PO TABS
10.0000 mg | ORAL_TABLET | Freq: Three times a day (TID) | ORAL | Status: DC
  Administered 2012-01-30 – 2012-01-31 (×5): 10 mg via ORAL
  Filled 2012-01-30 (×5): qty 1

## 2012-01-30 MED ORDER — SODIUM CHLORIDE 0.9 % IJ SOLN
3.0000 mL | Freq: Two times a day (BID) | INTRAMUSCULAR | Status: DC
  Administered 2012-01-30: 3 mL via INTRAVENOUS

## 2012-01-30 MED ORDER — ONDANSETRON HCL 4 MG PO TABS
4.0000 mg | ORAL_TABLET | Freq: Four times a day (QID) | ORAL | Status: DC | PRN
  Administered 2012-01-30 – 2012-01-31 (×3): 4 mg via ORAL
  Filled 2012-01-30 (×3): qty 1

## 2012-01-30 MED ORDER — ENOXAPARIN SODIUM 30 MG/0.3ML ~~LOC~~ SOLN
30.0000 mg | SUBCUTANEOUS | Status: DC
  Administered 2012-01-30 – 2012-01-31 (×2): 30 mg via SUBCUTANEOUS
  Filled 2012-01-30 (×2): qty 0.3

## 2012-01-30 NOTE — Progress Notes (Deleted)
*  PRELIMINARY RESULTS* Echocardiogram 2D Echocardiogram has been performed.  [name] 01/30/2012, 3:51 PM

## 2012-01-30 NOTE — Progress Notes (Signed)
   CARE MANAGEMENT NOTE 01/30/2012  Patient:  [name], [name]   Account Number:  [id number]  Date Initiated:  01/30/2012  Documentation initiated by:  Jiles Crocker  Subjective/Objective Assessment:   ADMITTED WITH LIGHTHEADEDNESS, HYPOKALEMIA     Action/Plan:   PCP IS DR Clent Ridges; LIVES WITH SPOUSE   Anticipated DC Date:  02/02/2012   Anticipated DC Plan:  HOME/SELF CARE           Status of service:  In process, will continue to follow Medicare Important Message given?  NA - LOS <3 / Initial given by admissions (If response is "NO", the following Medicare IM given date fields will be blank)  Discharge Disposition:  HOME  Per UR Regulation:  Reviewed for med. necessity/level of care/duration of stay  Comments:  01/30/2012- B Allyiah Gartner RN, BSN, MHA

## 2012-01-30 NOTE — Evaluation (Signed)
Physical Therapy Evaluation Patient Details Name: [name] MRN: [id number] DOB: 18-Jul-1961 Today's Date: 01/30/2012 Time: 1002-1018 PT Time Calculation (min): 16 min  PT Assessment / Plan / Recommendation Clinical Impression  Pt presents with diagnosis of hypokalemia and dizziness. OT/PT vestibular eval has been ordered as well. Informed pt that eval will likely be performed later this afternoon or in the am (vestibular therapist will be available then). Encouraged pt to have assist for any OOB activities.     PT Assessment  Patient needs continued PT services    Follow Up Recommendations   (to be determined)    Barriers to Discharge        lEquipment Recommendations  None recommended by PT    Recommendations for Other Services     Frequency Min 3X/week    Precautions / Restrictions Precautions Precautions: Fall Restrictions Weight Bearing Restrictions: No   Pertinent Vitals/Pain Orthostatics: supine-143/87, sitting-156/91, standing-147/90      Mobility  Bed Mobility Bed Mobility: Right Sidelying to Sit;Sit to Supine Right Sidelying to Sit: 6: Modified independent (Device/Increase time) Details for Bed Mobility Assistance: Recommended slow movements to avoid increase in symptoms (dizzines, nausea). Pt did c/o dizziness and nausea once in sitting position after ~ 1-2 minutes of sitting Transfers Transfers: Sit to Stand;Stand to Sit Sit to Stand: 4: Min guard;From bed Stand to Sit: 4: Min guard;To bed Details for Transfer Assistance: VCs safety, technique.  Ambulation/Gait Ambulation/Gait Assistance: 4: Min guard Ambulation Distance (Feet): 115 Feet Assistive device: Straight cane Ambulation/Gait Assistance Details: VCs safety. Once instance of experiencing dizziness, unsteadiness midway thorugh walk. Standing rest break before proceeding with ambulation back to room.  Gait Pattern: Within Functional Limits    Exercises     PT Diagnosis: Difficulty walking    PT Problem List: Decreased mobility PT Treatment Interventions: Gait training;Stair training;Functional mobility training;Therapeutic activities;Patient/family education   PT Goals Acute Rehab PT Goals PT Goal Formulation: With patient Time For Goal Achievement: 02/06/12 Potential to Achieve Goals: Good Pt will go Supine/Side to Sit: with modified independence PT Goal: Supine/Side to Sit - Progress: Goal set today Pt will go Sit to Supine/Side: with modified independence PT Goal: Sit to Supine/Side - Progress: Goal set today Pt will go Sit to Stand: with modified independence PT Goal: Sit to Stand - Progress: Goal set today Pt will Ambulate: >150 feet;with least restrictive assistive device PT Goal: Ambulate - Progress: Goal set today Pt will Go Up / Down Stairs: 3-5 stairs;with least restrictive assistive device PT Goal: Up/Down Stairs - Progress: Goal set today  Visit Information  Last PT Received On: 01/30/12 Assistance Needed: +1    Subjective Data  Subjective: "It just comes out of nowhere" Patient Stated Goal: Getter better and go home   Prior Functioning  Home Living Lives With: Spouse (sister) Available Help at Discharge: Family Type of Home: Apartment Home Access: Stairs to enter Secretary/administrator of Steps: 4 uneven steps Home Layout: One level Home Adaptive Equipment: Straight cane Prior Function Level of Independence: Independent with assistive device(s) Able to Take Stairs?: Yes Driving: Yes Communication Communication: No difficulties    Cognition  Overall Cognitive Status: Appears within functional limits for tasks assessed/performed Arousal/Alertness: Awake/alert Orientation Level: Appears intact for tasks assessed Behavior During Session: [facility] for tasks performed    Extremity/Trunk Assessment Right Lower Extremity Assessment RLE ROM/Strength/Tone: [facility] for tasks assessed RLE Coordination: WFL - gross motor Left Lower Extremity Assessment LLE  ROM/Strength/Tone: WFL for tasks assessed LLE Coordination: WFL -  gross motor Trunk Assessment Trunk Assessment: Normal   Balance    End of Session PT - End of Session Equipment Utilized During Treatment: Gait belt Activity Tolerance:  (Limited by dizziness, unsteadiness) Patient left: in bed;with call bell/phone within reach   Rebeca Alert [facility] 01/30/2012, 10:30 AM [phone]

## 2012-01-30 NOTE — Progress Notes (Signed)
Subjective: Reports feeling better. Did have headache during night relieved with med given. Reports increased dizziness with head movement.  Objective: Vital signs Filed Vitals:   01/29/12 2230 01/29/12 2245 01/30/12 0012 01/30/12 0525  BP: 176/90 156/71 162/90 135/80  Pulse: 78 70 71 61  Temp:   97.7 F (36.5 C) 97.9 F (36.6 C)  TempSrc:   Oral Oral  Resp:   18 19  Height:   5\' 7"  (1.702 m)   Weight:   122.018 kg (269 lb) 121.11 kg (267 lb)  SpO2: 96% 98% 95% 95%   Weight change:  Last BM Date: 01/29/12  Intake/Output from previous day: 05/09 0701 - 05/10 0700 In: 240 [P.O.:240] Out: 650 [Urine:650]     Physical Exam: General: Alert, awake, oriented x3, in no acute distress. HEENT: No bruits, no goiter. Mucus membranes mouth moist/pink. PERRL Heart: Regular rate and rhythm, without murmurs, rubs, gallops. Lungs: Normal effort. Breath sounds clear to auscultation bilaterally. No wheeze Abdomen:Obesem Soft, nontender, nondistended, positive bowel sounds. Extremities: No clubbing cyanosis with positive pedal pulses. Trace LEE Neuro: Grossly intact, nonfocal. Speech clear. Cranial nerve II-XII intact.    Lab Results: Basic Metabolic Panel:  Basename 01/30/12 0130 01/29/12 2015  NA 140 140  K 3.0* 2.7*  CL 102 101  CO2 27 27  GLUCOSE 166* 98  BUN 9 10  CREATININE 0.77 0.80  CALCIUM 9.2 9.3  MG 2.1 2.3  PHOS 4.4 --   Liver Function Tests:  Basename 01/30/12 0130 01/29/12 2015  AST 20 19  ALT 12 17  ALKPHOS 100 114  BILITOT 0.2* 0.2*  PROT 6.8 7.7  ALBUMIN 3.9 4.4   No results found for this basename: LIPASE:2,AMYLASE:2 in the last 72 hours No results found for this basename: AMMONIA:2 in the last 72 hours CBC:  Basename 01/30/12 0130 01/29/12 2015  WBC 8.1 8.1  NEUTROABS 3.8 3.9  HGB 13.1 14.4  HCT 39.3 42.3  MCV 88.7 87.4  PLT 339 324   Cardiac Enzymes:  Basename 01/30/12 0130 01/29/12 2015  CKTOTAL 241* --  CKMB 2.0 --  CKMBINDEX -- --    TROPONINI <0.30 <0.30   BNP:  Basename 01/30/12 0130  PROBNP 16.1   D-Dimer: No results found for this basename: DDIMER:2 in the last 72 hours CBG:  Basename 01/29/12 2023  GLUCAP 103*   Hemoglobin A1C: No results found for this basename: HGBA1C in the last 72 hours Fasting Lipid Panel: No results found for this basename: CHOL,HDL,LDLCALC,TRIG,CHOLHDL,LDLDIRECT in the last 72 hours Thyroid Function Tests: No results found for this basename: TSH,T4TOTAL,FREET4,T3FREE,THYROIDAB in the last 72 hours Anemia Panel: No results found for this basename: VITAMINB12,FOLATE,FERRITIN,TIBC,IRON,RETICCTPCT in the last 72 hours Coagulation:  Basename 01/29/12 2015  LABPROT 12.3  INR 0.90   Urine Drug Screen: Drugs of Abuse  No results found for this basename: labopia, cocainscrnur, labbenz, amphetmu, thcu, labbarb    Alcohol Level: No results found for this basename: ETH:2 in the last 72 hours Urinalysis: No results found for this basename: COLORURINE:2,APPERANCEUR:2,LABSPEC:2,PHURINE:2,GLUCOSEU:2,HGBUR:2,BILIRUBINUR:2,KETONESUR:2,PROTEINUR:2,UROBILINOGEN:2,NITRITE:2,LEUKOCYTESUR:2 in the last 72 hours Misc. Labs:  No results found for this or any previous visit (from the past 240 hour(s)).  Studies/Results: Mr Sherrin Daisy Contrast  01/29/2012  *RADIOLOGY REPORT*  Clinical Data: Syncope and dizziness over the last 7 hours.  MRI HEAD WITHOUT CONTRAST  Technique:  Multiplanar, multiecho pulse sequences of the brain and surrounding structures were obtained according to standard protocol without intravenous contrast.  Comparison: CT head without contrast 07/03/2010.  Findings: The  diffusion weighted images demonstrate no evidence for acute or subacute infarction.  A relatively empty sella is present, unlikely to be of clinical consequence to the patient.  No acute hemorrhage or mass lesion is present.  Minimal white matter changes are likely within normal limits for age.  The maxillary sinuses  are shrunken bilaterally.  The remaining paranasal sinuses and mastoid air cells are clear.  Flow is present in the major intracranial arteries.  The globes and orbits are intact.  IMPRESSION:  1.  Normal MRI appearance of the brain. 2.  Shrunken maxillary sinuses bilaterally, suggesting a history of chronic disease.  Original Report Authenticated By: Jamesetta Orleans. MATTERN, M.D.    Medications: Scheduled Meds:   . amitriptyline  25 mg Oral QHS  . amLODipine  5 mg Oral BID  . enoxaparin  30 mg Subcutaneous Q24H  . meclizine  12.5 mg Oral BID  . meclizine  25 mg Oral Once  . methadone  10 mg Oral Q8H  . metoprolol  50 mg Oral BID  . ondansetron  4 mg Intravenous Once  . pantoprazole  40 mg Oral Q1200  . potassium chloride  10 mEq Intravenous Once  . potassium chloride  40 mEq Oral Once  . potassium chloride  40 mEq Oral BID  . sodium chloride  3 mL Intravenous Q12H  . DISCONTD: furosemide  40 mg Oral Daily   Continuous Infusions:  PRN Meds:.butalbital-acetaminophen-caffeine, ondansetron (ZOFRAN) IV, ondansetron, oxyCODONE, temazepam  Assessment/Plan:  Principal Problem:  *Hypokalemia Active Problems:  HYPERTENSION  GERD  Lightheadedness  *Hypokalemia  - perhaps secondary to lasix given for lower extremity edema . Improved but still low in spite of repletion. Will continue daily potassium. Continue to hold lasix. Check BMET in am. Active Problems:  HYPERTENSION  - Improved but only fair control. Likely missed some doses of antihypertensives yesterday. SBP range 135-180. Will continue norvasc, metoprolol  Lower extremity edema  - mild .2 D ECHO pending  - hold lasix at this time  GERD  - continue protonix 40 mg daily  Lightheadedness  - unclear etiology but perhaps due to multiple pain medications pt is taking for back pain including oxycodone and methadone and/or vertigo. Pt describes worsening dizziness with head movement and hx "ears draining/feel full". Reports  improvement of symptoms with meclazine.  MRI brain done in ED with normal findings . Will continue meclizine 12.5 mg BID . CE neg to date.  TSH pendingl  - PT evaluation  Arthritis  - patient takes methadone for pain  - per patient she follows with pain management  - continue oxycodone for breakthrough pain  DVT Prophylaxis - lovenox subQ  Code Status - full code      LOS: 1 day   [facility] M 01/30/2012, 7:36 AM

## 2012-01-30 NOTE — Progress Notes (Signed)
Patient seen and examined; plan discussed with NP Toya Smothers; agree with her note and findings. Will replete potassium; follow 2-D echo results but doubt any CHF component. Continue holding lasix and get PT vestibular evaluation. PRN meclizine to be continue.  [name] [phone]

## 2012-01-30 NOTE — Progress Notes (Signed)
Physical Therapy Vestibular Evaluation  Subjective: Patient reports 2 week history of light headedness after starting on Furosemide.  She states yesterday was in the store and fell to one side.  Later at home after resting sat up and had room spinning dizziness.  States happened again later in the day after resting more, then sought medical attention.  Has been better since starting Meclizine, though has some symptoms during evaluation this afternoon.  Reports infrequent episodic tinnitus though none noted with this episode.  Had increased dullness with hearing on right as compared to left, but reports this is chronic.  Denies any sinus issues or allergic symptoms other than eye watering in the recent past.  Oculomotor: Eye alignment normal. Oculomotor movements WNL. Smooth pursuits WNL, through increase symptoms to 7/10.  No gaze holding nystagmus noted. Saccades WNL, increase symptoms to 6/10. Vestibular Occular Reflex intact with symptoms up to 7/10 after 30 seconds.   Head Thrust test: unable to perform due to patient resistive to passive head movements,  Did note two beats nystagmus with rightward head movement while attempting head thrust. VOR cancellation negative for central signs.  Motor testing deferred due to mobility evaluation performed earlier today.  See physical therapy evaluation for muscle and ROM testing.  Did note Cervical AROM WFL, but with pain with increased rotation.  Coordination testing WNL toe taps, pron/sup and finger to nose.    Positional testing: Performed [employer] to right and left with positive symptoms of "wooziness" up to 8/10 with right DHP, 7/10 with left DHP.  Negative for rotary nystagmus.    Gait: Patient ambulated with single point cane 400' with slow pace and cues for visual target to minimize symptoms of dizziness or imbalance.  Had no noted episodes of veering from straight path or of loss of balance.  Patient did complain of pain left LE due to  stiffness from being in bed.  Encouraged use of compensatory strategies using vision to aide during mobility while accommodating due to vestibulopathy.  Assessment:  Patient presents with some nystagmus during head turning during evaluation.  Though unable to complete head thrust test her subjective report is suspicious for unilateral vestibular hypofunction.  She will benefit from physical therapy services in outpatient setting to address dizziness and balance issues.  Feel she is safe for discharge home with spouse assist at this time.  Treatment: Patient was instructed in compensation as well as in gaze stabilization exercise in seated position for vestibular accommodation with written instructions given.  She was encouraged to seek vestibular rehab on outpatient basis with referral form and printed information on location of [facility].  Informed her nurse of this plan as well.  Plan: No further acute physical therapy needs at this time due to planned for discharge in am with outpatient rehab follow-up.  Parks, [place] [phone] 01/30/2012 [id number]

## 2012-01-31 DIAGNOSIS — R42 Dizziness and giddiness: Secondary | ICD-10-CM

## 2012-01-31 DIAGNOSIS — F3289 Other specified depressive episodes: Secondary | ICD-10-CM

## 2012-01-31 DIAGNOSIS — E876 Hypokalemia: Secondary | ICD-10-CM

## 2012-01-31 DIAGNOSIS — F329 Major depressive disorder, single episode, unspecified: Secondary | ICD-10-CM

## 2012-01-31 DIAGNOSIS — I10 Essential (primary) hypertension: Secondary | ICD-10-CM

## 2012-01-31 DIAGNOSIS — I517 Cardiomegaly: Secondary | ICD-10-CM

## 2012-01-31 LAB — BASIC METABOLIC PANEL
BUN: 13 mg/dL (ref 6–23)
CO2: 25 mEq/L (ref 19–32)
Calcium: 9 mg/dL (ref 8.4–10.5)
Chloride: 102 mEq/L (ref 96–112)
Creatinine, Ser: 0.83 mg/dL (ref 0.50–1.10)
GFR calc Af Amer: >90 mL/min (ref >90)
GFR calc non Af Amer: 80 mL/min — ABNORMAL LOW (ref >90)
Glucose, Bld: 154 mg/dL — ABNORMAL HIGH (ref 70–99)
Potassium: 3.8 mEq/L (ref 3.5–5.1)
Sodium: 139 mEq/L (ref 135–145)

## 2012-01-31 LAB — GLUCOSE, CAPILLARY: Glucose-Capillary: 115 mg/dL — ABNORMAL HIGH (ref 70–99)

## 2012-01-31 MED ORDER — ENOXAPARIN SODIUM 60 MG/0.6ML ~~LOC~~ SOLN: 60.0000 mg | SUBCUTANEOUS | Status: DC

## 2012-01-31 MED ORDER — MECLIZINE HCL 12.5 MG PO TABS: 12.5000 mg | ORAL_TABLET | Freq: Two times a day (BID) | ORAL | Status: AC | PRN

## 2012-01-31 MED ORDER — DIPHENHYDRAMINE HCL 25 MG PO CAPS
25.0000 mg | ORAL_CAPSULE | Freq: Three times a day (TID) | ORAL | Status: DC | PRN
  Administered 2012-01-31: 25 mg via ORAL
  Filled 2012-01-31: qty 1

## 2012-01-31 NOTE — Progress Notes (Signed)
Patient ID: [name], female   DOB: [birth year], 51 y.o.   MRN: [id number] Pt discharged home with spouse. Discharge instructions and follow up appointment reviewed with pt and spouse. Pt verbalizes understanding of instructions. No further concerns or questions at this time. Pt transferred down to lobby by NT, where spouse is waiting.

## 2012-01-31 NOTE — Progress Notes (Signed)
*  PRELIMINARY RESULTS* Echocardiogram 2D Echocardiogram has been performed.  Al Corpus 01/31/2012, 8:53 AM

## 2012-01-31 NOTE — Evaluation (Signed)
Occupational Therapy Evaluation Patient Details Name: [name] MRN: [id number] DOB: 1961-08-05 Today's Date: 01/31/2012 Time:  -     OT Assessment / Plan / Recommendation Clinical Impression  Pt is a 51 yo female who presents with syncope/dizziness. Pt is mod I with all ADLs with only 1 c/o dizziness during ambulation. Agree with PT recommendation for outpt vestibular rehab.     OT Assessment  Patient does not need any further OT services    Follow Up Recommendations  No OT follow up    Barriers to Discharge      Equipment Recommendations  None recommended by OT    Recommendations for Other Services    Frequency       Precautions / Restrictions Precautions Precautions: Fall   Pertinent Vitals/Pain     ADL  Grooming: Performed;Wash/dry hands;Teeth care;Modified independent Where Assessed - Grooming: Standing at sink Upper Body Bathing: Simulated;Modified independent Where Assessed - Upper Body Bathing: Standing at sink Lower Body Bathing: Simulated;Modified independent Where Assessed - Lower Body Bathing: Sit to stand from bed Upper Body Dressing: Simulated;Modified independent Where Assessed - Upper Body Dressing: Standing Lower Body Dressing: Performed;Modified independent Where Assessed - Lower Body Dressing: Sitting, bed;Unsupported;Sit to stand from bed Toilet Transfer: Performed;Modified independent Toilet Transfer Method: Proofreader: Regular height toilet Toileting - Clothing Manipulation: Simulated;Modified independent Where Assessed - Toileting Clothing Manipulation: Sit to stand from 3-in-1 or toilet Toileting - Hygiene: Simulated;Modified independent Where Assessed - Toileting Hygiene: Sit to stand from 3-in-1 or toilet Tub/Shower Transfer: Simulated;Supervision/safety Tub/Shower Transfer Method: Ambulating Equipment Used: Other (comment) [facility]) Ambulation Related to ADLs: Pt ambulated to the bathroom with a SPC and 1 c/o  dizziness. No LOB noted.    OT Diagnosis:    OT Problem List:   OT Treatment Interventions:     OT Goals    Visit Information  Last OT Received On: 01/31/12 Assistance Needed: +1    Subjective Data  Subjective: "I got dizzy earlier." Patient Stated Goal: "To go home today."   Prior Functioning  Home Living Lives With: Spouse (and sister.) Available Help at Discharge: Family Type of Home: Apartment Home Access: Stairs to enter Secretary/administrator of Steps: 4 uneven steps Entrance Stairs-Rails: Right Home Layout: One level Bathroom Shower/Tub: Engineer, manufacturing systems: Handicapped height Home Adaptive Equipment: Raised toilet seat with rails;Straight cane;Walker - rolling Prior Function Level of Independence: Independent with assistive device(s) Able to Take Stairs?: Yes Driving: Yes Vocation: Retired Musician: No difficulties Dominant Hand: Right    Cognition  Overall Cognitive Status: Appears within functional limits for tasks assessed/performed Arousal/Alertness: Awake/alert Orientation Level: Appears intact for tasks assessed Behavior During Session: [facility] for tasks performed    Extremity/Trunk Assessment Right Upper Extremity Assessment RUE ROM/Strength/Tone: [facility] for tasks assessed Left Upper Extremity Assessment LUE ROM/Strength/Tone: [facility] for tasks assessed   Mobility Bed Mobility Bed Mobility: Supine to Sit;Sit to Supine Supine to Sit: 6: Modified independent (Device/Increase time) Sit to Supine: 6: Modified independent (Device/Increase time) Details for Bed Mobility Assistance: Increased time Transfers Sit to Stand: 5: Supervision;From bed;From toilet;With upper extremity assist Stand to Sit: 5: Supervision;With upper extremity assist;To toilet;To bed   Exercise    Balance    End of Session OT - End of Session Equipment Utilized During Treatment: Gait belt Activity Tolerance: Patient tolerated treatment well Patient left:  in bed;with call bell/phone within reach;with family/visitor present   Kasir Hallenbeck A OTR/L [phone] 01/31/2012, 1:01 PM

## 2012-01-31 NOTE — Discharge Instructions (Signed)

## 2012-01-31 NOTE — Discharge Summary (Signed)
Physician Discharge Summary  Patient ID: [name] MRN: [id number] DOB/AGE: [birth year] 52 y.o.  Admit date: 01/29/2012 Discharge date: 01/31/2012  Primary Care Physician:  Nelwyn Salisbury, MD, MD   Discharge Diagnoses:   1- lightheadedness (2/2 to vertigo and orthostatic changes) 2-Orthostasis due to use of furosemide 3-Hypokalemia 4-HYPERTENSION 5-GERD 6-Chronic pain 7-Insomnia   Medication List  As of 01/31/2012  3:09 PM   STOP taking these medications         furosemide 40 MG tablet         TAKE these medications         amitriptyline 25 MG tablet   Commonly known as: ELAVIL   Take 25 mg by mouth at bedtime.      amLODipine 5 MG tablet   Commonly known as: NORVASC   Take 1 tablet (5 mg total) by mouth 2 (two) times daily.      meclizine 12.5 MG tablet   Commonly known as: ANTIVERT   Take 1 tablet (12.5 mg total) by mouth every 12 (twelve) hours as needed for dizziness.      methadone 10 MG tablet   Commonly known as: DOLOPHINE   Take 10 mg by mouth every 8 (eight) hours.      metoprolol 50 MG tablet   Commonly known as: LOPRESSOR   Take 1 tablet (50 mg total) by mouth 2 (two) times daily.      omeprazole 40 MG capsule   Commonly known as: PRILOSEC   TAKE ONE CAPSULE BY MOUTH EVERY DAY      oxyCODONE 5 MG immediate release tablet   Commonly known as: Oxy IR/ROXICODONE   Take 5 mg by mouth. 1-2 every 3 hours as needed for pain      oxyCODONE 15 MG immediate release tablet   Commonly known as: ROXICODONE   Take 15 mg by mouth every 4 (four) hours as needed.      temazepam 30 MG capsule   Commonly known as: RESTORIL   Take 30 mg by mouth at bedtime as needed. Needed for sleep      temazepam 30 MG capsule   Commonly known as: RESTORIL   Take 1 capsule (30 mg total) by mouth at bedtime as needed for sleep.             Disposition and Follow-up:  Patient discharge in stable and improved condition; she will follow with vestibular center at  discharge for further physical therapy as an outpatient. Instructed to stop using lasix, to keep herself hydrated and to follow with PCP in 2 weeks. Vital signs stable and patient with just minimal dizziness sensation when changing positions. Meclizine PRN prescribed.  Consults:  PT   Significant Diagnostic Studies:  Mr Brain Wo Contrast  01/29/2012  *RADIOLOGY REPORT*  Clinical Data: Syncope and dizziness over the last 7 hours.  MRI HEAD WITHOUT CONTRAST  Technique:  Multiplanar, multiecho pulse sequences of the brain and surrounding structures were obtained according to standard protocol without intravenous contrast.  Comparison: CT head without contrast 07/03/2010.  Findings: The diffusion weighted images demonstrate no evidence for acute or subacute infarction.  A relatively empty sella is present, unlikely to be of clinical consequence to the patient.  No acute hemorrhage or mass lesion is present.  Minimal white matter changes are likely within normal limits for age.  The maxillary sinuses are shrunken bilaterally.  The remaining paranasal sinuses and mastoid air cells are clear.  Flow is present in the major  intracranial arteries.  The globes and orbits are intact.  IMPRESSION:  1.  Normal MRI appearance of the brain. 2.  Shrunken maxillary sinuses bilaterally, suggesting a history of chronic disease.  Original Report Authenticated By: Jamesetta Orleans. MATTERN, M.D.    Brief H and P:  51 year old female with multiple co morbidities including but not limited to HTN, headaches, hypokalemia, neuropathy who presented to ED with complaints of feeling lightheaded and dizzy which started on the day of admission without losing consciousness. Patient reports no associated chest pain, no shortness of breath, no cough, no fever or chills, no abdominal pain, no nausea or vomiting. Patient does report she gets better when she lays down and as soon as she tries to get up she starts feeling dizzy.    Hospital  Course:   1-Hypokalemia  - perhaps secondary to lasix given for lower extremity edema . Back to normal after repletion. Advised to stop lasix. 2-D echo r/o any abnormalities on her heart as part of LE edema. Meaning is probably secondary to venous insufficiency. Advised to keep leg elevated and to use stocking socks. Will follow with PCP in 2 weeks.  2-HYPERTENSION  - Stable; continue home regimen and low sdoium diet.  3-Lower extremity edema  -as mentioned above 2/2 to venous insufficiency.  GERD  - continue omeprazole 40 mg daily   Lightheadedness  - 2/2 to vertigo and orthostasis; practically resolved with IVF and vestibular maneuvers by PT. She will follow with PT as an outpatient for vestibular rehab. -Meclizine PRN prescribed.  Arthritis  - stable; continue pain medications as previously prescribed.   Rest of problems remains stable, further treatment per PCP during follow up appointment; meanwhile continue current medication regimen.    Time spent on Discharge: 40 minutes  Signed: Lashonta Pilling 01/31/2012, 3:09 PM

## 2012-02-02 ENCOUNTER — Telehealth: Payer: Self-pay | Admitting: Family Medicine

## 2012-02-02 NOTE — Telephone Encounter (Signed)
Patient called stating that she was hospitalized and released on Saturday and the hospital would like an rx for physical therapy to start tomorrow morning at 9:15 faxed to [phone]. Ph. [phone]. Please assist.

## 2012-02-03 ENCOUNTER — Encounter: Payer: Self-pay | Admitting: Family Medicine

## 2012-02-03 ENCOUNTER — Ambulatory Visit (INDEPENDENT_AMBULATORY_CARE_PROVIDER_SITE_OTHER): Payer: 59 | Admitting: Family Medicine

## 2012-02-03 ENCOUNTER — Encounter: Payer: 59 | Admitting: Physical Therapy

## 2012-02-03 VITALS — BP 130/90 | HR 82 | Temp 98.7°F | Wt 271.0 lb

## 2012-02-03 DIAGNOSIS — M6740 Ganglion, unspecified site: Secondary | ICD-10-CM

## 2012-02-03 DIAGNOSIS — R42 Dizziness and giddiness: Secondary | ICD-10-CM

## 2012-02-03 DIAGNOSIS — I10 Essential (primary) hypertension: Secondary | ICD-10-CM

## 2012-02-03 NOTE — Progress Notes (Signed)
  Subjective:    Patient ID: [name], female    DOB: 07/07/1961, 51 y.o.   MRN: [id number]  HPI Here to follow up after a hospital stay from 01-29-12 to 01-31-12 for dizziness. This was felt to be due to a combination of vertigo and hypotension. Her labs revealed a low potassium so this was corrected. She was told to stop taking Lasix. She started getting vestibular rehab with the PT department, and they recommended she continue this as an out patient. Her brain MRI was normal. She has been much better since she got home. No more dizzy spells. She has only used Meclizine once. She does not want to go to rehab since she feels better and she would have difficulty paying for this. Her BP is stable. She also asks me to look at a painful knot on the left wrist that came up one month ago.    Review of Systems  Constitutional: Negative.   Respiratory: Negative.   Cardiovascular: Negative.   Neurological: Positive for dizziness and light-headedness. Negative for seizures, syncope, speech difficulty, weakness, numbness and headaches.       Objective:   Physical Exam  Constitutional: She is oriented to person, place, and time. She appears well-developed and well-nourished.  Neck: No thyromegaly present.  Cardiovascular: Normal rate, regular rhythm, normal heart sounds and intact distal pulses.   Pulmonary/Chest: Effort normal and breath sounds normal.  Musculoskeletal:       Tender mobile firm knot over the radial left wrist. No edema, full ROM  Lymphadenopathy:    She has no cervical adenopathy.  Neurological: She is alert and oriented to person, place, and time. She has normal reflexes. No cranial nerve deficit. She exhibits normal muscle tone. Coordination normal.          Assessment & Plan:  This seems to have been due to vertigo for the most part. I agree with not going to vestibular rehab unless this gets worse. Use Meclizine prn. Stay off Lasix. We will recheck a BMET in one month.  We will refer to Hand Surgery to remove the cyst.

## 2012-02-03 NOTE — Telephone Encounter (Signed)
I cannot do this sight unseen. Have her see me OV and we can discuss this

## 2012-02-03 NOTE — Telephone Encounter (Signed)
Can you call and offer a office visit?  

## 2012-02-13 ENCOUNTER — Ambulatory Visit: Payer: 59 | Admitting: Family Medicine

## 2012-04-06 ENCOUNTER — Encounter (INDEPENDENT_AMBULATORY_CARE_PROVIDER_SITE_OTHER): Payer: 59 | Admitting: Surgery

## 2012-07-04 ENCOUNTER — Other Ambulatory Visit: Payer: Self-pay | Admitting: Family Medicine

## 2012-07-07 ENCOUNTER — Telehealth: Payer: Self-pay

## 2012-07-07 ENCOUNTER — Ambulatory Visit (INDEPENDENT_AMBULATORY_CARE_PROVIDER_SITE_OTHER): Payer: 59 | Admitting: Family Medicine

## 2012-07-07 ENCOUNTER — Encounter: Payer: Self-pay | Admitting: Family Medicine

## 2012-07-07 VITALS — BP 170/90 | HR 74 | Temp 98.5°F | Wt 271.0 lb

## 2012-07-07 DIAGNOSIS — Z23 Encounter for immunization: Secondary | ICD-10-CM

## 2012-07-07 DIAGNOSIS — D1779 Benign lipomatous neoplasm of other sites: Secondary | ICD-10-CM

## 2012-07-07 DIAGNOSIS — D171 Benign lipomatous neoplasm of skin and subcutaneous tissue of trunk: Secondary | ICD-10-CM

## 2012-07-07 NOTE — Progress Notes (Signed)
Chief Complaint  Patient presents with  . mass on left shoulder blade    painful and burning;     HPI:  Lump on back: -has had it there for a number of years -hx of lipomas, removed by surgery, wants to get referral to have this removed as bothering her as under bra strap, called specialist about this, but he retired -has been a little painful last several weeks, doesn't seem to have changed in size much -wonders if this is another lipoma -no fevers, chills, or drainage  ROS: See pertinent positives and negatives per HPI.  Past Medical History  Diagnosis Date  . Osteoarthritis   . Headache     migraine like per patient  . Hypertension   . Low back pain     dr phillips, pain management  . Degenerative joint disease   . Gallstones   . GERD (gastroesophageal reflux disease)     Family History  Problem Relation Age of Onset  . Crohn's disease Other     nephew  . Cancer Mother     stomach  . Heart disease Father   . Cancer Sister     kidney - one out of the four  . Hypertension Sister   . Hypertension Brother     History   Social History  . Marital Status: Married    Spouse Name: N/A    Number of Children: N/A  . Years of Education: N/A   Social History Main Topics  . Smoking status: Current Every Day Smoker -- 0.2 packs/day for 20 years    Types: Cigarettes  . Smokeless tobacco: Never Used  . Alcohol Use: No  . Drug Use: No  . Sexually Active: Yes    Birth Control/ Protection: Post-menopausal   Other Topics Concern  . None   Social History Narrative  . None    Current outpatient prescriptions:amitriptyline (ELAVIL) 25 MG tablet, Take 25 mg by mouth at bedtime. , Disp: , Rfl: ;  amLODipine (NORVASC) 5 MG tablet, Take 1 tablet (5 mg total) by mouth 2 (two) times daily., Disp: 60 tablet, Rfl: 11;  methadone (DOLOPHINE) 10 MG tablet, Take 10 mg by mouth every 8 (eight) hours. , Disp: , Rfl:  metoprolol (LOPRESSOR) 50 MG tablet, Take 1 tablet (50 mg total)  by mouth 2 (two) times daily., Disp: 60 tablet, Rfl: 11;  omeprazole (PRILOSEC) 40 MG capsule, TAKE ONE CAPSULE BY MOUTH EVERY DAY, Disp: 30 capsule, Rfl: 10;  oxyCODONE (ROXICODONE) 15 MG immediate release tablet, Take 15 mg by mouth every 4 (four) hours as needed., Disp: , Rfl:  oxyCODONE (ROXICODONE) 5 MG immediate release tablet, Take 5 mg by mouth. 1-2 every 3 hours as needed for pain, Disp: , Rfl: ;  temazepam (RESTORIL) 30 MG capsule, Take 1 capsule (30 mg total) by mouth at bedtime as needed for sleep., Disp: 30 capsule, Rfl: 5;  temazepam (RESTORIL) 30 MG capsule, Take 30 mg by mouth at bedtime as needed. Needed for sleep, Disp: , Rfl:   EXAM:  Filed Vitals:   07/07/12 1047  BP: 170/90  Pulse: 74  Temp: 98.5 F (36.9 C)    There is no height on file to calculate BMI.  GENERAL: vitals reviewed and listed above, alert, oriented, appears well hydrated and in no acute distress  SKIN: soft mobile subcutaneous mass approx 5x3cm located on L upper back  MS: moves all extremities without noticeable abnormality  PSYCH: pleasant and cooperative, no obvious depression or anxiety  ASSESSMENT AND PLAN:  Discussed the following assessment and plan:  1. Lipoma of back  Ambulatory referral to General Surgery   -suspect lipoma, larger lesion and on difficult area - will refer to gen surg -Patient advised to return or notify a doctor immediately if symptoms worsen or persist or new concerns arise.  There are no Patient Instructions on file for this visit.   Kriste Basque R.

## 2012-07-07 NOTE — Telephone Encounter (Signed)
Call in #[phone] with 5 rf 

## 2012-07-07 NOTE — Patient Instructions (Signed)
-  We placed a referral for you as discussed. It usually takes about 1-2 weeks to process and schedule this referral. If you have not heard from us regarding this appointment in 2 weeks please contact our office.  

## 2012-07-07 NOTE — Telephone Encounter (Signed)
Per Dr. Selena Batten called pt to advise that since pt's bp was elevated.  Pt needs to follow up with PCP in 2 weeks. Left a message for return call.

## 2012-07-08 NOTE — Telephone Encounter (Signed)
Left a detailed message making pt aware to make a follow up visit with PCP in 2 weeks to discuss bp.

## 2012-07-21 ENCOUNTER — Encounter: Payer: Self-pay | Admitting: Family Medicine

## 2012-07-21 ENCOUNTER — Ambulatory Visit (INDEPENDENT_AMBULATORY_CARE_PROVIDER_SITE_OTHER): Payer: 59 | Admitting: Family Medicine

## 2012-07-21 VITALS — BP 150/82 | HR 79 | Temp 98.4°F | Wt 268.0 lb

## 2012-07-21 DIAGNOSIS — I10 Essential (primary) hypertension: Secondary | ICD-10-CM

## 2012-07-21 LAB — HEPATIC FUNCTION PANEL
ALT: 17 U/L (ref 0–35)
AST: 19 U/L (ref 0–37)
Albumin: 4 g/dL (ref 3.5–5.2)
Alkaline Phosphatase: 98 U/L (ref 39–117)
Bilirubin, Direct: 0 mg/dL (ref 0.0–0.3)
Total Bilirubin: 0.1 mg/dL — ABNORMAL LOW (ref 0.3–1.2)
Total Protein: 7.1 g/dL (ref 6.0–8.3)

## 2012-07-21 LAB — TSH: TSH: 0.9 u[IU]/mL (ref 0.35–5.50)

## 2012-07-21 LAB — CBC WITH DIFFERENTIAL/PLATELET
Basophils Absolute: 0 10*3/uL (ref 0.0–0.1)
Basophils Relative: 0.4 % (ref 0.0–3.0)
Eosinophils Absolute: 0.2 10*3/uL (ref 0.0–0.7)
Eosinophils Relative: 2 % (ref 0.0–5.0)
HCT: 40.3 % (ref 36.0–46.0)
Hemoglobin: 13.5 g/dL (ref 12.0–15.0)
Lymphocytes Relative: 41.5 % (ref 12.0–46.0)
Lymphs Abs: 3.3 10*3/uL (ref 0.7–4.0)
MCHC: 33.5 g/dL (ref 30.0–36.0)
MCV: 88.4 fl (ref 78.0–100.0)
Monocytes Absolute: 0.4 10*3/uL (ref 0.1–1.0)
Monocytes Relative: 4.5 % (ref 3.0–12.0)
Neutro Abs: 4 10*3/uL (ref 1.4–7.7)
Neutrophils Relative %: 51.6 % (ref 43.0–77.0)
Platelets: 311 10*3/uL (ref 150.0–400.0)
RBC: 4.56 Mil/uL (ref 3.87–5.11)
RDW: 14.1 % (ref 11.5–14.6)
WBC: 7.8 10*3/uL (ref 4.5–10.5)

## 2012-07-21 LAB — BASIC METABOLIC PANEL
BUN: 12 mg/dL (ref 6–23)
CO2: 27 mEq/L (ref 19–32)
Calcium: 9.1 mg/dL (ref 8.4–10.5)
Chloride: 105 mEq/L (ref 96–112)
Creatinine, Ser: 0.9 mg/dL (ref 0.4–1.2)
GFR: 83.27 mL/min (ref >60.00)
Glucose, Bld: 116 mg/dL — ABNORMAL HIGH (ref 70–99)
Potassium: 3.1 mEq/L — ABNORMAL LOW (ref 3.5–5.1)
Sodium: 141 mEq/L (ref 135–145)

## 2012-07-21 MED ORDER — AMLODIPINE BESYLATE 10 MG PO TABS: 10.0000 mg | ORAL_TABLET | Freq: Every day | ORAL | Status: DC

## 2012-07-21 NOTE — Progress Notes (Signed)
  Subjective:    Patient ID: [name], female    DOB: Aug 19, 1961, 51 y.o.   MRN: [id number]  HPI Here to check her BP. When she was here 2 weeks ago her BP was up to 170/90 and she often gets 150s systolic at home. She feels fine, no chest pain or HA or SOB.    Review of Systems  Constitutional: Negative.   Respiratory: Negative.   Cardiovascular: Negative.   Neurological: Negative.        Objective:   Physical Exam  Constitutional: She is oriented to person, place, and time. She appears well-developed and well-nourished. No distress.  Cardiovascular: Normal rate, regular rhythm, normal heart sounds and intact distal pulses.   Pulmonary/Chest: Effort normal and breath sounds normal.  Musculoskeletal:       2+ edema in both feet   Neurological: She is alert and oriented to person, place, and time.          Assessment & Plan:  We will increase her Amlodipine to 10 mg a day. Get some labs today. Recheck one month

## 2012-07-26 MED ORDER — POTASSIUM CHLORIDE CRYS ER 10 MEQ PO TBCR: 10.0000 meq | EXTENDED_RELEASE_TABLET | Freq: Every day | ORAL | Status: DC

## 2012-07-26 NOTE — Progress Notes (Signed)
Quick Note:  I spoke with pt and sent script e-scribe. ______ 

## 2012-07-26 NOTE — Addendum Note (Signed)
Addended by: Aniceto Boss A on: 07/26/2012 05:33 PM   Modules accepted: Orders

## 2012-08-03 ENCOUNTER — Ambulatory Visit (INDEPENDENT_AMBULATORY_CARE_PROVIDER_SITE_OTHER): Payer: 59 | Admitting: Surgery

## 2012-08-03 ENCOUNTER — Encounter (INDEPENDENT_AMBULATORY_CARE_PROVIDER_SITE_OTHER): Payer: Self-pay | Admitting: Surgery

## 2012-08-03 VITALS — BP 138/80 | HR 84 | Temp 97.6°F | Resp 24 | Ht 67.0 in | Wt 274.4 lb

## 2012-08-03 DIAGNOSIS — D492 Neoplasm of unspecified behavior of bone, soft tissue, and skin: Secondary | ICD-10-CM

## 2012-08-03 NOTE — Progress Notes (Signed)
General Surgery - [facility]  Visit Diagnoses: 1. Neoplasm of soft tissue, left shoulder and left upper arm     HISTORY: Patient is a 51-year-old black female known to my practice. She had undergone cholecystectomy in February 2013.  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 The sibling gradually enlarging and causing the patient discomfort. 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 She presents today for surgical evaluation for excision of these tumors.  PERTINENT REVIEW OF SYSTEMS: Patient describes a neurologic type pain across the left shoulder related to the mass. Both masses and gradually enlarged in size. No other cutaneous changes.  EXAM: HEENT: normocephalic; pupils equal and reactive; sclerae clear; dentition good; mucous membranes moist NECK:  symmetric on extension; no palpable anterior or posterior cervical lymphadenopathy; no supraclavicular masses; no tenderness CHEST: clear to auscultation bilaterally without rales, rhonchi, or wheezes CARDIAC: regular rate and rhythm without significant murmur; peripheral pulses are full BACK:  Firm soft tissue mass overlying the left scapula measuring 4 cm x 3 cm x 1 cm in size; there is mild tenderness to palpation EXT:  non-tender without edema; no deformity; there is an approximately 1 x 1 x 1 cm discrete mass below the deltoid muscle on the lateral left upper arm which is mobile and mildly tender NEURO: no gross focal deficits; no sign of tremor   IMPRESSION: #1 soft tissue mass left shoulder, 4 x 3 x 1 cm, 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 We will make arrangements for outpatient surgery. I explained to her the surgical incisions to be anticipated. We discussed  the potential for neurologic symptoms in the left shoulder given the preoperative symptoms that she has been experiencing. She understands.  The risks and benefits of the procedure have been discussed at length with the patient.  The patient understands the proposed procedure, potential alternative treatments, and the course of recovery to be expected.  All of the patient's questions have been answered at this time.  The patient wishes to proceed with surgery.  Kaleisha Bhargava M. Elverna Caffee, MD, FACS [facility] Office: [phone]    

## 2012-08-03 NOTE — Patient Instructions (Signed)
  [facility] -- DISCHARGE INSTRUCTIONS  REMINDER:   Carry a list of your medications and allergies with you at all times  Call your pharmacy at least 1 week in advance to refill prescriptions  Do not mix any prescribed pain medicine with alcohol  Do not drive any motor vehicles while taking pain medication  Take medications with food unless otherwise directed  Follow-up appointments (date to return to physician): Please call [phone] to confirm your follow up appointment with your surgeon.  Call your Surgeon if you have:  Temperature greater than 101.0  Persistent nausea and vomiting  Severe uncontrolled pain  Redness, tenderness, or signs of infection (pain, swelling, redness, odor or    green/yellow discharge around the site)  Difficulty breathing, headache or visual disturbances  Hives  Persistent dizziness or light-headedness  Any other questions or concerns you may have after discharge  In an emergency, call 911 or go to an Emergency Department at a nearby hospital.  Diet: Begin with liquids, and if they are tolerated, resume your usual diet.  Avoid spicy, greasy or heavy foods.  If you have nausea or vomiting, go back to liquids.  If you cannot keep liquids down, call your doctor.  Avoid alcohol consumption while on prescription pain medications. Good nutrition promotes healing. Increase fiber and fluids.   ADDITIONAL INSTRUCTIONS:   Arya Boxley M. Riyah Bardon, MD, FACS [facility] Office: [phone] 

## 2012-08-18 ENCOUNTER — Encounter (HOSPITAL_BASED_OUTPATIENT_CLINIC_OR_DEPARTMENT_OTHER): Payer: Self-pay | Admitting: *Deleted

## 2012-08-18 NOTE — Progress Notes (Signed)
To come in for bmet-denies any resp problems or cardiac or sleep apnea

## 2012-08-23 ENCOUNTER — Encounter (HOSPITAL_BASED_OUTPATIENT_CLINIC_OR_DEPARTMENT_OTHER)
Admission: RE | Admit: 2012-08-23 | Discharge: 2012-08-23 | Disposition: A | Discharge status: Home / Self Care | Payer: 59 | Source: Ambulatory Visit | Attending: Orthopedic Surgery | Admitting: Orthopedic Surgery

## 2012-08-24 ENCOUNTER — Encounter (HOSPITAL_BASED_OUTPATIENT_CLINIC_OR_DEPARTMENT_OTHER)
Admission: RE | Disposition: A | Discharge status: Home / Self Care | Payer: Self-pay | Source: Ambulatory Visit | Attending: Surgery

## 2012-08-24 ENCOUNTER — Encounter (HOSPITAL_BASED_OUTPATIENT_CLINIC_OR_DEPARTMENT_OTHER): Payer: Self-pay | Admitting: *Deleted

## 2012-08-24 ENCOUNTER — Encounter (HOSPITAL_BASED_OUTPATIENT_CLINIC_OR_DEPARTMENT_OTHER): Payer: Self-pay | Admitting: Anesthesiology

## 2012-08-24 ENCOUNTER — Ambulatory Visit (HOSPITAL_BASED_OUTPATIENT_CLINIC_OR_DEPARTMENT_OTHER)
Admission: RE | Admit: 2012-08-24 | Discharge: 2012-08-24 | Disposition: A | Discharge status: Home / Self Care | Payer: 59 | Source: Ambulatory Visit | Attending: Surgery | Admitting: Surgery

## 2012-08-24 ENCOUNTER — Ambulatory Visit (HOSPITAL_BASED_OUTPATIENT_CLINIC_OR_DEPARTMENT_OTHER): Payer: 59 | Admitting: Anesthesiology

## 2012-08-24 DIAGNOSIS — D1739 Benign lipomatous neoplasm of skin and subcutaneous tissue of other sites: Secondary | ICD-10-CM

## 2012-08-24 DIAGNOSIS — I10 Essential (primary) hypertension: Secondary | ICD-10-CM | POA: Insufficient documentation

## 2012-08-24 DIAGNOSIS — F17200 Nicotine dependence, unspecified, uncomplicated: Secondary | ICD-10-CM | POA: Insufficient documentation

## 2012-08-24 DIAGNOSIS — D492 Neoplasm of unspecified behavior of bone, soft tissue, and skin: Secondary | ICD-10-CM

## 2012-08-24 HISTORY — PX: MASS EXCISION: SHX2000

## 2012-08-24 HISTORY — DX: Other cervical disc degeneration, unspecified cervical region: M50.30

## 2012-08-24 LAB — POCT HEMOGLOBIN-HEMACUE: Hemoglobin: 12.6 g/dL (ref 12.0–15.0)

## 2012-08-24 SURGERY — EXCISION MASS
Anesthesia: Monitor Anesthesia Care | Site: Back | Laterality: Left | Wound class: Clean

## 2012-08-24 MED ORDER — LACTATED RINGERS IV SOLN
INTRAVENOUS | Status: DC
  Administered 2012-08-24: 20 mL/h via INTRAVENOUS

## 2012-08-24 MED ORDER — BUPIVACAINE HCL (PF) 0.5 % IJ SOLN
INTRAMUSCULAR | Status: DC | PRN
  Administered 2012-08-24: 38 mL

## 2012-08-24 MED ORDER — ONDANSETRON HCL 4 MG/2ML IJ SOLN
INTRAMUSCULAR | Status: DC | PRN
  Administered 2012-08-24: 4 mg via INTRAVENOUS

## 2012-08-24 MED ORDER — PROPOFOL 10 MG/ML IV EMUL
INTRAVENOUS | Status: DC | PRN
  Administered 2012-08-24: 100 ug/kg/min via INTRAVENOUS

## 2012-08-24 MED ORDER — HYDROCODONE-ACETAMINOPHEN 5-325 MG PO TABS: 1.0000 | ORAL_TABLET | ORAL | Status: DC | PRN

## 2012-08-24 MED ORDER — OXYCODONE HCL 5 MG PO TABS
5.0000 mg | ORAL_TABLET | Freq: Once | ORAL | Status: AC | PRN
  Administered 2012-08-24: 5 mg via ORAL

## 2012-08-24 MED ORDER — PROPOFOL INFUSION 10 MG/ML OPTIME
INTRAVENOUS | Status: DC | PRN
  Administered 2012-08-24: 40 mL via INTRAVENOUS

## 2012-08-24 MED ORDER — LIDOCAINE HCL (CARDIAC) 20 MG/ML IV SOLN
INTRAVENOUS | Status: DC | PRN
  Administered 2012-08-24: 25 mg via INTRAVENOUS

## 2012-08-24 MED ORDER — MIDAZOLAM HCL 5 MG/5ML IJ SOLN
INTRAMUSCULAR | Status: DC | PRN
  Administered 2012-08-24 (×2): 1 mg via INTRAVENOUS

## 2012-08-24 MED ORDER — HYDROMORPHONE HCL PF 1 MG/ML IJ SOLN
0.2500 mg | INTRAMUSCULAR | Status: DC | PRN
  Administered 2012-08-24: 0.5 mg via INTRAVENOUS

## 2012-08-24 MED ORDER — MIDAZOLAM HCL 2 MG/2ML IJ SOLN: 1.0000 mg | INTRAMUSCULAR | Status: DC | PRN

## 2012-08-24 MED ORDER — FENTANYL CITRATE 0.05 MG/ML IJ SOLN
INTRAMUSCULAR | Status: DC | PRN
  Administered 2012-08-24: 25 ug via INTRAVENOUS
  Administered 2012-08-24: 50 ug via INTRAVENOUS
  Administered 2012-08-24 (×3): 25 ug via INTRAVENOUS

## 2012-08-24 MED ORDER — FENTANYL CITRATE 0.05 MG/ML IJ SOLN: 50.0000 ug | INTRAMUSCULAR | Status: DC | PRN

## 2012-08-24 MED ORDER — DEXTROSE 5 % IV SOLN
3.0000 g | INTRAVENOUS | Status: AC
  Administered 2012-08-24: 3 g via INTRAVENOUS

## 2012-08-24 MED ORDER — OXYCODONE HCL 5 MG/5ML PO SOLN: 5.0000 mg | Freq: Once | ORAL | Status: AC | PRN

## 2012-08-24 SURGICAL SUPPLY — 40 items
APL SKNCLS STERI-STRIP NONHPOA (GAUZE/BANDAGES/DRESSINGS) ×1
BANDAGE GAUZE ELAST BULKY 4 IN (GAUZE/BANDAGES/DRESSINGS) IMPLANT
BENZOIN TINCTURE PRP APPL 2/3 (GAUZE/BANDAGES/DRESSINGS) ×1 IMPLANT
BLADE SURG 15 STRL LF DISP TIS (BLADE) ×1 IMPLANT
BLADE SURG 15 STRL SS (BLADE) ×2
BNDG COHESIVE 4X5 TAN STRL (GAUZE/BANDAGES/DRESSINGS) IMPLANT
CHLORAPREP W/TINT 26ML (MISCELLANEOUS) ×2 IMPLANT
CLEANER CAUTERY TIP 5X5 PAD (MISCELLANEOUS) IMPLANT
CLOTH BEACON ORANGE TIMEOUT ST (SAFETY) ×2 IMPLANT
COVER MAYO STAND STRL (DRAPES) ×2 IMPLANT
COVER TABLE BACK 60X90 (DRAPES) ×2 IMPLANT
DECANTER SPIKE VIAL GLASS SM (MISCELLANEOUS) IMPLANT
DRAPE EXTREMITY T 121X128X90 (DRAPE) IMPLANT
DRAPE PED LAPAROTOMY (DRAPES) ×2 IMPLANT
DRAPE U-SHAPE 76X120 STRL (DRAPES) IMPLANT
DRAPE UTILITY XL STRL (DRAPES) ×2 IMPLANT
DRSG TEGADERM 4X4.75 (GAUZE/BANDAGES/DRESSINGS) IMPLANT
ELECT REM PT RETURN 9FT ADLT (ELECTROSURGICAL) ×2
ELECTRODE REM PT RTRN 9FT ADLT (ELECTROSURGICAL) ×1 IMPLANT
GAUZE SPONGE 4X4 12PLY STRL LF (GAUZE/BANDAGES/DRESSINGS) ×2 IMPLANT
GLOVE BIO SURGEON STRL SZ 6.5 (GLOVE) ×1 IMPLANT
GLOVE INDICATOR 7.0 STRL GRN (GLOVE) ×1 IMPLANT
GLOVE SURG ORTHO 8.0 STRL STRW (GLOVE) ×2 IMPLANT
GOWN PREVENTION PLUS XLARGE (GOWN DISPOSABLE) ×2 IMPLANT
GOWN PREVENTION PLUS XXLARGE (GOWN DISPOSABLE) ×2 IMPLANT
NDL HYPO 25X1 1.5 SAFETY (NEEDLE) ×1 IMPLANT
NEEDLE HYPO 25X1 1.5 SAFETY (NEEDLE) ×2 IMPLANT
PACK BASIN DAY SURGERY FS (CUSTOM PROCEDURE TRAY) ×2 IMPLANT
PAD CLEANER CAUTERY TIP 5X5 (MISCELLANEOUS) ×1
PENCIL BUTTON HOLSTER BLD 10FT (ELECTRODE) ×2 IMPLANT
SHEET MEDIUM DRAPE 40X70 STRL (DRAPES) IMPLANT
STOCKINETTE 4X48 STRL (DRAPES) IMPLANT
STRIP CLOSURE SKIN 1/2X4 (GAUZE/BANDAGES/DRESSINGS) ×1 IMPLANT
SUT ETHILON 3 0 PS 1 (SUTURE) IMPLANT
SUT VICRYL 3-0 CR8 SH (SUTURE) ×3 IMPLANT
SUT VICRYL 4-0 PS2 18IN ABS (SUTURE) ×1 IMPLANT
SYR CONTROL 10ML LL (SYRINGE) ×2 IMPLANT
TOWEL OR 17X24 6PK STRL BLUE (TOWEL DISPOSABLE) ×4 IMPLANT
TOWEL OR NON WOVEN STRL DISP B (DISPOSABLE) ×2 IMPLANT
WATER STERILE IRR 1000ML POUR (IV SOLUTION) ×1 IMPLANT

## 2012-08-24 NOTE — Anesthesia Preprocedure Evaluation (Addendum)
Anesthesia Evaluation  Patient identified by MRN, date of birth, ID band Patient awake    Reviewed: Allergy & Precautions, H&P , NPO status , Patient's Chart, lab work & pertinent test results, reviewed documented beta blocker date and time   Airway Mallampati: II TM Distance: >3 FB Neck ROM: Full    Dental No notable dental hx. (+) Teeth Intact and Dental Advisory Given   Pulmonary Current Smoker,  breath sounds clear to auscultation  Pulmonary exam normal       Cardiovascular hypertension, On Medications and On Home Beta Blockers Rhythm:Regular Rate:Normal     Neuro/Psych  Headaches, negative psych ROS   GI/Hepatic Neg liver ROS, GERD-  Medicated and Controlled,  Endo/Other  negative endocrine ROSMorbid obesity  Renal/GU negative Renal ROS  negative genitourinary   Musculoskeletal   Abdominal   Peds  Hematology negative hematology ROS (+)   Anesthesia Other Findings   Reproductive/Obstetrics negative OB ROS                          Anesthesia Physical Anesthesia Plan  ASA: III  Anesthesia Plan: MAC   Post-op Pain Management:    Induction: Intravenous  Airway Management Planned: Simple Face Mask  Additional Equipment:   Intra-op Plan:   Post-operative Plan:   Informed Consent: I have reviewed the patients History and Physical, chart, labs and discussed the procedure including the risks, benefits and alternatives for the proposed anesthesia with the patient or authorized representative who has indicated his/her understanding and acceptance.   Dental advisory given  Plan Discussed with: CRNA  Anesthesia Plan Comments:         Anesthesia Quick Evaluation

## 2012-08-24 NOTE — Transfer of Care (Signed)
Immediate Anesthesia Transfer of Care Note  Patient: [name]  Procedure(s) Performed: Procedure(s) (LRB) with comments: EXCISION MASS (Left) - excisie soft tissue masses left shoulder(back) & left upper arm  Patient Location: PACU  Anesthesia Type:MAC  Level of Consciousness: awake, alert  and oriented  Airway & Oxygen Therapy: Patient Spontanous Breathing and Patient connected to face mask oxygen  Post-op Assessment: Report given to PACU RN, Post -op Vital signs reviewed and stable and Patient moving all extremities  Post vital signs: Reviewed and stable  Complications: No apparent anesthesia complications

## 2012-08-24 NOTE — Anesthesia Procedure Notes (Signed)
Procedure Name: MAC Date/Time: 08/24/2012 1:20 PM Performed by: Meyer Russel Pre-anesthesia Checklist: Patient identified, Emergency Drugs available, Suction available and Patient being monitored Patient Re-evaluated:Patient Re-evaluated prior to inductionOxygen Delivery Method: Simple face mask Preoxygenation: Pre-oxygenation with 100% oxygen

## 2012-08-24 NOTE — H&P (View-Only) (Signed)
General Surgery [facility]  Visit Diagnoses: 1. Neoplasm of soft tissue, left shoulder and left upper arm     HISTORY: Patient is a 51 year old black female known to my practice. She had undergone cholecystectomy in February 2013.  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 The sibling gradually enlarging and causing the patient discomfort. 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 She presents today for surgical evaluation for excision of these tumors.  PERTINENT REVIEW OF SYSTEMS: Patient describes a neurologic type pain across the left shoulder related to the mass. Both masses and gradually enlarged in size. No other cutaneous changes.  EXAM: HEENT: normocephalic; pupils equal and reactive; sclerae clear; dentition good; mucous membranes moist NECK:  symmetric on extension; no palpable anterior or posterior cervical lymphadenopathy; no supraclavicular masses; no tenderness CHEST: clear to auscultation bilaterally without rales, rhonchi, or wheezes CARDIAC: regular rate and rhythm without significant murmur; peripheral pulses are full BACK:  Firm soft tissue mass overlying the left scapula measuring 4 cm x 3 cm x 1 cm in size; there is mild tenderness to palpation EXT:  non-tender without edema; no deformity; there is an approximately 1 x 1 x 1 cm discrete mass below the deltoid muscle on the lateral left upper arm which is mobile and mildly tender NEURO: no gross focal deficits; no sign of tremor   IMPRESSION: #1 soft tissue mass left shoulder, 4 x 3 x 1 cm, 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 We will make arrangements for outpatient surgery. I explained to her the surgical incisions to be anticipated. We discussed  the potential for neurologic symptoms in the left shoulder given the preoperative symptoms that she has been experiencing. She understands.  The risks and benefits of the procedure have been discussed at length with the patient.  The patient understands the proposed procedure, potential alternative treatments, and the course of recovery to be expected.  All of the patient's questions have been answered at this time.  The patient wishes to proceed with surgery.  Velora Heckler, MD, [facility] Office: [phone]

## 2012-08-24 NOTE — Brief Op Note (Signed)
08/24/2012  2:13 PM  PATIENT:  [name]  51 y.o. female  PRE-OPERATIVE DIAGNOSIS:  soft tissue neoplasm left shoulder (5 cm) and left upper arm (1.5 cm)  POST-OPERATIVE DIAGNOSIS:  same  PROCEDURE:  Procedure(s) (LRB) with comments: EXCISION MASS (Left) - excisie soft tissue masses left shoulder(back) & left upper arm  SURGEON:  Surgeon(s) and Role:    * Velora Heckler, MD - Primary  ANESTHESIA:   IV sedation  EBL:  Total I/O In: 300 [I.V.:300] Out: -   BLOOD ADMINISTERED:none  DRAINS: none   LOCAL MEDICATIONS USED:  MARCAINE     SPECIMEN:  Excision  DISPOSITION OF SPECIMEN:  PATHOLOGY  COUNTS:  YES  TOURNIQUET:  * No tourniquets in log *  DICTATION: .Other Dictation: Dictation Number [id number]  PLAN OF CARE: Discharge to home after PACU  PATIENT DISPOSITION:  PACU - hemodynamically stable.   Delay start of Pharmacological VTE agent (>24hrs) due to surgical blood loss or risk of bleeding: yes  Velora Heckler, MD, [facility] Office: [phone]

## 2012-08-24 NOTE — Anesthesia Postprocedure Evaluation (Signed)
  Anesthesia Post-op Note  Patient: [name]  Procedure(s) Performed: Procedure(s) (LRB) with comments: EXCISION MASS (Left) - excisie soft tissue masses left shoulder(back) & left upper arm  Patient Location: PACU  Anesthesia Type:MAC  Level of Consciousness: awake, oriented and patient cooperative  Airway and Oxygen Therapy: Patient Spontanous Breathing  Post-op Pain: mild  Post-op Assessment: Post-op Vital signs reviewed, Patient's Cardiovascular Status Stable, Respiratory Function Stable, Patent Airway, No signs of Nausea or vomiting and Pain level controlled  Post-op Vital Signs: stable  Complications: No apparent anesthesia complications

## 2012-08-24 NOTE — Interval H&P Note (Signed)
History and Physical Interval Note:  08/24/2012 12:56 PM  [name]  has presented today for surgery, with the diagnosis of soft tissue neoplasm with pain, enlargement.  The various methods of treatment have been discussed with the patient and family. After consideration of risks, benefits and other options for treatment, the patient has consented to    Procedure(s) (LRB) with comments: EXCISION MASS (Left) - excisie soft tissue masses left shoulder(back) & left upper arm as a surgical intervention .    The patient's history has been reviewed, patient examined, no change in status, stable for surgery.  I have reviewed the patient's chart and labs.  Questions were answered to the patient's satisfaction.    The risks and benefits of the procedure have been discussed at length with the patient.  The patient understands the proposed procedure, potential alternative treatments, and the course of recovery to be expected.  All of the patient's questions have been answered at this time.  The patient wishes to proceed with surgery.  Velora Heckler, MD, [facility] Office: [phone]    Chesnee Floren Judie Petit

## 2012-08-24 NOTE — OR Nursing (Signed)
Reyne Dumas RN documented on this record until [employer] returned.  [place] RN could not enter chart at this location.

## 2012-08-25 ENCOUNTER — Encounter (HOSPITAL_BASED_OUTPATIENT_CLINIC_OR_DEPARTMENT_OTHER): Payer: Self-pay | Admitting: Surgery

## 2012-08-25 ENCOUNTER — Telehealth (INDEPENDENT_AMBULATORY_CARE_PROVIDER_SITE_OTHER): Payer: Self-pay

## 2012-08-25 NOTE — Progress Notes (Signed)
Quick Note:  Please contact patient and notify of benign pathology results.  Clairissa Valvano M. Anastassia Noack, MD, FACS [facility] Office: [phone]   ______ 

## 2012-08-25 NOTE — Telephone Encounter (Signed)
Pt notified of path result. 

## 2012-08-25 NOTE — Op Note (Signed)
[name], [name]               ACCOUNT NO.:  [id number]  MEDICAL RECORD NO.:  [id number]  LOCATION:                                 FACILITY:  PHYSICIAN:  Velora Heckler, MD      DATE OF BIRTH:  [birth year]  DATE OF PROCEDURE:  08/24/2012                               OPERATIVE REPORT   PREOPERATIVE DIAGNOSES: 1. Soft tissue mass, left shoulder (5 cm). 2. Soft tissue mass, left upper arm (1.5 cm).  POSTOPERATIVE DIAGNOSES: 1. Soft tissue mass, left shoulder (5 cm). 2. Soft tissue mass, left upper arm (1.5 cm).  PROCEDURE: 1. Excision soft tissue mass, left shoulder (4 x 5 x 2 cm). 2. Excision soft tissue mass, left upper arm (1.5 x 1 x 1 cm).  SURGEON:  Velora Heckler, MD, FACS  ANESTHESIA:  Local with intravenous sedation.  PREPARATION:  ChloraPrep.  COMPLICATIONS:  None.  BLOOD LOSS:  Minimal.  INDICATIONS:  The patient is a 51 year old, black female, known to my surgical service.  She presents with enlarging soft tissue mass is overlying the left scapula and at the base of the left deltoid muscle. These are causing her discomfort.  She now comes to surgery for excision.  BODY OF REPORT:  Procedures done in OR #2 at the [facility].  Patient was brought to the operating room, placed in a right lateral decubitus position on the operating room table.  Following administration of intravenous sedation, the patient was positioned and then prepped and draped in the usual strict aseptic fashion.  After ascertaining, an adequate level of sedation had been obtained, the area overlying the mass on the left scapula was infiltrated with local anesthetic.  A 4-cm incision was made over the mass.  Subcutaneous flaps were developed circumferentially.  Mass was localized.  It extends down to the underlying musculature.  Using the electrocautery for hemostasis, the entire mass was excised.  It measures 4 x 5 x 2 cm in dimension.  It appears to be benign fatty tissue.   It will be submitted in its entirety to pathology for review.  Good hemostasis was obtained throughout the wound.  The skin was closed with interrupted 3-0 Vicryl subcuticular sutures.  Wound was washed and dried.  Benzoin and Steri-Strips were applied.  Sterile dressings were applied.  Next, we turned our attention to the left upper arm.  The mass at the base of the deltoid muscle has been previously marked on the skin.  Skin was prepped with ChloraPrep and sterile drapes were applied.  The skin was anesthetized with local anesthetic.  A 2-cm incision was made over the mass.  Dissection was carried down to the mass where it was identified, mobilized, and excised using the electrocautery for hemostasis.  It measures 1.5 x 1 x 1 cm, and it is submitted in its entirety to pathology for review.  The skin was closed with interrupted 4-0 Vicryl subcuticular sutures.  Wound was washed and dried and, benzoin and Steri-Strips were applied.  Sterile dressings were applied. Patient was awakened from anesthesia and brought to the recovery room. The patient tolerated the procedure well.   Velora Heckler,  MD, [facility] Office: [phone]    TMG/MEDQ  D:  08/24/2012  T:  08/25/2012  Job:  [id number]

## 2012-09-06 ENCOUNTER — Encounter (INDEPENDENT_AMBULATORY_CARE_PROVIDER_SITE_OTHER): Payer: Self-pay | Admitting: Surgery

## 2012-09-06 ENCOUNTER — Ambulatory Visit (INDEPENDENT_AMBULATORY_CARE_PROVIDER_SITE_OTHER): Payer: 59 | Admitting: Surgery

## 2012-09-06 VITALS — BP 166/98 | HR 78 | Temp 97.8°F | Resp 20 | Ht 67.0 in | Wt 273.0 lb

## 2012-09-06 DIAGNOSIS — D492 Neoplasm of unspecified behavior of bone, soft tissue, and skin: Secondary | ICD-10-CM

## 2012-09-06 NOTE — Patient Instructions (Signed)
  COCOA BUTTER & VITAMIN E CREAM  ([employer] or other brand)  Apply cocoa butter/vitamin E cream to your incision 2 - 3 times daily.  Massage cream into incision for one minute with each application.  Use sunscreen (50 SPF or higher) for first 6 months after surgery if area is exposed to sun.  You may substitute Mederma or other scar reducing creams as desired.   

## 2012-09-06 NOTE — Progress Notes (Signed)
General Surgery [facility]  Visit Diagnoses: 1. Neoplasm of soft tissue, left shoulder and left upper arm     HISTORY: Patient returns for her first postoperative visit having undergone excision of soft tissue masses from the left shoulder and left upper arm. Final pathology shows the shoulder lesion to measure 5.7 cm in diameter and be consistent with a benign lipoma. Likewise the lesion in the left upper arm measured 1.5 cm and was also consistent with a benign lipoma.  EXAM: Surgical incisions are healing nicely. Small seroma on the left shoulder. No sign of infection on either wound.  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 She will return for follow-up as needed.  Diane Heckler, MD, FACS General & Endocrine Surgery [facility]

## 2012-10-13 ENCOUNTER — Telehealth: Payer: Self-pay | Admitting: Family Medicine

## 2012-10-13 MED ORDER — OMEPRAZOLE 40 MG PO CPDR: 40.0000 mg | DELAYED_RELEASE_CAPSULE | Freq: Every day | ORAL | Status: DC

## 2012-10-13 NOTE — Telephone Encounter (Signed)
Refill request for Omeprazole 40 mg and I did send script e-scribe.

## 2012-10-26 ENCOUNTER — Encounter: Payer: Self-pay | Admitting: Family Medicine

## 2012-10-26 ENCOUNTER — Ambulatory Visit (INDEPENDENT_AMBULATORY_CARE_PROVIDER_SITE_OTHER): Payer: 59 | Admitting: Family Medicine

## 2012-10-26 VITALS — BP 148/90 | HR 78 | Temp 97.9°F | Wt 272.0 lb

## 2012-10-26 DIAGNOSIS — J329 Chronic sinusitis, unspecified: Secondary | ICD-10-CM

## 2012-10-26 MED ORDER — AZITHROMYCIN 250 MG PO TABS: ORAL_TABLET | ORAL | Status: DC

## 2012-10-26 NOTE — Progress Notes (Signed)
  Subjective:    Patient ID: [name], female    DOB: 1961-08-03, 52 y.o.   MRN: [id number]  HPI Here for one week of sinus pressure, HA, PND, and a ST. No cough or fever.    Review of Systems  Constitutional: Negative.   HENT: Positive for congestion, postnasal drip and sinus pressure.   Eyes: Negative.   Respiratory: Positive for cough.        Objective:   Physical Exam  Constitutional: She appears well-developed and well-nourished.  HENT:  Right Ear: External ear normal.  Left Ear: External ear normal.  Nose: Nose normal.  Mouth/Throat: Oropharynx is clear and moist.  Eyes: Conjunctivae normal are normal.  Pulmonary/Chest: Effort normal and breath sounds normal.  Lymphadenopathy:    She has no cervical adenopathy.          Assessment & Plan:  Add Mucinex.

## 2012-11-19 DIAGNOSIS — Z0279 Encounter for issue of other medical certificate: Secondary | ICD-10-CM

## 2012-12-13 ENCOUNTER — Ambulatory Visit (INDEPENDENT_AMBULATORY_CARE_PROVIDER_SITE_OTHER): Payer: 59 | Admitting: Family Medicine

## 2012-12-13 ENCOUNTER — Encounter: Payer: Self-pay | Admitting: Family Medicine

## 2012-12-13 VITALS — BP 170/100 | HR 88 | Temp 99.4°F | Wt 275.0 lb

## 2012-12-13 DIAGNOSIS — R1013 Epigastric pain: Secondary | ICD-10-CM

## 2012-12-13 DIAGNOSIS — R109 Unspecified abdominal pain: Secondary | ICD-10-CM

## 2012-12-13 LAB — POCT URINALYSIS DIPSTICK
Bilirubin, UA: NEGATIVE
Glucose, UA: NEGATIVE
Ketones, UA: NEGATIVE
Leukocytes, UA: NEGATIVE
Nitrite, UA: NEGATIVE
Spec Grav, UA: 1.015
Urobilinogen, UA: 0.2
pH, UA: 6.5

## 2012-12-13 MED ORDER — OMEPRAZOLE 40 MG PO CPDR: 40.0000 mg | DELAYED_RELEASE_CAPSULE | Freq: Two times a day (BID) | ORAL | Status: DC

## 2012-12-13 NOTE — Progress Notes (Signed)
  Subjective:    Patient ID: [name], female    DOB: 09/03/1961, 52 y.o.   MRN: [id number]  HPI Here for 2 weeks of epigastric pains with nausea but no vomiting. No fever. No UTI symptoms. BMs regular. Taking Omeprazole once a day. She admits to a lot of stress lately.    Review of Systems  Constitutional: Negative.   Respiratory: Negative.   Cardiovascular: Negative.   Gastrointestinal: Positive for nausea and abdominal pain. Negative for vomiting, diarrhea, constipation, blood in stool and abdominal distention.  Genitourinary: Negative.        Objective:   Physical Exam  Constitutional: She appears well-developed and well-nourished.  Cardiovascular: Normal rate, regular rhythm, normal heart sounds and intact distal pulses.   Pulmonary/Chest: Effort normal and breath sounds normal.  Abdominal: Soft. Bowel sounds are normal. She exhibits no distension and no mass. There is no rebound and no guarding.  Mildly tender in the epigastrium           Assessment & Plan:  Probable duodenitis. Increase Omeprazole to bid

## 2012-12-29 ENCOUNTER — Other Ambulatory Visit: Payer: Self-pay | Admitting: Family Medicine

## 2012-12-31 ENCOUNTER — Telehealth: Payer: Self-pay | Admitting: Family Medicine

## 2012-12-31 NOTE — Telephone Encounter (Signed)
PT called and stated that the pharmacy must have misplaced her RX for metoprolol (LOPRESSOR) 50 MG tablet, because it is still not there. Please assist.

## 2012-12-31 NOTE — Telephone Encounter (Signed)
I called in below script and spoke with pt. 

## 2013-01-10 ENCOUNTER — Ambulatory Visit (INDEPENDENT_AMBULATORY_CARE_PROVIDER_SITE_OTHER): Payer: 59 | Admitting: Family Medicine

## 2013-01-10 ENCOUNTER — Encounter: Payer: Self-pay | Admitting: Family Medicine

## 2013-01-10 VITALS — BP 160/100 | HR 69 | Temp 98.6°F | Wt 275.0 lb

## 2013-01-10 DIAGNOSIS — R1013 Epigastric pain: Secondary | ICD-10-CM

## 2013-01-10 DIAGNOSIS — I10 Essential (primary) hypertension: Secondary | ICD-10-CM

## 2013-01-10 DIAGNOSIS — R609 Edema, unspecified: Secondary | ICD-10-CM

## 2013-01-10 MED ORDER — FUROSEMIDE 20 MG PO TABS: 20.0000 mg | ORAL_TABLET | Freq: Every day | ORAL | Status: DC

## 2013-01-10 MED ORDER — TEMAZEPAM 30 MG PO CAPS: 30.0000 mg | ORAL_CAPSULE | Freq: Every evening | ORAL | Status: DC | PRN

## 2013-01-10 NOTE — Progress Notes (Signed)
  Subjective:    Patient ID: [name], female    DOB: 1961-07-19, 52 y.o.   MRN: [id number]  HPI Here for continued epigastric pains and other issues. She is still nauseated but has not vomited. At our suggestion she has increased the Omeprazole to bid, but this has not helped. Her BMs are regular. She needs refills on temazepam, which still works well for her. She also has some swelling in the hands and feet, and her BP remains a bit elevated.    Review of Systems  Constitutional: Negative.   Respiratory: Negative.   Cardiovascular: Positive for leg swelling. Negative for chest pain and palpitations.  Gastrointestinal: Positive for nausea and abdominal pain. Negative for vomiting, diarrhea, constipation, blood in stool and abdominal distention.       Objective:   Physical Exam  Constitutional: She appears well-developed and well-nourished.  Neck: No thyromegaly present.  Cardiovascular: Normal rate, regular rhythm, normal heart sounds and intact distal pulses.   Pulmonary/Chest: Effort normal and breath sounds normal.  Abdominal: Bowel sounds are normal. She exhibits no distension and no mass. There is no tenderness. There is no rebound and no guarding.  Musculoskeletal: She exhibits edema.  Lymphadenopathy:    She has no cervical adenopathy.          Assessment & Plan:  Add Lasix to her meds for the BP and the edema. Refer to GI for the abdominal pain.

## 2013-01-14 ENCOUNTER — Other Ambulatory Visit: Payer: Self-pay | Admitting: Family Medicine

## 2013-01-20 ENCOUNTER — Encounter: Payer: Self-pay | Admitting: Internal Medicine

## 2013-01-20 ENCOUNTER — Ambulatory Visit (INDEPENDENT_AMBULATORY_CARE_PROVIDER_SITE_OTHER): Payer: 59 | Admitting: Internal Medicine

## 2013-01-20 VITALS — BP 146/100 | HR 72 | Ht 66.54 in | Wt 274.5 lb

## 2013-01-20 DIAGNOSIS — K219 Gastro-esophageal reflux disease without esophagitis: Secondary | ICD-10-CM

## 2013-01-20 DIAGNOSIS — K5730 Diverticulosis of large intestine without perforation or abscess without bleeding: Secondary | ICD-10-CM

## 2013-01-20 DIAGNOSIS — R1084 Generalized abdominal pain: Secondary | ICD-10-CM

## 2013-01-20 DIAGNOSIS — Z86010 Personal history of colonic polyps: Secondary | ICD-10-CM

## 2013-01-20 MED ORDER — MOVIPREP 100 G PO SOLR: 1.0000 | Freq: Once | ORAL | Status: DC

## 2013-01-20 NOTE — Progress Notes (Signed)
HISTORY OF PRESENT ILLNESS:  [name] is a 52 y.o. female with hypertension, osteoarthritis, obesity, GERD, chronic back pain for which she is followed at the pain clinic, status post cholecystectomy, and advanced adenomatous polyps. Patient presents today, upon referral from her primary provider, regarding mid abdominal pain. She was seen in November 2011 regarding screening colonoscopy. At that time she did complain of left lower quadrant pain. She subsequently underwent complete colonoscopy 08/20/2010. 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 Followup in 3 years recommended. In the interim, she underwent cholecystectomy for symptomatic gallstones February 2013. Current complaints are sharp somewhat constant mid abdominal pain generally lasting about one hour 3-4 times per week. This has been going on for about 3-4 months. She does not notice that the discomfort is affected by meals, activity, or bowel movements. She has had increased frequency of bowel movements, 4-5 per day, since cholecystectomy. No bleeding. No significant change in appetite or weight. For GERD, she is maintained on Prilosec. For the most part, good control. Occasional nausea. GI review of systems is otherwise negative. The patient is concerned. There is a family history of gastric cancer. She is also status post hysterectomy.  REVIEW OF SYSTEMS:  All non-GI ROS negative except for back pain, arthritis, headaches, night sweats, ankle edema, insomnia .Marland Kitchen  Past Medical History  Diagnosis Date  . Osteoarthritis   . Headache     migraine like per patient  . Hypertension   . Low back pain     dr phillips, pain management  . Degenerative joint disease   . Gallstones   . GERD (gastroesophageal reflux disease)   . DDD (degenerative disc disease), cervical   . Colon polyps   . Diverticulosis     Past Surgical History   Procedure Laterality Date  . Replacement total knee Right     right - dr Margreta Journey  . Ganglion cyst excision Right     right wrist  . Lipoma excision Right 02/21/08    right hip, [facility]  . Replacement total knee Left 06/25/09    left - dr Merrilee Seashore [facility]  . Tonsillectomy    . Joint replacement      both knees have been done, four times each per patient. Her body rejects the implant, no infection.  . Abdominal hysterectomy  06/22/06    lap, dr Su Hilt  . Other surgical history      lipoma removed from back 2012   . Cholecystectomy  10/28/2011    Procedure: LAPAROSCOPIC CHOLECYSTECTOMY WITH INTRAOPERATIVE CHOLANGIOGRAM;  Surgeon: Velora Heckler, MD;  Location: [facility];  Service: General;  Laterality: N/A;  . Mass excision  08/24/2012    Procedure: EXCISION MASS;  Surgeon: Velora Heckler, MD;  Location: [facility];  Service: General;  Laterality: Left;  excisie soft tissue masses left shoulder(back) & left upper arm  . Colonoscopy  08-20-10    per Dr. Marina Goodell, polyps, repeat in 3 yrs     Social History [name]  reports that she has been smoking Cigarettes.  She has been smoking about 0.00 packs per day for the past 20 years. She has never used smokeless tobacco. She reports that she does not drink alcohol or use illicit drugs.  family history includes Crohn's disease in her other; Heart disease in her father; Hypertension in her brother and sister; Kidney cancer in her sister; and  Stomach cancer in her mother.  Allergies  Allergen Reactions  . Codeine Itching and Nausea And Vomiting    Itching all over the body  . Sulfonamide Derivatives Hives    All over the body  . Latex Hives       PHYSICAL EXAMINATION: Vital signs: BP 146/100  Pulse 72  Ht 5' 6.54" (1.69 m)  Wt 274 lb 8 oz (124.512 kg)  BMI 43.6 kg/m2  Constitutional:  Obese, somewhat unhealthy -appearing, no acute distress Psychiatric: alert and oriented x3, cooperative Eyes: extraocular movements intact,  anicteric, conjunctiva pink Mouth: oral pharynx moist, no lesions Neck: supple no lymphadenopathy Cardiovascular: heart regular rate and rhythm, no murmur Lungs: clear to auscultation bilaterally Abdomen: soft, obese, mild mid abdominal tenderness to palpation , nondistended, no obvious ascites, no peritoneal signs, normal bowel sounds, no organomegaly Rectal: Deferred Extremities: no lower extremity edema bilaterally Skin: no lesions on visible extremities Neuro: No focal deficits. No asterixis   ASSESSMENT:  #1. 3-4 month history of abdominal pain. Etiology unclear. Rule out ulcer disease. Rule out intracolonic disease. Rule out spasm #2. GERD. Stable on PPI #3. History of adenomatous colon polyps with advanced adenoma. Last colonoscopy 2011. Due for colonoscopy this year #4. No diverticulosis #5. Multiple general medical problems. Stable  PLAN:  #1. Diagnostic upper endoscopy.The nature of the procedure, as well as the risks, benefits, and alternatives were carefully and thoroughly reviewed with the patient. Ample time for discussion and questions allowed. The patient understood, was satisfied, and agreed to proceed. #2. Continue reflux precautions #3. Continue PPI #4. Surveillance colonoscopy.The nature of the procedure, as well as the risks, benefits, and alternatives were carefully and thoroughly reviewed with the patient. Ample time for discussion and questions allowed. The patient understood, was satisfied, and agreed to proceed. #5. Movi prep prescribed. The patient was instructed on its use #6. If the workup for her pain is negative, then prescribe antispasmodic for possible colon spasm pain

## 2013-01-20 NOTE — Patient Instructions (Addendum)

## 2013-01-21 ENCOUNTER — Encounter: Payer: Self-pay | Admitting: Internal Medicine

## 2013-02-03 ENCOUNTER — Ambulatory Visit (AMBULATORY_SURGERY_CENTER): Payer: 59 | Admitting: Internal Medicine

## 2013-02-03 ENCOUNTER — Encounter: Payer: Self-pay | Admitting: Internal Medicine

## 2013-02-03 VITALS — BP 152/77 | HR 64 | Temp 98.9°F | Resp 17 | Ht 66.5 in | Wt 274.0 lb

## 2013-02-03 DIAGNOSIS — Z86010 Personal history of colonic polyps: Secondary | ICD-10-CM

## 2013-02-03 DIAGNOSIS — R1084 Generalized abdominal pain: Secondary | ICD-10-CM

## 2013-02-03 DIAGNOSIS — Z1211 Encounter for screening for malignant neoplasm of colon: Secondary | ICD-10-CM

## 2013-02-03 DIAGNOSIS — K219 Gastro-esophageal reflux disease without esophagitis: Secondary | ICD-10-CM

## 2013-02-03 HISTORY — PX: COLONOSCOPY WITH ESOPHAGOGASTRODUODENOSCOPY (EGD): SHX5779

## 2013-02-03 MED ORDER — HYOSCYAMINE SULFATE 0.125 MG SL SUBL: 0.1250 mg | SUBLINGUAL_TABLET | SUBLINGUAL | Status: DC | PRN

## 2013-02-03 MED ORDER — SODIUM CHLORIDE 0.9 % IV SOLN: 500.0000 mL | INTRAVENOUS | Status: DC

## 2013-02-03 NOTE — Progress Notes (Signed)
Patient did not experience any of the following events: a burn prior to discharge; a fall within the facility; wrong site/side/patient/procedure/implant event; or a hospital transfer or hospital admission upon discharge from the facility. (G8907) Patient did not have preoperative order for IV antibiotic SSI prophylaxis. (G8918)  

## 2013-02-03 NOTE — Patient Instructions (Addendum)

## 2013-02-03 NOTE — Op Note (Signed)
[facility] [address] [place], [address]   COLONOSCOPY PROCEDURE REPORT  PATIENT: [name], [name]  MR#: [id number] BIRTHDATE: 01-29-1961 , 52  yrs. old GENDER: Female ENDOSCOPIST: Roxy Cedar, MD REFERRED BY:.  Self / Office PROCEDURE DATE:  02/03/2013 PROCEDURE:   Colonoscopy, surveillance ASA CLASS:   Class II INDICATIONS:Patient's personal history of adenomatous colon polyps.Index 2011 with multiple and advanced (TVA) MEDICATIONS: MAC sedation, administered by CRNA and propofol (Diprivan) 330mg  IV  DESCRIPTION OF PROCEDURE:   After the risks benefits and alternatives of the procedure were thoroughly explained, informed consent was obtained.  A digital rectal exam revealed no abnormalities of the rectum.   The LB [id number]  endoscope was introduced through the anus and advanced to the cecum, which was identified by both the appendix and ileocecal valve. No adverse events experienced.   The quality of the prep was excellent, using MoviPrep  The instrument was then slowly withdrawn as the colon was fully examined.      COLON FINDINGS: Moderate diverticulosis was noted The finding was in the left colon.   The colon was otherwise normal.  There was no inflammation, polyps or cancers .  Retroflexed views revealed internal hemorrhoids. The time to cecum=3 minutes 43 seconds. Withdrawal time=11 minutes 58 seconds.  The scope was withdrawn and the procedure completed.  COMPLICATIONS: There were no complications.  ENDOSCOPIC IMPRESSION: 1.   Moderate diverticulosis was noted in the left colon 2.   The colon was otherwise normal  RECOMMENDATIONS: 1.  Follow up colonoscopy in 5 years 2.  Upper endoscopy today (see report)   eSigned:  Roxy Cedar, MD 02/03/2013 2:29 PM   cc: Nelwyn Salisbury, MD and The Patient

## 2013-02-03 NOTE — Op Note (Signed)
[facility] [address] [place], [address]   ENDOSCOPY PROCEDURE REPORT  PATIENT: [name], [name]  MR#: [id number] BIRTHDATE: 02-Feb-1961 , 52  yrs. old GENDER: Female ENDOSCOPIST: Roxy Cedar, MD REFERRED BY:  .  Self / Office PROCEDURE DATE:  02/03/2013 PROCEDURE:  EGD, diagnostic ASA CLASS:     Class II INDICATIONS:  abdominal pain. MEDICATIONS: MAC sedation, administered by CRNA and propofol (Diprivan) 200mg  IV TOPICAL ANESTHETIC: none  DESCRIPTION OF PROCEDURE: After the risks benefits and alternatives of the procedure were thoroughly explained, informed consent was obtained.  The LB [id number] endoscope was introduced through the mouth and advanced to the second portion of the duodenum. Without limitations.  The instrument was slowly withdrawn as the mucosa was fully examined.      The upper, middle and distal third of the esophagus were carefully inspected and no abnormalities were noted.  The z-line was well seen at the GEJ.  The endoscope was pushed into the fundus which was normal including a retroflexed view.  The antrum, gastric body, first and second part of the duodenum were unremarkable. Retroflexed views revealed no abnormalities.     The scope was then withdrawn from the patient and the procedure completed.  COMPLICATIONS: There were no complications. ENDOSCOPIC IMPRESSION: 1. Normal EGD 2. GERD 3. Suspect abdominal discomfort related to spasm  RECOMMENDATIONS: 1.Continue omeprazole 2. Prescribe Levsin sublingual 0.125 mg, dispense 60, 1-2 sublingual every 4-6 hours as needed for pain; 4 refills 3. GI followup as needed. Return to the care of Dr. Clent Ridges  REPEAT EXAM:  eSigned:  Roxy Cedar, MD 02/03/2013 2:33 PM  QI:ONGEXBM Marguerita Beards, MD and The Patient

## 2013-02-04 ENCOUNTER — Telehealth: Payer: Self-pay | Admitting: *Deleted

## 2013-02-04 NOTE — Telephone Encounter (Signed)
  Follow up Call-  Call back number 02/03/2013  Post procedure Call Back phone  # [phone]  Permission to leave phone message Yes     Patient questions:  Do you have a fever, pain , or abdominal swelling? no Pain Score  0 *  Have you tolerated food without any problems? yes  Have you been able to return to your normal activities? yes  Do you have any questions about your discharge instructions: Diet   no Medications  no Follow up visit  no  Do you have questions or concerns about your Care? no  Actions: * If pain score is 4 or above: No action needed, pain <4.

## 2013-02-17 ENCOUNTER — Encounter: Payer: Self-pay | Admitting: Nurse Practitioner

## 2013-02-17 ENCOUNTER — Ambulatory Visit (INDEPENDENT_AMBULATORY_CARE_PROVIDER_SITE_OTHER): Payer: 59 | Admitting: Nurse Practitioner

## 2013-02-17 ENCOUNTER — Telehealth: Payer: Self-pay | Admitting: Internal Medicine

## 2013-02-17 ENCOUNTER — Other Ambulatory Visit (INDEPENDENT_AMBULATORY_CARE_PROVIDER_SITE_OTHER): Payer: 59

## 2013-02-17 VITALS — BP 160/100 | HR 88 | Ht 67.0 in | Wt 275.0 lb

## 2013-02-17 DIAGNOSIS — R109 Unspecified abdominal pain: Secondary | ICD-10-CM

## 2013-02-17 DIAGNOSIS — K648 Other hemorrhoids: Secondary | ICD-10-CM

## 2013-02-17 LAB — CBC WITH DIFFERENTIAL/PLATELET
Basophils Absolute: 0 10*3/uL (ref 0.0–0.1)
Basophils Relative: 0.3 % (ref 0.0–3.0)
Eosinophils Absolute: 0.1 10*3/uL (ref 0.0–0.7)
Eosinophils Relative: 1.4 % (ref 0.0–5.0)
HCT: 40.6 % (ref 36.0–46.0)
Hemoglobin: 14 g/dL (ref 12.0–15.0)
Lymphocytes Relative: 41.9 % (ref 12.0–46.0)
Lymphs Abs: 3.5 10*3/uL (ref 0.7–4.0)
MCHC: 34.5 g/dL (ref 30.0–36.0)
MCV: 86.7 fl (ref 78.0–100.0)
Monocytes Absolute: 0.3 10*3/uL (ref 0.1–1.0)
Monocytes Relative: 4.1 % (ref 3.0–12.0)
Neutro Abs: 4.4 10*3/uL (ref 1.4–7.7)
Neutrophils Relative %: 52.3 % (ref 43.0–77.0)
Platelets: 332 10*3/uL (ref 150.0–400.0)
RBC: 4.68 Mil/uL (ref 3.87–5.11)
RDW: 14.1 % (ref 11.5–14.6)
WBC: 8.4 10*3/uL (ref 4.5–10.5)

## 2013-02-17 MED ORDER — HYDROCORTISONE ACETATE 25 MG RE SUPP: RECTAL | Status: DC

## 2013-02-17 NOTE — Patient Instructions (Addendum)
Please go to the basement level to have your labs drawn.  We will call you with the results. We sent the prescription for the [facility] Suppositories to [facility].

## 2013-02-17 NOTE — Telephone Encounter (Signed)
Pt states that yesterday she had dark stools and this morning her stool had some pink blood in it. Pt recently had egd and colon and states everything was fine. Pt scheduled to see Willette Cluster NP today at 2pm. Pt aware of appt date and time.

## 2013-02-17 NOTE — Progress Notes (Signed)
History of Present Illness:  Patient is a 52 year old female known to Dr. Marina Goodell for a history of GERD and advanced adenomatous polyps (tubulovillous polyp 2011). Patient was seen at the beginning of this month for evaluation of abdominal pain. She subsequently underwent EGD for evaluation. Her surveillance colonoscopy was done at the same time. EGD was normal. Colonoscopy revealed only left sided diverticular disease. Levsin has been helping abdominal pain though not really helping over last couple of days since bleeding began.   Patient presents today for evaluation of rectal bleeding. She reports frequent loose BMs since cholecystectomy. She has been passing bright red blood with BMs since Tuesday. Today she hasn't had BM or bleeding. She complains of diffuse abdominal pain, cannot say definitively whether pain same as she described at last visit or different. Description of pain is vague but it doesn't seem to correlate with meals.   Current Medications, Allergies, Past Medical History, Past Surgical History, Family History and Social History were reviewed in [employer] record.  Physical Exam: General: pleasant, obese black female in no acute distress Head: Normocephalic and atraumatic Eyes:  sclerae anicteric, conjunctiva pink  Ears: Normal auditory acuity Lungs: Clear throughout to auscultation Heart: Regular rate and rhythm Abdomen: Soft, obese, mild diffuse tenderness. Normal bowel sounds.  Rectal: no external lesions. On anoscopy in left sims there was an inflamed hemorrhoid at around 3 0'clock Neurological: Alert oriented x 4, grossly nonfocal Psychological:  Alert and cooperative. Normal mood and affect  Assessment and Recommendations: Rectal bleeding. Having abdominal pain but that has been more of chronic problem. She is s/p complete colonoscopy on 02/03/13 with findings of only diverticulosis. She has an internal hemorrhoid on anoscopy, likely source of recent  bleeding. No bleeding or BMs today. Doubt significant bleed but will check CBC today. Will treat with daily Anusol HC suppositories for one week. Patient to call for recurrent bleeding.

## 2013-02-17 NOTE — Progress Notes (Signed)
Agree 

## 2013-02-27 ENCOUNTER — Emergency Department (HOSPITAL_COMMUNITY): Payer: 59

## 2013-02-27 ENCOUNTER — Encounter (HOSPITAL_COMMUNITY): Payer: Self-pay | Admitting: *Deleted

## 2013-02-27 ENCOUNTER — Emergency Department (HOSPITAL_COMMUNITY)
Admission: EM | Admit: 2013-02-27 | Discharge: 2013-02-28 | Disposition: A | Discharge status: Home / Self Care | Payer: 59 | Attending: Emergency Medicine | Admitting: Emergency Medicine

## 2013-02-27 DIAGNOSIS — M1990 Unspecified osteoarthritis, unspecified site: Secondary | ICD-10-CM | POA: Insufficient documentation

## 2013-02-27 DIAGNOSIS — Z91040 Latex allergy status: Secondary | ICD-10-CM | POA: Insufficient documentation

## 2013-02-27 DIAGNOSIS — K219 Gastro-esophageal reflux disease without esophagitis: Secondary | ICD-10-CM | POA: Insufficient documentation

## 2013-02-27 DIAGNOSIS — R079 Chest pain, unspecified: Secondary | ICD-10-CM

## 2013-02-27 DIAGNOSIS — R0789 Other chest pain: Secondary | ICD-10-CM | POA: Insufficient documentation

## 2013-02-27 DIAGNOSIS — I10 Essential (primary) hypertension: Secondary | ICD-10-CM | POA: Insufficient documentation

## 2013-02-27 DIAGNOSIS — F17200 Nicotine dependence, unspecified, uncomplicated: Secondary | ICD-10-CM | POA: Insufficient documentation

## 2013-02-27 DIAGNOSIS — Z8739 Personal history of other diseases of the musculoskeletal system and connective tissue: Secondary | ICD-10-CM | POA: Insufficient documentation

## 2013-02-27 DIAGNOSIS — Z79899 Other long term (current) drug therapy: Secondary | ICD-10-CM | POA: Insufficient documentation

## 2013-02-27 DIAGNOSIS — Z8719 Personal history of other diseases of the digestive system: Secondary | ICD-10-CM | POA: Insufficient documentation

## 2013-02-27 DIAGNOSIS — E876 Hypokalemia: Secondary | ICD-10-CM | POA: Insufficient documentation

## 2013-02-27 LAB — CBC
HCT: 37.1 % (ref 36.0–46.0)
Hemoglobin: 12.7 g/dL (ref 12.0–15.0)
MCH: 29.7 pg (ref 26.0–34.0)
MCHC: 34.2 g/dL (ref 30.0–36.0)
MCV: 86.9 fL (ref 78.0–100.0)
Platelets: 292 10*3/uL (ref 150–400)
RBC: 4.27 MIL/uL (ref 3.87–5.11)
RDW: 14 % (ref 11.5–15.5)
WBC: 8.9 10*3/uL (ref 4.0–10.5)

## 2013-02-27 LAB — BASIC METABOLIC PANEL
BUN: 13 mg/dL (ref 6–23)
CO2: 28 mEq/L (ref 19–32)
Calcium: 9.3 mg/dL (ref 8.4–10.5)
Chloride: 103 mEq/L (ref 96–112)
Creatinine, Ser: 0.97 mg/dL (ref 0.50–1.10)
GFR calc Af Amer: 76 mL/min — ABNORMAL LOW (ref >90)
GFR calc non Af Amer: 66 mL/min — ABNORMAL LOW (ref >90)
Glucose, Bld: 142 mg/dL — ABNORMAL HIGH (ref 70–99)
Potassium: 3 mEq/L — ABNORMAL LOW (ref 3.5–5.1)
Sodium: 141 mEq/L (ref 135–145)

## 2013-02-27 LAB — POCT I-STAT TROPONIN I: Troponin i, poc: 0.01 ng/mL (ref 0.00–0.08)

## 2013-02-27 MED ORDER — NITROGLYCERIN 0.4 MG SL SUBL
0.4000 mg | SUBLINGUAL_TABLET | SUBLINGUAL | Status: DC | PRN
  Administered 2013-02-27 (×2): 0.4 mg via SUBLINGUAL
  Filled 2013-02-27: qty 25

## 2013-02-27 MED ORDER — ASPIRIN 325 MG PO TABS
325.0000 mg | ORAL_TABLET | ORAL | Status: AC
  Administered 2013-02-27: 325 mg via ORAL
  Filled 2013-02-27: qty 1

## 2013-02-27 MED ORDER — POTASSIUM CHLORIDE CRYS ER 20 MEQ PO TBCR
60.0000 meq | EXTENDED_RELEASE_TABLET | Freq: Once | ORAL | Status: AC
  Administered 2013-02-27: 60 meq via ORAL
  Filled 2013-02-27: qty 3

## 2013-02-27 NOTE — ED Notes (Signed)
MD at bedside. Dr. Lockwood at bedside.  

## 2013-02-27 NOTE — ED Notes (Signed)
Pt state pain is not getting any better and now she has a headache. Pain is 6/10 to L anterior chest.

## 2013-02-27 NOTE — ED Notes (Signed)
Pt developed chest pain since about 1430 today, tried to rest without relief, has had symptoms like this with a low Potassium before, reprtst dizziness with symptoms, pain on left chest

## 2013-02-27 NOTE — ED Notes (Signed)
Pt states pain to L anterior chest is slightly decreased after first NTG SL 0.4mg . VSS.

## 2013-02-27 NOTE — ED Provider Notes (Signed)
History     CSN: [id number]  Arrival date & time 02/27/13  1949   First MD Initiated Contact with Patient 02/27/13 2044      Chief Complaint  Patient presents with  . Chest Pain    HPI  Patient presents with chest pain, generalized discomfort. Pain began approximately 5 hours prior to my evaluation, without clear precipitant.  Since onset she is at superior left-sided chest pain, nonradiating, sore, severe. No relief with anything, and no pleuritic or exertional changes. Associated discomfort is more appropriately described as fatigue and generalized weakness. No focal discoordination, unilateral weakness, confusion, disorientation, nausea, vomiting, diarrhea, incontinence. She states that she is one prior similar episode, associated with hypokalemia.   Past Medical History  Diagnosis Date  . Osteoarthritis   . Headache(784.0)     migraine like per patient  . Hypertension   . Low back pain     dr phillips, pain management  . Degenerative joint disease   . Gallstones   . GERD (gastroesophageal reflux disease)   . DDD (degenerative disc disease), cervical   . Colon polyps   . Diverticulosis     Past Surgical History  Procedure Laterality Date  . Replacement total knee Right     right - dr Margreta Journey  . Ganglion cyst excision Right     right wrist  . Lipoma excision Right 02/21/08    right hip, [facility]  . Replacement total knee Left 06/25/09    left - dr Merrilee Seashore [facility]  . Tonsillectomy    . Joint replacement      both knees have been done, four times each per patient. Her body rejects the implant, no infection.  . Abdominal hysterectomy  06/22/06    lap, dr Su Hilt  . Other surgical history      lipoma removed from back 2012   . Cholecystectomy  10/28/2011    Procedure: LAPAROSCOPIC CHOLECYSTECTOMY WITH INTRAOPERATIVE CHOLANGIOGRAM;  Surgeon: Velora Heckler, MD;  Location: [facility];  Service: General;  Laterality: N/A;  . Mass excision  08/24/2012    Procedure: EXCISION MASS;   Surgeon: Velora Heckler, MD;  Location: [facility];  Service: General;  Laterality: Left;  excisie soft tissue masses left shoulder(back) & left upper arm  . Colonoscopy  08-20-10    per Dr. Marina Goodell, polyps, repeat in 3 yrs     Family History  Problem Relation Age of Onset  . Crohn's disease Other     nephew  . Stomach cancer Mother   . Heart disease Father   . Kidney cancer Sister     one out of the four  . Hypertension Sister   . Hypertension Brother   . Colon cancer Maternal Grandmother     History  Substance Use Topics  . Smoking status: Current Every Day Smoker -- 0.25 packs/day for 20 years    Types: Cigarettes  . Smokeless tobacco: Never Used     Comment: 7 cigarettes per day  . Alcohol Use: No    OB History   Grav Para Term Preterm Abortions TAB SAB Ect Mult Living                  Review of Systems  Constitutional:       Per HPI, otherwise negative  HENT:       Per HPI, otherwise negative  Respiratory:       Per HPI, otherwise negative  Cardiovascular:       Per HPI,  otherwise negative  Gastrointestinal: Negative for vomiting.  Endocrine:       Negative aside from HPI  Genitourinary:       Neg aside from HPI   Musculoskeletal:       Per HPI, otherwise negative  Skin: Negative.   Neurological: Negative for syncope.    Allergies  Codeine; Sulfonamide derivatives; and Latex  Home Medications   Current Outpatient Rx  Name  Route  Sig  Dispense  Refill  . amitriptyline (ELAVIL) 25 MG tablet   Oral   Take 25 mg by mouth at bedtime.          Marland Kitchen amLODipine (NORVASC) 10 MG tablet   Oral   Take 5 mg by mouth 2 (two) times daily.          . furosemide (LASIX) 20 MG tablet   Oral   Take 1 tablet (20 mg total) by mouth daily.   30 tablet   11   . hydrocortisone (ANUSOL-HC) 25 MG suppository   Rectal   Place 25 mg rectally as needed for hemorrhoids.         . hyoscyamine (LEVSIN SL) 0.125 MG SL tablet   Sublingual   Place  0.125 mg under the tongue every 4 (four) hours as needed for cramping (for stomach spasms).         . methadone (DOLOPHINE) 10 MG tablet   Oral   Take 10 mg by mouth every 8 (eight) hours.          . metoprolol (LOPRESSOR) 50 MG tablet   Oral   Take 1 tablet (50 mg total) by mouth 2 (two) times daily.   60 tablet   11   . omeprazole (PRILOSEC) 40 MG capsule   Oral   Take 40 mg by mouth daily.         Marland Kitchen oxyCODONE (ROXICODONE) 15 MG immediate release tablet   Oral   Take 15 mg by mouth every 4 (four) hours as needed.         . potassium chloride (KLOR-CON M10) 10 MEQ tablet   Oral   Take 1 tablet (10 mEq total) by mouth daily.   30 tablet   11   . temazepam (RESTORIL) 30 MG capsule   Oral   Take 30 mg by mouth at bedtime.           BP 160/82  Pulse 78  Temp(Src) 98.6 F (37 C) (Oral)  Resp 16  Wt 275 lb (124.739 kg)  BMI 43.06 kg/m2  SpO2 97%  Physical Exam  Nursing note and vitals reviewed. Constitutional: She is oriented to person, place, and time. She appears well-developed and well-nourished. No distress.  HENT:  Head: Normocephalic and atraumatic.  Eyes: Conjunctivae and EOM are normal.  Cardiovascular: Normal rate and regular rhythm.   Pulmonary/Chest: Effort normal and breath sounds normal. No stridor. No respiratory distress.  Abdominal: She exhibits no distension.  Musculoskeletal: She exhibits no edema.  Neurological: She is alert and oriented to person, place, and time. She displays no atrophy and no tremor. No cranial nerve deficit or sensory deficit. She exhibits normal muscle tone. She displays no seizure activity. Coordination normal.  Skin: Skin is warm and dry.  Psychiatric: She has a normal mood and affect.    ED Course  Procedures (including critical care time)  Labs Reviewed  BASIC METABOLIC PANEL - Abnormal; Notable for the following:    Potassium 3.0 (*)    Glucose, Bld 142 (*)  GFR calc non Af Amer 66 (*)    GFR calc Af  Amer 76 (*)    All other components within normal limits  CBC  POCT I-STAT TROPONIN I   Dg Chest Port 1 View  02/27/2013   *RADIOLOGY REPORT*  Clinical Data: Chest pain.  PORTABLE CHEST - 1 VIEW  Comparison: 08/30/2008  Findings: Lungs show no evidence of edema or infiltrate.  The heart size is at the upper limits of normal.  No pleural effusions are identified.  IMPRESSION: No active disease.   Original Report Authenticated By: Irish Lack, M.D.     No diagnosis found.  Cardiac 80 sinus rhythm normal Pulse ox 100% room air normal    Date: 02/27/2013  Rate: 78  Rhythm: normal sinus rhythm  QRS Axis: normal  Intervals: normal  ST/T Wave abnormalities: nonspecific ST/T changes  Conduction Disutrbances:none  Narrative Interpretation:   Old EKG Reviewed: no changes - sig ABNORMAL  11:14 PM Patient resting, no pain. We discussed results, the need for f/u, return precautions. MDM  This generally well-appearing female presents with a left-sided chest pain, generalized discomfort.  On exam she is awake and alert, in no distress.  She seemed stable, with mild hypertension. Patient's evaluation is largely reassuring, and she is a significant risk factors beyond hypertension and smoking.         Gerhard Munch, MD 02/27/13 2317

## 2013-03-11 ENCOUNTER — Ambulatory Visit (INDEPENDENT_AMBULATORY_CARE_PROVIDER_SITE_OTHER): Payer: 59 | Admitting: Family Medicine

## 2013-03-11 ENCOUNTER — Encounter: Payer: Self-pay | Admitting: Family Medicine

## 2013-03-11 VITALS — BP 158/100 | HR 74 | Temp 97.7°F | Wt 275.0 lb

## 2013-03-11 DIAGNOSIS — N390 Urinary tract infection, site not specified: Secondary | ICD-10-CM

## 2013-03-11 DIAGNOSIS — R1032 Left lower quadrant pain: Secondary | ICD-10-CM

## 2013-03-11 LAB — POCT URINALYSIS DIPSTICK
Bilirubin, UA: NEGATIVE
Glucose, UA: NEGATIVE
Ketones, UA: NEGATIVE
Leukocytes, UA: NEGATIVE
Nitrite, UA: NEGATIVE
Protein, UA: NEGATIVE
Spec Grav, UA: 1.01
Urobilinogen, UA: 0.2
pH, UA: 6

## 2013-03-11 MED ORDER — CIPROFLOXACIN HCL 500 MG PO TABS: 500.0000 mg | ORAL_TABLET | Freq: Two times a day (BID) | ORAL | Status: AC

## 2013-03-11 NOTE — Progress Notes (Signed)
  Subjective:    Patient ID: [name], female    DOB: 04/06/1961, 52 y.o.   MRN: [id number]  HPI Here for 2 weeks of left sided middle back and flank pain. No urinary burning but there is urgency and frequency. No fever. Some nausea without vomiting.    Review of Systems  Constitutional: Negative.   Gastrointestinal: Negative.   Genitourinary: Positive for urgency, frequency and flank pain. Negative for dysuria.       Objective:   Physical Exam  Constitutional: She appears well-developed and well-nourished.  Abdominal: Soft. Bowel sounds are normal. She exhibits no distension and no mass. There is no tenderness. There is no rebound and no guarding.          Assessment & Plan:  Drink plenty of water.

## 2013-03-13 LAB — URINE CULTURE: Colony Count: >=100000

## 2013-03-16 ENCOUNTER — Telehealth: Payer: Self-pay | Admitting: Family Medicine

## 2013-03-16 NOTE — Telephone Encounter (Signed)
Pt called to get urine results and I did speak with her.

## 2013-03-16 NOTE — Progress Notes (Signed)
Quick Note:  I tried to reach pt by phone, no answer or option to leave a message. ______

## 2013-03-31 ENCOUNTER — Encounter (INDEPENDENT_AMBULATORY_CARE_PROVIDER_SITE_OTHER): Payer: 59 | Admitting: Surgery

## 2013-03-31 ENCOUNTER — Ambulatory Visit (INDEPENDENT_AMBULATORY_CARE_PROVIDER_SITE_OTHER): Payer: 59 | Admitting: Surgery

## 2013-03-31 ENCOUNTER — Encounter (INDEPENDENT_AMBULATORY_CARE_PROVIDER_SITE_OTHER): Payer: Self-pay | Admitting: Surgery

## 2013-03-31 VITALS — BP 140/92 | HR 68 | Temp 97.6°F | Resp 15 | Ht 67.0 in | Wt 278.6 lb

## 2013-03-31 DIAGNOSIS — R29818 Other symptoms and signs involving the nervous system: Secondary | ICD-10-CM | POA: Insufficient documentation

## 2013-03-31 DIAGNOSIS — R222 Localized swelling, mass and lump, trunk: Secondary | ICD-10-CM | POA: Insufficient documentation

## 2013-03-31 NOTE — Addendum Note (Signed)
Addended by: Joanette Gula on: 03/31/2013 02:45 PM   Modules accepted: Orders

## 2013-03-31 NOTE — Progress Notes (Signed)
General Surgery [facility]  Chief Complaint  Patient presents with  . New Evaluation    eval lipoma/back mass - referral from Dr. Thyra Breed    HISTORY: Patient is a 52 year old female known to my practice from excision of soft tissue masses of left upper back and left upper extremity. Patient is followed for chronic pain by Dr. Thyra Breed. He identified an area in the left lower back with point tenderness on exam and a possible underlying mass. Patient presents today for evaluation. Patient has developed discomfort at this point over the past several months. No history of trauma.  Past Medical History  Diagnosis Date  . Osteoarthritis   . Headache(784.0)     migraine like per patient  . Hypertension   . Low back pain     dr phillips, pain management  . Degenerative joint disease   . Gallstones   . GERD (gastroesophageal reflux disease)   . DDD (degenerative disc disease), cervical   . Colon polyps   . Diverticulosis     Current Outpatient Prescriptions  Medication Sig Dispense Refill  . amitriptyline (ELAVIL) 25 MG tablet Take 25 mg by mouth at bedtime.       Marland Kitchen amLODipine (NORVASC) 10 MG tablet Take 10 mg by mouth 2 (two) times daily.       . furosemide (LASIX) 20 MG tablet Take 1 tablet (20 mg total) by mouth daily.  30 tablet  11  . hyoscyamine (LEVSIN SL) 0.125 MG SL tablet Place 0.125 mg under the tongue every 4 (four) hours as needed for cramping (for stomach spasms).      . methadone (DOLOPHINE) 10 MG tablet Take 10 mg by mouth every 8 (eight) hours.       . metoprolol (LOPRESSOR) 50 MG tablet Take 1 tablet (50 mg total) by mouth 2 (two) times daily.  60 tablet  11  . omeprazole (PRILOSEC) 40 MG capsule Take 40 mg by mouth daily.      Marland Kitchen oxyCODONE (ROXICODONE) 15 MG immediate release tablet Take 15 mg by mouth every 4 (four) hours as needed.      . potassium chloride (KLOR-CON M10) 10 MEQ tablet Take 1 tablet (10 mEq total) by mouth daily.  30  tablet  11  . temazepam (RESTORIL) 30 MG capsule Take 30 mg by mouth at bedtime.       No current facility-administered medications for this visit.    Allergies  Allergen Reactions  . Codeine Itching and Nausea And Vomiting    Itching all over the body  . Sulfonamide Derivatives Hives    All over the body  . Latex Hives    Family History  Problem Relation Age of Onset  . Crohn's disease Other     nephew  . Stomach cancer Mother   . Heart disease Father   . Kidney cancer Sister     one out of the four  . Hypertension Sister   . Hypertension Brother   . Colon cancer Maternal Grandmother     History   Social History  . Marital Status: Married    Spouse Name: N/A    Number of Children: 3  . Years of Education: N/A   Occupational History  . retired/disabled    Social History Main Topics  . Smoking status: Current Every Day Smoker -- 0.25 packs/day for 20 years    Types: Cigarettes  . Smokeless tobacco: Never Used     Comment: 7 cigarettes  per day  . Alcohol Use: No  . Drug Use: No  . Sexually Active: Yes    Birth Control/ Protection: Post-menopausal   Other Topics Concern  . None   Social History Narrative  . None    REVIEW OF SYSTEMS - PERTINENT POSITIVES ONLY: Painful region left mid back, possible mass.  EXAM: Filed Vitals:   03/31/13 1415  BP: 140/92  Pulse: 68  Temp: 97.6 F (36.4 C)  Resp: 15    HEENT: normocephalic; pupils equal and reactive; sclerae clear; dentition good; mucous membranes moist NECK:  symmetric on extension; no palpable anterior or posterior cervical lymphadenopathy; no supraclavicular masses; no tenderness CHEST: clear to auscultation bilaterally without rales, rhonchi, or wheezes CARDIAC: regular rate and rhythm without significant murmur; peripheral pulses are full BACK:  Well-healed surgical wound over left scapula with mild keloid formation; tender region left paraspinous muscles low thoracic or upper lumbar region;  firmness to palpation but no discrete mass noted EXT:  non-tender without edema; no deformity NEURO: no gross focal deficits; no sign of tremor   LABORATORY RESULTS: See [facility] (CHL-Epic) for most recent results  RADIOLOGY RESULTS: See [facility] (CHL-Epic) for most recent results  IMPRESSION: Left paraspinous tenderness, rule out underlying mass  PLAN: Patient and I discussed the physical findings today. There is not a clear cut lipoma or other soft tissue neoplasm identifiable on physical exam. There is asymmetry comparing the left side to the right. There is tenderness on palpation. I have recommended that the patient undergo an MRI scan to see if there is truly a neoplasm in this region. Patient is in agreement. We will attempt to schedule this study in the near future. I will contact the patient with the results and we will make further plans once the results are reviewed.  Velora Heckler, MD, FACS General & Endocrine Surgery [facility]  Primary Care Physician: Nelwyn Salisbury, MD

## 2013-03-31 NOTE — Patient Instructions (Signed)

## 2013-04-01 ENCOUNTER — Telehealth (INDEPENDENT_AMBULATORY_CARE_PROVIDER_SITE_OTHER): Payer: Self-pay | Admitting: General Surgery

## 2013-04-01 NOTE — Telephone Encounter (Signed)
Left message for patient to call office  To inform her that her MRI Lumbar spine was cx due to [facility] requiring a peer to peer

## 2013-04-04 ENCOUNTER — Ambulatory Visit (HOSPITAL_COMMUNITY): Payer: 59

## 2013-04-04 ENCOUNTER — Telehealth (INDEPENDENT_AMBULATORY_CARE_PROVIDER_SITE_OTHER): Payer: Self-pay

## 2013-04-04 NOTE — Telephone Encounter (Signed)
Peer to Peer given to Dr Gerrit Friends to call [facility] for MRI.

## 2013-04-11 ENCOUNTER — Telehealth (INDEPENDENT_AMBULATORY_CARE_PROVIDER_SITE_OTHER): Payer: Self-pay

## 2013-04-11 NOTE — Telephone Encounter (Signed)
Dr Gerrit Friends is aware and will per Dr Gerrit Friends call when time allows.

## 2013-04-11 NOTE — Telephone Encounter (Signed)
The patient called to follow up on the MRI.  I see the note that said Dr Gerrit Friends had to do a peer to peer.  Please follow up with the patient.

## 2013-04-12 NOTE — Telephone Encounter (Signed)
Patient has called again to ask if MRI has been approved.  Explained below note to patient that Dr. Gerrit Friends is aware it needs to be done but it has not been done at this time.  Patient states understanding but is very frustrated stating the pain is getting worse.  Patient states she may have to end up going to the ED if she doesn't get any help soon.

## 2013-04-13 ENCOUNTER — Telehealth (INDEPENDENT_AMBULATORY_CARE_PROVIDER_SITE_OTHER): Payer: Self-pay | Admitting: Surgery

## 2013-04-13 NOTE — Telephone Encounter (Signed)
Attempted to call patient to update her that peer to peer was completed this morning and Dr. Gerrit Friends was able to obtain approval of MRI.  Patient's voicemail states it is full so unable to leave a message for patient.

## 2013-04-13 NOTE — Telephone Encounter (Signed)
I contacted the patient's insurance company to complete a peer to peer review for approval of her MRI scan of the back. I explained that we are looking for a soft tissue abnormality in the left paraspinous region. MRI scan was approved. We will convey this information to her radiologist.  Velora Heckler, MD, [facility] Office: [phone]

## 2013-04-15 ENCOUNTER — Ambulatory Visit (HOSPITAL_COMMUNITY)
Admission: RE | Admit: 2013-04-15 | Discharge: 2013-04-15 | Disposition: A | Discharge status: Home / Self Care | Payer: 59 | Source: Ambulatory Visit | Attending: Surgery | Admitting: Surgery

## 2013-04-15 DIAGNOSIS — R229 Localized swelling, mass and lump, unspecified: Secondary | ICD-10-CM | POA: Insufficient documentation

## 2013-04-15 DIAGNOSIS — M5126 Other intervertebral disc displacement, lumbar region: Secondary | ICD-10-CM | POA: Insufficient documentation

## 2013-04-15 DIAGNOSIS — M5137 Other intervertebral disc degeneration, lumbosacral region: Secondary | ICD-10-CM | POA: Insufficient documentation

## 2013-04-15 DIAGNOSIS — M51379 Other intervertebral disc degeneration, lumbosacral region without mention of lumbar back pain or lower extremity pain: Secondary | ICD-10-CM | POA: Insufficient documentation

## 2013-04-15 DIAGNOSIS — R222 Localized swelling, mass and lump, trunk: Secondary | ICD-10-CM

## 2013-04-20 ENCOUNTER — Telehealth (INDEPENDENT_AMBULATORY_CARE_PROVIDER_SITE_OTHER): Payer: Self-pay | Admitting: Surgery

## 2013-04-20 NOTE — Telephone Encounter (Signed)
Telephone call to patient to review the results of the MRI scan. Study failed to show any soft tissue neoplasm or abnormality in the dorsal soft tissues of the mid and lower back. There are some changes in the lumbosacral spine including degenerative disc disease. I have asked the patient to review the study with Dr. Thyra Breed to see if there are any further treatment modalities available to her. There is no indication for surgery on the soft tissues or paraspinous muscles based on this study.  Patient will return to our practice for surgical care as needed.  Diane Heckler, MD, [facility] Office: [phone]

## 2013-04-28 ENCOUNTER — Encounter: Payer: Self-pay | Admitting: Family Medicine

## 2013-04-28 ENCOUNTER — Ambulatory Visit (INDEPENDENT_AMBULATORY_CARE_PROVIDER_SITE_OTHER): Payer: 59 | Admitting: Family Medicine

## 2013-04-28 VITALS — BP 138/90 | Temp 98.3°F | Wt 271.0 lb

## 2013-04-28 DIAGNOSIS — M549 Dorsalgia, unspecified: Secondary | ICD-10-CM

## 2013-04-28 DIAGNOSIS — G8929 Other chronic pain: Secondary | ICD-10-CM

## 2013-04-28 NOTE — Progress Notes (Signed)
Chief Complaint  Patient presents with  . left side pain    HPI:  52 yo F pt of Dr. Clent Ridges here for acute visit for L sided back pain: -symptoms: L side low back pain and back pain chronically - for a long time; feels like a pull on this side worse with certain movements and twisting, chronic back pain for many years   reports had MRI and told has disc issues and reports told by surgeon to see her neurologist and she wanted referral for this -denies: fevers, chills, vomiting, abd pain,burning with urination, dysuria, malaise, weakness or numbness in LE, bowel or bladder incontinence  ROS: See pertinent positives and negatives per HPI.  Past Medical History  Diagnosis Date  . Osteoarthritis   . Headache(784.0)     migraine like per patient  . Hypertension   . Low back pain     dr phillips, pain management  . Degenerative joint disease   . Gallstones   . GERD (gastroesophageal reflux disease)   . DDD (degenerative disc disease), cervical   . Colon polyps   . Diverticulosis     Family History  Problem Relation Age of Onset  . Crohn's disease Other     nephew  . Stomach cancer Mother   . Heart disease Father   . Kidney cancer Sister     one out of the four  . Hypertension Sister   . Hypertension Brother   . Colon cancer Maternal Grandmother     History   Social History  . Marital Status: Married    Spouse Name: N/A    Number of Children: 3  . Years of Education: N/A   Occupational History  . retired/disabled    Social History Main Topics  . Smoking status: Current Every Day Smoker -- 0.25 packs/day for 20 years    Types: Cigarettes  . Smokeless tobacco: Never Used     Comment: 7 cigarettes per day  . Alcohol Use: No  . Drug Use: No  . Sexually Active: Yes    Birth Control/ Protection: Post-menopausal   Other Topics Concern  . None   Social History Narrative  . None    Current outpatient prescriptions:amitriptyline (ELAVIL) 25 MG tablet, Take 25 mg by  mouth at bedtime. , Disp: , Rfl: ;  amLODipine (NORVASC) 10 MG tablet, Take 10 mg by mouth 2 (two) times daily. , Disp: , Rfl: ;  furosemide (LASIX) 20 MG tablet, Take 1 tablet (20 mg total) by mouth daily., Disp: 30 tablet, Rfl: 11 hyoscyamine (LEVSIN SL) 0.125 MG SL tablet, Place 0.125 mg under the tongue every 4 (four) hours as needed for cramping (for stomach spasms)., Disp: , Rfl: ;  methadone (DOLOPHINE) 10 MG tablet, Take 10 mg by mouth every 8 (eight) hours. , Disp: , Rfl: ;  metoprolol (LOPRESSOR) 50 MG tablet, Take 1 tablet (50 mg total) by mouth 2 (two) times daily., Disp: 60 tablet, Rfl: 11 omeprazole (PRILOSEC) 40 MG capsule, Take 40 mg by mouth daily., Disp: , Rfl: ;  oxyCODONE (ROXICODONE) 15 MG immediate release tablet, Take 15 mg by mouth every 4 (four) hours as needed., Disp: , Rfl: ;  potassium chloride (KLOR-CON M10) 10 MEQ tablet, Take 1 tablet (10 mEq total) by mouth daily., Disp: 30 tablet, Rfl: 11;  temazepam (RESTORIL) 30 MG capsule, Take 30 mg by mouth at bedtime., Disp: , Rfl:   EXAM:  Filed Vitals:   04/28/13 1545  BP: 138/90  Temp: 98.3  F (36.8 C)    Body mass index is 42.43 kg/(m^2).  GENERAL: vitals reviewed and listed above, alert, oriented, appears well hydrated and in no acute distress  HEENT: atraumatic, conjunttiva clear, no obvious abnormalities on inspection of external nose and ears  NECK: no obvious masses on inspection  LUNGS: clear to auscultation bilaterally, no wheezes, rales or rhonchi, good air movement  CV: HRRR, no peripheral edema  ABD: no CVA TTP  MS: moves all extremities without noticeable abnormality Gait normal TTP R paraspinal lumbar muscle, R QL muscle and R PSIS areas  No CVA TTP Normal Gait Normal inspection of back, no obvious scoliosis or leg length descrepancy No bony TTP -/+ tests: neg trendelenburg,-facet loading, -SLRT, -CLRT, -FABER, -FADIR though complains of pain in back with most movements Normal muscle strength,  sensation to light touch and DTRs in LEs bilaterally  PSYCH: pleasant and cooperative, no obvious depression or anxiety  ASSESSMENT AND PLAN:  Discussed the following assessment and plan:  Chronic back pain  -suspect muscle strain versus DDD - she actually is followed by a neurologist/pain clinic - which per reading surgeon's notes was whom he wanted her to see - reviewed this with pt and she was in agreement -no findings to suggest urgent neurosurgical issue -advised heat, offered PT (she will hold off on this until sees her pain clinic) follow up as needed -Patient advised to return or notify a doctor immediately if symptoms worsen or persist or new concerns arise.  Patient Instructions  -follow up with your pain clinic  -in meantime heat for 15 minutes twice daily and gentle activities     Sheray Grist R.

## 2013-04-28 NOTE — Patient Instructions (Addendum)
-  follow up with your pain clinic  -in meantime heat for 15 minutes twice daily and gentle activities

## 2013-06-15 ENCOUNTER — Encounter: Payer: Self-pay | Admitting: Family Medicine

## 2013-06-15 ENCOUNTER — Ambulatory Visit (INDEPENDENT_AMBULATORY_CARE_PROVIDER_SITE_OTHER): Payer: 59 | Admitting: Family Medicine

## 2013-06-15 VITALS — BP 170/100 | HR 63 | Temp 98.2°F | Wt 272.0 lb

## 2013-06-15 DIAGNOSIS — N390 Urinary tract infection, site not specified: Secondary | ICD-10-CM

## 2013-06-15 LAB — POCT URINALYSIS DIPSTICK
Bilirubin, UA: NEGATIVE
Glucose, UA: NEGATIVE
Ketones, UA: NEGATIVE
Leukocytes, UA: NEGATIVE
Nitrite, UA: NEGATIVE
Spec Grav, UA: 1.015
Urobilinogen, UA: 0.2
pH, UA: 6.5

## 2013-06-15 MED ORDER — CIPROFLOXACIN HCL 500 MG PO TABS: 500.0000 mg | ORAL_TABLET | Freq: Two times a day (BID) | ORAL | Status: DC

## 2013-06-15 NOTE — Addendum Note (Signed)
Addended by: Aniceto Boss A on: 06/15/2013 12:34 PM   Modules accepted: Orders

## 2013-06-15 NOTE — Progress Notes (Signed)
  Subjective:    Patient ID: [name], female    DOB: 1961/06/01, 52 y.o.   MRN: [id number]  HPI Here for 2 days of middle back pains, lower abdominal pains, and urinary burning. No fever. Drinking plenty of water.    Review of Systems  Constitutional: Negative.   Gastrointestinal: Negative.   Genitourinary: Positive for dysuria, urgency, frequency, flank pain and pelvic pain.       Objective:   Physical Exam  Constitutional: She appears well-developed and well-nourished.  Abdominal: Soft. Bowel sounds are normal. She exhibits no distension and no mass. There is no tenderness. There is no rebound and no guarding.          Assessment & Plan:  Culture the sample

## 2013-06-17 LAB — URINE CULTURE
Colony Count: NO GROWTH
Organism ID, Bacteria: NO GROWTH

## 2013-06-21 NOTE — Progress Notes (Signed)
Quick Note:  I spoke with pt ______ 

## 2013-06-24 ENCOUNTER — Other Ambulatory Visit: Payer: Self-pay | Admitting: Family Medicine

## 2013-07-08 ENCOUNTER — Telehealth: Payer: Self-pay | Admitting: Family Medicine

## 2013-07-08 NOTE — Telephone Encounter (Signed)
Patient Information:  Caller Name: Merrin  Phone: [phone]  Patient: [name]  Gender: Female  DOB: Dec 14, 1960  Age: 52 Years  PCP: Gershon Crane [facility])  Pregnant: No  Office Follow Up:  Does the office need to follow up with this patient?: No  Instructions For The Office: N/A  RN Note:  All [facility] offices are full.  No appointments available.  Called to the office and spoke with Sue Lush who advised pt will need to be seen at an UC in the area or go to ER for treatment.  Patient was advised and agreeable.  Advised pt not to take any OTC medications until she has seen a physican and he recommends this to her.    Symptoms  Reason For Call & Symptoms: She started having diarrhea on 07/06/2013.  She has no fever.  The diarrhea is yellow water.  She also has sharp pains in the bottom of her stomach after she eats or drinks.  She has had her Gallbladder removed.  Reviewed Health History In EMR: Yes  Reviewed Medications In EMR: Yes  Reviewed Allergies In EMR: Yes  Reviewed Surgeries / Procedures: Yes  Date of Onset of Symptoms: 07/06/2013 OB / GYN:  LMP: Unknown  Guideline(s) Used:  Diarrhea  Diarrhea  Disposition Per Guideline:   Go to Office Now  Reason For Disposition Reached:   Constant abdominal pain lasting > 2 hours  Advice Given:  N/A  Patient Will Follow Care Advice:  YES

## 2013-07-12 ENCOUNTER — Telehealth: Payer: Self-pay | Admitting: Family Medicine

## 2013-07-12 ENCOUNTER — Ambulatory Visit: Payer: 59

## 2013-07-12 ENCOUNTER — Encounter: Payer: Self-pay | Admitting: Family Medicine

## 2013-07-12 ENCOUNTER — Ambulatory Visit (INDEPENDENT_AMBULATORY_CARE_PROVIDER_SITE_OTHER): Payer: 59 | Admitting: Family Medicine

## 2013-07-12 VITALS — BP 158/96 | Temp 98.4°F | Wt 264.0 lb

## 2013-07-12 DIAGNOSIS — A084 Viral intestinal infection, unspecified: Secondary | ICD-10-CM

## 2013-07-12 DIAGNOSIS — A088 Other specified intestinal infections: Secondary | ICD-10-CM

## 2013-07-12 DIAGNOSIS — D179 Benign lipomatous neoplasm, unspecified: Secondary | ICD-10-CM

## 2013-07-12 NOTE — Telephone Encounter (Signed)
Patient Information:  Caller Name: Pearle  Phone: [phone]  Patient: [name]  Gender: Female  DOB: 1960-12-16  Age: 52 Years  PCP: Gershon Crane [facility])  Pregnant: No  Office Follow Up:  Does the office need to follow up with this patient?: Yes  Instructions For The Office: Seen in office today- needing further instuctions and possible stool cultures/bloodwork.  RN Note:  Please call back to let her know if stool sample needed and sister can come by office to pick up kit.  Symptoms  Reason For Call & Symptoms: Calling about Diarrhea 6-7 x daily for past week and sharp pains in lower abdomen before passing yellow, liquid sometimes mucosy stool.  No nausea. Afebrile. Hx Gall Bladder removed in 2012 and Diverticulitis. She normally eats bland fat free diet. Everytime she eats having diarrhea immediately after. She is starting to feel a little week. Drinking Pedialyte, has headache today behind eyes > L. BP = 155/98 when seen in the office this morning. She thought she was getting better but had several bouts of diarreha so far today sicne appointment. She is not sure if she should take Lomotil and how much Pedialyte to drink with BP issues. Needing advice.  Reviewed Health History In EMR: Yes  Reviewed Medications In EMR: Yes  Reviewed Allergies In EMR: Yes  Reviewed Surgeries / Procedures: Yes  Date of Onset of Symptoms: 07/03/2013  Treatments Tried: Hycosamine, Pedialyte, Immodium  Treatments Tried Worked: No OB / GYN:  LMP: Unknown  Guideline(s) Used:  Diarrhea  Disposition Per Guideline:   See Today in Office  Reason For Disposition Reached:   Mucus or pus in stool has been present > 2 days and diarrhea is more than mild  Advice Given:  Expected Course:  Viral diarrhea lasts 4-7 days. Always worse on days 1 and 2.  Reassurance:  In healthy adults, new-onset diarrhea is usually caused by a viral infection of the intestines, which you can treat at home.  Diarrhea is the body's way of getting rid of the infection. Here are some tips on how to keep ahead of the fluid losses.  Fluids:  Drink more fluids, at least 8-10 glasses (8 oz or 240 ml) daily.  Supplement this with saltine crackers or soups to make certain that you are getting sufficient fluid and salt to meet your body's needs.  Nutrition:  Maintaining some food intake during episodes of diarrhea is important.  Ideal initial foods include boiled starches/cereals (e.g., potatoes, rice, noodles, wheat, oats) with a small amount of salt to taste.  Other acceptable foods include: bananas, yogurt, crackers, soup.  As your stools return to normal consistency, resume a normal diet.  Expected Course:  Viral diarrhea lasts 4-7 days. Always worse on days 1 and 2.  Call Back If:  Signs of dehydration occur (e.g., no urine for more than 12 hours, very dry mouth, lightheaded, etc.)  Diarrhea lasts over 7 days  You become worse.  Patient Refused Recommendation:  Patient Requests Prescription  Please ask Dr. Clent Ridges if she should try Immodium- she thinks stool is mucosy. Sister can pick up stool sample kit. Also needing to know how much Pedialyte she should be drinking. Worried about getting too much K+.

## 2013-07-12 NOTE — Progress Notes (Signed)
  Subjective:    Patient ID: [name], female    DOB: 1960/12/06, 52 y.o.   MRN: [id number]  HPI Here for several issues. First she was seen here on 06-15-13 for lower abdominal pains and dysuria. She took a course of Cipro and this went away. Her culture that day grew no bacteria. Then one week ago she developed nausea, vomiting, and diarrhea. No fever. She saw Urgent Care last weekend and was diagnosed with a GI virus. She has been drinking fluids and now she feels like it has resolved. Her last loose stool was yesterday. She can now keep food and liquids down. Lastly she asks me to check a tender lump on the lower back which has been bothering her for 6 months. She saw Dr. Gerrit Friends and he did not know what it was. She had a lumbar spine MRI which showed diffuse disc disease but no soft tissue masses.    Review of Systems  Constitutional: Negative.   Gastrointestinal: Positive for nausea, vomiting and diarrhea. Negative for abdominal pain, constipation, blood in stool and abdominal distention.  Musculoskeletal: Positive for back pain.       Objective:   Physical Exam  Constitutional: She appears well-developed and well-nourished. No distress.  Abdominal: Soft. Bowel sounds are normal. She exhibits no distension and no mass. There is no tenderness. There is no rebound and no guarding.  Musculoskeletal:  There is a small firm mobile tender lump beneath the skin on the left lower back           Assessment & Plan:  Her UTI has resolved. Her GI virus has resolved. The lump on her back is an inflamed lipoma. She could have Dr. Gerrit Friends remove it if she likes.

## 2013-07-12 NOTE — Telephone Encounter (Signed)
She clearly told me today that she was improving and that the diarrhea had stopped. I do not think we need to get stool studies. She can certainly use Imodium AD if she wants to

## 2013-07-13 ENCOUNTER — Other Ambulatory Visit: Payer: Self-pay | Admitting: Family Medicine

## 2013-07-13 NOTE — Telephone Encounter (Signed)
Refill for 6 months. 

## 2013-07-13 NOTE — Telephone Encounter (Signed)
I spoke with pt and she is doing much better.

## 2013-07-18 ENCOUNTER — Ambulatory Visit: Payer: 59

## 2013-09-23 ENCOUNTER — Ambulatory Visit (HOSPITAL_COMMUNITY)
Admission: RE | Admit: 2013-09-23 | Discharge: 2013-09-23 | Disposition: A | Discharge status: Home / Self Care | Payer: 59 | Source: Ambulatory Visit | Attending: Anesthesiology | Admitting: Anesthesiology

## 2013-09-23 ENCOUNTER — Other Ambulatory Visit (HOSPITAL_COMMUNITY): Payer: Self-pay | Admitting: Anesthesiology

## 2013-09-23 DIAGNOSIS — M25552 Pain in left hip: Secondary | ICD-10-CM

## 2013-09-23 DIAGNOSIS — M25559 Pain in unspecified hip: Secondary | ICD-10-CM | POA: Insufficient documentation

## 2013-10-19 ENCOUNTER — Other Ambulatory Visit: Payer: Self-pay | Admitting: Family Medicine

## 2013-10-28 ENCOUNTER — Telehealth: Payer: Self-pay | Admitting: Family Medicine

## 2013-10-28 ENCOUNTER — Encounter: Payer: Self-pay | Admitting: Internal Medicine

## 2013-10-28 ENCOUNTER — Ambulatory Visit (INDEPENDENT_AMBULATORY_CARE_PROVIDER_SITE_OTHER): Payer: 59 | Admitting: Internal Medicine

## 2013-10-28 VITALS — BP 126/80 | HR 81 | Temp 98.3°F | Resp 20 | Ht 67.0 in | Wt 274.0 lb

## 2013-10-28 DIAGNOSIS — I10 Essential (primary) hypertension: Secondary | ICD-10-CM

## 2013-10-28 DIAGNOSIS — M549 Dorsalgia, unspecified: Secondary | ICD-10-CM

## 2013-10-28 DIAGNOSIS — R300 Dysuria: Secondary | ICD-10-CM

## 2013-10-28 LAB — POCT URINALYSIS DIPSTICK
Bilirubin, UA: NEGATIVE
Glucose, UA: NEGATIVE
Ketones, UA: NEGATIVE
Nitrite, UA: NEGATIVE
Spec Grav, UA: 1.01
Urobilinogen, UA: 0.2
pH, UA: 6

## 2013-10-28 NOTE — Progress Notes (Signed)
Pre-visit discussion using our clinic review tool. No additional management support is needed unless otherwise documented below in the visit note.  

## 2013-10-28 NOTE — Progress Notes (Signed)
Subjective:    Patient ID: [name], female    DOB: 1960-12-03, 53 y.o.   MRN: [id number]  HPI  53 year old patient who has treated hypertension who presents with a 3 to four-day history of pain in the left flank area she also describes some discomfort in the groin region as well as some urinary urgency. Urinalysis was reviewed and did reveal some hematuria but otherwise no abnormal findings. She has had a recent left hip x-ray performed it was unremarkable. No comment on renal stone disease noted. Other x-rays are reviewed this has included a CT abdominal scan approximately 3 years ago that revealed no renal stones. She has had a prior hysterectomy. She is followed by chronic pain management  Past Medical History  Diagnosis Date  . Osteoarthritis   . Headache(784.0)     migraine like per patient  . Hypertension   . Low back pain     dr phillips, pain management  . Degenerative joint disease   . Gallstones   . GERD (gastroesophageal reflux disease)   . DDD (degenerative disc disease), cervical   . Colon polyps   . Diverticulosis     History   Social History  . Marital Status: Married    Spouse Name: N/A    Number of Children: 3  . Years of Education: N/A   Occupational History  . retired/disabled    Social History Main Topics  . Smoking status: Current Every Day Smoker -- 0.25 packs/day for 20 years    Types: Cigarettes  . Smokeless tobacco: Never Used     Comment: 7 cigarettes per day  . Alcohol Use: No  . Drug Use: No  . Sexual Activity: Yes    Birth Control/ Protection: Post-menopausal   Other Topics Concern  . Not on file   Social History Narrative  . No narrative on file    Past Surgical History  Procedure Laterality Date  . Replacement total knee Right     right - dr Novella Olive  . Ganglion cyst excision Right     right wrist  . Lipoma excision Right 02/21/08    right hip, [facility]  . Replacement total knee Left 06/25/09    left - dr Marciano Sequin [facility]    . Tonsillectomy    . Joint replacement      both knees have been done, four times each per patient. Her body rejects the implant, no infection.  . Abdominal hysterectomy  06/22/06    lap, dr Mancel Bale  . Other surgical history      lipoma removed from back 2012   . Cholecystectomy  10/28/2011    Procedure: LAPAROSCOPIC CHOLECYSTECTOMY WITH INTRAOPERATIVE CHOLANGIOGRAM;  Surgeon: Earnstine Regal, MD;  Location: [facility];  Service: General;  Laterality: N/A;  . Mass excision  08/24/2012    Procedure: EXCISION MASS;  Surgeon: Earnstine Regal, MD;  Location: [facility];  Service: General;  Laterality: Left;  excisie soft tissue masses left shoulder(back) & left upper arm  . Colonoscopy  08-20-10    per Dr. Henrene Pastor, polyps, repeat in 3 yrs     Family History  Problem Relation Age of Onset  . Crohn's disease Other     nephew  . Stomach cancer Mother   . Heart disease Father   . Kidney cancer Sister     one out of the four  . Hypertension Sister   . Hypertension Brother   . Colon cancer Maternal Grandmother  Allergies  Allergen Reactions  . Codeine Itching and Nausea And Vomiting    Itching all over the body  . Sulfonamide Derivatives Hives    All over the body  . Latex Hives    Current Outpatient Prescriptions on File Prior to Visit  Medication Sig Dispense Refill  . amitriptyline (ELAVIL) 25 MG tablet Take 25 mg by mouth at bedtime.       Marland Kitchen amLODipine (NORVASC) 10 MG tablet Take 10 mg by mouth 2 (two) times daily.       . furosemide (LASIX) 20 MG tablet Take 1 tablet (20 mg total) by mouth daily.  30 tablet  11  . hyoscyamine (LEVSIN SL) 0.125 MG SL tablet Place 0.125 mg under the tongue every 4 (four) hours as needed for cramping (for stomach spasms).      . methadone (DOLOPHINE) 10 MG tablet Take 10 mg by mouth every 8 (eight) hours.       . metoprolol (LOPRESSOR) 50 MG tablet Take 1 tablet (50 mg total) by mouth 2 (two) times daily.  60 tablet  11  . omeprazole  (PRILOSEC) 40 MG capsule Take 40 mg by mouth daily.      Marland Kitchen omeprazole (PRILOSEC) 40 MG capsule TAKE 1 CAPSULE BY MOUTH EVERY DAY  30 capsule  3  . oxyCODONE (ROXICODONE) 15 MG immediate release tablet Take 15 mg by mouth every 4 (four) hours as needed.      . potassium chloride (KLOR-CON M10) 10 MEQ tablet Take 1 tablet (10 mEq total) by mouth daily.  30 tablet  11  . temazepam (RESTORIL) 30 MG capsule TAKE 1 CAPSULE BY MOUTH EVERY NIGHT AT BEDTIME AS NEEDED FOR SLEEP  30 capsule  5   No current facility-administered medications on file prior to visit.    BP 126/80  Pulse 81  Temp(Src) 98.3 F (36.8 C) (Oral)  Resp 20  Ht 5\' 7"  (1.702 m)  Wt 274 lb (124.286 kg)  BMI 42.90 kg/m2  SpO2 96%       Review of Systems  Constitutional: Negative.   HENT: Negative for congestion, dental problem, hearing loss, rhinorrhea, sinus pressure, sore throat and tinnitus.   Eyes: Negative for pain, discharge and visual disturbance.  Respiratory: Negative for cough and shortness of breath.   Cardiovascular: Negative for chest pain, palpitations and leg swelling.  Gastrointestinal: Negative for nausea, vomiting, abdominal pain, diarrhea, constipation, blood in stool and abdominal distention.  Genitourinary: Positive for urgency and flank pain. Negative for dysuria, frequency, hematuria, vaginal bleeding, vaginal discharge, difficulty urinating, vaginal pain and pelvic pain.  Musculoskeletal: Negative for arthralgias, gait problem and joint swelling.  Skin: Negative for rash.  Neurological: Negative for dizziness, syncope, speech difficulty, weakness, numbness and headaches.  Hematological: Negative for adenopathy.  Psychiatric/Behavioral: Negative for behavioral problems, dysphoric mood and agitation. The patient is not nervous/anxious.        Objective:   Physical Exam  Constitutional: She is oriented to person, place, and time. She appears well-developed and well-nourished.  HENT:  Head:  Normocephalic.  Right Ear: External ear normal.  Left Ear: External ear normal.  Mouth/Throat: Oropharynx is clear and moist.  Eyes: Conjunctivae and EOM are normal. Pupils are equal, round, and reactive to light.  Neck: Normal range of motion. Neck supple. No thyromegaly present.  Cardiovascular: Normal rate, regular rhythm, normal heart sounds and intact distal pulses.   Pulmonary/Chest: Effort normal and breath sounds normal.  Abdominal: Soft. Bowel sounds are normal. She exhibits  no distension and no mass. There is no tenderness. There is no rebound and no guarding.  Musculoskeletal: Normal range of motion.   mild left CVA tenderness  Lymphadenopathy:    She has no cervical adenopathy.  Neurological: She is alert and oriented to person, place, and time.  Skin: Skin is warm and dry. No rash noted.  Psychiatric: She has a normal mood and affect. Her behavior is normal.          Assessment & Plan:   Left flank pain with moderate to hematuria. Probable symptomatic renal stone disease. She seems fairly comfortable at present and does have analgesics available. We'll force fluids and observe over the weekend. If pain fails to improve or becomes worse we'll consider a CT urogram

## 2013-10-28 NOTE — Telephone Encounter (Signed)
Patient Information:  Caller Name: Nekeya  Phone: [phone]  Patient: [name]  Gender: Female  DOB: 1961-02-08  Age: 53 Years  PCP: Alysia Penna [facility])  Pregnant: No  Office Follow Up:  Does the office need to follow up with this patient?: No  Instructions For The Office: N/A  RN Note:  Onset of back pain along with urinary symptoms 10/25/13.  Afebrile.  c/o burning, frequency and urgency.  Denies blood in urine.  Per urinary symptoms protocol, advised appt within 4 hours due to presence of back pain; appt scheduled 10/28/13 1300 with Dr. Burnice Logan.  krs/can  Symptoms  Reason For Call & Symptoms: back pain, urinary symptoms  Reviewed Health History In EMR: Yes  Reviewed Medications In EMR: Yes  Reviewed Allergies In EMR: Yes  Reviewed Surgeries / Procedures: Yes  Date of Onset of Symptoms: 10/25/2013 OB / GYN:  LMP: Unknown  Guideline(s) Used:  Urination Pain - Female  Disposition Per Guideline:   Go to Office Now  Reason For Disposition Reached:   Side (flank) or lower back pain present  Advice Given:  N/A  Patient Will Follow Care Advice:  YES  Appointment Scheduled:  10/28/2013 13:00:00 Appointment Scheduled Provider:  Bluford Kaufmann (Family Practice > 56yrs old)

## 2013-10-28 NOTE — Patient Instructions (Signed)
Call or return to clinic prn if these symptoms worsen or fail to improve as anticipated.  Drink as much fluid as you  can tolerate over the next few days  Ureteral Colic (Kidney Stones) Ureteral colic is the result of a condition when kidney stones form inside the kidney. Once kidney stones are formed they may move into the tube that connects the kidney with the bladder (ureter). If this occurs, this condition may cause pain (colic) in the ureter.  CAUSES  Pain is caused by stone movement in the ureter and the obstruction caused by the stone. SYMPTOMS  The pain comes and goes as the ureter contracts around the stone. The pain is usually intense, sharp, and stabbing in character. The location of the pain may move as the stone moves through the ureter. When the stone is near the kidney the pain is usually located in the back and radiates to the belly (abdomen). When the stone is ready to pass into the bladder the pain is often located in the lower abdomen on the side the stone is located. At this location, the symptoms may mimic those of a urinary tract infection with urinary frequency. Once the stone is located here it often passes into the bladder and the pain disappears completely. TREATMENT   Your caregiver will provide you with medicine for pain relief.  You may require specialized follow-up X-rays.  The absence of pain does not always mean that the stone has passed. It may have just stopped moving. If the urine remains completely obstructed, it can cause loss of kidney function or even complete destruction of the involved kidney. It is your responsibility and in your interest that X-rays and follow-ups as suggested by your caregiver are completed. Relief of pain without passage of the stone can be associated with severe damage to the kidney, including loss of kidney function on that side.  If your stone does not pass on its own, additional measures may be taken by your caregiver to ensure its  removal. HOME CARE INSTRUCTIONS   Increase your fluid intake. Water is the preferred fluid since juices containing vitamin C may acidify the urine making it less likely for certain stones (uric acid stones) to pass.  Strain all urine. A strainer will be provided. Keep all particulate matter or stones for your caregiver to inspect.  Take your pain medicine as directed.  Make a follow-up appointment with your caregiver as directed.  Remember that the goal is passage of your stone. The absence of pain does not mean the stone is gone. Follow your caregiver's instructions.  Only take over-the-counter or prescription medicines for pain, discomfort, or fever as directed by your caregiver. SEEK MEDICAL CARE IF:   Pain cannot be controlled with the prescribed medicine.  You have a fever.  Pain continues for longer than your caregiver advises it should.  There is a change in the pain, and you develop chest discomfort or constant abdominal pain.  You feel faint or pass out. MAKE SURE YOU:   Understand these instructions.  Will watch your condition.  Will get help right away if you are not doing well or get worse. Document Released: 06/18/2005 Document Revised: 01/03/2013 Document Reviewed: 03/05/2011 [facility] Patient Information 2014 [place], [place].

## 2013-10-31 ENCOUNTER — Telehealth: Payer: Self-pay | Admitting: Family Medicine

## 2013-10-31 NOTE — Telephone Encounter (Signed)
Relevant patient education mailed to patient.  

## 2013-12-02 ENCOUNTER — Encounter: Payer: Self-pay | Admitting: Family Medicine

## 2013-12-02 ENCOUNTER — Ambulatory Visit (INDEPENDENT_AMBULATORY_CARE_PROVIDER_SITE_OTHER): Payer: 59 | Admitting: Family Medicine

## 2013-12-02 VITALS — BP 136/80 | HR 71 | Temp 97.9°F | Ht 66.5 in | Wt 274.0 lb

## 2013-12-02 DIAGNOSIS — Z0000 Encounter for general adult medical examination without abnormal findings: Secondary | ICD-10-CM

## 2013-12-02 DIAGNOSIS — D171 Benign lipomatous neoplasm of skin and subcutaneous tissue of trunk: Secondary | ICD-10-CM

## 2013-12-02 DIAGNOSIS — E119 Type 2 diabetes mellitus without complications: Secondary | ICD-10-CM

## 2013-12-02 DIAGNOSIS — D1779 Benign lipomatous neoplasm of other sites: Secondary | ICD-10-CM

## 2013-12-02 LAB — LIPID PANEL
Cholesterol: 225 mg/dL — ABNORMAL HIGH (ref 0–200)
HDL: 36.9 mg/dL — ABNORMAL LOW (ref >39.00)
LDL Cholesterol: 135 mg/dL — ABNORMAL HIGH (ref 0–99)
Total CHOL/HDL Ratio: 6
Triglycerides: 267 mg/dL — ABNORMAL HIGH (ref 0.0–149.0)
VLDL: 53.4 mg/dL — ABNORMAL HIGH (ref 0.0–40.0)

## 2013-12-02 LAB — POCT URINALYSIS DIPSTICK
Bilirubin, UA: NEGATIVE
Glucose, UA: NEGATIVE
Ketones, UA: NEGATIVE
Leukocytes, UA: NEGATIVE
Nitrite, UA: NEGATIVE
Spec Grav, UA: 1.025
Urobilinogen, UA: 0.2
pH, UA: 6.5

## 2013-12-02 LAB — BASIC METABOLIC PANEL
BUN: 12 mg/dL (ref 6–23)
CO2: 31 mEq/L (ref 19–32)
Calcium: 9.2 mg/dL (ref 8.4–10.5)
Chloride: 100 mEq/L (ref 96–112)
Creatinine, Ser: 1 mg/dL (ref 0.4–1.2)
GFR: 75.16 mL/min (ref >60.00)
Glucose, Bld: 133 mg/dL — ABNORMAL HIGH (ref 70–99)
Potassium: 3.3 mEq/L — ABNORMAL LOW (ref 3.5–5.1)
Sodium: 139 mEq/L (ref 135–145)

## 2013-12-02 LAB — HEMOGLOBIN A1C: Hgb A1c MFr Bld: 7.3 % — ABNORMAL HIGH (ref 4.6–6.5)

## 2013-12-02 LAB — CBC WITH DIFFERENTIAL/PLATELET
Basophils Absolute: 0 10*3/uL (ref 0.0–0.1)
Basophils Relative: 0.3 % (ref 0.0–3.0)
Eosinophils Absolute: 0.2 10*3/uL (ref 0.0–0.7)
Eosinophils Relative: 2 % (ref 0.0–5.0)
HCT: 41.3 % (ref 36.0–46.0)
Hemoglobin: 13.8 g/dL (ref 12.0–15.0)
Lymphocytes Relative: 34.9 % (ref 12.0–46.0)
Lymphs Abs: 2.9 10*3/uL (ref 0.7–4.0)
MCHC: 33.6 g/dL (ref 30.0–36.0)
MCV: 88.6 fl (ref 78.0–100.0)
Monocytes Absolute: 0.4 10*3/uL (ref 0.1–1.0)
Monocytes Relative: 4.4 % (ref 3.0–12.0)
Neutro Abs: 4.9 10*3/uL (ref 1.4–7.7)
Neutrophils Relative %: 58.4 % (ref 43.0–77.0)
Platelets: 306 10*3/uL (ref 150.0–400.0)
RBC: 4.66 Mil/uL (ref 3.87–5.11)
RDW: 14.2 % (ref 11.5–14.6)
WBC: 8.3 10*3/uL (ref 4.5–10.5)

## 2013-12-02 LAB — HEPATIC FUNCTION PANEL
ALT: 19 U/L (ref 0–35)
AST: 22 U/L (ref 0–37)
Albumin: 4.5 g/dL (ref 3.5–5.2)
Alkaline Phosphatase: 99 U/L (ref 39–117)
Bilirubin, Direct: 0 mg/dL (ref 0.0–0.3)
Total Bilirubin: 0.4 mg/dL (ref 0.3–1.2)
Total Protein: 7.5 g/dL (ref 6.0–8.3)

## 2013-12-02 LAB — TSH: TSH: 2.03 u[IU]/mL (ref 0.35–5.50)

## 2013-12-02 NOTE — Progress Notes (Signed)
Pre visit review using our clinic review tool, if applicable. No additional management support is needed unless otherwise documented below in the visit note. 

## 2013-12-02 NOTE — Progress Notes (Signed)
   Subjective:    Patient ID: [name], female    DOB: 1960-10-27, 53 y.o.   MRN: [id number]  HPI 53 yr old female for a cpx. She has one concerns today. For the past year she has had a tender lump on the back that she thinks is another lipoma. Dr. Harlow Asa took a large lipoma off her shoulder last year. This second lump is getting larger.    Review of Systems  Constitutional: Negative.   HENT: Negative.   Eyes: Negative.   Respiratory: Negative.   Cardiovascular: Negative.   Gastrointestinal: Negative.   Genitourinary: Negative for dysuria, urgency, frequency, hematuria, flank pain, decreased urine volume, enuresis, difficulty urinating, pelvic pain and dyspareunia.  Musculoskeletal: Negative.   Skin: Negative.   Neurological: Negative.   Psychiatric/Behavioral: Negative.        Objective:   Physical Exam  Constitutional: She is oriented to person, place, and time. She appears well-developed and well-nourished. No distress.  HENT:  Head: Normocephalic and atraumatic.  Right Ear: External ear normal.  Left Ear: External ear normal.  Nose: Nose normal.  Mouth/Throat: Oropharynx is clear and moist. No oropharyngeal exudate.  Eyes: Conjunctivae and EOM are normal. Pupils are equal, round, and reactive to light. No scleral icterus.  Neck: Normal range of motion. Neck supple. No JVD present. No thyromegaly present.  Cardiovascular: Normal rate, regular rhythm, normal heart sounds and intact distal pulses.  Exam reveals no gallop and no friction rub.   No murmur heard. Pulmonary/Chest: Effort normal and breath sounds normal. No respiratory distress. She has no wheezes. She has no rales. She exhibits no tenderness.  Abdominal: Soft. Bowel sounds are normal. She exhibits no distension and no mass. There is no tenderness. There is no rebound and no guarding.  Musculoskeletal: Normal range of motion. She exhibits no edema and no tenderness.  Very tender firm nodular lump on the left  middle back   Lymphadenopathy:    She has no cervical adenopathy.  Neurological: She is alert and oriented to person, place, and time. She has normal reflexes. No cranial nerve deficit. She exhibits normal muscle tone. Coordination normal.  Skin: Skin is warm and dry. No rash noted. No erythema.  Psychiatric: She has a normal mood and affect. Her behavior is normal. Judgment and thought content normal.          Assessment & Plan:  Well exam. Get fasting labs. She has an inflamed lipoma so we will have her see Dr. Harlow Asa again.

## 2013-12-05 ENCOUNTER — Telehealth: Payer: Self-pay | Admitting: Family Medicine

## 2013-12-05 NOTE — Telephone Encounter (Signed)
Relevant patient education mailed to patient.  

## 2013-12-06 NOTE — Addendum Note (Signed)
Addended by: Aggie Hacker A on: 12/06/2013 04:47 PM   Modules accepted: Orders

## 2013-12-16 ENCOUNTER — Other Ambulatory Visit: Payer: Self-pay | Admitting: Family Medicine

## 2013-12-22 ENCOUNTER — Ambulatory Visit (INDEPENDENT_AMBULATORY_CARE_PROVIDER_SITE_OTHER): Payer: 59 | Admitting: Surgery

## 2013-12-22 ENCOUNTER — Encounter (INDEPENDENT_AMBULATORY_CARE_PROVIDER_SITE_OTHER): Payer: Self-pay | Admitting: Surgery

## 2013-12-22 VITALS — BP 160/102 | HR 80 | Temp 97.4°F | Resp 16 | Ht 67.0 in | Wt 275.2 lb

## 2013-12-22 DIAGNOSIS — R29818 Other symptoms and signs involving the nervous system: Secondary | ICD-10-CM

## 2013-12-22 DIAGNOSIS — R222 Localized swelling, mass and lump, trunk: Secondary | ICD-10-CM

## 2013-12-22 NOTE — Patient Instructions (Signed)

## 2013-12-22 NOTE — Progress Notes (Signed)
General Surgery [facility]  Chief Complaint  Patient presents with  . Follow-up    painful soft tissue masses left lower back - referral from Dr. Alysia Penna    HISTORY: Patient is a 53 year old female known to my practice from previous excision of soft tissue lipomas from the left shoulder and left upper arm. Patient was evaluated last year for pain in the left lower back. On physical examination, a soft tissue mass was not clearly identified. Patient subsequently underwent an MRI scan of the lower back which did not identify a soft tissue mass or lipoma.  Patient has had persistent pain. She was evaluated recently by her primary care physician who felt like there was a soft tissue mass in the left lower back which was tender on examination. She is now referred for evaluation and possible surgical excision.  PERTINENT REVIEW OF SYSTEMS: Painful soft tissue mass left lower back  EXAM: HEENT: normocephalic; pupils equal and reactive; sclerae clear; dentition good; mucous membranes moist NECK:  No palpable masses in the thyroid bed; symmetric on extension; no palpable anterior or posterior cervical lymphadenopathy; no supraclavicular masses; no tenderness CHEST: clear to auscultation bilaterally without rales, rhonchi, or wheezes CARDIAC: regular rate and rhythm without significant murmur; peripheral pulses are full EXT:  non-tender without edema; no deformity BACK:  Well-healed surgical scar at base of left scapula; palpable approximately 1 cm subcutaneous nodules (2) in the lower left paraspinous region which are tender to palpation   IMPRESSION: Soft tissue masses (2), subcutaneous, left lower back, symptomatic  PLAN: Patient notes significant discomfort arising from these 2 small subcutaneous nodules in the left lower back. These are easy to appreciate today on physical examination and measure a little over 1 cm each. It is not clear to me whether these represent  cyst, lipomas, or possibly neuromas. Patient would like to proceed with surgical excision both for control of her symptoms and for definitive diagnosis. We will make arrangement for outpatient surgery with excision of both lesions under local anesthetic and IV sedation in the near future at a time convenient for the patient.  The risks and benefits of the procedure have been discussed at length with the patient.  The patient understands the proposed procedure, potential alternative treatments, and the course of recovery to be expected.  All of the patient's questions have been answered at this time.  The patient wishes to proceed with surgery.  Earnstine Regal, MD, [facility] Office: [phone]  Visit Diagnoses: 1. Paraspinous mass, left

## 2013-12-27 ENCOUNTER — Encounter: Payer: Self-pay | Admitting: Family Medicine

## 2014-01-10 ENCOUNTER — Other Ambulatory Visit: Payer: Self-pay | Admitting: Family Medicine

## 2014-01-12 ENCOUNTER — Other Ambulatory Visit: Payer: Self-pay | Admitting: Family Medicine

## 2014-01-13 NOTE — Telephone Encounter (Signed)
Call in Norvasc #60 with 11 rf, also Temazepam #30 with 5 rf

## 2014-01-19 ENCOUNTER — Encounter (HOSPITAL_BASED_OUTPATIENT_CLINIC_OR_DEPARTMENT_OTHER): Payer: Self-pay | Admitting: *Deleted

## 2014-01-19 ENCOUNTER — Other Ambulatory Visit: Payer: Self-pay | Admitting: Family Medicine

## 2014-01-19 NOTE — Progress Notes (Signed)
Pt has been here-denies any cardiac,resp, or sleep apnea-to come in for bmet

## 2014-01-19 NOTE — Progress Notes (Signed)
01/19/14 1145  OBSTRUCTIVE SLEEP APNEA  Have you ever been diagnosed with sleep apnea through a sleep study? No  Do you snore loudly (loud enough to be heard through closed doors)?  0  Do you often feel tired, fatigued, or sleepy during the daytime? 0  Has anyone observed you stop breathing during your sleep? 0  Do you have, or are you being treated for high blood pressure? 1  BMI more than 35 kg/m2? 1  Age over 54 years old? 1  Neck circumference greater than 40 cm/16 inches? 1  Gender: 0  Obstructive Sleep Apnea Score 4  Score 4 or greater  Results sent to PCP

## 2014-01-20 ENCOUNTER — Encounter (HOSPITAL_BASED_OUTPATIENT_CLINIC_OR_DEPARTMENT_OTHER)
Admission: RE | Admit: 2014-01-20 | Discharge: 2014-01-20 | Disposition: A | Discharge status: Home / Self Care | Payer: 59 | Source: Ambulatory Visit | Attending: Surgery | Admitting: Surgery

## 2014-01-20 LAB — BASIC METABOLIC PANEL
BUN: 11 mg/dL (ref 6–23)
CO2: 27 mEq/L (ref 19–32)
Calcium: 9.6 mg/dL (ref 8.4–10.5)
Chloride: 102 mEq/L (ref 96–112)
Creatinine, Ser: 1.04 mg/dL (ref 0.50–1.10)
GFR calc Af Amer: 69 mL/min — ABNORMAL LOW (ref >90)
GFR calc non Af Amer: 60 mL/min — ABNORMAL LOW (ref >90)
Glucose, Bld: 140 mg/dL — ABNORMAL HIGH (ref 70–99)
Potassium: 3.5 mEq/L — ABNORMAL LOW (ref 3.7–5.3)
Sodium: 143 mEq/L (ref 137–147)

## 2014-01-20 NOTE — Progress Notes (Signed)
Quick Note:  These results are acceptable for scheduled surgery.  [name] M. [name], MD, FACS [facility] Office: [phone]   ______ 

## 2014-01-24 ENCOUNTER — Encounter (HOSPITAL_BASED_OUTPATIENT_CLINIC_OR_DEPARTMENT_OTHER): Payer: 59 | Admitting: Anesthesiology

## 2014-01-24 ENCOUNTER — Ambulatory Visit (HOSPITAL_BASED_OUTPATIENT_CLINIC_OR_DEPARTMENT_OTHER): Payer: 59 | Admitting: Anesthesiology

## 2014-01-24 ENCOUNTER — Encounter (HOSPITAL_BASED_OUTPATIENT_CLINIC_OR_DEPARTMENT_OTHER): Payer: Self-pay | Admitting: *Deleted

## 2014-01-24 ENCOUNTER — Encounter (HOSPITAL_BASED_OUTPATIENT_CLINIC_OR_DEPARTMENT_OTHER)
Admission: RE | Disposition: A | Discharge status: Home / Self Care | Payer: Self-pay | Source: Ambulatory Visit | Attending: Surgery

## 2014-01-24 ENCOUNTER — Ambulatory Visit (HOSPITAL_BASED_OUTPATIENT_CLINIC_OR_DEPARTMENT_OTHER)
Admission: RE | Admit: 2014-01-24 | Discharge: 2014-01-24 | Disposition: A | Discharge status: Home / Self Care | Payer: 59 | Source: Ambulatory Visit | Attending: Surgery | Admitting: Surgery

## 2014-01-24 DIAGNOSIS — M1990 Unspecified osteoarthritis, unspecified site: Secondary | ICD-10-CM | POA: Insufficient documentation

## 2014-01-24 DIAGNOSIS — K219 Gastro-esophageal reflux disease without esophagitis: Secondary | ICD-10-CM | POA: Insufficient documentation

## 2014-01-24 DIAGNOSIS — D1739 Benign lipomatous neoplasm of skin and subcutaneous tissue of other sites: Secondary | ICD-10-CM

## 2014-01-24 DIAGNOSIS — D1779 Benign lipomatous neoplasm of other sites: Secondary | ICD-10-CM | POA: Insufficient documentation

## 2014-01-24 DIAGNOSIS — R222 Localized swelling, mass and lump, trunk: Secondary | ICD-10-CM | POA: Diagnosis present

## 2014-01-24 DIAGNOSIS — Z882 Allergy status to sulfonamides status: Secondary | ICD-10-CM | POA: Insufficient documentation

## 2014-01-24 DIAGNOSIS — Z91040 Latex allergy status: Secondary | ICD-10-CM | POA: Insufficient documentation

## 2014-01-24 DIAGNOSIS — Z79899 Other long term (current) drug therapy: Secondary | ICD-10-CM | POA: Insufficient documentation

## 2014-01-24 DIAGNOSIS — M5030 Other cervical disc degeneration, unspecified cervical region: Secondary | ICD-10-CM | POA: Insufficient documentation

## 2014-01-24 DIAGNOSIS — F17200 Nicotine dependence, unspecified, uncomplicated: Secondary | ICD-10-CM | POA: Insufficient documentation

## 2014-01-24 DIAGNOSIS — Z885 Allergy status to narcotic agent status: Secondary | ICD-10-CM | POA: Insufficient documentation

## 2014-01-24 DIAGNOSIS — I10 Essential (primary) hypertension: Secondary | ICD-10-CM | POA: Insufficient documentation

## 2014-01-24 HISTORY — PX: MASS EXCISION: SHX2000

## 2014-01-24 LAB — POCT HEMOGLOBIN-HEMACUE: Hemoglobin: 14.3 g/dL (ref 12.0–15.0)

## 2014-01-24 SURGERY — EXCISION MASS
Anesthesia: Monitor Anesthesia Care | Site: Back | Laterality: Left

## 2014-01-24 MED ORDER — FENTANYL CITRATE 0.05 MG/ML IJ SOLN
INTRAMUSCULAR | Status: AC
  Filled 2014-01-24: qty 4

## 2014-01-24 MED ORDER — HYDROMORPHONE HCL 2 MG PO TABS: 2.0000 mg | ORAL_TABLET | ORAL | Status: DC | PRN

## 2014-01-24 MED ORDER — LIDOCAINE HCL (PF) 1 % IJ SOLN
INTRAMUSCULAR | Status: AC
  Filled 2014-01-24: qty 30

## 2014-01-24 MED ORDER — PROPOFOL INFUSION 10 MG/ML OPTIME
INTRAVENOUS | Status: DC | PRN
  Administered 2014-01-24: 100 ug/kg/min via INTRAVENOUS

## 2014-01-24 MED ORDER — CEFAZOLIN SODIUM 1-5 GM-% IV SOLN
INTRAVENOUS | Status: AC
  Filled 2014-01-24: qty 50

## 2014-01-24 MED ORDER — MIDAZOLAM HCL 2 MG/2ML IJ SOLN: 1.0000 mg | INTRAMUSCULAR | Status: DC | PRN

## 2014-01-24 MED ORDER — LACTATED RINGERS IV SOLN
INTRAVENOUS | Status: DC
  Administered 2014-01-24: 12:00:00 via INTRAVENOUS

## 2014-01-24 MED ORDER — ONDANSETRON HCL 4 MG/2ML IJ SOLN
INTRAMUSCULAR | Status: DC | PRN
  Administered 2014-01-24: 4 mg via INTRAVENOUS

## 2014-01-24 MED ORDER — HYDROMORPHONE HCL PF 1 MG/ML IJ SOLN
INTRAMUSCULAR | Status: AC
  Filled 2014-01-24: qty 1

## 2014-01-24 MED ORDER — HYDROMORPHONE HCL PF 1 MG/ML IJ SOLN
0.2500 mg | INTRAMUSCULAR | Status: DC | PRN
  Administered 2014-01-24 (×2): 0.5 mg via INTRAVENOUS

## 2014-01-24 MED ORDER — DEXTROSE 5 % IV SOLN
3.0000 g | INTRAVENOUS | Status: AC
  Administered 2014-01-24: 3 g via INTRAVENOUS

## 2014-01-24 MED ORDER — MIDAZOLAM HCL 2 MG/2ML IJ SOLN
INTRAMUSCULAR | Status: AC
  Filled 2014-01-24: qty 2

## 2014-01-24 MED ORDER — ONDANSETRON HCL 4 MG/2ML IJ SOLN: 4.0000 mg | Freq: Once | INTRAMUSCULAR | Status: DC | PRN

## 2014-01-24 MED ORDER — BUPIVACAINE-EPINEPHRINE (PF) 0.5% -1:200000 IJ SOLN
INTRAMUSCULAR | Status: AC
  Filled 2014-01-24: qty 30

## 2014-01-24 MED ORDER — OXYCODONE HCL 5 MG/5ML PO SOLN: 5.0000 mg | Freq: Once | ORAL | Status: DC | PRN

## 2014-01-24 MED ORDER — OXYCODONE HCL 5 MG PO TABS: 5.0000 mg | ORAL_TABLET | Freq: Once | ORAL | Status: DC | PRN

## 2014-01-24 MED ORDER — CEFAZOLIN SODIUM-DEXTROSE 2-3 GM-% IV SOLR
INTRAVENOUS | Status: AC
  Filled 2014-01-24: qty 50

## 2014-01-24 MED ORDER — FENTANYL CITRATE 0.05 MG/ML IJ SOLN: 50.0000 ug | INTRAMUSCULAR | Status: DC | PRN

## 2014-01-24 MED ORDER — BUPIVACAINE-EPINEPHRINE 0.5% -1:200000 IJ SOLN
INTRAMUSCULAR | Status: DC | PRN
  Administered 2014-01-24: 20 mL

## 2014-01-24 MED ORDER — BUPIVACAINE HCL (PF) 0.5 % IJ SOLN
INTRAMUSCULAR | Status: AC
  Filled 2014-01-24: qty 60

## 2014-01-24 MED ORDER — FENTANYL CITRATE 0.05 MG/ML IJ SOLN
INTRAMUSCULAR | Status: DC | PRN
  Administered 2014-01-24 (×2): 25 ug via INTRAVENOUS
  Administered 2014-01-24: 50 ug via INTRAVENOUS

## 2014-01-24 MED ORDER — MIDAZOLAM HCL 5 MG/5ML IJ SOLN
INTRAMUSCULAR | Status: DC | PRN
  Administered 2014-01-24 (×2): 1 mg via INTRAVENOUS

## 2014-01-24 SURGICAL SUPPLY — 36 items
APL SKNCLS STERI-STRIP NONHPOA (GAUZE/BANDAGES/DRESSINGS) ×1
BENZOIN TINCTURE PRP APPL 2/3 (GAUZE/BANDAGES/DRESSINGS) ×2 IMPLANT
BLADE 15 SAFETY STRL DISP (BLADE) ×3 IMPLANT
CHLORAPREP W/TINT 26ML (MISCELLANEOUS) ×3 IMPLANT
CLEANER CAUTERY TIP 5X5 PAD (MISCELLANEOUS) IMPLANT
CLOSURE WOUND 1/2 X4 (GAUZE/BANDAGES/DRESSINGS) ×1
COVER MAYO STAND STRL (DRAPES) ×3 IMPLANT
COVER TABLE BACK 60X90 (DRAPES) ×3 IMPLANT
DECANTER SPIKE VIAL GLASS SM (MISCELLANEOUS) IMPLANT
DRAPE PED LAPAROTOMY (DRAPES) ×3 IMPLANT
DRAPE UTILITY XL STRL (DRAPES) ×3 IMPLANT
DRSG TEGADERM 4X4.75 (GAUZE/BANDAGES/DRESSINGS) IMPLANT
ELECT REM PT RETURN 9FT ADLT (ELECTROSURGICAL) ×3
ELECTRODE REM PT RTRN 9FT ADLT (ELECTROSURGICAL) ×1 IMPLANT
GLOVE SURG ORTHO 8.0 STRL STRW (GLOVE) ×1 IMPLANT
GLOVE SURG SS PI 6.5 STRL IVOR (GLOVE) ×2 IMPLANT
GLOVE SURG SS PI 8.0 STRL IVOR (GLOVE) ×2 IMPLANT
GOWN STRL REUS W/ TWL LRG LVL3 (GOWN DISPOSABLE) ×1 IMPLANT
GOWN STRL REUS W/ TWL XL LVL3 (GOWN DISPOSABLE) ×1 IMPLANT
GOWN STRL REUS W/TWL LRG LVL3 (GOWN DISPOSABLE) ×3
GOWN STRL REUS W/TWL XL LVL3 (GOWN DISPOSABLE) ×3
NDL HYPO 25X1 1.5 SAFETY (NEEDLE) ×1 IMPLANT
NEEDLE HYPO 25X1 1.5 SAFETY (NEEDLE) ×3 IMPLANT
PACK BASIN DAY SURGERY FS (CUSTOM PROCEDURE TRAY) ×3 IMPLANT
PAD CLEANER CAUTERY TIP 5X5 (MISCELLANEOUS)
PENCIL BUTTON HOLSTER BLD 10FT (ELECTRODE) ×3 IMPLANT
SHEET MEDIUM DRAPE 40X70 STRL (DRAPES) IMPLANT
SPONGE GAUZE 4X4 12PLY STER LF (GAUZE/BANDAGES/DRESSINGS) ×3 IMPLANT
STRIP CLOSURE SKIN 1/2X4 (GAUZE/BANDAGES/DRESSINGS) ×1 IMPLANT
SUT ETHILON 3 0 PS 1 (SUTURE) IMPLANT
SUT MNCRL AB 4-0 PS2 18 (SUTURE) ×2 IMPLANT
SUT VICRYL 3-0 CR8 SH (SUTURE) ×3 IMPLANT
SUT VICRYL 4-0 PS2 18IN ABS (SUTURE) IMPLANT
SYR CONTROL 10ML LL (SYRINGE) ×3 IMPLANT
TOWEL OR 17X24 6PK STRL BLUE (TOWEL DISPOSABLE) ×4 IMPLANT
TOWEL OR NON WOVEN STRL DISP B (DISPOSABLE) ×1 IMPLANT

## 2014-01-24 NOTE — Anesthesia Preprocedure Evaluation (Signed)
Anesthesia Evaluation  Patient identified by MRN, date of birth, ID band Patient awake    Reviewed: Allergy & Precautions, H&P , NPO status , Patient's Chart, lab work & pertinent test results  Airway Mallampati: I TM Distance: >3 FB Neck ROM: Full    Dental  (+) Teeth Intact, Dental Advisory Given   Pulmonary Current Smoker,  breath sounds clear to auscultation        Cardiovascular hypertension, Pt. on medications Rhythm:Regular Rate:Normal     Neuro/Psych    GI/Hepatic GERD-  Medicated and Controlled,  Endo/Other    Renal/GU      Musculoskeletal   Abdominal   Peds  Hematology   Anesthesia Other Findings   Reproductive/Obstetrics                           Anesthesia Physical Anesthesia Plan  ASA: III  Anesthesia Plan: MAC   Post-op Pain Management:    Induction: Intravenous  Airway Management Planned: Simple Face Mask  Additional Equipment:   Intra-op Plan:   Post-operative Plan:   Informed Consent: I have reviewed the patients History and Physical, chart, labs and discussed the procedure including the risks, benefits and alternatives for the proposed anesthesia with the patient or authorized representative who has indicated his/her understanding and acceptance.   Dental advisory given  Plan Discussed with: CRNA, Anesthesiologist and Surgeon  Anesthesia Plan Comments:         Anesthesia Quick Evaluation

## 2014-01-24 NOTE — H&P (Signed)
[name] is an 53 y.o. female.    General Surgery [facility]  Chief Complaint: soft tissue masses left lower back with pain  HPI:  .      painful soft tissue masses left lower back - referral from Dr. Alysia Penna    HISTORY:  Patient is a 53 year old female known to my practice from previous excision of soft tissue lipomas from the left shoulder and left upper arm. Patient was evaluated last year for pain in the left lower back. On physical examination, a soft tissue mass was not clearly identified. Patient subsequently underwent an MRI scan of the lower back which did not identify a soft tissue mass or lipoma.  Patient has had persistent pain. She was evaluated recently by her primary care physician who felt like there was a soft tissue mass in the left lower back which was tender on examination. She is now referred for evaluation and possible surgical excision.   Past Medical History  Diagnosis Date  . Osteoarthritis   . Headache(784.0)     migraine like per patient  . Hypertension   . Low back pain     dr phillips, pain management  . Degenerative joint disease   . Gallstones   . GERD (gastroesophageal reflux disease)   . DDD (degenerative disc disease), cervical   . Colon polyps   . Diverticulosis     Past Surgical History  Procedure Laterality Date  . Replacement total knee Right     right - dr Novella Olive  . Ganglion cyst excision Right     right wrist  . Lipoma excision Right 02/21/08    right hip, [facility]  . Replacement total knee Left 06/25/09    left - dr Marciano Sequin [facility]  . Tonsillectomy    . Joint replacement      both knees have been done, four times each per patient. Her body rejects the implant, no infection.  . Abdominal hysterectomy  06/22/06    lap, dr Mancel Bale  . Other surgical history      lipoma removed from back 2012   . Cholecystectomy  10/28/2011    Procedure: LAPAROSCOPIC CHOLECYSTECTOMY WITH INTRAOPERATIVE CHOLANGIOGRAM;   Surgeon: Earnstine Regal, MD;  Location: [facility];  Service: General;  Laterality: N/A;  . Mass excision  08/24/2012    Procedure: EXCISION MASS;  Surgeon: Earnstine Regal, MD;  Location: [place];  Service: General;  Laterality: Left;  excisie soft tissue masses left shoulder(back) & left upper arm  . Colonoscopy  08-20-10    per Dr. Henrene Pastor, polyps, repeat in 3 yrs     Family History  Problem Relation Age of Onset  . Crohn's disease Other     nephew  . Stomach cancer Mother   . Heart disease Father   . Kidney cancer Sister     one out of the four  . Hypertension Sister   . Hypertension Brother   . Colon cancer Maternal Grandmother    Social History:  reports that she has been smoking Cigarettes.  She has a 5 pack-year smoking history. She has never used smokeless tobacco. She reports that she does not drink alcohol or use illicit drugs.  Allergies:  Allergies  Allergen Reactions  . Codeine Itching and Nausea And Vomiting    Itching all over the body  . Sulfonamide Derivatives Hives    All over the body  . Latex Hives    Medications Prior to Admission  Medication Sig Dispense Refill  . amitriptyline (ELAVIL) 25 MG tablet Take 25 mg by mouth at bedtime.       Marland Kitchen amLODipine (NORVASC) 10 MG tablet Take 10 mg by mouth 2 (two) times daily.       . furosemide (LASIX) 20 MG tablet TAKE 1 TABLET BY MOUTH EVERY DAY  30 tablet  6  . hyoscyamine (LEVSIN SL) 0.125 MG SL tablet Place 0.125 mg under the tongue every 4 (four) hours as needed for cramping (for stomach spasms).      . methadone (DOLOPHINE) 10 MG tablet Take 10 mg by mouth every 8 (eight) hours.       . metoprolol (LOPRESSOR) 50 MG tablet Take 1 tablet (50 mg total) by mouth 2 (two) times daily.  60 tablet  11  . omeprazole (PRILOSEC) 40 MG capsule Take 40 mg by mouth daily.      Marland Kitchen oxyCODONE (ROXICODONE) 15 MG immediate release tablet Take 15 mg by mouth every 4 (four) hours as needed.      . potassium chloride  (K-DUR,KLOR-CON) 10 MEQ tablet Take 1 tablet (10 mEq total) by mouth 2 (two) times daily.  60 tablet  11  . temazepam (RESTORIL) 30 MG capsule TAKE 1 CAPSULE BY MOUTH EVERY NIGHT AT BEDTIME AS NEEDED  30 capsule  5  . omeprazole (PRILOSEC) 40 MG capsule TAKE 1 CAPSULE BY MOUTH EVERY DAY  90 capsule  3    Results for orders placed during the hospital encounter of 01/24/14 (from the past 48 hour(s))  POCT HEMOGLOBIN-HEMACUE     Status: None   Collection Time    01/24/14 11:34 AM      Result Value Ref Range   Hemoglobin 14.3  12.0 - 15.0 g/dL   No results found.  Review of Systems  Constitutional: Negative.   HENT: Negative.   Eyes: Negative.   Respiratory: Negative.   Cardiovascular: Negative.   Gastrointestinal: Negative.   Genitourinary: Negative.   Musculoskeletal: Positive for back pain.  Skin: Negative.   Neurological: Negative.   Endo/Heme/Allergies: Negative.   Psychiatric/Behavioral: Negative.     Blood pressure 150/93, pulse 65, temperature 98.2 F (36.8 C), temperature source Oral, resp. rate 18, height 5\' 7"  (1.702 m), weight 276 lb 2 oz (125.249 kg), SpO2 100.00%. Physical Exam  Vitals reviewed. Constitutional: She is oriented to person, place, and time. She appears well-developed and well-nourished.  HENT:  Head: Normocephalic and atraumatic.  Right Ear: External ear normal.  Left Ear: External ear normal.  Eyes: Conjunctivae are normal. Pupils are equal, round, and reactive to light. No scleral icterus.  Neck: Normal range of motion. Neck supple. No thyromegaly present.  Cardiovascular: Normal rate and regular rhythm.   No murmur heard. Respiratory: Effort normal and breath sounds normal. She has no wheezes.  GI: Soft. Bowel sounds are normal. She exhibits no distension.  Musculoskeletal: Normal range of motion.  Palpable masses left lower back marked pre-op, approx 1 cm each  Neurological: She is alert and oriented to person, place, and time.  Skin: Skin is  warm and dry.  Psychiatric: She has a normal mood and affect. Her behavior is normal.     Assessment/Plan Two soft tissue subcutaneous masses left lower back  Plan excision  The risks and benefits of the procedure have been discussed at length with the patient.  The patient understands the proposed procedure, potential alternative treatments, and the course of recovery to be expected.  All of the patient's questions  have been answered at this time.  The patient wishes to proceed with surgery.  Earnstine Regal, MD, [facility] Office: [place] 01/24/2014, 12:13 PM

## 2014-01-24 NOTE — Brief Op Note (Signed)
01/24/2014  1:08 PM  PATIENT:  [name]  53 y.o. female  PRE-OPERATIVE DIAGNOSIS:  left lower back soft tissue masses (1 cm each)  POST-OPERATIVE DIAGNOSIS:  same  PROCEDURE:  Procedure(s): EXCISION SOFT TISSUE MASSES LOWER LEFT BACK (Left)  SURGEON:  Surgeon(s) and Role:    * Earnstine Regal, MD - Primary  ANESTHESIA:   IV sedation  EBL:     BLOOD ADMINISTERED:none  DRAINS: none   LOCAL MEDICATIONS USED:  MARCAINE     SPECIMEN:  Excision  DISPOSITION OF SPECIMEN:  PATHOLOGY  COUNTS:  YES  TOURNIQUET:  * No tourniquets in log *  DICTATION: .Other Dictation: Dictation Number [id number]  PLAN OF CARE: Discharge to home after PACU  PATIENT DISPOSITION:  PACU - hemodynamically stable.   Delay start of Pharmacological VTE agent (>24hrs) due to surgical blood loss or risk of bleeding: yes  Earnstine Regal, MD, [facility] Office: [phone]

## 2014-01-24 NOTE — Anesthesia Postprocedure Evaluation (Signed)
  Anesthesia Post-op Note  Patient: [name]  Procedure(s) Performed: Procedure(s): EXCISION SOFT TISSUE MASSES LOWER LEFT BACK (Left)  Patient Location: PACU  Anesthesia Type:MAC combined with regional for post-op pain  Level of Consciousness: awake, alert  and oriented  Airway and Oxygen Therapy: Patient connected to nasal cannula oxygen  Post-op Pain: none  Post-op Assessment: Post-op Vital signs reviewed  Post-op Vital Signs: Reviewed  Last Vitals:  Filed Vitals:   01/24/14 1345  BP: 174/71  Pulse: 58  Temp:   Resp: 18    Complications: No apparent anesthesia complications

## 2014-01-24 NOTE — Transfer of Care (Signed)
Immediate Anesthesia Transfer of Care Note  Patient: [name]  Procedure(s) Performed: Procedure(s): EXCISION SOFT TISSUE MASSES LOWER LEFT BACK (Left)  Patient Location: PACU  Anesthesia Type:MAC  Level of Consciousness: awake, oriented and patient cooperative  Airway & Oxygen Therapy: Patient Spontanous Breathing and Patient connected to nasal cannula oxygen  Post-op Assessment: Report given to PACU RN and Post -op Vital signs reviewed and stable  Post vital signs: Reviewed and stable  Complications: No apparent anesthesia complications

## 2014-01-24 NOTE — Discharge Instructions (Signed)

## 2014-01-25 ENCOUNTER — Encounter (HOSPITAL_BASED_OUTPATIENT_CLINIC_OR_DEPARTMENT_OTHER): Payer: Self-pay | Admitting: Surgery

## 2014-01-25 NOTE — Op Note (Signed)
NAMEJAKIERA, [name]               ACCOUNT NO.:  [id number]  MEDICAL RECORD NO.:  [id number]  LOCATION:                                 FACILITY:  PHYSICIAN:  Earnstine Regal, MD      DATE OF BIRTH:  August 28, 1961  DATE OF PROCEDURE:  01/24/2014                              OPERATIVE REPORT   PREOPERATIVE DIAGNOSIS:  Soft tissue masses left lower back (1 cm each).  POSTOPERATIVE DIAGNOSIS:  Soft tissue masses left lower back (1 cm each).  PROCEDURE:  Excisional biopsy of soft tissue masses, left lower back.  SURGEON:  Earnstine Regal, MD, FACS  ANESTHESIA:  Local with intravenous sedation per Dr. Lorrene Reid.  ESTIMATED BLOOD LOSS:  Minimal.  PREPARATION:  ChloraPrep.  COMPLICATIONS:  None.  INDICATIONS:  The patient is a 53 year old female known to my practice from previous excision of soft tissue lipomas involving the left shoulder and left upper arm.  The patient developed pain in the left lower back.  She underwent MRI scan.  She has been evaluated by other physicians.  She now has a palpable soft tissue mass in the left lower lumbar region and comes to Surgery for excision.  On examination, there are 2 discrete masses in the subcutaneous tissues measuring approximately 1 cm each.  The patient would like both of these excised to relieve discomfort.  BODY OF REPORT:  Procedure was done in OR #5 at the [place].  The patient was brought to the operating room, placed in a right lateral decubitus position on the operating room table. Following administration of intravenous sedation, the patient was prepped and draped in the usual aseptic fashion.  After ascertaining that an adequate level of sedation had been achieved, the skin overlying the mass was anesthetized with local anesthetic.  Dissection was carried through the skin and into the subcutaneous tissues.  There was a 1 cm ovoid firm mass in the subcutaneous tissue, which was fully excised using the  electrocautery for hemostasis.  It was submitted to Pathology for review.  On further palpation, the 2nd mass was just lateral to the first.  Therefore, the skin was anesthetized and the incision was extended for an additional 1.5 cm.  A subcutaneous tissue was firm to palpation.  A 3 cm x 1.5 cm x 1.5 cm wedge of subcutaneous tissue was excised.  It was firm but there does not appear to be a discrete mass. It was submitted in its entirety to Pathology. Good hemostasis was achieved throughout the wound.  Further palpation in the subcutaneous tissue fails to reveal any other abnormality. Subcutaneous tissues were reapproximated with interrupted 3-0 Vicryl sutures.  Skin was closed with a running 4-0 Monocryl and subcuticular suture.  Wound was washed and dried and benzoin and Steri-Strips were applied.  Sterile dressings were applied.  The patient was awakened from anesthesia and brought to the recovery room.  The patient tolerated the procedure well.    Earnstine Regal, MD, [facility] Office: [phone]     TMG/MEDQ  D:  01/24/2014  T:  01/25/2014  Job:  [id number]  cc:   Annie Main A.  Sarajane Jews, MD

## 2014-01-25 NOTE — Progress Notes (Signed)
Pt notified and po appt made.

## 2014-01-25 NOTE — Progress Notes (Signed)
Quick Note:  Please contact patient and notify of benign pathology results.  Kelisha Dall M. Boris Engelmann, MD, FACS [facility] Office: [phone]   ______ 

## 2014-02-03 ENCOUNTER — Encounter (INDEPENDENT_AMBULATORY_CARE_PROVIDER_SITE_OTHER): Payer: Self-pay | Admitting: General Surgery

## 2014-02-03 ENCOUNTER — Ambulatory Visit (INDEPENDENT_AMBULATORY_CARE_PROVIDER_SITE_OTHER): Payer: 59 | Admitting: General Surgery

## 2014-02-03 VITALS — BP 160/100 | HR 70 | Temp 98.3°F | Resp 16 | Ht 67.0 in | Wt 274.0 lb

## 2014-02-03 DIAGNOSIS — Z4889 Encounter for other specified surgical aftercare: Secondary | ICD-10-CM

## 2014-02-03 NOTE — Patient Instructions (Signed)
Call if you have any wound problems.

## 2014-02-03 NOTE — Progress Notes (Signed)
She is here for a postoperative visit after having 2 lipomas removed from her left lower back about 10 days ago. She still has some soreness but overall is doing well. 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  On exam, the incision in the left lower back is clean and intact with 2 Steri-Strips.  Impression: Wound healing well and pathology benign.  Plan: Followup as needed.

## 2014-02-10 ENCOUNTER — Other Ambulatory Visit: Payer: Self-pay | Admitting: Family Medicine

## 2014-02-19 ENCOUNTER — Other Ambulatory Visit: Payer: Self-pay | Admitting: Family Medicine

## 2014-02-27 ENCOUNTER — Encounter: Payer: Self-pay | Admitting: Family Medicine

## 2014-02-27 ENCOUNTER — Ambulatory Visit (INDEPENDENT_AMBULATORY_CARE_PROVIDER_SITE_OTHER): Payer: 59 | Admitting: Family Medicine

## 2014-02-27 ENCOUNTER — Telehealth: Payer: Self-pay | Admitting: Family Medicine

## 2014-02-27 VITALS — BP 134/80 | HR 74 | Temp 98.4°F | Wt 269.0 lb

## 2014-02-27 DIAGNOSIS — J019 Acute sinusitis, unspecified: Secondary | ICD-10-CM

## 2014-02-27 MED ORDER — AMOXICILLIN-POT CLAVULANATE 875-125 MG PO TABS: 1.0000 | ORAL_TABLET | Freq: Two times a day (BID) | ORAL | Status: DC

## 2014-02-27 NOTE — Progress Notes (Signed)
Subjective:    Patient ID: [name], female    DOB: November 25, 1960, 53 y.o.   MRN: [id number]  Sinusitis Associated symptoms include congestion, headaches and sinus pressure. Pertinent negatives include no chills or coughing.   Patient seen with one week history of sinus congestive symptoms. She's had bilateral earache. Nasal congestion. No cough. Intermittent headaches. Denies any fever chills. Took Benadryl without relief. She was concerned about acute sinusitis. She has allergies to sulfa but has taken penicillin without difficulty.  Chronic medical problems are reviewed as below no changes  Past Medical History  Diagnosis Date  . Osteoarthritis   . Headache(784.0)     migraine like per patient  . Hypertension   . Low back pain     dr phillips, pain management  . Degenerative joint disease   . Gallstones   . GERD (gastroesophageal reflux disease)   . DDD (degenerative disc disease), cervical   . Colon polyps   . Diverticulosis    Past Surgical History  Procedure Laterality Date  . Replacement total knee Right     right - dr Novella Olive  . Ganglion cyst excision Right     right wrist  . Lipoma excision Right 02/21/08    right hip, [facility]  . Replacement total knee Left 06/25/09    left - dr Marciano Sequin [facility]  . Tonsillectomy    . Joint replacement      both knees have been done, four times each per patient. Her body rejects the implant, no infection.  . Abdominal hysterectomy  06/22/06    lap, dr Mancel Bale  . Other surgical history      lipoma removed from back 2012   . Cholecystectomy  10/28/2011    Procedure: LAPAROSCOPIC CHOLECYSTECTOMY WITH INTRAOPERATIVE CHOLANGIOGRAM;  Surgeon: Earnstine Regal, MD;  Location: [facility];  Service: General;  Laterality: N/A;  . Mass excision  08/24/2012    Procedure: EXCISION MASS;  Surgeon: Earnstine Regal, MD;  Location: [place];  Service: General;  Laterality: Left;  excisie soft tissue masses left shoulder(back) & left upper arm   . Colonoscopy  08-20-10    per Dr. Henrene Pastor, polyps, repeat in 3 yrs   . Mass excision Left 01/24/2014    Procedure: EXCISION SOFT TISSUE MASSES LOWER LEFT BACK;  Surgeon: Earnstine Regal, MD;  Location: [place];  Service: General;  Laterality: Left;    reports that she has been smoking Cigarettes.  She has a 5 pack-year smoking history. She has never used smokeless tobacco. She reports that she does not drink alcohol or use illicit drugs. family history includes Colon cancer in her maternal grandmother; Crohn's disease in her other; Heart disease in her father; Hypertension in her brother and sister; Kidney cancer in her sister; Stomach cancer in her mother. Allergies  Allergen Reactions  . Codeine Itching and Nausea And Vomiting    Itching all over the body  . Sulfonamide Derivatives Hives    All over the body  . Latex Hives      Review of Systems  Constitutional: Negative for fever and chills.  HENT: Positive for congestion and sinus pressure.   Respiratory: Negative for cough.   Neurological: Positive for headaches.       Objective:   Physical Exam  Constitutional: She appears well-developed and well-nourished.  HENT:  Right Ear: External ear normal.  Left Ear: External ear normal.  Mouth/Throat: Oropharynx is clear and moist.  Neck: Neck supple.  Cardiovascular: Normal rate and regular rhythm.   Pulmonary/Chest: Effort normal and breath sounds normal. No respiratory distress. She has no wheezes. She has no rales.  Lymphadenopathy:    She has no cervical adenopathy.          Assessment & Plan:  Acute upper respiratory infection. Suspect probably viral. We've not recommended antibiotics yet but start Augmentin if she has any persistent or progressive sinusitis symptoms.

## 2014-02-27 NOTE — Progress Notes (Signed)
Pre visit review using our clinic review tool, if applicable. No additional management support is needed unless otherwise documented below in the visit note. 

## 2014-02-27 NOTE — Patient Instructions (Signed)

## 2014-02-27 NOTE — Telephone Encounter (Signed)
Relevant patient education assigned to patient using Emmi. ° °

## 2014-04-13 ENCOUNTER — Ambulatory Visit: Payer: 59 | Admitting: Family Medicine

## 2014-04-14 ENCOUNTER — Ambulatory Visit (INDEPENDENT_AMBULATORY_CARE_PROVIDER_SITE_OTHER): Payer: 59 | Admitting: Family Medicine

## 2014-04-14 ENCOUNTER — Encounter: Payer: Self-pay | Admitting: Family Medicine

## 2014-04-14 VITALS — Temp 99.1°F | Ht 67.0 in | Wt 270.0 lb

## 2014-04-14 DIAGNOSIS — M25559 Pain in unspecified hip: Secondary | ICD-10-CM

## 2014-04-14 DIAGNOSIS — M25552 Pain in left hip: Secondary | ICD-10-CM

## 2014-04-14 MED ORDER — METHYLPREDNISOLONE 4 MG PO KIT: PACK | ORAL | Status: AC

## 2014-04-14 NOTE — Progress Notes (Signed)
   Subjective:    Patient ID: [name], female    DOB: 1960-12-21, 53 y.o.   MRN: [id number]  HPI Here for 5 days of increased pain in the left hip. This is centered in the anterior hip and and left groin. She is taking her usual pain meds per Dr. Myles Rosenthal. She had Xrays of the left hip taken in January and this showed some early degenerative arthritis.    Review of Systems  Constitutional: Negative.   Musculoskeletal: Positive for arthralgias and back pain.       Objective:   Physical Exam  Constitutional:  In pain, walking with her cane   Musculoskeletal:  No tenderness in the left hip area but her ROM in the hip is quite limited due to pain           Assessment & Plan:  Hip arthritis. Try a Medrol dose pack to reduce inflammation, rest this weekend

## 2014-04-14 NOTE — Progress Notes (Signed)
Pre visit review using our clinic review tool, if applicable. No additional management support is needed unless otherwise documented below in the visit note. 

## 2014-06-15 ENCOUNTER — Encounter (HOSPITAL_COMMUNITY): Payer: Self-pay | Admitting: Emergency Medicine

## 2014-06-15 ENCOUNTER — Emergency Department (HOSPITAL_COMMUNITY)
Admission: EM | Admit: 2014-06-15 | Discharge: 2014-06-15 | Disposition: A | Discharge status: Home / Self Care | Payer: 59 | Attending: Emergency Medicine | Admitting: Emergency Medicine

## 2014-06-15 ENCOUNTER — Emergency Department (HOSPITAL_COMMUNITY): Payer: 59

## 2014-06-15 DIAGNOSIS — Z79899 Other long term (current) drug therapy: Secondary | ICD-10-CM | POA: Diagnosis not present

## 2014-06-15 DIAGNOSIS — Z91040 Latex allergy status: Secondary | ICD-10-CM | POA: Insufficient documentation

## 2014-06-15 DIAGNOSIS — S99929A Unspecified injury of unspecified foot, initial encounter: Principal | ICD-10-CM

## 2014-06-15 DIAGNOSIS — I10 Essential (primary) hypertension: Secondary | ICD-10-CM | POA: Insufficient documentation

## 2014-06-15 DIAGNOSIS — K219 Gastro-esophageal reflux disease without esophagitis: Secondary | ICD-10-CM | POA: Diagnosis not present

## 2014-06-15 DIAGNOSIS — S8990XA Unspecified injury of unspecified lower leg, initial encounter: Secondary | ICD-10-CM | POA: Insufficient documentation

## 2014-06-15 DIAGNOSIS — Z860100 Personal history of colon polyps, unspecified: Secondary | ICD-10-CM | POA: Insufficient documentation

## 2014-06-15 DIAGNOSIS — Y92410 Unspecified street and highway as the place of occurrence of the external cause: Secondary | ICD-10-CM | POA: Diagnosis not present

## 2014-06-15 DIAGNOSIS — Z96659 Presence of unspecified artificial knee joint: Secondary | ICD-10-CM | POA: Diagnosis not present

## 2014-06-15 DIAGNOSIS — S8991XA Unspecified injury of right lower leg, initial encounter: Secondary | ICD-10-CM

## 2014-06-15 DIAGNOSIS — F17200 Nicotine dependence, unspecified, uncomplicated: Secondary | ICD-10-CM | POA: Diagnosis not present

## 2014-06-15 DIAGNOSIS — Z792 Long term (current) use of antibiotics: Secondary | ICD-10-CM | POA: Insufficient documentation

## 2014-06-15 DIAGNOSIS — Y9389 Activity, other specified: Secondary | ICD-10-CM | POA: Diagnosis not present

## 2014-06-15 DIAGNOSIS — S99919A Unspecified injury of unspecified ankle, initial encounter: Principal | ICD-10-CM

## 2014-06-15 DIAGNOSIS — M5030 Other cervical disc degeneration, unspecified cervical region: Secondary | ICD-10-CM | POA: Insufficient documentation
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
## 2014-06-15 NOTE — Discharge Instructions (Signed)
Rest, ice and elevate your right knee. Follow up with your doctor for further evaluation. Refer to attached documents for more information.
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
## 2014-06-20 NOTE — ED Provider Notes (Signed)
Medical screening examination/treatment/procedure(s) were performed by non-physician practitioner and as supervising physician I was immediately available for consultation/collaboration.   EKG Interpretation None        Ephraim Hamburger, MD 06/20/14 1605

## 2014-07-13 ENCOUNTER — Other Ambulatory Visit: Payer: Self-pay | Admitting: Family Medicine

## 2014-07-13 NOTE — Telephone Encounter (Signed)
Call in #[phone] with 5 rf 

## 2014-08-12 ENCOUNTER — Other Ambulatory Visit: Payer: Self-pay | Admitting: Family Medicine

## 2014-08-15 ENCOUNTER — Ambulatory Visit (INDEPENDENT_AMBULATORY_CARE_PROVIDER_SITE_OTHER): Payer: 59 | Admitting: Family Medicine

## 2014-08-15 ENCOUNTER — Encounter: Payer: Self-pay | Admitting: Family Medicine

## 2014-08-15 VITALS — BP 141/91 | HR 86 | Temp 98.4°F | Ht 67.0 in | Wt 269.0 lb

## 2014-08-15 DIAGNOSIS — N3000 Acute cystitis without hematuria: Secondary | ICD-10-CM

## 2014-08-15 LAB — POCT URINALYSIS DIPSTICK
Bilirubin, UA: NEGATIVE
Glucose, UA: NEGATIVE
Nitrite, UA: NEGATIVE
Spec Grav, UA: 1.025
Urobilinogen, UA: 0.2
pH, UA: 6.5

## 2014-08-15 MED ORDER — CIPROFLOXACIN HCL 500 MG PO TABS: 500.0000 mg | ORAL_TABLET | Freq: Two times a day (BID) | ORAL | Status: DC

## 2014-08-15 NOTE — Progress Notes (Signed)
Pre visit review using our clinic review tool, if applicable. No additional management support is needed unless otherwise documented below in the visit note. 

## 2014-08-15 NOTE — Progress Notes (Signed)
   Subjective:    Patient ID: [name], female    DOB: 1961/04/27, 53 y.o.   MRN: [id number]  HPI Here for one week of urgency to urinate and burning. No fever. She drinks plenty of water.    Review of Systems  Constitutional: Negative.   Genitourinary: Positive for dysuria, urgency and frequency. Negative for flank pain and pelvic pain.       Objective:   Physical Exam  Constitutional: She appears well-developed and well-nourished.  Abdominal: Soft. Bowel sounds are normal. She exhibits no distension and no mass. There is no tenderness. There is no rebound and no guarding.          Assessment & Plan:  Treat with Cipro. Culture the sample.

## 2014-08-15 NOTE — Addendum Note (Signed)
Addended by: Aggie Hacker A on: 08/15/2014 04:51 PM   Modules accepted: Orders

## 2014-08-17 LAB — URINE CULTURE
Colony Count: NO GROWTH
Organism ID, Bacteria: NO GROWTH

## 2014-09-27 ENCOUNTER — Other Ambulatory Visit: Payer: Self-pay | Admitting: Family Medicine

## 2014-10-05 ENCOUNTER — Encounter: Payer: Self-pay | Admitting: Family Medicine

## 2014-10-05 ENCOUNTER — Ambulatory Visit (INDEPENDENT_AMBULATORY_CARE_PROVIDER_SITE_OTHER): Payer: 59 | Admitting: Family Medicine

## 2014-10-05 VITALS — BP 153/96 | HR 80 | Temp 98.0°F | Ht 67.0 in | Wt 270.0 lb

## 2014-10-05 DIAGNOSIS — Z23 Encounter for immunization: Secondary | ICD-10-CM

## 2014-10-05 DIAGNOSIS — M25512 Pain in left shoulder: Secondary | ICD-10-CM

## 2014-10-05 NOTE — Progress Notes (Signed)
Pre visit review using our clinic review tool, if applicable. No additional management support is needed unless otherwise documented below in the visit note. 

## 2014-10-05 NOTE — Addendum Note (Signed)
Addended by: Aggie Hacker A on: 10/05/2014 10:43 AM   Modules accepted: Orders

## 2014-10-05 NOTE — Progress Notes (Signed)
   Subjective:    Patient ID: [name], female    DOB: 20-Jan-1961, 54 y.o.   MRN: [id number]  HPI Here for 2 months of progressive pain in the left shoulder which often radiates down the left arm. Sometimes her hand goes numb. No recent trauma. She is already on chronic pain meds of course but these do not help much.    Review of Systems  Constitutional: Negative.   Musculoskeletal: Positive for arthralgias.       Objective:   Physical Exam  Constitutional:  Splinting her left arm to her body, in pain  Musculoskeletal:  The left shoulder is tender all around, ROM is quite limited by pain. 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 We gave her an arm sling to support the arm and take stress off the shoulder. We will refer her back to Orthopedics at [facility].

## 2014-10-11 DIAGNOSIS — M7502 Adhesive capsulitis of left shoulder: Secondary | ICD-10-CM | POA: Insufficient documentation

## 2014-11-20 ENCOUNTER — Ambulatory Visit (HOSPITAL_COMMUNITY)
Admission: RE | Admit: 2014-11-20 | Discharge: 2014-11-20 | Disposition: A | Discharge status: Home / Self Care | Payer: 59 | Source: Ambulatory Visit | Attending: Anesthesiology | Admitting: Anesthesiology

## 2014-11-20 ENCOUNTER — Other Ambulatory Visit (HOSPITAL_COMMUNITY): Payer: Self-pay | Admitting: Anesthesiology

## 2014-11-20 DIAGNOSIS — M25572 Pain in left ankle and joints of left foot: Secondary | ICD-10-CM | POA: Diagnosis not present

## 2014-11-20 DIAGNOSIS — M7989 Other specified soft tissue disorders: Secondary | ICD-10-CM | POA: Diagnosis not present

## 2014-12-10 ENCOUNTER — Encounter (HOSPITAL_COMMUNITY): Payer: Self-pay

## 2014-12-10 ENCOUNTER — Emergency Department (HOSPITAL_COMMUNITY)
Admission: EM | Admit: 2014-12-10 | Discharge: 2014-12-10 | Disposition: A | Discharge status: Home / Self Care | Payer: 59 | Attending: Emergency Medicine | Admitting: Emergency Medicine

## 2014-12-10 DIAGNOSIS — Z91040 Latex allergy status: Secondary | ICD-10-CM | POA: Diagnosis not present

## 2014-12-10 DIAGNOSIS — Z720 Tobacco use: Secondary | ICD-10-CM | POA: Diagnosis not present

## 2014-12-10 DIAGNOSIS — R300 Dysuria: Secondary | ICD-10-CM | POA: Diagnosis present

## 2014-12-10 DIAGNOSIS — Z79899 Other long term (current) drug therapy: Secondary | ICD-10-CM | POA: Insufficient documentation

## 2014-12-10 DIAGNOSIS — N390 Urinary tract infection, site not specified: Secondary | ICD-10-CM

## 2014-12-10 DIAGNOSIS — Z86010 Personal history of colonic polyps: Secondary | ICD-10-CM | POA: Insufficient documentation

## 2014-12-10 DIAGNOSIS — Z8739 Personal history of other diseases of the musculoskeletal system and connective tissue: Secondary | ICD-10-CM | POA: Insufficient documentation

## 2014-12-10 DIAGNOSIS — K219 Gastro-esophageal reflux disease without esophagitis: Secondary | ICD-10-CM | POA: Insufficient documentation

## 2014-12-10 DIAGNOSIS — I10 Essential (primary) hypertension: Secondary | ICD-10-CM | POA: Diagnosis not present

## 2014-12-10 LAB — URINALYSIS, ROUTINE W REFLEX MICROSCOPIC
Bilirubin Urine: NEGATIVE
Glucose, UA: NEGATIVE mg/dL
Ketones, ur: NEGATIVE mg/dL
Nitrite: NEGATIVE
Protein, ur: NEGATIVE mg/dL
Specific Gravity, Urine: 1.022 (ref 1.005–1.030)
Urobilinogen, UA: 0.2 mg/dL (ref 0.0–1.0)
pH: 6 (ref 5.0–8.0)

## 2014-12-10 LAB — URINE MICROSCOPIC-ADD ON

## 2014-12-10 MED ORDER — CEPHALEXIN 500 MG PO CAPS
500.0000 mg | ORAL_CAPSULE | Freq: Once | ORAL | Status: AC
  Administered 2014-12-10: 500 mg via ORAL
  Filled 2014-12-10: qty 1

## 2014-12-10 MED ORDER — CEPHALEXIN 500 MG PO CAPS: 500.0000 mg | ORAL_CAPSULE | Freq: Three times a day (TID) | ORAL | Status: DC

## 2014-12-10 MED ORDER — PHENAZOPYRIDINE HCL 200 MG PO TABS: 200.0000 mg | ORAL_TABLET | Freq: Three times a day (TID) | ORAL | Status: DC | PRN

## 2014-12-10 MED ORDER — PHENAZOPYRIDINE HCL 200 MG PO TABS: 200.0000 mg | ORAL_TABLET | Freq: Three times a day (TID) | ORAL | Status: DC

## 2014-12-10 MED ORDER — OXYCODONE HCL 5 MG PO TABS
5.0000 mg | ORAL_TABLET | Freq: Once | ORAL | Status: AC
  Administered 2014-12-10: 5 mg via ORAL
  Filled 2014-12-10: qty 1

## 2014-12-10 NOTE — ED Notes (Signed)
Patient c/o dysuria since yesterday and states she had blood on the toilet tissue as well. Patient states she has a history of kidney and bladder infections.

## 2014-12-10 NOTE — ED Notes (Signed)
Questions, concerns denied. Pt ambulatory and a&ox4 upon dc

## 2014-12-10 NOTE — ED Provider Notes (Signed)
CSN: [id number]     Arrival date & time 12/10/14  1747 History   First MD Initiated Contact with Patient 12/10/14 1858     Chief Complaint  Patient presents with  . Dysuria     (Consider location/radiation/quality/duration/timing/severity/associated sxs/prior Treatment) Patient is a 54 y.o. female presenting with dysuria. The history is provided by the patient and medical records.  Dysuria  This is a 54 year old female with past medical history significant for osteoarthritis, iron headaches, hypertension, DDD, presenting to the ED for dysuria. She states after her hysterectomy several years ago her bladder "dropped" and she has been having issues with intermittent UTI's since this time.  States she elected not to have surgery for repair of this.  States yesterday she developed dysuria, worse at the end of her urinary stream.  She has also been urinating more frequently.  She denies and hematuria, flank pain, fever, chills, sweats, or vaginal discharge.  Denies any abdominal pain, states just some pressure in her suprapubic region which is normal during UTI.  VSS.  Past Medical History  Diagnosis Date  . Osteoarthritis   . Headache(784.0)     migraine like per patient  . Hypertension   . Low back pain     dr phillips, pain management  . Degenerative joint disease   . Gallstones   . GERD (gastroesophageal reflux disease)   . DDD (degenerative disc disease), cervical   . Colon polyps   . Diverticulosis   . Renal disorder    Past Surgical History  Procedure Laterality Date  . Replacement total knee Right     right - dr Novella Olive  . Ganglion cyst excision Right     right wrist  . Lipoma excision Right 02/21/08    [facility] hip, [facility]  . Replacement total knee Left 06/25/09    left - dr Marciano Sequin [facility]  . Tonsillectomy    . Joint replacement      both knees have been done, four times each per patient. Her body rejects the implant, no infection.  . Abdominal hysterectomy  06/22/06    lap,  dr Mancel Bale  . Other surgical history      lipoma removed from back 2012   . Cholecystectomy  10/28/2011    Procedure: LAPAROSCOPIC CHOLECYSTECTOMY WITH INTRAOPERATIVE CHOLANGIOGRAM;  Surgeon: Earnstine Regal, MD;  Location: [facility];  Service: General;  Laterality: N/A;  . Mass excision  08/24/2012    Procedure: EXCISION MASS;  Surgeon: Earnstine Regal, MD;  Location: [place];  Service: General;  Laterality: Left;  excisie soft tissue masses left shoulder(back) & left upper arm  . Colonoscopy  08-20-10    per Dr. Henrene Pastor, polyps, repeat in 3 yrs   . Mass excision Left 01/24/2014    Procedure: EXCISION SOFT TISSUE MASSES LOWER LEFT BACK;  Surgeon: Earnstine Regal, MD;  Location: [place];  Service: General;  Laterality: Left;   Family History  Problem Relation Age of Onset  . Crohn's disease Other     nephew  . Stomach cancer Mother   . Heart disease Father   . Kidney cancer Sister     one out of the four  . Hypertension Sister   . Hypertension Brother   . Colon cancer Maternal Grandmother    History  Substance Use Topics  . Smoking status: Current Every Day Smoker -- 0.25 packs/day for 20 years    Types: Cigarettes  . Smokeless tobacco: Never Used  Comment:  3 cigarettes per day  . Alcohol Use: No   OB History    No data available     Review of Systems  Genitourinary: Positive for dysuria and frequency.  All other systems reviewed and are negative.     Allergies  Codeine; Sulfonamide derivatives; and Latex  Home Medications   Prior to Admission medications   Medication Sig Start Date End Date Taking? Authorizing Provider  amitriptyline (ELAVIL) 25 MG tablet Take 25 mg by mouth at bedtime.    Yes Historical Provider, MD  amLODipine (NORVASC) 10 MG tablet Take 10 mg by mouth 2 (two) times daily.  07/21/12  Yes Laurey Morale, MD  furosemide (LASIX) 20 MG tablet TAKE 1 TABLET BY MOUTH EVERY DAY 08/14/14  Yes Laurey Morale, MD  hyoscyamine  (LEVSIN SL) 0.125 MG SL tablet Place 0.125 mg under the tongue every 4 (four) hours as needed for cramping (for stomach spasms). 02/03/13  Yes Irene Shipper, MD  methadone (DOLOPHINE) 10 MG tablet Take 10 mg by mouth every 8 (eight) hours.    Yes Historical Provider, MD  metoprolol (LOPRESSOR) 50 MG tablet TAKE 1 TABLET BY MOUTH TWICE DAILY 02/10/14  Yes Laurey Morale, MD  omeprazole (PRILOSEC) 40 MG capsule TAKE 1 CAPSULE BY MOUTH EVERY DAY   Yes Laurey Morale, MD  oxyCODONE (ROXICODONE) 15 MG immediate release tablet Take 15 mg by mouth every 4 (four) hours as needed.   Yes Historical Provider, MD  potassium chloride (K-DUR,KLOR-CON) 10 MEQ tablet Take 1 tablet (10 mEq total) by mouth 2 (two) times daily. 12/16/13  Yes Laurey Morale, MD  temazepam (RESTORIL) 30 MG capsule TAKE ONE CAPSULE BY MOUTH EVERY NIGHT AT BEDTIME AS NEEDED 07/14/14  Yes Laurey Morale, MD  amoxicillin-clavulanate (AUGMENTIN) 875-125 MG per tablet Take 1 tablet by mouth 2 (two) times daily. Patient not taking: Reported on 08/15/2014 02/27/14   Eulas Post, MD  ciprofloxacin (CIPRO) 500 MG tablet Take 1 tablet (500 mg total) by mouth 2 (two) times daily. Patient not taking: Reported on 10/05/2014 08/15/14   Laurey Morale, MD  HYDROmorphone (DILAUDID) 2 MG tablet Take 1 tablet (2 mg total) by mouth every 4 (four) hours as needed for moderate pain. Patient not taking: Reported on 08/15/2014 01/24/14   Armandina Gemma, MD   BP 185/96 mmHg  Pulse 74  Temp(Src) 98.1 F (36.7 C) (Oral)  Resp 20  SpO2 97%   Physical Exam  Constitutional: She is oriented to person, place, and time. She appears well-developed and well-nourished. No distress.  HENT:  Head: Normocephalic and atraumatic.  Mouth/Throat: Oropharynx is clear and moist.  Eyes: Conjunctivae and EOM are normal. Pupils are equal, round, and reactive to light.  Neck: Normal range of motion. Neck supple.  Cardiovascular: Normal rate, regular rhythm and normal heart sounds.    Pulmonary/Chest: Effort normal and breath sounds normal. No respiratory distress. She has no wheezes.  Abdominal: Soft. Bowel sounds are normal. There is no tenderness. There is no guarding and no CVA tenderness.  Musculoskeletal: Normal range of motion. She exhibits no edema.  Neurological: She is alert and oriented to person, place, and time.  Skin: Skin is warm and dry. She is not diaphoretic.  Psychiatric: She has a normal mood and affect.  Nursing note and vitals reviewed.   ED Course  Procedures (including critical care time) Labs Review Labs Reviewed  URINALYSIS, ROUTINE W REFLEX MICROSCOPIC - Abnormal; Notable for the  following:    APPearance CLOUDY (*)    Hgb urine dipstick SMALL (*)    Leukocytes, UA MODERATE (*)    All other components within normal limits  URINE MICROSCOPIC-ADD ON - Abnormal; Notable for the following:    Squamous Epithelial / LPF FEW (*)    All other components within normal limits    Imaging Review No results found.   EKG Interpretation None      MDM   Final diagnoses:  UTI (lower urinary tract infection)  Dysuria   54 year old female here with dysuria. She does have history of recurrent UTI following her hysterectomy several years ago. On exam, patient is afebrile and nontoxic in appearance.  She endorses pressure in her suprapubic region but abdominal exam is benign.  U/a appears infectious-- moderate leuks, too many to count WBCs.  No vaginal discharge reported.  Per prior culture reports, various bacteria which were sensitive to keflex-- will start on this as well as pyridium to help with dysuria.  Patient to FU with PCP.  Discussed plan with patient, he/she acknowledged understanding and agreed with plan of care.  Return precautions given for new or worsening symptoms.  Larene Pickett, PA-C 12/10/14 [place], MD 12/12/14 [phone]

## 2014-12-10 NOTE — Discharge Instructions (Signed)
Take the prescribed medication as directed. °Follow-up with your primary care physician. °Return to the ED for new or worsening symptoms. ° °

## 2015-01-13 ENCOUNTER — Other Ambulatory Visit: Payer: Self-pay | Admitting: Family Medicine

## 2015-01-14 ENCOUNTER — Other Ambulatory Visit: Payer: Self-pay | Admitting: Family Medicine

## 2015-01-15 NOTE — Telephone Encounter (Signed)
Pls advise on Temazepam 30 mg.  Pt last seen 1.14.2016.  Rx last filled 10.23.2015 #30 with 5 rf.

## 2015-02-03 ENCOUNTER — Emergency Department (HOSPITAL_COMMUNITY)
Admission: EM | Admit: 2015-02-03 | Discharge: 2015-02-03 | Disposition: A | Discharge status: Home / Self Care | Payer: 59 | Attending: Emergency Medicine | Admitting: Emergency Medicine

## 2015-02-03 ENCOUNTER — Emergency Department (HOSPITAL_COMMUNITY): Payer: 59

## 2015-02-03 ENCOUNTER — Encounter (HOSPITAL_COMMUNITY): Payer: Self-pay | Admitting: Emergency Medicine

## 2015-02-03 DIAGNOSIS — G43909 Migraine, unspecified, not intractable, without status migrainosus: Secondary | ICD-10-CM | POA: Diagnosis not present

## 2015-02-03 DIAGNOSIS — Z720 Tobacco use: Secondary | ICD-10-CM | POA: Insufficient documentation

## 2015-02-03 DIAGNOSIS — Z91040 Latex allergy status: Secondary | ICD-10-CM | POA: Insufficient documentation

## 2015-02-03 DIAGNOSIS — R52 Pain, unspecified: Secondary | ICD-10-CM

## 2015-02-03 DIAGNOSIS — M549 Dorsalgia, unspecified: Secondary | ICD-10-CM | POA: Diagnosis not present

## 2015-02-03 DIAGNOSIS — Z79899 Other long term (current) drug therapy: Secondary | ICD-10-CM | POA: Diagnosis not present

## 2015-02-03 DIAGNOSIS — R1030 Lower abdominal pain, unspecified: Secondary | ICD-10-CM | POA: Diagnosis present

## 2015-02-03 DIAGNOSIS — G8929 Other chronic pain: Secondary | ICD-10-CM | POA: Diagnosis not present

## 2015-02-03 DIAGNOSIS — Z8742 Personal history of other diseases of the female genital tract: Secondary | ICD-10-CM | POA: Insufficient documentation

## 2015-02-03 DIAGNOSIS — M25552 Pain in left hip: Secondary | ICD-10-CM | POA: Insufficient documentation

## 2015-02-03 DIAGNOSIS — K219 Gastro-esophageal reflux disease without esophagitis: Secondary | ICD-10-CM | POA: Diagnosis not present

## 2015-02-03 DIAGNOSIS — Z792 Long term (current) use of antibiotics: Secondary | ICD-10-CM | POA: Insufficient documentation

## 2015-02-03 DIAGNOSIS — I10 Essential (primary) hypertension: Secondary | ICD-10-CM | POA: Diagnosis not present

## 2015-02-03 DIAGNOSIS — Z86010 Personal history of colonic polyps: Secondary | ICD-10-CM | POA: Diagnosis not present

## 2015-02-03 MED ORDER — MORPHINE SULFATE 4 MG/ML IJ SOLN
6.0000 mg | Freq: Once | INTRAMUSCULAR | Status: AC
Start: 2015-02-03 — End: 2015-02-03
  Administered 2015-02-03: 6 mg via INTRAMUSCULAR
  Filled 2015-02-03: qty 2

## 2015-02-03 MED ORDER — DIAZEPAM 5 MG PO TABS
5.0000 mg | ORAL_TABLET | Freq: Once | ORAL | Status: AC
  Administered 2015-02-03: 5 mg via ORAL
  Filled 2015-02-03: qty 1

## 2015-02-03 NOTE — ED Notes (Signed)
Pt c/o L groin pain radiating to L thigh onset last night when she attempted to stand up from seated position. Pt is a pain clinic pt, took methadone @ 1830 with no relief.

## 2015-02-03 NOTE — Discharge Instructions (Signed)
Read the information below.  You may return to the Emergency Department at any time for worsening condition or any new symptoms that concern you.  If you develop uncontrolled pain, weakness or numbness of the extremity, severe discoloration of the skin, or you are unable to walk or move your hip, return to the ER for a recheck.      Hip Pain Your hip is the joint between your upper legs and your lower pelvis. The bones, cartilage, tendons, and muscles of your hip joint perform a lot of work each day supporting your body weight and allowing you to move around. Hip pain can range from a minor ache to severe pain in one or both of your hips. Pain may be felt on the inside of the hip joint near the groin, or the outside near the buttocks and upper thigh. You may have swelling or stiffness as well.  HOME CARE INSTRUCTIONS   Take medicines only as directed by your health care provider.  Apply ice to the injured area:  Put ice in a plastic bag.  Place a towel between your skin and the bag.  Leave the ice on for 15-20 minutes at a time, 3-4 times a day.  Keep your leg raised (elevated) when possible to lessen swelling.  Avoid activities that cause pain.  Follow specific exercises as directed by your health care provider.  Sleep with a pillow between your legs on your most comfortable side.  Record how often you have hip pain, the location of the pain, and what it feels like. SEEK MEDICAL CARE IF:   You are unable to put weight on your leg.  Your hip is red or swollen or very tender to touch.  Your pain or swelling continues or worsens after 1 week.  You have increasing difficulty walking.  You have a fever. SEEK IMMEDIATE MEDICAL CARE IF:   You have fallen.  You have a sudden increase in pain and swelling in your hip. MAKE SURE YOU:   Understand these instructions.  Will watch your condition.  Will get help right away if you are not doing well or get worse. Document  Released: 02/26/2010 Document Revised: 01/23/2014 Document Reviewed: 05/05/2013 [facility] Patient Information 2015 [place], [place]. This information is not intended to replace advice given to you by your health care provider. Make sure you discuss any questions you have with your health care provider.

## 2015-02-03 NOTE — ED Notes (Signed)
Pt c/o left groin pain that started last night.

## 2015-02-03 NOTE — ED Provider Notes (Signed)
CSN: [id number]     Arrival date & time 02/03/15  1933 History   First MD Initiated Contact with Patient 02/03/15 1956     Chief Complaint  Patient presents with  . Groin Pain     (Consider location/radiation/quality/duration/timing/severity/associated sxs/prior Treatment) The history is provided by the patient.     Patient with hx DJD, DDD in pain management, presents with left hip and thigh pain that began last night when she stood up and started walking.  States that it initially felt like "a catch" and was aching, worsened today.  The pain is located in her left anterior hip and radiates down the front of her thigh.  Denies fevers, abdominal pain, change in chronic back pain, falls or recent injury, weakness or numbness of the legs.  Denies recent infections or illnesses.  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  Orthopedist is at [facility].    Past Medical History  Diagnosis Date  . Osteoarthritis   . Headache(784.0)     migraine like per patient  . Hypertension   . Low back pain     dr phillips, pain management  . Degenerative joint disease   . Gallstones   . GERD (gastroesophageal reflux disease)   . DDD (degenerative disc disease), cervical   . Colon polyps   . Diverticulosis   . Renal disorder    Past Surgical History  Procedure Laterality Date  . Replacement total knee Right     right - dr Novella Olive  . Ganglion cyst excision Right     right wrist  . Lipoma excision Right 02/21/08    right hip, [facility]  . Replacement total knee Left 06/25/09    left - dr Marciano Sequin [facility]  . Tonsillectomy    . Joint replacement      both knees have been done, four times each per patient. Her body rejects the implant, no infection.  . Abdominal hysterectomy  06/22/06    lap, dr Mancel Bale  . Other surgical history      lipoma removed from back 2012   . Cholecystectomy  10/28/2011    Procedure: LAPAROSCOPIC CHOLECYSTECTOMY WITH INTRAOPERATIVE CHOLANGIOGRAM;  Surgeon: Earnstine Regal, MD;   Location: [facility];  Service: General;  Laterality: N/A;  . Mass excision  08/24/2012    Procedure: EXCISION MASS;  Surgeon: Earnstine Regal, MD;  Location: [place];  Service: General;  Laterality: Left;  excisie soft tissue masses left shoulder(back) & left upper arm  . Colonoscopy  08-20-10    per Dr. Henrene Pastor, polyps, repeat in 3 yrs   . Mass excision Left 01/24/2014    Procedure: EXCISION SOFT TISSUE MASSES LOWER LEFT BACK;  Surgeon: Earnstine Regal, MD;  Location: [place];  Service: General;  Laterality: Left;   Family History  Problem Relation Age of Onset  . Crohn's disease Other     nephew  . Stomach cancer Mother   . Heart disease Father   . Kidney cancer Sister     one out of the four  . Hypertension Sister   . Hypertension Brother   . Colon cancer Maternal Grandmother    History  Substance Use Topics  . Smoking status: Current Every Day Smoker -- 0.25 packs/day for 20 years    Types: Cigarettes  . Smokeless tobacco: Never Used     Comment:  3 cigarettes per day  . Alcohol Use: No   OB History    No data available  Review of Systems  Constitutional: Negative for fever and chills.  HENT: Negative for congestion and sore throat.   Respiratory: Negative for cough and shortness of breath.   Cardiovascular: Negative for chest pain.  Gastrointestinal: Negative for nausea, vomiting, abdominal pain, diarrhea and constipation.  Genitourinary: Negative for dysuria, urgency and frequency.  Musculoskeletal: Positive for back pain and arthralgias.  Skin: Negative for color change.  Neurological: Negative for weakness and numbness.  Psychiatric/Behavioral: Negative for self-injury.      Allergies  Codeine; Sulfonamide derivatives; and Latex  Home Medications   Prior to Admission medications   Medication Sig Start Date End Date Taking? Authorizing Provider  amitriptyline (ELAVIL) 25 MG tablet Take 25 mg by mouth at bedtime.     Historical  Provider, MD  amLODipine (NORVASC) 10 MG tablet Take 10 mg by mouth 2 (two) times daily.  07/21/12   Laurey Morale, MD  amLODipine (NORVASC) 5 MG tablet TAKE 1 TABLET BY MOUTH TWICE DAILY 01/15/15   Laurey Morale, MD  amoxicillin-clavulanate (AUGMENTIN) 875-125 MG per tablet Take 1 tablet by mouth 2 (two) times daily. Patient not taking: Reported on 08/15/2014 02/27/14   Eulas Post, MD  cephALEXin (KEFLEX) 500 MG capsule Take 1 capsule (500 mg total) by mouth 3 (three) times daily. 12/10/14   Larene Pickett, PA-C  ciprofloxacin (CIPRO) 500 MG tablet Take 1 tablet (500 mg total) by mouth 2 (two) times daily. Patient not taking: Reported on 10/05/2014 08/15/14   Laurey Morale, MD  furosemide (LASIX) 20 MG tablet TAKE 1 TABLET BY MOUTH EVERY DAY 01/15/15   Laurey Morale, MD  HYDROmorphone (DILAUDID) 2 MG tablet Take 1 tablet (2 mg total) by mouth every 4 (four) hours as needed for moderate pain. Patient not taking: Reported on 08/15/2014 01/24/14   Armandina Gemma, MD  hyoscyamine (LEVSIN SL) 0.125 MG SL tablet Place 0.125 mg under the tongue every 4 (four) hours as needed for cramping (for stomach spasms). 02/03/13   Irene Shipper, MD  methadone (DOLOPHINE) 10 MG tablet Take 10 mg by mouth every 8 (eight) hours.     Historical Provider, MD  metoprolol (LOPRESSOR) 50 MG tablet TAKE 1 TABLET BY MOUTH TWICE DAILY 02/10/14   Laurey Morale, MD  omeprazole (PRILOSEC) 40 MG capsule TAKE 1 CAPSULE BY MOUTH EVERY DAY    Laurey Morale, MD  oxyCODONE (ROXICODONE) 15 MG immediate release tablet Take 15 mg by mouth every 4 (four) hours as needed.    Historical Provider, MD  phenazopyridine (PYRIDIUM) 200 MG tablet Take 1 tablet (200 mg total) by mouth 3 (three) times daily as needed for pain. 12/10/14   Larene Pickett, PA-C  potassium chloride (K-DUR,KLOR-CON) 10 MEQ tablet Take 1 tablet (10 mEq total) by mouth 2 (two) times daily. 12/16/13   Laurey Morale, MD  temazepam (RESTORIL) 30 MG capsule TAKE 1 CAPSULE BY MOUTH  EVERY NIGHT AT BEDTIME AS NEEDED 01/15/15   Laurey Morale, MD   BP 183/85 mmHg  Pulse 69  Temp(Src) 98.2 F (36.8 C) (Oral)  Resp 20  Ht 5\' 7"  (1.702 m)  Wt 270 lb (122.471 kg)  BMI 42.28 kg/m2  SpO2 98% Physical Exam  Constitutional: She appears well-developed and well-nourished. No distress.  HENT:  Head: Normocephalic and atraumatic.  Neck: Neck supple.  Pulmonary/Chest: Effort normal.  Abdominal: Soft. She exhibits no distension and no mass. There is no tenderness. There is no rebound and no guarding.  Musculoskeletal: She exhibits no edema.       Left hip: She exhibits decreased range of motion, decreased strength, tenderness and bony tenderness. She exhibits no swelling, no crepitus and no deformity.  Left hip without erythema, edema, warmth.  Diffuse tenderness throughout lateral and anterior left hip, left anterior thigh.    Neurological: She is alert.  Skin: She is not diaphoretic.  Psychiatric: She has a normal mood and affect. Her behavior is normal.  Nursing note and vitals reviewed.   ED Course  Procedures (including critical care time) Labs Review Labs Reviewed - No data to display  Imaging Review Dg Hip Unilat With Pelvis 2-3 Views Left  02/03/2015   CLINICAL DATA:  Left groin pain radiating to the left thigh. Onset last night upon standing from seated position.  EXAM: LEFT HIP (WITH PELVIS) 2-3 VIEWS  COMPARISON:  09/23/2013  FINDINGS: Mild degenerative changes in the hips with narrowing and sclerosis of the superior acetabular joint. Minimal osteophytosis. Pelvis and hips are otherwise intact. No acute fracture or dislocation identified. SI joints and symphysis pubis are not displaced.  IMPRESSION: Mild degenerative changes in the left hip. No acute fractures identified.   Electronically Signed   By: Lucienne Capers M.D.   On: 02/03/2015 21:11     EKG Interpretation None      MDM   Final diagnoses:  Pain  Left hip pain   Afebrile, nontoxic patient with  pain in left hip that began while getting to a standing position.   Xray negative for acute change.  Neurovascularly intact.  No e/o septic joint.  Suspect isolated muscle cramp/spasm.   D/C home with orthopedic/pain management follow up.  Discussed result, findings, treatment, and follow up  with patient.  Pt given return precautions.  Pt verbalizes understanding and agrees with plan.        Clayton Bibles, PA-C 02/03/15 [id number]  Everlene Balls, MD 02/04/15 [id number]

## 2015-02-10 ENCOUNTER — Other Ambulatory Visit: Payer: Self-pay | Admitting: Family Medicine

## 2015-02-12 ENCOUNTER — Telehealth: Payer: Self-pay | Admitting: Family Medicine

## 2015-02-12 NOTE — Telephone Encounter (Signed)
Sent to the pharmacy for 90 days.  Pt now due for her physical and lab work.  Will send a message to scheduling.

## 2015-02-12 NOTE — Telephone Encounter (Signed)
Done.  Please see below

## 2015-02-12 NOTE — Telephone Encounter (Signed)
Pt now due for CPX and lab work.  Please help her to make both appointments.  Send message back to [employer], Therapist, sports.  I am uncertain what lab work Dr. Sarajane Jews orders for his physical labs.  Thanks! Filled omeprazole for 90 days.

## 2015-02-13 ENCOUNTER — Other Ambulatory Visit: Payer: Self-pay | Admitting: Family Medicine

## 2015-02-13 DIAGNOSIS — Z0000 Encounter for general adult medical examination without abnormal findings: Secondary | ICD-10-CM

## 2015-02-13 NOTE — Telephone Encounter (Signed)
CPE labs need to be ordered in computer.

## 2015-02-13 NOTE — Telephone Encounter (Signed)
Per Dr. Sarajane Jews okay to order labs and I did put a future order in computer.

## 2015-02-21 ENCOUNTER — Other Ambulatory Visit: Payer: Self-pay | Admitting: Family Medicine

## 2015-02-21 NOTE — Telephone Encounter (Signed)
Looks like pt hasn't had this script filled since 2015, should she be taking this medication?

## 2015-02-26 ENCOUNTER — Other Ambulatory Visit (INDEPENDENT_AMBULATORY_CARE_PROVIDER_SITE_OTHER): Payer: 59

## 2015-02-26 DIAGNOSIS — Z0000 Encounter for general adult medical examination without abnormal findings: Secondary | ICD-10-CM

## 2015-02-26 DIAGNOSIS — R7989 Other specified abnormal findings of blood chemistry: Secondary | ICD-10-CM | POA: Diagnosis not present

## 2015-02-26 LAB — CBC WITH DIFFERENTIAL/PLATELET
Basophils Absolute: 0 10*3/uL (ref 0.0–0.1)
Basophils Relative: 0.3 % (ref 0.0–3.0)
Eosinophils Absolute: 0.1 10*3/uL (ref 0.0–0.7)
Eosinophils Relative: 1.6 % (ref 0.0–5.0)
HCT: 41.7 % (ref 36.0–46.0)
Hemoglobin: 14 g/dL (ref 12.0–15.0)
Lymphocytes Relative: 33.8 % (ref 12.0–46.0)
Lymphs Abs: 2.6 10*3/uL (ref 0.7–4.0)
MCHC: 33.6 g/dL (ref 30.0–36.0)
MCV: 87.4 fl (ref 78.0–100.0)
Monocytes Absolute: 0.3 10*3/uL (ref 0.1–1.0)
Monocytes Relative: 4 % (ref 3.0–12.0)
Neutro Abs: 4.6 10*3/uL (ref 1.4–7.7)
Neutrophils Relative %: 60.3 % (ref 43.0–77.0)
Platelets: 318 10*3/uL (ref 150.0–400.0)
RBC: 4.76 Mil/uL (ref 3.87–5.11)
RDW: 14.4 % (ref 11.5–15.5)
WBC: 7.7 10*3/uL (ref 4.0–10.5)

## 2015-02-26 LAB — POCT URINALYSIS DIPSTICK
Bilirubin, UA: NEGATIVE
Glucose, UA: NEGATIVE
Ketones, UA: NEGATIVE
Leukocytes, UA: NEGATIVE
Nitrite, UA: NEGATIVE
Protein, UA: NEGATIVE
Spec Grav, UA: 1.015
Urobilinogen, UA: 0.2
pH, UA: 7

## 2015-02-26 LAB — HEPATIC FUNCTION PANEL
ALT: 17 U/L (ref 0–35)
AST: 22 U/L (ref 0–37)
Albumin: 4.4 g/dL (ref 3.5–5.2)
Alkaline Phosphatase: 96 U/L (ref 39–117)
Bilirubin, Direct: 0.1 mg/dL (ref 0.0–0.3)
Total Bilirubin: 0.4 mg/dL (ref 0.2–1.2)
Total Protein: 7.2 g/dL (ref 6.0–8.3)

## 2015-02-26 LAB — LIPID PANEL
Cholesterol: 249 mg/dL — ABNORMAL HIGH (ref 0–200)
HDL: 39.8 mg/dL (ref >39.00)
NonHDL: 209.2
Total CHOL/HDL Ratio: 6
Triglycerides: 219 mg/dL — ABNORMAL HIGH (ref 0.0–149.0)
VLDL: 43.8 mg/dL — ABNORMAL HIGH (ref 0.0–40.0)

## 2015-02-26 LAB — TSH: TSH: 2.55 u[IU]/mL (ref 0.35–4.50)

## 2015-02-26 LAB — BASIC METABOLIC PANEL
BUN: 12 mg/dL (ref 6–23)
CO2: 30 mEq/L (ref 19–32)
Calcium: 9.5 mg/dL (ref 8.4–10.5)
Chloride: 101 mEq/L (ref 96–112)
Creatinine, Ser: 0.79 mg/dL (ref 0.40–1.20)
GFR: 97.07 mL/min (ref >60.00)
Glucose, Bld: 166 mg/dL — ABNORMAL HIGH (ref 70–99)
Potassium: 3 mEq/L — ABNORMAL LOW (ref 3.5–5.1)
Sodium: 138 mEq/L (ref 135–145)

## 2015-02-26 LAB — LDL CHOLESTEROL, DIRECT: Direct LDL: 158 mg/dL

## 2015-03-05 ENCOUNTER — Ambulatory Visit (INDEPENDENT_AMBULATORY_CARE_PROVIDER_SITE_OTHER): Payer: 59 | Admitting: Family Medicine

## 2015-03-05 ENCOUNTER — Encounter: Payer: Self-pay | Admitting: Family Medicine

## 2015-03-05 VITALS — BP 136/79 | HR 70 | Temp 98.2°F | Ht 67.0 in | Wt 274.0 lb

## 2015-03-05 DIAGNOSIS — I10 Essential (primary) hypertension: Secondary | ICD-10-CM

## 2015-03-05 DIAGNOSIS — Z0000 Encounter for general adult medical examination without abnormal findings: Secondary | ICD-10-CM

## 2015-03-05 DIAGNOSIS — E119 Type 2 diabetes mellitus without complications: Secondary | ICD-10-CM | POA: Diagnosis not present

## 2015-03-05 MED ORDER — OMEPRAZOLE 40 MG PO CPDR: 40.0000 mg | DELAYED_RELEASE_CAPSULE | Freq: Every day | ORAL | Status: DC

## 2015-03-05 MED ORDER — POTASSIUM CHLORIDE CRYS ER 10 MEQ PO TBCR: 20.0000 meq | EXTENDED_RELEASE_TABLET | Freq: Two times a day (BID) | ORAL | Status: DC

## 2015-03-05 MED ORDER — AMLODIPINE BESYLATE 5 MG PO TABS: 5.0000 mg | ORAL_TABLET | Freq: Two times a day (BID) | ORAL | Status: DC

## 2015-03-05 MED ORDER — TEMAZEPAM 30 MG PO CAPS: 30.0000 mg | ORAL_CAPSULE | Freq: Every evening | ORAL | Status: DC | PRN

## 2015-03-05 NOTE — Progress Notes (Signed)
Pre visit review using our clinic review tool, if applicable. No additional management support is needed unless otherwise documented below in the visit note. 

## 2015-03-05 NOTE — Progress Notes (Signed)
   Subjective:    Patient ID: [name], female    DOB: 28-Jun-1961, 54 y.o.   MRN: [id number]  HPI 54 yr old female for a cpx. She feels well except for her chronic arthritis pains. She walks with a cane. It is difficult for her to get any sort of exercise. Of course the big news is she has been diagnosed with diabetes, based on a fasting glucose of 166 on her recent labs.    Review of Systems  Constitutional: Negative.   HENT: Negative.   Eyes: Negative.   Respiratory: Negative.   Cardiovascular: Negative.   Gastrointestinal: Negative.   Genitourinary: Negative for dysuria, urgency, frequency, hematuria, flank pain, decreased urine volume, enuresis, difficulty urinating, pelvic pain and dyspareunia.  Musculoskeletal: Negative.   Skin: Negative.   Neurological: Negative.   Psychiatric/Behavioral: Negative.        Objective:   Physical Exam  Constitutional: She is oriented to person, place, and time. She appears well-developed and well-nourished. No distress.  HENT:  Head: Normocephalic and atraumatic.  Right Ear: External ear normal.  Left Ear: External ear normal.  Nose: Nose normal.  Mouth/Throat: Oropharynx is clear and moist. No oropharyngeal exudate.  Eyes: Conjunctivae and EOM are normal. Pupils are equal, round, and reactive to light. No scleral icterus.  Neck: Normal range of motion. Neck supple. No JVD present. No thyromegaly present.  Cardiovascular: Normal rate, regular rhythm, normal heart sounds and intact distal pulses.  Exam reveals no gallop and no friction rub.   No murmur heard. Pulmonary/Chest: Effort normal and breath sounds normal. No respiratory distress. She has no wheezes. She has no rales. She exhibits no tenderness.  Abdominal: Soft. Bowel sounds are normal. She exhibits no distension and no mass. There is no tenderness. There is no rebound and no guarding.  Musculoskeletal: Normal range of motion. She exhibits no edema or tenderness.    Lymphadenopathy:    She has no cervical adenopathy.  Neurological: She is alert and oriented to person, place, and time. She has normal reflexes. No cranial nerve deficit. She exhibits normal muscle tone. Coordination normal.  Skin: Skin is warm and dry. No rash noted. No erythema.  Psychiatric: She has a normal mood and affect. Her behavior is normal. Judgment and thought content normal.          Assessment & Plan:  Well exam. We discussed forms of exercise she could try like swimming or water aerobics. As for the diabetes, we will refer her to Nutrition for education. I wrote for her to get a glucometer to start checking glucoses at home. We will get a baseline A1c today and based on that result we may likely start her on Metformin. Recheck in 2 weeks.

## 2015-03-06 LAB — MICROALBUMIN / CREATININE URINE RATIO
Creatinine,U: 254 mg/dL
Microalb Creat Ratio: 0.9 mg/g (ref 0.0–30.0)
Microalb, Ur: 2.3 mg/dL — ABNORMAL HIGH (ref 0.0–1.9)

## 2015-03-06 LAB — HEMOGLOBIN A1C: Hgb A1c MFr Bld: 7.2 % — ABNORMAL HIGH (ref 4.6–6.5)

## 2015-03-12 ENCOUNTER — Other Ambulatory Visit: Payer: Self-pay | Admitting: Family Medicine

## 2015-03-12 DIAGNOSIS — E119 Type 2 diabetes mellitus without complications: Secondary | ICD-10-CM

## 2015-03-12 MED ORDER — METFORMIN HCL 500 MG PO TABS: 500.0000 mg | ORAL_TABLET | Freq: Two times a day (BID) | ORAL | Status: DC

## 2015-03-12 MED ORDER — LISINOPRIL 10 MG PO TABS: 10.0000 mg | ORAL_TABLET | Freq: Every day | ORAL | Status: DC

## 2015-03-19 ENCOUNTER — Other Ambulatory Visit: Payer: Self-pay

## 2015-03-29 ENCOUNTER — Encounter: Payer: 59 | Attending: Family Medicine

## 2015-03-29 VITALS — Ht 67.0 in | Wt 264.7 lb

## 2015-03-29 DIAGNOSIS — Z713 Dietary counseling and surveillance: Secondary | ICD-10-CM | POA: Diagnosis not present

## 2015-03-29 DIAGNOSIS — E119 Type 2 diabetes mellitus without complications: Secondary | ICD-10-CM | POA: Insufficient documentation

## 2015-03-29 NOTE — Progress Notes (Signed)

## 2015-04-05 ENCOUNTER — Ambulatory Visit: Payer: 59

## 2015-04-12 ENCOUNTER — Ambulatory Visit: Payer: 59

## 2015-05-18 ENCOUNTER — Encounter: Payer: Self-pay | Admitting: Family Medicine

## 2015-05-18 ENCOUNTER — Ambulatory Visit (INDEPENDENT_AMBULATORY_CARE_PROVIDER_SITE_OTHER): Payer: 59 | Admitting: Family Medicine

## 2015-05-18 VITALS — BP 174/100 | HR 79 | Temp 98.4°F | Ht 67.0 in | Wt 256.0 lb

## 2015-05-18 DIAGNOSIS — S76219A Strain of adductor muscle, fascia and tendon of unspecified thigh, initial encounter: Secondary | ICD-10-CM

## 2015-05-18 DIAGNOSIS — S39011A Strain of muscle, fascia and tendon of abdomen, initial encounter: Secondary | ICD-10-CM | POA: Diagnosis not present

## 2015-05-18 NOTE — Progress Notes (Signed)
Pre visit review using our clinic review tool, if applicable. No additional management support is needed unless otherwise documented below in the visit note. 

## 2015-05-18 NOTE — Progress Notes (Signed)
   Subjective:    Patient ID: [name], female    DOB: 19-Sep-1961, 54 y.o.   MRN: [id number]  HPI Here for one week of sharp pains in the left groin. No recent trauma but she has been walking much more than usual to get exercise. She has lost 20 lbs of weight. She is on her usual pain meds.    Review of Systems  Constitutional: Negative.   Musculoskeletal: Positive for back pain, arthralgias and gait problem.       Objective:   Physical Exam  Constitutional: She appears well-developed and well-nourished.  Musculoskeletal:  She is tender along the left inguinal area. No masses or hernias          Assessment & Plan:  Groin strain. Rest and use ice packs. Recheck prn

## 2015-06-05 ENCOUNTER — Encounter (HOSPITAL_COMMUNITY): Payer: Self-pay | Admitting: Emergency Medicine

## 2015-06-05 ENCOUNTER — Emergency Department (HOSPITAL_COMMUNITY)
Admission: EM | Admit: 2015-06-05 | Discharge: 2015-06-06 | Disposition: A | Discharge status: Home / Self Care | Payer: 59 | Attending: Emergency Medicine | Admitting: Emergency Medicine

## 2015-06-05 ENCOUNTER — Emergency Department (HOSPITAL_COMMUNITY): Payer: 59

## 2015-06-05 DIAGNOSIS — E119 Type 2 diabetes mellitus without complications: Secondary | ICD-10-CM | POA: Insufficient documentation

## 2015-06-05 DIAGNOSIS — R079 Chest pain, unspecified: Secondary | ICD-10-CM

## 2015-06-05 DIAGNOSIS — Z86010 Personal history of colonic polyps: Secondary | ICD-10-CM | POA: Diagnosis not present

## 2015-06-05 DIAGNOSIS — R197 Diarrhea, unspecified: Secondary | ICD-10-CM | POA: Diagnosis not present

## 2015-06-05 DIAGNOSIS — E876 Hypokalemia: Secondary | ICD-10-CM | POA: Diagnosis not present

## 2015-06-05 DIAGNOSIS — R202 Paresthesia of skin: Secondary | ICD-10-CM | POA: Insufficient documentation

## 2015-06-05 DIAGNOSIS — Z79899 Other long term (current) drug therapy: Secondary | ICD-10-CM | POA: Insufficient documentation

## 2015-06-05 DIAGNOSIS — Z8739 Personal history of other diseases of the musculoskeletal system and connective tissue: Secondary | ICD-10-CM | POA: Insufficient documentation

## 2015-06-05 DIAGNOSIS — K219 Gastro-esophageal reflux disease without esophagitis: Secondary | ICD-10-CM | POA: Insufficient documentation

## 2015-06-05 DIAGNOSIS — Z91040 Latex allergy status: Secondary | ICD-10-CM | POA: Diagnosis not present

## 2015-06-05 DIAGNOSIS — I10 Essential (primary) hypertension: Secondary | ICD-10-CM | POA: Insufficient documentation

## 2015-06-05 DIAGNOSIS — R200 Anesthesia of skin: Secondary | ICD-10-CM | POA: Diagnosis present

## 2015-06-05 DIAGNOSIS — Z87448 Personal history of other diseases of urinary system: Secondary | ICD-10-CM | POA: Insufficient documentation

## 2015-06-05 DIAGNOSIS — Z720 Tobacco use: Secondary | ICD-10-CM | POA: Insufficient documentation

## 2015-06-05 LAB — COMPREHENSIVE METABOLIC PANEL
ALT: 14 U/L (ref 14–54)
AST: 24 U/L (ref 15–41)
Albumin: 4.8 g/dL (ref 3.5–5.0)
Alkaline Phosphatase: 69 U/L (ref 38–126)
Anion gap: 10 (ref 5–15)
BUN: 13 mg/dL (ref 6–20)
CO2: 26 mmol/L (ref 22–32)
Calcium: 9 mg/dL (ref 8.9–10.3)
Chloride: 104 mmol/L (ref 101–111)
Creatinine, Ser: 0.91 mg/dL (ref 0.44–1.00)
GFR calc Af Amer: >60 mL/min (ref >60)
GFR calc non Af Amer: >60 mL/min (ref >60)
Glucose, Bld: 104 mg/dL — ABNORMAL HIGH (ref 65–99)
Potassium: 2.9 mmol/L — ABNORMAL LOW (ref 3.5–5.1)
Sodium: 140 mmol/L (ref 135–145)
Total Bilirubin: 0.5 mg/dL (ref 0.3–1.2)
Total Protein: 7.7 g/dL (ref 6.5–8.1)

## 2015-06-05 LAB — I-STAT TROPONIN, ED: Troponin i, poc: 0.02 ng/mL (ref 0.00–0.08)

## 2015-06-05 LAB — CBC
HCT: 40.7 % (ref 36.0–46.0)
Hemoglobin: 13.8 g/dL (ref 12.0–15.0)
MCH: 29.8 pg (ref 26.0–34.0)
MCHC: 33.9 g/dL (ref 30.0–36.0)
MCV: 87.9 fL (ref 78.0–100.0)
Platelets: 287 10*3/uL (ref 150–400)
RBC: 4.63 MIL/uL (ref 3.87–5.11)
RDW: 13.8 % (ref 11.5–15.5)
WBC: 9.3 10*3/uL (ref 4.0–10.5)

## 2015-06-05 LAB — LIPASE, BLOOD: Lipase: 28 U/L (ref 22–51)

## 2015-06-05 MED ORDER — SODIUM CHLORIDE 0.9 % IV BOLUS (SEPSIS)
1000.0000 mL | Freq: Once | INTRAVENOUS | Status: AC
  Administered 2015-06-05: 1000 mL via INTRAVENOUS

## 2015-06-05 MED ORDER — ONDANSETRON HCL 4 MG/2ML IJ SOLN
4.0000 mg | Freq: Once | INTRAMUSCULAR | Status: AC
  Administered 2015-06-05: 4 mg via INTRAVENOUS
  Filled 2015-06-05: qty 2

## 2015-06-05 MED ORDER — MORPHINE SULFATE (PF) 4 MG/ML IV SOLN
4.0000 mg | Freq: Once | INTRAVENOUS | Status: AC
  Administered 2015-06-05: 4 mg via INTRAVENOUS
  Filled 2015-06-05: qty 1

## 2015-06-05 MED ORDER — POTASSIUM CHLORIDE CRYS ER 20 MEQ PO TBCR
40.0000 meq | EXTENDED_RELEASE_TABLET | Freq: Once | ORAL | Status: AC
  Administered 2015-06-05: 40 meq via ORAL
  Filled 2015-06-05: qty 2

## 2015-06-05 NOTE — ED Notes (Signed)
Pt c/o abd cramping, diarrhea and nausea.  Pt also states that she has numbness in lips and hands.  Pt is diaphoretic in triage room.

## 2015-06-05 NOTE — ED Provider Notes (Signed)
CSN: [id number]     Arrival date & time 06/05/15  1807 History   First MD Initiated Contact with Patient 06/05/15 2212     Chief Complaint  Patient presents with  . Numbness  . Abdominal Cramping  . Diarrhea     (Consider location/radiation/quality/duration/timing/severity/associated sxs/prior Treatment) Patient is a 54 y.o. female presenting with cramps and diarrhea. The history is provided by the patient. No language interpreter was used.  Abdominal Cramping Associated symptoms include chest pain and numbness. Pertinent negatives include no abdominal pain, chills or fever.  Diarrhea Associated symptoms: no abdominal pain, no chills and no fever   Ms. Phariss is a 54 y.o female with a history of hypertension, low back pain, and diabetes who presents for numbness and tingling in her hands, numbness of her lips, abdominal cramping, diarrhea, and nausea that began yesterday.  She began having chest pain at night which resolved and return while she was waiting in the lobby. She admits to having these symptoms previously when her potassium was low. She says that her metoprolol was increased from 50 mg twice a day to 100 mg twice a day last week. She denies being on any anticoagulation medication or aspirin. She states aspirin causes epigastric discomfort. She denies any fever, chills, shortness of breath, cough, nausea, vomiting, dysuria, hematuria, urinary frequency. She smokes half a pack a day. She states that she has a strong family cardiac history including father, brother and sister. Past Medical History  Diagnosis Date  . Osteoarthritis   . Headache(784.0)     migraine like per patient  . Hypertension   . Low back pain     dr phillips, pain management  . Degenerative joint disease   . Gallstones   . GERD (gastroesophageal reflux disease)   . DDD (degenerative disc disease), cervical   . Colon polyps   . Diverticulosis   . Renal disorder   . Diabetes mellitus without complication     Past Surgical History  Procedure Laterality Date  . Replacement total knee Right     right - dr Novella Olive  . Ganglion cyst excision Right     right wrist  . Lipoma excision Right 02/21/08    right hip, [facility]  . Replacement total knee Left 06/25/09    left - dr Marciano Sequin [facility]  . Tonsillectomy    . Joint replacement      both knees have been done, four times each per patient. Her body rejects the implant, no infection.  . Abdominal hysterectomy  06/22/06    lap, dr Mancel Bale  . Other surgical history      lipoma removed from back 2012   . Cholecystectomy  10/28/2011    Procedure: LAPAROSCOPIC CHOLECYSTECTOMY WITH INTRAOPERATIVE CHOLANGIOGRAM;  Surgeon: Earnstine Regal, MD;  Location: [facility];  Service: General;  Laterality: N/A;  . Mass excision  08/24/2012    Procedure: EXCISION MASS;  Surgeon: Earnstine Regal, MD;  Location: [facility];  Service: General;  Laterality: Left;  excisie soft tissue masses left shoulder(back) & left upper arm  . Colonoscopy  02-03-13    per Dr. Henrene Pastor, diverticuli but no polyps, repeat in 5 yrs   . Mass excision Left 01/24/2014    Procedure: EXCISION SOFT TISSUE MASSES LOWER LEFT BACK;  Surgeon: Earnstine Regal, MD;  Location: [facility];  Service: General;  Laterality: Left;  . Esophagogastroduodenoscopy  02-03-13    per Dr. Henrene Pastor, normal    Family History  Problem Relation Age of Onset  . Crohn's disease Other     nephew  . Stomach cancer Mother   . Heart disease Father   . Kidney cancer Sister     one out of the four  . Hypertension Sister   . Hypertension Brother   . Colon cancer Maternal Grandmother    Social History  Substance Use Topics  . Smoking status: Current Every Day Smoker -- 20 years    Types: Cigarettes  . Smokeless tobacco: Never Used     Comment: trying to quit, 2 per day  . Alcohol Use: No   OB History    No data available     Review of Systems  Constitutional: Negative for fever and chills.   Respiratory: Negative for shortness of breath.   Cardiovascular: Positive for chest pain.  Gastrointestinal: Positive for diarrhea. Negative for abdominal pain.  Neurological: Positive for numbness.  All other systems reviewed and are negative.     Allergies  Codeine; Sulfonamide derivatives; and Latex  Home Medications   Prior to Admission medications   Medication Sig Start Date End Date Taking? Authorizing Provider  amitriptyline (ELAVIL) 25 MG tablet Take 25 mg by mouth at bedtime.    Yes Historical Provider, MD  amLODipine (NORVASC) 5 MG tablet Take 1 tablet (5 mg total) by mouth 2 (two) times daily. 03/05/15  Yes Laurey Morale, MD  furosemide (LASIX) 20 MG tablet TAKE 1 TABLET BY MOUTH EVERY DAY 01/15/15  Yes Laurey Morale, MD  lisinopril (PRINIVIL,ZESTRIL) 10 MG tablet Take 1 tablet (10 mg total) by mouth daily. 03/12/15  Yes Laurey Morale, MD  metFORMIN (GLUCOPHAGE) 500 MG tablet Take 1 tablet (500 mg total) by mouth 2 (two) times daily with a meal. 03/12/15  Yes Laurey Morale, MD  methadone (DOLOPHINE) 10 MG tablet Take 10 mg by mouth every 8 (eight) hours as needed for moderate pain.    Yes Historical Provider, MD  metoprolol (LOPRESSOR) 50 MG tablet Take 100 mg by mouth 2 (two) times daily.   Yes Historical Provider, MD  omeprazole (PRILOSEC) 40 MG capsule Take 1 capsule (40 mg total) by mouth daily. 03/05/15  Yes Laurey Morale, MD  oxyCODONE (ROXICODONE) 15 MG immediate release tablet Take 15 mg by mouth every 4 (four) hours as needed for pain.    Yes Historical Provider, MD  potassium chloride (K-DUR,KLOR-CON) 10 MEQ tablet Take 2 tablets (20 mEq total) by mouth 2 (two) times daily. 03/05/15  Yes Laurey Morale, MD  temazepam (RESTORIL) 30 MG capsule Take 1 capsule (30 mg total) by mouth at bedtime as needed for sleep. 03/05/15  Yes Laurey Morale, MD  metoprolol (LOPRESSOR) 50 MG tablet TAKE 1 TABLET BY MOUTH TWICE DAILY Patient not taking: Reported on 06/06/2015 02/22/15   Laurey Morale, MD  potassium chloride (K-DUR) 10 MEQ tablet Take 1 tablet (10 mEq total) by mouth daily. 06/06/15   Winnell Bento Patel-Mills, PA-C   BP 157/76 mmHg  Pulse 56  Temp(Src) 98 F (36.7 C) (Oral)  Resp 16  SpO2 100% Physical Exam  Constitutional: She is oriented to person, place, and time. She appears well-developed and well-nourished.  HENT:  Head: Normocephalic and atraumatic.  Eyes: Conjunctivae are normal.  Neck: Normal range of motion. Neck supple.  Cardiovascular: Normal rate, regular rhythm and normal heart sounds.   Pulmonary/Chest: Effort normal and breath sounds normal. No accessory muscle usage. No respiratory distress. She has no decreased breath  sounds. She has no wheezes.  Lungs clear to auscultation bilaterally. No wheezing or decreased breath sounds.  Abdominal: Soft. There is no tenderness.  Obese. Abdomen is soft and nontender.   Musculoskeletal: Normal range of motion.  Bilateral vertical, linear surgical scars over the knees.    No lower extremity edema.   Neurological: She is alert and oriented to person, place, and time. She has normal strength. No cranial nerve deficit or sensory deficit. Coordination and gait normal. GCS eye subscore is 4. GCS verbal subscore is 5. GCS motor subscore is 6.  Cranial nerves III through XII intact. Normal upper extremity strength. Normal gait and coordination.  Skin: Skin is warm and dry.  Psychiatric: She has a normal mood and affect. Her behavior is normal.  Nursing note and vitals reviewed.   ED Course  Procedures (including critical care time) Labs Review Labs Reviewed  COMPREHENSIVE METABOLIC PANEL - Abnormal; Notable for the following:    Potassium 2.9 (*)    Glucose, Bld 104 (*)    All other components within normal limits  URINALYSIS, ROUTINE W REFLEX MICROSCOPIC (NOT AT [facility]) - Abnormal; Notable for the following:    APPearance CLOUDY (*)    Hgb urine dipstick TRACE (*)    All other components within normal limits   URINE MICROSCOPIC-ADD ON - Abnormal; Notable for the following:    Squamous Epithelial / LPF FEW (*)    All other components within normal limits  LIPASE, BLOOD  CBC  I-STAT TROPOININ, ED    Imaging Review Dg Chest 2 View  06/05/2015   CLINICAL DATA:  54 year old female with chest pain  EXAM: CHEST  2 VIEW  COMPARISON:  Radiograph dated 02/27/2013  FINDINGS: The heart size and mediastinal contours are within normal limits. Both lungs are clear. The visualized skeletal structures are unremarkable.  IMPRESSION: No active cardiopulmonary disease.   Electronically Signed   By: Anner Crete M.D.   On: 06/05/2015 22:50   I have personally reviewed and evaluated these images and lab results as part of my medical decision-making.   EKG Interpretation   Date/Time:  Tuesday June 05 2015 18:14:25 EDT Ventricular Rate:  70 PR Interval:  153 QRS Duration: 84 QT Interval:  423 QTC Calculation: 456 R Axis:   34 Text Interpretation:  Sinus rhythm Probable anteroseptal infarct, old  Borderline repolarization abnormality since last tracing no significant  change Confirmed by MILLER  MD, BRIAN ([id number]) on 06/05/2015 11:14:23 PM      MDM   Final diagnoses:  Chest pain  Hypokalemia  Paresthesia of both hands   Patient presents for multiple complaints including chest pain and numbness/tingling in bilateral hands. She is well appearing and vital signs are stable. She is hypokalemic but otherwise labs are unremarkable. EKG does not show "u waves" or QRS widening and troponin is negative. CXR is negative for edema, infiltrate, or pneumothorax.  Medications  sodium chloride 0.9 % bolus 1,000 mL (1,000 mLs Intravenous New Bag/Given 06/05/15 2321)  potassium chloride SA (K-DUR,KLOR-CON) CR tablet 40 mEq (40 mEq Oral Given 06/05/15 2321)  morphine 4 MG/ML injection 4 mg (4 mg Intravenous Given 06/05/15 2321)  ondansetron (ZOFRAN) injection 4 mg (4 mg Intravenous Given 06/05/15 2321)   I am not  sure of the etiology of her symptoms but have ruled out the life threatening causes of her symptoms. Rx: potassium I discussed return precautions and follow up.  She verbally agrees with the plan.    Ottie Glazier, PA-C  06/06/15 2712  Noemi Chapel, MD 06/07/15 2158

## 2015-06-05 NOTE — ED Provider Notes (Signed)
The patient is a 54 year old female she is presenting to the hospital with a complaint of diarrhea, nausea, cramping and feeling tingling in her bilateral fingers and her lips. She states that she has had prior history of hypokalemia and recent illness with 24 hours of paresthesias. On exam she has normal strength and sensation in all 4 extremities, no observable or objective findings of numbness or weakness, cranial nerves are normal, cardiac and pulmonary exams are normal, EKG shows no prolonged QT, no U waves, potassium is 2.9 and will need replacement. We'll send out on low-dose potassium to follow-up in one week. The patient is in agreement with the plan. Doubt stroke, no indication for CT scan.  Medical screening examination/treatment/procedure(s) were conducted as a shared visit with non-physician practitioner(s) and myself.  I personally evaluated the patient during the encounter.  Clinical Impression:   Final diagnoses:  Chest pain  Hypokalemia  Paresthesia of both hands     EKG Interpretation  Date/Time:  Tuesday June 05 2015 18:14:25 EDT Ventricular Rate:  70 PR Interval:  153 QRS Duration: 84 QT Interval:  423 QTC Calculation: 456 R Axis:   34 Text Interpretation:  Sinus rhythm Probable anteroseptal infarct, old Borderline repolarization abnormality since last tracing no significant change Confirmed by Sabra Heck  MD, [place] ([id number]) on 06/05/2015 11:14:23 PM        Noemi Chapel, MD 06/07/15 2157

## 2015-06-06 LAB — URINALYSIS, ROUTINE W REFLEX MICROSCOPIC
Bilirubin Urine: NEGATIVE
Glucose, UA: NEGATIVE mg/dL
Ketones, ur: NEGATIVE mg/dL
Leukocytes, UA: NEGATIVE
Nitrite: NEGATIVE
Protein, ur: NEGATIVE mg/dL
Specific Gravity, Urine: 1.007 (ref 1.005–1.030)
Urobilinogen, UA: 0.2 mg/dL (ref 0.0–1.0)
pH: 6.5 (ref 5.0–8.0)

## 2015-06-06 LAB — URINE MICROSCOPIC-ADD ON

## 2015-06-06 MED ORDER — POTASSIUM CHLORIDE ER 10 MEQ PO TBCR: 10.0000 meq | EXTENDED_RELEASE_TABLET | Freq: Every day | ORAL | Status: DC

## 2015-06-06 NOTE — Discharge Instructions (Signed)
Paresthesia Follow up with your primary care provider.  Return for chest pain, shortness of breath, headache, or confusion. Take potassium as prescribed.  Paresthesia is a burning or prickling feeling. This feeling can happen in any part of the body. It often happens in the hands, arms, legs, or feet. HOME CARE  Avoid drinking alcohol.  Try massage or needle therapy (acupuncture) to help with your problems.  Keep all doctor visits as told. GET HELP RIGHT AWAY IF:   You feel weak.  You have trouble walking or moving.  You have problems speaking or seeing.  You feel confused.  You cannot control when you poop (bowel movement) or pee (urinate).  You lose feeling (numbness) after an injury.  You pass out (faint).  Your burning or prickling feeling gets worse when you walk.  You have pain, cramps, or feel dizzy.  You have a rash. MAKE SURE YOU:   Understand these instructions.  Will watch your condition.  Will get help right away if you are not doing well or get worse. Document Released: 08/21/2008 Document Revised: 12/01/2011 Document Reviewed: 05/30/2011 [facility] Patient Information 2015 [place], [place]. This information is not intended to replace advice given to you by your health care provider. Make sure you discuss any questions you have with your health care provider.

## 2015-06-12 ENCOUNTER — Other Ambulatory Visit (INDEPENDENT_AMBULATORY_CARE_PROVIDER_SITE_OTHER): Payer: 59

## 2015-06-12 DIAGNOSIS — R300 Dysuria: Secondary | ICD-10-CM | POA: Diagnosis not present

## 2015-06-12 DIAGNOSIS — E119 Type 2 diabetes mellitus without complications: Secondary | ICD-10-CM

## 2015-06-12 LAB — MICROALBUMIN / CREATININE URINE RATIO
Creatinine,U: 225.6 mg/dL
Microalb Creat Ratio: 0.8 mg/g (ref 0.0–30.0)
Microalb, Ur: 1.7 mg/dL (ref 0.0–1.9)

## 2015-06-12 LAB — BASIC METABOLIC PANEL
BUN: 14 mg/dL (ref 6–23)
CO2: 29 mEq/L (ref 19–32)
Calcium: 9.4 mg/dL (ref 8.4–10.5)
Chloride: 102 mEq/L (ref 96–112)
Creatinine, Ser: 0.79 mg/dL (ref 0.40–1.20)
GFR: 96.97 mL/min (ref >60.00)
Glucose, Bld: 100 mg/dL — ABNORMAL HIGH (ref 70–99)
Potassium: 3.4 mEq/L — ABNORMAL LOW (ref 3.5–5.1)
Sodium: 141 mEq/L (ref 135–145)

## 2015-06-12 LAB — HEMOGLOBIN A1C: Hgb A1c MFr Bld: 6.3 % (ref 4.6–6.5)

## 2015-06-18 ENCOUNTER — Encounter: Payer: Self-pay | Admitting: Family Medicine

## 2015-06-18 ENCOUNTER — Ambulatory Visit (INDEPENDENT_AMBULATORY_CARE_PROVIDER_SITE_OTHER): Payer: 59 | Admitting: Family Medicine

## 2015-06-18 VITALS — BP 167/97 | HR 75 | Temp 98.2°F | Ht 67.0 in | Wt 254.0 lb

## 2015-06-18 DIAGNOSIS — E119 Type 2 diabetes mellitus without complications: Secondary | ICD-10-CM | POA: Diagnosis not present

## 2015-06-18 DIAGNOSIS — E876 Hypokalemia: Secondary | ICD-10-CM | POA: Diagnosis not present

## 2015-06-18 DIAGNOSIS — Z23 Encounter for immunization: Secondary | ICD-10-CM | POA: Diagnosis not present

## 2015-06-18 MED ORDER — POTASSIUM CHLORIDE ER 10 MEQ PO TBCR: 30.0000 meq | EXTENDED_RELEASE_TABLET | Freq: Two times a day (BID) | ORAL | Status: DC

## 2015-06-18 NOTE — Progress Notes (Signed)
   Subjective:    Patient ID: [name], female    DOB: Apr 15, 1961, 54 y.o.   MRN: [id number]  HPI Here to follow up on diabetes and to follow up an ER visit on 06-05-15. She has been working hard on her diet and has lost 22 lbs in the past 3 months. Her A1c has come down to 6.3. She has felt good except for 2 weeks ago when she had a GI virus for several days. She had some diarrhea and this apparently dropped her potassium to 2.9. She presented to the ER with numbness and tingling in the hands, feet, and lips. She was given potassium and sent home on her previous dose of 20 mEq bid.    Review of Systems  Constitutional: Negative.   Respiratory: Negative.   Cardiovascular: Negative.   Genitourinary: Negative.   Neurological: Negative for numbness.       Objective:   Physical Exam  Constitutional: She is oriented to person, place, and time. She appears well-developed and well-nourished.  Neck: No thyromegaly present.  Cardiovascular: Normal rate, regular rhythm, normal heart sounds and intact distal pulses.   Pulmonary/Chest: Effort normal and breath sounds normal.  Lymphadenopathy:    She has no cervical adenopathy.  Neurological: She is alert and oriented to person, place, and time.          Assessment & Plan:  Her diabetes is doing very well. She will stay on the current regimen and recheck in 90 days. We will increase her daily potassium to a total of  30 mEq bid.

## 2015-06-18 NOTE — Progress Notes (Signed)
Pre visit review using our clinic review tool, if applicable. No additional management support is needed unless otherwise documented below in the visit note. 

## 2015-06-25 ENCOUNTER — Telehealth: Payer: Self-pay | Admitting: Family Medicine

## 2015-06-25 NOTE — Telephone Encounter (Signed)
Pt seen Monday, sept 26.  Pt states she had a hurting in groin area and dr fry advised possible muscle strain. Now this past Saturday pt states pain while urinating. Pt would like to know if dr fry will call in a rx  [facility]/ [address]

## 2015-06-26 MED ORDER — CIPROFLOXACIN HCL 500 MG PO TABS: 500.0000 mg | ORAL_TABLET | Freq: Two times a day (BID) | ORAL | Status: DC

## 2015-06-26 NOTE — Telephone Encounter (Signed)
Pt aware.

## 2015-06-26 NOTE — Telephone Encounter (Signed)
I sent script e-scribe, tried to reach pt on phone. No answer or option to leave a message.

## 2015-06-26 NOTE — Telephone Encounter (Signed)
Call in Cipro 500 mg bid for 7 days  

## 2015-06-29 ENCOUNTER — Telehealth: Payer: Self-pay | Admitting: Family Medicine

## 2015-06-29 NOTE — Telephone Encounter (Signed)
Error

## 2015-07-02 DIAGNOSIS — T84038A Mechanical loosening of other internal prosthetic joint, initial encounter: Secondary | ICD-10-CM | POA: Insufficient documentation

## 2015-07-02 DIAGNOSIS — Z96659 Presence of unspecified artificial knee joint: Secondary | ICD-10-CM

## 2015-07-03 ENCOUNTER — Ambulatory Visit (INDEPENDENT_AMBULATORY_CARE_PROVIDER_SITE_OTHER): Payer: 59 | Admitting: Family Medicine

## 2015-07-03 ENCOUNTER — Encounter: Payer: Self-pay | Admitting: Family Medicine

## 2015-07-03 VITALS — BP 139/88 | HR 61 | Temp 98.2°F | Ht 67.0 in | Wt 254.0 lb

## 2015-07-03 DIAGNOSIS — N63 Unspecified lump in breast: Secondary | ICD-10-CM | POA: Diagnosis not present

## 2015-07-03 DIAGNOSIS — N6312 Unspecified lump in the right breast, upper inner quadrant: Secondary | ICD-10-CM

## 2015-07-03 NOTE — Progress Notes (Signed)
Pre visit review using our clinic review tool, if applicable. No additional management support is needed unless otherwise documented below in the visit note. 

## 2015-07-03 NOTE — Progress Notes (Signed)
   Subjective:    Patient ID: [name], female    DOB: [birth year], 54 y.o.   MRN: [id number]  HPI Here for a lump in the right breast. She discovered this while in the shower last week. There is no pain or other symptoms. Her last mammogram was normal but this was in 2010.    Review of Systems  Constitutional: Negative.   Respiratory: Negative.   Cardiovascular: Negative.   Skin: Negative.        Objective:   Physical Exam  Constitutional: She appears well-developed and well-nourished.  Pulmonary/Chest:  Both breasts appear normal with no skin changes. The right breast is tender and has a poorly defined lump at the 2:00 o'clock position. The remainder of the breast is benign, as is the entire left breast. Both axillae are clear.           Assessment & Plan:  Right breast lump. We wil order a diagnostic mammogram and an [place] soon.

## 2015-07-09 HISTORY — PX: REVISION TOTAL KNEE ARTHROPLASTY: SHX767

## 2015-07-11 DIAGNOSIS — G8918 Other acute postprocedural pain: Secondary | ICD-10-CM | POA: Insufficient documentation

## 2015-07-20 ENCOUNTER — Telehealth: Payer: Self-pay | Admitting: Family Medicine

## 2015-07-20 NOTE — Telephone Encounter (Signed)
Have her see me early next week

## 2015-07-20 NOTE — Telephone Encounter (Signed)
Herbert Deaner, PT from [facility] called regarding patient blood pressure. After recent right knee total replacement patient blood pressure is seated has been running 160/90 with no chest pain or SOB. He states that she is working on blood pressure. Herbert Deaner can be reached at [phone].

## 2015-07-20 NOTE — Telephone Encounter (Signed)
I spoke with pt and she has 2 appointments at the beginning of week and she will follow up with [place] as needed, also she will continue to monitor blood pressure.

## 2015-07-23 ENCOUNTER — Other Ambulatory Visit: Payer: 59

## 2015-07-24 DIAGNOSIS — Z96659 Presence of unspecified artificial knee joint: Secondary | ICD-10-CM | POA: Insufficient documentation

## 2015-07-30 ENCOUNTER — Other Ambulatory Visit: Payer: Self-pay | Admitting: Family Medicine

## 2015-07-30 ENCOUNTER — Ambulatory Visit
Admission: RE | Admit: 2015-07-30 | Discharge: 2015-07-30 | Disposition: A | Discharge status: Home / Self Care | Payer: 59 | Source: Ambulatory Visit | Attending: Family Medicine | Admitting: Family Medicine

## 2015-07-30 DIAGNOSIS — N6312 Unspecified lump in the right breast, upper inner quadrant: Secondary | ICD-10-CM

## 2015-07-30 DIAGNOSIS — N6310 Unspecified lump in the right breast, unspecified quadrant: Secondary | ICD-10-CM

## 2015-08-01 ENCOUNTER — Other Ambulatory Visit: Payer: Self-pay | Admitting: Family Medicine

## 2015-08-01 DIAGNOSIS — N6310 Unspecified lump in the right breast, unspecified quadrant: Secondary | ICD-10-CM

## 2015-08-02 ENCOUNTER — Other Ambulatory Visit: Payer: Self-pay | Admitting: Family Medicine

## 2015-08-02 ENCOUNTER — Ambulatory Visit
Admission: RE | Admit: 2015-08-02 | Discharge: 2015-08-02 | Disposition: A | Discharge status: Home / Self Care | Payer: 59 | Source: Ambulatory Visit | Attending: Family Medicine | Admitting: Family Medicine

## 2015-08-02 DIAGNOSIS — N6310 Unspecified lump in the right breast, unspecified quadrant: Secondary | ICD-10-CM

## 2015-08-08 ENCOUNTER — Telehealth: Payer: Self-pay | Admitting: *Deleted

## 2015-08-08 ENCOUNTER — Other Ambulatory Visit: Payer: Self-pay | Admitting: Family Medicine

## 2015-08-08 ENCOUNTER — Encounter: Payer: Self-pay | Admitting: *Deleted

## 2015-08-08 DIAGNOSIS — C50311 Malignant neoplasm of lower-inner quadrant of right female breast: Secondary | ICD-10-CM | POA: Insufficient documentation

## 2015-08-08 DIAGNOSIS — Z170 Estrogen receptor positive status [ER+]: Secondary | ICD-10-CM

## 2015-08-08 HISTORY — DX: Malignant neoplasm of lower-inner quadrant of right female breast: C50.311

## 2015-08-08 NOTE — Telephone Encounter (Signed)
Confirmed [facility] for 08/15/15 at 0830 .  Instructions and contact information given.

## 2015-08-09 ENCOUNTER — Telehealth: Payer: Self-pay | Admitting: *Deleted

## 2015-08-09 NOTE — Telephone Encounter (Signed)
Mailed before appt letter packet to pt.

## 2015-08-13 ENCOUNTER — Ambulatory Visit: Payer: 59 | Admitting: Family Medicine

## 2015-08-15 ENCOUNTER — Ambulatory Visit: Payer: 59 | Attending: General Surgery | Admitting: Physical Therapy

## 2015-08-15 ENCOUNTER — Other Ambulatory Visit (HOSPITAL_BASED_OUTPATIENT_CLINIC_OR_DEPARTMENT_OTHER): Payer: 59

## 2015-08-15 ENCOUNTER — Encounter: Payer: Self-pay | Admitting: Skilled Nursing Facility1

## 2015-08-15 ENCOUNTER — Ambulatory Visit
Admission: RE | Admit: 2015-08-15 | Discharge: 2015-08-15 | Disposition: A | Discharge status: Home / Self Care | Payer: 59 | Source: Ambulatory Visit | Attending: Radiation Oncology | Admitting: Radiation Oncology

## 2015-08-15 ENCOUNTER — Encounter: Payer: Self-pay | Admitting: Physical Therapy

## 2015-08-15 ENCOUNTER — Encounter: Payer: Self-pay | Admitting: Nurse Practitioner

## 2015-08-15 ENCOUNTER — Ambulatory Visit (HOSPITAL_BASED_OUTPATIENT_CLINIC_OR_DEPARTMENT_OTHER): Payer: 59 | Admitting: Oncology

## 2015-08-15 ENCOUNTER — Encounter: Payer: Self-pay | Admitting: Oncology

## 2015-08-15 VITALS — BP 154/85 | HR 64 | Temp 97.9°F | Resp 18 | Ht 67.0 in | Wt 249.6 lb

## 2015-08-15 DIAGNOSIS — C50311 Malignant neoplasm of lower-inner quadrant of right female breast: Secondary | ICD-10-CM | POA: Diagnosis present

## 2015-08-15 DIAGNOSIS — R293 Abnormal posture: Secondary | ICD-10-CM | POA: Insufficient documentation

## 2015-08-15 DIAGNOSIS — Z170 Estrogen receptor positive status [ER+]: Secondary | ICD-10-CM

## 2015-08-15 LAB — CBC WITH DIFFERENTIAL/PLATELET
BASO%: 0.8 % (ref 0.0–2.0)
Basophils Absolute: 0.1 10*3/uL (ref 0.0–0.1)
EOS%: 2.1 % (ref 0.0–7.0)
Eosinophils Absolute: 0.2 10*3/uL (ref 0.0–0.5)
HCT: 37.4 % (ref 34.8–46.6)
HGB: 12.5 g/dL (ref 11.6–15.9)
LYMPH%: 31.5 % (ref 14.0–49.7)
MCH: 29.3 pg (ref 25.1–34.0)
MCHC: 33.3 g/dL (ref 31.5–36.0)
MCV: 87.9 fL (ref 79.5–101.0)
MONO#: 0.4 10*3/uL (ref 0.1–0.9)
MONO%: 5 % (ref 0.0–14.0)
NEUT#: 4.7 10*3/uL (ref 1.5–6.5)
NEUT%: 60.6 % (ref 38.4–76.8)
Platelets: 354 10*3/uL (ref 145–400)
RBC: 4.26 10*6/uL (ref 3.70–5.45)
RDW: 13.5 % (ref 11.2–14.5)
WBC: 7.8 10*3/uL (ref 3.9–10.3)
lymph#: 2.4 10*3/uL (ref 0.9–3.3)

## 2015-08-15 LAB — COMPREHENSIVE METABOLIC PANEL (CC13)
ALT: <9 U/L (ref 0–55)
AST: 14 U/L (ref 5–34)
Albumin: 4.2 g/dL (ref 3.5–5.0)
Alkaline Phosphatase: 75 U/L (ref 40–150)
Anion Gap: 11 mEq/L (ref 3–11)
BUN: 11.5 mg/dL (ref 7.0–26.0)
CO2: 28 mEq/L (ref 22–29)
Calcium: 10 mg/dL (ref 8.4–10.4)
Chloride: 103 mEq/L (ref 98–109)
Creatinine: 0.9 mg/dL (ref 0.6–1.1)
EGFR: 82 mL/min/{1.73_m2} — ABNORMAL LOW (ref >90)
Glucose: 106 mg/dl (ref 70–140)
Potassium: 3.1 mEq/L — ABNORMAL LOW (ref 3.5–5.1)
Sodium: 141 mEq/L (ref 136–145)
Total Bilirubin: 0.35 mg/dL (ref 0.20–1.20)
Total Protein: 7.4 g/dL (ref 6.4–8.3)

## 2015-08-15 NOTE — Progress Notes (Signed)
Subjective:     Patient ID: [name], female   DOB: Sep 25, 1960, 54 y.o.   MRN: [id number]  HPI   Review of Systems     Objective:   Physical Exam For the patient to understand and be given the tools to implement a healthy plant based diet during their cancer diagnosis.     Assessment:     Patient was seen today and found to be.... Pts labs: K 3.1. Pts ht 5'7'', wt 249 pounds, and BMI 39.2. Pts medications: omeprezole, furosemide, lisinopril, methadone, oxycodone. Pt does have diabetes.      Plan:     Dietitian educated the patient on implementing a plant based diet by incorporating more plant proteins, fruits, and vegetables. As a part of a healthy routine physical activity was discussed. The importance of legitimate, evidence based information was discussed and examples were given. A folder of evidence based information with a focus on a plant based diet and general nutrition during cancer was given to the patient.  As a part of the continuum of care the cancer dietitian's contact information was given to the patient in the event they would like to have a follow up appointment.

## 2015-08-15 NOTE — Progress Notes (Signed)
[place]  [phone] [phone]     ID: [name] DOB: Dec 20, 1960  MR#: [id number]  UQJ#:[id number]  Patient Care Team: Laurey Morale, MD as PCP - [place] III, MD as Consulting Physician (General Surgery) Chauncey Cruel, MD as Consulting Physician (Oncology) Thea Silversmith, MD as Consulting Physician (Radiation Oncology) PCP: Laurey Morale, MD GYN: OTHER MD: Mardi Mainland MD, Nicholaus Bloom MD  CHIEF COMPLAINT: Multicentric breast cancer  CURRENT TREATMENT: Awaiting definitive surgery   BREAST CANCER HISTORY: [name] herself noted a change in her right breast in October and brought it to the attention of her primary care physician. Her last mammogram had been April 2010. Dr. Sarajane Jews set [name] up for bilateral diagnostic mammography with tomosynthesis and [facility]11[facility]2015. This found the breast density to be category B. In the right breast there were 3 microlobulated masses in the lower inner quadrant measuring 1.9, 1.5, and 1.5 cm. These were multicentric. There were also diffuse fine pleomorphic calcifications surrounding these masses, that area spanning 11.6 cm. The masses were palpable by exam at 4:00 8 cm from the nipple and at 5:00 3 cm from the nipple. By ultrasonography there was an irregular hypoechoic mass in the right breast at 4:00 measuring 1.7 cm, 1 medial to this measuring 1.1 cm, and then the third irregular hypoechoic mass at the 5:00 position measuring 1.3 cm. The right axilla showed multiple lymph nodes with thickened cortices. The largest lymph node measured 0.9 cm.  On 08/02/2015, [name] underwent biopsy of all 3 breast masses as well as a right axillary lymph node. 2 of the tumors were similar invasive ductal carcinomas, grade 2, estrogen receptor 95% positive, progesterone receptor 5% positive, with an MIB-1 of 10%, and HER-2 equivocal, with a signals ratio  of 1.30 and the number per cell 4.0. The third tumor appeared's grade 1 or 2 was also estrogen receptor positive at 95%, progesterone receptor positive at 5%, with an MIP-1 of 5%, and a similar HER-2 profile the signals ratio being 1.46 but the number per cell being only 3.95. By immunohistochemistry on this tumor HER-2 was negative at 1+. Immunohistochemistry on the equivocal reading is pending.  [name]'s subsequent history is as detailed below  INTERVAL HISTORY:  [name] was evaluated in the multidisciplinary breast cancer clinic 08/15/2015 accompanied her sisters [name], and [name]. Her case was also presented in the multidisciplinary breast cancer conference that same morning. At that time a preliminary plan was proposed: Mastectomy with sentinel lymph node sampling, plus or minus radiation to follow, Oncotype to help with the chemotherapy decision, and antiestrogen therapy. Of course the definitive plan will depend on the final HER-2 reading   REVIEW OF SYSTEMS: Aside from the palpable mass itself, there were no specific symptoms leading to the recent mammogram. The patient denies unusual headaches, visual changes, nausea, vomiting, stiff neck, dizziness, or gait imbalance. There has been no cough, phlegm production, or pleurisy, no chest pain or pressure, and no change in bowel or bladder habits. The patient denies fever, rash, bleeding, unexplained fatigue or unexplained weight loss. [name] does complain of hot flashes and night sweats. This causes her some insomnia. She has some heartburn. She underwent revision of a knee replacement at [facility] 07/09/2015 under Dr Landis Gandy. She is using a cane at present. She also has chronic back and joint pain "here and there" which is however not more intense or persistent than before. Pain management  is through Dr. Hardin Negus pain clinic. A detailed review of systems was otherwise entirely negative.  PAST MEDICAL HISTORY: Past Medical History  Diagnosis Date   . Osteoarthritis   . Headache(784.0)     migraine like per patient  . Hypertension   . Low back pain     dr phillips, pain management  . Degenerative joint disease   . Gallstones   . GERD (gastroesophageal reflux disease)   . DDD (degenerative disc disease), cervical   . Colon polyps   . Diverticulosis   . Renal disorder   . Diabetes mellitus without complication ([place])   . Breast cancer of lower-inner quadrant of right female breast ([place]) 08/08/2015  . Breast cancer ([place])     PAST SURGICAL HISTORY: Past Surgical History  Procedure Laterality Date  . Replacement total knee Right     right - dr Novella Olive  . Ganglion cyst excision Right     right wrist  . Lipoma excision Right 02/21/08    right hip, [facility]  . Replacement total knee Left 06/25/09    left - dr Marciano Sequin [facility]  . Tonsillectomy    . Joint replacement      both knees have been done, four times each per patient. Her body rejects the implant, no infection.  . Abdominal hysterectomy  06/22/06    lap, dr Mancel Bale  . Other surgical history      lipoma removed from back 2012   . Cholecystectomy  10/28/2011    Procedure: LAPAROSCOPIC CHOLECYSTECTOMY WITH INTRAOPERATIVE CHOLANGIOGRAM;  Surgeon: Earnstine Regal, MD;  Location: [facility];  Service: General;  Laterality: N/A;  . Mass excision  08/24/2012    Procedure: EXCISION MASS;  Surgeon: Earnstine Regal, MD;  Location: [place];  Service: General;  Laterality: Left;  excisie soft tissue masses left shoulder(back) & left upper arm  . Colonoscopy  02-03-13    per Dr. Henrene Pastor, diverticuli but no polyps, repeat in 5 yrs   . Mass excision Left 01/24/2014    Procedure: EXCISION SOFT TISSUE MASSES LOWER LEFT BACK;  Surgeon: Earnstine Regal, MD;  Location: [place];  Service: General;  Laterality: Left;  . Esophagogastroduodenoscopy  02-03-13    per Dr. Henrene Pastor, normal     FAMILY HISTORY Family History  Problem Relation Age of Onset  . Crohn's disease Other      nephew  . Stomach cancer Mother   . Heart disease Father   . Prostate cancer Father   . Kidney cancer Sister     one out of the four  . Hypertension Sister   . Hypertension Brother   . Colon cancer Maternal Grandmother   The patient's father died at the age of 65 with congestive heart failure. Her mother is still living as of November 2016, age 50. The patient had 6 brothers, 4 sisters. One sister was diagnosed with kidney cancer at age 105. ([place]). She is doing well. The patient's maternal grandmother was diagnosed with colon cancer at the age of 42. The patient's mother was diagnosed with a "rare stomach cancer" 20 years ago. The patient's father was diagnosed with prostate cancer at age 36. There is no history of breast or ovarian cancer in the family to her knowledge.   GYNECOLOGIC HISTORY:  No LMP recorded. Patient has had a hysterectomy.  menarche age 28, first live birth age 1, the patient is GX P3. She underwent remote hysterectomy with bilateral salpingo-oophorectomy. She did not use  hormone replacement. She never used oral contraceptives.   SOCIAL HISTORY:  [name] used to work as a "[employer] but she is now disabled because of her multiple arthritic and degenerative problems. Her husband [name] works as a Freight forwarder. Son [name] works in long care in [place]. Son [name] works for a Arboriculturist in [place]. Daughter  [name] is a Haematologist in [place]. The patient has 5 grandchildren. She attends a Physicist, medical church    ADVANCED DIRECTIVES: Not in place   HEALTH MAINTENANCE: Social History  Substance Use Topics  . Smoking status: Former Smoker -- 20 years    Types: Cigarettes  . Smokeless tobacco: Never Used     Comment: trying to quit, 2 per day  . Alcohol Use: No     Colonoscopy:February 2016   KNL:ZJQBHA post hysterectomy   Bone density:  Lipid panel:  Allergies  Allergen Reactions  .  Codeine Itching and Nausea And Vomiting    Itching all over the body  . Sulfonamide Derivatives Hives    All over the body  . Latex Hives    Current Outpatient Prescriptions  Medication Sig Dispense Refill  . amitriptyline (ELAVIL) 25 MG tablet Take 25 mg by mouth at bedtime.     Marland Kitchen amLODipine (NORVASC) 5 MG tablet Take 1 tablet (5 mg total) by mouth 2 (two) times daily. (Patient taking differently: Take 10 mg by mouth 2 (two) times daily. ) 180 tablet 3  . furosemide (LASIX) 20 MG tablet TAKE 1 TABLET BY MOUTH EVERY DAY 30 tablet 2  . lisinopril (PRINIVIL,ZESTRIL) 10 MG tablet Take 1 tablet (10 mg total) by mouth daily. 90 tablet 3  . metFORMIN (GLUCOPHAGE) 500 MG tablet Take 1 tablet (500 mg total) by mouth 2 (two) times daily with a meal. 180 tablet 3  . methadone (DOLOPHINE) 10 MG tablet Take 10 mg by mouth every 8 (eight) hours as needed for moderate pain.     . metoprolol (LOPRESSOR) 50 MG tablet Take 100 mg by mouth 2 (two) times daily.    Marland Kitchen omeprazole (PRILOSEC) 40 MG capsule Take 1 capsule (40 mg total) by mouth daily. 90 capsule 3  . oxyCODONE (ROXICODONE) 15 MG immediate release tablet Take 15 mg by mouth every 4 (four) hours as needed for pain.     . potassium chloride (K-DUR) 10 MEQ tablet Take 3 tablets (30 mEq total) by mouth 2 (two) times daily. 180 tablet 11  . temazepam (RESTORIL) 30 MG capsule Take 1 capsule (30 mg total) by mouth at bedtime as needed for sleep. 90 capsule 1   No current facility-administered medications for this visit.    OBJECTIVE: middle-aged African-American woman who appears older than stated age  74 Vitals:   08/15/15 0857  BP: 154/85  Pulse: 64  Temp: 97.9 F (36.6 C)  Resp: 18     Body mass index is 39.08 kg/(m^2).    ECOG FS:2 - Symptomatic, <50% confined to bed  Ocular: Sclerae unictericEOMs intact\ Oropharynx clear and moist Lymphatic: No cervical or supraclavicular adenopathy Lungs no rales or rhonchi, good excursion  bilaterally Heart regular rate and rhythm, no murmur appreciated Abd soft, nontender, positive bowel sounds MSK no focal spinal tenderness, no upper extremity lymphedema Neuro  non-focal, well-oriented, appropriate affect Breasts: In the medial aspect of the right breast there are poorly defined masses measuring between 1 and 2 cm, not associated with erythema or nipple retraction. The left breast is unremarkable.  LAB RESULTS:  CMP     Component Value Date/Time   NA 141 08/15/2015 0836   NA 141 06/12/2015 0832   K 3.1* 08/15/2015 0836   K 3.4* 06/12/2015 0832   CL 102 06/12/2015 0832   CO2 28 08/15/2015 0836   CO2 29 06/12/2015 0832   GLUCOSE 106 08/15/2015 0836   GLUCOSE 100* 06/12/2015 0832   BUN 11.5 08/15/2015 0836   BUN 14 06/12/2015 0832   CREATININE 0.9 08/15/2015 0836   CREATININE 0.79 06/12/2015 0832   CALCIUM 10.0 08/15/2015 0836   CALCIUM 9.4 06/12/2015 0832   PROT 7.4 08/15/2015 0836   PROT 7.7 06/05/2015 1837   ALBUMIN 4.2 08/15/2015 0836   ALBUMIN 4.8 06/05/2015 1837   AST 14 08/15/2015 0836   AST 24 06/05/2015 1837   ALT <9 08/15/2015 0836   ALT 14 06/05/2015 1837   ALKPHOS 75 08/15/2015 0836   ALKPHOS 69 06/05/2015 1837   BILITOT 0.35 08/15/2015 0836   BILITOT 0.5 06/05/2015 1837   GFRNONAA >60 06/05/2015 1837   GFRAA >60 06/05/2015 1837    INo results found for: SPEP, UPEP  Lab Results  Component Value Date   WBC 7.8 08/15/2015   NEUTROABS 4.7 08/15/2015   HGB 12.5 08/15/2015   HCT 37.4 08/15/2015   MCV 87.9 08/15/2015   PLT 354 08/15/2015      Chemistry      Component Value Date/Time   NA 141 08/15/2015 0836   NA 141 06/12/2015 0832   K 3.1* 08/15/2015 0836   K 3.4* 06/12/2015 0832   CL 102 06/12/2015 0832   CO2 28 08/15/2015 0836   CO2 29 06/12/2015 0832   BUN 11.5 08/15/2015 0836   BUN 14 06/12/2015 0832   CREATININE 0.9 08/15/2015 0836   CREATININE 0.79 06/12/2015 0832      Component Value Date/Time   CALCIUM 10.0  08/15/2015 0836   CALCIUM 9.4 06/12/2015 0832   ALKPHOS 75 08/15/2015 0836   ALKPHOS 69 06/05/2015 1837   AST 14 08/15/2015 0836   AST 24 06/05/2015 1837   ALT <9 08/15/2015 0836   ALT 14 06/05/2015 1837   BILITOT 0.35 08/15/2015 0836   BILITOT 0.5 06/05/2015 1837       No results found for: LABCA2  No components found for: [id number]  No results for input(s): INR in the last 168 hours.  Urinalysis    Component Value Date/Time   COLORURINE YELLOW 06/05/2015 2353   APPEARANCEUR CLOUDY* 06/05/2015 2353   LABSPEC 1.007 06/05/2015 2353   PHURINE 6.5 06/05/2015 2353   GLUCOSEU NEGATIVE 06/05/2015 2353   HGBUR TRACE* 06/05/2015 2353   HGBUR large 07/01/2010 0945   BILIRUBINUR NEGATIVE 06/05/2015 2353   BILIRUBINUR n 02/26/2015 1121   KETONESUR NEGATIVE 06/05/2015 2353   PROTEINUR NEGATIVE 06/05/2015 2353   PROTEINUR n 02/26/2015 1121   UROBILINOGEN 0.2 06/05/2015 2353   UROBILINOGEN 0.2 02/26/2015 1121   NITRITE NEGATIVE 06/05/2015 2353   NITRITE n 02/26/2015 1121   LEUKOCYTESUR NEGATIVE 06/05/2015 2353    STUDIES: Mm Digital Diagnostic Unilat R  08/02/2015  CLINICAL DATA:  Status post ultrasound-guided biopsy of 3 right breast masses, and right axillary lymph node EXAM: DIAGNOSTIC RIGHT MAMMOGRAM POST ULTRASOUND BIOPSY COMPARISON:  Previous exam(s). FINDINGS: Mammographic images were obtained following ultrasound guided biopsy of 2 masses in the right breast 4 o'clock, 1 mass in the right breast 5 o'clock location, and 1 right axillary lymph node. Two-view mammography demonstrates ribbon shaped marker within the right  breast 4 o'clock, posterior depth, coil shaped marker within the right breast 4 o'clock more anterior mass, heart shaped marker within the right breast 5 o'clock, and spring shaped marker within the right axillary lymph node. Expected post biopsy changes are seen. IMPRESSION: Successful placement of for post biopsy markers, status post ultrasound-guided core needle  biopsy of 3 right breast masses, and a right axillary lymph node. Final Assessment: Post Procedure Mammograms for Marker Placement Electronically Signed   By: Fidela Salisbury M.D.   On: 08/02/2015 16:40   US Breast Ltd Uni Right Inc Axilla  07/30/2015  CLINICAL DATA:  54 year old female complaining of a palpable abnormality in the right breast. EXAM: DIGITAL DIAGNOSTIC BILATERAL MAMMOGRAM WITH 3D TOMOSYNTHESIS WITH CAD ULTRASOUND RIGHT BREAST COMPARISON:  Previous exam(s). ACR Breast Density Category b: There are scattered areas of fibroglandular density. FINDINGS: No suspicious mass, malignant type microcalcifications or distortion detected in the left breast. In the right breast there has been interval development of 3 micro lobulated masses in the lower inner quadrant of the breast. There are 2 masses in the 4 o'clock region of the breast measuring 1.9 x 1.7 x 1.2 cm and 1.5 x 1.3 x 1.1 cm. These are located 1.4 cm apart. There is a third mass in the middle third of the 5 o'clock region of the breast measuring 1.5 x 1.2 x 1.1 cm. There are diffuse fine pleomorphic calcifications surrounding the masses spanning an area of 11.6 cm. Mammographic images were processed with CAD. On physical exam, I palpate discrete masses in the right breast at 4 o'clock 8 cm from the nipple and at 5 o'clock 3 cm from the nipple. Targeted ultrasound is performed, showing an irregular hypoechoic mass in the right breast at 4 o'clock 9 cm from the nipple measuring 1.5 x 1.3 x 1.7 cm. One cm medial to this is an irregular hypoechoic mass in the right breast at 4 o'clock 8 cm from the nipple measuring 1.0 x 0.9 x 1.1 cm. There is an irregular hypoechoic mass in the right breast at 5 o'clock 3 cm from the nipple measuring 0.8 x 1.3 x 0.9 cm. Sonographic evaluation of the right axilla shows multiple lymph nodes with thickened cortices. The largest lymph node measures 0.9 cm. IMPRESSION: Imaging findings are concerning for 3 invasive  mammary carcinomas in the lower-inner quadrant of the right breast with axillary metastasis. There are also diffuse fine pleomorphic calcifications concerning for ductal carcinoma in-situ. RECOMMENDATION: Ultrasound-guided core biopsies of the mass in the right breast at 4 o'clock 9 cm from the nipple, mass at 5 o'clock 3 cm from the nipple and an axillary lymph node is recommended. This will be scheduled at the patient's convenience. I have discussed the findings and recommendations with the patient. Results were also provided in writing at the conclusion of the visit. If applicable, a reminder letter will be sent to the patient regarding the next appointment. BI-RADS CATEGORY  5: Highly suggestive of malignancy. Electronically Signed   By: Lillia Mountain M.D.   On: 07/30/2015 12:12   Mm Diag Breast Tomo Bilateral  07/30/2015  CLINICAL DATA:  55 year old female complaining of a palpable abnormality in the right breast. EXAM: DIGITAL DIAGNOSTIC BILATERAL MAMMOGRAM WITH 3D TOMOSYNTHESIS WITH CAD ULTRASOUND RIGHT BREAST COMPARISON:  Previous exam(s). ACR Breast Density Category b: There are scattered areas of fibroglandular density. FINDINGS: No suspicious mass, malignant type microcalcifications or distortion detected in the left breast. In the right breast there has been interval development  of 3 micro lobulated masses in the lower inner quadrant of the breast. There are 2 masses in the 4 o'clock region of the breast measuring 1.9 x 1.7 x 1.2 cm and 1.5 x 1.3 x 1.1 cm. These are located 1.4 cm apart. There is a third mass in the middle third of the 5 o'clock region of the breast measuring 1.5 x 1.2 x 1.1 cm. There are diffuse fine pleomorphic calcifications surrounding the masses spanning an area of 11.6 cm. Mammographic images were processed with CAD. On physical exam, I palpate discrete masses in the right breast at 4 o'clock 8 cm from the nipple and at 5 o'clock 3 cm from the nipple. Targeted ultrasound is  performed, showing an irregular hypoechoic mass in the right breast at 4 o'clock 9 cm from the nipple measuring 1.5 x 1.3 x 1.7 cm. One cm medial to this is an irregular hypoechoic mass in the right breast at 4 o'clock 8 cm from the nipple measuring 1.0 x 0.9 x 1.1 cm. There is an irregular hypoechoic mass in the right breast at 5 o'clock 3 cm from the nipple measuring 0.8 x 1.3 x 0.9 cm. Sonographic evaluation of the right axilla shows multiple lymph nodes with thickened cortices. The largest lymph node measures 0.9 cm. IMPRESSION: Imaging findings are concerning for 3 invasive mammary carcinomas in the lower-inner quadrant of the right breast with axillary metastasis. There are also diffuse fine pleomorphic calcifications concerning for ductal carcinoma in-situ. RECOMMENDATION: Ultrasound-guided core biopsies of the mass in the right breast at 4 o'clock 9 cm from the nipple, mass at 5 o'clock 3 cm from the nipple and an axillary lymph node is recommended. This will be scheduled at the patient's convenience. I have discussed the findings and recommendations with the patient. Results were also provided in writing at the conclusion of the visit. If applicable, a reminder letter will be sent to the patient regarding the next appointment. BI-RADS CATEGORY  5: Highly suggestive of malignancy. Electronically Signed   By: Lillia Mountain M.D.   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 INVASIVE DUCTAL CARCINOMA of the Right breast at the 4 o'clock location, second lesion (coil clip). Grade II INVASIVE DUCTAL CARCINOMA of the Right breast at the 5 o'clock location (heart clip). THERE IS NO EVIDENCE OF CARCINOMA in the Right axillary lymph node (spring  clip). This was found to be concordant by Dr. Fidela Salisbury. Pathology results were discussed with the patient via telephone. She reported no problems at the biopsy sites and is doing well. Post biopsy care and instructions were reviewed and questions were answered. The patient was encouraged to call [facility] with any additional questions and or concerns. She was [facility] to [facility] on August 15, 2015. Pathology results reported by Terie Purser RN on August 07, 2015. Electronically Signed   By: Fidela Salisbury M.D.   On: 08/07/2015 15:09  08/07/2015  CLINICAL DATA:  Three right breast masses with suspicious appearance. EXAM: ULTRASOUND GUIDED RIGHT BREAST CORE NEEDLE BIOPSY COMPARISON:  Previous exam(s). FINDINGS: I met with the patient and we discussed the procedure of ultrasound-guided biopsy, including benefits and alternatives. We discussed the high  likelihood of a successful procedure. We discussed the risks of the procedure, including infection, bleeding, tissue injury, clip migration, and inadequate sampling. Informed written consent was given. The usual time-out protocol was performed immediately prior to the procedure. Initially right breast 4 o'clock, first lesion was biopsied. Using sterile technique and 2% Lidocaine as local anesthetic, under direct ultrasound visualization, a 14 gauge spring-loaded device was used to perform biopsy of right breast 4 o'clock using a media approach. At the conclusion of the procedure a ribbon shaped tissue marker clip was deployed into the biopsy cavity. Next, a right breast 4 o'clock, second lesion was biopsied. Using sterile technique and 2% Lidocaine as local anesthetic, under direct ultrasound visualization, a 14 gauge spring-loaded device was used to perform biopsy of right breast 4 o'clock using a media approach. At the conclusion of the procedure a coil shaped tissue marker clip  was deployed into the biopsy cavity. Next, a right breast 5 o'clock mass was biopsied. Using sterile technique and 2% Lidocaine as local anesthetic, under direct ultrasound visualization, a 14 gauge spring-loaded device was used to perform biopsy of right breast 5 o'clock mass using a media approach. At the conclusion of the procedure a heart shaped tissue marker clip was deployed into the biopsy cavity. Next, right axillary lymph node was sampled. Using sterile technique and 2% Lidocaine as local anesthetic, under direct ultrasound visualization, a 14 gauge spring-loaded device was used to perform biopsy of right axilla using a inferior approach. At the conclusion of the procedure a spring shaped tissue marker clip was deployed into the biopsy cavity. Follow up 2 view mammogram was performed and dictated separately. IMPRESSION: Ultrasound guided biopsy of right breast 4 o'clock 2 masses, right breast 5 o'clock mass, and right axillary lymph node. No apparent complications. Electronically Signed: By: Fidela Salisbury M.D. 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 INVASIVE DUCTAL CARCINOMA of the Right breast at the 4 o'clock location, second lesion (coil clip). Grade II INVASIVE DUCTAL CARCINOMA of the Right breast at the 5 o'clock location (heart clip). THERE IS NO EVIDENCE OF CARCINOMA in the Right axillary lymph node (spring clip). This was found to be concordant by Dr. Fidela Salisbury. Pathology results were discussed with the patient via telephone. She reported no problems at the biopsy sites and is doing well. Post biopsy care and instructions were reviewed and questions were answered. The patient was encouraged to call The  [facility] with any additional questions and or concerns. She was referred to [facility] on August 15, 2015. Pathology results reported by Terie Purser RN on August 07, 2015. Electronically Signed   By: Fidela Salisbury M.D.   On: 08/07/2015 15:09  08/07/2015  CLINICAL DATA:  Three right breast masses with suspicious appearance. EXAM: ULTRASOUND GUIDED RIGHT BREAST CORE NEEDLE BIOPSY COMPARISON:  Previous exam(s). FINDINGS: I met with the patient and we discussed the procedure of ultrasound-guided biopsy, including benefits and alternatives. We discussed the high likelihood of a successful procedure. We discussed the risks of the procedure, including infection, bleeding, tissue injury, clip migration, and inadequate sampling. Informed written consent was given. The usual time-out protocol was performed immediately prior to the procedure.  Initially right breast 4 o'clock, first lesion was biopsied. Using sterile technique and 2% Lidocaine as local anesthetic, under direct ultrasound visualization, a 14 gauge spring-loaded device was used to perform biopsy of right breast 4 o'clock using a media approach. At the conclusion of the procedure a ribbon shaped tissue marker clip was deployed into the biopsy cavity. Next, a right breast 4 o'clock, second lesion was biopsied. Using sterile technique and 2% Lidocaine as local anesthetic, under direct ultrasound visualization, a 14 gauge spring-loaded device was used to perform biopsy of right breast 4 o'clock using a media approach. At the conclusion of the procedure a coil shaped tissue marker clip was deployed into the biopsy cavity. Next, a right breast 5 o'clock mass was biopsied. Using sterile technique and 2% Lidocaine as local anesthetic, under direct ultrasound visualization, a 14 gauge spring-loaded device was used to perform biopsy of right breast 5 o'clock mass using a media approach. At the  conclusion of the procedure a heart shaped tissue marker clip was deployed into the biopsy cavity. Next, right axillary lymph node was sampled. Using sterile technique and 2% Lidocaine as local anesthetic, under direct ultrasound visualization, a 14 gauge spring-loaded device was used to perform biopsy of right axilla using a inferior approach. At the conclusion of the procedure a spring shaped tissue marker clip was deployed into the biopsy cavity. Follow up 2 view mammogram was performed and dictated separately. IMPRESSION: Ultrasound guided biopsy of right breast 4 o'clock 2 masses, right breast 5 o'clock mass, and right axillary lymph node. No apparent complications. Electronically Signed: By: Fidela Salisbury M.D. 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 INVASIVE DUCTAL CARCINOMA of the Right breast at the 4 o'clock location, second lesion (coil clip). Grade II INVASIVE DUCTAL CARCINOMA of the Right breast at the 5 o'clock location (heart clip). THERE IS NO EVIDENCE OF CARCINOMA in the Right axillary lymph node (spring clip). This was found to be concordant by Dr. Fidela Salisbury. Pathology results were discussed with the patient via telephone. She reported no problems at the biopsy sites and is doing well. Post biopsy care and instructions were reviewed and questions were answered. The patient was encouraged to call [facility] with any additional questions and or concerns. She was referred to [facility] on August 15, 2015. Pathology results reported by Terie Purser RN on August 07, 2015. Electronically Signed   By: Fidela Salisbury M.D.   On: 08/07/2015  15:09  08/07/2015  CLINICAL DATA:  Three right breast masses with suspicious appearance. EXAM: ULTRASOUND GUIDED RIGHT BREAST CORE NEEDLE BIOPSY COMPARISON:  Previous exam(s). FINDINGS: I met with the patient and we discussed the procedure of ultrasound-guided biopsy, including benefits and alternatives. We discussed the high likelihood of a successful procedure. We discussed the risks of the procedure, including infection, bleeding, tissue injury, clip migration, and inadequate sampling. Informed written consent was given. The usual time-out protocol was performed immediately prior to the procedure. Initially right breast 4 o'clock, first lesion was biopsied. Using sterile technique and 2% Lidocaine as local anesthetic, under direct ultrasound visualization, a 14 gauge spring-loaded device was used to perform biopsy of right breast 4 o'clock using  a media approach. At the conclusion of the procedure a ribbon shaped tissue marker clip was deployed into the biopsy cavity. Next, a right breast 4 o'clock, second lesion was biopsied. Using sterile technique and 2% Lidocaine as local anesthetic, under direct ultrasound visualization, a 14 gauge spring-loaded device was used to perform biopsy of right breast 4 o'clock using a media approach. At the conclusion of the procedure a coil shaped tissue marker clip was deployed into the biopsy cavity. Next, a right breast 5 o'clock mass was biopsied. Using sterile technique and 2% Lidocaine as local anesthetic, under direct ultrasound visualization, a 14 gauge spring-loaded device was used to perform biopsy of right breast 5 o'clock mass using a media approach. At the conclusion of the procedure a heart shaped tissue marker clip was deployed into the biopsy cavity. Next, right axillary lymph node was sampled. Using sterile technique and 2% Lidocaine as local anesthetic, under direct ultrasound visualization, a 14 gauge spring-loaded device was used to perform biopsy of right  axilla using a inferior approach. At the conclusion of the procedure a spring shaped tissue marker clip was deployed into the biopsy cavity. Follow up 2 view mammogram was performed and dictated separately. IMPRESSION: Ultrasound guided biopsy of right breast 4 o'clock 2 masses, right breast 5 o'clock mass, and right axillary lymph node. No apparent complications. Electronically Signed: By: Fidela Salisbury M.D. 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 INVASIVE DUCTAL CARCINOMA of the Right breast at the 4 o'clock location, second lesion (coil clip). Grade II INVASIVE DUCTAL CARCINOMA of the Right breast at the 5 o'clock location (heart clip). THERE IS NO EVIDENCE OF CARCINOMA in the Right axillary lymph node (spring clip). This was found to be concordant by Dr. Fidela Salisbury. Pathology results were discussed with the patient via telephone. She reported no problems at the biopsy sites and is doing well. Post biopsy care and instructions were reviewed and questions were answered. The patient was encouraged to call [facility] with any additional questions and or concerns. She was referred to [facility] on August 15, 2015. Pathology results reported by Terie Purser RN on August 07, 2015. Electronically Signed   By: Fidela Salisbury M.D.   On: 08/07/2015 15:09  08/07/2015  CLINICAL DATA:  Three right breast masses with suspicious appearance. EXAM: ULTRASOUND GUIDED RIGHT BREAST CORE NEEDLE BIOPSY COMPARISON:  Previous exam(s). FINDINGS: I met with the patient and we discussed the procedure of ultrasound-guided biopsy, including benefits and alternatives. We  discussed the high likelihood of a successful procedure. We discussed the risks of the procedure, including infection, bleeding, tissue injury, clip migration, and inadequate sampling. Informed written consent was given. The usual time-out protocol was performed immediately prior to the procedure. Initially right breast 4 o'clock, first lesion was biopsied. Using sterile technique and 2% Lidocaine as local anesthetic, under direct ultrasound visualization, a 14 gauge spring-loaded device was used to perform biopsy of right breast 4 o'clock using a media approach. At the conclusion of the procedure a ribbon shaped tissue marker clip was deployed into the biopsy cavity. Next, a right breast 4 o'clock, second lesion was biopsied. Using sterile technique and 2% Lidocaine as  local anesthetic, under direct ultrasound visualization, a 14 gauge spring-loaded device was used to perform biopsy of right breast 4 o'clock using a media approach. At the conclusion of the procedure a coil shaped tissue marker clip was deployed into the biopsy cavity. Next, a right breast 5 o'clock mass was biopsied. Using sterile technique and 2% Lidocaine as local anesthetic, under direct ultrasound visualization, a 14 gauge spring-loaded device was used to perform biopsy of right breast 5 o'clock mass using a media approach. At the conclusion of the procedure a heart shaped tissue marker clip was deployed into the biopsy cavity. Next, right axillary lymph node was sampled. Using sterile technique and 2% Lidocaine as local anesthetic, under direct ultrasound visualization, a 14 gauge spring-loaded device was used to perform biopsy of right axilla using a inferior approach. At the conclusion of the procedure a spring shaped tissue marker clip was deployed into the biopsy cavity. Follow up 2 view mammogram was performed and dictated separately. IMPRESSION: Ultrasound guided biopsy of right breast 4 o'clock 2 masses, right breast 5 o'clock mass, and  right axillary lymph node. No apparent complications. Electronically Signed: By: Fidela Salisbury M.D. On: 08/02/2015 16:36    ASSESSMENT: 55 y.o. [place] woman  (1) definitive surgery pending  (2) Oncotype DX to be sent from the surgical specimen  (3) radiation to follow as appropriate  (4) anti-estrogens to follow radiation.  (5) if the patient's tumor proves to be HER-2 amplified, she will require anti-HER-2 immunotherapy plus or minus chemotherapy.  PLAN: We spent the better part of today's hour-long appointment discussing the biology of breast cancer in general, and the specifics of the patient's tumor in particular. Aloma understands step 1 in her case will be surgery, and this will give [place] a more complete idea of her staging. It will also clarify the HER-2 question, if immunohistochemistry treated does not clarify it from the original biopsy. We will also send an Oncotype from the tumors to be removed and that will help [place] with the chemotherapy decision.  In general in someone like her, with a compromised functional status, I would only proceed to chemotherapy if the Oncotype is high risk. However if it turns out that her cancer is HER-2 positive we will have to consider Abraxane together with anti-HER-2 immunotherapy.  Once she completes local treatment, which likely will include radiation, she will be a good candidate for anti-estrogens. This will likely consist of anastrozole and if she can tolerate that medication we will proceed to aggressive osteoporosis prophylaxis.  Aaminah is a very religious person and she feels she is likely cured already with prayer. I certainly support her faith. Taking antiestrogen and having breast cancer surgery really is no different than having her kinee worked on, as she recently did. These are standard available treatments and she is not putting her faith to the test by accepting them  Brendaas a good understanding of the overall plan. She  agrees with it. She knows the goal of treatment in her case is cure. She will call with any problems that may develop before her next visit here, which will be at least 2 weeks postop so we have results from her Oncotype studies.   Chauncey Cruel, MD   08/15/2015 11:00 AM Medical Oncology and Hematology [facility] [address] [place], [place] [address] Tel. [phone]    Fax. [phone]

## 2015-08-15 NOTE — Progress Notes (Signed)
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  HISTORY OF PRESENT ILLNESS::[name] is a 54 y.o. female who presented with a palpable right breast mass which she discovered in the shower. Her last mammogram was in 2010. Work up showed three masses in the lower inner quadrant with associated calcifications which together measured 11.6 cm. The largest mass measured 1.9 cm. Ultrasound confirmed these findings as well as multiple lymph nodes in the axilla with thickened cortices. The largest lymph nodes measured 0.9 cm. Biopsy of two masses revealed grade 1 invasive ductal carcinoma which was ER/PR positive and HER2 negative. The third mass showed grade 2 invasive ductal carcinoma ER/PR positive and HER2 equivocal. KI67 10%. [place] testing is pending on this sample. Lymph node was biopsied and was benign. She is accompanied by her 2 sisters.  She is   PREVIOUS RADIATION THERAPY: No  Past medical, social and family history were reviewed in the electronic chart. Review of symptoms was reviewed in the electronic chart. Medications were reviewed in the electronic chart.   PHYSICAL EXAM: BP: 154/85  Pulse: 64  Temp: 97.9 F (36.6 C)  Resp: 18   Body mass index is 39.08 kg/(m^2). ECOG FS:2 - Symptomatic, <50% confined to bed       She is a pleasant female who appears older than her stated age. She ambulates poorly with a walker. She has no palpable cervical, supraclavicular or axillary adenopathy. She has 2 palpable masses in the medial right breast. These are mobile and measure about 2 cm. There are no palpable abnromalities of the left breast. She is alert and oriented x 3.   Gynecologic History: She is post menopausal with her  first birth at 65. She is GXP3. She is s/p hysterectomy and BXO and never used HRT>   IMPRESSION: 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 She understands that this is for cosmetic reasons not due to the "aggressiveness" of her cancer. I spoke to the patient today regarding her diagnosis and options for treatment. We discussed the equivalence in terms of survival and local failure between mastectomy and breast conservation. We discussed the role of radiation in decreasing local failures in patients who undergo mastectomy and have risk factors for recurrence including positive lymph nodes and/or tumors over 5 cm and/or positive margins (none of which she has). We discussed that this information could change at the time of her surgery and that we would discuss radiation further if this occurred.   We discussed the process of simulation and the placement tattoos. We discussed 6 weeks of treatment as an outpatient. We discussed the possibility of asymptomatic lung damage. We discussed the low likelihood of secondary malignancies. We discussed the possible side effects including but not limited to skin redness, fatigue, permanent skin darkening, and chest wall swelling. We discussed increased complications that can occur with reconstruction after radiation.   We await her HER2 results and if these are negative, an Oncotype, to determine her need for chemotherapy.   She also met with surgery, medical oncology as  well as a member of our patient family support team, a dietitian, survivorship nurse practitioner, breast cancer navigator, and our physical therapist.  I spent 30 minutes  face to face with the patient and more than 50% of that time was spent in counseling and/or coordination of care.     ------------------------------------------------  Thea Silversmith, MD    This document serves as a record of services personally performed by Thea Silversmith, MD. It was created on her behalf by  Lendon Collar, a trained medical scribe. The creation of this record is based on the scribe's personal observations and the provider's statements to them. This document has been checked and approved by the attending provider.

## 2015-08-15 NOTE — Therapy (Signed)
[place], [place], [address] Phone: [phone]   Fax:  [phone]  Physical Therapy Evaluation  Patient Details  Name: [name] MRN: [id number] Date of Birth: 08/09/1961 Referring Provider: Dr. Autumn Messing  Encounter Date: 08/15/2015      PT End of Session - 08/15/15 1024    Visit Number 1   Number of Visits 1   PT Start Time 0953   PT Stop Time 1016   PT Time Calculation (min) 23 min   Activity Tolerance Patient tolerated treatment well   Behavior During Therapy [facility] for tasks assessed/performed      Past Medical History  Diagnosis Date  . Osteoarthritis   . Headache(784.0)     migraine like per patient  . Hypertension   . Low back pain     dr phillips, pain management  . Degenerative joint disease   . Gallstones   . GERD (gastroesophageal reflux disease)   . DDD (degenerative disc disease), cervical   . Colon polyps   . Diverticulosis   . Renal disorder   . Diabetes mellitus without complication ([place])   . Breast cancer of lower-inner quadrant of right female breast ([place]) 08/08/2015  . Breast cancer [facility])     Past Surgical History  Procedure Laterality Date  . Replacement total knee Right     right - dr Novella Olive  . Ganglion cyst excision Right     right wrist  . Lipoma excision Right 02/21/08    right hip, [facility]  . Replacement total knee Left 06/25/09    left - dr Marciano Sequin [facility]  . Tonsillectomy    . Joint replacement      both knees have been done, four times each per patient. Her body rejects the implant, no infection.  . Abdominal hysterectomy  06/22/06    lap, dr Mancel Bale  . Other surgical history      lipoma removed from back 2012   . Cholecystectomy  10/28/2011    Procedure: LAPAROSCOPIC CHOLECYSTECTOMY WITH INTRAOPERATIVE CHOLANGIOGRAM;  Surgeon: Earnstine Regal, MD;  Location: [facility];  Service: General;  Laterality: N/A;  . Mass excision  08/24/2012    Procedure:  EXCISION MASS;  Surgeon: Earnstine Regal, MD;  Location: [place];  Service: General;  Laterality: Left;  excisie soft tissue masses left shoulder(back) & left upper arm  . Colonoscopy  02-03-13    per Dr. Henrene Pastor, diverticuli but no polyps, repeat in 5 yrs   . Mass excision Left 01/24/2014    Procedure: EXCISION SOFT TISSUE MASSES LOWER LEFT BACK;  Surgeon: Earnstine Regal, MD;  Location: [place];  Service: General;  Laterality: Left;  . Esophagogastroduodenoscopy  02-03-13    per Dr. Henrene Pastor, normal     There were no vitals filed for this visit.  Visit Diagnosis:  Carcinoma of lower-inner quadrant of right breast ([place]) - Plan: PT plan of care cert/re-cert  Abnormal posture - Plan: PT plan of care cert/re-cert      Subjective Assessment - 08/15/15 1015    Subjective Patient was seen today for a baseline assessment of her newly diagnosed right breast cancer.   Patient is accompained by: Family member   Pertinent History Patient was diagnosed on 07/30/15 with right invasive ductal carcinoma breast cancer.  She has 3 masses measuring 1.7, 1.3, and 1.1 cm located in the right lower inner quadrant.  It is grade 2-3, ER/PR positive and HER2 equivocal  with a Ki67 of 10%.   Patient Stated Goals Reduce lymphedema risk and learn post op shoulder ROM HEP   Currently in Pain? Yes   Pain Score 4    Pain Location Knee   Pain Orientation Right   Pain Descriptors / Indicators Aching   Pain Type Surgical pain   Pain Onset 1 to 4 weeks ago   Pain Frequency Constant   Aggravating Factors  weightbearing   Pain Relieving Factors ice and elevation   Effect of Pain on Daily Activities limits walking            [facility] PT Assessment - 08/15/15 0001    Assessment   Medical Diagnosis Right breast cancer   Referring Provider Dr. Autumn Messing   Onset Date/Surgical Date 07/30/15   Hand Dominance Right   Prior Therapy PT for right knee replacement; begins outpatient PT on 08/20/15    Precautions   Precautions Fall;Other (comment)  Recent right TKR and active right breast cancer   Restrictions   Weight Bearing Restrictions No   Balance Screen   Has the patient fallen in the past 6 months No   Has the patient had a decrease in activity level because of a fear of falling?  No   Is the patient reluctant to leave their home because of a fear of falling?  No   Home Environment   Living Environment Private residence   Living Arrangements Spouse/significant other   Available Help at Discharge Family   Prior Function   Level of Independence Requires assistive device for independence  Ambulating with rolling walker due to recent TKR   Vocation On disability   Leisure She was walking 20-30 min/day before TKR in 10/16   Cognition   Overall Cognitive Status Within Functional Limits for tasks assessed   Posture/Postural Control   Posture/Postural Control Postural limitations   Postural Limitations Rounded Shoulders;Forward head   ROM / Strength   AROM / PROM / Strength AROM;Strength   AROM   AROM Assessment Site Shoulder   Right/Left Shoulder Right;Left   Right Shoulder Extension 67 Degrees   Right Shoulder Flexion 155 Degrees   Right Shoulder ABduction 150 Degrees   Right Shoulder Internal Rotation 64 Degrees   Right Shoulder External Rotation 80 Degrees   Left Shoulder Extension 55 Degrees   Left Shoulder Flexion 164 Degrees   Left Shoulder ABduction 146 Degrees   Left Shoulder Internal Rotation 65 Degrees   Left Shoulder External Rotation 86 Degrees   Strength   Overall Strength Within functional limits for tasks performed           LYMPHEDEMA/ONCOLOGY QUESTIONNAIRE - 08/15/15 1022    Type   Cancer Type Right breast cancer   Lymphedema Assessments   Lymphedema Assessments Upper extremities   Right Upper Extremity Lymphedema   10 cm Proximal to Olecranon Process 35.8 cm   Olecranon Process 29 cm   10 cm Proximal to Ulnar Styloid Process 26 cm   Just  Proximal to Ulnar Styloid Process 20.3 cm   Across Hand at [employer] 22.3 cm   At [place] of 2nd Digit 7.6 cm   Left Upper Extremity Lymphedema   10 cm Proximal to Olecranon Process 37.6 cm   Olecranon Process 30.7 cm   10 cm Proximal to Ulnar Styloid Process 26.2 cm   Just Proximal to Ulnar Styloid Process 20.3 cm   Across Hand at [employer] 22.3 cm   At [place] of 2nd Digit  7.7 cm     Patient was instructed today in a home exercise program today for post op shoulder range of motion. These included active assist shoulder flexion in sitting, scapular retraction, wall walking with shoulder abduction, and hands behind head external rotation.  She was encouraged to do these twice a day, holding 3 seconds and repeating 5 times when permitted by her physician.          PT Education - 08/15/15 1024    Education provided Yes   Education Details Lymphedema risk reduction and post op shoulder ROM HEP   Person(s) Educated Patient   Methods Explanation;Demonstration;Handout   Comprehension Verbalized understanding;Returned demonstration              Breast Clinic Goals - 08/15/15 1105    Patient will be able to verbalize understanding of pertinent lymphedema risk reduction practices relevant to her diagnosis specifically related to skin care.   Time 1   Period Days   Status Achieved   Patient will be able to return demonstrate and/or verbalize understanding of the post-op home exercise program related to regaining shoulder range of motion.   Time 1   Period Days   Status Achieved   Patient will be able to verbalize understanding of the importance of attending the postoperative After Breast Cancer Class for further lymphedema risk reduction education and therapeutic exercise.   Time 1   Period Days   Status Achieved              Plan - 08/15/15 1025    Clinical Impression Statement Patient was diagnosed on 07/30/15 with right invasive ductal carcinoma breast cancer.   She has 3 masses measuring 1.7, 1.3, and 1.1 cm located in the right lower inner quadrant.  It is grade 2-3, ER/PR positive and HER2 equivocal with a Ki67 of 10%.  She is planning to have a right mastectomy and sentinel node biopsy followed by Oncotype testing and anti-estrogen therapy.  She will benefit from post op PT to regain shoulder ROM and reduce lymphedema risk.   Pt will benefit from skilled therapeutic intervention in order to improve on the following deficits Decreased strength;Increased edema;Pain;Impaired UE functional use;Decreased range of motion;Decreased knowledge of precautions   Rehab Potential Excellent   Clinical Impairments Affecting Rehab Potential none   PT Frequency One time visit   PT Treatment/Interventions Therapeutic exercise;Patient/family education   Consulted and Agree with Plan of Care Patient;Family member/caregiver   Family Member Consulted various family members     Patient will follow up at outpatient cancer rehab if needed following surgery.  If the patient requires physical therapy at that time, a specific plan will be dictated and sent to the referring physician for approval. The patient was educated today on appropriate basic range of motion exercises to begin post operatively and the importance of attending the After Breast Cancer class following surgery.  Patient was educated today on lymphedema risk reduction practices as it pertains to recommendations that will benefit the patient immediately following surgery.  She verbalized good understanding.  No additional physical therapy is indicated at this time.       Problem List Patient Active Problem List   Diagnosis Date Noted  . 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 Diabetes mellitus without complication ([place]) 87/56/4332  . Paraspinous mass, left 03/31/2013  . Internal hemorrhoid 02/17/2013  . Abdominal pain, other specified site 02/17/2013  . Neoplasm of  soft tissue, left shoulder  and left upper arm 08/03/2012  . Lightheadedness 01/29/2012  . Cholelithiasis with cholecystitis 10/08/2011  . Hypokalemia 09/17/2011  . GERD 08/08/2010  . GOUT 12/18/2008  . HYPERTENSION 03/15/2007  . HEADACHE 03/15/2007    Annia Friendly, PT 08/15/2015 11:09 AM  [place], [place], [address] Phone: [phone]   Fax:  [phone]  Name: [name] MRN: [id number] Date of Birth: 03/16/1961

## 2015-08-15 NOTE — Progress Notes (Signed)
Subjective:     Patient ID: [name], female   DOB: 19-Jan-1961, 54 y.o.   MRN: [id number]  HPI   Review of Systems     Objective:   Physical Exam For the patient to understand and be given the tools to implement a healthy plant based diet during their cancer diagnosis.     Assessment:     Patient was seen today and found to be in good spirits and accompanied by her family. Pts medications: omeprezole, furosemide, lisinopril, methadone, and oxycodone. Pt does have diabetes and she states her A1C is 6. Pt states she walks twice a day most days of the week. Pts ht 5'7'', BMI 39.2, and wt 249 pounds. Pt states her doctor wants her to lose weight but she does not want to be a "slipper spoon". Pts labs: K 3.1     Plan:     Dietitian educated the patient on implementing a plant based diet by incorporating more plant proteins, fruits, and vegetables. As a part of a healthy routine physical activity was discussed. Dietitian advised the pt to focus on health and not weight.  The importance of legitimate, evidence based information was discussed and examples were given. A folder of evidence based information with a focus on a plant based diet and general nutrition during cancer was given to the patient.  As a part of the continuum of care the cancer dietitian's contact information was given to the patient in the event they would like to have a follow up appointment.

## 2015-08-15 NOTE — Progress Notes (Signed)
[name] is a very pleasant 54 y.o. female from [place], [place] with newly diagnosed grade 1-2 invasive ductal carcinoma of the right breast.  Biopsy results of two separate lesions revealed the tumor's prognostic profile is ER positive, PR positive, and HER2/neu negative (1 of 2 biopsies, other biopsy is equivocal). Ki67 is 5 and 10% for the two areas  She presents today with her 3 sisters to the [facility]) for treatment consideration and recommendations from the breast surgeon, radiation oncologist, and medical oncologist.     I briefly met with [name] and her sisters during her [facility] visit today. We discussed the purpose of the Survivorship Clinic, which will include monitoring for recurrence, coordinating completion of age and gender-appropriate cancer screenings, promotion of overall wellness, as well as managing potential late/long-term side effects of anti-cancer treatments.    The treatment plan for [name] will likely include surgery (mastectomy) and anti-estrogen therapy.  As of today, the intent of treatment for [name] is cure, therefore she will be eligible for the Survivorship Clinic upon her completion of treatment.  Her survivorship care plan (SCP) document will be drafted and updated throughout the course of her treatment trajectory. She will receive the SCP in an office visit with myself in the Survivorship Clinic once she has completed treatment.   [name] was encouraged to ask questions and all questions were answered to her satisfaction.  She was given my business card and encouraged to contact me with any concerns regarding survivorship.  I look forward to participating in her care.   Kenn File, [place] [phone]

## 2015-08-15 NOTE — Patient Instructions (Signed)

## 2015-08-17 ENCOUNTER — Telehealth: Payer: Self-pay | Admitting: Oncology

## 2015-08-17 NOTE — Telephone Encounter (Signed)
Patient aware of 09/28/15 appointment

## 2015-08-20 ENCOUNTER — Telehealth: Payer: Self-pay | Admitting: Family Medicine

## 2015-08-20 ENCOUNTER — Telehealth: Payer: Self-pay | Admitting: *Deleted

## 2015-08-20 NOTE — Telephone Encounter (Signed)
Spoke to pt concerning [place] from 08/15/15. Denies questions or concerns regarding dx or treatment care plan. Encourage pt to call with needs. Received verbal understanding. Contact information provided.

## 2015-08-20 NOTE — Telephone Encounter (Signed)
[name] from [place] call to say that pt bp is running high  This is after she has taken her bp meds  [place] phone number [phone]   150/82 08/20/15 160/88 11/25 16 172/94 08/10/15 140/80 08/07/15 146/100 07/31/15

## 2015-08-20 NOTE — Telephone Encounter (Signed)
Tell her to increase the Amlodipine to 2 tablets a day (total of 10 mg) and see [place] in one week

## 2015-08-20 NOTE — Telephone Encounter (Signed)
I spoke with pt and she is already taking Amlodipine 10 mg twice a day.

## 2015-08-21 NOTE — Telephone Encounter (Signed)
Increase the Lisinopril to 2 tabs daily (total of 20 mg)

## 2015-08-21 NOTE — Telephone Encounter (Signed)
I spoke with pt and updated medication list. 

## 2015-08-31 ENCOUNTER — Other Ambulatory Visit: Payer: Self-pay | Admitting: General Surgery

## 2015-08-31 DIAGNOSIS — C50511 Malignant neoplasm of lower-outer quadrant of right female breast: Secondary | ICD-10-CM

## 2015-09-07 ENCOUNTER — Other Ambulatory Visit: Payer: Self-pay | Admitting: Family Medicine

## 2015-09-07 ENCOUNTER — Encounter: Payer: Self-pay | Admitting: *Deleted

## 2015-09-07 ENCOUNTER — Telehealth: Payer: Self-pay | Admitting: Oncology

## 2015-09-07 NOTE — Telephone Encounter (Signed)
s.w. pt and cx Jan appt and moved to 2.1.17.Marland KitchenMarland KitchenMarland Kitchenpt ok and aware

## 2015-09-10 NOTE — Telephone Encounter (Signed)
Call in #[phone] with one rf 

## 2015-09-11 ENCOUNTER — Telehealth: Payer: Self-pay | Admitting: Family Medicine

## 2015-09-11 ENCOUNTER — Telehealth: Payer: Self-pay | Admitting: *Deleted

## 2015-09-11 NOTE — Telephone Encounter (Addendum)
Pt would like to know if its time for her to have a1c check

## 2015-09-11 NOTE — Telephone Encounter (Signed)
-----   Message from Mauro Kaufmann, RN sent at 09/07/2015  7:03 AM EST ----- Regarding: [place] Team,  Ms Bania was seen in clinic on 11/23. She is scheduled for the following.  Surgery - 1/9 F/u with Dr. Jana Hakim 2/1  We will send an oncotype pending her final pathology. Please let me know if you have questions. Thanks, [employer]

## 2015-09-12 ENCOUNTER — Other Ambulatory Visit: Payer: Self-pay | Admitting: Family Medicine

## 2015-09-12 DIAGNOSIS — E119 Type 2 diabetes mellitus without complications: Secondary | ICD-10-CM

## 2015-09-12 NOTE — Telephone Encounter (Signed)
Order is in for the A1C can you please schedule her for lab thanks

## 2015-09-12 NOTE — Telephone Encounter (Signed)
Pt has been sch

## 2015-09-12 NOTE — Telephone Encounter (Signed)
Yes she can schedule this anytime

## 2015-09-19 ENCOUNTER — Other Ambulatory Visit: Payer: 59

## 2015-09-20 ENCOUNTER — Other Ambulatory Visit (INDEPENDENT_AMBULATORY_CARE_PROVIDER_SITE_OTHER): Payer: 59

## 2015-09-20 DIAGNOSIS — E119 Type 2 diabetes mellitus without complications: Secondary | ICD-10-CM

## 2015-09-20 LAB — HEMOGLOBIN A1C: Hgb A1c MFr Bld: 6.2 % (ref 4.6–6.5)

## 2015-09-23 DIAGNOSIS — Z9221 Personal history of antineoplastic chemotherapy: Secondary | ICD-10-CM

## 2015-09-23 DIAGNOSIS — C50919 Malignant neoplasm of unspecified site of unspecified female breast: Secondary | ICD-10-CM

## 2015-09-23 DIAGNOSIS — Z923 Personal history of irradiation: Secondary | ICD-10-CM

## 2015-09-23 HISTORY — DX: Personal history of irradiation: Z92.3

## 2015-09-23 HISTORY — DX: Personal history of antineoplastic chemotherapy: Z92.21

## 2015-09-23 HISTORY — DX: Malignant neoplasm of unspecified site of unspecified female breast: C50.919

## 2015-09-23 HISTORY — PX: MASTECTOMY: SHX3

## 2015-09-25 ENCOUNTER — Encounter (HOSPITAL_COMMUNITY): Payer: Self-pay

## 2015-09-25 ENCOUNTER — Encounter (HOSPITAL_COMMUNITY)
Admission: RE | Admit: 2015-09-25 | Discharge: 2015-09-25 | Disposition: A | Discharge status: Home / Self Care | Payer: 59 | Source: Ambulatory Visit | Attending: General Surgery | Admitting: General Surgery

## 2015-09-25 DIAGNOSIS — Z01812 Encounter for preprocedural laboratory examination: Secondary | ICD-10-CM | POA: Insufficient documentation

## 2015-09-25 DIAGNOSIS — C50911 Malignant neoplasm of unspecified site of right female breast: Secondary | ICD-10-CM | POA: Insufficient documentation

## 2015-09-25 LAB — BASIC METABOLIC PANEL
Anion gap: 10 (ref 5–15)
BUN: 10 mg/dL (ref 6–20)
CO2: 28 mmol/L (ref 22–32)
Calcium: 9.5 mg/dL (ref 8.9–10.3)
Chloride: 103 mmol/L (ref 101–111)
Creatinine, Ser: 0.85 mg/dL (ref 0.44–1.00)
GFR calc Af Amer: >60 mL/min (ref >60)
GFR calc non Af Amer: >60 mL/min (ref >60)
Glucose, Bld: 91 mg/dL (ref 65–99)
Potassium: 3.6 mmol/L (ref 3.5–5.1)
Sodium: 141 mmol/L (ref 135–145)

## 2015-09-25 LAB — CBC
HCT: 38.9 % (ref 36.0–46.0)
Hemoglobin: 12.5 g/dL (ref 12.0–15.0)
MCH: 28 pg (ref 26.0–34.0)
MCHC: 32.1 g/dL (ref 30.0–36.0)
MCV: 87.2 fL (ref 78.0–100.0)
Platelets: 305 10*3/uL (ref 150–400)
RBC: 4.46 MIL/uL (ref 3.87–5.11)
RDW: 13.5 % (ref 11.5–15.5)
WBC: 7.7 10*3/uL (ref 4.0–10.5)

## 2015-09-25 LAB — GLUCOSE, CAPILLARY: Glucose-Capillary: 91 mg/dL (ref 65–99)

## 2015-09-25 NOTE — Pre-Procedure Instructions (Signed)
[name]  09/25/2015      [facility] - [place], [place] - [address] [place] [address] Phone: [phone] Fax: [phone]    Your procedure is scheduled on Monday, January 9th   Report to [place] at 9:15 AM             (Surgerical time posted from 11:15 am to 1:47 pm)   Call this number if you have problems the morning of surgery:  [phone]   Remember:  Do not eat food or drink liquids after midnight Sunday.   Take these medicines the morning of surgery with A SIP OF WATER : Norvasc, Lopressor, Omeprazole, Pain medication             DO NOT take your diabetes medication that morning.  Please stop taking any herbal supplements, vitamins, blood thinners 4-5 days prior to surgery!   Do not wear jewelry, make-up or nail polish.  Do not wear lotions, powders, or perfumes.  You may NOT wear deodorant the day of surgery.  Do not shave underarms & legs 48 hours prior to surgery.     Do not bring valuables to the hospital.  [facility] is not responsible for any belongings or valuables.  Contacts, dentures or bridgework may not be worn into surgery.  Leave your suitcase in the car.  After surgery it may be brought to your room. For patients admitted to the hospital, discharge time will be determined by your treatment team.   Name and phone number of your driver:      Please read over the following fact sheets that you were given. Pain Booklet, Coughing and Deep Breathing and Surgical Site Infection Prevention

## 2015-09-25 NOTE — Progress Notes (Addendum)
In 2000, blood pressure was up, having heaviness in chest.  Saw Dr. Stanford Breed who did cath-- "came back negative" and hasn't been back since. She does take methadone for her arthritic joints and pain.  Has Oxy for break through pain. (See Dr. Nicholaus Bloom for pain management - across from [facility]) Patient checks blood sugars in the AM, "never gets above 120, not sure why I have to take medication".

## 2015-09-28 ENCOUNTER — Ambulatory Visit: Payer: 59 | Admitting: Oncology

## 2015-09-28 ENCOUNTER — Other Ambulatory Visit: Payer: 59

## 2015-09-30 MED ORDER — CEFAZOLIN SODIUM-DEXTROSE 2-3 GM-% IV SOLR
2.0000 g | INTRAVENOUS | Status: AC
  Administered 2015-10-01: 2 g via INTRAVENOUS
  Filled 2015-09-30: qty 50

## 2015-09-30 MED ORDER — CHLORHEXIDINE GLUCONATE 4 % EX LIQD: 1.0000 "application " | Freq: Once | CUTANEOUS | Status: DC

## 2015-10-01 ENCOUNTER — Ambulatory Visit (HOSPITAL_COMMUNITY): Payer: 59 | Admitting: Certified Registered Nurse Anesthetist

## 2015-10-01 ENCOUNTER — Encounter (HOSPITAL_COMMUNITY)
Admission: RE | Disposition: A | Discharge status: Home / Self Care | Payer: Self-pay | Source: Ambulatory Visit | Attending: General Surgery

## 2015-10-01 ENCOUNTER — Ambulatory Visit (HOSPITAL_COMMUNITY)
Admission: RE | Admit: 2015-10-01 | Discharge: 2015-10-02 | Disposition: A | Discharge status: Home / Self Care | Payer: 59 | Source: Ambulatory Visit | Attending: General Surgery | Admitting: General Surgery

## 2015-10-01 ENCOUNTER — Encounter (HOSPITAL_COMMUNITY): Payer: Self-pay | Admitting: *Deleted

## 2015-10-01 ENCOUNTER — Encounter (HOSPITAL_COMMUNITY)
Admission: RE | Admit: 2015-10-01 | Discharge: 2015-10-01 | Disposition: A | Discharge status: Home / Self Care | Payer: 59 | Source: Ambulatory Visit | Attending: General Surgery | Admitting: General Surgery

## 2015-10-01 DIAGNOSIS — C50511 Malignant neoplasm of lower-outer quadrant of right female breast: Secondary | ICD-10-CM

## 2015-10-01 DIAGNOSIS — I10 Essential (primary) hypertension: Secondary | ICD-10-CM | POA: Insufficient documentation

## 2015-10-01 DIAGNOSIS — Z90710 Acquired absence of both cervix and uterus: Secondary | ICD-10-CM | POA: Insufficient documentation

## 2015-10-01 DIAGNOSIS — Z79899 Other long term (current) drug therapy: Secondary | ICD-10-CM | POA: Diagnosis not present

## 2015-10-01 DIAGNOSIS — Z7984 Long term (current) use of oral hypoglycemic drugs: Secondary | ICD-10-CM | POA: Insufficient documentation

## 2015-10-01 DIAGNOSIS — C50911 Malignant neoplasm of unspecified site of right female breast: Secondary | ICD-10-CM | POA: Diagnosis present

## 2015-10-01 DIAGNOSIS — E119 Type 2 diabetes mellitus without complications: Secondary | ICD-10-CM | POA: Diagnosis not present

## 2015-10-01 DIAGNOSIS — Z170 Estrogen receptor positive status [ER+]: Secondary | ICD-10-CM | POA: Diagnosis present

## 2015-10-01 DIAGNOSIS — Z87891 Personal history of nicotine dependence: Secondary | ICD-10-CM | POA: Diagnosis not present

## 2015-10-01 DIAGNOSIS — C50311 Malignant neoplasm of lower-inner quadrant of right female breast: Secondary | ICD-10-CM | POA: Diagnosis present

## 2015-10-01 HISTORY — PX: MASTECTOMY W/ SENTINEL NODE BIOPSY: SHX2001

## 2015-10-01 LAB — GLUCOSE, CAPILLARY
Glucose-Capillary: 119 mg/dL — ABNORMAL HIGH (ref 65–99)
Glucose-Capillary: 109 mg/dL — ABNORMAL HIGH (ref 65–99)

## 2015-10-01 SURGERY — MASTECTOMY WITH SENTINEL LYMPH NODE BIOPSY
Anesthesia: General | Site: Breast | Laterality: Right

## 2015-10-01 MED ORDER — ONDANSETRON HCL 4 MG/2ML IJ SOLN
INTRAMUSCULAR | Status: DC | PRN
  Administered 2015-10-01: 4 mg via INTRAVENOUS

## 2015-10-01 MED ORDER — PANTOPRAZOLE SODIUM 40 MG IV SOLR
40.0000 mg | Freq: Every day | INTRAVENOUS | Status: DC
  Administered 2015-10-01: 40 mg via INTRAVENOUS
  Filled 2015-10-01: qty 40

## 2015-10-01 MED ORDER — EPHEDRINE SULFATE 50 MG/ML IJ SOLN
INTRAMUSCULAR | Status: DC | PRN
  Administered 2015-10-01: 10 mg via INTRAVENOUS

## 2015-10-01 MED ORDER — GLYCOPYRROLATE 0.2 MG/ML IJ SOLN
INTRAMUSCULAR | Status: DC | PRN
  Administered 2015-10-01: 0.4 mg via INTRAVENOUS

## 2015-10-01 MED ORDER — FENTANYL CITRATE (PF) 100 MCG/2ML IJ SOLN
INTRAMUSCULAR | Status: DC | PRN
  Administered 2015-10-01: 100 ug via INTRAVENOUS
  Administered 2015-10-01: 50 ug via INTRAVENOUS
  Administered 2015-10-01: 100 ug via INTRAVENOUS

## 2015-10-01 MED ORDER — ONDANSETRON 4 MG PO TBDP: 4.0000 mg | ORAL_TABLET | Freq: Four times a day (QID) | ORAL | Status: DC | PRN

## 2015-10-01 MED ORDER — MIDAZOLAM HCL 2 MG/2ML IJ SOLN
INTRAMUSCULAR | Status: AC
  Filled 2015-10-01: qty 2

## 2015-10-01 MED ORDER — FUROSEMIDE 20 MG PO TABS
20.0000 mg | ORAL_TABLET | Freq: Every day | ORAL | Status: DC
  Administered 2015-10-02: 20 mg via ORAL
  Filled 2015-10-01: qty 1

## 2015-10-01 MED ORDER — MIDAZOLAM HCL 5 MG/5ML IJ SOLN
INTRAMUSCULAR | Status: DC | PRN
  Administered 2015-10-01 (×2): 1 mg via INTRAVENOUS

## 2015-10-01 MED ORDER — ROCURONIUM BROMIDE 100 MG/10ML IV SOLN
INTRAVENOUS | Status: DC | PRN
  Administered 2015-10-01: 35 mg via INTRAVENOUS

## 2015-10-01 MED ORDER — LIDOCAINE HCL (CARDIAC) 20 MG/ML IV SOLN
INTRAVENOUS | Status: DC | PRN
  Administered 2015-10-01: 50 mg via INTRAVENOUS
  Administered 2015-10-01: 50 mg via INTRATRACHEAL

## 2015-10-01 MED ORDER — MIDAZOLAM HCL 2 MG/2ML IJ SOLN
INTRAMUSCULAR | Status: AC
  Administered 2015-10-01: 2 mg via INTRAVENOUS
  Filled 2015-10-01: qty 2

## 2015-10-01 MED ORDER — POTASSIUM CHLORIDE IN NACL 20-0.9 MEQ/L-% IV SOLN
INTRAVENOUS | Status: DC
  Administered 2015-10-01: 17:00:00 via INTRAVENOUS
  Filled 2015-10-01 (×3): qty 1000

## 2015-10-01 MED ORDER — DEXAMETHASONE SODIUM PHOSPHATE 4 MG/ML IJ SOLN
INTRAMUSCULAR | Status: AC
  Filled 2015-10-01: qty 3

## 2015-10-01 MED ORDER — TECHNETIUM TC 99M SULFUR COLLOID FILTERED
1.0000 | Freq: Once | INTRAVENOUS | Status: AC | PRN
  Administered 2015-10-01: 1 via INTRADERMAL

## 2015-10-01 MED ORDER — PROMETHAZINE HCL 25 MG/ML IJ SOLN: 6.2500 mg | INTRAMUSCULAR | Status: DC | PRN

## 2015-10-01 MED ORDER — PROPOFOL 10 MG/ML IV BOLUS
INTRAVENOUS | Status: AC
  Filled 2015-10-01: qty 20

## 2015-10-01 MED ORDER — PHENYLEPHRINE HCL 10 MG/ML IJ SOLN
10.0000 mg | INTRAMUSCULAR | Status: DC | PRN
  Administered 2015-10-01: 20 ug/min via INTRAVENOUS

## 2015-10-01 MED ORDER — METOPROLOL TARTRATE 100 MG PO TABS
100.0000 mg | ORAL_TABLET | Freq: Two times a day (BID) | ORAL | Status: DC
  Administered 2015-10-01 – 2015-10-02 (×2): 100 mg via ORAL
  Filled 2015-10-01 (×2): qty 1

## 2015-10-01 MED ORDER — HYDROMORPHONE HCL 1 MG/ML IJ SOLN
INTRAMUSCULAR | Status: AC
  Filled 2015-10-01: qty 1

## 2015-10-01 MED ORDER — NEOSTIGMINE METHYLSULFATE 10 MG/10ML IV SOLN
INTRAVENOUS | Status: DC | PRN
  Administered 2015-10-01: 2 mg via INTRAVENOUS

## 2015-10-01 MED ORDER — LIDOCAINE HCL (CARDIAC) 20 MG/ML IV SOLN
INTRAVENOUS | Status: AC
  Filled 2015-10-01: qty 10

## 2015-10-01 MED ORDER — SODIUM CHLORIDE 0.9 % IJ SOLN
INTRAMUSCULAR | Status: AC
  Filled 2015-10-01: qty 10

## 2015-10-01 MED ORDER — ONDANSETRON HCL 4 MG/2ML IJ SOLN
4.0000 mg | Freq: Four times a day (QID) | INTRAMUSCULAR | Status: DC | PRN
  Administered 2015-10-01: 4 mg via INTRAVENOUS
  Filled 2015-10-01: qty 2

## 2015-10-01 MED ORDER — FENTANYL CITRATE (PF) 100 MCG/2ML IJ SOLN
25.0000 ug | INTRAMUSCULAR | Status: DC | PRN
  Administered 2015-10-01: 50 ug via INTRAVENOUS
  Filled 2015-10-01: qty 2

## 2015-10-01 MED ORDER — PROPOFOL 10 MG/ML IV BOLUS
INTRAVENOUS | Status: DC | PRN
  Administered 2015-10-01: 140 mg via INTRAVENOUS

## 2015-10-01 MED ORDER — TEMAZEPAM 15 MG PO CAPS: 30.0000 mg | ORAL_CAPSULE | Freq: Every evening | ORAL | Status: DC | PRN

## 2015-10-01 MED ORDER — FENTANYL CITRATE (PF) 100 MCG/2ML IJ SOLN
INTRAMUSCULAR | Status: AC
  Administered 2015-10-01: 100 ug via INTRAVENOUS
  Filled 2015-10-01: qty 2

## 2015-10-01 MED ORDER — HYDROMORPHONE HCL 1 MG/ML IJ SOLN
0.2500 mg | INTRAMUSCULAR | Status: DC | PRN
  Administered 2015-10-01 (×2): 0.5 mg via INTRAVENOUS

## 2015-10-01 MED ORDER — POTASSIUM CHLORIDE 20 MEQ/15ML (10%) PO SOLN
30.0000 meq | Freq: Two times a day (BID) | ORAL | Status: DC
  Administered 2015-10-02: 30 meq via ORAL
  Filled 2015-10-01 (×2): qty 30

## 2015-10-01 MED ORDER — PANTOPRAZOLE SODIUM 40 MG PO TBEC: 40.0000 mg | DELAYED_RELEASE_TABLET | Freq: Every day | ORAL | Status: DC

## 2015-10-01 MED ORDER — MEPERIDINE HCL 25 MG/ML IJ SOLN: 6.2500 mg | INTRAMUSCULAR | Status: DC | PRN

## 2015-10-01 MED ORDER — LACTATED RINGERS IV SOLN
INTRAVENOUS | Status: DC
  Administered 2015-10-01: 10:00:00 via INTRAVENOUS

## 2015-10-01 MED ORDER — METHADONE HCL 10 MG PO TABS: 10.0000 mg | ORAL_TABLET | Freq: Three times a day (TID) | ORAL | Status: DC | PRN

## 2015-10-01 MED ORDER — ROCURONIUM BROMIDE 50 MG/5ML IV SOLN
INTRAVENOUS | Status: AC
  Filled 2015-10-01: qty 1

## 2015-10-01 MED ORDER — BUPIVACAINE-EPINEPHRINE (PF) 0.5% -1:200000 IJ SOLN
INTRAMUSCULAR | Status: DC | PRN
  Administered 2015-10-01: 30 mL via PERINEURAL

## 2015-10-01 MED ORDER — HEPARIN SODIUM (PORCINE) 5000 UNIT/ML IJ SOLN
5000.0000 [IU] | Freq: Three times a day (TID) | INTRAMUSCULAR | Status: DC
  Administered 2015-10-02: 5000 [IU] via SUBCUTANEOUS
  Filled 2015-10-01: qty 1

## 2015-10-01 MED ORDER — FENTANYL CITRATE (PF) 100 MCG/2ML IJ SOLN
100.0000 ug | Freq: Once | INTRAMUSCULAR | Status: AC
  Administered 2015-10-01: 100 ug via INTRAVENOUS

## 2015-10-01 MED ORDER — DIPHENHYDRAMINE HCL 50 MG/ML IJ SOLN
INTRAMUSCULAR | Status: AC
  Filled 2015-10-01: qty 1

## 2015-10-01 MED ORDER — ONDANSETRON HCL 4 MG/2ML IJ SOLN
INTRAMUSCULAR | Status: AC
  Filled 2015-10-01: qty 2

## 2015-10-01 MED ORDER — DIPHENHYDRAMINE HCL 50 MG/ML IJ SOLN
INTRAMUSCULAR | Status: DC | PRN
  Administered 2015-10-01: 12.5 mg via INTRAVENOUS

## 2015-10-01 MED ORDER — FENTANYL CITRATE (PF) 250 MCG/5ML IJ SOLN
INTRAMUSCULAR | Status: AC
  Filled 2015-10-01: qty 5

## 2015-10-01 MED ORDER — AMLODIPINE BESYLATE 5 MG PO TABS
5.0000 mg | ORAL_TABLET | Freq: Two times a day (BID) | ORAL | Status: DC
  Administered 2015-10-01 – 2015-10-02 (×2): 5 mg via ORAL
  Filled 2015-10-01 (×2): qty 1

## 2015-10-01 MED ORDER — LISINOPRIL 10 MG PO TABS
10.0000 mg | ORAL_TABLET | Freq: Every day | ORAL | Status: DC
  Administered 2015-10-02: 10 mg via ORAL
  Filled 2015-10-01: qty 1

## 2015-10-01 MED ORDER — 0.9 % SODIUM CHLORIDE (POUR BTL) OPTIME
TOPICAL | Status: DC | PRN
  Administered 2015-10-01 (×2): 1000 mL

## 2015-10-01 MED ORDER — AMITRIPTYLINE HCL 25 MG PO TABS
25.0000 mg | ORAL_TABLET | Freq: Every day | ORAL | Status: DC
  Administered 2015-10-01: 25 mg via ORAL
  Filled 2015-10-01: qty 1

## 2015-10-01 MED ORDER — SODIUM CHLORIDE 0.9 % IJ SOLN
INTRAMUSCULAR | Status: DC | PRN
  Administered 2015-10-01: 5 mL

## 2015-10-01 MED ORDER — METHYLENE BLUE 1 % INJ SOLN
INTRAMUSCULAR | Status: AC
  Filled 2015-10-01: qty 10

## 2015-10-01 MED ORDER — METFORMIN HCL 500 MG PO TABS
500.0000 mg | ORAL_TABLET | Freq: Two times a day (BID) | ORAL | Status: DC
  Administered 2015-10-02: 500 mg via ORAL
  Filled 2015-10-01: qty 1

## 2015-10-01 MED ORDER — ARTIFICIAL TEARS OP OINT
TOPICAL_OINTMENT | OPHTHALMIC | Status: AC
  Filled 2015-10-01: qty 3.5

## 2015-10-01 MED ORDER — OXYCODONE HCL 5 MG PO TABS
15.0000 mg | ORAL_TABLET | ORAL | Status: DC | PRN
  Administered 2015-10-01 – 2015-10-02 (×2): 15 mg via ORAL
  Filled 2015-10-01 (×3): qty 3

## 2015-10-01 MED ORDER — DEXAMETHASONE SODIUM PHOSPHATE 4 MG/ML IJ SOLN
INTRAMUSCULAR | Status: DC | PRN
  Administered 2015-10-01: 8 mg via INTRAVENOUS

## 2015-10-01 MED ORDER — MIDAZOLAM HCL 2 MG/2ML IJ SOLN
2.0000 mg | Freq: Once | INTRAMUSCULAR | Status: AC
  Administered 2015-10-01: 2 mg via INTRAVENOUS

## 2015-10-01 SURGICAL SUPPLY — 60 items
APPLIER CLIP 9.375 MED OPEN (MISCELLANEOUS) ×9
APR CLP MED 9.3 20 MLT OPN (MISCELLANEOUS) ×3
BINDER BREAST 3XL (BIND) ×2 IMPLANT
BINDER BREAST LRG (GAUZE/BANDAGES/DRESSINGS) IMPLANT
BINDER BREAST XLRG (GAUZE/BANDAGES/DRESSINGS) IMPLANT
BIOPATCH RED 1 DISK 7.0 (GAUZE/BANDAGES/DRESSINGS) ×2 IMPLANT
BIOPATCH RED 1IN DISK 7.0MM (GAUZE/BANDAGES/DRESSINGS) ×2
CANISTER SUCTION 2500CC (MISCELLANEOUS) ×6 IMPLANT
CHLORAPREP W/TINT 26ML (MISCELLANEOUS) ×3 IMPLANT
CLIP APPLIE 9.375 MED OPEN (MISCELLANEOUS) ×1 IMPLANT
CONT SPEC 4OZ CLIKSEAL STRL BL (MISCELLANEOUS) ×5 IMPLANT
COVER PROBE W GEL 5X96 (DRAPES) ×3 IMPLANT
COVER SURGICAL LIGHT HANDLE (MISCELLANEOUS) ×3 IMPLANT
DEVICE DISSECT PLASMABLAD 3.0S (MISCELLANEOUS) ×1 IMPLANT
DRAIN CHANNEL 19F RND (DRAIN) ×5 IMPLANT
DRAPE LAPAROSCOPIC ABDOMINAL (DRAPES) ×3 IMPLANT
DRAPE UTILITY XL STRL (DRAPES) ×2 IMPLANT
DRSG PAD ABDOMINAL 8X10 ST (GAUZE/BANDAGES/DRESSINGS) ×5 IMPLANT
DRSG TEGADERM 4X4.75 (GAUZE/BANDAGES/DRESSINGS) ×4 IMPLANT
ELECT CAUTERY BLADE 6.4 (BLADE) ×3 IMPLANT
ELECT REM PT RETURN 9FT ADLT (ELECTROSURGICAL) ×3
ELECTRODE REM PT RTRN 9FT ADLT (ELECTROSURGICAL) ×1 IMPLANT
EVACUATOR SILICONE 100CC (DRAIN) ×5 IMPLANT
GAUZE SPONGE 4X4 12PLY STRL (GAUZE/BANDAGES/DRESSINGS) ×3 IMPLANT
GAUZE XEROFORM 5X9 LF (GAUZE/BANDAGES/DRESSINGS) ×1 IMPLANT
GLOVE BIO SURGEON STRL SZ7.5 (GLOVE) ×3 IMPLANT
GLOVE BIOGEL PI IND STRL 6.5 (GLOVE) IMPLANT
GLOVE BIOGEL PI IND STRL 7.0 (GLOVE) IMPLANT
GLOVE BIOGEL PI IND STRL 7.5 (GLOVE) IMPLANT
GLOVE BIOGEL PI INDICATOR 6.5 (GLOVE) ×2
GLOVE BIOGEL PI INDICATOR 7.0 (GLOVE) ×4
GLOVE BIOGEL PI INDICATOR 7.5 (GLOVE) ×6
GLOVE SURG SS PI 6.5 STRL IVOR (GLOVE) ×2 IMPLANT
GLOVE SURG SS PI 7.0 STRL IVOR (GLOVE) ×4 IMPLANT
GOWN STRL REUS W/ TWL LRG LVL3 (GOWN DISPOSABLE) ×2 IMPLANT
GOWN STRL REUS W/TWL LRG LVL3 (GOWN DISPOSABLE) ×6
KIT BASIN OR (CUSTOM PROCEDURE TRAY) ×3 IMPLANT
KIT ROOM TURNOVER OR (KITS) ×3 IMPLANT
LIQUID BAND (GAUZE/BANDAGES/DRESSINGS) ×5 IMPLANT
NDL 18GX1X1/2 (RX/OR ONLY) (NEEDLE) IMPLANT
NDL HYPO 25GX1X1/2 BEV (NEEDLE) IMPLANT
NEEDLE 18GX1X1/2 (RX/OR ONLY) (NEEDLE) ×3 IMPLANT
NEEDLE HYPO 25GX1X1/2 BEV (NEEDLE) ×3 IMPLANT
NS IRRIG 1000ML POUR BTL (IV SOLUTION) ×3 IMPLANT
PACK GENERAL/GYN (CUSTOM PROCEDURE TRAY) ×3 IMPLANT
PAD ARMBOARD 7.5X6 YLW CONV (MISCELLANEOUS) ×3 IMPLANT
PLASMABLADE 3.0S (MISCELLANEOUS) ×3
SPECIMEN JAR X LARGE (MISCELLANEOUS) ×3 IMPLANT
SUT ETHILON 3 0 FSL (SUTURE) ×5 IMPLANT
SUT MNCRL AB 3-0 PS2 18 (SUTURE) ×2 IMPLANT
SUT MON AB 4-0 PC3 18 (SUTURE) ×3 IMPLANT
SUT VIC AB 3-0 54X BRD REEL (SUTURE) IMPLANT
SUT VIC AB 3-0 BRD 54 (SUTURE)
SUT VIC AB 3-0 SH 18 (SUTURE) ×3 IMPLANT
SYR CONTROL 10ML LL (SYRINGE) ×2 IMPLANT
TAPE CLOTH SURG 6X10 WHT LF (GAUZE/BANDAGES/DRESSINGS) ×2 IMPLANT
TOWEL OR 17X24 6PK STRL BLUE (TOWEL DISPOSABLE) ×1 IMPLANT
TOWEL OR 17X26 10 PK STRL BLUE (TOWEL DISPOSABLE) ×3 IMPLANT
TUBE CONNECTING 12'X1/4 (SUCTIONS) ×1
TUBE CONNECTING 12X1/4 (SUCTIONS) ×2 IMPLANT

## 2015-10-01 NOTE — Anesthesia Preprocedure Evaluation (Signed)
Anesthesia Evaluation  Patient identified by MRN, date of birth, ID band Patient awake    Reviewed: Allergy & Precautions, NPO status , Patient's Chart, lab work & pertinent test results, reviewed documented beta blocker date and time   Airway Mallampati: III  TM Distance: >3 FB Neck ROM: Full    Dental no notable dental hx. (+) Teeth Intact   Pulmonary former smoker,    Pulmonary exam normal breath sounds clear to auscultation       Cardiovascular hypertension, Pt. on medications and Pt. on home beta blockers Normal cardiovascular exam Rhythm:Regular Rate:Normal     Neuro/Psych  Headaches, negative psych ROS   GI/Hepatic Neg liver ROS, GERD  Medicated and Controlled,  Endo/Other  diabetes, Well Controlled, Type 2, Oral Hypoglycemic AgentsMorbid obesity  Renal/GU negative Renal ROS  negative genitourinary   Musculoskeletal  (+) Arthritis , Gout   Abdominal (+) + obese,   Peds  Hematology negative hematology ROS (+)   Anesthesia Other Findings   Reproductive/Obstetrics negative OB ROS                             Anesthesia Physical Anesthesia Plan  ASA: III  Anesthesia Plan: General   Post-op Pain Management:    Induction: Intravenous  Airway Management Planned: Oral ETT  Additional Equipment:   Intra-op Plan:   Post-operative Plan: Extubation in OR  Informed Consent: I have reviewed the patients History and Physical, chart, labs and discussed the procedure including the risks, benefits and alternatives for the proposed anesthesia with the patient or authorized representative who has indicated his/her understanding and acceptance.   Dental advisory given  Plan Discussed with: CRNA, Anesthesiologist and Surgeon  Anesthesia Plan Comments:         Anesthesia Quick Evaluation

## 2015-10-01 NOTE — Op Note (Signed)
10/01/2015  1:05 PM  PATIENT:  [name]  55 y.o. female  PRE-OPERATIVE DIAGNOSIS:  RIGHT BREAST CANCER  POST-OPERATIVE DIAGNOSIS:  RIGHT BREAST CANCER  PROCEDURE:  Procedure(s): RIGHT MASTECTOMY WITH SENTINEL LYMPH NODE BIOPSY (Right) and subsequent right axillary lymph node dissection  SURGEON:  Surgeon(s) and Role:    * Jovita Kussmaul, MD - Primary  PHYSICIAN ASSISTANT:   ASSISTANTS: Sharyn Dross, RNFA   ANESTHESIA:   general  EBL:     BLOOD ADMINISTERED:none  DRAINS: (2) Jackson-Pratt drain(s) with closed bulb suction in the prepectoral space   LOCAL MEDICATIONS USED:  NONE  SPECIMEN:  Source of Specimen:  right mastectomy and axillary contents  DISPOSITION OF SPECIMEN:  PATHOLOGY  COUNTS:  YES  TOURNIQUET:  * No tourniquets in log *  DICTATION: .Dragon Dictation   After informed consent was obtained the patient was brought to the operating room and placed in the supine position on the operating room table.  After adequate induction of general anesthesia the patient's right chest, breast, and axillary area were prepped with ChloraPrep, allowed to dry, and draped in usual sterile manner. Early in the day the patient underwent injection of 1 mCi of technetium sulfur colloid in the subareolar position on the right. At this point I checked the right axilla with the neoprobe and there was no radioactive activity in the axilla. I then injected 2 mL of methylene blue and 3 mL of injectable saline also in the subareolar position on the right and the breast was massaged for several minutes. An elliptical incision was then made around the nipple and areola complex in order to minimize the excess skin with a 10 blade knife. The incision was carried through the skin and subcutaneous tissue sharply with  Plasma blade. Breast hooks were then used to elevate the skin flaps anteriorly towards the ceiling. Thin skin flaps were created circumferentially between the breast tissue in the  subcutaneous fat. This dissection was carried circumferentially all the way to the chest wall. Once the dissection laterally reached the axilla The neoprobe was used again to  Evaluate the axilla and no significant radioactivity was appreciated. There were some enlarged lymph nodes along the inferior axilla and a couple of these were sent for touch preps. Next the breast was removed from the pectoralis muscle with the pectoralis fascia. As the axilla was examined there were  Several large matted  Lymph nodes identified. Because of the appearance of the lymph nodes and the fact that the tracer would not travel to the lymphatics I decided to evacuate the axilla. I identified the serratus muscle medially, the latissimus muscle laterally, and the axillary vein superiorly. The contents within these boundaries were then bluntly dissected free with a right angle clamp. The long thoracic and thoracodorsal nerves were identified and spared. Several small vessels were controlled with clips. Once this was accomplished the breast with the axillary contents were sent together to pathology for further evaluation. Hemostasis was achieved using the plasma blade. The wound was irrigated with copious amounts of saline. 2 small stab incisions were made near the anterior axillary line inferior to the operative bed with a 15 blade knife. A tonsil clamp was placed through each of these openings and used to bring 19 [place] round Blake drains into the operative bed. The lateral drain was placed in the axilla and the medial drain was curled along the chest wall. The drains were anchored to the skin with 3-0 nylon stitches. Next the  superior and inferior skin flaps were grossly reapproximated with interrupted 3-0 Vicryl stitches. The skin was then closed with a running 4-0 Monocryl subcuticular stitch. Dermabond dressings were applied. Along with other sterile dressings and a breast binder. The patient tolerated the procedure well. At the  end of the case all needle sponge and instrument counts were correct. The patient was then awakened and taken to recovery in stable condition.  PLAN OF CARE: Admit for overnight observation  PATIENT DISPOSITION:  PACU - hemodynamically stable.   Delay start of Pharmacological VTE agent (>24hrs) due to surgical blood loss or risk of bleeding: not applicable

## 2015-10-01 NOTE — Progress Notes (Signed)
During block patient was monitored continuously but not linked in Epic. SpO2 maintained 100% on 2 lpm [place]. Heart rate 60's NSR with not ectopy. RR 16- 20.

## 2015-10-01 NOTE — Anesthesia Postprocedure Evaluation (Signed)
Anesthesia Post Note  Patient: [name]  Procedure(s) Performed: Procedure(s) (LRB): RIGHT MASTECTOMY WITH SENTINEL LYMPH NODE BIOPSY (Right)  Patient location during evaluation: PACU Anesthesia Type: General Level of consciousness: awake and alert and oriented Pain management: pain level controlled Vital Signs Assessment: post-procedure vital signs reviewed and stable Respiratory status: spontaneous breathing, nonlabored ventilation, respiratory function stable and patient connected to nasal cannula oxygen Cardiovascular status: blood pressure returned to baseline and stable Postop Assessment: no signs of nausea or vomiting Anesthetic complications: no    Last Vitals:  Filed Vitals:   10/01/15 1345 10/01/15 1400  BP: 118/71 118/70  Pulse: 71 73  Temp:    Resp: 15 15    Last Pain:  Filed Vitals:   10/01/15 1400  PainSc: 6                  Bellatrix Devonshire A.

## 2015-10-01 NOTE — Interval H&P Note (Signed)
History and Physical Interval Note:  10/01/2015 9:30 AM  [name]  has presented today for surgery, with the diagnosis of RIGHT BREAST CANCER  The various methods of treatment have been discussed with the patient and family. After consideration of risks, benefits and other options for treatment, the patient has consented to  Procedure(s): RIGHT MASTECTOMY WITH SENTINEL LYMPH NODE BIOPSY (Right) as a surgical intervention .  The patient's history has been reviewed, patient examined, no change in status, stable for surgery.  I have reviewed the patient's chart and labs.  Questions were answered to the patient's satisfaction.     TOTH III,PAUL S

## 2015-10-01 NOTE — Transfer of Care (Signed)
Immediate Anesthesia Transfer of Care Note  Patient: [name]  Procedure(s) Performed: Procedure(s): RIGHT MASTECTOMY WITH SENTINEL LYMPH NODE BIOPSY (Right)  Patient Location: PACU  Anesthesia Type:GA combined with regional for post-op pain  Level of Consciousness: awake, alert  and oriented  Airway & Oxygen Therapy: Patient Spontanous Breathing and Patient connected to nasal cannula oxygen  Post-op Assessment: Report given to RN and Post -op Vital signs reviewed and stable  Post vital signs: Reviewed and stable  Last Vitals:  Filed Vitals:   10/01/15 1046 10/01/15 1328  BP:  113/64  Pulse: 65 66  Temp:  36.9 C  Resp: 20 16    Complications: No apparent anesthesia complications

## 2015-10-01 NOTE — Anesthesia Procedure Notes (Addendum)
Anesthesia Regional Block:  Pectoralis block  Pre-Anesthetic Checklist: ,, timeout performed, Correct Patient, Correct Site, Correct Laterality, Correct Procedure, Correct Position, site marked, Risks and benefits discussed,  Surgical consent,  Pre-op evaluation,  At surgeon's request and post-op pain management  Laterality: Right  Prep: Dura Prep       Needles:  Injection technique: Single-shot  Needle Type: Echogenic Needle     Needle Length: 9cm 9 cm Needle Gauge: 21 and 21 G  Needle insertion depth: 6 cm   Additional Needles:  Procedures: ultrasound guided (picture in chart) Pectoralis block Narrative:  Start time: 10/01/2015 10:25 AM End time: 10/01/2015 10:32 AM Injection made incrementally with aspirations every 5 mL.  Performed by: Personally  Anesthesiologist: Josephine Igo  Additional Notes: Patient tolerated procedure well.    Procedure Name: Intubation Date/Time: 10/01/2015 11:01 AM Performed by: [place] Pink Pre-anesthesia Checklist: Patient identified, Emergency Drugs available, Suction available, Patient being monitored and Timeout performed Patient Re-evaluated:Patient Re-evaluated prior to inductionOxygen Delivery Method: Circle system utilized Preoxygenation: Pre-oxygenation with 100% oxygen Intubation Type: IV induction Ventilation: Mask ventilation without difficulty Laryngoscope Size: Mac and 4 Grade View: Grade I Tube type: Oral Tube size: 7.0 mm Number of attempts: 1 Airway Equipment and Method: Stylet and LTA kit utilized Placement Confirmation: ETT inserted through vocal cords under direct vision,  positive ETCO2 and breath sounds checked- equal and bilateral Secured at: 21 cm Tube secured with: Tape Dental Injury: Teeth and Oropharynx as per pre-operative assessment

## 2015-10-01 NOTE — H&P (Signed)
[name]  Location: [facility] Patient #: [id number] DOB: 1960/11/15 Married / Language: English / Race: Black or African American Female   History of Present Illness The patient is a 55 year old female who presents with breast cancer. We are asked to see the patient in consultation by Dr. Luberta Robertson to evaluate her for a right breast cancer. The patient is a 55 year old black female who felt a mass in her right breast about a month ago. She was evaluated with mammogram and ultrasound and found to have 3 masses in the lower portion of the right breast. There were also abnormal calcifications associated with this that and an area of 11 cm. 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 Her cancer was a grade 1-2 cancer. She was ER/PR positive. Her HER-2/neu was equivocal. Her Ki-67 was 10%.She denies any breast pain or discharge from the nipple. She quit smoking approximately 1 month ago. She does not take any hormone replacement.   Other Problems Arthritis Back Pain Breast Cancer Diabetes Mellitus Gastroesophageal Reflux Disease High blood pressure Lump In Breast Oophorectomy  Past Surgical History  Breast Biopsy Bilateral. Colon Polyp Removal - Colonoscopy Gallbladder Surgery - Laparoscopic Hysterectomy (not due to cancer) - Complete Knee Surgery Bilateral. Oral Surgery Sentinel Lymph Node Biopsy Tonsillectomy  Diagnostic Studies History Colonoscopy 1-5 years ago Mammogram within last year Pap Smear 1-5 years ago  Medication History  No Current Medications Medications Reconciled  Social History Caffeine use Carbonated beverages. No alcohol use No drug use Tobacco use Former smoker.  Family History Alcohol Abuse Brother, Family Members In General. Arthritis Father, Mother. Bleeding disorder Son. Cancer Mother. Colon Cancer Family Members In General. Diabetes Mellitus Family Members In General,  Sister. Heart Disease Brother, Father. Heart disease in female family member before age 66 Hypertension Brother, Father. Kidney Disease Sister. Migraine Headache Brother, Mother, Sister. Prostate Cancer Father. Seizure disorder Family Members In General. Thyroid problems Mother, Sister.  Pregnancy / Birth History Age at menarche 38 years. Age of menopause 66-50 Gravida 3 Irregular periods Maternal age 59-25 Para 3    Review of Systems  General Present- Night Sweats. Not Present- Appetite Loss, Chills, Fatigue, Fever, Weight Gain and Weight Loss. Skin Not Present- Change in Wart/Mole, Dryness, Hives, Jaundice, New Lesions, Non-Healing Wounds, Rash and Ulcer. HEENT Present- Wears glasses/contact lenses. Not Present- Earache, Hearing Loss, Hoarseness, Nose Bleed, Oral Ulcers, Ringing in the Ears, Seasonal Allergies, Sinus Pain, Sore Throat, Visual Disturbances and Yellow Eyes. Respiratory Present- Snoring. Not Present- Bloody sputum, Chronic Cough, Difficulty Breathing and Wheezing. Breast Present- Breast Mass. Not Present- Breast Pain, Nipple Discharge and Skin Changes. Cardiovascular Not Present- Chest Pain, Difficulty Breathing Lying Down, Leg Cramps, Palpitations, Rapid Heart Rate, Shortness of Breath and Swelling of Extremities. Gastrointestinal Not Present- Abdominal Pain, Bloating, Bloody Stool, Change in Bowel Habits, Chronic diarrhea, Constipation, Difficulty Swallowing, Excessive gas, Gets full quickly at meals, Hemorrhoids, Indigestion, Nausea, Rectal Pain and Vomiting. Female Genitourinary Not Present- Frequency, Nocturia, Painful Urination, Pelvic Pain and Urgency. Musculoskeletal Present- Joint Pain and Joint Stiffness. Not Present- Back Pain, Muscle Pain, Muscle Weakness and Swelling of Extremities. Neurological Present- Trouble walking. Not Present- Decreased Memory, Fainting, Headaches, Numbness, Seizures, Tingling, Tremor and Weakness. Endocrine Present- Hot  flashes. Not Present- Cold Intolerance, Excessive Hunger, Hair Changes, Heat Intolerance and New Diabetes. Hematology Present- Easy Bruising. Not Present- Excessive bleeding, Gland problems, HIV and Persistent Infections.   Physical Exam  General Mental Status-Alert. General Appearance-Consistent with stated  age. Hydration-Well hydrated. Voice-Normal.  Head and Neck Head-normocephalic, atraumatic with no lesions or palpable masses. Trachea-midline. Thyroid Gland Characteristics - normal size and consistency.  Eye Eyeball - Bilateral-Extraocular movements intact. Sclera/Conjunctiva - Bilateral-No scleral icterus.  Chest and Lung Exam Chest and lung exam reveals -quiet, even and easy respiratory effort with no use of accessory muscles and on auscultation, normal breath sounds, no adventitious sounds and normal vocal resonance. Inspection Chest Wall - Normal. Back - normal.  Breast Note: The patient has a palpable mass in the lower portion of the right breast. There is no palpable mass in the left breast. There is no palpable axillary, supraclavicular, or cervical lymphadenopathy.   Cardiovascular Cardiovascular examination reveals -normal heart sounds, regular rate and rhythm with no murmurs and normal pedal pulses bilaterally.  Abdomen Inspection Inspection of the abdomen reveals - No Hernias. Skin - Scar - no surgical scars. Palpation/Percussion Palpation and Percussion of the abdomen reveal - Soft, Non Tender, No Rebound tenderness, No Rigidity (guarding) and No hepatosplenomegaly. Auscultation Auscultation of the abdomen reveals - Bowel sounds normal.  Neurologic Neurologic evaluation reveals -alert and oriented x 3 with no impairment of recent or remote memory. Mental Status-Normal.  Musculoskeletal Normal Exam - Left-Upper Extremity Strength Normal and Lower Extremity Strength Normal. Normal Exam - Right-Upper Extremity Strength Normal  and Lower Extremity Strength Normal.  Lymphatic Head & Neck  General Head & Neck Lymphatics: Bilateral - Description - Normal. Axillary  General Axillary Region: Bilateral - Description - Normal. Tenderness - Non Tender. Femoral & Inguinal  Generalized Femoral & Inguinal Lymphatics: Bilateral - Description - Normal. Tenderness - Non Tender.    Assessment & Plan  BREAST CANCER OF LOWER-OUTER QUADRANT OF RIGHT FEMALE BREAST (C50.511) Impression: The patient has a cancer in the right breast involving a large portion of the lower part. Because of the size of the area involved a think she would best be treated with a mastectomy. She is a good candidate for sentinel node mapping. I have discussed with her in detail the risks and benefits of the operation and do this as well as some of the technical aspects and she understands and wishes to proceed. She is not interested in reconstruction at this time.    Signed by Luella Cook, MD

## 2015-10-02 ENCOUNTER — Encounter (HOSPITAL_COMMUNITY): Payer: Self-pay | Admitting: General Practice

## 2015-10-02 DIAGNOSIS — C50511 Malignant neoplasm of lower-outer quadrant of right female breast: Secondary | ICD-10-CM | POA: Diagnosis not present

## 2015-10-02 MED ORDER — OXYCODONE-ACETAMINOPHEN 5-325 MG PO TABS: 1.0000 | ORAL_TABLET | ORAL | Status: DC | PRN

## 2015-10-02 MED ORDER — PHENOL 1.4 % MT LIQD
1.0000 | OROMUCOSAL | Status: DC | PRN
  Filled 2015-10-02: qty 177

## 2015-10-02 NOTE — Progress Notes (Signed)
1 Day Post-Op  Subjective: No complaints.  Objective: Vital signs in last 24 hours: Temp:  [97.3 F (36.3 C)-98.4 F (36.9 C)] 97.9 F (36.6 C) (01/10 0418) Pulse Rate:  [62-92] 87 (01/10 0418) Resp:  [15-20] 19 (01/10 0418) BP: (113-143)/(62-88) 135/88 mmHg (01/10 0418) SpO2:  [94 %-100 %] 97 % (01/10 0418) Weight:  [115.214 kg (254 lb)] 115.214 kg (254 lb) (01/09 0940) Last BM Date: 09/30/15  Intake/Output from previous day: 01/09 0701 - 01/10 0700 In: 800 [I.V.:800] Out: 197 [Drains:147; Blood:50] Intake/Output this shift:    Resp: clear to auscultation bilaterally Chest wall: skin flaps look good Cardio: regular rate and rhythm GI: soft, non-tender; bowel sounds normal; no masses,  no organomegaly  Lab Results:  No results for input(s): WBC, HGB, HCT, PLT in the last 72 hours. BMET No results for input(s): NA, K, CL, CO2, GLUCOSE, BUN, CREATININE, CALCIUM in the last 72 hours. PT/INR No results for input(s): LABPROT, INR in the last 72 hours. ABG No results for input(s): PHART, HCO3 in the last 72 hours.  Invalid input(s): PCO2, PO2  Studies/Results: Nm Sentinel Node Inj-no Rpt (breast)  10/01/2015  CLINICAL DATA: right breast cancer Sulfur colloid was injected intradermally by the nuclear medicine technologist for breast cancer sentinel node localization.    Anti-infectives: Anti-infectives    Start     Dose/Rate Route Frequency Ordered Stop   10/01/15 1030  ceFAZolin (ANCEF) IVPB 2 g/50 mL premix     2 g 100 mL/hr over 30 Minutes Intravenous To [facility] 09/30/15 0948 10/01/15 1105      Assessment/Plan: s/p Procedure(s): RIGHT MASTECTOMY WITH SENTINEL LYMPH NODE BIOPSY (Right) Advance diet Discharge     TOTH III,PAUL S 10/02/2015

## 2015-10-02 NOTE — Progress Notes (Signed)
Discussed discharge summary with patient. Reviewed all medications with patient. Patient received Rx and JP documentation with supplies. Patient ready for discharge.

## 2015-10-05 ENCOUNTER — Ambulatory Visit (INDEPENDENT_AMBULATORY_CARE_PROVIDER_SITE_OTHER): Payer: 59 | Admitting: Family Medicine

## 2015-10-05 ENCOUNTER — Telehealth: Payer: Self-pay | Admitting: Family Medicine

## 2015-10-05 ENCOUNTER — Encounter: Payer: Self-pay | Admitting: Family Medicine

## 2015-10-05 VITALS — BP 138/80 | Temp 98.1°F | Ht 67.0 in | Wt 252.0 lb

## 2015-10-05 DIAGNOSIS — J019 Acute sinusitis, unspecified: Secondary | ICD-10-CM | POA: Diagnosis not present

## 2015-10-05 MED ORDER — AZITHROMYCIN 250 MG PO TABS: ORAL_TABLET | ORAL | Status: DC

## 2015-10-05 NOTE — Telephone Encounter (Signed)
[facility] [place] [facility] - Client [place] [facility] Patient Name: [name] DOB: 11/15/1960 Initial Comment Caller states d/c Tues after mastectomy, having sinus pressure, nose, ear and throat hurt; Nurse Assessment Nurse: Mechele Dawley, RN, Amy Date/Time (Eastern Time): 10/05/2015 8:26:48 AM Confirm and document reason for call. If symptomatic, describe symptoms. ---EARS AND THROAT ARE REAL SCRATCHY. NO FEVER. Has the patient traveled out of the country within the last 30 days? ---Not Applicable Does the patient have any new or worsening symptoms? ---Yes Will a triage be completed? ---Yes Related visit to physician within the last 2 weeks? ---No Does the PT have any chronic conditions? (i.e. diabetes, asthma, etc.) ---Yes List chronic conditions. ---PLEASE SEE EPIC Is this a behavioral health or substance abuse call? ---No Guidelines Guideline Title Affirmed Question Affirmed Notes Sore Throat Diabetes mellitus or weak immune system (e.g., HIV positive, cancer chemo, splenectomy, organ transplant) Final Disposition User Go to ED Now (or PCP triage) Mechele Dawley, RN, Amy Comments she was just discharged on tues from hospital with mastectomy. Referrals REFERRED TO PCP OFFICE Disagree/Comply: Comply

## 2015-10-05 NOTE — Telephone Encounter (Signed)
Seen in the office today.

## 2015-10-05 NOTE — Progress Notes (Signed)
   Subjective:    Patient ID: [name], female    DOB: [birth year], 55 y.o.   MRN: [id number]  HPI Here to follow up a right mastectomy on 10-01-15 and to discuss sinus symptoms she has had for 3 days. The surgery went very well and all her lymph nodes were negative. The surgical margins were not clear however so she will be getting radiation treatments. 3 days ago se developed sinus pressure, PND, ST  And a dry cough. No fever.    Review of Systems  Constitutional: Negative.   HENT: Positive for congestion, postnasal drip and sinus pressure. Negative for ear pain and sore throat.   Eyes: Negative.   Respiratory: Positive for cough.        Objective:   Physical Exam  Constitutional: She is oriented to person, place, and time. She appears well-developed and well-nourished.  HENT:  Right Ear: External ear normal.  Left Ear: External ear normal.  Nose: Nose normal.  Mouth/Throat: Oropharynx is clear and moist.  Eyes: Conjunctivae are normal.  Neck: No thyromegaly present.  Cardiovascular: Normal rate, regular rhythm, normal heart sounds and intact distal pulses.   Pulmonary/Chest: Effort normal and breath sounds normal.  Lymphadenopathy:    She has no cervical adenopathy.  Neurological: She is alert and oriented to person, place, and time.          Assessment & Plan:  Treat the sinusitis with a Zpack. She has done well after her surgery.

## 2015-10-05 NOTE — Progress Notes (Signed)
Pre visit review using our clinic review tool, if applicable. No additional management support is needed unless otherwise documented below in the visit note. 

## 2015-10-05 NOTE — Telephone Encounter (Signed)
Patient has appt today with Dr. Sarajane Jews - [facility]

## 2015-10-15 ENCOUNTER — Encounter: Payer: Self-pay | Admitting: *Deleted

## 2015-10-15 NOTE — Progress Notes (Signed)
Ordered oncotype per Dr. [name].  Faxed requisition to pathology and confirmed receipt.  

## 2015-10-23 ENCOUNTER — Other Ambulatory Visit: Payer: Self-pay | Admitting: *Deleted

## 2015-10-24 ENCOUNTER — Telehealth: Payer: Self-pay | Admitting: *Deleted

## 2015-10-24 ENCOUNTER — Ambulatory Visit: Payer: 59 | Admitting: Oncology

## 2015-10-24 ENCOUNTER — Other Ambulatory Visit: Payer: 59

## 2015-10-24 NOTE — Telephone Encounter (Signed)
Called pt to cancel her appt with Dr. Jana Hakim today, d/t her oncotype testing will not be back. Informed we will r/s her appt if he needs to discuss the results in further detail. We will send her to xrt pending his recommendation on the results of her oncotype.

## 2015-10-25 ENCOUNTER — Other Ambulatory Visit: Payer: Self-pay | Admitting: Oncology

## 2015-10-26 ENCOUNTER — Other Ambulatory Visit: Payer: Self-pay | Admitting: General Surgery

## 2015-10-26 ENCOUNTER — Ambulatory Visit (HOSPITAL_BASED_OUTPATIENT_CLINIC_OR_DEPARTMENT_OTHER): Payer: 59 | Admitting: Oncology

## 2015-10-26 ENCOUNTER — Encounter (HOSPITAL_COMMUNITY): Payer: Self-pay

## 2015-10-26 VITALS — BP 134/78 | HR 68 | Temp 98.6°F | Resp 19 | Ht 67.0 in | Wt 260.4 lb

## 2015-10-26 DIAGNOSIS — M2550 Pain in unspecified joint: Secondary | ICD-10-CM

## 2015-10-26 DIAGNOSIS — M549 Dorsalgia, unspecified: Secondary | ICD-10-CM

## 2015-10-26 DIAGNOSIS — Z170 Estrogen receptor positive status [ER+]: Secondary | ICD-10-CM

## 2015-10-26 DIAGNOSIS — N951 Menopausal and female climacteric states: Secondary | ICD-10-CM | POA: Diagnosis not present

## 2015-10-26 DIAGNOSIS — C50311 Malignant neoplasm of lower-inner quadrant of right female breast: Secondary | ICD-10-CM | POA: Diagnosis not present

## 2015-10-26 DIAGNOSIS — R21 Rash and other nonspecific skin eruption: Secondary | ICD-10-CM

## 2015-10-26 DIAGNOSIS — E119 Type 2 diabetes mellitus without complications: Secondary | ICD-10-CM

## 2015-10-26 NOTE — Progress Notes (Signed)
[place]  [phone] Fax:([phone]     ID: [name] DOB: 10/13/1960  MR#: [id number]  PQD#:[id number]  Patient Care Team: Laurey Morale, MD as PCP - Inwood III, MD as Consulting Physician (General Surgery) Chauncey Cruel, MD as Consulting Physician (Oncology) Thea Silversmith, MD as Consulting Physician (Radiation Oncology) Sylvan Cheese, NP as Nurse Practitioner (Hematology and Oncology) PCP: Laurey Morale, MD GYN: OTHER MD: Mardi Mainland MD, Nicholaus Bloom MD  CHIEF COMPLAINT: Multicentric breast cancer  CURRENT TREATMENT: Adjuvant chemotherapy pending   BREAST CANCER HISTORY: From the original intake note:  [name] herself noted a change in her right breast in October and brought it to the attention of her primary care physician. Her last mammogram had been April 2010. Dr. Sarajane Jews set [name] up for bilateral diagnostic mammography with tomosynthesis and [facility]11[facility]2015. This found the breast density to be category B. In the right breast there were 3 microlobulated masses in the lower inner quadrant measuring 1.9, 1.5, and 1.5 cm. These were multicentric. There were also diffuse fine pleomorphic calcifications surrounding these masses, that area spanning 11.6 cm. The masses were palpable by exam at 4:00 8 cm from the nipple and at 5:00 3 cm from the nipple. By ultrasonography there was an irregular hypoechoic mass in the right breast at 4:00 measuring 1.7 cm, 1 medial to this measuring 1.1 cm, and then the third irregular hypoechoic mass at the 5:00 position measuring 1.3 cm. The right axilla showed multiple lymph nodes with thickened cortices. The largest lymph node measured 0.9 cm.  On 08/02/2015, [name] underwent biopsy of all 3 breast masses as well as a right axillary lymph node. 2 of the tumors were similar invasive ductal carcinomas, grade 2, estrogen receptor  95% positive, progesterone receptor 5% positive, with an MIB-1 of 10%, and HER-2 equivocal, with a signals ratio of 1.30 and the number per cell 4.0. The third tumor appeared's grade 1 or 2 was also estrogen receptor positive at 95%, progesterone receptor positive at 5%, with an MIP-1 of 5%, and a similar HER-2 profile the signals ratio being 1.46 but the number per cell being only 3.95. By immunohistochemistry on this tumor HER-2 was negative at 1+. Immunohistochemistry on the equivocal reading is pending.  Heavyn's subsequent history is as detailed below  INTERVAL HISTORY: [name] returns today for follow-up of her breast cancer. Since the last visit here she had her definitive surgery, a right radical mastectomy on 10/01/2015. This showed (SZA [id number]) and invasive ductal carcinoma, grade 2, with 2 separate foci present, the larger measuring 4.4 cm, the smaller 1.7 cm. The 2 tumors were similar and therefore Oncotype was sent only on the larger tumor. The anterior margin was broadly present in the in situ component was less than 0.1 cm also on the anterior margin. There was high-grade ductal carcinoma in situ in random tissue noted. There was no evidence of metastatic disease in a total of 20 lymph nodes, 6 of them sentinel lymph nodes.  The patient's margin status was discussed at the multidisciplinary breast cancer conference 10/24/2015. Because the margins were anterior, no further surgery or radiation was indicated.  The patient's Oncotype score was 32, predicting a 22% risk of outside the breast recurrence within 10 years if the patient's only systemic therapy is tamoxifen for 5 years. It also predicts a significant benefit from chemotherapy. The patient is here today to discuss those results  REVIEW OF SYSTEMS: [name] did well with her surgery. She did not have significant pain, although she does have some discomfort in the right axilla still. She had no fever or unusual bleeding. More recently  she thought she noted a little swelling in her right chest wall area. She brought this to medical attention and she was reassured. Of course she continues to have significant back and joint pain.She continues to see Dr. Hardin Negus for her pain management and that has been very stable. She has a little bit of a rash in an area of the scar that she wants me to look at. She thinks this may be due to some of the iodine soap used. She has hot flashes but not night sweats. Her blood sugars are well-controlled. Aside from these issues a detailed review of systems today was stable  PAST MEDICAL HISTORY: Past Medical History  Diagnosis Date  . Osteoarthritis   . Headache(784.0)     migraine like per patient  . Hypertension   . Low back pain     dr phillips, pain management  . Degenerative joint disease   . Gallstones   . GERD (gastroesophageal reflux disease)   . DDD (degenerative disc disease), cervical   . Colon polyps   . Diverticulosis   . Diabetes mellitus without complication ([place])   . Breast cancer of lower-inner quadrant of right female breast ([place]) 08/08/2015  . Breast cancer ([place])     PAST SURGICAL HISTORY: Past Surgical History  Procedure Laterality Date  . Replacement total knee Right     right - dr Novella Olive  . Ganglion cyst excision Right     right wrist  . Lipoma excision Right 02/21/08    right hip, [facility]  . Replacement total knee Left 06/25/09    left - dr Marciano Sequin [facility]  . Tonsillectomy    . Joint replacement      both knees have been done, four times each per patient. Her body rejects the implant, no infection.  . Abdominal hysterectomy  06/22/06    lap, dr Mancel Bale  . Other surgical history      lipoma removed from back 2012   . Cholecystectomy  10/28/2011    Procedure: LAPAROSCOPIC CHOLECYSTECTOMY WITH INTRAOPERATIVE CHOLANGIOGRAM;  Surgeon: Earnstine Regal, MD;  Location: [facility];  Service: General;  Laterality: N/A;  . Mass excision  08/24/2012    Procedure: EXCISION MASS;   Surgeon: Earnstine Regal, MD;  Location: [facility];  Service: General;  Laterality: Left;  excisie soft tissue masses left shoulder(back) & left upper arm  . Colonoscopy  02-03-13    per Dr. Henrene Pastor, diverticuli but no polyps, repeat in 5 yrs   . Mass excision Left 01/24/2014    Procedure: EXCISION SOFT TISSUE MASSES LOWER LEFT BACK;  Surgeon: Earnstine Regal, MD;  Location: [facility];  Service: General;  Laterality: Left;  . Esophagogastroduodenoscopy  02-03-13    per Dr. Henrene Pastor, normal   . Cardiac catheterization      back in 200 by Dr. Stanford Breed  . Tonsillectomy    . Mastectomy w/ sentinel node biopsy Right 10/02/2015  . Mastectomy w/ sentinel node biopsy Right 10/01/2015    Procedure: RIGHT MASTECTOMY WITH SENTINEL LYMPH NODE BIOPSY;  Surgeon: Autumn Messing III, MD;  Location: [facility];  Service: General;  Laterality: Right;    FAMILY HISTORY Family History  Problem Relation Age of Onset  . Crohn's disease Other     nephew  .  Stomach cancer Mother   . Heart disease Father   . Prostate cancer Father   . Kidney cancer Sister     one out of the four  . Hypertension Sister   . Hypertension Brother   . Colon cancer Maternal Grandmother   The patient's father died at the age of 1 with congestive heart failure. Her mother is still living as of November 2016, age 66. The patient had 6 brothers, 4 sisters. One sister was diagnosed with kidney cancer at age 30. ([place]). She is doing well. The patient's maternal grandmother was diagnosed with colon cancer at the age of 72. The patient's mother was diagnosed with a "rare stomach cancer" 20 years ago. The patient's father was diagnosed with prostate cancer at age 40. There is no history of breast or ovarian cancer in the family to her knowledge.   GYNECOLOGIC HISTORY:  No LMP recorded. Patient has had a hysterectomy.  menarche age 65, first live birth age 70, the patient is GX P3. She underwent remote hysterectomy with bilateral  salpingo-oophorectomy. She did not use hormone replacement. She never used oral contraceptives.   SOCIAL HISTORY:  Anshi used to work as a "[employer] but she is now disabled because of her multiple arthritic and degenerative problems. Her husband Elberta Fortis A. Soden works as a Freight forwarder. Son Dalphine Handing works in long care in [place]. Son Evette Doffing works for a Arboriculturist in [place]. Daughter  Silva Bandy is a Haematologist in [place]. The patient has 5 grandchildren. She attends a Physicist, medical church    ADVANCED DIRECTIVES: Not in place   HEALTH MAINTENANCE: Social History  Substance Use Topics  . Smoking status: Former Smoker -- 0.25 packs/day for 20 years    Types: Cigarettes  . Smokeless tobacco: Former Systems developer    Quit date: 09/30/2015     Comment: quit  . Alcohol Use: No     Colonoscopy:February 2016   VZS:MOLMBE post hysterectomy   Bone density:  Lipid panel:  Allergies  Allergen Reactions  . Codeine Itching and Nausea And Vomiting    Itching all over the body  . Latex Hives  . Sulfonamide Derivatives Hives    All over the body    Current Outpatient Prescriptions  Medication Sig Dispense Refill  . amitriptyline (ELAVIL) 25 MG tablet Take 25 mg by mouth at bedtime.     Marland Kitchen amLODipine (NORVASC) 5 MG tablet Take 1 tablet (5 mg total) by mouth 2 (two) times daily. (Patient taking differently: Take 10 mg by mouth 2 (two) times daily. ) 180 tablet 3  . azithromycin (ZITHROMAX Z-PAK) 250 MG tablet As directed 6 each 0  . furosemide (LASIX) 20 MG tablet TAKE 1 TABLET BY MOUTH EVERY DAY 30 tablet 2  . lisinopril (PRINIVIL,ZESTRIL) 10 MG tablet Take 1 tablet (10 mg total) by mouth daily. (Patient taking differently: Take 20 mg by mouth daily. ) 90 tablet 3  . metFORMIN (GLUCOPHAGE) 500 MG tablet Take 1 tablet (500 mg total) by mouth 2 (two) times daily with a meal. 180 tablet 3  . methadone (DOLOPHINE) 10 MG tablet Take 10 mg by  mouth every 8 (eight) hours as needed for moderate pain.     . metoprolol (LOPRESSOR) 50 MG tablet Take 100 mg by mouth 2 (two) times daily.    Marland Kitchen omeprazole (PRILOSEC) 40 MG capsule Take 1 capsule (40 mg total) by mouth daily. 90 capsule 3  . oxyCODONE (ROXICODONE) 15 MG immediate release  tablet Take 15 mg by mouth every 4 (four) hours as needed for pain.     Marland Kitchen oxyCODONE-acetaminophen (ROXICET) 5-325 MG tablet Take 1-2 tablets by mouth every 4 (four) hours as needed. (Patient not taking: Reported on 10/05/2015) 50 tablet 0  . potassium chloride (K-DUR) 10 MEQ tablet Take 3 tablets (30 mEq total) by mouth 2 (two) times daily. 180 tablet 11  . temazepam (RESTORIL) 30 MG capsule TAKE ONE CAPSULE BY MOUTH AT BEDTIME AS NEEDED FOR SLEEP 90 capsule 1   No current facility-administered medications for this visit.    OBJECTIVE: middle-aged African-American woman in no acute distress Filed Vitals:   10/26/15 1124  BP: 134/78  Pulse: 68  Temp: 98.6 F (37 C)  Resp: 19     Body mass index is 40.77 kg/(m^2).    ECOG FS:2 - Symptomatic, <50% confined to bed  Sclerae unicteric, pupils round and equal Oropharynx clear and moist-- no thrush or other lesions No cervical or supraclavicular adenopathy Lungs no rales or rhonchi Heart regular rate and rhythm Abd soft, obese, nontender, positive bowel sounds MSK no focal spinal tenderness, no upper extremity lymphedema Neuro: nonfocal, well oriented, appropriate affect Breasts: The right breast is status post recent mastectomy. The incision is healing nicely. There are some areas of scar irregularity where there is fleshy gets flash and there is a minimal rash there. The right axilla is benign per the left breast is unremarkable   LAB RESULTS:  CMP     Component Value Date/Time   NA 141 09/25/2015 1339   NA 141 08/15/2015 0836   K 3.6 09/25/2015 1339   K 3.1* 08/15/2015 0836   CL 103 09/25/2015 1339   CO2 28 09/25/2015 1339   CO2 28 08/15/2015  0836   GLUCOSE 91 09/25/2015 1339   GLUCOSE 106 08/15/2015 0836   BUN 10 09/25/2015 1339   BUN 11.5 08/15/2015 0836   CREATININE 0.85 09/25/2015 1339   CREATININE 0.9 08/15/2015 0836   CALCIUM 9.5 09/25/2015 1339   CALCIUM 10.0 08/15/2015 0836   PROT 7.4 08/15/2015 0836   PROT 7.7 06/05/2015 1837   ALBUMIN 4.2 08/15/2015 0836   ALBUMIN 4.8 06/05/2015 1837   AST 14 08/15/2015 0836   AST 24 06/05/2015 1837   ALT <9 08/15/2015 0836   ALT 14 06/05/2015 1837   ALKPHOS 75 08/15/2015 0836   ALKPHOS 69 06/05/2015 1837   BILITOT 0.35 08/15/2015 0836   BILITOT 0.5 06/05/2015 1837   GFRNONAA >60 09/25/2015 1339   GFRAA >60 09/25/2015 1339    INo results found for: SPEP, UPEP  Lab Results  Component Value Date   WBC 7.7 09/25/2015   NEUTROABS 4.7 08/15/2015   HGB 12.5 09/25/2015   HCT 38.9 09/25/2015   MCV 87.2 09/25/2015   PLT 305 09/25/2015      Chemistry      Component Value Date/Time   NA 141 09/25/2015 1339   NA 141 08/15/2015 0836   K 3.6 09/25/2015 1339   K 3.1* 08/15/2015 0836   CL 103 09/25/2015 1339   CO2 28 09/25/2015 1339   CO2 28 08/15/2015 0836   BUN 10 09/25/2015 1339   BUN 11.5 08/15/2015 0836   CREATININE 0.85 09/25/2015 1339   CREATININE 0.9 08/15/2015 0836      Component Value Date/Time   CALCIUM 9.5 09/25/2015 1339   CALCIUM 10.0 08/15/2015 0836   ALKPHOS 75 08/15/2015 0836   ALKPHOS 69 06/05/2015 1837   AST 14 08/15/2015 0836  AST 24 06/05/2015 1837   ALT <9 08/15/2015 0836   ALT 14 06/05/2015 1837   BILITOT 0.35 08/15/2015 0836   BILITOT 0.5 06/05/2015 1837       No results found for: LABCA2  No components found for: LABCA125  No results for input(s): INR in the last 168 hours.  Urinalysis    Component Value Date/Time   COLORURINE YELLOW 06/05/2015 2353   APPEARANCEUR CLOUDY* 06/05/2015 2353   LABSPEC 1.007 06/05/2015 2353   PHURINE 6.5 06/05/2015 2353   GLUCOSEU NEGATIVE 06/05/2015 2353   HGBUR TRACE* 06/05/2015 2353    HGBUR large 07/01/2010 0945   BILIRUBINUR NEGATIVE 06/05/2015 2353   BILIRUBINUR n 02/26/2015 1121   KETONESUR NEGATIVE 06/05/2015 2353   PROTEINUR NEGATIVE 06/05/2015 2353   PROTEINUR n 02/26/2015 1121   UROBILINOGEN 0.2 06/05/2015 2353   UROBILINOGEN 0.2 02/26/2015 1121   NITRITE NEGATIVE 06/05/2015 2353   NITRITE n 02/26/2015 1121   LEUKOCYTESUR NEGATIVE 06/05/2015 2353    STUDIES: Nm Sentinel Node Inj-no Rpt (breast)  10/01/2015  CLINICAL DATA: right breast cancer Sulfur colloid was injected intradermally by the nuclear medicine technologist for breast cancer sentinel node localization.    ASSESSMENT: 55 y.o. [place] woman status post right breast biopsy 3 and axillary lymph node biopsy 08/02/2015 for a clinical mpT1c N0, s invasive ductal carcinoma, grade 1 or 2, estrogen receptor 95% positive, progesterone receptor 5% positive, with an MIB-1 between 5 and 10%, and HER-2 equivocal on 1 of the 2 biopsies (signals ratio 1.30, number per cell 4.0).tage IA   (1) status post right modified radical mastectomy 10/01/2015 for an mpT2 pN0, stage IIA invasive ductal carcinoma, grade 2, with a total of 20 benign lymph nodes removed  (2) Oncotype DX score of 32 predicts an outside the breast recurrence risk of 22% within 10 years if the patient's only systemic therapy is tamoxifen for 5 years. It also predicts significant benefit from adjuvant chemotherapy.  (3) patient to receive adjuvant doxorubicin and cyclophosphamide 4, be followed as tolerated by paclitaxel weekly 12  (a) consider PREVENT study will her  (4) postmastectomy radiation to follow as appropriate  (5) anti-estrogens to follow completion of local treatment  (a) consider PALLAS  Study  (b) consider BWEL study  PLAN: I discussed Ashlley's situation with her in detail. We are hoping she would be able to avoid chemotherapy but the Oncotype actually came back high risk. It projects not only a significant distant recurrence  rate without chemotherapy but actually predicts added benefit from chemotherapy (beyond the usual one third risk reduction quoted). Accordingly I did recommend she receive adjuvant chemotherapy.  We considered Cytoxan/docetaxel 4 cycles, but she would need to have significant steroids with that. With Cytoxan/doxorubicin we can probably avoid steroids altogether, which would not complicate her diabetes. I don't know if she will be able to tolerate the Taxol to follow because of her history of arthralgias and myalgias. At least a 4 cycles of AC however should make a significant dent in her risk of recurrence.  She was never seen by radiation oncologist and in general we don't do postmastectomy radiation for lymph node negative cases less than 5 cm. The brought a positive margin was anterior. Likely nothing needs to be done, since that was skin and therefore removed. I am checking with radiation oncology to make sure  I have set [name] up for an echo and chemotherapy school and I have discussed a port with Dr. Marlou Starks. It would be great if  we could start her on 11/06/2015. If not then her next treatment would be February 28. They will be a 3 week break after that because she would like to go to the beach for her wedding anniversary between March 15 and 19th. If possible we will give the other [facility] treatments 2 weeks apart.  Eily may qualify for the PREVENT study--have placed a referral  She knows to call for any problems that may develop before her next visit here.  Chauncey Cruel, MD   10/26/2015 11:51 AM Medical Oncology and Hematology [facility] [address] [place], [place] [address] Tel. [phone]    Fax. [phone]

## 2015-10-30 ENCOUNTER — Telehealth: Payer: Self-pay | Admitting: Oncology

## 2015-10-30 ENCOUNTER — Other Ambulatory Visit: Payer: Self-pay | Admitting: Oncology

## 2015-10-30 ENCOUNTER — Encounter (HOSPITAL_BASED_OUTPATIENT_CLINIC_OR_DEPARTMENT_OTHER): Payer: Self-pay | Admitting: *Deleted

## 2015-10-30 ENCOUNTER — Telehealth: Payer: Self-pay | Admitting: Nurse Practitioner

## 2015-10-30 ENCOUNTER — Encounter (HOSPITAL_BASED_OUTPATIENT_CLINIC_OR_DEPARTMENT_OTHER)
Admission: RE | Admit: 2015-10-30 | Discharge: 2015-10-30 | Disposition: A | Discharge status: Home / Self Care | Payer: 59 | Source: Ambulatory Visit | Attending: General Surgery | Admitting: General Surgery

## 2015-10-30 DIAGNOSIS — Z79899 Other long term (current) drug therapy: Secondary | ICD-10-CM | POA: Diagnosis not present

## 2015-10-30 DIAGNOSIS — Z6841 Body Mass Index (BMI) 40.0 and over, adult: Secondary | ICD-10-CM | POA: Diagnosis not present

## 2015-10-30 DIAGNOSIS — Z9011 Acquired absence of right breast and nipple: Secondary | ICD-10-CM | POA: Diagnosis not present

## 2015-10-30 DIAGNOSIS — C50519 Malignant neoplasm of lower-outer quadrant of unspecified female breast: Secondary | ICD-10-CM | POA: Diagnosis not present

## 2015-10-30 DIAGNOSIS — Z7984 Long term (current) use of oral hypoglycemic drugs: Secondary | ICD-10-CM | POA: Diagnosis not present

## 2015-10-30 DIAGNOSIS — I10 Essential (primary) hypertension: Secondary | ICD-10-CM | POA: Diagnosis not present

## 2015-10-30 DIAGNOSIS — F17210 Nicotine dependence, cigarettes, uncomplicated: Secondary | ICD-10-CM | POA: Diagnosis not present

## 2015-10-30 DIAGNOSIS — K219 Gastro-esophageal reflux disease without esophagitis: Secondary | ICD-10-CM | POA: Diagnosis not present

## 2015-10-30 DIAGNOSIS — Z170 Estrogen receptor positive status [ER+]: Secondary | ICD-10-CM | POA: Diagnosis not present

## 2015-10-30 LAB — BASIC METABOLIC PANEL
Anion gap: 15 (ref 5–15)
BUN: 8 mg/dL (ref 6–20)
CO2: 27 mmol/L (ref 22–32)
Calcium: 9.7 mg/dL (ref 8.9–10.3)
Chloride: 101 mmol/L (ref 101–111)
Creatinine, Ser: 0.79 mg/dL (ref 0.44–1.00)
GFR calc Af Amer: >60 mL/min (ref >60)
GFR calc non Af Amer: >60 mL/min (ref >60)
Glucose, Bld: 84 mg/dL (ref 65–99)
Potassium: 3.4 mmol/L — ABNORMAL LOW (ref 3.5–5.1)
Sodium: 143 mmol/L (ref 135–145)

## 2015-10-30 NOTE — Telephone Encounter (Signed)
Patient called to confirm upcomming appointments in Feb

## 2015-10-30 NOTE — Telephone Encounter (Signed)
Called patient and was not able to reach her at home/preferred # - called cell and was not able to speak with patient - per relative patient is having lab draw - asked that patient call me back re appointments at Dr [name]'s office for 2/10.

## 2015-10-31 NOTE — Telephone Encounter (Signed)
Also left message on voicemail for patient at [phone] (given by relative) re stop by schedule after 2/10 appointments to get a copy of appointments, specifically echo/B[facility].

## 2015-11-01 ENCOUNTER — Ambulatory Visit (HOSPITAL_BASED_OUTPATIENT_CLINIC_OR_DEPARTMENT_OTHER): Payer: 59 | Admitting: Anesthesiology

## 2015-11-01 ENCOUNTER — Ambulatory Visit (HOSPITAL_BASED_OUTPATIENT_CLINIC_OR_DEPARTMENT_OTHER)
Admission: RE | Admit: 2015-11-01 | Discharge: 2015-11-01 | Disposition: A | Discharge status: Home / Self Care | Payer: 59 | Source: Ambulatory Visit | Attending: General Surgery | Admitting: General Surgery

## 2015-11-01 ENCOUNTER — Encounter (HOSPITAL_BASED_OUTPATIENT_CLINIC_OR_DEPARTMENT_OTHER)
Admission: RE | Disposition: A | Discharge status: Home / Self Care | Payer: Self-pay | Source: Ambulatory Visit | Attending: General Surgery

## 2015-11-01 ENCOUNTER — Ambulatory Visit (HOSPITAL_COMMUNITY): Payer: 59

## 2015-11-01 ENCOUNTER — Encounter (HOSPITAL_BASED_OUTPATIENT_CLINIC_OR_DEPARTMENT_OTHER): Payer: Self-pay | Admitting: *Deleted

## 2015-11-01 DIAGNOSIS — F17210 Nicotine dependence, cigarettes, uncomplicated: Secondary | ICD-10-CM | POA: Insufficient documentation

## 2015-11-01 DIAGNOSIS — Z9011 Acquired absence of right breast and nipple: Secondary | ICD-10-CM | POA: Insufficient documentation

## 2015-11-01 DIAGNOSIS — K219 Gastro-esophageal reflux disease without esophagitis: Secondary | ICD-10-CM | POA: Insufficient documentation

## 2015-11-01 DIAGNOSIS — C50519 Malignant neoplasm of lower-outer quadrant of unspecified female breast: Secondary | ICD-10-CM | POA: Insufficient documentation

## 2015-11-01 DIAGNOSIS — I10 Essential (primary) hypertension: Secondary | ICD-10-CM | POA: Insufficient documentation

## 2015-11-01 DIAGNOSIS — Z79899 Other long term (current) drug therapy: Secondary | ICD-10-CM | POA: Insufficient documentation

## 2015-11-01 DIAGNOSIS — Z7984 Long term (current) use of oral hypoglycemic drugs: Secondary | ICD-10-CM | POA: Insufficient documentation

## 2015-11-01 DIAGNOSIS — Z170 Estrogen receptor positive status [ER+]: Secondary | ICD-10-CM | POA: Insufficient documentation

## 2015-11-01 DIAGNOSIS — Z6841 Body Mass Index (BMI) 40.0 and over, adult: Secondary | ICD-10-CM | POA: Insufficient documentation

## 2015-11-01 DIAGNOSIS — Z95828 Presence of other vascular implants and grafts: Secondary | ICD-10-CM

## 2015-11-01 HISTORY — PX: PORTACATH PLACEMENT: SHX2246

## 2015-11-01 LAB — GLUCOSE, CAPILLARY
Glucose-Capillary: 104 mg/dL — ABNORMAL HIGH (ref 65–99)
Glucose-Capillary: 117 mg/dL — ABNORMAL HIGH (ref 65–99)

## 2015-11-01 SURGERY — INSERTION, TUNNELED CENTRAL VENOUS DEVICE, WITH PORT
Anesthesia: General | Site: Chest | Laterality: Left

## 2015-11-01 MED ORDER — LIDOCAINE HCL (CARDIAC) 20 MG/ML IV SOLN
INTRAVENOUS | Status: AC
  Filled 2015-11-01: qty 5

## 2015-11-01 MED ORDER — FENTANYL CITRATE (PF) 100 MCG/2ML IJ SOLN
INTRAMUSCULAR | Status: AC
  Filled 2015-11-01: qty 2

## 2015-11-01 MED ORDER — LIDOCAINE HCL (CARDIAC) 20 MG/ML IV SOLN
INTRAVENOUS | Status: DC | PRN
  Administered 2015-11-01: 75 mg via INTRAVENOUS

## 2015-11-01 MED ORDER — PROPOFOL 10 MG/ML IV BOLUS
INTRAVENOUS | Status: DC | PRN
  Administered 2015-11-01: 300 mg via INTRAVENOUS

## 2015-11-01 MED ORDER — GLYCOPYRROLATE 0.2 MG/ML IJ SOLN: 0.2000 mg | Freq: Once | INTRAMUSCULAR | Status: DC | PRN

## 2015-11-01 MED ORDER — LACTATED RINGERS IV SOLN
INTRAVENOUS | Status: DC
  Administered 2015-11-01: 08:00:00 via INTRAVENOUS

## 2015-11-01 MED ORDER — ATROPINE SULFATE 0.4 MG/ML IJ SOLN
INTRAMUSCULAR | Status: AC
  Filled 2015-11-01: qty 1

## 2015-11-01 MED ORDER — DEXAMETHASONE SODIUM PHOSPHATE 10 MG/ML IJ SOLN
INTRAMUSCULAR | Status: AC
  Filled 2015-11-01: qty 1

## 2015-11-01 MED ORDER — CEFAZOLIN SODIUM-DEXTROSE 2-3 GM-% IV SOLR
INTRAVENOUS | Status: AC
  Filled 2015-11-01: qty 50

## 2015-11-01 MED ORDER — SCOPOLAMINE 1 MG/3DAYS TD PT72: 1.0000 | MEDICATED_PATCH | Freq: Once | TRANSDERMAL | Status: DC | PRN

## 2015-11-01 MED ORDER — SUCCINYLCHOLINE CHLORIDE 20 MG/ML IJ SOLN
INTRAMUSCULAR | Status: AC
  Filled 2015-11-01: qty 1

## 2015-11-01 MED ORDER — CHLORHEXIDINE GLUCONATE 4 % EX LIQD: 1.0000 "application " | Freq: Once | CUTANEOUS | Status: DC

## 2015-11-01 MED ORDER — HEPARIN SOD (PORK) LOCK FLUSH 100 UNIT/ML IV SOLN
INTRAVENOUS | Status: AC
  Filled 2015-11-01: qty 5

## 2015-11-01 MED ORDER — BUPIVACAINE HCL (PF) 0.25 % IJ SOLN
INTRAMUSCULAR | Status: DC | PRN
  Administered 2015-11-01: 8 mL

## 2015-11-01 MED ORDER — HEPARIN (PORCINE) IN NACL 2-0.9 UNIT/ML-% IJ SOLN
INTRAMUSCULAR | Status: DC | PRN
  Administered 2015-11-01: 500 mL via INTRAVENOUS

## 2015-11-01 MED ORDER — EPHEDRINE SULFATE 50 MG/ML IJ SOLN
INTRAMUSCULAR | Status: AC
  Filled 2015-11-01: qty 1

## 2015-11-01 MED ORDER — OXYCODONE HCL 5 MG PO TABS: 5.0000 mg | ORAL_TABLET | Freq: Once | ORAL | Status: DC | PRN

## 2015-11-01 MED ORDER — HEPARIN (PORCINE) IN NACL 2-0.9 UNIT/ML-% IJ SOLN
INTRAMUSCULAR | Status: AC
  Filled 2015-11-01: qty 500

## 2015-11-01 MED ORDER — MIDAZOLAM HCL 2 MG/2ML IJ SOLN
1.0000 mg | INTRAMUSCULAR | Status: DC | PRN
  Administered 2015-11-01: 2 mg via INTRAVENOUS

## 2015-11-01 MED ORDER — OXYCODONE-ACETAMINOPHEN 5-325 MG PO TABS: 1.0000 | ORAL_TABLET | ORAL | Status: DC | PRN

## 2015-11-01 MED ORDER — PROPOFOL 500 MG/50ML IV EMUL
INTRAVENOUS | Status: AC
  Filled 2015-11-01: qty 50

## 2015-11-01 MED ORDER — OXYCODONE HCL 5 MG/5ML PO SOLN: 5.0000 mg | Freq: Once | ORAL | Status: DC | PRN

## 2015-11-01 MED ORDER — PHENYLEPHRINE 40 MCG/ML (10ML) SYRINGE FOR IV PUSH (FOR BLOOD PRESSURE SUPPORT)
PREFILLED_SYRINGE | INTRAVENOUS | Status: AC
  Filled 2015-11-01: qty 10

## 2015-11-01 MED ORDER — CEFAZOLIN SODIUM-DEXTROSE 2-3 GM-% IV SOLR
2.0000 g | INTRAVENOUS | Status: AC
  Administered 2015-11-01: 2 g via INTRAVENOUS

## 2015-11-01 MED ORDER — HEPARIN SOD (PORK) LOCK FLUSH 100 UNIT/ML IV SOLN
INTRAVENOUS | Status: DC | PRN
  Administered 2015-11-01: 400 [IU]

## 2015-11-01 MED ORDER — PROMETHAZINE HCL 25 MG/ML IJ SOLN: 6.2500 mg | INTRAMUSCULAR | Status: DC | PRN

## 2015-11-01 MED ORDER — ONDANSETRON HCL 4 MG/2ML IJ SOLN
INTRAMUSCULAR | Status: AC
  Filled 2015-11-01: qty 2

## 2015-11-01 MED ORDER — FENTANYL CITRATE (PF) 100 MCG/2ML IJ SOLN
25.0000 ug | INTRAMUSCULAR | Status: DC | PRN
Start: 2015-11-01 — End: 2015-11-01
  Administered 2015-11-01 (×2): 50 ug via INTRAVENOUS

## 2015-11-01 MED ORDER — ARTIFICIAL TEARS OP OINT
TOPICAL_OINTMENT | OPHTHALMIC | Status: AC
  Filled 2015-11-01: qty 3.5

## 2015-11-01 MED ORDER — BUPIVACAINE HCL (PF) 0.25 % IJ SOLN
INTRAMUSCULAR | Status: AC
  Filled 2015-11-01: qty 30

## 2015-11-01 MED ORDER — DEXAMETHASONE SODIUM PHOSPHATE 4 MG/ML IJ SOLN
INTRAMUSCULAR | Status: DC | PRN
  Administered 2015-11-01: 10 mg via INTRAVENOUS

## 2015-11-01 MED ORDER — ONDANSETRON HCL 4 MG/2ML IJ SOLN
INTRAMUSCULAR | Status: DC | PRN
  Administered 2015-11-01: 4 mg via INTRAVENOUS

## 2015-11-01 MED ORDER — MIDAZOLAM HCL 2 MG/2ML IJ SOLN
INTRAMUSCULAR | Status: AC
  Filled 2015-11-01: qty 2

## 2015-11-01 MED ORDER — PROPOFOL 500 MG/50ML IV EMUL
INTRAVENOUS | Status: AC
Start: 2015-11-01 — End: 2015-11-01
  Filled 2015-11-01: qty 50

## 2015-11-01 MED ORDER — FENTANYL CITRATE (PF) 100 MCG/2ML IJ SOLN
50.0000 ug | INTRAMUSCULAR | Status: DC | PRN
  Administered 2015-11-01: 100 ug via INTRAVENOUS

## 2015-11-01 SURGICAL SUPPLY — 49 items
BAG DECANTER FOR FLEXI CONT (MISCELLANEOUS) ×3 IMPLANT
BLADE SURG 15 STRL LF DISP TIS (BLADE) ×1 IMPLANT
BLADE SURG 15 STRL SS (BLADE) ×3
CANISTER SUCT 1200ML W/VALVE (MISCELLANEOUS) IMPLANT
CHLORAPREP W/TINT 26ML (MISCELLANEOUS) ×3 IMPLANT
CLEANER CAUTERY TIP 5X5 PAD (MISCELLANEOUS) ×1 IMPLANT
COVER BACK TABLE 60X90IN (DRAPES) ×3 IMPLANT
COVER MAYO STAND STRL (DRAPES) ×3 IMPLANT
DECANTER SPIKE VIAL GLASS SM (MISCELLANEOUS) IMPLANT
DRAPE C-ARM 42X72 X-RAY (DRAPES) ×3 IMPLANT
DRAPE LAPAROSCOPIC ABDOMINAL (DRAPES) ×3 IMPLANT
DRAPE UTILITY XL STRL (DRAPES) ×3 IMPLANT
ELECT REM PT RETURN 9FT ADLT (ELECTROSURGICAL) ×3
ELECTRODE REM PT RTRN 9FT ADLT (ELECTROSURGICAL) ×1 IMPLANT
GLOVE BIO SURGEON STRL SZ7.5 (GLOVE) ×1 IMPLANT
GLOVE BIOGEL PI IND STRL 8 (GLOVE) IMPLANT
GLOVE BIOGEL PI INDICATOR 8 (GLOVE) ×2
GLOVE SURG SS PI 7.0 STRL IVOR (GLOVE) ×2 IMPLANT
GLOVE SURG SS PI 7.5 STRL IVOR (GLOVE) ×4 IMPLANT
GOWN STRL REUS W/ TWL LRG LVL3 (GOWN DISPOSABLE) ×2 IMPLANT
GOWN STRL REUS W/TWL LRG LVL3 (GOWN DISPOSABLE) ×9
IV KIT MINILOC 20X1 SAFETY (NEEDLE) IMPLANT
KIT PORT POWER 8FR ISP CVUE (Catheter) ×2 IMPLANT
LIQUID BAND (GAUZE/BANDAGES/DRESSINGS) ×3 IMPLANT
NDL HYPO 25X1 1.5 SAFETY (NEEDLE) ×1 IMPLANT
NDL SAFETY ECLIPSE 18X1.5 (NEEDLE) IMPLANT
NDL SPNL 22GX3.5 QUINCKE BK (NEEDLE) IMPLANT
NEEDLE HYPO 18GX1.5 SHARP (NEEDLE)
NEEDLE HYPO 22GX1.5 SAFETY (NEEDLE) IMPLANT
NEEDLE HYPO 25X1 1.5 SAFETY (NEEDLE) ×3 IMPLANT
NEEDLE SPNL 22GX3.5 QUINCKE BK (NEEDLE) IMPLANT
PACK BASIN DAY SURGERY FS (CUSTOM PROCEDURE TRAY) ×3 IMPLANT
PAD CLEANER CAUTERY TIP 5X5 (MISCELLANEOUS) ×2
PENCIL BUTTON HOLSTER BLD 10FT (ELECTRODE) ×3 IMPLANT
SLEEVE SCD COMPRESS KNEE MED (MISCELLANEOUS) ×2 IMPLANT
SUT MON AB 4-0 PC3 18 (SUTURE) ×3 IMPLANT
SUT PROLENE 2 0 SH DA (SUTURE) ×3 IMPLANT
SUT SILK 2 0 TIES 17X18 (SUTURE)
SUT SILK 2-0 18XBRD TIE BLK (SUTURE) IMPLANT
SUT VIC AB 3-0 SH 27 (SUTURE) ×3
SUT VIC AB 3-0 SH 27X BRD (SUTURE) ×1 IMPLANT
SYR 5ML LL (SYRINGE) ×3 IMPLANT
SYR CONTROL 10ML LL (SYRINGE) ×4 IMPLANT
SYRINGE 10CC LL (SYRINGE) IMPLANT
TOWEL OR 17X24 6PK STRL BLUE (TOWEL DISPOSABLE) ×6 IMPLANT
TOWEL OR NON WOVEN STRL DISP B (DISPOSABLE) ×1 IMPLANT
TUBE CONNECTING 20'X1/4 (TUBING)
TUBE CONNECTING 20X1/4 (TUBING) IMPLANT
YANKAUER SUCT BULB TIP NO VENT (SUCTIONS) IMPLANT

## 2015-11-01 NOTE — Transfer of Care (Signed)
Immediate Anesthesia Transfer of Care Note  Patient: [name]  Procedure(s) Performed: Procedure(s): INSERTION PORT-A-CATH (Left)  Patient Location: PACU  Anesthesia Type:General  Level of Consciousness: awake, alert  and oriented  Airway & Oxygen Therapy: Patient Spontanous Breathing and Patient connected to face mask oxygen  Post-op Assessment: Report given to RN and Post -op Vital signs reviewed and stable  Post vital signs: Reviewed and stable  Last Vitals:  Filed Vitals:   11/01/15 0733 11/01/15 1004  BP: 141/86   Pulse: 65 72  Temp: 36.7 C   Resp: 18 10    Complications: No apparent anesthesia complications

## 2015-11-01 NOTE — Op Note (Signed)
11/01/2015  10:01 AM  PATIENT:  [name]  55 y.o. female  PRE-OPERATIVE DIAGNOSIS:  RIGHT BREAST CANCER  POST-OPERATIVE DIAGNOSIS:  RIGHT BREAST CANCER  PROCEDURE:  Procedure(s): INSERTION PORT-A-CATH (Left)  SURGEON:  Surgeon(s) and Role:    * Jovita Kussmaul, MD - Primary  PHYSICIAN ASSISTANT:   ASSISTANTS: none   ANESTHESIA:   general  EBL:  Total I/O In: 1000 [I.V.:1000] Out: -   BLOOD ADMINISTERED:none  DRAINS: none   LOCAL MEDICATIONS USED:  MARCAINE     SPECIMEN:  No Specimen  DISPOSITION OF SPECIMEN:  N/A  COUNTS:  YES  TOURNIQUET:  * No tourniquets in log *  DICTATION: .Dragon Dictation   After informed consent was obtained the patient was brought to the operating room and placed in the supine position on the operating room table. After adequate induction of general anesthesia  A roll was placed between the patient's shoulder blades to extend the shoulder slightly. The left chest wall and neck area were then prepped with ChloraPrep, allowed to dry, and draped in usual sterile manner. The patient was placed in Trendelenburg position. The area lateral to the bend of the clavicle on the left chest was infiltrated with quarter percent Marcaine. A small incision was made with a 15 blade knife. A large bore needle from the Port-A-Cath kit was then used to slide beneath the bend of the clavicle heading towards the  Sternal notch and in doing so we were able to access the left subclavian vein without difficulty. A wire was fed through the needle using the Seldinger technique without difficulty. The wire was confirmed in the central venous system using real-time fluoroscopy. Next the incision on the left chest was extended slightly. A subcutaneous pocket was created inferior to the incision using blunt finger dissection and some sharp dissection with the electrocautery. Next the tubing was placed on the reservoir. The reservoir was placed in the pocket in the length of  the tubing was again estimated using real-time fluoroscopy. The tubing was then cut to the appropriate length. Next the sheath and dilator were fed over the wire using the Seldinger technique without difficulty. The dilator and wire were removed. The tubing was fed through the sheath as far as it would go and then held in place while the sheath was gently cracked and separated. Another real-time fluoroscopy image showed the tip of the catheter to be in the distal superior vena cava. Next the tubing was permanently anchored to the reservoir. The reservoir was then anchored in the pocket with 2 2-0 Prolene stitches. The port was aspirated and it aspirated blood easily. The port was then flushed initially with a dilute heparin solution and then with a more concentrated heparin solution. Next the subcutaneous tissue was closed over the port with interrupted 3-0 Vicryl stitches. The skin was then closed with running 4-0 Monocryl subcuticular stitch. Dermabond dressings were applied. The patient tolerated the procedure well. At the end of the case all needle sponge and instrument counts were correct. The patient was then awakened and taken to recovery in stable condition.  PLAN OF CARE: Discharge to home after PACU  PATIENT DISPOSITION:  PACU - hemodynamically stable.   Delay start of Pharmacological VTE agent (>24hrs) due to surgical blood loss or risk of bleeding: not applicable

## 2015-11-01 NOTE — Anesthesia Postprocedure Evaluation (Signed)
Anesthesia Post Note  Patient: [name]  Procedure(s) Performed: Procedure(s) (LRB): INSERTION PORT-A-CATH (Left)  Patient location during evaluation: PACU Anesthesia Type: General Level of consciousness: awake and alert Pain management: pain level controlled Vital Signs Assessment: post-procedure vital signs reviewed and stable Respiratory status: spontaneous breathing, nonlabored ventilation, respiratory function stable and patient connected to nasal cannula oxygen Cardiovascular status: blood pressure returned to baseline and stable Postop Assessment: no signs of nausea or vomiting Anesthetic complications: no    Last Vitals:  Filed Vitals:   11/01/15 1045 11/01/15 1100  BP: 149/87 146/88  Pulse: 64 63  Temp:  36.6 C  Resp: 19 18    Last Pain:  Filed Vitals:   11/01/15 1116  PainSc: 2                  Waneda Klammer S

## 2015-11-01 NOTE — H&P (Signed)
  [name]  Location: [facility] Surgery Patient #: [id number] DOB: Feb 20, 1961 Married / Language: English / Race: Black or African American Female   History of Present Illness  Patient words: post-op masty.  The patient is a 55 year old female who presents for a follow-up for Breast cancer. The patient is a 55 year old black female who is 2 weeks status post right modified radical mastectomy for a T2 N0 right breast cancer. She is ER and PR positive and HER-2 negative with a Ki-67 10%. She denies any significant pain. Her remaining drain is putting out minimal amounts of fluid.   Allergies  Codeine Sulfate *ANALGESICS - OPIOID* Sulfo Lo *DERMATOLOGICALS* Latex  Medication History  Elavil (25MG Tablet, Oral) Active. Norvasc (5MG Tablet, Oral) Active. Azithromycin (250MG Tablet, Oral) Active. Lisinopril (10MG Tablet, Oral) Active. MetFORMIN HCl (500MG Tablet, Oral) Active. Methadone HCl (10MG Tablet, Oral) Active. Metoprolol Succinate (50MG Tablet ER, Oral) Active. PriLOSEC (40MG Capsule DR, Oral) Active. Potassium Chloride (10MEQ Tablet ER, Oral) Active. Restoril (30MG Capsule, Oral) Active. Medications Reconciled  Vitals  Weight: 258.4 lb Height: 67in Body Surface Area: 2.25 m Body Mass Index: 40.47 kg/m  Temp.: 98.60F(Oral)  Pulse: 86 (Regular)  BP: 138/82 (Sitting, Left Arm, Standard)       Physical Exam  Breast Note: The right mastectomy incision is healing nicely with no sign of infection or seroma. The skin flaps are healthy. The remaining drain was removed today and she tolerated this well     Assessment & Plan  BREAST CANCER OF LOWER-OUTER QUADRANT OF RIGHT FEMALE BREAST (C50.511) Impression: The patient is 2 weeks status post right mastectomy for breast cancer. Her last drain was removed today and she tolerated this well. I will plan to see her back in 1 week to check her progress. She may begin showering  tomorrow.  Plan for port. Risks and benefits of the surgery discussed with the patient and she understands and wishes to proceed  Signed by Luella Cook, MD

## 2015-11-01 NOTE — Interval H&P Note (Signed)
History and Physical Interval Note:  11/01/2015 8:43 AM  [name]  has presented today for surgery, with the diagnosis of RIGHT BREAST CANCER  The various methods of treatment have been discussed with the patient and family. After consideration of risks, benefits and other options for treatment, the patient has consented to  Procedure(s): INSERTION PORT-A-CATH (N/A) as a surgical intervention .  The patient's history has been reviewed, patient examined, no change in status, stable for surgery.  I have reviewed the patient's chart and labs.  Questions were answered to the patient's satisfaction.     TOTH III,PAUL S

## 2015-11-01 NOTE — Anesthesia Preprocedure Evaluation (Signed)
Anesthesia Evaluation  Patient identified by MRN, date of birth, ID band Patient awake    Reviewed: Allergy & Precautions, NPO status , Patient's Chart, lab work & pertinent test results  Airway Mallampati: II  TM Distance: >3 FB Neck ROM: Full    Dental no notable dental hx.    Pulmonary Current Smoker,    Pulmonary exam normal breath sounds clear to auscultation       Cardiovascular hypertension, Pt. on medications Normal cardiovascular exam Rhythm:Regular Rate:Normal     Neuro/Psych negative neurological ROS  negative psych ROS   GI/Hepatic Neg liver ROS, GERD  Medicated,  Endo/Other  diabetesMorbid obesity  Renal/GU negative Renal ROS  negative genitourinary   Musculoskeletal negative musculoskeletal ROS (+)   Abdominal   Peds negative pediatric ROS (+)  Hematology negative hematology ROS (+)   Anesthesia Other Findings   Reproductive/Obstetrics negative OB ROS                             Anesthesia Physical Anesthesia Plan  ASA: III  Anesthesia Plan: General   Post-op Pain Management:    Induction: Intravenous  Airway Management Planned: LMA  Additional Equipment:   Intra-op Plan:   Post-operative Plan: Extubation in OR  Informed Consent: I have reviewed the patients History and Physical, chart, labs and discussed the procedure including the risks, benefits and alternatives for the proposed anesthesia with the patient or authorized representative who has indicated his/her understanding and acceptance.   Dental advisory given  Plan Discussed with: CRNA and Surgeon  Anesthesia Plan Comments:         Anesthesia Quick Evaluation

## 2015-11-01 NOTE — Discharge Instructions (Signed)

## 2015-11-01 NOTE — Anesthesia Procedure Notes (Signed)
Procedure Name: LMA Insertion Date/Time: 11/01/2015 9:18 AM Performed by: Melynda Ripple D Pre-anesthesia Checklist: Patient identified, Emergency Drugs available, Suction available and Patient being monitored Patient Re-evaluated:Patient Re-evaluated prior to inductionOxygen Delivery Method: Circle System Utilized Preoxygenation: Pre-oxygenation with 100% oxygen Intubation Type: IV induction Ventilation: Mask ventilation without difficulty LMA: LMA inserted LMA Size: 4.0 Number of attempts: 1 Airway Equipment and Method: Bite block Placement Confirmation: positive ETCO2 Tube secured with: Tape Dental Injury: Teeth and Oropharynx as per pre-operative assessment

## 2015-11-02 ENCOUNTER — Encounter: Payer: Self-pay | Admitting: *Deleted

## 2015-11-02 ENCOUNTER — Encounter (HOSPITAL_BASED_OUTPATIENT_CLINIC_OR_DEPARTMENT_OTHER): Payer: Self-pay | Admitting: General Surgery

## 2015-11-02 ENCOUNTER — Other Ambulatory Visit: Payer: 59

## 2015-11-02 NOTE — Progress Notes (Signed)
Spent time with patient in chemo class and numerous friends reviewing side effects of Adriamycin,Cytoxan and Taxol, see education notes

## 2015-11-05 ENCOUNTER — Other Ambulatory Visit: Payer: Self-pay

## 2015-11-05 ENCOUNTER — Ambulatory Visit (HOSPITAL_BASED_OUTPATIENT_CLINIC_OR_DEPARTMENT_OTHER)
Admission: RE | Admit: 2015-11-05 | Discharge: 2015-11-05 | Disposition: A | Discharge status: Home / Self Care | Payer: 59 | Source: Ambulatory Visit | Attending: Internal Medicine | Admitting: Internal Medicine

## 2015-11-05 ENCOUNTER — Other Ambulatory Visit: Payer: Self-pay | Admitting: *Deleted

## 2015-11-05 ENCOUNTER — Other Ambulatory Visit (HOSPITAL_BASED_OUTPATIENT_CLINIC_OR_DEPARTMENT_OTHER): Payer: 59

## 2015-11-05 ENCOUNTER — Ambulatory Visit (HOSPITAL_BASED_OUTPATIENT_CLINIC_OR_DEPARTMENT_OTHER): Payer: 59 | Admitting: Nurse Practitioner

## 2015-11-05 ENCOUNTER — Telehealth: Payer: Self-pay | Admitting: Oncology

## 2015-11-05 ENCOUNTER — Encounter (HOSPITAL_COMMUNITY): Payer: Self-pay | Admitting: Internal Medicine

## 2015-11-05 ENCOUNTER — Encounter: Payer: Self-pay | Admitting: Nurse Practitioner

## 2015-11-05 ENCOUNTER — Ambulatory Visit (HOSPITAL_COMMUNITY)
Admission: RE | Admit: 2015-11-05 | Discharge: 2015-11-05 | Disposition: A | Discharge status: Home / Self Care | Payer: 59 | Source: Ambulatory Visit | Attending: Family Medicine | Admitting: Family Medicine

## 2015-11-05 VITALS — BP 137/74 | HR 74 | Wt 252.0 lb

## 2015-11-05 VITALS — BP 139/77 | HR 66 | Temp 97.9°F | Resp 19 | Ht 67.0 in | Wt 252.5 lb

## 2015-11-05 DIAGNOSIS — Z6839 Body mass index (BMI) 39.0-39.9, adult: Secondary | ICD-10-CM | POA: Insufficient documentation

## 2015-11-05 DIAGNOSIS — E119 Type 2 diabetes mellitus without complications: Secondary | ICD-10-CM

## 2015-11-05 DIAGNOSIS — G4700 Insomnia, unspecified: Secondary | ICD-10-CM

## 2015-11-05 DIAGNOSIS — I509 Heart failure, unspecified: Secondary | ICD-10-CM | POA: Insufficient documentation

## 2015-11-05 DIAGNOSIS — Z170 Estrogen receptor positive status [ER+]: Secondary | ICD-10-CM

## 2015-11-05 DIAGNOSIS — I10 Essential (primary) hypertension: Secondary | ICD-10-CM | POA: Diagnosis not present

## 2015-11-05 DIAGNOSIS — E669 Obesity, unspecified: Secondary | ICD-10-CM | POA: Diagnosis not present

## 2015-11-05 DIAGNOSIS — C50311 Malignant neoplasm of lower-inner quadrant of right female breast: Secondary | ICD-10-CM | POA: Insufficient documentation

## 2015-11-05 DIAGNOSIS — I517 Cardiomegaly: Secondary | ICD-10-CM | POA: Insufficient documentation

## 2015-11-05 LAB — CBC WITH DIFFERENTIAL/PLATELET
BASO%: 0.7 % (ref 0.0–2.0)
Basophils Absolute: 0.1 10*3/uL (ref 0.0–0.1)
EOS%: 7 % (ref 0.0–7.0)
Eosinophils Absolute: 0.7 10*3/uL — ABNORMAL HIGH (ref 0.0–0.5)
HCT: 39.5 % (ref 34.8–46.6)
HGB: 13.2 g/dL (ref 11.6–15.9)
LYMPH%: 29.7 % (ref 14.0–49.7)
MCH: 28.6 pg (ref 25.1–34.0)
MCHC: 33.4 g/dL (ref 31.5–36.0)
MCV: 85.7 fL (ref 79.5–101.0)
MONO#: 0.5 10*3/uL (ref 0.1–0.9)
MONO%: 5.6 % (ref 0.0–14.0)
NEUT#: 5.4 10*3/uL (ref 1.5–6.5)
NEUT%: 57 % (ref 38.4–76.8)
Platelets: 296 10*3/uL (ref 145–400)
RBC: 4.61 10*6/uL (ref 3.70–5.45)
RDW: 14.6 % — ABNORMAL HIGH (ref 11.2–14.5)
WBC: 9.4 10*3/uL (ref 3.9–10.3)
lymph#: 2.8 10*3/uL (ref 0.9–3.3)

## 2015-11-05 LAB — COMPREHENSIVE METABOLIC PANEL
ALT: 16 U/L (ref 0–55)
AST: 17 U/L (ref 5–34)
Albumin: 4.2 g/dL (ref 3.5–5.0)
Alkaline Phosphatase: 78 U/L (ref 40–150)
Anion Gap: 11 mEq/L (ref 3–11)
BUN: 11 mg/dL (ref 7.0–26.0)
CO2: 26 mEq/L (ref 22–29)
Calcium: 9.3 mg/dL (ref 8.4–10.4)
Chloride: 102 mEq/L (ref 98–109)
Creatinine: 0.8 mg/dL (ref 0.6–1.1)
EGFR: >90 mL/min/{1.73_m2} (ref >90)
Glucose: 127 mg/dl (ref 70–140)
Potassium: 3.5 mEq/L (ref 3.5–5.1)
Sodium: 139 mEq/L (ref 136–145)
Total Bilirubin: 0.48 mg/dL (ref 0.20–1.20)
Total Protein: 7.6 g/dL (ref 6.4–8.3)

## 2015-11-05 MED ORDER — ONDANSETRON HCL 8 MG PO TABS: 8.0000 mg | ORAL_TABLET | Freq: Two times a day (BID) | ORAL | Status: DC | PRN

## 2015-11-05 MED ORDER — PROCHLORPERAZINE MALEATE 10 MG PO TABS: 10.0000 mg | ORAL_TABLET | Freq: Four times a day (QID) | ORAL | Status: DC | PRN

## 2015-11-05 MED ORDER — LIDOCAINE-PRILOCAINE 2.5-2.5 % EX CREA: 1.0000 "application " | TOPICAL_CREAM | CUTANEOUS | Status: DC | PRN

## 2015-11-05 NOTE — Progress Notes (Signed)
CARDIO-ONCOLOGY CONSULT NOTE  Referring Physician: Laurey Morale, MD as PCP - Westlake III, MD as Consulting Physician (General Surgery) Chauncey Cruel, MD as Consulting Physician (Oncology) Thea Silversmith, MD as Consulting Physician (Radiation Oncology) Sylvan Cheese, NP as Nurse Practitioner (Hematology and Oncology) PCP: Laurey Morale, MD OTHER MD: Mardi Mainland MD, Nicholaus Bloom MD  HPI:  [name] is a 55 year old with history of morbid obesity, severe osteoarthritis s/r R and L TKR, HTN, and DM who is referred by Dr. Jana Hakim for enrollment into the cardio-oncology clinic.  Oncology history:   (1) status post right breast biopsy 3 and axillary lymph node biopsy 08/02/2015 for a clinical mpT1c N0, s invasive ductal carcinoma, grade 1 or 2, estrogen receptor 95% positive, progesterone receptor 5% positive, with an MIB-1 between 5 and 10%, and HER-2 equivocal on 1 of the 2 biopsies (signals ratio 1.30, number per cell 4.0).tage IA   (2) status post right modified radical mastectomy 10/01/2015 for an mpT2 pN0, stage IIA invasive ductal carcinoma, grade 2, with a total of 20 benign lymph nodes removed  (3) Oncotype DX score of 32 predicts an outside the breast recurrence risk of 22% within 10 years if the patient's only systemic therapy is tamoxifen for 5 years. It also predicts significant benefit from adjuvant chemotherapy.  (4) patient to receive adjuvant doxorubicin and cyclophosphamide 4, be followed as tolerated by paclitaxel weekly 12 (a) consider PREVENT study will her  (4) postmastectomy radiation to follow as appropriate  (5) anti-estrogens to follow completion of local treatment (a) consider PALLAS Study (b) consider BWEL study    She denies any h/o cardiac disease. She struggles with arthritis. Denies SOB/PND/orthopnea. Able to walk with a cane.   Echo reviewed personally today: EF 65% Grade I  DD. RV normal. Lateral s' 10.8 cm/s GLS -18.2%    Review of Systems: [y] = yes, [ ]  = no   General: Weight gain [ ] ; Weight loss [ ] ; Anorexia [ ] ; Fatigue [ ] ; Fever [ ] ; Chills [ ] ; Weakness [ ]   Cardiac: Chest pain/pressure [ ] ; Resting SOB [ ] ; Exertional SOB [ ] ; Orthopnea [ ] ; Pedal Edema [ ] ; Palpitations [ ] ; Syncope [ ] ; Presyncope [ ] ; Paroxysmal nocturnal dyspnea[ ]   Pulmonary: Cough [ ] ; Wheezing[ ] ; Hemoptysis[ ] ; Sputum [ ] ; Snoring [ ]   GI: Vomiting[ ] ; Dysphagia[ ] ; Melena[ ] ; Hematochezia [ ] ; Heartburn[ ] ; Abdominal pain [ ] ; Constipation [ ] ; Diarrhea [ ] ; BRBPR [ ]   GU: Hematuria[ ] ; Dysuria [ ] ; Nocturia[ ]   Vascular: Pain in legs with walking [ ] ; Pain in feet with lying flat [ ] ; Non-healing sores [ ] ; Stroke [ ] ; TIA [ ] ; Slurred speech [ ] ;  Neuro: Headaches[ ] ; Vertigo[ ] ; Seizures[ ] ; Paresthesias[ ] ;Blurred vision [ ] ; Diplopia [ ] ; Vision changes [ ]   Ortho/Skin: Arthritis [Y ]; Joint pain [Y ]; Muscle pain [ ] ; Joint swelling [ ] ; Back Pain [ ] ; Rash [ ]   Psych: Depression[ ] ; Anxiety[ ]   Heme: Bleeding problems [ ] ; Clotting disorders [ ] ; Anemia [ ]   Endocrine: Diabetes [Y ]; Thyroid dysfunction[ ]    Past Medical History  Diagnosis Date  . Osteoarthritis   . Headache(784.0)     migraine like per patient  . Hypertension   . Low back pain     dr phillips, pain management  . Degenerative joint disease   . Gallstones   . GERD (gastroesophageal  reflux disease)   . DDD (degenerative disc disease), cervical   . Colon polyps   . Diverticulosis   . Diabetes mellitus without complication ([place])   . Breast cancer of lower-inner quadrant of right female breast ([place]) 08/08/2015  . Breast cancer [facility])     Current Outpatient Prescriptions  Medication Sig Dispense Refill  . amitriptyline (ELAVIL) 25 MG tablet Take 25 mg by mouth at bedtime.     Marland Kitchen amLODipine (NORVASC) 5 MG tablet Take 1 tablet (5 mg total) by mouth 2 (two) times daily. 180 tablet 3  .  furosemide (LASIX) 20 MG tablet TAKE 1 TABLET BY MOUTH EVERY DAY 30 tablet 2  . lisinopril (PRINIVIL,ZESTRIL) 10 MG tablet Take 1 tablet (10 mg total) by mouth daily. 90 tablet 3  . metFORMIN (GLUCOPHAGE) 500 MG tablet Take 1 tablet (500 mg total) by mouth 2 (two) times daily with a meal. 180 tablet 3  . methadone (DOLOPHINE) 10 MG tablet Take 10 mg by mouth every 8 (eight) hours as needed for moderate pain.     . metoprolol (LOPRESSOR) 50 MG tablet Take 100 mg by mouth 2 (two) times daily.    Marland Kitchen omeprazole (PRILOSEC) 40 MG capsule Take 1 capsule (40 mg total) by mouth daily. 90 capsule 3  . potassium chloride (K-DUR) 10 MEQ tablet Take 3 tablets (30 mEq total) by mouth 2 (two) times daily. 180 tablet 11  . temazepam (RESTORIL) 30 MG capsule TAKE ONE CAPSULE BY MOUTH AT BEDTIME AS NEEDED FOR SLEEP 90 capsule 1  . ondansetron (ZOFRAN) 8 MG tablet Take 1 tablet (8 mg total) by mouth 2 (two) times daily as needed. Start on the third day after chemotherapy. (Patient not taking: Reported on 11/05/2015) 30 tablet 1  . prochlorperazine (COMPAZINE) 10 MG tablet Take 1 tablet (10 mg total) by mouth every 6 (six) hours as needed (Nausea or vomiting). (Patient not taking: Reported on 11/05/2015) 30 tablet 1   No current facility-administered medications for this encounter.    Allergies  Allergen Reactions  . Codeine Itching and Nausea And Vomiting    Itching all over the body  . Latex Hives  . Sulfonamide Derivatives Hives    All over the body      Social History   Social History  . Marital Status: Married    Spouse Name: N/A  . Number of Children: 3  . Years of Education: N/A   Occupational History  . retired/disabled    Social History Main Topics  . Smoking status: Current Every Day Smoker -- 0.25 packs/day for 20 years    Types: Cigarettes  . Smokeless tobacco: Former Systems developer    Quit date: 09/30/2015     Comment: quit  . Alcohol Use: No  . Drug Use: No  . Sexual Activity: Yes    Birth  Control/ Protection: Post-menopausal   Other Topics Concern  . Not on file   Social History Narrative      Family History  Problem Relation Age of Onset  . Crohn's disease Other     nephew  . Stomach cancer Mother   . Heart disease Father   . Prostate cancer Father   . Kidney cancer Sister     one out of the four  . Hypertension Sister   . Hypertension Brother   . Colon cancer Maternal Grandmother   FH: Brother Heart Failure/Amyloid, Sister HF , Dad deceased from Heart Failure   Filed Vitals:   11/05/15 1413  BP:  137/74  Pulse: 74  Weight: 252 lb (114.306 kg)  SpO2: 98%    PHYSICAL EXAM: General:  Well appearing. No respiratory difficulty HEENT: normal Neck: supple. no JVD. Carotids 2+ bilat; no bruits. No lymphadenopathy or thryomegaly appreciated. Cor: PMI nondisplaced. Regular rate & rhythm. No rubs, gallops or murmurs. Lungs: clear Abdomen: obese. soft, nontender, nondistended. No hepatosplenomegaly. No bruits or masses. Good bowel sounds. Extremities: no cyanosis, clubbing, rash, R and LLE trace edema. R and L knee scar.  Neuro: alert & oriented x 3, cranial nerves grossly intact. moves all 4 extremities w/o difficulty. Affect pleasant.   ASSESSMENT & PLAN:  1. R Breast Cancer. S/P R Mastectomy -mpT2 pN0, stage IIA invasive ductal carcinoma, grade 2, with a total of 20 benign lymph nodes removed. Plan for adjuvant doxorubicin and cyclophosphamide 4, be followed as tolerated by paclitaxel weekly 12.  Today Dr Haroldine Laws discussed and reviewed ECHO. Plan to follow ECHOs every 3 months Explained that she is at risk for cardio-toxicity related to doxrubicin.  2. HTN  Stable. On lisinopril and lopressor.   Follow up in 3 months with an ECHO.    Amy Clegg NP-C  3:29 PM   Patient seen and examined with Darrick Grinder, NP. We discussed all aspects of the encounter. I agree with the assessment and plan as stated above.   Explained incidence and dose-dependent nature  of Adriamycin cardiotoxicity.A lso discussed fact that toxicity can occasionally be delayed and show up years down the road. I explained the role of Cardio-oncology clinic at length. Echo images reviewed personally. All parameters stable. Reviewed signs and symptoms of HF to look for.  Will see back in 3 months with post adriamycin echo.  Jannifer Fischler,MD 9:01 PM

## 2015-11-05 NOTE — Progress Notes (Signed)
  Echocardiogram 2D Echocardiogram has been performed.  Bobbye Charleston 11/05/2015, 1:58 PM

## 2015-11-05 NOTE — Telephone Encounter (Signed)
Gave patient avs report and appointments for February and March.  °

## 2015-11-05 NOTE — Progress Notes (Signed)
[facility] [facility]  [phone] [phone]     ID: [name] DOB: [birth year]  MR#: [id number]  OMB#:[id number]  Patient Care Team: Laurey Morale, MD as PCP - [place] III, MD as Consulting Physician (General Surgery) Chauncey Cruel, MD as Consulting Physician (Oncology) Thea Silversmith, MD as Consulting Physician (Radiation Oncology) Sylvan Cheese, NP as Nurse Practitioner (Hematology and Oncology) PCP: Laurey Morale, MD GYN: OTHER MD: Mardi Mainland MD, Nicholaus Bloom MD  CHIEF COMPLAINT: Multicentric breast cancer  CURRENT TREATMENT: Adjuvant chemotherapy pending   BREAST CANCER HISTORY: From the original intake note:  [name] herself noted a change in her right breast in October and brought it to the attention of her primary care physician. Her last mammogram had been April 2010. Dr. Sarajane Jews set [name] up for bilateral diagnostic mammography with tomosynthesis and [facility] 04/11/2015. This found the breast density to be category B. In the right breast there were 3 microlobulated masses in the lower inner quadrant measuring 1.9, 1.5, and 1.5 cm. These were multicentric. There were also diffuse fine pleomorphic calcifications surrounding these masses, that area spanning 11.6 cm. The masses were palpable by exam at 4:00 8 cm from the nipple and at 5:00 3 cm from the nipple. By ultrasonography there was an irregular hypoechoic mass in the right breast at 4:00 measuring 1.7 cm, 1 medial to this measuring 1.1 cm, and then the third irregular hypoechoic mass at the 5:00 position measuring 1.3 cm. The right axilla showed multiple lymph nodes with thickened cortices. The largest lymph node measured 0.9 cm.  On 08/02/2015, [name] underwent biopsy of all 3 breast masses as well as a right axillary lymph node. 2 of the tumors were similar invasive ductal carcinomas, grade 2, estrogen receptor  95% positive, progesterone receptor 5% positive, with an MIB-1 of 10%, and HER-2 equivocal, with a signals ratio of 1.30 and the number per cell 4.0. The third tumor appeared's grade 1 or 2 was also estrogen receptor positive at 95%, progesterone receptor positive at 5%, with an MIP-1 of 5%, and a similar HER-2 profile the signals ratio being 1.46 but the number per cell being only 3.95. By immunohistochemistry on this tumor HER-2 was negative at 1+. Immunohistochemistry on the equivocal reading is pending.  [name]'s subsequent history is as detailed below  INTERVAL HISTORY: [name] returns today for follow-up of her breast cancer. She had a left chest portacath placed last week. This is healing well. She is here to learn about her antiemetic schedule for cyclophosphamide and doxorubicin, which she is to start tomorrow.   REVIEW OF SYSTEMS: [name] continues to have significant back and joint pain.She continues to see Dr. Hardin Negus for her pain management and that has been very stable. She has hot flashes but not night sweats. Her blood sugars are well-controlled. She has rare tingling to her bilateral hands. She uses restoril and elavil nightly for sleep. Aside from these issues a detailed review of systems today was stable  PAST MEDICAL HISTORY: Past Medical History  Diagnosis Date  . Osteoarthritis   . Headache(784.0)     migraine like per patient  . Hypertension   . Low back pain     dr phillips, pain management  . Degenerative joint disease   . Gallstones   . GERD (gastroesophageal reflux disease)   . DDD (degenerative disc disease), cervical   . Colon polyps   . Diverticulosis   .  Diabetes mellitus without complication ([place])   . Breast cancer of lower-inner quadrant of right female breast ([facility]) 08/08/2015  . Breast cancer ([place])     PAST SURGICAL HISTORY: Past Surgical History  Procedure Laterality Date  . Replacement total knee Right     right - dr Novella Olive  . Ganglion cyst  excision Right     right wrist  . Lipoma excision Right 02/21/08    right hip, [facility]  . Replacement total knee Left 06/25/09    left - dr Marciano Sequin [facility]  . Tonsillectomy    . Joint replacement      both knees have been done, four times each per patient. Her body rejects the implant, no infection.  . Abdominal hysterectomy  06/22/06    lap, dr Mancel Bale  . Other surgical history      lipoma removed from back 2012   . Cholecystectomy  10/28/2011    Procedure: LAPAROSCOPIC CHOLECYSTECTOMY WITH INTRAOPERATIVE CHOLANGIOGRAM;  Surgeon: Earnstine Regal, MD;  Location: [facility];  Service: General;  Laterality: N/A;  . Mass excision  08/24/2012    Procedure: EXCISION MASS;  Surgeon: Earnstine Regal, MD;  Location: [facility];  Service: General;  Laterality: Left;  excisie soft tissue masses left shoulder(back) & left upper arm  . Colonoscopy  02-03-13    per Dr. Henrene Pastor, diverticuli but no polyps, repeat in 5 yrs   . Mass excision Left 01/24/2014    Procedure: EXCISION SOFT TISSUE MASSES LOWER LEFT BACK;  Surgeon: Earnstine Regal, MD;  Location: [facility];  Service: General;  Laterality: Left;  . Esophagogastroduodenoscopy  02-03-13    per Dr. Henrene Pastor, normal   . Cardiac catheterization      back in 200 by Dr. Stanford Breed  . Tonsillectomy    . Mastectomy w/ sentinel node biopsy Right 10/02/2015  . Mastectomy w/ sentinel node biopsy Right 10/01/2015    Procedure: RIGHT MASTECTOMY WITH SENTINEL LYMPH NODE BIOPSY;  Surgeon: Autumn Messing III, MD;  Location: [facility];  Service: General;  Laterality: Right;  . Portacath placement Left 11/01/2015    Procedure: INSERTION PORT-A-CATH;  Surgeon: Autumn Messing III, MD;  Location: [facility];  Service: General;  Laterality: Left;    FAMILY HISTORY Family History  Problem Relation Age of Onset  . Crohn's disease Other     nephew  . Stomach cancer Mother   . Heart disease Father   . Prostate cancer Father   . Kidney cancer Sister     one out  of the four  . Hypertension Sister   . Hypertension Brother   . Colon cancer Maternal Grandmother   The patient's father died at the age of 74 with congestive heart failure. Her mother is still living as of November 2016, age 36. The patient had 6 brothers, 4 sisters. One sister was diagnosed with kidney cancer at age 72. ([place]). She is doing well. The patient's maternal grandmother was diagnosed with colon cancer at the age of 17. The patient's mother was diagnosed with a "rare stomach cancer" 20 years ago. The patient's father was diagnosed with prostate cancer at age 2. There is no history of breast or ovarian cancer in the family to her knowledge.   GYNECOLOGIC HISTORY:  No LMP recorded. Patient has had a hysterectomy.  menarche age 41, first live birth age 17, the patient is GX P3. She underwent remote hysterectomy with bilateral salpingo-oophorectomy. She did not use hormone replacement. She  never used oral contraceptives.   SOCIAL HISTORY:  [name] used to work as a "[employer] but she is now disabled because of her multiple arthritic and degenerative problems. Her husband [name] works as a Freight forwarder. Son [name] works in long care in [place]. Son [name] works for a Arboriculturist in [place]. Daughter  [name] is a Haematologist in [place]. The patient has 5 grandchildren. She attends a Physicist, medical church    ADVANCED DIRECTIVES: Not in place   HEALTH MAINTENANCE: Social History  Substance Use Topics  . Smoking status: Current Every Day Smoker -- 0.25 packs/day for 20 years    Types: Cigarettes  . Smokeless tobacco: Former Systems developer    Quit date: 09/30/2015     Comment: quit  . Alcohol Use: No     Colonoscopy:February 2016   LKG:MWNUUV post hysterectomy   Bone density:  Lipid panel:  Allergies  Allergen Reactions  . Codeine Itching and Nausea And Vomiting    Itching all over the body  . Latex Hives  .  Sulfonamide Derivatives Hives    All over the body    Current Outpatient Prescriptions  Medication Sig Dispense Refill  . amitriptyline (ELAVIL) 25 MG tablet Take 25 mg by mouth at bedtime.     Marland Kitchen amLODipine (NORVASC) 5 MG tablet Take 1 tablet (5 mg total) by mouth 2 (two) times daily. (Patient taking differently: Take 10 mg by mouth 2 (two) times daily. ) 180 tablet 3  . furosemide (LASIX) 20 MG tablet TAKE 1 TABLET BY MOUTH EVERY DAY 30 tablet 2  . lisinopril (PRINIVIL,ZESTRIL) 10 MG tablet Take 1 tablet (10 mg total) by mouth daily. (Patient taking differently: Take 20 mg by mouth daily. ) 90 tablet 3  . metFORMIN (GLUCOPHAGE) 500 MG tablet Take 1 tablet (500 mg total) by mouth 2 (two) times daily with a meal. 180 tablet 3  . methadone (DOLOPHINE) 10 MG tablet Take 10 mg by mouth every 8 (eight) hours as needed for moderate pain.     . metoprolol (LOPRESSOR) 50 MG tablet Take 100 mg by mouth 2 (two) times daily.    Marland Kitchen omeprazole (PRILOSEC) 40 MG capsule Take 1 capsule (40 mg total) by mouth daily. 90 capsule 3  . potassium chloride (K-DUR) 10 MEQ tablet Take 3 tablets (30 mEq total) by mouth 2 (two) times daily. (Patient taking differently: Take 20 mEq by mouth daily. ) 180 tablet 11  . temazepam (RESTORIL) 30 MG capsule TAKE ONE CAPSULE BY MOUTH AT BEDTIME AS NEEDED FOR SLEEP 90 capsule 1  . ondansetron (ZOFRAN) 8 MG tablet Take 1 tablet (8 mg total) by mouth 2 (two) times daily as needed. Start on the third day after chemotherapy. 30 tablet 1  . oxyCODONE (ROXICODONE) 15 MG immediate release tablet Take 15 mg by mouth every 4 (four) hours as needed for pain. Reported on 11/05/2015    . prochlorperazine (COMPAZINE) 10 MG tablet Take 1 tablet (10 mg total) by mouth every 6 (six) hours as needed (Nausea or vomiting). 30 tablet 1   No current facility-administered medications for this visit.    OBJECTIVE: middle-aged African-American woman in no acute distress Filed Vitals:   11/05/15 0943    BP: 139/77  Pulse: 66  Temp: 97.9 F (36.6 C)  Resp: 19     Body mass index is 39.54 kg/(m^2).    ECOG FS:2 - Symptomatic, <50% confined to bed  Skin: warm, dry  HEENT:  sclerae anicteric, conjunctivae pink, oropharynx clear. No thrush or mucositis.  Lymph Nodes: No cervical or supraclavicular lymphadenopathy  Lungs: clear to auscultation bilaterally, no rales, wheezes, or rhonci  Heart: regular rate and rhythm  Abdomen: round, soft, non tender, positive bowel sounds  Musculoskeletal: No focal spinal tenderness, no peripheral edema  Neuro: non focal, well oriented, positive affect  Breasts: deferred. Newly placed left chest portacath, healing well  LAB RESULTS:  CMP     Component Value Date/Time   NA 139 11/05/2015 0932   NA 143 10/30/2015 1535   K 3.5 11/05/2015 0932   K 3.4* 10/30/2015 1535   CL 101 10/30/2015 1535   CO2 26 11/05/2015 0932   CO2 27 10/30/2015 1535   GLUCOSE 127 11/05/2015 0932   GLUCOSE 84 10/30/2015 1535   BUN 11.0 11/05/2015 0932   BUN 8 10/30/2015 1535   CREATININE 0.8 11/05/2015 0932   CREATININE 0.79 10/30/2015 1535   CALCIUM 9.3 11/05/2015 0932   CALCIUM 9.7 10/30/2015 1535   PROT 7.6 11/05/2015 0932   PROT 7.7 06/05/2015 1837   ALBUMIN 4.2 11/05/2015 0932   ALBUMIN 4.8 06/05/2015 1837   AST 17 11/05/2015 0932   AST 24 06/05/2015 1837   ALT 16 11/05/2015 0932   ALT 14 06/05/2015 1837   ALKPHOS 78 11/05/2015 0932   ALKPHOS 69 06/05/2015 1837   BILITOT 0.48 11/05/2015 0932   BILITOT 0.5 06/05/2015 1837   GFRNONAA >60 10/30/2015 1535   GFRAA >60 10/30/2015 1535    INo results found for: SPEP, UPEP  Lab Results  Component Value Date   WBC 9.4 11/05/2015   NEUTROABS 5.4 11/05/2015   HGB 13.2 11/05/2015   HCT 39.5 11/05/2015   MCV 85.7 11/05/2015   PLT 296 11/05/2015      Chemistry      Component Value Date/Time   NA 139 11/05/2015 0932   NA 143 10/30/2015 1535   K 3.5 11/05/2015 0932   K 3.4* 10/30/2015 1535   CL 101  10/30/2015 1535   CO2 26 11/05/2015 0932   CO2 27 10/30/2015 1535   BUN 11.0 11/05/2015 0932   BUN 8 10/30/2015 1535   CREATININE 0.8 11/05/2015 0932   CREATININE 0.79 10/30/2015 1535      Component Value Date/Time   CALCIUM 9.3 11/05/2015 0932   CALCIUM 9.7 10/30/2015 1535   ALKPHOS 78 11/05/2015 0932   ALKPHOS 69 06/05/2015 1837   AST 17 11/05/2015 0932   AST 24 06/05/2015 1837   ALT 16 11/05/2015 0932   ALT 14 06/05/2015 1837   BILITOT 0.48 11/05/2015 0932   BILITOT 0.5 06/05/2015 1837       No results found for: LABCA2  No components found for: LABCA125  No results for input(s): INR in the last 168 hours.  Urinalysis    Component Value Date/Time   COLORURINE YELLOW 06/05/2015 2353   APPEARANCEUR CLOUDY* 06/05/2015 2353   LABSPEC 1.007 06/05/2015 2353   PHURINE 6.5 06/05/2015 2353   GLUCOSEU NEGATIVE 06/05/2015 2353   HGBUR TRACE* 06/05/2015 2353   HGBUR large 07/01/2010 0945   BILIRUBINUR NEGATIVE 06/05/2015 2353   BILIRUBINUR n 02/26/2015 1121   KETONESUR NEGATIVE 06/05/2015 2353   PROTEINUR NEGATIVE 06/05/2015 2353   PROTEINUR n 02/26/2015 1121   UROBILINOGEN 0.2 06/05/2015 2353   UROBILINOGEN 0.2 02/26/2015 1121   NITRITE NEGATIVE 06/05/2015 2353   NITRITE n 02/26/2015 1121   LEUKOCYTESUR NEGATIVE 06/05/2015 2353    STUDIES: Dg Chest Port 1 [address]  11/01/2015  CLINICAL DATA:  Post Port-A-Cath placement EXAM: PORTABLE CHEST 1 VIEW COMPARISON:  06/05/2015 chest radiograph. FINDINGS: Left subclavian Port-A-Cath terminates in the lower third of the superior vena cava. Right axillary surgical clips are noted Stable cardiomediastinal silhouette with top-normal heart size. No pneumothorax. No pleural effusion. No pulmonary edema. Mild left basilar atelectasis. IMPRESSION: 1. Left subclavian Port-A-Cath terminates in the lower third of the superior vena cava. No pneumothorax. 2. Mild left basilar atelectasis. Electronically Signed   By: Ilona Sorrel M.D.   On:  11/01/2015 11:01   Dg Fluoro Guide Cv Line-no Report  11/01/2015  CLINICAL DATA:  FLOURO GUIDE CV LINE Fluoroscopy was utilized by the requesting physician.  No radiographic interpretation.    ASSESSMENT: 55 y.o. [place] woman status post right breast biopsy 3 and axillary lymph node biopsy 08/02/2015 for a clinical mpT1c N0, s invasive ductal carcinoma, grade 1 or 2, estrogen receptor 95% positive, progesterone receptor 5% positive, with an MIB-1 between 5 and 10%, and HER-2 equivocal on 1 of the 2 biopsies (signals ratio 1.30, number per cell 4.0).tage IA   (1) status post right modified radical mastectomy 10/01/2015 for an mpT2 pN0, stage IIA invasive ductal carcinoma, grade 2, with a total of 20 benign lymph nodes removed  (2) Oncotype DX score of 32 predicts an outside the breast recurrence risk of 22% within 10 years if the patient's only systemic therapy is tamoxifen for 5 years. It also predicts significant benefit from adjuvant chemotherapy.  (3) patient to receive adjuvant doxorubicin and cyclophosphamide 4, be followed as tolerated by paclitaxel weekly 12  (a) consider PREVENT study will her  (4) postmastectomy radiation to follow as appropriate  (5) anti-estrogens to follow completion of local treatment  (a) consider PALLAS  Study  (b) consider BWEL study  PLAN: Kannon and I spent 40 minutes discussing her upcoming chemotherapy plans. She was made aware of potential side effects and toxicities of cyclophosphamide and doxorubicin. She attended chemotherapy school last week so a lot of this information was a review for her. She was made aware of her neulasta injections, including the fact that these will be administered with an onbody injector that she will need to wear until the device is no longer active.   We reviewed her antiemetic schedule, and she feels comfortable with every medication listed as well as the indication and potential side effects. These medicines were sent  to her pharmacy today. I did not prescribe the dexamethasone to avoid steroid use in her prediabetic condition. I am also leaving off lorazepam as she has several other sleep aids in use.   I have given her a dispensing order for post mastectomy supplies to be used at [facility].   Sera will return tomorrow for her first cycle of treatment. She understands and agrees with this plan. She knows the goal of treatment in her case is cure. She has been encouraged to call with any issues that might arise before her next visit here.   Laurie Panda, NP   11/05/2015 11:06 AM

## 2015-11-05 NOTE — Patient Instructions (Signed)
Follow up and Echocardiogram in 3 months with Dr.Bensimhon

## 2015-11-06 ENCOUNTER — Ambulatory Visit (HOSPITAL_BASED_OUTPATIENT_CLINIC_OR_DEPARTMENT_OTHER): Payer: 59

## 2015-11-06 ENCOUNTER — Other Ambulatory Visit: Payer: Self-pay | Admitting: Oncology

## 2015-11-06 ENCOUNTER — Encounter: Payer: Self-pay | Admitting: *Deleted

## 2015-11-06 VITALS — BP 134/81 | HR 65 | Temp 98.0°F | Resp 18

## 2015-11-06 DIAGNOSIS — Z5111 Encounter for antineoplastic chemotherapy: Secondary | ICD-10-CM

## 2015-11-06 DIAGNOSIS — C50311 Malignant neoplasm of lower-inner quadrant of right female breast: Secondary | ICD-10-CM

## 2015-11-06 MED ORDER — PALONOSETRON HCL INJECTION 0.25 MG/5ML
INTRAVENOUS | Status: AC
  Filled 2015-11-06: qty 5

## 2015-11-06 MED ORDER — HEPARIN SOD (PORK) LOCK FLUSH 100 UNIT/ML IV SOLN
500.0000 [IU] | Freq: Once | INTRAVENOUS | Status: AC | PRN
  Administered 2015-11-06: 500 [IU]
  Filled 2015-11-06: qty 5

## 2015-11-06 MED ORDER — DOXORUBICIN HCL CHEMO IV INJECTION 2 MG/ML
60.0000 mg/m2 | Freq: Once | INTRAVENOUS | Status: AC
  Administered 2015-11-06: 140 mg via INTRAVENOUS
  Filled 2015-11-06: qty 70

## 2015-11-06 MED ORDER — SODIUM CHLORIDE 0.9 % IV SOLN
Freq: Once | INTRAVENOUS | Status: AC
  Administered 2015-11-06: 08:00:00 via INTRAVENOUS

## 2015-11-06 MED ORDER — SODIUM CHLORIDE 0.9 % IV SOLN
600.0000 mg/m2 | Freq: Once | INTRAVENOUS | Status: AC
  Administered 2015-11-06: 1400 mg via INTRAVENOUS
  Filled 2015-11-06: qty 70

## 2015-11-06 MED ORDER — PALONOSETRON HCL INJECTION 0.25 MG/5ML
0.2500 mg | Freq: Once | INTRAVENOUS | Status: AC
  Administered 2015-11-06: 0.25 mg via INTRAVENOUS

## 2015-11-06 MED ORDER — PEGFILGRASTIM 6 MG/0.6ML ~~LOC~~ PSKT
6.0000 mg | PREFILLED_SYRINGE | Freq: Once | SUBCUTANEOUS | Status: AC
  Administered 2015-11-06: 6 mg via SUBCUTANEOUS
  Filled 2015-11-06: qty 0.6

## 2015-11-06 MED ORDER — SODIUM CHLORIDE 0.9 % IV SOLN
Freq: Once | INTRAVENOUS | Status: DC
Start: 2015-11-06 — End: 2015-11-06
  Filled 2015-11-06: qty 5

## 2015-11-06 MED ORDER — SODIUM CHLORIDE 0.9% FLUSH
10.0000 mL | INTRAVENOUS | Status: DC | PRN
  Administered 2015-11-06: 10 mL
  Filled 2015-11-06: qty 10

## 2015-11-06 MED ORDER — FOSAPREPITANT DIMEGLUMINE INJECTION 150 MG
Freq: Once | INTRAVENOUS | Status: AC
  Administered 2015-11-06: 08:00:00 via INTRAVENOUS
  Filled 2015-11-06: qty 5

## 2015-11-06 NOTE — Patient Instructions (Addendum)
[facility] Discharge Instructions for Patients Receiving Chemotherapy  Today you received the following chemotherapy agents Adriamycin and Cytoxan.  To help prevent nausea and vomiting after your treatment, we encourage you to take your nausea medication: Compazine. Take one every 6 hours as needed. For nausea on or after 2/17 you may take Zofran every 8 hours as needed. If you develop nausea and vomiting that is not controlled by your nausea medication, call the clinic.   Your on-body injector is scheduled to to inject at 1PM on 11/07/15.    BELOW ARE SYMPTOMS THAT SHOULD BE REPORTED IMMEDIATELY:  *FEVER GREATER THAN 100.5 F  *CHILLS WITH OR WITHOUT FEVER  NAUSEA AND VOMITING THAT IS NOT CONTROLLED WITH YOUR NAUSEA MEDICATION  *UNUSUAL SHORTNESS OF BREATH  *UNUSUAL BRUISING OR BLEEDING  TENDERNESS IN MOUTH AND THROAT WITH OR WITHOUT PRESENCE OF ULCERS  *URINARY PROBLEMS  *BOWEL PROBLEMS  UNUSUAL RASH Items with * indicate a potential emergency and should be followed up as soon as possible.  Feel free to call the clinic should you have any questions or concerns. The clinic phone number is [phone].  Please show the [facility] at check-in to the Emergency Department and triage nurse.

## 2015-11-09 ENCOUNTER — Other Ambulatory Visit: Payer: Self-pay | Admitting: *Deleted

## 2015-11-09 ENCOUNTER — Telehealth: Payer: Self-pay | Admitting: *Deleted

## 2015-11-09 DIAGNOSIS — C50311 Malignant neoplasm of lower-inner quadrant of right female breast: Secondary | ICD-10-CM

## 2015-11-09 NOTE — Telephone Encounter (Signed)
Called patient and she seems to be doing better this afternoon.  Her sinus problem has improved after taking benadryl and tylenol after she woke up from a nap.

## 2015-11-09 NOTE — Telephone Encounter (Signed)
Pt left message that she has bad sinus problem.  Spoke with pt and was informed that she has pain around eyes and nose.  Denied watery eyes, denied fever, denied swelling of eyes.  Stated no problems with vision, drinking lots of water, eating fine.  Noted pt had chemo on 11/06/15 with onpro Neulasta.  Stated she took Claritin as instructed without relief.  Informed pt that pain could be related to Neulasta.  Instructed pt to take Benadryl 25 mg and Tylenol 650 mg to see if symptoms resolved.  Pt understood to call office back if pain continues in a couple of hours. Pt's  Phone  [phone].

## 2015-11-12 ENCOUNTER — Encounter: Payer: Self-pay | Admitting: Nurse Practitioner

## 2015-11-12 ENCOUNTER — Ambulatory Visit (HOSPITAL_BASED_OUTPATIENT_CLINIC_OR_DEPARTMENT_OTHER): Payer: 59 | Admitting: Nurse Practitioner

## 2015-11-12 ENCOUNTER — Other Ambulatory Visit (HOSPITAL_BASED_OUTPATIENT_CLINIC_OR_DEPARTMENT_OTHER): Payer: 59

## 2015-11-12 VITALS — BP 146/72 | HR 80 | Temp 97.9°F | Resp 18 | Ht 67.0 in | Wt 254.9 lb

## 2015-11-12 DIAGNOSIS — C50311 Malignant neoplasm of lower-inner quadrant of right female breast: Secondary | ICD-10-CM

## 2015-11-12 DIAGNOSIS — Z170 Estrogen receptor positive status [ER+]: Secondary | ICD-10-CM

## 2015-11-12 DIAGNOSIS — R21 Rash and other nonspecific skin eruption: Secondary | ICD-10-CM

## 2015-11-12 DIAGNOSIS — L282 Other prurigo: Secondary | ICD-10-CM

## 2015-11-12 LAB — CBC WITH DIFFERENTIAL/PLATELET
BASO%: 0.6 % (ref 0.0–2.0)
Basophils Absolute: 0 10*3/uL (ref 0.0–0.1)
EOS%: 3.6 % (ref 0.0–7.0)
Eosinophils Absolute: 0.2 10*3/uL (ref 0.0–0.5)
HCT: 36.5 % (ref 34.8–46.6)
HGB: 11.9 g/dL (ref 11.6–15.9)
LYMPH%: 40.9 % (ref 14.0–49.7)
MCH: 28.2 pg (ref 25.1–34.0)
MCHC: 32.6 g/dL (ref 31.5–36.0)
MCV: 86.4 fL (ref 79.5–101.0)
MONO#: 0.3 10*3/uL (ref 0.1–0.9)
MONO%: 4.4 % (ref 0.0–14.0)
NEUT#: 2.9 10*3/uL (ref 1.5–6.5)
NEUT%: 50.5 % (ref 38.4–76.8)
Platelets: 209 10*3/uL (ref 145–400)
RBC: 4.22 10*6/uL (ref 3.70–5.45)
RDW: 15.3 % — ABNORMAL HIGH (ref 11.2–14.5)
WBC: 5.8 10*3/uL (ref 3.9–10.3)
lymph#: 2.4 10*3/uL (ref 0.9–3.3)

## 2015-11-12 LAB — COMPREHENSIVE METABOLIC PANEL
ALT: 10 U/L (ref 0–55)
AST: 13 U/L (ref 5–34)
Albumin: 3.9 g/dL (ref 3.5–5.0)
Alkaline Phosphatase: 99 U/L (ref 40–150)
Anion Gap: 10 mEq/L (ref 3–11)
BUN: 14.6 mg/dL (ref 7.0–26.0)
CO2: 28 mEq/L (ref 22–29)
Calcium: 9.6 mg/dL (ref 8.4–10.4)
Chloride: 105 mEq/L (ref 98–109)
Creatinine: 0.8 mg/dL (ref 0.6–1.1)
EGFR: >90 mL/min/{1.73_m2} (ref >90)
Glucose: 109 mg/dl (ref 70–140)
Potassium: 3.8 mEq/L (ref 3.5–5.1)
Sodium: 142 mEq/L (ref 136–145)
Total Bilirubin: 0.37 mg/dL (ref 0.20–1.20)
Total Protein: 7 g/dL (ref 6.4–8.3)

## 2015-11-12 NOTE — Progress Notes (Signed)
[facility]  [phone] Fax:([phone]) [phone]     ID: [name] DOB: 12-05-1960  MR#: [id number]  SAY#:[id number]  Patient Care Team: Laurey Morale, MD as PCP - Wilson III, MD as Consulting Physician (General Surgery) Chauncey Cruel, MD as Consulting Physician (Oncology) Thea Silversmith, MD as Consulting Physician (Radiation Oncology) Sylvan Cheese, NP as Nurse Practitioner (Hematology and Oncology) PCP: Laurey Morale, MD GYN: OTHER MD: Mardi Mainland MD, Nicholaus Bloom MD  CHIEF COMPLAINT: Multicentric breast cancer  CURRENT TREATMENT: Adjuvant chemotherapy pending   BREAST CANCER HISTORY: From the original intake note:  [name] herself noted a change in her right breast in October and brought it to the attention of her primary care physician. Her last mammogram had been April 2010. Dr. Sarajane Jews set [name] up for bilateral diagnostic mammography with tomosynthesis and [facility] 04/11/2015. This found the breast density to be category B. In the right breast there were 3 microlobulated masses in the lower inner quadrant measuring 1.9, 1.5, and 1.5 cm. These were multicentric. There were also diffuse fine pleomorphic calcifications surrounding these masses, that area spanning 11.6 cm. The masses were palpable by exam at 4:00 8 cm from the nipple and at 5:00 3 cm from the nipple. By ultrasonography there was an irregular hypoechoic mass in the right breast at 4:00 measuring 1.7 cm, 1 medial to this measuring 1.1 cm, and then the third irregular hypoechoic mass at the 5:00 position measuring 1.3 cm. The right axilla showed multiple lymph nodes with thickened cortices. The largest lymph node measured 0.9 cm.  On 08/02/2015, [name] underwent biopsy of all 3 breast masses as well as a right axillary lymph node. 2 of the tumors were similar invasive ductal carcinomas, grade 2, estrogen receptor  95% positive, progesterone receptor 5% positive, with an MIB-1 of 10%, and HER-2 equivocal, with a signals ratio of 1.30 and the number per cell 4.0. The third tumor appeared's grade 1 or 2 was also estrogen receptor positive at 95%, progesterone receptor positive at 5%, with an MIP-1 of 5%, and a similar HER-2 profile the signals ratio being 1.46 but the number per cell being only 3.95. By immunohistochemistry on this tumor HER-2 was negative at 1+. Immunohistochemistry on the equivocal reading is pending.  [name]'s subsequent history is as detailed below  INTERVAL HISTORY: [name] returns today for follow-up of her breast cancer, accompanied by her husband. Today is day 8, cycle 2 cyclophosphamide and doxorubicin, with neulasta onpro for granulocyte support.   REVIEW OF SYSTEMS: [name] had a good 1st week of treatment. She denies fevers or chills. Her nausea was minimal and well managed with the compazine and zofran. She had loose stools that resolved with imodium use. Her appetite is good. She had gained 2lb. She denies mouth sores. She had a pruritic rash to her left upper chest and neck, around the site of her port. This is also along the incision line to her right mastectomy site. She has hot flashes but not night sweats. Her blood sugars are well-controlled. She has rare tingling to her bilateral hands. She has back and joint pain. She uses restoril and elavil nightly for sleep. Aside from these issues a detailed review of systems today was stable   PAST MEDICAL HISTORY: Past Medical History  Diagnosis Date  . Osteoarthritis   . Headache(784.0)     migraine like per patient  . Hypertension   . Low  back pain     dr phillips, pain management  . Degenerative joint disease   . Gallstones   . GERD (gastroesophageal reflux disease)   . DDD (degenerative disc disease), cervical   . Colon polyps   . Diverticulosis   . Diabetes mellitus without complication ([facility])   . Breast cancer of  lower-inner quadrant of right female breast ([facility]) 08/08/2015  . Breast cancer ([facility])     PAST SURGICAL HISTORY: Past Surgical History  Procedure Laterality Date  . Replacement total knee Right     right - dr Novella Olive  . Ganglion cyst excision Right     right wrist  . Lipoma excision Right 02/21/08    right hip, [facility]  . Replacement total knee Left 06/25/09    left - dr Marciano Sequin [facility]  . Tonsillectomy    . Joint replacement      both knees have been done, four times each per patient. Her body rejects the implant, no infection.  . Abdominal hysterectomy  06/22/06    lap, dr Mancel Bale  . Other surgical history      lipoma removed from back 2012   . Cholecystectomy  10/28/2011    Procedure: LAPAROSCOPIC CHOLECYSTECTOMY WITH INTRAOPERATIVE CHOLANGIOGRAM;  Surgeon: Earnstine Regal, MD;  Location: [facility];  Service: General;  Laterality: N/A;  . Mass excision  08/24/2012    Procedure: EXCISION MASS;  Surgeon: Earnstine Regal, MD;  Location: [facility];  Service: General;  Laterality: Left;  excisie soft tissue masses left shoulder(back) & left upper arm  . Colonoscopy  02-03-13    per Dr. Henrene Pastor, diverticuli but no polyps, repeat in 5 yrs   . Mass excision Left 01/24/2014    Procedure: EXCISION SOFT TISSUE MASSES LOWER LEFT BACK;  Surgeon: Earnstine Regal, MD;  Location: [facility];  Service: General;  Laterality: Left;  . Esophagogastroduodenoscopy  02-03-13    per Dr. Henrene Pastor, normal   . Cardiac catheterization      back in 200 by Dr. Stanford Breed  . Tonsillectomy    . Mastectomy w/ sentinel node biopsy Right 10/02/2015  . Mastectomy w/ sentinel node biopsy Right 10/01/2015    Procedure: RIGHT MASTECTOMY WITH SENTINEL LYMPH NODE BIOPSY;  Surgeon: Autumn Messing III, MD;  Location: [facility];  Service: General;  Laterality: Right;  . Portacath placement Left 11/01/2015    Procedure: INSERTION PORT-A-CATH;  Surgeon: Autumn Messing III, MD;  Location: [facility];  Service: General;   Laterality: Left;    FAMILY HISTORY Family History  Problem Relation Age of Onset  . Crohn's disease Other     nephew  . Stomach cancer Mother   . Heart disease Father   . Prostate cancer Father   . Kidney cancer Sister     one out of the four  . Hypertension Sister   . Hypertension Brother   . Colon cancer Maternal Grandmother   The patient's father died at the age of 70 with congestive heart failure. Her mother is still living as of November 2016, age 19. The patient had 6 brothers, 4 sisters. One sister was diagnosed with kidney cancer at age 54. ([place]). She is doing well. The patient's maternal grandmother was diagnosed with colon cancer at the age of 37. The patient's mother was diagnosed with a "rare stomach cancer" 20 years ago. The patient's father was diagnosed with prostate cancer at age 24. There is no history of breast or ovarian cancer in  the family to her knowledge.   GYNECOLOGIC HISTORY:  No LMP recorded. Patient has had a hysterectomy.  menarche age 55, first live birth age 57, the patient is GX P3. She underwent remote hysterectomy with bilateral salpingo-oophorectomy. She did not use hormone replacement. She never used oral contraceptives.   SOCIAL HISTORY:  [name] used to work as a "[employer] but she is now disabled because of her multiple arthritic and degenerative problems. Her husband [name] works as a Freight forwarder. Son [name] works in long care in [place]. Son [name] works for a Arboriculturist in [place]. Daughter  [name] is a Haematologist in [place]. The patient has 5 grandchildren. She attends a Physic[employer], medical church    ADVANCED DIRECTIVES: Not in place   HEALTH MAINTENANCE: Social History  Substance Use Topics  . Smoking status: Current Every Day Smoker -- 0.25 packs/day for 20 years    Types: Cigarettes  . Smokeless tobacco: Former Systems developer    Quit date: 09/30/2015     Comment: quit    . Alcohol Use: No     Colonoscopy:February 2016   HER:DEYCXK post hysterectomy   Bone density:  Lipid panel:  Allergies  Allergen Reactions  . Codeine Itching and Nausea And Vomiting    Itching all over the body  . Latex Hives  . Sulfonamide Derivatives Hives    All over the body    Current Outpatient Prescriptions  Medication Sig Dispense Refill  . amitriptyline (ELAVIL) 25 MG tablet Take 25 mg by mouth at bedtime.     Marland Kitchen amLODipine (NORVASC) 5 MG tablet Take 1 tablet (5 mg total) by mouth 2 (two) times daily. 180 tablet 3  . furosemide (LASIX) 20 MG tablet TAKE 1 TABLET BY MOUTH EVERY DAY 30 tablet 2  . lidocaine-prilocaine (EMLA) cream Apply 1 application topically as needed. 30 g 0  . lisinopril (PRINIVIL,ZESTRIL) 10 MG tablet Take 1 tablet (10 mg total) by mouth daily. 90 tablet 3  . metFORMIN (GLUCOPHAGE) 500 MG tablet Take 1 tablet (500 mg total) by mouth 2 (two) times daily with a meal. 180 tablet 3  . methadone (DOLOPHINE) 10 MG tablet Take 10 mg by mouth every 8 (eight) hours as needed for moderate pain.     . metoprolol (LOPRESSOR) 50 MG tablet Take 100 mg by mouth 2 (two) times daily.    Marland Kitchen omeprazole (PRILOSEC) 40 MG capsule Take 1 capsule (40 mg total) by mouth daily. 90 capsule 3  . ondansetron (ZOFRAN) 8 MG tablet Take 1 tablet (8 mg total) by mouth 2 (two) times daily as needed. Start on the third day after chemotherapy. 30 tablet 1  . potassium chloride (K-DUR) 10 MEQ tablet Take 3 tablets (30 mEq total) by mouth 2 (two) times daily. 180 tablet 11  . prochlorperazine (COMPAZINE) 10 MG tablet Take 1 tablet (10 mg total) by mouth every 6 (six) hours as needed (Nausea or vomiting). 30 tablet 1  . temazepam (RESTORIL) 30 MG capsule TAKE ONE CAPSULE BY MOUTH AT BEDTIME AS NEEDED FOR SLEEP 90 capsule 1   No current facility-administered medications for this visit.    OBJECTIVE: middle-aged African-American woman in no acute distress Filed Vitals:   11/12/15 1454   BP: 146/72  Pulse: 80  Temp: 97.9 F (36.6 C)  Resp: 18     Body mass index is 39.91 kg/(m^2).    ECOG FS:2 - Symptomatic, <50% confined to bed  Moderately sized papules surrounding port  area and along incision line of right mastectomy, no redness, tenderness, or warmth Sclerae unicteric, pupils round and equal Oropharynx clear and moist-- no thrush or other lesions No cervical or supraclavicular adenopathy Lungs no rales or rhonchi Heart regular rate and rhythm Abd soft, nontender, positive bowel sounds MSK no focal spinal tenderness, no upper extremity lymphedema Neuro: nonfocal, well oriented, appropriate affect Breasts: deferred  LAB RESULTS:  CMP     Component Value Date/Time   NA 142 11/12/2015 1424   NA 143 10/30/2015 1535   K 3.8 11/12/2015 1424   K 3.4* 10/30/2015 1535   CL 101 10/30/2015 1535   CO2 28 11/12/2015 1424   CO2 27 10/30/2015 1535   GLUCOSE 109 11/12/2015 1424   GLUCOSE 84 10/30/2015 1535   BUN 14.6 11/12/2015 1424   BUN 8 10/30/2015 1535   CREATININE 0.8 11/12/2015 1424   CREATININE 0.79 10/30/2015 1535   CALCIUM 9.6 11/12/2015 1424   CALCIUM 9.7 10/30/2015 1535   PROT 7.0 11/12/2015 1424   PROT 7.7 06/05/2015 1837   ALBUMIN 3.9 11/12/2015 1424   ALBUMIN 4.8 06/05/2015 1837   AST 13 11/12/2015 1424   AST 24 06/05/2015 1837   ALT 10 11/12/2015 1424   ALT 14 06/05/2015 1837   ALKPHOS 99 11/12/2015 1424   ALKPHOS 69 06/05/2015 1837   BILITOT 0.37 11/12/2015 1424   BILITOT 0.5 06/05/2015 1837   GFRNONAA >60 10/30/2015 1535   GFRAA >60 10/30/2015 1535    INo results found for: SPEP, UPEP  Lab Results  Component Value Date   WBC 5.8 11/12/2015   NEUTROABS 2.9 11/12/2015   HGB 11.9 11/12/2015   HCT 36.5 11/12/2015   MCV 86.4 11/12/2015   PLT 209 11/12/2015      Chemistry      Component Value Date/Time   NA 142 11/12/2015 1424   NA 143 10/30/2015 1535   K 3.8 11/12/2015 1424   K 3.4* 10/30/2015 1535   CL 101 10/30/2015 1535    CO2 28 11/12/2015 1424   CO2 27 10/30/2015 1535   BUN 14.6 11/12/2015 1424   BUN 8 10/30/2015 1535   CREATININE 0.8 11/12/2015 1424   CREATININE 0.79 10/30/2015 1535      Component Value Date/Time   CALCIUM 9.6 11/12/2015 1424   CALCIUM 9.7 10/30/2015 1535   ALKPHOS 99 11/12/2015 1424   ALKPHOS 69 06/05/2015 1837   AST 13 11/12/2015 1424   AST 24 06/05/2015 1837   ALT 10 11/12/2015 1424   ALT 14 06/05/2015 1837   BILITOT 0.37 11/12/2015 1424   BILITOT 0.5 06/05/2015 1837       No results found for: LABCA2  No components found for: LABCA125  No results for input(s): INR in the last 168 hours.  Urinalysis    Component Value Date/Time   COLORURINE YELLOW 06/05/2015 2353   APPEARANCEUR CLOUDY* 06/05/2015 2353   LABSPEC 1.007 06/05/2015 2353   PHURINE 6.5 06/05/2015 2353   GLUCOSEU NEGATIVE 06/05/2015 2353   HGBUR TRACE* 06/05/2015 2353   HGBUR large 07/01/2010 0945   BILIRUBINUR NEGATIVE 06/05/2015 2353   BILIRUBINUR n 02/26/2015 1121   KETONESUR NEGATIVE 06/05/2015 2353   PROTEINUR NEGATIVE 06/05/2015 2353   PROTEINUR n 02/26/2015 1121   UROBILINOGEN 0.2 06/05/2015 2353   UROBILINOGEN 0.2 02/26/2015 1121   NITRITE NEGATIVE 06/05/2015 2353   NITRITE n 02/26/2015 1121   LEUKOCYTESUR NEGATIVE 06/05/2015 2353    STUDIES: Dg Chest Port 1 View  11/01/2015  CLINICAL DATA:  Post Port-A-Cath placement EXAM: PORTABLE CHEST 1 VIEW COMPARISON:  06/05/2015 chest radiograph. FINDINGS: Left subclavian Port-A-Cath terminates in the lower third of the superior vena cava. Right axillary surgical clips are noted Stable cardiomediastinal silhouette with top-normal heart size. No pneumothorax. No pleural effusion. No pulmonary edema. Mild left basilar atelectasis. IMPRESSION: 1. Left subclavian Port-A-Cath terminates in the lower third of the superior vena cava. No pneumothorax. 2. Mild left basilar atelectasis. Electronically Signed   By: Ilona Sorrel M.D.   On: 11/01/2015 11:01   Dg  Fluoro Guide Cv Line-no Report  11/01/2015  CLINICAL DATA:  FLOURO GUIDE CV LINE Fluoroscopy was utilized by the requesting physician.  No radiographic interpretation.    ASSESSMENT: 55 y.o. [place] woman status post right breast biopsy 3 and axillary lymph node biopsy 08/02/2015 for a clinical mpT1c N0, s invasive ductal carcinoma, grade 1 or 2, estrogen receptor 95% positive, progesterone receptor 5% positive, with an MIB-1 between 5 and 10%, and HER-2 equivocal on 1 of the 2 biopsies (signals ratio 1.30, number per cell 4.0).tage IA   (1) status post right modified radical mastectomy 10/01/2015 for an mpT2 pN0, stage IIA invasive ductal carcinoma, grade 2, with a total of 20 benign lymph nodes removed  (2) Oncotype DX score of 32 predicts an outside the breast recurrence risk of 22% within 10 years if the patient's only systemic therapy is tamoxifen for 5 years. It also predicts significant benefit from adjuvant chemotherapy.  (3) patient to receive adjuvant doxorubicin and cyclophosphamide 4, be followed as tolerated by paclitaxel weekly 12  (a) consider PREVENT study will her  (4) postmastectomy radiation to follow as appropriate  (5) anti-estrogens to follow completion of local treatment  (a) consider PALLAS  Study  (b) consider BWEL study  PLAN: Margery tolerated the side effects from her first cycle of treatment remarkably well. I will not be making any changes to her regimen. I believe the rash to her right port is going to be related to contact from the adhesive dressing used to secure the needle. We have put this in as an allergy in her chart, and we will utilize the opsite dressing moving forward. She will take pepcid and benadryl every 6 hours for the pruritus and apply hydrocortisone cream to this area as well.   The labs were reviewed in detail and were stable.   Merari will return in 1 week for cycle 2 of doxorubicin and cyclophosphamide. She understands and agrees with  this plan. She knows the goal of treatment in her case is cure. She has been encouraged to call with any issues that might arise before her next visit here.   Laurie Panda, NP   11/12/2015 3:36 PM

## 2015-11-16 ENCOUNTER — Other Ambulatory Visit: Payer: Self-pay | Admitting: Family Medicine

## 2015-11-16 ENCOUNTER — Other Ambulatory Visit: Payer: Self-pay | Admitting: *Deleted

## 2015-11-16 DIAGNOSIS — C50311 Malignant neoplasm of lower-inner quadrant of right female breast: Secondary | ICD-10-CM

## 2015-11-19 ENCOUNTER — Other Ambulatory Visit (HOSPITAL_BASED_OUTPATIENT_CLINIC_OR_DEPARTMENT_OTHER): Payer: 59

## 2015-11-19 ENCOUNTER — Encounter: Payer: Self-pay | Admitting: Nurse Practitioner

## 2015-11-19 ENCOUNTER — Ambulatory Visit (HOSPITAL_BASED_OUTPATIENT_CLINIC_OR_DEPARTMENT_OTHER): Payer: 59

## 2015-11-19 ENCOUNTER — Ambulatory Visit (HOSPITAL_BASED_OUTPATIENT_CLINIC_OR_DEPARTMENT_OTHER): Payer: 59 | Admitting: Nurse Practitioner

## 2015-11-19 ENCOUNTER — Encounter: Payer: Self-pay | Admitting: *Deleted

## 2015-11-19 VITALS — BP 157/78 | HR 64 | Temp 98.1°F | Resp 22

## 2015-11-19 VITALS — BP 135/83 | HR 67 | Temp 98.2°F | Resp 19 | Ht 67.0 in | Wt 258.9 lb

## 2015-11-19 DIAGNOSIS — Z5111 Encounter for antineoplastic chemotherapy: Secondary | ICD-10-CM

## 2015-11-19 DIAGNOSIS — L259 Unspecified contact dermatitis, unspecified cause: Secondary | ICD-10-CM | POA: Diagnosis not present

## 2015-11-19 DIAGNOSIS — C50311 Malignant neoplasm of lower-inner quadrant of right female breast: Secondary | ICD-10-CM

## 2015-11-19 DIAGNOSIS — Z170 Estrogen receptor positive status [ER+]: Secondary | ICD-10-CM | POA: Diagnosis not present

## 2015-11-19 DIAGNOSIS — Z452 Encounter for adjustment and management of vascular access device: Secondary | ICD-10-CM

## 2015-11-19 LAB — COMPREHENSIVE METABOLIC PANEL
ALT: 12 U/L (ref 0–55)
AST: 14 U/L (ref 5–34)
Albumin: 4 g/dL (ref 3.5–5.0)
Alkaline Phosphatase: 83 U/L (ref 40–150)
Anion Gap: 10 mEq/L (ref 3–11)
BUN: 10.3 mg/dL (ref 7.0–26.0)
CO2: 28 mEq/L (ref 22–29)
Calcium: 9.3 mg/dL (ref 8.4–10.4)
Chloride: 104 mEq/L (ref 98–109)
Creatinine: 0.9 mg/dL (ref 0.6–1.1)
EGFR: 88 mL/min/{1.73_m2} — ABNORMAL LOW (ref >90)
Glucose: 94 mg/dl (ref 70–140)
Potassium: 3.5 mEq/L (ref 3.5–5.1)
Sodium: 141 mEq/L (ref 136–145)
Total Bilirubin: <0.3 mg/dL (ref 0.20–1.20)
Total Protein: 6.9 g/dL (ref 6.4–8.3)

## 2015-11-19 LAB — CBC WITH DIFFERENTIAL/PLATELET
BASO%: 0.6 % (ref 0.0–2.0)
Basophils Absolute: 0 10*3/uL (ref 0.0–0.1)
EOS%: 1.6 % (ref 0.0–7.0)
Eosinophils Absolute: 0.1 10*3/uL (ref 0.0–0.5)
HCT: 36.4 % (ref 34.8–46.6)
HGB: 12 g/dL (ref 11.6–15.9)
LYMPH%: 32.3 % (ref 14.0–49.7)
MCH: 28.3 pg (ref 25.1–34.0)
MCHC: 32.9 g/dL (ref 31.5–36.0)
MCV: 86 fL (ref 79.5–101.0)
MONO#: 0.6 10*3/uL (ref 0.1–0.9)
MONO%: 8.1 % (ref 0.0–14.0)
NEUT#: 4.3 10*3/uL (ref 1.5–6.5)
NEUT%: 57.4 % (ref 38.4–76.8)
Platelets: 308 10*3/uL (ref 145–400)
RBC: 4.23 10*6/uL (ref 3.70–5.45)
RDW: 15 % — ABNORMAL HIGH (ref 11.2–14.5)
WBC: 7.4 10*3/uL (ref 3.9–10.3)
lymph#: 2.4 10*3/uL (ref 0.9–3.3)

## 2015-11-19 MED ORDER — SODIUM CHLORIDE 0.9 % IV SOLN
Freq: Once | INTRAVENOUS | Status: DC
  Filled 2015-11-19: qty 5

## 2015-11-19 MED ORDER — SODIUM CHLORIDE 0.9 % IV SOLN
600.0000 mg/m2 | Freq: Once | INTRAVENOUS | Status: AC
  Administered 2015-11-19: 1400 mg via INTRAVENOUS
  Filled 2015-11-19: qty 70

## 2015-11-19 MED ORDER — PALONOSETRON HCL INJECTION 0.25 MG/5ML
INTRAVENOUS | Status: AC
  Filled 2015-11-19: qty 5

## 2015-11-19 MED ORDER — ALTEPLASE 2 MG IJ SOLR
2.0000 mg | Freq: Once | INTRAMUSCULAR | Status: AC | PRN
  Administered 2015-11-19: 2 mg
  Filled 2015-11-19: qty 2

## 2015-11-19 MED ORDER — SODIUM CHLORIDE 0.9 % IV SOLN
Freq: Once | INTRAVENOUS | Status: AC
  Administered 2015-11-19: 14:00:00 via INTRAVENOUS
  Filled 2015-11-19: qty 5

## 2015-11-19 MED ORDER — PALONOSETRON HCL INJECTION 0.25 MG/5ML
0.2500 mg | Freq: Once | INTRAVENOUS | Status: AC
  Administered 2015-11-19: 0.25 mg via INTRAVENOUS

## 2015-11-19 MED ORDER — SODIUM CHLORIDE 0.9 % IV SOLN
Freq: Once | INTRAVENOUS | Status: AC
  Administered 2015-11-19: 13:00:00 via INTRAVENOUS

## 2015-11-19 MED ORDER — PEGFILGRASTIM 6 MG/0.6ML ~~LOC~~ PSKT
6.0000 mg | PREFILLED_SYRINGE | Freq: Once | SUBCUTANEOUS | Status: AC
  Administered 2015-11-19: 6 mg via SUBCUTANEOUS
  Filled 2015-11-19: qty 0.6

## 2015-11-19 MED ORDER — DOXORUBICIN HCL CHEMO IV INJECTION 2 MG/ML
60.0000 mg/m2 | Freq: Once | INTRAVENOUS | Status: AC
  Administered 2015-11-19: 140 mg via INTRAVENOUS
  Filled 2015-11-19: qty 70

## 2015-11-19 NOTE — Progress Notes (Signed)
[facility]  [phone] [phone]     ID: [name] DOB: 06/28/1961  MR#: [id number]  MHD#:[id number]  Patient Care Team: Laurey Morale, MD as PCP - Cleveland III, MD as Consulting Physician (General Surgery) Chauncey Cruel, MD as Consulting Physician (Oncology) Thea Silversmith, MD as Consulting Physician (Radiation Oncology) Sylvan Cheese, NP as Nurse Practitioner (Hematology and Oncology) PCP: Laurey Morale, MD GYN: OTHER MD: Mardi Mainland MD, Nicholaus Bloom MD  CHIEF COMPLAINT: Multicentric breast cancer  CURRENT TREATMENT: Adjuvant chemotherapy pending   BREAST CANCER HISTORY: From the original intake note:  [name] herself noted a change in her right breast in October and brought it to the attention of her primary care physician. Her last mammogram had been April 2010. Dr. Sarajane Jews set [name] up for bilateral diagnostic mammography with tomosynthesis and [facility] 04/11/2015. This found the breast density to be category B. In the right breast there were 3 microlobulated masses in the lower inner quadrant measuring 1.9, 1.5, and 1.5 cm. These were multicentric. There were also diffuse fine pleomorphic calcifications surrounding these masses, that area spanning 11.6 cm. The masses were palpable by exam at 4:00 8 cm from the nipple and at 5:00 3 cm from the nipple. By ultrasonography there was an irregular hypoechoic mass in the right breast at 4:00 measuring 1.7 cm, 1 medial to this measuring 1.1 cm, and then the third irregular hypoechoic mass at the 5:00 position measuring 1.3 cm. The right axilla showed multiple lymph nodes with thickened cortices. The largest lymph node measured 0.9 cm.  On 08/02/2015, [name] underwent biopsy of all 3 breast masses as well as a right axillary lymph node. 2 of the tumors were similar invasive ductal carcinomas, grade 2, estrogen receptor  95% positive, progesterone receptor 5% positive, with an MIB-1 of 10%, and HER-2 equivocal, with a signals ratio of 1.30 and the number per cell 4.0. The third tumor appeared's grade 1 or 2 was also estrogen receptor positive at 95%, progesterone receptor positive at 5%, with an MIP-1 of 5%, and a similar HER-2 profile the signals ratio being 1.46 but the number per cell being only 3.95. By immunohistochemistry on this tumor HER-2 was negative at 1+. Immunohistochemistry on the equivocal reading is pending.  [name]'s subsequent history is as detailed below  INTERVAL HISTORY: [name] returns today for follow-up of her breast cancer, accompanied by her husband. Today is day 1, cycle 3 cyclophosphamide and doxorubicin, with neulasta onpro for granulocyte support. She has been alternating benadryl and pepcid, but the rash to her left port site and right mastectomy persists. It is less pruritic since applying hydrocortisone cream to these areas BID. The rash it self has not spread, but also not gotten any smaller.   REVIEW OF SYSTEMS: Otherwise, [name] is doing well today. She denies fevers, chills, nausea, or vomiting. She is moving her bowels well. Her appetite is good with no taste changes. She denies mouth sores. Her energy level is good. Her blood sugars are well controlled. She has rare tingling to her bilateral hands. She has back and joint pain. She uses restoril and elavil nightly for sleep, but they are only moderately effective. Aside from these issues a detailed review of systems today was stable  PAST MEDICAL HISTORY: Past Medical History  Diagnosis Date  . Osteoarthritis   . Headache(784.0)     migraine like per patient  . Hypertension   .  Low back pain     dr phillips, pain management  . Degenerative joint disease   . Gallstones   . GERD (gastroesophageal reflux disease)   . DDD (degenerative disc disease), cervical   . Colon polyps   . Diverticulosis   . Diabetes mellitus without  complication ([place])   . Breast cancer of lower-inner quadrant of right female breast ([place]) 08/08/2015  . Breast cancer ([place])     PAST SURGICAL HISTORY: Past Surgical History  Procedure Laterality Date  . Replacement total knee Right     right - dr Novella Olive  . Ganglion cyst excision Right     right wrist  . Lipoma excision Right 02/21/08    right hip, [facility]  . Replacement total knee Left 06/25/09    left - dr Marciano Sequin [facility]  . Tonsillectomy    . Joint replacement      both knees have been done, four times each per patient. Her body rejects the implant, no infection.  . Abdominal hysterectomy  06/22/06    lap, dr Mancel Bale  . Other surgical history      lipoma removed from back 2012   . Cholecystectomy  10/28/2011    Procedure: LAPAROSCOPIC CHOLECYSTECTOMY WITH INTRAOPERATIVE CHOLANGIOGRAM;  Surgeon: Earnstine Regal, MD;  Location: [facility];  Service: General;  Laterality: N/A;  . Mass excision  08/24/2012    Procedure: EXCISION MASS;  Surgeon: Earnstine Regal, MD;  Location: [facility];  Service: General;  Laterality: Left;  excisie soft tissue masses left shoulder(back) & left upper arm  . Colonoscopy  02-03-13    per Dr. Henrene Pastor, diverticuli but no polyps, repeat in 5 yrs   . Mass excision Left 01/24/2014    Procedure: EXCISION SOFT TISSUE MASSES LOWER LEFT BACK;  Surgeon: Earnstine Regal, MD;  Location: [facility];  Service: General;  Laterality: Left;  . Esophagogastroduodenoscopy  02-03-13    per Dr. Henrene Pastor, normal   . Cardiac catheterization      back in 200 by Dr. Stanford Breed  . Tonsillectomy    . Mastectomy w/ sentinel node biopsy Right 10/02/2015  . Mastectomy w/ sentinel node biopsy Right 10/01/2015    Procedure: RIGHT MASTECTOMY WITH SENTINEL LYMPH NODE BIOPSY;  Surgeon: Autumn Messing III, MD;  Location: [facility];  Service: General;  Laterality: Right;  . Portacath placement Left 11/01/2015    Procedure: INSERTION PORT-A-CATH;  Surgeon: Autumn Messing III, MD;  Location: [facility];  Service: General;  Laterality: Left;    FAMILY HISTORY Family History  Problem Relation Age of Onset  . Crohn's disease Other     nephew  . Stomach cancer Mother   . Heart disease Father   . Prostate cancer Father   . Kidney cancer Sister     one out of the four  . Hypertension Sister   . Hypertension Brother   . Colon cancer Maternal Grandmother   The patient's father died at the age of 47 with congestive heart failure. Her mother is still living as of November 2016, age 77. The patient had 6 brothers, 4 sisters. One sister was diagnosed with kidney cancer at age 46. ([place]). She is doing well. The patient's maternal grandmother was diagnosed with colon cancer at the age of 61. The patient's mother was diagnosed with a "rare stomach cancer" 20 years ago. The patient's father was diagnosed with prostate cancer at age 31. There is no history of breast or ovarian cancer  in the family to her knowledge.   GYNECOLOGIC HISTORY:  No LMP recorded. Patient has had a hysterectomy.  menarche age 54, first live birth age 64, the patient is GX P3. She underwent remote hysterectomy with bilateral salpingo-oophorectomy. She did not use hormone replacement. She never used oral contraceptives.   SOCIAL HISTORY:  [name] used to work as a "[employer] but she is now disabled because of her multiple arthritic and degenerative problems. Her husband [name] works as a Freight forwarder. Son [name] works in long care in [place]. Son [name] works for a Arboriculturist in [place]. Daughter  [name] is a Haematologist in [place]. The patient has 5 grandchildren. She attends a Physicist, medical church    ADVANCED DIRECTIVES: Not in place   HEALTH MAINTENANCE: Social History  Substance Use Topics  . Smoking status: Current Every Day Smoker -- 0.25 packs/day for 20 years    Types: Cigarettes  . Smokeless tobacco: Former Systems developer     Quit date: 09/30/2015     Comment: quit  . Alcohol Use: No     Colonoscopy:February 2016   PPI:RJJOAC post hysterectomy   Bone density:  Lipid panel:  Allergies  Allergen Reactions  . Codeine Itching and Nausea And Vomiting    Itching all over the body  . Latex Hives  . Sulfonamide Derivatives Hives    All over the body    Current Outpatient Prescriptions  Medication Sig Dispense Refill  . amitriptyline (ELAVIL) 25 MG tablet Take 25 mg by mouth at bedtime.     Marland Kitchen amLODipine (NORVASC) 5 MG tablet Take 1 tablet (5 mg total) by mouth 2 (two) times daily. 180 tablet 3  . furosemide (LASIX) 20 MG tablet TAKE 1 TABLET BY MOUTH EVERY DAY 30 tablet 6  . lidocaine-prilocaine (EMLA) cream Apply 1 application topically as needed. 30 g 0  . lisinopril (PRINIVIL,ZESTRIL) 10 MG tablet Take 1 tablet (10 mg total) by mouth daily. 90 tablet 3  . metFORMIN (GLUCOPHAGE) 500 MG tablet Take 1 tablet (500 mg total) by mouth 2 (two) times daily with a meal. 180 tablet 3  . methadone (DOLOPHINE) 10 MG tablet Take 10 mg by mouth every 8 (eight) hours as needed for moderate pain.     . metoprolol (LOPRESSOR) 50 MG tablet Take 100 mg by mouth 2 (two) times daily.    Marland Kitchen omeprazole (PRILOSEC) 40 MG capsule Take 1 capsule (40 mg total) by mouth daily. 90 capsule 3  . ondansetron (ZOFRAN) 8 MG tablet Take 1 tablet (8 mg total) by mouth 2 (two) times daily as needed. Start on the third day after chemotherapy. 30 tablet 1  . potassium chloride (K-DUR) 10 MEQ tablet Take 3 tablets (30 mEq total) by mouth 2 (two) times daily. 180 tablet 11  . prochlorperazine (COMPAZINE) 10 MG tablet Take 1 tablet (10 mg total) by mouth every 6 (six) hours as needed (Nausea or vomiting). 30 tablet 1  . temazepam (RESTORIL) 30 MG capsule TAKE ONE CAPSULE BY MOUTH AT BEDTIME AS NEEDED FOR SLEEP 90 capsule 1   No current facility-administered medications for this visit.    OBJECTIVE: middle-aged African-American woman in no acute  distress Filed Vitals:   11/19/15 1024  BP: 135/83  Pulse: 67  Temp: 98.2 F (36.8 C)  Resp: 19     Body mass index is 40.54 kg/(m^2).    ECOG FS:2 - Symptomatic, <50% confined to bed  Skin: warm, dry, flesh toned  papules surrounding port are and along incision line of right mastectomy, no redness, tenderness, or warmth  HEENT: sclerae anicteric, conjunctivae pink, oropharynx clear. No thrush or mucositis.  Lymph Nodes: No cervical or supraclavicular lymphadenopathy  Lungs: clear to auscultation bilaterally, no rales, wheezes, or rhonci  Heart: regular rate and rhythm  Abdomen: round, soft, non tender, positive bowel sounds  Musculoskeletal: No focal spinal tenderness, no peripheral edema  Neuro: non focal, well oriented, positive affect  Breasts: deferred  LAB RESULTS:  CMP     Component Value Date/Time   NA 141 11/19/2015 1012   NA 143 10/30/2015 1535   K 3.5 11/19/2015 1012   K 3.4* 10/30/2015 1535   CL 101 10/30/2015 1535   CO2 28 11/19/2015 1012   CO2 27 10/30/2015 1535   GLUCOSE 94 11/19/2015 1012   GLUCOSE 84 10/30/2015 1535   BUN 10.3 11/19/2015 1012   BUN 8 10/30/2015 1535   CREATININE 0.9 11/19/2015 1012   CREATININE 0.79 10/30/2015 1535   CALCIUM 9.3 11/19/2015 1012   CALCIUM 9.7 10/30/2015 1535   PROT 6.9 11/19/2015 1012   PROT 7.7 06/05/2015 1837   ALBUMIN 4.0 11/19/2015 1012   ALBUMIN 4.8 06/05/2015 1837   AST 14 11/19/2015 1012   AST 24 06/05/2015 1837   ALT 12 11/19/2015 1012   ALT 14 06/05/2015 1837   ALKPHOS 83 11/19/2015 1012   ALKPHOS 69 06/05/2015 1837   BILITOT <0.30 11/19/2015 1012   BILITOT 0.5 06/05/2015 1837   GFRNONAA >60 10/30/2015 1535   GFRAA >60 10/30/2015 1535    INo results found for: SPEP, UPEP  Lab Results  Component Value Date   WBC 7.4 11/19/2015   NEUTROABS 4.3 11/19/2015   HGB 12.0 11/19/2015   HCT 36.4 11/19/2015   MCV 86.0 11/19/2015   PLT 308 11/19/2015      Chemistry      Component Value Date/Time   NA  141 11/19/2015 1012   NA 143 10/30/2015 1535   K 3.5 11/19/2015 1012   K 3.4* 10/30/2015 1535   CL 101 10/30/2015 1535   CO2 28 11/19/2015 1012   CO2 27 10/30/2015 1535   BUN 10.3 11/19/2015 1012   BUN 8 10/30/2015 1535   CREATININE 0.9 11/19/2015 1012   CREATININE 0.79 10/30/2015 1535      Component Value Date/Time   CALCIUM 9.3 11/19/2015 1012   CALCIUM 9.7 10/30/2015 1535   ALKPHOS 83 11/19/2015 1012   ALKPHOS 69 06/05/2015 1837   AST 14 11/19/2015 1012   AST 24 06/05/2015 1837   ALT 12 11/19/2015 1012   ALT 14 06/05/2015 1837   BILITOT <0.30 11/19/2015 1012   BILITOT 0.5 06/05/2015 1837       No results found for: LABCA2  No components found for: LABCA125  No results for input(s): INR in the last 168 hours.  Urinalysis    Component Value Date/Time   COLORURINE YELLOW 06/05/2015 2353   APPEARANCEUR CLOUDY* 06/05/2015 2353   LABSPEC 1.007 06/05/2015 2353   PHURINE 6.5 06/05/2015 2353   GLUCOSEU NEGATIVE 06/05/2015 2353   HGBUR TRACE* 06/05/2015 2353   HGBUR large 07/01/2010 0945   BILIRUBINUR NEGATIVE 06/05/2015 2353   BILIRUBINUR n 02/26/2015 1121   KETONESUR NEGATIVE 06/05/2015 2353   PROTEINUR NEGATIVE 06/05/2015 2353   PROTEINUR n 02/26/2015 1121   UROBILINOGEN 0.2 06/05/2015 2353   UROBILINOGEN 0.2 02/26/2015 1121   NITRITE NEGATIVE 06/05/2015 2353   NITRITE n 02/26/2015 1121   LEUKOCYTESUR NEGATIVE 06/05/2015  2353    STUDIES: Dg Chest Port 1 View  11/01/2015  CLINICAL DATA:  Post Port-A-Cath placement EXAM: PORTABLE CHEST 1 VIEW COMPARISON:  06/05/2015 chest radiograph. FINDINGS: Left subclavian Port-A-Cath terminates in the lower third of the superior vena cava. Right axillary surgical clips are noted Stable cardiomediastinal silhouette with top-normal heart size. No pneumothorax. No pleural effusion. No pulmonary edema. Mild left basilar atelectasis. IMPRESSION: 1. Left subclavian Port-A-Cath terminates in the lower third of the superior vena cava. No  pneumothorax. 2. Mild left basilar atelectasis. Electronically Signed   By: Ilona Sorrel M.D.   On: 11/01/2015 11:01   Dg Fluoro Guide Cv Line-no Report  11/01/2015  CLINICAL DATA:  FLOURO GUIDE CV LINE Fluoroscopy was utilized by the requesting physician.  No radiographic interpretation.    ASSESSMENT: 55 y.o. [place] woman status post right breast biopsy 3 and axillary lymph node biopsy 08/02/2015 for a clinical mpT1c N0, s invasive ductal carcinoma, grade 1 or 2, estrogen receptor 95% positive, progesterone receptor 5% positive, with an MIB-1 between 5 and 10%, and HER-2 equivocal on 1 of the 2 biopsies (signals ratio 1.30, number per cell 4.0).tage IA   (1) status post right modified radical mastectomy 10/01/2015 for an mpT2 pN0, stage IIA invasive ductal carcinoma, grade 2, with a total of 20 benign lymph nodes removed  (2) Oncotype DX score of 32 predicts an outside the breast recurrence risk of 22% within 10 years if the patient's only systemic therapy is tamoxifen for 5 years. It also predicts significant benefit from adjuvant chemotherapy.  (3) patient to receive adjuvant doxorubicin and cyclophosphamide 4, be followed as tolerated by paclitaxel weekly 12  (a) consider PREVENT study will her  (4) postmastectomy radiation to follow as appropriate  (5) anti-estrogens to follow completion of local treatment  (a) consider PALLAS  Study  (b) consider BWEL study  PLAN: Lavender is doing well today, but her rash has barely improved. It is obviously a contact dermatitis, given the localized nature to her incision lines at the port area and the right mastectomy site, possibly relate to surgical glue. The area at her neck, where the is no longer glue, has resolved. She will continue to apply hydrocortisone cream to these areas.   The labs were reviewed in detail and were entirely stable. She will proceed with cycle 2 of doxorubicin and cyclophosphamide as planned.   Abelina will return in  1 week for labs and a nadir visit. She understands and agrees with this plan. She knows the goal of treatment in her case is cure. She has been encouraged to call with any issues that might arise before her next visit here.   Laurie Panda, NP   11/19/2015 11:00 AM

## 2015-11-19 NOTE — Patient Instructions (Signed)
[facility] Discharge Instructions for Patients Receiving Chemotherapy  Today you received the following chemotherapy agents Adriamycin and Cytoxan.  To help prevent nausea and vomiting after your treatment, we encourage you to take your nausea medication: Compazine. Take one every 6 hours as needed. For nausea on or after 2/17 you may take Zofran every 8 hours as needed. If you develop nausea and vomiting that is not controlled by your nausea medication, call the clinic.   Your on-body injector is scheduled to to inject at 1PM on 11/07/15.    BELOW ARE SYMPTOMS THAT SHOULD BE REPORTED IMMEDIATELY:  *FEVER GREATER THAN 100.5 F  *CHILLS WITH OR WITHOUT FEVER  NAUSEA AND VOMITING THAT IS NOT CONTROLLED WITH YOUR NAUSEA MEDICATION  *UNUSUAL SHORTNESS OF BREATH  *UNUSUAL BRUISING OR BLEEDING  TENDERNESS IN MOUTH AND THROAT WITH OR WITHOUT PRESENCE OF ULCERS  *URINARY PROBLEMS  *BOWEL PROBLEMS  UNUSUAL RASH Items with * indicate a potential emergency and should be followed up as soon as possible.  Feel free to call the clinic should you have any questions or concerns. The clinic phone number is [phone].  Please show the [facility] at check-in to the Emergency Department and triage nurse.

## 2015-11-20 ENCOUNTER — Other Ambulatory Visit: Payer: Medicare Other

## 2015-11-20 ENCOUNTER — Ambulatory Visit: Payer: Medicare Other

## 2015-11-20 NOTE — Addendum Note (Signed)
Addended by: Marcelino Duster on: 11/20/2015 09:06 AM   Modules accepted: Orders

## 2015-11-26 ENCOUNTER — Other Ambulatory Visit (HOSPITAL_BASED_OUTPATIENT_CLINIC_OR_DEPARTMENT_OTHER): Payer: 59

## 2015-11-26 ENCOUNTER — Ambulatory Visit (HOSPITAL_BASED_OUTPATIENT_CLINIC_OR_DEPARTMENT_OTHER): Payer: 59 | Admitting: Nurse Practitioner

## 2015-11-26 ENCOUNTER — Encounter: Payer: Self-pay | Admitting: Nurse Practitioner

## 2015-11-26 ENCOUNTER — Telehealth: Payer: Self-pay | Admitting: Nurse Practitioner

## 2015-11-26 VITALS — BP 143/89 | HR 72 | Temp 98.4°F | Resp 18 | Wt 251.6 lb

## 2015-11-26 DIAGNOSIS — C50311 Malignant neoplasm of lower-inner quadrant of right female breast: Secondary | ICD-10-CM

## 2015-11-26 DIAGNOSIS — J069 Acute upper respiratory infection, unspecified: Secondary | ICD-10-CM | POA: Diagnosis not present

## 2015-11-26 DIAGNOSIS — Z170 Estrogen receptor positive status [ER+]: Secondary | ICD-10-CM

## 2015-11-26 DIAGNOSIS — E876 Hypokalemia: Secondary | ICD-10-CM

## 2015-11-26 LAB — COMPREHENSIVE METABOLIC PANEL
ALT: 10 U/L (ref 0–55)
AST: 16 U/L (ref 5–34)
Albumin: 4.1 g/dL (ref 3.5–5.0)
Alkaline Phosphatase: 122 U/L (ref 40–150)
Anion Gap: 10 mEq/L (ref 3–11)
BUN: 10.8 mg/dL (ref 7.0–26.0)
CO2: 26 mEq/L (ref 22–29)
Calcium: 9.3 mg/dL (ref 8.4–10.4)
Chloride: 106 mEq/L (ref 98–109)
Creatinine: 0.8 mg/dL (ref 0.6–1.1)
EGFR: >90 mL/min/{1.73_m2} (ref >90)
Glucose: 92 mg/dl (ref 70–140)
Potassium: 3.3 mEq/L — ABNORMAL LOW (ref 3.5–5.1)
Sodium: 142 mEq/L (ref 136–145)
Total Bilirubin: 0.41 mg/dL (ref 0.20–1.20)
Total Protein: 7.2 g/dL (ref 6.4–8.3)

## 2015-11-26 LAB — CBC WITH DIFFERENTIAL/PLATELET
BASO%: 0.5 % (ref 0.0–2.0)
Basophils Absolute: 0 10*3/uL (ref 0.0–0.1)
EOS%: 0.5 % (ref 0.0–7.0)
Eosinophils Absolute: 0 10*3/uL (ref 0.0–0.5)
HCT: 35.1 % (ref 34.8–46.6)
HGB: 11.4 g/dL — ABNORMAL LOW (ref 11.6–15.9)
LYMPH%: 25.1 % (ref 14.0–49.7)
MCH: 28.2 pg (ref 25.1–34.0)
MCHC: 32.5 g/dL (ref 31.5–36.0)
MCV: 86.9 fL (ref 79.5–101.0)
MONO#: 0.6 10*3/uL (ref 0.1–0.9)
MONO%: 8.4 % (ref 0.0–14.0)
NEUT#: 4.8 10*3/uL (ref 1.5–6.5)
NEUT%: 65.5 % (ref 38.4–76.8)
Platelets: 238 10*3/uL (ref 145–400)
RBC: 4.04 10*6/uL (ref 3.70–5.45)
RDW: 15.2 % — ABNORMAL HIGH (ref 11.2–14.5)
WBC: 7.3 10*3/uL (ref 3.9–10.3)
lymph#: 1.8 10*3/uL (ref 0.9–3.3)

## 2015-11-26 NOTE — Telephone Encounter (Signed)
appt made and avs printed °

## 2015-11-26 NOTE — Progress Notes (Signed)
[place]  [phone] [phone]     ID: PHOENICIA PIRIE DOB: [birth year]  MR#: [id number]  HWE#:[id number]  Patient Care Team: Laurey Morale, MD as PCP - Galva III, MD as Consulting Physician (General Surgery) Chauncey Cruel, MD as Consulting Physician (Oncology) Thea Silversmith, MD as Consulting Physician (Radiation Oncology) Sylvan Cheese, NP as Nurse Practitioner (Hematology and Oncology) PCP: Laurey Morale, MD GYN: OTHER MD: Mardi Mainland MD, Nicholaus Bloom MD  CHIEF COMPLAINT: Multicentric breast cancer  CURRENT TREATMENT: Adjuvant chemotherapy pending   BREAST CANCER HISTORY: From the original intake note:  Keyleigh herself noted a change in her right breast in October and brought it to the attention of her primary care physician. Her last mammogram had been April 2010. Dr. Sarajane Jews set [name] up for bilateral diagnostic mammography with tomosynthesis and [facility] 04/11/2015. This found the breast density to be category B. In the right breast there were 3 microlobulated masses in the lower inner quadrant measuring 1.9, 1.5, and 1.5 cm. These were multicentric. There were also diffuse fine pleomorphic calcifications surrounding these masses, that area spanning 11.6 cm. The masses were palpable by exam at 4:00 8 cm from the nipple and at 5:00 3 cm from the nipple. By ultrasonography there was an irregular hypoechoic mass in the right breast at 4:00 measuring 1.7 cm, 1 medial to this measuring 1.1 cm, and then the third irregular hypoechoic mass at the 5:00 position measuring 1.3 cm. The right axilla showed multiple lymph nodes with thickened cortices. The largest lymph node measured 0.9 cm.  On 08/02/2015, [name] underwent biopsy of all 3 breast masses as well as a right axillary lymph node. 2 of the tumors were similar invasive ductal carcinomas, grade 2, estrogen receptor  95% positive, progesterone receptor 5% positive, with an MIB-1 of 10%, and HER-2 equivocal, with a signals ratio of 1.30 and the number per cell 4.0. The third tumor appeared's grade 1 or 2 was also estrogen receptor positive at 95%, progesterone receptor positive at 5%, with an MIP-1 of 5%, and a similar HER-2 profile the signals ratio being 1.46 but the number per cell being only 3.95. By immunohistochemistry on this tumor HER-2 was negative at 1+. Immunohistochemistry on the equivocal reading is pending.  Marybel's subsequent history is as detailed below  INTERVAL HISTORY: Bao returns today for follow-up of her breast cancer, accompanied by her husband. Today is day 8, cycle 3 cyclophosphamide and doxorubicin, with neulasta onpro for granulocyte support.  REVIEW OF SYSTEMS: Jacquelyne had a slightly different experience with this cycle of treatment. She ended up staying with her mother for a few days because the bone pain from neulasta aggravated her arthritis, despite claritin use. She has methadone to use, prescribed by a different office. She denies fevers, chills, nausea, or vomiting. She moved her bowels well. Her appetite is good, but she has lost 5lbs since last week. She denies mouth sores. The rash to her left port and right mastectomy site is clearing. She is batting a sinus cold with congestion and cough productive of yellow to clear mucus. A detailed review of systems is otherwise stable.  PAST MEDICAL HISTORY: Past Medical History  Diagnosis Date  . Osteoarthritis   . Headache(784.0)     migraine like per patient  . Hypertension   . Low back pain     dr phillips, pain management  . Degenerative joint disease   .  Gallstones   . GERD (gastroesophageal reflux disease)   . DDD (degenerative disc disease), cervical   . Colon polyps   . Diverticulosis   . Diabetes mellitus without complication ([place])   . Breast cancer of lower-inner quadrant of right female breast ([place]) 08/08/2015    . Breast cancer ([place])     PAST SURGICAL HISTORY: Past Surgical History  Procedure Laterality Date  . Replacement total knee Right     right - dr Novella Olive  . Ganglion cyst excision Right     right wrist  . Lipoma excision Right 02/21/08    right hip, [facility]  . Replacement total knee Left 06/25/09    left - dr Marciano Sequin [facility]  . Tonsillectomy    . Joint replacement      both knees have been done, four times each per patient. Her body rejects the implant, no infection.  . Abdominal hysterectomy  06/22/06    lap, dr Mancel Bale  . Other surgical history      lipoma removed from back 2012   . Cholecystectomy  10/28/2011    Procedure: LAPAROSCOPIC CHOLECYSTECTOMY WITH INTRAOPERATIVE CHOLANGIOGRAM;  Surgeon: Earnstine Regal, MD;  Location: [facility];  Service: General;  Laterality: N/A;  . Mass excision  08/24/2012    Procedure: EXCISION MASS;  Surgeon: Earnstine Regal, MD;  Location: [facility];  Service: General;  Laterality: Left;  excisie soft tissue masses left shoulder(back) & left upper arm  . Colonoscopy  02-03-13    per Dr. Henrene Pastor, diverticuli but no polyps, repeat in 5 yrs   . Mass excision Left 01/24/2014    Procedure: EXCISION SOFT TISSUE MASSES LOWER LEFT BACK;  Surgeon: Earnstine Regal, MD;  Location: [facility];  Service: General;  Laterality: Left;  . Esophagogastroduodenoscopy  02-03-13    per Dr. Henrene Pastor, normal   . Cardiac catheterization      back in 200 by Dr. Stanford Breed  . Tonsillectomy    . Mastectomy w/ sentinel node biopsy Right 10/02/2015  . Mastectomy w/ sentinel node biopsy Right 10/01/2015    Procedure: RIGHT MASTECTOMY WITH SENTINEL LYMPH NODE BIOPSY;  Surgeon: Autumn Messing III, MD;  Location: [facility];  Service: General;  Laterality: Right;  . Portacath placement Left 11/01/2015    Procedure: INSERTION PORT-A-CATH;  Surgeon: Autumn Messing III, MD;  Location: [facility];  Service: General;  Laterality: Left;    FAMILY HISTORY Family History  Problem  Relation Age of Onset  . Crohn's disease Other     nephew  . Stomach cancer Mother   . Heart disease Father   . Prostate cancer Father   . Kidney cancer Sister     one out of the four  . Hypertension Sister   . Hypertension Brother   . Colon cancer Maternal Grandmother   The patient's father died at the age of 2 with congestive heart failure. Her mother is still living as of November 2016, age 25. The patient had 6 brothers, 4 sisters. One sister was diagnosed with kidney cancer at age 81. ([place]). She is doing well. The patient's maternal grandmother was diagnosed with colon cancer at the age of 36. The patient's mother was diagnosed with a "rare stomach cancer" 20 years ago. The patient's father was diagnosed with prostate cancer at age 37. There is no history of breast or ovarian cancer in the family to her knowledge.   GYNECOLOGIC HISTORY:  No LMP recorded. Patient has had a  hysterectomy.  menarche age 20, first live birth age 89, the patient is GX P3. She underwent remote hysterectomy with bilateral salpingo-oophorectomy. She did not use hormone replacement. She never used oral contraceptives.   SOCIAL HISTORY:  Arlene used to work as a "[employer] but she is now disabled because of her multiple arthritic and degenerative problems. Her husband Elberta Fortis A. Kafer works as a Freight forwarder. Son Dalphine Handing works in long care in [place]. Son Evette Doffing works for a Arboriculturist in [place]. Daughter  Silva Bandy is a Haematologist in [place]. The patient has 5 grandchildren. She attends a Physicist, medical church    ADVANCED DIRECTIVES: Not in place   HEALTH MAINTENANCE: Social History  Substance Use Topics  . Smoking status: Current Every Day Smoker -- 0.25 packs/day for 20 years    Types: Cigarettes  . Smokeless tobacco: Former Systems developer    Quit date: 09/30/2015     Comment: quit  . Alcohol Use: No     Colonoscopy:February 2016   VQM:GQQPYP  post hysterectomy   Bone density:  Lipid panel:  Allergies  Allergen Reactions  . Codeine Itching and Nausea And Vomiting    Itching all over the body  . Latex Hives  . Sulfonamide Derivatives Hives    All over the body    Current Outpatient Prescriptions  Medication Sig Dispense Refill  . amitriptyline (ELAVIL) 25 MG tablet Take 25 mg by mouth at bedtime.     Marland Kitchen amLODipine (NORVASC) 5 MG tablet Take 1 tablet (5 mg total) by mouth 2 (two) times daily. 180 tablet 3  . furosemide (LASIX) 20 MG tablet TAKE 1 TABLET BY MOUTH EVERY DAY 30 tablet 6  . lidocaine-prilocaine (EMLA) cream Apply 1 application topically as needed. 30 g 0  . lisinopril (PRINIVIL,ZESTRIL) 10 MG tablet Take 1 tablet (10 mg total) by mouth daily. 90 tablet 3  . metFORMIN (GLUCOPHAGE) 500 MG tablet Take 1 tablet (500 mg total) by mouth 2 (two) times daily with a meal. 180 tablet 3  . methadone (DOLOPHINE) 10 MG tablet Take 10 mg by mouth every 8 (eight) hours as needed for moderate pain.     . metoprolol (LOPRESSOR) 50 MG tablet Take 100 mg by mouth 2 (two) times daily.    Marland Kitchen omeprazole (PRILOSEC) 40 MG capsule Take 1 capsule (40 mg total) by mouth daily. 90 capsule 3  . ondansetron (ZOFRAN) 8 MG tablet Take 1 tablet (8 mg total) by mouth 2 (two) times daily as needed. Start on the third day after chemotherapy. 30 tablet 1  . potassium chloride (K-DUR) 10 MEQ tablet Take 3 tablets (30 mEq total) by mouth 2 (two) times daily. 180 tablet 11  . temazepam (RESTORIL) 30 MG capsule TAKE ONE CAPSULE BY MOUTH AT BEDTIME AS NEEDED FOR SLEEP 90 capsule 1  . prochlorperazine (COMPAZINE) 10 MG tablet Take 1 tablet (10 mg total) by mouth every 6 (six) hours as needed (Nausea or vomiting). (Patient not taking: Reported on 11/26/2015) 30 tablet 1   No current facility-administered medications for this visit.    OBJECTIVE: middle-aged African-American woman in no acute distress Filed Vitals:   11/26/15 1321  BP: 143/89  Pulse: 72    Temp: 98.4 F (36.9 C)  Resp: 18     Body mass index is 39.4 kg/(m^2).    ECOG FS:2 - Symptomatic, <50% confined to bed  Sclerae unicteric, pupils round and equal Oropharynx clear and moist-- no thrush or other lesions No  cervical or supraclavicular adenopathy Lungs no rales or rhonchi, cough productive of clear phlegm.  Heart regular rate and rhythm Abd soft, nontender, positive bowel sounds MSK no focal spinal tenderness, no upper extremity lymphedema Neuro: nonfocal, well oriented, appropriate affect Breasts: deferred  LAB RESULTS:  CMP     Component Value Date/Time   NA 142 11/26/2015 1249   NA 143 10/30/2015 1535   K 3.3* 11/26/2015 1249   K 3.4* 10/30/2015 1535   CL 101 10/30/2015 1535   CO2 26 11/26/2015 1249   CO2 27 10/30/2015 1535   GLUCOSE 92 11/26/2015 1249   GLUCOSE 84 10/30/2015 1535   BUN 10.8 11/26/2015 1249   BUN 8 10/30/2015 1535   CREATININE 0.8 11/26/2015 1249   CREATININE 0.79 10/30/2015 1535   CALCIUM 9.3 11/26/2015 1249   CALCIUM 9.7 10/30/2015 1535   PROT 7.2 11/26/2015 1249   PROT 7.7 06/05/2015 1837   ALBUMIN 4.1 11/26/2015 1249   ALBUMIN 4.8 06/05/2015 1837   AST 16 11/26/2015 1249   AST 24 06/05/2015 1837   ALT 10 11/26/2015 1249   ALT 14 06/05/2015 1837   ALKPHOS 122 11/26/2015 1249   ALKPHOS 69 06/05/2015 1837   BILITOT 0.41 11/26/2015 1249   BILITOT 0.5 06/05/2015 1837   GFRNONAA >60 10/30/2015 1535   GFRAA >60 10/30/2015 1535    INo results found for: SPEP, UPEP  Lab Results  Component Value Date   WBC 7.3 11/26/2015   NEUTROABS 4.8 11/26/2015   HGB 11.4* 11/26/2015   HCT 35.1 11/26/2015   MCV 86.9 11/26/2015   PLT 238 11/26/2015      Chemistry      Component Value Date/Time   NA 142 11/26/2015 1249   NA 143 10/30/2015 1535   K 3.3* 11/26/2015 1249   K 3.4* 10/30/2015 1535   CL 101 10/30/2015 1535   CO2 26 11/26/2015 1249   CO2 27 10/30/2015 1535   BUN 10.8 11/26/2015 1249   BUN 8 10/30/2015 1535   CREATININE  0.8 11/26/2015 1249   CREATININE 0.79 10/30/2015 1535      Component Value Date/Time   CALCIUM 9.3 11/26/2015 1249   CALCIUM 9.7 10/30/2015 1535   ALKPHOS 122 11/26/2015 1249   ALKPHOS 69 06/05/2015 1837   AST 16 11/26/2015 1249   AST 24 06/05/2015 1837   ALT 10 11/26/2015 1249   ALT 14 06/05/2015 1837   BILITOT 0.41 11/26/2015 1249   BILITOT 0.5 06/05/2015 1837       No results found for: LABCA2  No components found for: LABCA125  No results for input(s): INR in the last 168 hours.  Urinalysis    Component Value Date/Time   COLORURINE YELLOW 06/05/2015 2353   APPEARANCEUR CLOUDY* 06/05/2015 2353   LABSPEC 1.007 06/05/2015 2353   PHURINE 6.5 06/05/2015 2353   GLUCOSEU NEGATIVE 06/05/2015 2353   HGBUR TRACE* 06/05/2015 2353   HGBUR large 07/01/2010 0945   BILIRUBINUR NEGATIVE 06/05/2015 2353   BILIRUBINUR n 02/26/2015 1121   KETONESUR NEGATIVE 06/05/2015 2353   PROTEINUR NEGATIVE 06/05/2015 2353   PROTEINUR n 02/26/2015 1121   UROBILINOGEN 0.2 06/05/2015 2353   UROBILINOGEN 0.2 02/26/2015 1121   NITRITE NEGATIVE 06/05/2015 2353   NITRITE n 02/26/2015 1121   LEUKOCYTESUR NEGATIVE 06/05/2015 2353   STUDIES: Dg Chest Port 1 View  11/01/2015  CLINICAL DATA:  Post Port-A-Cath placement EXAM: PORTABLE CHEST 1 VIEW COMPARISON:  06/05/2015 chest radiograph. FINDINGS: Left subclavian Port-A-Cath terminates in the lower third of the  superior vena cava. Right axillary surgical clips are noted Stable cardiomediastinal silhouette with top-normal heart size. No pneumothorax. No pleural effusion. No pulmonary edema. Mild left basilar atelectasis. IMPRESSION: 1. Left subclavian Port-A-Cath terminates in the lower third of the superior vena cava. No pneumothorax. 2. Mild left basilar atelectasis. Electronically Signed   By: Ilona Sorrel M.D.   On: 11/01/2015 11:01   Dg Fluoro Guide Cv Line-no Report  11/01/2015  CLINICAL DATA:  FLOURO GUIDE CV LINE Fluoroscopy was utilized by the  requesting physician.  No radiographic interpretation.    ASSESSMENT: 55 y.o. [place] woman status post right breast biopsy 3 and axillary lymph node biopsy 08/02/2015 for a clinical mpT1c N0, s invasive ductal carcinoma, grade 1 or 2, estrogen receptor 95% positive, progesterone receptor 5% positive, with an MIB-1 between 5 and 10%, and HER-2 equivocal on 1 of the 2 biopsies (signals ratio 1.30, number per cell 4.0).tage IA   (1) status post right modified radical mastectomy 10/01/2015 for an mpT2 pN0, stage IIA invasive ductal carcinoma, grade 2, with a total of 20 benign lymph nodes removed  (2) Oncotype DX score of 32 predicts an outside the breast recurrence risk of 22% within 10 years if the patient's only systemic therapy is tamoxifen for 5 years. It also predicts significant benefit from adjuvant chemotherapy.  (3) patient to receive adjuvant doxorubicin and cyclophosphamide 4, be followed as tolerated by paclitaxel weekly 12  (a) consider PREVENT study will her  (4) postmastectomy radiation to follow as appropriate  (5) anti-estrogens to follow completion of local treatment  (a) consider PALLAS  Study  (b) consider BWEL study  PLAN: Ardelia is batting an upper respiratory infection, but this is likely viral in nature. I suggested she treat with mucinex and delsym for the cough. If she develops fevers or a change in the color of her mucus she will give [place] a call. The labs were reviewed in detail. She is going to go back on her potassium supplement for a level of 3.3 today.   Next week she plans to go out of town for her marriage anniversary, so she will have a break accordingly on 3/13.   Kameka will return in 2 weeks for cycle 3 of doxorubicin and cyclophosphamide. She understands and agrees with this plan. She knows the goal of treatment in her case is cure. She has been encouraged to call with any issues that might arise before her next visit here.   Laurie Panda, NP    11/26/2015 1:53 PM

## 2015-11-30 ENCOUNTER — Encounter: Payer: Self-pay | Admitting: Nurse Practitioner

## 2015-11-30 ENCOUNTER — Other Ambulatory Visit: Payer: Self-pay

## 2015-11-30 ENCOUNTER — Ambulatory Visit (HOSPITAL_COMMUNITY)
Admission: RE | Admit: 2015-11-30 | Discharge: 2015-11-30 | Disposition: A | Discharge status: Home / Self Care | Payer: 59 | Source: Ambulatory Visit | Attending: Nurse Practitioner | Admitting: Nurse Practitioner

## 2015-11-30 ENCOUNTER — Ambulatory Visit (HOSPITAL_BASED_OUTPATIENT_CLINIC_OR_DEPARTMENT_OTHER): Payer: 59 | Admitting: Nurse Practitioner

## 2015-11-30 ENCOUNTER — Telehealth: Payer: Self-pay

## 2015-11-30 DIAGNOSIS — C50311 Malignant neoplasm of lower-inner quadrant of right female breast: Secondary | ICD-10-CM

## 2015-11-30 DIAGNOSIS — R1030 Lower abdominal pain, unspecified: Secondary | ICD-10-CM

## 2015-11-30 DIAGNOSIS — M1990 Unspecified osteoarthritis, unspecified site: Secondary | ICD-10-CM | POA: Diagnosis not present

## 2015-11-30 DIAGNOSIS — R1032 Left lower quadrant pain: Secondary | ICD-10-CM | POA: Insufficient documentation

## 2015-11-30 NOTE — Assessment & Plan Note (Signed)
Patient is status post right mastectomy.  She received cycle 2 of her dose dense AC chemotherapy on 11/19/2015.  She received Neulasta on prior for growth factor support at that same time.  Patient took a short break from her chemotherapy to celebrate her wedding anniversary.  Patient has plans to return for labs, visit, and her next cycle of chemotherapy on 12/10/2015.

## 2015-11-30 NOTE — Assessment & Plan Note (Addendum)
Patient has a history of chronic degenerative joint disease/arthritis; and takes methadone as needed for pain control.  She typically ambulates with the assistance of a cane.  Patient states that she awoke yesterday with severe pain to her left groin region; that is radiating down her leg.  She denies any calf pain, chest pain/chest pressure, shortness of breath, or pain with inspiration.  She also denies any known injury or trauma to her leg.  Patient has a history of the same left groin pain in the past; with noted left hip x-ray on 02/03/2015-which was negative for any acute findings.   exam today revealed some mild tenderness to the left groin region with deep palpation; but no obvious injury or trauma to the site.  There was no edema, erythema, warmth, or red streaks to the leg.  Patient was noted to have an obvious limp with a left leg.  Doppler ultrasound of the left lower extremity obtained today revealed no acute DVT; but did note some questionable enlarged left inguinal lymph nodes.  Reviewed Doppler ultrasound results with the patient; and discussed possibility of obtaining a hip x-ray again for further evaluation.  Agree with.  Patient's decision to hold on obtaining any further x-rays over the weekend.  Quite possibly-patient's increased pain could be secondary to patient's Neulasta injection for growth factor support.  The plan is for the patient to return to the symptom management clinic on Monday for follow-up.  She will continue to take her methadone as directed for her pain.  Also advised patient to go directly to the emergency department over the weekend if she develops any worsening pain or  Has other acute issues.  Patient stated understanding of all instructions; and was in agreement with this plan of care.

## 2015-11-30 NOTE — Telephone Encounter (Signed)
Patient called with new onset of pain in the groin area, left greater then right, rating pain 10/10 after taking methadone.  Patient states that when sitting or laying down pain subsides but not completely.  Patient denies redness, tenderness or swelling in the area where she is feeling the pain. Patient to be evaluated by Cyndee, NP in [facility] today.

## 2015-11-30 NOTE — Progress Notes (Signed)
*  Preliminary Results* Left lower extremity venous duplex completed. Left lower extremity is negative for deep vein thrombosis. There is no evidence of left Baker's cyst.  There is a heterogenous area of the left groin measuring 3.4cm, suggestive of a possible enlarged inguinal lymph node.  11/30/2015 5:25 PM  Maudry Mayhew, RVT, RDCS, RDMS

## 2015-11-30 NOTE — Progress Notes (Deleted)
*  Preliminary Results* Left lower extremity venous duplex completed. Left lower extremity is negative for deep vein thrombosis. There is no evidence of left Baker's cyst.  11/30/2015 5:46 PM  Maudry Mayhew, RVT, RDCS, RDMS

## 2015-11-30 NOTE — Progress Notes (Signed)
SYMPTOM MANAGEMENT CLINIC   HPI: [name] 55 y.o. female diagnosed with breast cancer.  Patient is status post mastectomy.  Currently undergoing dose dense AC chemotherapy regimen. Patient has a history of chronic degenerative joint disease/arthritis; and takes methadone as needed for pain control.  She typically ambulates with the assistance of a cane.  Patient states that she awoke yesterday with severe pain to her left groin region; that is radiating down her leg.  She denies any calf pain, chest pain/chest pressure, shortness of breath, or pain with inspiration.  She also denies any known injury or trauma to her leg.  Patient has a history of the same left groin pain in the past; with noted left hip x-ray on 02/03/2015-which was negative for any acute findings.   exam today revealed some mild tenderness to the left groin region with deep palpation; but no obvious injury or trauma to the site.  There was no edema, erythema, warmth, or red streaks to the leg.  Patient was noted to have an obvious limp with a left leg.  Doppler ultrasound of the left lower extremity obtained today revealed no acute DVT; but did note some questionable enlarged left inguinal lymph nodes.  Reviewed Doppler ultrasound results with the patient; and discussed possibility of obtaining a hip x-ray again for further evaluation.  Agree with.  Patient's decision to hold on obtaining any further x-rays over the weekend.  Quite possibly-patient's increased pain could be secondary to patient's Neulasta injection for growth factor support.  The plan is for the patient to return to the symptom management clinic on Monday for follow-up.  She will continue to take her methadone as directed for her pain.  Also advised patient to go directly to the emergency department over the weekend if she develops any worsening pain or  Has other acute issues.  Patient stated understanding of all instructions; and was in agreement  with this plan of care.    HPI  Review of Systems  Constitutional: Negative for fever and chills.  Musculoskeletal:       Patient has a history of chronic degenerative joint disease/arthritis.  Now complaining of acute left groin pain that radiates down her leg at times.  All other systems reviewed and are negative.   Past Medical History  Diagnosis Date  . Osteoarthritis   . Headache(784.0)     migraine like per patient  . Hypertension   . Low back pain     dr phillips, pain management  . Degenerative joint disease   . Gallstones   . GERD (gastroesophageal reflux disease)   . DDD (degenerative disc disease), cervical   . Colon polyps   . Diverticulosis   . Diabetes mellitus without complication ([place])   . Breast cancer of lower-inner quadrant of right female breast ([place]) 08/08/2015  . Breast cancer [facility])     Past Surgical History  Procedure Laterality Date  . Replacement total knee Right     right - dr Novella Olive  . Ganglion cyst excision Right     right wrist  . Lipoma excision Right 02/21/08    right hip, [facility]  . Replacement total knee Left 06/25/09    left - dr Marciano Sequin [facility]  . Tonsillectomy    . Joint replacement      both knees have been done, four times each per patient. Her body rejects the implant, no infection.  . Abdominal hysterectomy  06/22/06    lap, dr Mancel Bale  . Other  surgical history      lipoma removed from back 2012   . Cholecystectomy  10/28/2011    Procedure: LAPAROSCOPIC CHOLECYSTECTOMY WITH INTRAOPERATIVE CHOLANGIOGRAM;  Surgeon: Earnstine Regal, MD;  Location: [facility];  Service: General;  Laterality: N/A;  . Mass excision  08/24/2012    Procedure: EXCISION MASS;  Surgeon: Earnstine Regal, MD;  Location: [place];  Service: General;  Laterality: Left;  excisie soft tissue masses left shoulder(back) & left upper arm  . Colonoscopy  02-03-13    per Dr. Henrene Pastor, diverticuli but no polyps, repeat in 5 yrs   . Mass excision Left 01/24/2014     Procedure: EXCISION SOFT TISSUE MASSES LOWER LEFT BACK;  Surgeon: Earnstine Regal, MD;  Location: [place];  Service: General;  Laterality: Left;  . Esophagogastroduodenoscopy  02-03-13    per Dr. Henrene Pastor, normal   . Cardiac catheterization      back in 200 by Dr. Stanford Breed  . Tonsillectomy    . Mastectomy w/ sentinel node biopsy Right 10/02/2015  . Mastectomy w/ sentinel node biopsy Right 10/01/2015    Procedure: RIGHT MASTECTOMY WITH SENTINEL LYMPH NODE BIOPSY;  Surgeon: Autumn Messing III, MD;  Location: [place];  Service: General;  Laterality: Right;  . Portacath placement Left 11/01/2015    Procedure: INSERTION PORT-A-CATH;  Surgeon: Autumn Messing III, MD;  Location: [place];  Service: General;  Laterality: Left;    has GOUT; HYPERTENSION; GERD; HEADACHE; Hypokalemia; Cholelithiasis with cholecystitis; Lightheadedness; Neoplasm of soft tissue, left shoulder and left upper arm; Internal hemorrhoid; Abdominal pain, other specified site; Paraspinous mass, left; Diabetes mellitus without complication ([place]); Breast cancer of lower-inner quadrant of right female breast ([place]); and Severe obesity (BMI >= 40) ([facility]) on her problem list.    is allergic to codeine; latex; and sulfonamide derivatives.    Medication List       This list is accurate as of: 11/30/15  4:48 PM.  Always use your most recent med list.               amitriptyline 25 MG tablet  Commonly known as:  ELAVIL  Take 25 mg by mouth at bedtime.     amLODipine 5 MG tablet  Commonly known as:  NORVASC  Take 1 tablet (5 mg total) by mouth 2 (two) times daily.     furosemide 20 MG tablet  Commonly known as:  LASIX  TAKE 1 TABLET BY MOUTH EVERY DAY     lidocaine-prilocaine cream  Commonly known as:  EMLA  Apply 1 application topically as needed.     lisinopril 10 MG tablet  Commonly known as:  PRINIVIL,ZESTRIL  Take 1 tablet (10 mg total) by mouth daily.     metFORMIN 500 MG tablet  Commonly known as:   GLUCOPHAGE  Take 1 tablet (500 mg total) by mouth 2 (two) times daily with a meal.     methadone 10 MG tablet  Commonly known as:  DOLOPHINE  Take 10 mg by mouth every 8 (eight) hours as needed for moderate pain.     metoprolol 50 MG tablet  Commonly known as:  LOPRESSOR  Take 100 mg by mouth 2 (two) times daily.     omeprazole 40 MG capsule  Commonly known as:  PRILOSEC  Take 1 capsule (40 mg total) by mouth daily.     ondansetron 8 MG tablet  Commonly known as:  ZOFRAN  Take 1 tablet (8 mg total)  by mouth 2 (two) times daily as needed. Start on the third day after chemotherapy.     oxyCODONE 15 MG immediate release tablet  Commonly known as:  ROXICODONE     potassium chloride 10 MEQ tablet  Commonly known as:  K-DUR  Take 3 tablets (30 mEq total) by mouth 2 (two) times daily.     prochlorperazine 10 MG tablet  Commonly known as:  COMPAZINE  Take 1 tablet (10 mg total) by mouth every 6 (six) hours as needed (Nausea or vomiting).     temazepam 30 MG capsule  Commonly known as:  RESTORIL  TAKE ONE CAPSULE BY MOUTH AT BEDTIME AS NEEDED FOR SLEEP         PHYSICAL EXAMINATION  Oncology Vitals 11/26/2015 11/19/2015  Height - -  Weight 114.125 kg -  Weight (lbs) 251 lbs 10 oz -  BMI (kg/m2) - -  Temp 98.4 98.1  Pulse 72 64  Resp 18 22  SpO2 100 95  BSA (m2) - -   BP Readings from Last 2 Encounters:  11/26/15 143/89  11/19/15 157/78    Physical Exam  Constitutional: She is oriented to person, place, and time and well-developed, well-nourished, and in no distress.  HENT:  Head: Normocephalic and atraumatic.  Mouth/Throat: Oropharynx is clear and moist.  Eyes: Conjunctivae and EOM are normal. Pupils are equal, round, and reactive to light. Right eye exhibits no discharge. Left eye exhibits no discharge. No scleral icterus.  Neck: Normal range of motion. Neck supple.  Cardiovascular: Normal rate, regular rhythm, normal heart sounds and intact distal pulses.     Pulmonary/Chest: Effort normal and breath sounds normal. No respiratory distress.  Abdominal: Soft. Bowel sounds are normal.  Musculoskeletal: She exhibits tenderness. She exhibits no edema.   exam today revealed some mild tenderness to the left groin region with deep palpation; but no obvious injury or trauma to the site.  There was no edema, erythema, warmth, or red streaks to the leg.  Patient was noted to have an obvious limp with a left leg.       Neurological: She is alert and oriented to person, place, and time.  Skin: Skin is warm and dry. No rash noted. No erythema. No pallor.  Psychiatric: Affect normal.    LABORATORY DATA:. No visits with results within 3 Day(s) from this visit. Latest known visit with results is:  Appointment on 11/26/2015  Component Date Value Ref Range Status  . WBC 11/26/2015 7.3  3.9 - 10.3 10e3/uL Final  . NEUT# 11/26/2015 4.8  1.5 - 6.5 10e3/uL Final  . HGB 11/26/2015 11.4* 11.6 - 15.9 g/dL Final  . HCT 11/26/2015 35.1  34.8 - 46.6 % Final  . Platelets 11/26/2015 238  145 - 400 10e3/uL Final  . MCV 11/26/2015 86.9  79.5 - 101.0 fL Final  . MCH 11/26/2015 28.2  25.1 - 34.0 pg Final  . MCHC 11/26/2015 32.5  31.5 - 36.0 g/dL Final  . RBC 11/26/2015 4.04  3.70 - 5.45 10e6/uL Final  . RDW 11/26/2015 15.2* 11.2 - 14.5 % Final  . lymph# 11/26/2015 1.8  0.9 - 3.3 10e3/uL Final  . MONO# 11/26/2015 0.6  0.1 - 0.9 10e3/uL Final  . Eosinophils Absolute 11/26/2015 0.0  0.0 - 0.5 10e3/uL Final  . Basophils Absolute 11/26/2015 0.0  0.0 - 0.1 10e3/uL Final  . NEUT% 11/26/2015 65.5  38.4 - 76.8 % Final  . LYMPH% 11/26/2015 25.1  14.0 - 49.7 % Final  .  MONO% 11/26/2015 8.4  0.0 - 14.0 % Final  . EOS% 11/26/2015 0.5  0.0 - 7.0 % Final  . BASO% 11/26/2015 0.5  0.0 - 2.0 % Final  . Sodium 11/26/2015 142  136 - 145 mEq/L Final  . Potassium 11/26/2015 3.3* 3.5 - 5.1 mEq/L Final  . Chloride 11/26/2015 106  98 - 109 mEq/L Final  . CO2 11/26/2015 26  22 - 29 mEq/L  Final  . Glucose 11/26/2015 92  70 - 140 mg/dl Final   Glucose reference range is for nonfasting patients. Fasting glucose reference range is 70- 100.  Marland Kitchen BUN 11/26/2015 10.8  7.0 - 26.0 mg/dL Final  . Creatinine 11/26/2015 0.8  0.6 - 1.1 mg/dL Final  . Total Bilirubin 11/26/2015 0.41  0.20 - 1.20 mg/dL Final  . Alkaline Phosphatase 11/26/2015 122  40 - 150 U/L Final  . AST 11/26/2015 16  5 - 34 U/L Final  . ALT 11/26/2015 10  0 - 55 U/L Final  . Total Protein 11/26/2015 7.2  6.4 - 8.3 g/dL Final  . Albumin 11/26/2015 4.1  3.5 - 5.0 g/dL Final  . Calcium 11/26/2015 9.3  8.4 - 10.4 mg/dL Final  . Anion Gap 11/26/2015 10  3 - 11 mEq/L Final  . EGFR 11/26/2015 >90  >90 ml/min/1.73 m2 Final   eGFR is calculated using the CKD-EPI Creatinine Equation (2009)   Doppler US: *Preliminary Results* Left lower extremity venous duplex completed. Left lower extremity is negative for deep vein thrombosis. There is no evidence of left Baker's cyst.  There is a heterogenous area of the left groin measuring 3.4cm, suggestive of a possible enlarged inguinal lymph node.  RADIOGRAPHIC STUDIES: No results found.  ASSESSMENT/PLAN:    No problem-specific assessment & plan notes found for this encounter.  Patient stated understanding of all instructions; and was in agreement with this plan of care. The patient knows to call the clinic with any problems, questions or concerns.   Review/collaboration with Dr. Jana Hakim regarding all aspects of patient's visit today.   Total time spent with patient was 40 minutes;  with greater than 75 percent of that time spent in face to face counseling regarding patient's symptoms,  and coordination of care and follow up.  Disclaimer:This dictation was prepared with Dragon/digital dictation along with [employer]. Any transcriptional errors that result from this process are unintentional.  Drue Second, NP 11/30/2015

## 2015-12-03 ENCOUNTER — Ambulatory Visit (HOSPITAL_BASED_OUTPATIENT_CLINIC_OR_DEPARTMENT_OTHER): Payer: 59 | Admitting: Nurse Practitioner

## 2015-12-03 ENCOUNTER — Ambulatory Visit: Payer: 59

## 2015-12-03 ENCOUNTER — Other Ambulatory Visit: Payer: 59

## 2015-12-03 VITALS — BP 159/75 | HR 77 | Temp 98.4°F | Resp 18 | Ht 67.0 in | Wt 256.6 lb

## 2015-12-03 DIAGNOSIS — C50311 Malignant neoplasm of lower-inner quadrant of right female breast: Secondary | ICD-10-CM

## 2015-12-03 DIAGNOSIS — M1990 Unspecified osteoarthritis, unspecified site: Secondary | ICD-10-CM | POA: Diagnosis not present

## 2015-12-03 DIAGNOSIS — R1030 Lower abdominal pain, unspecified: Secondary | ICD-10-CM

## 2015-12-03 DIAGNOSIS — R1032 Left lower quadrant pain: Secondary | ICD-10-CM

## 2015-12-03 NOTE — Assessment & Plan Note (Addendum)
Patient has a history of chronic degenerative joint disease/arthritis; and takes methadone as needed for pain control.  She typically ambulates with the assistance of a cane.  Patient states that she awoke yesterday with severe pain to her left groin region; that is radiating down her leg.  She denies any calf pain, chest pain/chest pressure, shortness of breath, or pain with inspiration.  She also denies any known injury or trauma to her leg.  Patient has a history of the same left groin pain in the past; with noted left hip x-ray on 02/03/2015-which was negative for any acute findings.   exam today revealed some mild tenderness to the left groin region with deep palpation; but no obvious injury or trauma to the site.  There was no edema, erythema, warmth, or red streaks to the leg.  Patient was noted to have an obvious limp with a left leg.  Doppler ultrasound of the left lower extremity obtained today revealed no acute DVT; but did note some questionable enlarged left inguinal lymph nodes.  Reviewed Doppler ultrasound results with the patient; and discussed possibility of obtaining a hip x-ray again for further evaluation.  Agree with.  Patient's decision to hold on obtaining any further x-rays over the weekend.  Quite possibly-patient's increased pain could be secondary to patient's Neulasta injection for growth factor support.  The plan is for the patient to return to the symptom management clinic on Monday for follow-up.  She will continue to take her methadone as directed for her pain.  Also advised patient to go directly to the emergency department over the weekend if she develops any worsening pain or  Has other acute issues.  Patient stated understanding of all instructions; and was in agreement with this plan of care. _____________________________________________________________________  Update: Patient in for follow-up of her acute onset left groin pain last week.  She states that  the pain has greatly improved over the weekend.  She continues to take her pain medicine as prescribed.  She states that she is no longer limping when walking.  She denies any other new symptoms whatsoever.  Patient is scheduled to return next week for follow-up as well.  Advised patient to call/return in the interim she develops any new or worsening symptoms whatsoever.  May still need to consider further imaging if pain continues.

## 2015-12-04 ENCOUNTER — Encounter: Payer: Self-pay | Admitting: Nurse Practitioner

## 2015-12-04 NOTE — Progress Notes (Signed)
SYMPTOM MANAGEMENT CLINIC   HPI: [name] 55 y.o. female diagnosed with breast cancer.  Currently undergoing dose dense AC chemotherapy regimen.  Patient in for recheck of her acute onset left groin pain.  She states that the pain has greatly improved within the past weekend.  Doppler ultrasound of the left lower extremity obtained at the end of last week was negative for DVT; it did reveal some enlarged left inguinal lymph nodes.  Patient continues to take her pain medication as previously described.  She denies any other new symptoms whatsoever.   Patient has a history of chronic degenerative joint disease.  HPI  ROS  Past Medical History  Diagnosis Date  . Osteoarthritis   . Headache(784.0)     migraine like per patient  . Hypertension   . Low back pain     dr phillips, pain management  . Degenerative joint disease   . Gallstones   . GERD (gastroesophageal reflux disease)   . DDD (degenerative disc disease), cervical   . Colon polyps   . Diverticulosis   . Diabetes mellitus without complication ([place])   . Breast cancer of lower-inner quadrant of right female breast ([place]) 08/08/2015  . Breast cancer [facility])     Past Surgical History  Procedure Laterality Date  . Replacement total knee Right     right - dr Novella Olive  . Ganglion cyst excision Right     right wrist  . Lipoma excision Right 02/21/08    right hip, [facility]  . Replacement total knee Left 06/25/09    left - dr Marciano Sequin [facility]  . Tonsillectomy    . Joint replacement      both knees have been done, four times each per patient. Her body rejects the implant, no infection.  . Abdominal hysterectomy  06/22/06    lap, dr Mancel Bale  . Other surgical history      lipoma removed from back 2012   . Cholecystectomy  10/28/2011    Procedure: LAPAROSCOPIC CHOLECYSTECTOMY WITH INTRAOPERATIVE CHOLANGIOGRAM;  Surgeon: Earnstine Regal, MD;  Location: [facility];  Service: General;  Laterality: N/A;  . Mass excision  08/24/2012   Procedure: EXCISION MASS;  Surgeon: Earnstine Regal, MD;  Location: [place];  Service: General;  Laterality: Left;  excisie soft tissue masses left shoulder(back) & left upper arm  . Colonoscopy  02-03-13    per Dr. Henrene Pastor, diverticuli but no polyps, repeat in 5 yrs   . Mass excision Left 01/24/2014    Procedure: EXCISION SOFT TISSUE MASSES LOWER LEFT BACK;  Surgeon: Earnstine Regal, MD;  Location: [place];  Service: General;  Laterality: Left;  . Esophagogastroduodenoscopy  02-03-13    per Dr. Henrene Pastor, normal   . Cardiac catheterization      back in 200 by Dr. Stanford Breed  . Tonsillectomy    . Mastectomy w/ sentinel node biopsy Right 10/02/2015  . Mastectomy w/ sentinel node biopsy Right 10/01/2015    Procedure: RIGHT MASTECTOMY WITH SENTINEL LYMPH NODE BIOPSY;  Surgeon: Autumn Messing III, MD;  Location: [place];  Service: General;  Laterality: Right;  . Portacath placement Left 11/01/2015    Procedure: INSERTION PORT-A-CATH;  Surgeon: Autumn Messing III, MD;  Location: [place];  Service: General;  Laterality: Left;    has GOUT; HYPERTENSION; GERD; HEADACHE; Hypokalemia; Cholelithiasis with cholecystitis; Lightheadedness; Neoplasm of soft tissue, left shoulder and left upper arm; Internal hemorrhoid; Abdominal pain, other specified site; Paraspinous mass, left;  Diabetes mellitus without complication ([place]); Breast cancer of lower-inner quadrant of right female breast ([place]); Severe obesity (BMI >= 40) ([facility]); and Left groin pain on her problem list.    is allergic to codeine; latex; and sulfonamide derivatives.    Medication List       This list is accurate as of: 12/03/15 11:59 PM.  Always use your most recent med list.               amitriptyline 25 MG tablet  Commonly known as:  ELAVIL  Take 25 mg by mouth at bedtime.     amLODipine 5 MG tablet  Commonly known as:  NORVASC  Take 1 tablet (5 mg total) by mouth 2 (two) times daily.     furosemide 20 MG  tablet  Commonly known as:  LASIX  TAKE 1 TABLET BY MOUTH EVERY DAY     lidocaine-prilocaine cream  Commonly known as:  EMLA  Apply 1 application topically as needed.     lisinopril 10 MG tablet  Commonly known as:  PRINIVIL,ZESTRIL  Take 1 tablet (10 mg total) by mouth daily.     metFORMIN 500 MG tablet  Commonly known as:  GLUCOPHAGE  Take 1 tablet (500 mg total) by mouth 2 (two) times daily with a meal.     methadone 10 MG tablet  Commonly known as:  DOLOPHINE  Take 10 mg by mouth every 8 (eight) hours as needed for moderate pain.     metoprolol 50 MG tablet  Commonly known as:  LOPRESSOR  Take 100 mg by mouth 2 (two) times daily.     omeprazole 40 MG capsule  Commonly known as:  PRILOSEC  Take 1 capsule (40 mg total) by mouth daily.     ondansetron 8 MG tablet  Commonly known as:  ZOFRAN  Take 1 tablet (8 mg total) by mouth 2 (two) times daily as needed. Start on the third day after chemotherapy.     oxyCODONE 15 MG immediate release tablet  Commonly known as:  ROXICODONE     potassium chloride 10 MEQ tablet  Commonly known as:  K-DUR  Take 3 tablets (30 mEq total) by mouth 2 (two) times daily.     prochlorperazine 10 MG tablet  Commonly known as:  COMPAZINE  Take 1 tablet (10 mg total) by mouth every 6 (six) hours as needed (Nausea or vomiting).     temazepam 30 MG capsule  Commonly known as:  RESTORIL  TAKE ONE CAPSULE BY MOUTH AT BEDTIME AS NEEDED FOR SLEEP         PHYSICAL EXAMINATION  Oncology Vitals 12/03/2015 11/26/2015  Height 170 cm -  Weight 116.393 kg 114.125 kg  Weight (lbs) 256 lbs 10 oz 251 lbs 10 oz  BMI (kg/m2) 40.19 kg/m2 -  Temp 98.4 98.4  Pulse 77 72  Resp 18 18  SpO2 99 100  BSA (m2) 2.35 m2 -   BP Readings from Last 2 Encounters:  12/03/15 159/75  11/26/15 143/89    Physical Exam  Constitutional: She is oriented to person, place, and time and well-developed, well-nourished, and in no distress.  HENT:  Head: Normocephalic and  atraumatic.  Eyes: Conjunctivae and EOM are normal. Pupils are equal, round, and reactive to light. Right eye exhibits no discharge. Left eye exhibits no discharge. No scleral icterus.  Neck: Normal range of motion.  Pulmonary/Chest: Effort normal. No respiratory distress.  Musculoskeletal: Normal range of motion. She exhibits no edema or tenderness.  Neurological: She is alert and oriented to person, place, and time. Gait normal.  Skin: Skin is warm and dry.  Psychiatric: Affect normal.  Nursing note and vitals reviewed.   LABORATORY DATA:. No visits with results within 3 Day(s) from this visit. Latest known visit with results is:  Appointment on 11/26/2015  Component Date Value Ref Range Status  . WBC 11/26/2015 7.3  3.9 - 10.3 10e3/uL Final  . NEUT# 11/26/2015 4.8  1.5 - 6.5 10e3/uL Final  . HGB 11/26/2015 11.4* 11.6 - 15.9 g/dL Final  . HCT 11/26/2015 35.1  34.8 - 46.6 % Final  . Platelets 11/26/2015 238  145 - 400 10e3/uL Final  . MCV 11/26/2015 86.9  79.5 - 101.0 fL Final  . MCH 11/26/2015 28.2  25.1 - 34.0 pg Final  . MCHC 11/26/2015 32.5  31.5 - 36.0 g/dL Final  . RBC 11/26/2015 4.04  3.70 - 5.45 10e6/uL Final  . RDW 11/26/2015 15.2* 11.2 - 14.5 % Final  . lymph# 11/26/2015 1.8  0.9 - 3.3 10e3/uL Final  . MONO# 11/26/2015 0.6  0.1 - 0.9 10e3/uL Final  . Eosinophils Absolute 11/26/2015 0.0  0.0 - 0.5 10e3/uL Final  . Basophils Absolute 11/26/2015 0.0  0.0 - 0.1 10e3/uL Final  . NEUT% 11/26/2015 65.5  38.4 - 76.8 % Final  . LYMPH% 11/26/2015 25.1  14.0 - 49.7 % Final  . MONO% 11/26/2015 8.4  0.0 - 14.0 % Final  . EOS% 11/26/2015 0.5  0.0 - 7.0 % Final  . BASO% 11/26/2015 0.5  0.0 - 2.0 % Final  . Sodium 11/26/2015 142  136 - 145 mEq/L Final  . Potassium 11/26/2015 3.3* 3.5 - 5.1 mEq/L Final  . Chloride 11/26/2015 106  98 - 109 mEq/L Final  . CO2 11/26/2015 26  22 - 29 mEq/L Final  . Glucose 11/26/2015 92  70 - 140 mg/dl Final   Glucose reference range is for nonfasting  patients. Fasting glucose reference range is 70- 100.  Marland Kitchen BUN 11/26/2015 10.8  7.0 - 26.0 mg/dL Final  . Creatinine 11/26/2015 0.8  0.6 - 1.1 mg/dL Final  . Total Bilirubin 11/26/2015 0.41  0.20 - 1.20 mg/dL Final  . Alkaline Phosphatase 11/26/2015 122  40 - 150 U/L Final  . AST 11/26/2015 16  5 - 34 U/L Final  . ALT 11/26/2015 10  0 - 55 U/L Final  . Total Protein 11/26/2015 7.2  6.4 - 8.3 g/dL Final  . Albumin 11/26/2015 4.1  3.5 - 5.0 g/dL Final  . Calcium 11/26/2015 9.3  8.4 - 10.4 mg/dL Final  . Anion Gap 11/26/2015 10  3 - 11 mEq/L Final  . EGFR 11/26/2015 >90  >90 ml/min/1.73 m2 Final   eGFR is calculated using the CKD-EPI Creatinine Equation (2009)     RADIOGRAPHIC STUDIES: No results found.  ASSESSMENT/PLAN:    Left groin pain Patient has a history of chronic degenerative joint disease/arthritis; and takes methadone as needed for pain control.  She typically ambulates with the assistance of a cane.  Patient states that she awoke yesterday with severe pain to her left groin region; that is radiating down her leg.  She denies any calf pain, chest pain/chest pressure, shortness of breath, or pain with inspiration.  She also denies any known injury or trauma to her leg.  Patient has a history of the same left groin pain in the past; with noted left hip x-ray on 02/03/2015-which was negative for any acute findings.   exam today  revealed some mild tenderness to the left groin region with deep palpation; but no obvious injury or trauma to the site.  There was no edema, erythema, warmth, or red streaks to the leg.  Patient was noted to have an obvious limp with a left leg.  Doppler ultrasound of the left lower extremity obtained today revealed no acute DVT; but did note some questionable enlarged left inguinal lymph nodes.  Reviewed Doppler ultrasound results with the patient; and discussed possibility of obtaining a hip x-ray again for further evaluation.  Agree with.  Patient's  decision to hold on obtaining any further x-rays over the weekend.  Quite possibly-patient's increased pain could be secondary to patient's Neulasta injection for growth factor support.  The plan is for the patient to return to the symptom management clinic on Monday for follow-up.  She will continue to take her methadone as directed for her pain.  Also advised patient to go directly to the emergency department over the weekend if she develops any worsening pain or  Has other acute issues.  Patient stated understanding of all instructions; and was in agreement with this plan of care. _____________________________________________________________________  Update: Patient in for follow-up of her acute onset left groin pain last week.  She states that the pain has greatly improved over the weekend.  She continues to take her pain medicine as prescribed.  She states that she is no longer limping when walking.  She denies any other new symptoms whatsoever.  Patient is scheduled to return next week for follow-up as well.  Advised patient to call/return in the interim she develops any new or worsening symptoms whatsoever.  May still need to consider further imaging if pain continues.  Breast cancer of lower-inner quadrant of right female breast [facility]) Patient is status post right mastectomy.  She received cycle 2 of her dose dense AC chemotherapy on 11/19/2015.  She received Neulasta on prior for growth factor support at that same time.  Patient took a short break from her chemotherapy to celebrate her wedding anniversary.  Patient has plans to return for labs, visit, and her next cycle of chemotherapy on 12/10/2015.    Patient stated understanding of all instructions; and was in agreement with this plan of care. The patient knows to call the clinic with any problems, questions or concerns.   Review/collaboration with Dr. Jana Hakim regarding all aspects of patient's visit today.   Total time  spent with patient was 15 minutes;  with greater than 75 percent of that time spent in face to face counseling regarding patient's symptoms,  and coordination of care and follow up.  Disclaimer:This dictation was prepared with Dragon/digital dictation along with [employer]. Any transcriptional errors that result from this process are unintentional.  Drue Second, NP 12/04/2015

## 2015-12-04 NOTE — Assessment & Plan Note (Signed)
Patient is status post right mastectomy.  She received cycle 2 of her dose dense AC chemotherapy on 11/19/2015.  She received Neulasta on prior for growth factor support at that same time.  Patient took a short break from her chemotherapy to celebrate her wedding anniversary.  Patient has plans to return for labs, visit, and her next cycle of chemotherapy on 12/10/2015.

## 2015-12-10 ENCOUNTER — Other Ambulatory Visit (HOSPITAL_BASED_OUTPATIENT_CLINIC_OR_DEPARTMENT_OTHER): Payer: 59

## 2015-12-10 ENCOUNTER — Ambulatory Visit (HOSPITAL_BASED_OUTPATIENT_CLINIC_OR_DEPARTMENT_OTHER): Payer: 59

## 2015-12-10 ENCOUNTER — Ambulatory Visit (HOSPITAL_BASED_OUTPATIENT_CLINIC_OR_DEPARTMENT_OTHER): Payer: 59 | Admitting: Nurse Practitioner

## 2015-12-10 ENCOUNTER — Encounter: Payer: Self-pay | Admitting: Nurse Practitioner

## 2015-12-10 ENCOUNTER — Telehealth: Payer: Self-pay | Admitting: Nurse Practitioner

## 2015-12-10 VITALS — BP 145/74 | HR 66 | Temp 98.1°F | Resp 18 | Ht 67.0 in | Wt 256.6 lb

## 2015-12-10 DIAGNOSIS — C50311 Malignant neoplasm of lower-inner quadrant of right female breast: Secondary | ICD-10-CM

## 2015-12-10 DIAGNOSIS — Z170 Estrogen receptor positive status [ER+]: Secondary | ICD-10-CM | POA: Diagnosis not present

## 2015-12-10 DIAGNOSIS — E876 Hypokalemia: Secondary | ICD-10-CM

## 2015-12-10 DIAGNOSIS — Z5111 Encounter for antineoplastic chemotherapy: Secondary | ICD-10-CM | POA: Diagnosis not present

## 2015-12-10 LAB — CBC WITH DIFFERENTIAL/PLATELET
BASO%: 0.2 % (ref 0.0–2.0)
Basophils Absolute: 0 10*3/uL (ref 0.0–0.1)
EOS%: 1.3 % (ref 0.0–7.0)
Eosinophils Absolute: 0.1 10*3/uL (ref 0.0–0.5)
HCT: 34.6 % — ABNORMAL LOW (ref 34.8–46.6)
HGB: 11.5 g/dL — ABNORMAL LOW (ref 11.6–15.9)
LYMPH%: 28.9 % (ref 14.0–49.7)
MCH: 29.4 pg (ref 25.1–34.0)
MCHC: 33.2 g/dL (ref 31.5–36.0)
MCV: 88.5 fL (ref 79.5–101.0)
MONO#: 0.7 10*3/uL (ref 0.1–0.9)
MONO%: 7.3 % (ref 0.0–14.0)
NEUT#: 5.7 10*3/uL (ref 1.5–6.5)
NEUT%: 62.3 % (ref 38.4–76.8)
Platelets: 311 10*3/uL (ref 145–400)
RBC: 3.91 10*6/uL (ref 3.70–5.45)
RDW: 16.4 % — ABNORMAL HIGH (ref 11.2–14.5)
WBC: 9.2 10*3/uL (ref 3.9–10.3)
lymph#: 2.7 10*3/uL (ref 0.9–3.3)

## 2015-12-10 LAB — COMPREHENSIVE METABOLIC PANEL
ALT: 15 U/L (ref 0–55)
AST: 17 U/L (ref 5–34)
Albumin: 4.1 g/dL (ref 3.5–5.0)
Alkaline Phosphatase: 73 U/L (ref 40–150)
Anion Gap: 11 mEq/L (ref 3–11)
BUN: 13.7 mg/dL (ref 7.0–26.0)
CO2: 29 mEq/L (ref 22–29)
Calcium: 9.2 mg/dL (ref 8.4–10.4)
Chloride: 103 mEq/L (ref 98–109)
Creatinine: 0.9 mg/dL (ref 0.6–1.1)
EGFR: 82 mL/min/{1.73_m2} — ABNORMAL LOW (ref >90)
Glucose: 100 mg/dl (ref 70–140)
Potassium: 3.1 mEq/L — ABNORMAL LOW (ref 3.5–5.1)
Sodium: 143 mEq/L (ref 136–145)
Total Bilirubin: <0.3 mg/dL (ref 0.20–1.20)
Total Protein: 7.2 g/dL (ref 6.4–8.3)

## 2015-12-10 MED ORDER — SODIUM CHLORIDE 0.9 % IV SOLN
Freq: Once | INTRAVENOUS | Status: AC
  Administered 2015-12-10: 15:00:00 via INTRAVENOUS

## 2015-12-10 MED ORDER — SODIUM CHLORIDE 0.9% FLUSH
10.0000 mL | INTRAVENOUS | Status: DC | PRN
  Administered 2015-12-10: 10 mL
  Filled 2015-12-10: qty 10

## 2015-12-10 MED ORDER — PEGFILGRASTIM 6 MG/0.6ML ~~LOC~~ PSKT
6.0000 mg | PREFILLED_SYRINGE | Freq: Once | SUBCUTANEOUS | Status: AC
  Administered 2015-12-10: 6 mg via SUBCUTANEOUS
  Filled 2015-12-10: qty 0.6

## 2015-12-10 MED ORDER — PALONOSETRON HCL INJECTION 0.25 MG/5ML
0.2500 mg | Freq: Once | INTRAVENOUS | Status: AC
  Administered 2015-12-10: 0.25 mg via INTRAVENOUS

## 2015-12-10 MED ORDER — DOXORUBICIN HCL CHEMO IV INJECTION 2 MG/ML
60.0000 mg/m2 | Freq: Once | INTRAVENOUS | Status: AC
  Administered 2015-12-10: 140 mg via INTRAVENOUS
  Filled 2015-12-10: qty 70

## 2015-12-10 MED ORDER — SODIUM CHLORIDE 0.9 % IV SOLN
600.0000 mg/m2 | Freq: Once | INTRAVENOUS | Status: AC
  Administered 2015-12-10: 1400 mg via INTRAVENOUS
  Filled 2015-12-10: qty 70

## 2015-12-10 MED ORDER — PALONOSETRON HCL INJECTION 0.25 MG/5ML
INTRAVENOUS | Status: AC
  Filled 2015-12-10: qty 5

## 2015-12-10 MED ORDER — SODIUM CHLORIDE 0.9 % IV SOLN
Freq: Once | INTRAVENOUS | Status: AC
  Administered 2015-12-10: 15:00:00 via INTRAVENOUS
  Filled 2015-12-10: qty 5

## 2015-12-10 MED ORDER — HEPARIN SOD (PORK) LOCK FLUSH 100 UNIT/ML IV SOLN
500.0000 [IU] | Freq: Once | INTRAVENOUS | Status: AC | PRN
  Administered 2015-12-10: 500 [IU]
  Filled 2015-12-10: qty 5

## 2015-12-10 NOTE — Patient Instructions (Signed)
[facility] Discharge Instructions for Patients Receiving Chemotherapy  Today you received the following chemotherapy agents: Adriamycin and Cytoxan.  To help prevent nausea and vomiting after your treatment, we encourage you to take your nausea medication: Compazine 10 mg every 6 hours as needed; Zofran 8 mg every 12 hours as needed.   If you develop nausea and vomiting that is not controlled by your nausea medication, call the clinic.   BELOW ARE SYMPTOMS THAT SHOULD BE REPORTED IMMEDIATELY:  *FEVER GREATER THAN 100.5 F  *CHILLS WITH OR WITHOUT FEVER  NAUSEA AND VOMITING THAT IS NOT CONTROLLED WITH YOUR NAUSEA MEDICATION  *UNUSUAL SHORTNESS OF BREATH  *UNUSUAL BRUISING OR BLEEDING  TENDERNESS IN MOUTH AND THROAT WITH OR WITHOUT PRESENCE OF ULCERS  *URINARY PROBLEMS  *BOWEL PROBLEMS  UNUSUAL RASH Items with * indicate a potential emergency and should be followed up as soon as possible.  Feel free to call the clinic you have any questions or concerns. The clinic phone number is [phone].  Please show the [place] at check-in to the Emergency Department and triage nurse.

## 2015-12-10 NOTE — Progress Notes (Signed)
[facility]  [phone] [phone]     ID: [name] DOB: 1961-07-07  MR#: [id number]  JYN#:[id number]  Patient Care Team: Laurey Morale, MD as PCP - [place] III, MD as Consulting Physician (General Surgery) Chauncey Cruel, MD as Consulting Physician (Oncology) Thea Silversmith, MD as Consulting Physician (Radiation Oncology) Sylvan Cheese, NP as Nurse Practitioner (Hematology and Oncology) PCP: Laurey Morale, MD GYN: OTHER MD: Mardi Mainland MD, Nicholaus Bloom MD  CHIEF COMPLAINT: Multicentric breast cancer  CURRENT TREATMENT: Adjuvant chemotherapy pending   BREAST CANCER HISTORY: From the original intake note:  [name] herself noted a change in her right breast in October and brought it to the attention of her primary care physician. Her last mammogram had been April 2010. Dr. Sarajane Jews set [name] up for bilateral diagnostic mammography with tomosynthesis and [facility] November 04/11/2015. This found the breast density to be category B. In the right breast there were 3 microlobulated masses in the lower inner quadrant measuring 1.9, 1.5, and 1.5 cm. These were multicentric. There were also diffuse fine pleomorphic calcifications surrounding these masses, that area spanning 11.6 cm. The masses were palpable by exam at 4:00 8 cm from the nipple and at 5:00 3 cm from the nipple. By ultrasonography there was an irregular hypoechoic mass in the right breast at 4:00 measuring 1.7 cm, 1 medial to this measuring 1.1 cm, and then the third irregular hypoechoic mass at the 5:00 position measuring 1.3 cm. The right axilla showed multiple lymph nodes with thickened cortices. The largest lymph node measured 0.9 cm.  On 08/02/2015, [name] underwent biopsy of all 3 breast masses as well as a right axillary lymph node. 2 of the tumors were similar invasive ductal carcinomas, grade 2, estrogen receptor  95% positive, progesterone receptor 5% positive, with an MIB-1 of 10%, and HER-2 equivocal, with a signals ratio of 1.30 and the number per cell 4.0. The third tumor appeared's grade 1 or 2 was also estrogen receptor positive at 95%, progesterone receptor positive at 5%, with an MIP-1 of 5%, and a similar HER-2 profile the signals ratio being 1.46 but the number per cell being only 3.95. By immunohistochemistry on this tumor HER-2 was negative at 1+. Immunohistochemistry on the equivocal reading is pending.  Jerrilyn's subsequent history is as detailed below  INTERVAL HISTORY: [name] returns today for follow-up of her breast cancer, accompanied by her husband. Today is day 8, cycle 3 cyclophosphamide and doxorubicin, with neulasta onpro for granulocyte support.  REVIEW OF SYSTEMS: [name] has few complaints today. She has some pain to her left leg. This is chronic in nature and aggravated by neulasta. She takes methadone for this TID. The pain has improved since last week. There is no swelling. She denies fevers, chills, nausea, vomiting, or changes in bowel or bladder habits. She is eating well. She denies mouth sores or rashes. A detailed review of systems is otherwise stable.  PAST MEDICAL HISTORY: Past Medical History  Diagnosis Date  . Osteoarthritis   . Headache(784.0)     migraine like per patient  . Hypertension   . Low back pain     dr phillips, pain management  . Degenerative joint disease   . Gallstones   . GERD (gastroesophageal reflux disease)   . DDD (degenerative disc disease), cervical   . Colon polyps   . Diverticulosis   . Diabetes mellitus without complication ([place])   . Breast  cancer of lower-inner quadrant of right female breast ([facility]) 08/08/2015  . Breast cancer ([place])     PAST SURGICAL HISTORY: Past Surgical History  Procedure Laterality Date  . Replacement total knee Right     right - dr Novella Olive  . Ganglion cyst excision Right     right wrist  . Lipoma excision  Right 02/21/08    right hip, [facility]  . Replacement total knee Left 06/25/09    left - dr Marciano Sequin [facility]  . Tonsillectomy    . Joint replacement      both knees have been done, four times each per patient. Her body rejects the implant, no infection.  . Abdominal hysterectomy  06/22/06    lap, dr Mancel Bale  . Other surgical history      lipoma removed from back 2012   . Cholecystectomy  10/28/2011    Procedure: LAPAROSCOPIC CHOLECYSTECTOMY WITH INTRAOPERATIVE CHOLANGIOGRAM;  Surgeon: Earnstine Regal, MD;  Location: [facility];  Service: General;  Laterality: N/A;  . Mass excision  08/24/2012    Procedure: EXCISION MASS;  Surgeon: Earnstine Regal, MD;  Location: [facility];  Service: General;  Laterality: Left;  excisie soft tissue masses left shoulder(back) & left upper arm  . Colonoscopy  02-03-13    per Dr. Henrene Pastor, diverticuli but no polyps, repeat in 5 yrs   . Mass excision Left 01/24/2014    Procedure: EXCISION SOFT TISSUE MASSES LOWER LEFT BACK;  Surgeon: Earnstine Regal, MD;  Location: [facility];  Service: General;  Laterality: Left;  . Esophagogastroduodenoscopy  02-03-13    per Dr. Henrene Pastor, normal   . Cardiac catheterization      back in 200 by Dr. Stanford Breed  . Tonsillectomy    . Mastectomy w/ sentinel node biopsy Right 10/02/2015  . Mastectomy w/ sentinel node biopsy Right 10/01/2015    Procedure: RIGHT MASTECTOMY WITH SENTINEL LYMPH NODE BIOPSY;  Surgeon: Autumn Messing III, MD;  Location: [facility];  Service: General;  Laterality: Right;  . Portacath placement Left 11/01/2015    Procedure: INSERTION PORT-A-CATH;  Surgeon: Autumn Messing III, MD;  Location: [facility];  Service: General;  Laterality: Left;    FAMILY HISTORY Family History  Problem Relation Age of Onset  . Crohn's disease Other     nephew  . Stomach cancer Mother   . Heart disease Father   . Prostate cancer Father   . Kidney cancer Sister     one out of the four  . Hypertension Sister   . Hypertension  Brother   . Colon cancer Maternal Grandmother   The patient's father died at the age of 44 with congestive heart failure. Her mother is still living as of November 2016, age 39. The patient had 6 brothers, 4 sisters. One sister was diagnosed with kidney cancer at age 91. ([place]). She is doing well. The patient's maternal grandmother was diagnosed with colon cancer at the age of 26. The patient's mother was diagnosed with a "rare stomach cancer" 20 years ago. The patient's father was diagnosed with prostate cancer at age 36. There is no history of breast or ovarian cancer in the family to her knowledge.   GYNECOLOGIC HISTORY:  No LMP recorded. Patient has had a hysterectomy.  menarche age 2, first live birth age 51, the patient is GX P3. She underwent remote hysterectomy with bilateral salpingo-oophorectomy. She did not use hormone replacement. She never used oral contraceptives.   SOCIAL HISTORY:  Jesus used to work as a "[employer] but she is now disabled because of her multiple arthritic and degenerative problems. Her husband Elberta Fortis A. Wilcock works as a Freight forwarder. Son Dalphine Handing works in long care in [place]. Son Evette Doffing works for a Arboriculturist in [place]. Daughter  Silva Bandy is a Haematologist in [place]. The patient has 5 grandchildren. She attends a Physicist, medical church    ADVANCED DIRECTIVES: Not in place   HEALTH MAINTENANCE: Social History  Substance Use Topics  . Smoking status: Current Every Day Smoker -- 0.25 packs/day for 20 years    Types: Cigarettes  . Smokeless tobacco: Former Systems developer    Quit date: 09/30/2015     Comment: quit  . Alcohol Use: No     Colonoscopy:February 2016   VEL:FYBOFB post hysterectomy   Bone density:  Lipid panel:  Allergies  Allergen Reactions  . Codeine Itching and Nausea And Vomiting    Itching all over the body  . Latex Hives  . Sulfonamide Derivatives Hives    All over the body     Current Outpatient Prescriptions  Medication Sig Dispense Refill  . amitriptyline (ELAVIL) 25 MG tablet Take 25 mg by mouth at bedtime.     Marland Kitchen amLODipine (NORVASC) 5 MG tablet Take 1 tablet (5 mg total) by mouth 2 (two) times daily. 180 tablet 3  . furosemide (LASIX) 20 MG tablet TAKE 1 TABLET BY MOUTH EVERY DAY 30 tablet 6  . lidocaine-prilocaine (EMLA) cream Apply 1 application topically as needed. 30 g 0  . lisinopril (PRINIVIL,ZESTRIL) 10 MG tablet Take 1 tablet (10 mg total) by mouth daily. 90 tablet 3  . metFORMIN (GLUCOPHAGE) 500 MG tablet Take 1 tablet (500 mg total) by mouth 2 (two) times daily with a meal. 180 tablet 3  . methadone (DOLOPHINE) 10 MG tablet Take 10 mg by mouth every 8 (eight) hours as needed for moderate pain.     . metoprolol (LOPRESSOR) 50 MG tablet Take 100 mg by mouth 2 (two) times daily.    Marland Kitchen omeprazole (PRILOSEC) 40 MG capsule Take 1 capsule (40 mg total) by mouth daily. 90 capsule 3  . ondansetron (ZOFRAN) 8 MG tablet Take 1 tablet (8 mg total) by mouth 2 (two) times daily as needed. Start on the third day after chemotherapy. 30 tablet 1  . oxyCODONE (ROXICODONE) 15 MG immediate release tablet Reported on 12/10/2015    . potassium chloride (K-DUR) 10 MEQ tablet Take 3 tablets (30 mEq total) by mouth 2 (two) times daily. 180 tablet 11  . prochlorperazine (COMPAZINE) 10 MG tablet Take 1 tablet (10 mg total) by mouth every 6 (six) hours as needed (Nausea or vomiting). 30 tablet 1  . temazepam (RESTORIL) 30 MG capsule TAKE ONE CAPSULE BY MOUTH AT BEDTIME AS NEEDED FOR SLEEP 90 capsule 1   No current facility-administered medications for this visit.    OBJECTIVE: middle-aged African-American woman in no acute distress Filed Vitals:   12/10/15 1331  BP: 145/74  Pulse: 66  Temp: 98.1 F (36.7 C)  Resp: 18     Body mass index is 40.18 kg/(m^2).    ECOG FS:2 - Symptomatic, <50% confined to bed  Skin: warm, dry  HEENT: sclerae anicteric, conjunctivae pink,  oropharynx clear. No thrush or mucositis.  Lymph Nodes: No cervical or supraclavicular lymphadenopathy  Lungs: clear to auscultation bilaterally, no rales, wheezes, or rhonci  Heart: regular rate and rhythm  Abdomen: round, soft, non tender, positive  bowel sounds  Musculoskeletal: No focal spinal tenderness, no peripheral edema  Neuro: non focal, well oriented, positive affect  Breasts: deferred  LAB RESULTS:  CMP     Component Value Date/Time   NA 143 12/10/2015 1307   NA 143 10/30/2015 1535   K 3.1* 12/10/2015 1307   K 3.4* 10/30/2015 1535   CL 101 10/30/2015 1535   CO2 29 12/10/2015 1307   CO2 27 10/30/2015 1535   GLUCOSE 100 12/10/2015 1307   GLUCOSE 84 10/30/2015 1535   BUN 13.7 12/10/2015 1307   BUN 8 10/30/2015 1535   CREATININE 0.9 12/10/2015 1307   CREATININE 0.79 10/30/2015 1535   CALCIUM 9.2 12/10/2015 1307   CALCIUM 9.7 10/30/2015 1535   PROT 7.2 12/10/2015 1307   PROT 7.7 06/05/2015 1837   ALBUMIN 4.1 12/10/2015 1307   ALBUMIN 4.8 06/05/2015 1837   AST 17 12/10/2015 1307   AST 24 06/05/2015 1837   ALT 15 12/10/2015 1307   ALT 14 06/05/2015 1837   ALKPHOS 73 12/10/2015 1307   ALKPHOS 69 06/05/2015 1837   BILITOT <0.30 12/10/2015 1307   BILITOT 0.5 06/05/2015 1837   GFRNONAA >60 10/30/2015 1535   GFRAA >60 10/30/2015 1535    INo results found for: SPEP, UPEP  Lab Results  Component Value Date   WBC 9.2 12/10/2015   NEUTROABS 5.7 12/10/2015   HGB 11.5* 12/10/2015   HCT 34.6* 12/10/2015   MCV 88.5 12/10/2015   PLT 311 12/10/2015      Chemistry      Component Value Date/Time   NA 143 12/10/2015 1307   NA 143 10/30/2015 1535   K 3.1* 12/10/2015 1307   K 3.4* 10/30/2015 1535   CL 101 10/30/2015 1535   CO2 29 12/10/2015 1307   CO2 27 10/30/2015 1535   BUN 13.7 12/10/2015 1307   BUN 8 10/30/2015 1535   CREATININE 0.9 12/10/2015 1307   CREATININE 0.79 10/30/2015 1535      Component Value Date/Time   CALCIUM 9.2 12/10/2015 1307   CALCIUM 9.7  10/30/2015 1535   ALKPHOS 73 12/10/2015 1307   ALKPHOS 69 06/05/2015 1837   AST 17 12/10/2015 1307   AST 24 06/05/2015 1837   ALT 15 12/10/2015 1307   ALT 14 06/05/2015 1837   BILITOT <0.30 12/10/2015 1307   BILITOT 0.5 06/05/2015 1837       No results found for: LABCA2  No components found for: LABCA125  No results for input(s): INR in the last 168 hours.  Urinalysis    Component Value Date/Time   COLORURINE YELLOW 06/05/2015 2353   APPEARANCEUR CLOUDY* 06/05/2015 2353   LABSPEC 1.007 06/05/2015 2353   PHURINE 6.5 06/05/2015 2353   GLUCOSEU NEGATIVE 06/05/2015 2353   HGBUR TRACE* 06/05/2015 2353   HGBUR large 07/01/2010 0945   BILIRUBINUR NEGATIVE 06/05/2015 2353   BILIRUBINUR n 02/26/2015 1121   KETONESUR NEGATIVE 06/05/2015 2353   PROTEINUR NEGATIVE 06/05/2015 2353   PROTEINUR n 02/26/2015 1121   UROBILINOGEN 0.2 06/05/2015 2353   UROBILINOGEN 0.2 02/26/2015 1121   NITRITE NEGATIVE 06/05/2015 2353   NITRITE n 02/26/2015 1121   LEUKOCYTESUR NEGATIVE 06/05/2015 2353   STUDIES: No results found.  ASSESSMENT: 55 y.o. [place] woman status post right breast biopsy 3 and axillary lymph node biopsy 08/02/2015 for a clinical mpT1c N0, s invasive ductal carcinoma, grade 1 or 2, estrogen receptor 95% positive, progesterone receptor 5% positive, with an MIB-1 between 5 and 10%, and HER-2 equivocal on 1 of the  2 biopsies (signals ratio 1.30, number per cell 4.0).tage IA   (1) status post right modified radical mastectomy 10/01/2015 for an mpT2 pN0, stage IIA invasive ductal carcinoma, grade 2, with a total of 20 benign lymph nodes removed  (2) Oncotype DX score of 32 predicts an outside the breast recurrence risk of 22% within 10 years if the patient's only systemic therapy is tamoxifen for 5 years. It also predicts significant benefit from adjuvant chemotherapy.  (3) patient to receive adjuvant doxorubicin and cyclophosphamide 4, be followed as tolerated by paclitaxel  weekly 12  (a) consider PREVENT study will her  (4) postmastectomy radiation to follow as appropriate  (5) anti-estrogens to follow completion of local treatment  (a) consider PALLAS  Study  (b) consider BWEL study  PLAN: Dannae is doing well today. The labs were reviewed in detail and were sufficient for treatment. Her potassium is down to 3.1. She will restart her 20 meq supplement BID at home. We will proceed with cycle 3 of doxorubicin and cyclophosphamide as planned today.  Lusero will return in 1 week for labs and a nadir visit. She understands and agrees with this plan. She knows the goal of treatment in her case is cure. She has been encouraged to call with any issues that might arise before her next visit here.   Laurie Panda, NP   12/10/2015 2:20 PM

## 2015-12-10 NOTE — Telephone Encounter (Signed)
appt made and avs printed °

## 2015-12-17 ENCOUNTER — Ambulatory Visit: Payer: 59 | Admitting: Nurse Practitioner

## 2015-12-17 ENCOUNTER — Ambulatory Visit: Payer: 59

## 2015-12-17 ENCOUNTER — Other Ambulatory Visit (HOSPITAL_BASED_OUTPATIENT_CLINIC_OR_DEPARTMENT_OTHER): Payer: 59

## 2015-12-17 ENCOUNTER — Encounter: Payer: Self-pay | Admitting: Nurse Practitioner

## 2015-12-17 ENCOUNTER — Other Ambulatory Visit: Payer: 59

## 2015-12-17 ENCOUNTER — Ambulatory Visit (HOSPITAL_BASED_OUTPATIENT_CLINIC_OR_DEPARTMENT_OTHER): Payer: 59 | Admitting: Nurse Practitioner

## 2015-12-17 VITALS — BP 143/78 | HR 73 | Temp 98.1°F | Resp 16 | Wt 255.4 lb

## 2015-12-17 DIAGNOSIS — D6481 Anemia due to antineoplastic chemotherapy: Secondary | ICD-10-CM | POA: Diagnosis not present

## 2015-12-17 DIAGNOSIS — C50311 Malignant neoplasm of lower-inner quadrant of right female breast: Secondary | ICD-10-CM

## 2015-12-17 DIAGNOSIS — Z170 Estrogen receptor positive status [ER+]: Secondary | ICD-10-CM | POA: Diagnosis not present

## 2015-12-17 DIAGNOSIS — R51 Headache: Secondary | ICD-10-CM

## 2015-12-17 LAB — CBC WITH DIFFERENTIAL/PLATELET
BASO%: 0.8 % (ref 0.0–2.0)
Basophils Absolute: 0 10*3/uL (ref 0.0–0.1)
EOS%: 2 % (ref 0.0–7.0)
Eosinophils Absolute: 0.1 10*3/uL (ref 0.0–0.5)
HCT: 33 % — ABNORMAL LOW (ref 34.8–46.6)
HGB: 10.9 g/dL — ABNORMAL LOW (ref 11.6–15.9)
LYMPH%: 21.5 % (ref 14.0–49.7)
MCH: 29.2 pg (ref 25.1–34.0)
MCHC: 33.1 g/dL (ref 31.5–36.0)
MCV: 88.2 fL (ref 79.5–101.0)
MONO#: 0.4 10*3/uL (ref 0.1–0.9)
MONO%: 6.6 % (ref 0.0–14.0)
NEUT#: 4.5 10*3/uL (ref 1.5–6.5)
NEUT%: 69.1 % (ref 38.4–76.8)
Platelets: 260 10*3/uL (ref 145–400)
RBC: 3.75 10*6/uL (ref 3.70–5.45)
RDW: 17.1 % — ABNORMAL HIGH (ref 11.2–14.5)
WBC: 6.5 10*3/uL (ref 3.9–10.3)
lymph#: 1.4 10*3/uL (ref 0.9–3.3)

## 2015-12-17 LAB — COMPREHENSIVE METABOLIC PANEL
ALT: <9 U/L (ref 0–55)
AST: 13 U/L (ref 5–34)
Albumin: 3.9 g/dL (ref 3.5–5.0)
Alkaline Phosphatase: 103 U/L (ref 40–150)
Anion Gap: 9 mEq/L (ref 3–11)
BUN: 7.1 mg/dL (ref 7.0–26.0)
CO2: 29 mEq/L (ref 22–29)
Calcium: 9.1 mg/dL (ref 8.4–10.4)
Chloride: 105 mEq/L (ref 98–109)
Creatinine: 0.8 mg/dL (ref 0.6–1.1)
EGFR: >90 mL/min/{1.73_m2} (ref >90)
Glucose: 113 mg/dl (ref 70–140)
Potassium: 3.2 mEq/L — ABNORMAL LOW (ref 3.5–5.1)
Sodium: 143 mEq/L (ref 136–145)
Total Bilirubin: 0.31 mg/dL (ref 0.20–1.20)
Total Protein: 6.9 g/dL (ref 6.4–8.3)

## 2015-12-17 NOTE — Progress Notes (Signed)
[place]  [phone] [phone]   ID: [name] DOB: 03-08-1961  MR#: [id number]  YWV#:[id number]  Patient Care Team: Laurey Morale, MD as PCP - [place] III, MD as Consulting Physician (General Surgery) Chauncey Cruel, MD as Consulting Physician (Oncology) Thea Silversmith, MD as Consulting Physician (Radiation Oncology) Sylvan Cheese, NP as Nurse Practitioner (Hematology and Oncology) PCP: Laurey Morale, MD GYN: OTHER MD: Mardi Mainland MD, Nicholaus Bloom MD  CHIEF COMPLAINT: Multicentric breast cancer  CURRENT TREATMENT: Adjuvant chemotherapy pending  BREAST CANCER HISTORY: From the original intake note:  [name] herself noted a change in her right breast in October and brought it to the attention of her primary care physician. Her last mammogram had been April 2010. Dr. Sarajane Jews set [name] up for bilateral diagnostic mammography with tomosynthesis and [facility] 04[facility]11[facility]2015. This found the breast density to be category B. In the right breast there were 3 microlobulated masses in the lower inner quadrant measuring 1.9, 1.5, and 1.5 cm. These were multicentric. There were also diffuse fine pleomorphic calcifications surrounding these masses, that area spanning 11.6 cm. The masses were palpable by exam at 4:00 8 cm from the nipple and at 5:00 3 cm from the nipple. By ultrasonography there was an irregular hypoechoic mass in the right breast at 4:00 measuring 1.7 cm, 1 medial to this measuring 1.1 cm, and then the third irregular hypoechoic mass at the 5:00 position measuring 1.3 cm. The right axilla showed multiple lymph nodes with thickened cortices. The largest lymph node measured 0.9 cm.  On 08/02/2015, [name] underwent biopsy of all 3 breast masses as well as a right axillary lymph node. 2 of the tumors were similar invasive ductal carcinomas, grade 2, estrogen receptor 95%  positive, progesterone receptor 5% positive, with an MIB-1 of 10%, and HER-2 equivocal, with a signals ratio of 1.30 and the number per cell 4.0. The third tumor appeared's grade 1 or 2 was also estrogen receptor positive at 95%, progesterone receptor positive at 5%, with an MIP-1 of 5%, and a similar HER-2 profile the signals ratio being 1.46 but the number per cell being only 3.95. By immunohistochemistry on this tumor HER-2 was negative at 1+. Immunohistochemistry on the equivocal reading is pending.  [name]'s subsequent history is as detailed below  INTERVAL HISTORY: [name] returns today for follow-up of her breast cancer, accompanied by her husband. Today is day 1, cycle 4 cyclophosphamide and doxorubicin, with neulasta onpro for granulocyte support.  REVIEW OF SYSTEMS: Bone pain was again a struggle this week. She continues on methadone TID and is ambulating wit her cane. She feels like she can tough it out though. Otherwise, she denies fevers, chills, nausea, vomiting, or changes in bowel or bladder habits. She is eating well with no mouth sores. Her energy level is decent. She had noticed headaches during treatment, particularly after her premeds are administered. They do no last beyond the day of treatment, but are certainly uncomfortable. A detailed review of systems is otherwise stable.  PAST MEDICAL HISTORY: Past Medical History  Diagnosis Date  . Osteoarthritis   . Headache(784.0)     migraine like per patient  . Hypertension   . Low back pain     dr phillips, pain management  . Degenerative joint disease   . Gallstones   . GERD (gastroesophageal reflux disease)   . DDD (degenerative disc disease), cervical   . Colon polyps   .  Diverticulosis   . Diabetes mellitus without complication ([place])   . Breast cancer of lower-inner quadrant of right female breast ([place]) 08/08/2015  . Breast cancer ([place])     PAST SURGICAL HISTORY: Past Surgical History  Procedure Laterality Date  .  Replacement total knee Right     right - dr Novella Olive  . Ganglion cyst excision Right     right wrist  . Lipoma excision Right 02/21/08    right hip, [facility]  . Replacement total knee Left 06/25/09    left - dr Marciano Sequin [facility]  . Tonsillectomy    . Joint replacement      both knees have been done, four times each per patient. Her body rejects the implant, no infection.  . Abdominal hysterectomy  06/22/06    lap, dr Mancel Bale  . Other surgical history      lipoma removed from back 2012   . Cholecystectomy  10/28/2011    Procedure: LAPAROSCOPIC CHOLECYSTECTOMY WITH INTRAOPERATIVE CHOLANGIOGRAM;  Surgeon: Earnstine Regal, MD;  Location: [facility];  Service: General;  Laterality: N/A;  . Mass excision  08/24/2012    Procedure: EXCISION MASS;  Surgeon: Earnstine Regal, MD;  Location: [facility];  Service: General;  Laterality: Left;  excisie soft tissue masses left shoulder(back) & left upper arm  . Colonoscopy  02-03-13    per Dr. Henrene Pastor, diverticuli but no polyps, repeat in 5 yrs   . Mass excision Left 01/24/2014    Procedure: EXCISION SOFT TISSUE MASSES LOWER LEFT BACK;  Surgeon: Earnstine Regal, MD;  Location: [facility];  Service: General;  Laterality: Left;  . Esophagogastroduodenoscopy  02-03-13    per Dr. Henrene Pastor, normal   . Cardiac catheterization      back in 200 by Dr. Stanford Breed  . Tonsillectomy    . Mastectomy w/ sentinel node biopsy Right 10/02/2015  . Mastectomy w/ sentinel node biopsy Right 10/01/2015    Procedure: RIGHT MASTECTOMY WITH SENTINEL LYMPH NODE BIOPSY;  Surgeon: Autumn Messing III, MD;  Location: [facility];  Service: General;  Laterality: Right;  . Portacath placement Left 11/01/2015    Procedure: INSERTION PORT-A-CATH;  Surgeon: Autumn Messing III, MD;  Location: [facility];  Service: General;  Laterality: Left;    FAMILY HISTORY Family History  Problem Relation Age of Onset  . Crohn's disease Other     nephew  . Stomach cancer Mother   . Heart disease  Father   . Prostate cancer Father   . Kidney cancer Sister     one out of the four  . Hypertension Sister   . Hypertension Brother   . Colon cancer Maternal Grandmother   The patient's father died at the age of 19 with congestive heart failure. Her mother is still living as of November 2016, age 22. The patient had 6 brothers, 4 sisters. One sister was diagnosed with kidney cancer at age 47. ([place]). She is doing well. The patient's maternal grandmother was diagnosed with colon cancer at the age of 48. The patient's mother was diagnosed with a "rare stomach cancer" 20 years ago. The patient's father was diagnosed with prostate cancer at age 89. There is no history of breast or ovarian cancer in the family to her knowledge.   GYNECOLOGIC HISTORY:  No LMP recorded. Patient has had a hysterectomy.  menarche age 63, first live birth age 66, the patient is GX P3. She underwent remote hysterectomy with bilateral salpingo-oophorectomy. She did not  use hormone replacement. She never used oral contraceptives.   SOCIAL HISTORY:  [name] used to work as a "[employer] [place] but she is now disabled because of her multiple arthritic and degenerative problems. Her husband [name] works as a Freight forwarder. Son [name] works in long care in [place]. Son [name] works for a Arboriculturist in [place]. Daughter  [name] is a Haematologist in [place]. The patient has 5 grandchildren. She attends a Physicist, medical church    ADVANCED DIRECTIVES: Not in place   HEALTH MAINTENANCE: Social History  Substance Use Topics  . Smoking status: Current Every Day Smoker -- 0.25 packs/day for 20 years    Types: Cigarettes  . Smokeless tobacco: Former Systems developer    Quit date: 09/30/2015     Comment: quit  . Alcohol Use: No     Colonoscopy:February 2016   ZSW:FUXNAT post hysterectomy   Bone density:  Lipid panel:  Allergies  Allergen Reactions  . Codeine Itching  and Nausea And Vomiting    Itching all over the body  . Latex Hives  . Sulfonamide Derivatives Hives    All over the body    Current Outpatient Prescriptions  Medication Sig Dispense Refill  . amitriptyline (ELAVIL) 25 MG tablet Take 25 mg by mouth at bedtime.     Marland Kitchen amLODipine (NORVASC) 5 MG tablet Take 1 tablet (5 mg total) by mouth 2 (two) times daily. 180 tablet 3  . furosemide (LASIX) 20 MG tablet TAKE 1 TABLET BY MOUTH EVERY DAY 30 tablet 6  . lidocaine-prilocaine (EMLA) cream Apply 1 application topically as needed. 30 g 0  . lisinopril (PRINIVIL,ZESTRIL) 10 MG tablet Take 1 tablet (10 mg total) by mouth daily. 90 tablet 3  . metFORMIN (GLUCOPHAGE) 500 MG tablet Take 1 tablet (500 mg total) by mouth 2 (two) times daily with a meal. 180 tablet 3  . methadone (DOLOPHINE) 10 MG tablet Take 10 mg by mouth every 8 (eight) hours as needed for moderate pain.     . metoprolol (LOPRESSOR) 50 MG tablet Take 100 mg by mouth 2 (two) times daily.    Marland Kitchen omeprazole (PRILOSEC) 40 MG capsule Take 1 capsule (40 mg total) by mouth daily. 90 capsule 3  . ondansetron (ZOFRAN) 8 MG tablet Take 1 tablet (8 mg total) by mouth 2 (two) times daily as needed. Start on the third day after chemotherapy. 30 tablet 1  . oxyCODONE (ROXICODONE) 15 MG immediate release tablet Reported on 12/10/2015    . potassium chloride (K-DUR) 10 MEQ tablet Take 3 tablets (30 mEq total) by mouth 2 (two) times daily. 180 tablet 11  . temazepam (RESTORIL) 30 MG capsule TAKE ONE CAPSULE BY MOUTH AT BEDTIME AS NEEDED FOR SLEEP 90 capsule 1  . prochlorperazine (COMPAZINE) 10 MG tablet Take 1 tablet (10 mg total) by mouth every 6 (six) hours as needed (Nausea or vomiting). 30 tablet 1   No current facility-administered medications for this visit.    OBJECTIVE: middle-aged African-American woman in no acute distress Filed Vitals:   12/17/15 1048  BP: 143/78  Pulse: 73  Temp: 98.1 F (36.7 C)  Resp: 16     Body mass index is 39.99  kg/(m^2).    ECOG FS:2 - Symptomatic, <50% confined to bed  Sclerae unicteric, pupils round and equal Oropharynx clear and moist-- no thrush or other lesions No cervical or supraclavicular adenopathy Lungs no rales or rhonchi Heart regular rate and rhythm Abd soft, nontender, positive  bowel sounds MSK no focal spinal tenderness, no upper extremity lymphedema Neuro: nonfocal, well oriented, appropriate affect Breasts: deferred  LAB RESULTS:  CMP     Component Value Date/Time   NA 143 12/10/2015 1307   NA 143 10/30/2015 1535   K 3.1* 12/10/2015 1307   K 3.4* 10/30/2015 1535   CL 101 10/30/2015 1535   CO2 29 12/10/2015 1307   CO2 27 10/30/2015 1535   GLUCOSE 100 12/10/2015 1307   GLUCOSE 84 10/30/2015 1535   BUN 13.7 12/10/2015 1307   BUN 8 10/30/2015 1535   CREATININE 0.9 12/10/2015 1307   CREATININE 0.79 10/30/2015 1535   CALCIUM 9.2 12/10/2015 1307   CALCIUM 9.7 10/30/2015 1535   PROT 7.2 12/10/2015 1307   PROT 7.7 06/05/2015 1837   ALBUMIN 4.1 12/10/2015 1307   ALBUMIN 4.8 06/05/2015 1837   AST 17 12/10/2015 1307   AST 24 06/05/2015 1837   ALT 15 12/10/2015 1307   ALT 14 06/05/2015 1837   ALKPHOS 73 12/10/2015 1307   ALKPHOS 69 06/05/2015 1837   BILITOT <0.30 12/10/2015 1307   BILITOT 0.5 06/05/2015 1837   GFRNONAA >60 10/30/2015 1535   GFRAA >60 10/30/2015 1535    INo results found for: SPEP, UPEP  Lab Results  Component Value Date   WBC 6.5 12/17/2015   NEUTROABS 4.5 12/17/2015   HGB 10.9* 12/17/2015   HCT 33.0* 12/17/2015   MCV 88.2 12/17/2015   PLT 260 12/17/2015      Chemistry      Component Value Date/Time   NA 143 12/10/2015 1307   NA 143 10/30/2015 1535   K 3.1* 12/10/2015 1307   K 3.4* 10/30/2015 1535   CL 101 10/30/2015 1535   CO2 29 12/10/2015 1307   CO2 27 10/30/2015 1535   BUN 13.7 12/10/2015 1307   BUN 8 10/30/2015 1535   CREATININE 0.9 12/10/2015 1307   CREATININE 0.79 10/30/2015 1535      Component Value Date/Time   CALCIUM  9.2 12/10/2015 1307   CALCIUM 9.7 10/30/2015 1535   ALKPHOS 73 12/10/2015 1307   ALKPHOS 69 06/05/2015 1837   AST 17 12/10/2015 1307   AST 24 06/05/2015 1837   ALT 15 12/10/2015 1307   ALT 14 06/05/2015 1837   BILITOT <0.30 12/10/2015 1307   BILITOT 0.5 06/05/2015 1837       No results found for: LABCA2  No components found for: LABCA125  No results for input(s): INR in the last 168 hours.  Urinalysis    Component Value Date/Time   COLORURINE YELLOW 06/05/2015 2353   APPEARANCEUR CLOUDY* 06/05/2015 2353   LABSPEC 1.007 06/05/2015 2353   PHURINE 6.5 06/05/2015 2353   GLUCOSEU NEGATIVE 06/05/2015 2353   HGBUR TRACE* 06/05/2015 2353   HGBUR large 07/01/2010 0945   BILIRUBINUR NEGATIVE 06/05/2015 2353   BILIRUBINUR n 02/26/2015 1121   KETONESUR NEGATIVE 06/05/2015 2353   PROTEINUR NEGATIVE 06/05/2015 2353   PROTEINUR n 02/26/2015 1121   UROBILINOGEN 0.2 06/05/2015 2353   UROBILINOGEN 0.2 02/26/2015 1121   NITRITE NEGATIVE 06/05/2015 2353   NITRITE n 02/26/2015 1121   LEUKOCYTESUR NEGATIVE 06/05/2015 2353   STUDIES: No results found.  ASSESSMENT: 55 y.o. [place] woman status post right breast biopsy 3 and axillary lymph node biopsy 08/02/2015 for a clinical mpT1c N0, s invasive ductal carcinoma, grade 1 or 2, estrogen receptor 95% positive, progesterone receptor 5% positive, with an MIB-1 between 5 and 10%, and HER-2 equivocal on 1 of the 2 biopsies (signals  ratio 1.30, number per cell 4.0).tage IA   (1) status post right modified radical mastectomy 10/01/2015 for an mpT2 pN0, stage IIA invasive ductal carcinoma, grade 2, with a total of 20 benign lymph nodes removed  (2) Oncotype DX score of 32 predicts an outside the breast recurrence risk of 22% within 10 years if the patient's only systemic therapy is tamoxifen for 5 years. It also predicts significant benefit from adjuvant chemotherapy.  (3) patient to receive adjuvant doxorubicin and cyclophosphamide 4, be  followed as tolerated by paclitaxel weekly 12  (a) consider PREVENT study will her  (4) postmastectomy radiation to follow as appropriate  (5) anti-estrogens to follow completion of local treatment  (a) consider PALLAS  Study  (b) consider BWEL study  PLAN: The labs were reviewed in detail and were stable. She has continued treatment related anemia, but this is not presenting any issues so far.   We discussed her headaches with treatment, and this is likely due to the aloxi. She has not had any nausea with treatment, so we may discontinue this drug for her final treatment if she would like. We could also premed her with tylenol if we chose to keep the aloxi on board. She will make a decision about this next week.   Arnice will return in 1 week for cycle 4 of treatment. She understands and agrees with this plan. She knows the goal of treatment in her case is cure. She has been encouraged to call with any issues that might arise before her next visit here.   Laurie Panda, NP   12/17/2015 11:19 AM

## 2015-12-24 ENCOUNTER — Ambulatory Visit (HOSPITAL_BASED_OUTPATIENT_CLINIC_OR_DEPARTMENT_OTHER): Payer: 59 | Admitting: Nurse Practitioner

## 2015-12-24 ENCOUNTER — Encounter: Payer: Self-pay | Admitting: Nurse Practitioner

## 2015-12-24 ENCOUNTER — Ambulatory Visit (HOSPITAL_BASED_OUTPATIENT_CLINIC_OR_DEPARTMENT_OTHER): Payer: 59

## 2015-12-24 ENCOUNTER — Encounter: Payer: Self-pay | Admitting: *Deleted

## 2015-12-24 ENCOUNTER — Other Ambulatory Visit (HOSPITAL_BASED_OUTPATIENT_CLINIC_OR_DEPARTMENT_OTHER): Payer: 59

## 2015-12-24 VITALS — BP 147/74 | HR 74 | Temp 97.9°F | Resp 18 | Ht 67.0 in | Wt 256.3 lb

## 2015-12-24 DIAGNOSIS — Z170 Estrogen receptor positive status [ER+]: Secondary | ICD-10-CM

## 2015-12-24 DIAGNOSIS — E876 Hypokalemia: Secondary | ICD-10-CM | POA: Diagnosis not present

## 2015-12-24 DIAGNOSIS — Z5111 Encounter for antineoplastic chemotherapy: Secondary | ICD-10-CM

## 2015-12-24 DIAGNOSIS — G4441 Drug-induced headache, not elsewhere classified, intractable: Secondary | ICD-10-CM | POA: Diagnosis not present

## 2015-12-24 DIAGNOSIS — C50311 Malignant neoplasm of lower-inner quadrant of right female breast: Secondary | ICD-10-CM

## 2015-12-24 DIAGNOSIS — G4440 Drug-induced headache, not elsewhere classified, not intractable: Secondary | ICD-10-CM

## 2015-12-24 LAB — COMPREHENSIVE METABOLIC PANEL
ALT: 12 U/L (ref 0–55)
AST: 16 U/L (ref 5–34)
Albumin: 4 g/dL (ref 3.5–5.0)
Alkaline Phosphatase: 90 U/L (ref 40–150)
Anion Gap: 12 mEq/L — ABNORMAL HIGH (ref 3–11)
BUN: 10.8 mg/dL (ref 7.0–26.0)
CO2: 26 mEq/L (ref 22–29)
Calcium: 9.6 mg/dL (ref 8.4–10.4)
Chloride: 106 mEq/L (ref 98–109)
Creatinine: 0.9 mg/dL (ref 0.6–1.1)
EGFR: 83 mL/min/{1.73_m2} — ABNORMAL LOW (ref >90)
Glucose: 118 mg/dl (ref 70–140)
Potassium: 3.2 mEq/L — ABNORMAL LOW (ref 3.5–5.1)
Sodium: 143 mEq/L (ref 136–145)
Total Bilirubin: <0.3 mg/dL (ref 0.20–1.20)
Total Protein: 7.1 g/dL (ref 6.4–8.3)

## 2015-12-24 LAB — CBC WITH DIFFERENTIAL/PLATELET
BASO%: 0.1 % (ref 0.0–2.0)
Basophils Absolute: 0 10*3/uL (ref 0.0–0.1)
EOS%: 2 % (ref 0.0–7.0)
Eosinophils Absolute: 0.2 10*3/uL (ref 0.0–0.5)
HCT: 35.5 % (ref 34.8–46.6)
HGB: 11.6 g/dL (ref 11.6–15.9)
LYMPH%: 23.3 % (ref 14.0–49.7)
MCH: 28.7 pg (ref 25.1–34.0)
MCHC: 32.7 g/dL (ref 31.5–36.0)
MCV: 87.9 fL (ref 79.5–101.0)
MONO#: 0.6 10*3/uL (ref 0.1–0.9)
MONO%: 6.7 % (ref 0.0–14.0)
NEUT#: 6 10*3/uL (ref 1.5–6.5)
NEUT%: 67.9 % (ref 38.4–76.8)
Platelets: 283 10*3/uL (ref 145–400)
RBC: 4.03 10*6/uL (ref 3.70–5.45)
RDW: 18.3 % — ABNORMAL HIGH (ref 11.2–14.5)
WBC: 8.9 10*3/uL (ref 3.9–10.3)
lymph#: 2.1 10*3/uL (ref 0.9–3.3)

## 2015-12-24 MED ORDER — SODIUM CHLORIDE 0.9 % IV SOLN
Freq: Once | INTRAVENOUS | Status: AC
  Administered 2015-12-24: 13:00:00 via INTRAVENOUS

## 2015-12-24 MED ORDER — SODIUM CHLORIDE 0.9 % IV SOLN
600.0000 mg/m2 | Freq: Once | INTRAVENOUS | Status: AC
  Administered 2015-12-24: 1400 mg via INTRAVENOUS
  Filled 2015-12-24: qty 70

## 2015-12-24 MED ORDER — SODIUM CHLORIDE 0.9% FLUSH
10.0000 mL | INTRAVENOUS | Status: DC | PRN
  Administered 2015-12-24: 10 mL
  Filled 2015-12-24: qty 10

## 2015-12-24 MED ORDER — DOXORUBICIN HCL CHEMO IV INJECTION 2 MG/ML
60.0000 mg/m2 | Freq: Once | INTRAVENOUS | Status: AC
  Administered 2015-12-24: 140 mg via INTRAVENOUS
  Filled 2015-12-24: qty 70

## 2015-12-24 MED ORDER — HEPARIN SOD (PORK) LOCK FLUSH 100 UNIT/ML IV SOLN
500.0000 [IU] | Freq: Once | INTRAVENOUS | Status: AC | PRN
  Administered 2015-12-24: 500 [IU]
  Filled 2015-12-24: qty 5

## 2015-12-24 MED ORDER — FOSAPREPITANT DIMEGLUMINE INJECTION 150 MG
Freq: Once | INTRAVENOUS | Status: AC
  Administered 2015-12-24: 13:00:00 via INTRAVENOUS
  Filled 2015-12-24: qty 5

## 2015-12-24 MED ORDER — PEGFILGRASTIM 6 MG/0.6ML ~~LOC~~ PSKT
6.0000 mg | PREFILLED_SYRINGE | Freq: Once | SUBCUTANEOUS | Status: AC
  Administered 2015-12-24: 6 mg via SUBCUTANEOUS
  Filled 2015-12-24: qty 0.6

## 2015-12-24 MED ORDER — DEXAMETHASONE 4 MG PO TABS: 4.0000 mg | ORAL_TABLET | Freq: Two times a day (BID) | ORAL | Status: DC

## 2015-12-24 NOTE — Patient Instructions (Signed)
[facility] Discharge Instructions for Patients Receiving Chemotherapy  Today you received the following chemotherapy agents :  Adriamycin and Cytoxan.  To help prevent nausea and vomiting after your treatment, we encourage you to take your nausea medication: Compazine 10 mg every 6 hours as needed; Zofran 8 mg every 12 hours as needed.   If you develop nausea and vomiting that is not controlled by your nausea medication, call the clinic.   BELOW ARE SYMPTOMS THAT SHOULD BE REPORTED IMMEDIATELY:  *FEVER GREATER THAN 100.5 F  *CHILLS WITH OR WITHOUT FEVER  NAUSEA AND VOMITING THAT IS NOT CONTROLLED WITH YOUR NAUSEA MEDICATION  *UNUSUAL SHORTNESS OF BREATH  *UNUSUAL BRUISING OR BLEEDING  TENDERNESS IN MOUTH AND THROAT WITH OR WITHOUT PRESENCE OF ULCERS  *URINARY PROBLEMS  *BOWEL PROBLEMS  UNUSUAL RASH Items with * indicate a potential emergency and should be followed up as soon as possible.  Feel free to call the clinic you have any questions or concerns. The clinic phone number is [phone].  Please show the [facility] at check-in to the Emergency Department and triage nurse.

## 2015-12-24 NOTE — Progress Notes (Signed)
[name]  [phone] [phone]   ID: [name] DOB: 1960/07/24  MR#: [id number]  JSE#:[id number]  Patient Care Team: Laurey Morale, MD as PCP - Sugden III, MD as Consulting Physician (General Surgery) Chauncey Cruel, MD as Consulting Physician (Oncology) Thea Silversmith, MD as Consulting Physician (Radiation Oncology) Sylvan Cheese, NP as Nurse Practitioner (Hematology and Oncology) PCP: Laurey Morale, MD GYN: OTHER MD: Mardi Mainland MD, Nicholaus Bloom MD  CHIEF COMPLAINT: Multicentric breast cancer  CURRENT TREATMENT: Adjuvant chemotherapy pending  BREAST CANCER HISTORY: From the original intake note:  [name] herself noted a change in her right breast in October and brought it to the attention of her primary care physician. Her last mammogram had been April 2010. Dr. Sarajane Jews set [name] up for bilateral diagnostic mammography with tomosynthesis and [facility] 04/11/2015. This found the breast density to be category B. In the right breast there were 3 microlobulated masses in the lower inner quadrant measuring 1.9, 1.5, and 1.5 cm. These were multicentric. There were also diffuse fine pleomorphic calcifications surrounding these masses, that area spanning 11.6 cm. The masses were palpable by exam at 4:00 8 cm from the nipple and at 5:00 3 cm from the nipple. By ultrasonography there was an irregular hypoechoic mass in the right breast at 4:00 measuring 1.7 cm, 1 medial to this measuring 1.1 cm, and then the third irregular hypoechoic mass at the 5:00 position measuring 1.3 cm. The right axilla showed multiple lymph nodes with thickened cortices. The largest lymph node measured 0.9 cm.  On 08/02/2015, [name] underwent biopsy of all 3 breast masses as well as a right axillary lymph node. 2 of the tumors were similar invasive ductal carcinomas, grade 2, estrogen receptor 95%  positive, progesterone receptor 5% positive, with an MIB-1 of 10%, and HER-2 equivocal, with a signals ratio of 1.30 and the number per cell 4.0. The third tumor appeared's grade 1 or 2 was also estrogen receptor positive at 95%, progesterone receptor positive at 5%, with an MIP-1 of 5%, and a similar HER-2 profile the signals ratio being 1.46 but the number per cell being only 3.95. By immunohistochemistry on this tumor HER-2 was negative at 1+. Immunohistochemistry on the equivocal reading is pending.  [name]'s subsequent history is as detailed below  INTERVAL HISTORY: [name] returns today for follow-up of her breast cancer, accompanied by her husband. Today is day 1, cycle 4 cyclophosphamide and doxorubicin, with neulasta onpro for granulocyte support.  REVIEW OF SYSTEMS: Shaylin is stable today. She denies fevers or chills. Her nausea has resolved. She is moving her bowels well. She denies mouth sores or rashes. Her appetite is fair secondary to taste changes. She has joint pain that is made worse by neulasta. She continues on methadone TID and is ambulating wit her cane. She is concerned about the headaches that she experiences during treatment. A detailed review of systems is otherwise stable.  PAST MEDICAL HISTORY: Past Medical History  Diagnosis Date  . Osteoarthritis   . Headache(784.0)     migraine like per patient  . Hypertension   . Low back pain     dr phillips, pain management  . Degenerative joint disease   . Gallstones   . GERD (gastroesophageal reflux disease)   . DDD (degenerative disc disease), cervical   . Colon polyps   . Diverticulosis   . Diabetes mellitus without complication ([place])   . Breast  cancer of lower-inner quadrant of right female breast ([place]) 08/08/2015  . Breast cancer ([place])     PAST SURGICAL HISTORY: Past Surgical History  Procedure Laterality Date  . Replacement total knee Right     right - dr Novella Olive  . Ganglion cyst excision Right     right  wrist  . Lipoma excision Right 02/21/08    right hip, [facility]  . Replacement total knee Left 06/25/09    left - dr Marciano Sequin [facility]  . Tonsillectomy    . Joint replacement      both knees have been done, four times each per patient. Her body rejects the implant, no infection.  . Abdominal hysterectomy  06/22/06    lap, dr Mancel Bale  . Other surgical history      lipoma removed from back 2012   . Cholecystectomy  10/28/2011    Procedure: LAPAROSCOPIC CHOLECYSTECTOMY WITH INTRAOPERATIVE CHOLANGIOGRAM;  Surgeon: Earnstine Regal, MD;  Location: [facility];  Service: General;  Laterality: N/A;  . Mass excision  08/24/2012    Procedure: EXCISION MASS;  Surgeon: Earnstine Regal, MD;  Location: [facility];  Service: General;  Laterality: Left;  excisie soft tissue masses left shoulder(back) & left upper arm  . Colonoscopy  02-03-13    per Dr. Henrene Pastor, diverticuli but no polyps, repeat in 5 yrs   . Mass excision Left 01/24/2014    Procedure: EXCISION SOFT TISSUE MASSES LOWER LEFT BACK;  Surgeon: Earnstine Regal, MD;  Location: [facility];  Service: General;  Laterality: Left;  . Esophagogastroduodenoscopy  02-03-13    per Dr. Henrene Pastor, normal   . Cardiac catheterization      back in 200 by Dr. Stanford Breed  . Tonsillectomy    . Mastectomy w/ sentinel node biopsy Right 10/02/2015  . Mastectomy w/ sentinel node biopsy Right 10/01/2015    Procedure: RIGHT MASTECTOMY WITH SENTINEL LYMPH NODE BIOPSY;  Surgeon: Autumn Messing III, MD;  Location: [facility];  Service: General;  Laterality: Right;  . Portacath placement Left 11/01/2015    Procedure: INSERTION PORT-A-CATH;  Surgeon: Autumn Messing III, MD;  Location: [facility];  Service: General;  Laterality: Left;    FAMILY HISTORY Family History  Problem Relation Age of Onset  . Crohn's disease Other     nephew  . Stomach cancer Mother   . Heart disease Father   . Prostate cancer Father   . Kidney cancer Sister     one out of the four  .  Hypertension Sister   . Hypertension Brother   . Colon cancer Maternal Grandmother   The patient's father died at the age of 63 with congestive heart failure. Her mother is still living as of November 2016, age 83. The patient had 6 brothers, 4 sisters. One sister was diagnosed with kidney cancer at age 60. ([place]). She is doing well. The patient's maternal grandmother was diagnosed with colon cancer at the age of 31. The patient's mother was diagnosed with a "rare stomach cancer" 20 years ago. The patient's father was diagnosed with prostate cancer at age 30. There is no history of breast or ovarian cancer in the family to her knowledge.   GYNECOLOGIC HISTORY:  No LMP recorded. Patient has had a hysterectomy.  menarche age 65, first live birth age 64, the patient is GX P3. She underwent remote hysterectomy with bilateral salpingo-oophorectomy. She did not use hormone replacement. She never used oral contraceptives.   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 Her husband Elberta Fortis A. Vickroy works as a Freight forwarder. Son Dalphine Handing works in long care in [place]. Son Evette Doffing works for a Arboriculturist in [place]. Daughter  Silva Bandy is a Haematologist in [place]. The patient has 5 grandchildren. She attends a Physicist, medical church    ADVANCED DIRECTIVES: Not in place   HEALTH MAINTENANCE: Social History  Substance Use Topics  . Smoking status: Current Every Day Smoker -- 0.25 packs/day for 20 years    Types: Cigarettes  . Smokeless tobacco: Former Systems developer    Quit date: 09/30/2015     Comment: quit  . Alcohol Use: No     Colonoscopy:February 2016   JQB:HALPFX post hysterectomy   Bone density:  Lipid panel:  Allergies  Allergen Reactions  . Codeine Itching and Nausea And Vomiting    Itching all over the body  . Latex Hives  . Sulfonamide  Derivatives Hives    All over the body    Current Outpatient Prescriptions  Medication Sig Dispense Refill  . amitriptyline (ELAVIL) 25 MG tablet Take 25 mg by mouth at bedtime.     Marland Kitchen amLODipine (NORVASC) 5 MG tablet Take 1 tablet (5 mg total) by mouth 2 (two) times daily. 180 tablet 3  . furosemide (LASIX) 20 MG tablet TAKE 1 TABLET BY MOUTH EVERY DAY 30 tablet 6  . lidocaine-prilocaine (EMLA) cream Apply 1 application topically as needed. 30 g 0  . lisinopril (PRINIVIL,ZESTRIL) 10 MG tablet Take 1 tablet (10 mg total) by mouth daily. 90 tablet 3  . metFORMIN (GLUCOPHAGE) 500 MG tablet Take 1 tablet (500 mg total) by mouth 2 (two) times daily with a meal. 180 tablet 3  . methadone (DOLOPHINE) 10 MG tablet Take 10 mg by mouth every 8 (eight) hours as needed for moderate pain.     . metoprolol (LOPRESSOR) 50 MG tablet Take 100 mg by mouth 2 (two) times daily.    Marland Kitchen omeprazole (PRILOSEC) 40 MG capsule Take 1 capsule (40 mg total) by mouth daily. 90 capsule 3  . ondansetron (ZOFRAN) 8 MG tablet Take 1 tablet (8 mg total) by mouth 2 (two) times daily as needed. Start on the third day after chemotherapy. 30 tablet 1  . oxyCODONE (ROXICODONE) 15 MG immediate release tablet Reported on 12/10/2015    . potassium chloride (K-DUR) 10 MEQ tablet Take 3 tablets (30 mEq total) by mouth 2 (two) times daily. 180 tablet 11  . prochlorperazine (COMPAZINE) 10 MG tablet Take 1 tablet (10 mg total) by mouth every 6 (six) hours as needed (Nausea or vomiting). 30 tablet 1  . temazepam (RESTORIL) 30 MG capsule TAKE ONE CAPSULE BY MOUTH AT BEDTIME AS NEEDED FOR SLEEP 90 capsule 1  . dexamethasone (DECADRON) 4 MG tablet Take 1 tablet (4 mg total) by mouth 2 (two) times daily with a meal. 8 tablet 0   No current facility-administered medications for this visit.    OBJECTIVE: middle-aged African-American woman in no acute distress Filed Vitals:   12/24/15 1129  BP: 147/74  Pulse: 74  Temp: 97.9 F (36.6 C)  Resp:  18     Body mass index is 40.13 kg/(m^2).    ECOG FS:2 - Symptomatic, <50% confined to bed  Skin: warm, dry  HEENT: sclerae anicteric, conjunctivae pink, oropharynx clear. No thrush or mucositis.  Lymph Nodes: No cervical or supraclavicular  lymphadenopathy  Lungs: clear to auscultation bilaterally, no rales, wheezes, or rhonci  Heart: regular rate and rhythm  Abdomen: round, soft, non tender, positive bowel sounds  Musculoskeletal: No focal spinal tenderness, no peripheral edema  Neuro: non focal, well oriented, positive affect  Breasts: deferred  LAB RESULTS:  CMP     Component Value Date/Time   NA 143 12/17/2015 1034   NA 143 10/30/2015 1535   K 3.2* 12/17/2015 1034   K 3.4* 10/30/2015 1535   CL 101 10/30/2015 1535   CO2 29 12/17/2015 1034   CO2 27 10/30/2015 1535   GLUCOSE 113 12/17/2015 1034   GLUCOSE 84 10/30/2015 1535   BUN 7.1 12/17/2015 1034   BUN 8 10/30/2015 1535   CREATININE 0.8 12/17/2015 1034   CREATININE 0.79 10/30/2015 1535   CALCIUM 9.1 12/17/2015 1034   CALCIUM 9.7 10/30/2015 1535   PROT 6.9 12/17/2015 1034   PROT 7.7 06/05/2015 1837   ALBUMIN 3.9 12/17/2015 1034   ALBUMIN 4.8 06/05/2015 1837   AST 13 12/17/2015 1034   AST 24 06/05/2015 1837   ALT <9 12/17/2015 1034   ALT 14 06/05/2015 1837   ALKPHOS 103 12/17/2015 1034   ALKPHOS 69 06/05/2015 1837   BILITOT 0.31 12/17/2015 1034   BILITOT 0.5 06/05/2015 1837   GFRNONAA >60 10/30/2015 1535   GFRAA >60 10/30/2015 1535    INo results found for: SPEP, UPEP  Lab Results  Component Value Date   WBC 8.9 12/24/2015   NEUTROABS 6.0 12/24/2015   HGB 11.6 12/24/2015   HCT 35.5 12/24/2015   MCV 87.9 12/24/2015   PLT 283 12/24/2015      Chemistry      Component Value Date/Time   NA 143 12/17/2015 1034   NA 143 10/30/2015 1535   K 3.2* 12/17/2015 1034   K 3.4* 10/30/2015 1535   CL 101 10/30/2015 1535   CO2 29 12/17/2015 1034   CO2 27 10/30/2015 1535   BUN 7.1 12/17/2015 1034   BUN 8  10/30/2015 1535   CREATININE 0.8 12/17/2015 1034   CREATININE 0.79 10/30/2015 1535      Component Value Date/Time   CALCIUM 9.1 12/17/2015 1034   CALCIUM 9.7 10/30/2015 1535   ALKPHOS 103 12/17/2015 1034   ALKPHOS 69 06/05/2015 1837   AST 13 12/17/2015 1034   AST 24 06/05/2015 1837   ALT <9 12/17/2015 1034   ALT 14 06/05/2015 1837   BILITOT 0.31 12/17/2015 1034   BILITOT 0.5 06/05/2015 1837       No results found for: LABCA2  No components found for: LABCA125  No results for input(s): INR in the last 168 hours.  Urinalysis    Component Value Date/Time   COLORURINE YELLOW 06/05/2015 2353   APPEARANCEUR CLOUDY* 06/05/2015 2353   LABSPEC 1.007 06/05/2015 2353   PHURINE 6.5 06/05/2015 2353   GLUCOSEU NEGATIVE 06/05/2015 2353   HGBUR TRACE* 06/05/2015 2353   HGBUR large 07/01/2010 0945   BILIRUBINUR NEGATIVE 06/05/2015 2353   BILIRUBINUR n 02/26/2015 1121   KETONESUR NEGATIVE 06/05/2015 2353   PROTEINUR NEGATIVE 06/05/2015 2353   PROTEINUR n 02/26/2015 1121   UROBILINOGEN 0.2 06/05/2015 2353   UROBILINOGEN 0.2 02/26/2015 1121   NITRITE NEGATIVE 06/05/2015 2353   NITRITE n 02/26/2015 1121   LEUKOCYTESUR NEGATIVE 06/05/2015 2353   STUDIES: No results found.  ASSESSMENT: 55 y.o. [place] woman status post right breast biopsy 3 and axillary lymph node biopsy 08/02/2015 for a clinical mpT1c N0, s invasive ductal carcinoma, grade  1 or 2, estrogen receptor 95% positive, progesterone receptor 5% positive, with an MIB-1 between 5 and 10%, and HER-2 equivocal on 1 of the 2 biopsies (signals ratio 1.30, number per cell 4.0).tage IA   (1) status post right modified radical mastectomy 10/01/2015 for an mpT2 pN0, stage IIA invasive ductal carcinoma, grade 2, with a total of 20 benign lymph nodes removed  (2) Oncotype DX score of 32 predicts an outside the breast recurrence risk of 22% within 10 years if the patient's only systemic therapy is tamoxifen for 5 years. It also  predicts significant benefit from adjuvant chemotherapy.  (3) patient to receive adjuvant doxorubicin and cyclophosphamide 4, be followed as tolerated by paclitaxel weekly 12  (a) consider PREVENT study will her  (4) postmastectomy radiation to follow as appropriate  (5) anti-estrogens to follow completion of local treatment  (a) consider PALLAS  Study  (b) consider BWEL study  PLAN: The labs were reviewed in detail and were sufficient for treatment. Her potassium is still low at 3.2. She is going to increase her supplement from 10 meq BID to 20 meq BID. She is also on lasix daily and has had to be on supplementation before.   She will proceed with her 4th and final cycle of doxorubicin and cyclophosphamide as planned today. We are removing aloxi from her premed list, because it is causing headaches. She will also stop the zofran at home. We had previously decided against home use of dexamethasone because of her history of diabetes but this is well controlled by metformin. She will take 9m dexamethasone BID for 3 days this week only.   BNichawill return in 1 week for labs and a nadir visit with Dr. MJana Hakim During this visit they will discuss her next regimen, paclitaxel weekly x12. She understands and agrees with this plan. She knows the goal of treatment in her case is cure. She has been encouraged to call with any issues that might arise before her next visit here.   HLaurie Panda NP   12/24/2015 11:56 AM

## 2015-12-25 ENCOUNTER — Other Ambulatory Visit: Payer: Self-pay | Admitting: Nurse Practitioner

## 2015-12-25 ENCOUNTER — Ambulatory Visit (HOSPITAL_BASED_OUTPATIENT_CLINIC_OR_DEPARTMENT_OTHER): Payer: 59

## 2015-12-25 DIAGNOSIS — Z5189 Encounter for other specified aftercare: Secondary | ICD-10-CM

## 2015-12-25 DIAGNOSIS — C50311 Malignant neoplasm of lower-inner quadrant of right female breast: Secondary | ICD-10-CM

## 2015-12-25 MED ORDER — PEGFILGRASTIM INJECTION 6 MG/0.6ML ~~LOC~~
6.0000 mg | PREFILLED_SYRINGE | Freq: Once | SUBCUTANEOUS | Status: AC
  Administered 2015-12-25: 6 mg via SUBCUTANEOUS
  Filled 2015-12-25: qty 0.6

## 2015-12-25 NOTE — Patient Instructions (Signed)
Pegfilgrastim injection What is this medicine? PEGFILGRASTIM (PEG fil gra stim) is a long-acting granulocyte colony-stimulating factor that stimulates the growth of neutrophils, a type of white blood cell important in the body's fight against infection. It is used to reduce the incidence of fever and infection in patients with certain types of cancer who are receiving chemotherapy that affects the bone marrow, and to increase survival after being exposed to high doses of radiation. This medicine may be used for other purposes; ask your health care provider or pharmacist if you have questions. What should I tell my health care provider before I take this medicine? They need to know if you have any of these conditions: -kidney disease -latex allergy -ongoing radiation therapy -sickle cell disease -skin reactions to acrylic adhesives (On-Body Injector only) -an unusual or allergic reaction to pegfilgrastim, filgrastim, other medicines, foods, dyes, or preservatives -pregnant or trying to get pregnant -breast-feeding How should I use this medicine? This medicine is for injection under the skin. If you get this medicine at home, you will be taught how to prepare and give the pre-filled syringe or how to use the On-body Injector. Refer to the patient Instructions for Use for detailed instructions. Use exactly as directed. Take your medicine at regular intervals. Do not take your medicine more often than directed. It is important that you put your used needles and syringes in a special sharps container. Do not put them in a trash can. If you do not have a sharps container, call your pharmacist or healthcare provider to get one. Talk to your pediatrician regarding the use of this medicine in children. While this drug may be prescribed for selected conditions, precautions do apply. Overdosage: If you think you have taken too much of this medicine contact a poison control center or emergency room at  once. NOTE: This medicine is only for you. Do not share this medicine with others. What if I miss a dose? It is important not to miss your dose. Call your doctor or health care professional if you miss your dose. If you miss a dose due to an On-body Injector failure or leakage, a new dose should be administered as soon as possible using a single prefilled syringe for manual use. What may interact with this medicine? Interactions have not been studied. Give your health care provider a list of all the medicines, herbs, non-prescription drugs, or dietary supplements you use. Also tell them if you smoke, drink alcohol, or use illegal drugs. Some items may interact with your medicine. This list may not describe all possible interactions. Give your health care provider a list of all the medicines, herbs, non-prescription drugs, or dietary supplements you use. Also tell them if you smoke, drink alcohol, or use illegal drugs. Some items may interact with your medicine. What should I watch for while using this medicine? You may need blood work done while you are taking this medicine. If you are going to need a MRI, CT scan, or other procedure, tell your doctor that you are using this medicine (On-Body Injector only). What side effects may I notice from receiving this medicine? Side effects that you should report to your doctor or health care professional as soon as possible: -allergic reactions like skin rash, itching or hives, swelling of the face, lips, or tongue -dizziness -fever -pain, redness, or irritation at site where injected -pinpoint red spots on the skin -red or dark-brown urine -shortness of breath or breathing problems -stomach or side pain, or pain   at the shoulder -swelling -tiredness -trouble passing urine or change in the amount of urine Side effects that usually do not require medical attention (report to your doctor or health care professional if they continue or are  bothersome): -bone pain -muscle pain This list may not describe all possible side effects. Call your doctor for medical advice about side effects. You may report side effects to FDA at 1-800-FDA-1088. Where should I keep my medicine? Keep out of the reach of children. Store pre-filled syringes in a refrigerator between 2 and 8 degrees C (36 and 46 degrees F). Do not freeze. Keep in carton to protect from light. Throw away this medicine if it is left out of the refrigerator for more than 48 hours. Throw away any unused medicine after the expiration date. NOTE: This sheet is a summary. It may not cover all possible information. If you have questions about this medicine, talk to your doctor, pharmacist, or health care provider.    2016, Elsevier/Gold Standard. (2014-09-28 14:30:14)  

## 2015-12-25 NOTE — Progress Notes (Signed)
Patient received Neulasta Onpro post treatment yesterday (12/24/15).  States that device fell off arm.  Pharmacy is aware.  Patient received Neulasta injection today, Heather, NP is aware.

## 2015-12-31 ENCOUNTER — Other Ambulatory Visit (HOSPITAL_BASED_OUTPATIENT_CLINIC_OR_DEPARTMENT_OTHER): Payer: 59

## 2015-12-31 ENCOUNTER — Ambulatory Visit (HOSPITAL_BASED_OUTPATIENT_CLINIC_OR_DEPARTMENT_OTHER): Payer: 59 | Admitting: Oncology

## 2015-12-31 VITALS — BP 157/65 | HR 73 | Temp 97.8°F | Resp 18 | Ht 67.0 in | Wt 253.9 lb

## 2015-12-31 DIAGNOSIS — C50311 Malignant neoplasm of lower-inner quadrant of right female breast: Secondary | ICD-10-CM

## 2015-12-31 DIAGNOSIS — Z170 Estrogen receptor positive status [ER+]: Secondary | ICD-10-CM | POA: Diagnosis not present

## 2015-12-31 DIAGNOSIS — R61 Generalized hyperhidrosis: Secondary | ICD-10-CM | POA: Diagnosis not present

## 2015-12-31 LAB — COMPREHENSIVE METABOLIC PANEL
ALT: <9 U/L (ref 0–55)
AST: 10 U/L (ref 5–34)
Albumin: 3.9 g/dL (ref 3.5–5.0)
Alkaline Phosphatase: 112 U/L (ref 40–150)
Anion Gap: 11 mEq/L (ref 3–11)
BUN: 15.7 mg/dL (ref 7.0–26.0)
CO2: 28 mEq/L (ref 22–29)
Calcium: 9.3 mg/dL (ref 8.4–10.4)
Chloride: 104 mEq/L (ref 98–109)
Creatinine: 0.8 mg/dL (ref 0.6–1.1)
EGFR: >90 mL/min/{1.73_m2} (ref >90)
Glucose: 141 mg/dl — ABNORMAL HIGH (ref 70–140)
Potassium: 3.2 mEq/L — ABNORMAL LOW (ref 3.5–5.1)
Sodium: 143 mEq/L (ref 136–145)
Total Bilirubin: <0.3 mg/dL (ref 0.20–1.20)
Total Protein: 6.7 g/dL (ref 6.4–8.3)

## 2015-12-31 LAB — CBC WITH DIFFERENTIAL/PLATELET
BASO%: 0.5 % (ref 0.0–2.0)
Basophils Absolute: 0 10*3/uL (ref 0.0–0.1)
EOS%: 2 % (ref 0.0–7.0)
Eosinophils Absolute: 0.1 10*3/uL (ref 0.0–0.5)
HCT: 34.6 % — ABNORMAL LOW (ref 34.8–46.6)
HGB: 11.3 g/dL — ABNORMAL LOW (ref 11.6–15.9)
LYMPH%: 23.7 % (ref 14.0–49.7)
MCH: 29.1 pg (ref 25.1–34.0)
MCHC: 32.8 g/dL (ref 31.5–36.0)
MCV: 88.7 fL (ref 79.5–101.0)
MONO#: 0.3 10*3/uL (ref 0.1–0.9)
MONO%: 5.6 % (ref 0.0–14.0)
NEUT#: 4 10*3/uL (ref 1.5–6.5)
NEUT%: 68.2 % (ref 38.4–76.8)
Platelets: 174 10*3/uL (ref 145–400)
RBC: 3.9 10*6/uL (ref 3.70–5.45)
RDW: 17.6 % — ABNORMAL HIGH (ref 11.2–14.5)
WBC: 5.9 10*3/uL (ref 3.9–10.3)
lymph#: 1.4 10*3/uL (ref 0.9–3.3)

## 2015-12-31 MED ORDER — GABAPENTIN 300 MG PO CAPS: 300.0000 mg | ORAL_CAPSULE | Freq: Every day | ORAL | Status: DC

## 2015-12-31 NOTE — Progress Notes (Signed)
[place]  [phone]) [phone] Fax:(336) [phone]   ID: IVIONNA VERLEY DOB: May 01, 1961  MR#: [id number]  VEH#:[id number]  Patient Care Team: Laurey Morale, MD as PCP - Wolverine III, MD as Consulting Physician (General Surgery) Chauncey Cruel, MD as Consulting Physician (Oncology) Thea Silversmith, MD as Consulting Physician (Radiation Oncology) Sylvan Cheese, NP as Nurse Practitioner (Hematology and Oncology) PCP: Laurey Morale, MD GYN: OTHER MD: Mardi Mainland MD, Nicholaus Bloom MD  CHIEF COMPLAINT: Multicentric breast cancer  CURRENT TREATMENT: Adjuvant chemotherapy ongoing  BREAST CANCER HISTORY: From the original intake note:  Keyaria herself noted a change in her right breast in October and brought it to the attention of her primary care physician. Her last mammogram had been April 2010. Dr. Sarajane Jews set [name] up for bilateral diagnostic mammography with tomosynthesis and [facility] 04/11/2015. This found the breast density to be category B. In the right breast there were 3 microlobulated masses in the lower inner quadrant measuring 1.9, 1.5, and 1.5 cm. These were multicentric. There were also diffuse fine pleomorphic calcifications surrounding these masses, that area spanning 11.6 cm. The masses were palpable by exam at 4:00 8 cm from the nipple and at 5:00 3 cm from the nipple. By ultrasonography there was an irregular hypoechoic mass in the right breast at 4:00 measuring 1.7 cm, 1 medial to this measuring 1.1 cm, and then the third irregular hypoechoic mass at the 5:00 position measuring 1.3 cm. The right axilla showed multiple lymph nodes with thickened cortices. The largest lymph node measured 0.9 cm.  On 08/02/2015, [name] underwent biopsy of all 3 breast masses as well as a right axillary lymph node. 2 of the tumors were similar invasive ductal carcinomas, grade 2, estrogen receptor 95%  positive, progesterone receptor 5% positive, with an MIB-1 of 10%, and HER-2 equivocal, with a signals ratio of 1.30 and the number per cell 4.0. The third tumor appeared's grade 1 or 2 was also estrogen receptor positive at 95%, progesterone receptor positive at 5%, with an MIP-1 of 5%, and a similar HER-2 profile the signals ratio being 1.46 but the number per cell being only 3.95. By immunohistochemistry on this tumor HER-2 was negative at 1+. Immunohistochemistry on the equivocal reading is pending.  Latesha's subsequent history is as detailed below  INTERVAL HISTORY: Aliea returns today for follow-up of her estrogen receptor positive breast cancer, accompanied by her one of her sons. Today is day 8, cycle 4 of 4 planned cycles of cyclophosphamide and doxorubicin, to be followed by weekly paclitaxel 12   REVIEW OF SYSTEMS: Medina did remarkably 12 with her initial chemotherapy. She did have more nausea although no vomiting this last dose, and she thinks it is because perhaps the dose was given more rapidly than the earlier ones. She wants to make sure that I put a note in that her chemotherapy needs to be a little bit slower and I am glad to do that. She lost a little bit of weight during these treatments as well, but she is now eating normally and she never allowed herself to get dehydrated. She does have significant problems with daytime hot flashes at night time sweats, which wake her up several times a night. She also developed significant pain in her left hip. She tells me this was evaluated at [facility] but I cannot find any films in [facility]. In any case she is on methadone through Dr. Hardin Negus  office and that is working well for her. She does not have any problems with constipation. Of course she continues to have severe back and joint pain and is walking with a cane as usual. She tells me her sugars have been excellent and she really does not believe she has diabetes. She does take her  metformin. A detailed review of systems today was otherwise stable  PAST MEDICAL HISTORY: Past Medical History  Diagnosis Date  . Osteoarthritis   . Headache(784.0)     migraine like per patient  . Hypertension   . Low back pain     dr phillips, pain management  . Degenerative joint disease   . Gallstones   . GERD (gastroesophageal reflux disease)   . DDD (degenerative disc disease), cervical   . Colon polyps   . Diverticulosis   . Diabetes mellitus without complication ([place])   . Breast cancer of lower-inner quadrant of right female breast ([place]) 08/08/2015  . Breast cancer ([place])     PAST SURGICAL HISTORY: Past Surgical History  Procedure Laterality Date  . Replacement total knee Right     right - dr Novella Olive  . Ganglion cyst excision Right     right wrist  . Lipoma excision Right 02/21/08    right hip, [facility]  . Replacement total knee Left 06/25/09    left - dr Marciano Sequin [facility]  . Tonsillectomy    . Joint replacement      both knees have been done, four times each per patient. Her body rejects the implant, no infection.  . Abdominal hysterectomy  06/22/06    lap, dr Mancel Bale  . Other surgical history      lipoma removed from back 2012   . Cholecystectomy  10/28/2011    Procedure: LAPAROSCOPIC CHOLECYSTECTOMY WITH INTRAOPERATIVE CHOLANGIOGRAM;  Surgeon: Earnstine Regal, MD;  Location: [facility];  Service: General;  Laterality: N/A;  . Mass excision  08/24/2012    Procedure: EXCISION MASS;  Surgeon: Earnstine Regal, MD;  Location: [facility];  Service: General;  Laterality: Left;  excisie soft tissue masses left shoulder(back) & left upper arm  . Colonoscopy  02-03-13    per Dr. Henrene Pastor, diverticuli but no polyps, repeat in 5 yrs   . Mass excision Left 01/24/2014    Procedure: EXCISION SOFT TISSUE MASSES LOWER LEFT BACK;  Surgeon: Earnstine Regal, MD;  Location: [facility];  Service: General;  Laterality: Left;  . Esophagogastroduodenoscopy  02-03-13    per Dr.  Henrene Pastor, normal   . Cardiac catheterization      back in 200 by Dr. Stanford Breed  . Tonsillectomy    . Mastectomy w/ sentinel node biopsy Right 10/02/2015  . Mastectomy w/ sentinel node biopsy Right 10/01/2015    Procedure: RIGHT MASTECTOMY WITH SENTINEL LYMPH NODE BIOPSY;  Surgeon: Autumn Messing III, MD;  Location: [place] [facility];  Service: General;  Laterality: Right;  . Portacath placement Left 11/01/2015    Procedure: INSERTION PORT-A-CATH;  Surgeon: Autumn Messing III, MD;  Location: [facility];  Service: General;  Laterality: Left;    FAMILY HISTORY Family History  Problem Relation Age of Onset  . Crohn's disease Other     nephew  . Stomach cancer Mother   . Heart disease Father   . Prostate cancer Father   . Kidney cancer Sister     one out of the four  . Hypertension Sister   . Hypertension Brother   . Colon cancer Maternal  Grandmother   The patient's father died at the age of 59 with congestive heart failure. Her mother is still living as of November 2016, age 48. The patient had 6 brothers, 4 sisters. One sister was diagnosed with kidney cancer at age 85. ([place]). She is doing well. The patient's maternal grandmother was diagnosed with colon cancer at the age of 11. The patient's mother was diagnosed with a "rare stomach cancer" 20 years ago. The patient's father was diagnosed with prostate cancer at age 47. There is no history of breast or ovarian cancer in the family to her knowledge.   GYNECOLOGIC HISTORY:  No LMP recorded. Patient has had a hysterectomy.  menarche age 61, first live birth age 39, the patient is GX P3. She underwent remote hysterectomy with bilateral salpingo-oophorectomy. She did not use hormone replacement. She never used oral contraceptives.   SOCIAL HISTORY:  Quanika used to work as a "[employer] but she is now disabled because of her multiple arthritic and degenerative problems. Her husband Elberta Fortis A. Lahmann works as a Freight forwarder. Son Dalphine Handing works in long care in [place]. Son Evette Doffing works for a Arboriculturist in [place]. Daughter  Silva Bandy is a Haematologist in [place]. The patient has 5 grandchildren. She attends a Physicist, medical church    ADVANCED DIRECTIVES: Not in place   HEALTH MAINTENANCE: Social History  Substance Use Topics  . Smoking status: Current Every Day Smoker -- 0.25 packs/day for 20 years    Types: Cigarettes  . Smokeless tobacco: Former Systems developer    Quit date: 09/30/2015     Comment: quit  . Alcohol Use: No     Colonoscopy:February 2016   FTD:DUKGUR post hysterectomy   Bone density:  Lipid panel:  Allergies  Allergen Reactions  . Codeine Itching and Nausea And Vomiting    Itching all over the body  . Latex Hives  . Sulfonamide Derivatives Hives    All over the body    Current Outpatient Prescriptions  Medication Sig Dispense Refill  . amitriptyline (ELAVIL) 25 MG tablet Take 25 mg by mouth at bedtime.     Marland Kitchen amLODipine (NORVASC) 5 MG tablet Take 1 tablet (5 mg total) by mouth 2 (two) times daily. 180 tablet 3  . dexamethasone (DECADRON) 4 MG tablet Take 1 tablet (4 mg total) by mouth 2 (two) times daily with a meal. 8 tablet 0  . furosemide (LASIX) 20 MG tablet TAKE 1 TABLET BY MOUTH EVERY DAY 30 tablet 6  . lidocaine-prilocaine (EMLA) cream Apply 1 application topically as needed. 30 g 0  . lisinopril (PRINIVIL,ZESTRIL) 10 MG tablet Take 1 tablet (10 mg total) by mouth daily. 90 tablet 3  . metFORMIN (GLUCOPHAGE) 500 MG tablet Take 1 tablet (500 mg total) by mouth 2 (two) times daily with a meal. 180 tablet 3  . methadone (DOLOPHINE) 10 MG tablet Take 10 mg by mouth every 8 (eight) hours as needed for moderate pain.     . metoprolol (LOPRESSOR) 50 MG tablet Take 100 mg by mouth 2 (two) times daily.    Marland Kitchen omeprazole (PRILOSEC) 40 MG capsule Take 1 capsule (40 mg total) by mouth daily. 90 capsule 3  . ondansetron (ZOFRAN) 8 MG tablet Take 1 tablet (8 mg total)  by mouth 2 (two) times daily as needed. Start on the third day after chemotherapy. 30 tablet 1  . oxyCODONE (ROXICODONE) 15 MG immediate release tablet Reported on 12/10/2015    . potassium  chloride (K-DUR) 10 MEQ tablet Take 3 tablets (30 mEq total) by mouth 2 (two) times daily. 180 tablet 11  . prochlorperazine (COMPAZINE) 10 MG tablet Take 1 tablet (10 mg total) by mouth every 6 (six) hours as needed (Nausea or vomiting). 30 tablet 1  . temazepam (RESTORIL) 30 MG capsule TAKE ONE CAPSULE BY MOUTH AT BEDTIME AS NEEDED FOR SLEEP 90 capsule 1   No current facility-administered medications for this visit.    OBJECTIVE: middle-aged African-American woman Who appears stated age  54 Vitals:   12/31/15 0905  BP: 157/65  Pulse: 73  Temp: 97.8 F (36.6 C)  Resp: 18     Body mass index is 39.76 kg/(m^2).    ECOG FS:2 - Symptomatic, <50% confined to bed  Sclerae unicteric, EOMs intact Oropharynx clear and moist No cervical or supraclavicular adenopathy Lungs no rales or rhonchi Heart regular rate and rhythm Abd soft, obese, nontender, positive bowel sounds MSK no focal spinal tenderness, no upper extremity lymphedema, straight leg raise on the right to 30 on the left of 5 secondary to hip pain Neuro: Some tingling in the right upper extremity, but otherwise nonfocal, well oriented, appropriate affect Breasts: Deferred   LAB RESULTS:  CMP     Component Value Date/Time   NA 143 12/24/2015 1109   NA 143 10/30/2015 1535   K 3.2* 12/24/2015 1109   K 3.4* 10/30/2015 1535   CL 101 10/30/2015 1535   CO2 26 12/24/2015 1109   CO2 27 10/30/2015 1535   GLUCOSE 118 12/24/2015 1109   GLUCOSE 84 10/30/2015 1535   BUN 10.8 12/24/2015 1109   BUN 8 10/30/2015 1535   CREATININE 0.9 12/24/2015 1109   CREATININE 0.79 10/30/2015 1535   CALCIUM 9.6 12/24/2015 1109   CALCIUM 9.7 10/30/2015 1535   PROT 7.1 12/24/2015 1109   PROT 7.7 06/05/2015 1837   ALBUMIN 4.0 12/24/2015 1109   ALBUMIN 4.8  06/05/2015 1837   AST 16 12/24/2015 1109   AST 24 06/05/2015 1837   ALT 12 12/24/2015 1109   ALT 14 06/05/2015 1837   ALKPHOS 90 12/24/2015 1109   ALKPHOS 69 06/05/2015 1837   BILITOT <0.30 12/24/2015 1109   BILITOT 0.5 06/05/2015 1837   GFRNONAA >60 10/30/2015 1535   GFRAA >60 10/30/2015 1535    INo results found for: SPEP, UPEP  Lab Results  Component Value Date   WBC 5.9 12/31/2015   NEUTROABS 4.0 12/31/2015   HGB 11.3* 12/31/2015   HCT 34.6* 12/31/2015   MCV 88.7 12/31/2015   PLT 174 12/31/2015      Chemistry      Component Value Date/Time   NA 143 12/24/2015 1109   NA 143 10/30/2015 1535   K 3.2* 12/24/2015 1109   K 3.4* 10/30/2015 1535   CL 101 10/30/2015 1535   CO2 26 12/24/2015 1109   CO2 27 10/30/2015 1535   BUN 10.8 12/24/2015 1109   BUN 8 10/30/2015 1535   CREATININE 0.9 12/24/2015 1109   CREATININE 0.79 10/30/2015 1535      Component Value Date/Time   CALCIUM 9.6 12/24/2015 1109   CALCIUM 9.7 10/30/2015 1535   ALKPHOS 90 12/24/2015 1109   ALKPHOS 69 06/05/2015 1837   AST 16 12/24/2015 1109   AST 24 06/05/2015 1837   ALT 12 12/24/2015 1109   ALT 14 06/05/2015 1837   BILITOT <0.30 12/24/2015 1109   BILITOT 0.5 06/05/2015 1837       No results found for: LABCA2  No  components found for: [id number]  No results for input(s): INR in the last 168 hours.  Urinalysis    Component Value Date/Time   COLORURINE YELLOW 06/05/2015 2353   APPEARANCEUR CLOUDY* 06/05/2015 2353   LABSPEC 1.007 06/05/2015 2353   PHURINE 6.5 06/05/2015 2353   GLUCOSEU NEGATIVE 06/05/2015 2353   HGBUR TRACE* 06/05/2015 2353   HGBUR large 07/01/2010 0945   BILIRUBINUR NEGATIVE 06/05/2015 2353   BILIRUBINUR n 02/26/2015 1121   KETONESUR NEGATIVE 06/05/2015 2353   PROTEINUR NEGATIVE 06/05/2015 2353   PROTEINUR n 02/26/2015 1121   UROBILINOGEN 0.2 06/05/2015 2353   UROBILINOGEN 0.2 02/26/2015 1121   NITRITE NEGATIVE 06/05/2015 2353   NITRITE n 02/26/2015 1121    LEUKOCYTESUR NEGATIVE 06/05/2015 2353   STUDIES: No results found.  ASSESSMENT: 55 y.o. [place] woman status post right breast biopsy 3 and axillary lymph node biopsy 08/02/2015 for a clinical mpT1c N0, s invasive ductal carcinoma, grade 1 or 2, estrogen receptor 95% positive, progesterone receptor 5% positive, with an MIB-1 between 5 and 10%, and HER-2 equivocal on 1 of the 2 biopsies (signals ratio 1.30, number per cell 4.0).tage IA   (1) status post right modified radical mastectomy 10/01/2015 for an mpT2 pN0, stage IIA invasive ductal carcinoma, grade 2, with a total of 20 benign lymph nodes removed  (2) Oncotype DX score of 32 predicts an outside the breast recurrence risk of 22% within 10 years if the patient's only systemic therapy is tamoxifen for 5 years. It also predicts significant benefit from adjuvant chemotherapy.  (3) patient completed adjuvant doxorubicin and cyclophosphamide in dose dense fashion 4, last dose 12/24/2015, to be followed as tolerated by paclitaxel weekly 12 Starting 01/07/2016   (4) postmastectomy radiation to follow   (5) anti-estrogens to follow completion of local treatment  (a) consider PALLAS  Study  (b) consider BWEL study  PLAN: Letoya did remarkably well with the Cytoxan/Adriamycin treatments. She is now ready to start the weekly paclitaxel. We reviewed the possible toxicities, side effects and complications of this agent. She understands this should be a lot easier on her, and she will not need a day to immune booster shot. She also will not need dexamethasone for nausea. She will use Compazine the night of treatment in the next morning and she may use Zofran as well if necessary.  We did discuss concerns regarding neuropathy. She at this point has no numbness or tingling, even though she carries a diagnosis of diabetes (she tells me she really does not think she has diabetes; I do not have the full records regarding her workup). At any rate if she  does develop neuropathy we will simply stop the paclitaxel.  Whenever she completes her chemotherapy she will proceed to radiation and after that of course to anti-estrogens.  As far as her nighttime hot sweats are concerned we are going to start gabapentin at bedtime. If that works well, then at some point probably in May we will start venlafaxine in the morning at 37.5 mg per day. That should significantly help with her hot flashes  She tells me the wound area on her right chest wall is irregular and makes the prosthesis uncomfortable. She is going to discuss this with Dr. Marlou Starks although she understands further surgery may or may not be possible.  She will call with any problems that may develop before her next visit here.  Chauncey Cruel, MD   12/31/2015 9:08 AM

## 2016-01-07 ENCOUNTER — Other Ambulatory Visit (HOSPITAL_BASED_OUTPATIENT_CLINIC_OR_DEPARTMENT_OTHER): Payer: 59

## 2016-01-07 ENCOUNTER — Ambulatory Visit (HOSPITAL_BASED_OUTPATIENT_CLINIC_OR_DEPARTMENT_OTHER): Payer: 59 | Admitting: Nurse Practitioner

## 2016-01-07 ENCOUNTER — Ambulatory Visit (HOSPITAL_BASED_OUTPATIENT_CLINIC_OR_DEPARTMENT_OTHER): Payer: 59

## 2016-01-07 ENCOUNTER — Encounter: Payer: Self-pay | Admitting: Nurse Practitioner

## 2016-01-07 ENCOUNTER — Encounter: Payer: Self-pay | Admitting: *Deleted

## 2016-01-07 ENCOUNTER — Telehealth: Payer: Self-pay | Admitting: Nurse Practitioner

## 2016-01-07 VITALS — BP 150/82 | HR 70 | Temp 98.7°F | Resp 19 | Ht 67.0 in | Wt 254.8 lb

## 2016-01-07 VITALS — BP 154/82 | HR 75 | Temp 98.9°F | Resp 18

## 2016-01-07 DIAGNOSIS — E876 Hypokalemia: Secondary | ICD-10-CM

## 2016-01-07 DIAGNOSIS — C50311 Malignant neoplasm of lower-inner quadrant of right female breast: Secondary | ICD-10-CM | POA: Diagnosis not present

## 2016-01-07 DIAGNOSIS — Z5111 Encounter for antineoplastic chemotherapy: Secondary | ICD-10-CM | POA: Diagnosis not present

## 2016-01-07 DIAGNOSIS — Z170 Estrogen receptor positive status [ER+]: Secondary | ICD-10-CM

## 2016-01-07 LAB — CBC WITH DIFFERENTIAL/PLATELET
BASO%: 0.2 % (ref 0.0–2.0)
Basophils Absolute: 0 10*3/uL (ref 0.0–0.1)
EOS%: 1 % (ref 0.0–7.0)
Eosinophils Absolute: 0.1 10*3/uL (ref 0.0–0.5)
HCT: 35 % (ref 34.8–46.6)
HGB: 11.3 g/dL — ABNORMAL LOW (ref 11.6–15.9)
LYMPH%: 25.6 % (ref 14.0–49.7)
MCH: 29.6 pg (ref 25.1–34.0)
MCHC: 32.3 g/dL (ref 31.5–36.0)
MCV: 91.6 fL (ref 79.5–101.0)
MONO#: 0.7 10*3/uL (ref 0.1–0.9)
MONO%: 8.1 % (ref 0.0–14.0)
NEUT#: 5.3 10*3/uL (ref 1.5–6.5)
NEUT%: 65.1 % (ref 38.4–76.8)
Platelets: 253 10*3/uL (ref 145–400)
RBC: 3.82 10*6/uL (ref 3.70–5.45)
RDW: 17.4 % — ABNORMAL HIGH (ref 11.2–14.5)
WBC: 8.2 10*3/uL (ref 3.9–10.3)
lymph#: 2.1 10*3/uL (ref 0.9–3.3)

## 2016-01-07 LAB — COMPREHENSIVE METABOLIC PANEL
ALT: 11 U/L (ref 0–55)
AST: 14 U/L (ref 5–34)
Albumin: 4 g/dL (ref 3.5–5.0)
Alkaline Phosphatase: 92 U/L (ref 40–150)
Anion Gap: 11 mEq/L (ref 3–11)
BUN: 8.4 mg/dL (ref 7.0–26.0)
CO2: 28 mEq/L (ref 22–29)
Calcium: 9.5 mg/dL (ref 8.4–10.4)
Chloride: 104 mEq/L (ref 98–109)
Creatinine: 0.9 mg/dL (ref 0.6–1.1)
EGFR: 85 mL/min/{1.73_m2} — ABNORMAL LOW (ref >90)
Glucose: 126 mg/dl (ref 70–140)
Potassium: 3 mEq/L — CL (ref 3.5–5.1)
Sodium: 143 mEq/L (ref 136–145)
Total Bilirubin: <0.3 mg/dL (ref 0.20–1.20)
Total Protein: 7 g/dL (ref 6.4–8.3)

## 2016-01-07 MED ORDER — FAMOTIDINE IN NACL 20-0.9 MG/50ML-% IV SOLN
INTRAVENOUS | Status: AC
  Filled 2016-01-07: qty 50

## 2016-01-07 MED ORDER — SODIUM CHLORIDE 0.9 % IV SOLN
80.0000 mg/m2 | Freq: Once | INTRAVENOUS | Status: AC
  Administered 2016-01-07: 186 mg via INTRAVENOUS
  Filled 2016-01-07: qty 31

## 2016-01-07 MED ORDER — SODIUM CHLORIDE 0.9% FLUSH
10.0000 mL | INTRAVENOUS | Status: DC | PRN
  Filled 2016-01-07: qty 10

## 2016-01-07 MED ORDER — DIPHENHYDRAMINE HCL 50 MG/ML IJ SOLN
25.0000 mg | Freq: Once | INTRAMUSCULAR | Status: AC
  Administered 2016-01-07: 25 mg via INTRAVENOUS

## 2016-01-07 MED ORDER — POTASSIUM CHLORIDE CRYS ER 20 MEQ PO TBCR
20.0000 meq | EXTENDED_RELEASE_TABLET | Freq: Once | ORAL | Status: DC
  Administered 2016-01-07: 20 meq via ORAL
  Filled 2016-01-07: qty 1

## 2016-01-07 MED ORDER — SODIUM CHLORIDE 0.9 % IV SOLN
Freq: Once | INTRAVENOUS | Status: AC
  Administered 2016-01-07: 12:00:00 via INTRAVENOUS

## 2016-01-07 MED ORDER — FAMOTIDINE IN NACL 20-0.9 MG/50ML-% IV SOLN
20.0000 mg | Freq: Once | INTRAVENOUS | Status: AC
  Administered 2016-01-07: 20 mg via INTRAVENOUS

## 2016-01-07 MED ORDER — HEPARIN SOD (PORK) LOCK FLUSH 100 UNIT/ML IV SOLN
500.0000 [IU] | Freq: Once | INTRAVENOUS | Status: DC | PRN
  Filled 2016-01-07: qty 5

## 2016-01-07 MED ORDER — SODIUM CHLORIDE 0.9 % IV SOLN
4.0000 mg | Freq: Once | INTRAVENOUS | Status: AC
  Administered 2016-01-07: 4 mg via INTRAVENOUS
  Filled 2016-01-07: qty 0.4

## 2016-01-07 MED ORDER — DIPHENHYDRAMINE HCL 50 MG/ML IJ SOLN
INTRAMUSCULAR | Status: AC
  Filled 2016-01-07: qty 1

## 2016-01-07 NOTE — Telephone Encounter (Signed)
appt made and avs printed °

## 2016-01-07 NOTE — Progress Notes (Signed)
[place]  [phone] Fax:([phone]) [phone]   ID: [name] DOB: 14-Sep-1961  MR#: [id number]  YKZ#:[id number]  Patient Care Team: Diane Morale, MD as PCP - Diane III, MD as Consulting Physician (General Surgery) Chauncey Cruel, MD as Consulting Physician (Oncology) Thea Silversmith, MD as Consulting Physician (Radiation Oncology) Sylvan Cheese, NP as Nurse Practitioner (Hematology and Oncology) PCP: Diane Morale, MD GYN: OTHER MD: Diane Mainland MD, Diane Bloom MD  CHIEF COMPLAINT: Multicentric breast cancer  CURRENT TREATMENT: Adjuvant chemotherapy ongoing  BREAST CANCER HISTORY: From the original intake note:  [name] herself noted a change in her right breast in October and brought it to the attention of her primary care physician. Her last mammogram had been April 2010. Dr. Sarajane [name] set [name] up for bilateral diagnostic mammography with tomosynthesis and [facility] 04[facility]11[facility]2015. This found the breast density to be category B. In the right breast there were 3 microlobulated masses in the lower inner quadrant measuring 1.9, 1.5, and 1.5 cm. These were multicentric. There were also diffuse fine pleomorphic calcifications surrounding these masses, that area spanning 11.6 cm. The masses were palpable by exam at 4:00 8 cm from the nipple and at 5:00 3 cm from the nipple. By ultrasonography there was an irregular hypoechoic mass in the right breast at 4:00 measuring 1.7 cm, 1 medial to this measuring 1.1 cm, and then the third irregular hypoechoic mass at the 5:00 position measuring 1.3 cm. The right axilla showed multiple lymph nodes with thickened cortices. The largest lymph node measured 0.9 cm.  On 08/02/2015, [name] underwent biopsy of all 3 breast masses as well as a right axillary lymph node. 2 of the tumors were similar invasive ductal carcinomas, grade 2, estrogen receptor 95%  positive, progesterone receptor 5% positive, with an MIB-1 of 10%, and HER-2 equivocal, with a signals ratio of 1.30 and the number per cell 4.0. The third tumor appeared's grade 1 or 2 was also estrogen receptor positive at 95%, progesterone receptor positive at 5%, with an MIP-1 of 5%, and a similar HER-2 profile the signals ratio being 1.46 but the number per cell being only 3.95. By immunohistochemistry on this tumor HER-2 was negative at 1+. Immunohistochemistry on the equivocal reading is pending.  [name]'s subsequent history is as detailed below  INTERVAL HISTORY: [name] returns today for follow-up of her estrogen receptor positive breast cancer, accompanied by her husband. Today she is due for cycle 1 of 12 planned doses of paclitaxel.  REVIEW OF SYSTEMS: [name] denies fevers, chills, nausea, vomiting, or changes in bowel or bladder habits. She is eating well and staying well hydrated. She denies mouth sores, rashes, or neuropathy symptoms. She continues on methadone TID For hip pain and ambulates with a cane. She takes 356m gabapentin for hot flashes. This also helps her sleep. A detailed review of systems is otherwise stable.   PAST MEDICAL HISTORY: Past Medical History  Diagnosis Date  . Osteoarthritis   . Headache(784.0)     migraine like per patient  . Hypertension   . Low back pain     dr phillips, pain management  . Degenerative joint disease   . Gallstones   . GERD (gastroesophageal reflux disease)   . DDD (degenerative disc disease), cervical   . Colon polyps   . Diverticulosis   . Diabetes mellitus without complication ([place]   . Breast cancer of lower-inner quadrant of right female breast ([place]  08/08/2015  . Breast cancer ([place])     PAST SURGICAL HISTORY: Past Surgical History  Procedure Laterality Date  . Replacement total knee Right     right - dr Novella Olive  . Ganglion cyst excision Right     right wrist  . Lipoma excision Right 02/21/08    right hip, [facility]  .  Replacement total knee Left 06/25/09    left - dr Marciano Sequin [facility]  . Tonsillectomy    . Joint replacement      both knees have been done, four times each per patient. Her body rejects the implant, no infection.  . Abdominal hysterectomy  06/22/06    lap, dr Mancel Bale  . Other surgical history      lipoma removed from back 2012   . Cholecystectomy  10/28/2011    Procedure: LAPAROSCOPIC CHOLECYSTECTOMY WITH INTRAOPERATIVE CHOLANGIOGRAM;  Surgeon: Earnstine Regal, MD;  Location: [facility];  Service: General;  Laterality: N/A;  . Mass excision  08/24/2012    Procedure: EXCISION MASS;  Surgeon: Earnstine Regal, MD;  Location: [facility];  Service: General;  Laterality: Left;  excisie soft tissue masses left shoulder(back) & left upper arm  . Colonoscopy  02-03-13    per Dr. Henrene Pastor, diverticuli but no polyps, repeat in 5 yrs   . Mass excision Left 01/24/2014    Procedure: EXCISION SOFT TISSUE MASSES LOWER LEFT BACK;  Surgeon: Earnstine Regal, MD;  Location: [place];  Service: General;  Laterality: Left;  . Esophagogastroduodenoscopy  02-03-13    per Dr. Henrene Pastor, normal   . Cardiac catheterization      back in 200 by Dr. Stanford Breed  . Tonsillectomy    . Mastectomy w/ sentinel node biopsy Right 10/02/2015  . Mastectomy w/ sentinel node biopsy Right 10/01/2015    Procedure: RIGHT MASTECTOMY WITH SENTINEL LYMPH NODE BIOPSY;  Surgeon: Autumn Messing III, MD;  Location: [place];  Service: General;  Laterality: Right;  . Portacath placement Left 11/01/2015    Procedure: INSERTION PORT-A-CATH;  Surgeon: Autumn Messing III, MD;  Location: [place];  Service: General;  Laterality: Left;    FAMILY HISTORY Family History  Problem Relation Age of Onset  . Crohn's disease Other     nephew  . Stomach cancer Mother   . Heart disease Father   . Prostate cancer Father   . Kidney cancer Sister     one out of the four  . Hypertension Sister   . Hypertension Brother   . Colon cancer Maternal  Grandmother   The patient's father died at the age of 1 with congestive heart failure. Her mother is still living as of November 2016, age 5. The patient had 6 brothers, 4 sisters. One sister was diagnosed with kidney cancer at age 83. ([place]). She is doing well. The patient's maternal grandmother was diagnosed with colon cancer at the age of 92. The patient's mother was diagnosed with a "rare stomach cancer" 20 years ago. The patient's father was diagnosed with prostate cancer at age 47. There is no history of breast or ovarian cancer in the family to her knowledge.   GYNECOLOGIC HISTORY:  No LMP recorded. Patient has had a hysterectomy.  menarche age 89, first live birth age 38, the patient is GX P3. She underwent remote hysterectomy with bilateral salpingo-oophorectomy. She did not use hormone replacement. She never used oral contraceptives.   SOCIAL HISTORY:  Arleny used to work as a "[employer]  but she is now disabled because of her multiple arthritic and degenerative problems. Her husband Elberta Fortis A. Lezotte works as a Freight forwarder. Son Dalphine Handing works in long care in [place]. Son Evette Doffing works for a Arboriculturist in [place]. Daughter  Silva Bandy is a Haematologist in [place]. The patient has 5 grandchildren. She attends a Physicist, medical church    ADVANCED DIRECTIVES: Not in place   HEALTH MAINTENANCE: Social History  Substance Use Topics  . Smoking status: Current Every Day Smoker -- 0.25 packs/day for 20 years    Types: Cigarettes  . Smokeless tobacco: Former Systems developer    Quit date: 09/30/2015     Comment: quit  . Alcohol Use: No     Colonoscopy:February 2016   MPN:TIRWER post hysterectomy   Bone density:  Lipid panel:  Allergies  Allergen Reactions  . Codeine Itching and Nausea And Vomiting    Itching all over the body  . Latex Hives  . Sulfonamide Derivatives Hives    All over the body    Current Outpatient  Prescriptions  Medication Sig Dispense Refill  . amitriptyline (ELAVIL) 25 MG tablet Take 25 mg by mouth at bedtime.     Marland Kitchen amLODipine (NORVASC) 5 MG tablet Take 1 tablet (5 mg total) by mouth 2 (two) times daily. 180 tablet 3  . furosemide (LASIX) 20 MG tablet TAKE 1 TABLET BY MOUTH EVERY DAY 30 tablet 6  . gabapentin (NEURONTIN) 300 MG capsule Take 1 capsule (300 mg total) by mouth at bedtime. 90 capsule 4  . lidocaine-prilocaine (EMLA) cream Apply 1 application topically as needed. 30 g 0  . lisinopril (PRINIVIL,ZESTRIL) 10 MG tablet Take 1 tablet (10 mg total) by mouth daily. 90 tablet 3  . metFORMIN (GLUCOPHAGE) 500 MG tablet Take 1 tablet (500 mg total) by mouth 2 (two) times daily with a meal. 180 tablet 3  . methadone (DOLOPHINE) 10 MG tablet Take 10 mg by mouth every 8 (eight) hours as needed for moderate pain.     . metoprolol (LOPRESSOR) 50 MG tablet Take 100 mg by mouth 2 (two) times daily.    Marland Kitchen omeprazole (PRILOSEC) 40 MG capsule Take 1 capsule (40 mg total) by mouth daily. 90 capsule 3  . oxyCODONE (ROXICODONE) 15 MG immediate release tablet Reported on 12/10/2015    . potassium chloride (K-DUR) 10 MEQ tablet Take 3 tablets (30 mEq total) by mouth 2 (two) times daily. 180 tablet 11  . temazepam (RESTORIL) 30 MG capsule TAKE ONE CAPSULE BY MOUTH AT BEDTIME AS NEEDED FOR SLEEP 90 capsule 1   No current facility-administered medications for this visit.    OBJECTIVE: middle-aged African-American woman Who appears stated age  23 Vitals:   01/07/16 1041  BP: 150/82  Pulse: 70  Temp: 98.7 F (37.1 C)  Resp: 19     Body mass index is 39.9 kg/(m^2).    ECOG FS:2 - Symptomatic, <50% confined to bed   Skin: warm, dry  HEENT: sclerae anicteric, conjunctivae pink, oropharynx clear. No thrush or mucositis.  Lymph Nodes: No cervical or supraclavicular lymphadenopathy  Lungs: clear to auscultation bilaterally, no rales, wheezes, or rhonci  Heart: regular rate and rhythm  Abdomen:  round, soft, non tender, positive bowel sounds  Musculoskeletal: No focal spinal tenderness, no peripheral edema  Neuro: non focal, well oriented, positive affect  Breast: deferred   LAB RESULTS:  CMP     Component Value Date/Time   NA 143 01/07/2016 1027   NA 143  10/30/2015 1535   K 3.0* 01/07/2016 1027   K 3.4* 10/30/2015 1535   CL 101 10/30/2015 1535   CO2 28 01/07/2016 1027   CO2 27 10/30/2015 1535   GLUCOSE 126 01/07/2016 1027   GLUCOSE 84 10/30/2015 1535   BUN 8.4 01/07/2016 1027   BUN 8 10/30/2015 1535   CREATININE 0.9 01/07/2016 1027   CREATININE 0.79 10/30/2015 1535   CALCIUM 9.5 01/07/2016 1027   CALCIUM 9.7 10/30/2015 1535   PROT 7.0 01/07/2016 1027   PROT 7.7 06/05/2015 1837   ALBUMIN 4.0 01/07/2016 1027   ALBUMIN 4.8 06/05/2015 1837   AST 14 01/07/2016 1027   AST 24 06/05/2015 1837   ALT 11 01/07/2016 1027   ALT 14 06/05/2015 1837   ALKPHOS 92 01/07/2016 1027   ALKPHOS 69 06/05/2015 1837   BILITOT <0.30 01/07/2016 1027   BILITOT 0.5 06/05/2015 1837   GFRNONAA >60 10/30/2015 1535   GFRAA >60 10/30/2015 1535    INo results found for: SPEP, UPEP  Lab Results  Component Value Date   WBC 8.2 01/07/2016   NEUTROABS 5.3 01/07/2016   HGB 11.3* 01/07/2016   HCT 35.0 01/07/2016   MCV 91.6 01/07/2016   PLT 253 01/07/2016      Chemistry      Component Value Date/Time   NA 143 01/07/2016 1027   NA 143 10/30/2015 1535   K 3.0* 01/07/2016 1027   K 3.4* 10/30/2015 1535   CL 101 10/30/2015 1535   CO2 28 01/07/2016 1027   CO2 27 10/30/2015 1535   BUN 8.4 01/07/2016 1027   BUN 8 10/30/2015 1535   CREATININE 0.9 01/07/2016 1027   CREATININE 0.79 10/30/2015 1535      Component Value Date/Time   CALCIUM 9.5 01/07/2016 1027   CALCIUM 9.7 10/30/2015 1535   ALKPHOS 92 01/07/2016 1027   ALKPHOS 69 06/05/2015 1837   AST 14 01/07/2016 1027   AST 24 06/05/2015 1837   ALT 11 01/07/2016 1027   ALT 14 06/05/2015 1837   BILITOT <0.30 01/07/2016 1027    BILITOT 0.5 06/05/2015 1837       No results found for: LABCA2  No components found for: LABCA125  No results for input(s): INR in the last 168 hours.  Urinalysis    Component Value Date/Time   COLORURINE YELLOW 06/05/2015 2353   APPEARANCEUR CLOUDY* 06/05/2015 2353   LABSPEC 1.007 06/05/2015 2353   PHURINE 6.5 06/05/2015 2353   GLUCOSEU NEGATIVE 06/05/2015 2353   HGBUR TRACE* 06/05/2015 2353   HGBUR large 07/01/2010 0945   BILIRUBINUR NEGATIVE 06/05/2015 2353   BILIRUBINUR n 02/26/2015 1121   KETONESUR NEGATIVE 06/05/2015 2353   PROTEINUR NEGATIVE 06/05/2015 2353   PROTEINUR n 02/26/2015 1121   UROBILINOGEN 0.2 06/05/2015 2353   UROBILINOGEN 0.2 02/26/2015 1121   NITRITE NEGATIVE 06/05/2015 2353   NITRITE n 02/26/2015 1121   LEUKOCYTESUR NEGATIVE 06/05/2015 2353   STUDIES: No results found.  ASSESSMENT: 55 y.o. [place] woman status post right breast biopsy 3 and axillary lymph node biopsy 08/02/2015 for a clinical mpT1c N0, s invasive ductal carcinoma, grade 1 or 2, estrogen receptor 95% positive, progesterone receptor 5% positive, with an MIB-1 between 5 and 10%, and HER-2 equivocal on 1 of the 2 biopsies (signals ratio 1.30, number per cell 4.0).tage IA   (1) status post right modified radical mastectomy 10/01/2015 for an mpT2 pN0, stage IIA invasive ductal carcinoma, grade 2, with a total of 20 benign lymph nodes removed  (2) Oncotype DX  score of 32 predicts an outside the breast recurrence risk of 22% within 10 years if the patient's only systemic therapy is tamoxifen for 5 years. It also predicts significant benefit from adjuvant chemotherapy.  (3) patient completed adjuvant doxorubicin and cyclophosphamide in dose dense fashion 4, last dose 12/24/2015, to be followed as tolerated by paclitaxel weekly 12 Starting 01/07/2016   (4) postmastectomy radiation to follow   (5) anti-estrogens to follow completion of local treatment  (a) consider PALLAS  Study  (b)  consider BWEL study  PLAN: [name] is doing well today. She has no new complaints to offer. The labs were reviewed in detail. Her potassium is critical at 3.0. She will be given a 74mq supplement in the treatment room, and increase her oral supplement use to 217m BID at home. She will proceed with cycle 1 of paclitaxel as planned today.   [name] return in 1 week for labs and cycle 2 of treatment. She understands and agrees with this plan. She knows the goal of treatment in her case is cure. She has been encouraged to call with any issues that might arise before her next visit here.   HeLaurie PandaNP   01/07/2016 11:11 AM

## 2016-01-09 ENCOUNTER — Encounter (HOSPITAL_COMMUNITY): Payer: Self-pay | Admitting: Emergency Medicine

## 2016-01-09 ENCOUNTER — Other Ambulatory Visit: Payer: Self-pay

## 2016-01-09 ENCOUNTER — Emergency Department (HOSPITAL_COMMUNITY)
Admission: EM | Admit: 2016-01-09 | Discharge: 2016-01-09 | Disposition: A | Discharge status: Home / Self Care | Payer: 59 | Attending: Emergency Medicine | Admitting: Emergency Medicine

## 2016-01-09 DIAGNOSIS — N390 Urinary tract infection, site not specified: Secondary | ICD-10-CM | POA: Insufficient documentation

## 2016-01-09 DIAGNOSIS — K219 Gastro-esophageal reflux disease without esophagitis: Secondary | ICD-10-CM | POA: Insufficient documentation

## 2016-01-09 DIAGNOSIS — I10 Essential (primary) hypertension: Secondary | ICD-10-CM | POA: Insufficient documentation

## 2016-01-09 DIAGNOSIS — Z9049 Acquired absence of other specified parts of digestive tract: Secondary | ICD-10-CM | POA: Insufficient documentation

## 2016-01-09 DIAGNOSIS — Z9889 Other specified postprocedural states: Secondary | ICD-10-CM | POA: Diagnosis not present

## 2016-01-09 DIAGNOSIS — Z90710 Acquired absence of both cervix and uterus: Secondary | ICD-10-CM | POA: Diagnosis not present

## 2016-01-09 DIAGNOSIS — Z91040 Latex allergy status: Secondary | ICD-10-CM | POA: Diagnosis not present

## 2016-01-09 DIAGNOSIS — F17210 Nicotine dependence, cigarettes, uncomplicated: Secondary | ICD-10-CM | POA: Diagnosis not present

## 2016-01-09 DIAGNOSIS — Z86010 Personal history of colonic polyps: Secondary | ICD-10-CM | POA: Diagnosis not present

## 2016-01-09 DIAGNOSIS — Z853 Personal history of malignant neoplasm of breast: Secondary | ICD-10-CM | POA: Insufficient documentation

## 2016-01-09 DIAGNOSIS — R1031 Right lower quadrant pain: Secondary | ICD-10-CM | POA: Diagnosis present

## 2016-01-09 DIAGNOSIS — Z7984 Long term (current) use of oral hypoglycemic drugs: Secondary | ICD-10-CM | POA: Insufficient documentation

## 2016-01-09 DIAGNOSIS — E876 Hypokalemia: Secondary | ICD-10-CM | POA: Insufficient documentation

## 2016-01-09 DIAGNOSIS — E119 Type 2 diabetes mellitus without complications: Secondary | ICD-10-CM | POA: Diagnosis not present

## 2016-01-09 DIAGNOSIS — M1990 Unspecified osteoarthritis, unspecified site: Secondary | ICD-10-CM | POA: Diagnosis not present

## 2016-01-09 DIAGNOSIS — Z79899 Other long term (current) drug therapy: Secondary | ICD-10-CM | POA: Insufficient documentation

## 2016-01-09 LAB — BASIC METABOLIC PANEL
Anion gap: 12 (ref 5–15)
BUN: 15 mg/dL (ref 6–20)
CO2: 24 mmol/L (ref 22–32)
Calcium: 9.1 mg/dL (ref 8.9–10.3)
Chloride: 104 mmol/L (ref 101–111)
Creatinine, Ser: 0.74 mg/dL (ref 0.44–1.00)
GFR calc Af Amer: >60 mL/min (ref >60)
GFR calc non Af Amer: >60 mL/min (ref >60)
Glucose, Bld: 144 mg/dL — ABNORMAL HIGH (ref 65–99)
Potassium: 2.7 mmol/L — CL (ref 3.5–5.1)
Sodium: 140 mmol/L (ref 135–145)

## 2016-01-09 LAB — URINALYSIS, ROUTINE W REFLEX MICROSCOPIC
Bilirubin Urine: NEGATIVE
Glucose, UA: NEGATIVE mg/dL
Ketones, ur: NEGATIVE mg/dL
Nitrite: NEGATIVE
Protein, ur: NEGATIVE mg/dL
Specific Gravity, Urine: 1.02 (ref 1.005–1.030)
pH: 6 (ref 5.0–8.0)

## 2016-01-09 LAB — CBC WITH DIFFERENTIAL/PLATELET
Basophils Absolute: 0 10*3/uL (ref 0.0–0.1)
Basophils Relative: 0 %
Eosinophils Absolute: 0 10*3/uL (ref 0.0–0.7)
Eosinophils Relative: 0 %
HCT: 33 % — ABNORMAL LOW (ref 36.0–46.0)
Hemoglobin: 11 g/dL — ABNORMAL LOW (ref 12.0–15.0)
Lymphocytes Relative: 33 %
Lymphs Abs: 2.4 10*3/uL (ref 0.7–4.0)
MCH: 29.4 pg (ref 26.0–34.0)
MCHC: 33.3 g/dL (ref 30.0–36.0)
MCV: 88.2 fL (ref 78.0–100.0)
Monocytes Absolute: 0.3 10*3/uL (ref 0.1–1.0)
Monocytes Relative: 4 %
Neutro Abs: 4.5 10*3/uL (ref 1.7–7.7)
Neutrophils Relative %: 63 %
Platelets: 282 10*3/uL (ref 150–400)
RBC: 3.74 MIL/uL — ABNORMAL LOW (ref 3.87–5.11)
RDW: 17.1 % — ABNORMAL HIGH (ref 11.5–15.5)
WBC: 7.2 10*3/uL (ref 4.0–10.5)

## 2016-01-09 LAB — URINE MICROSCOPIC-ADD ON

## 2016-01-09 LAB — MAGNESIUM: Magnesium: 1.8 mg/dL (ref 1.7–2.4)

## 2016-01-09 MED ORDER — CEPHALEXIN 500 MG PO CAPS: 500.0000 mg | ORAL_CAPSULE | Freq: Four times a day (QID) | ORAL | Status: DC

## 2016-01-09 MED ORDER — POTASSIUM CHLORIDE CRYS ER 20 MEQ PO TBCR
20.0000 meq | EXTENDED_RELEASE_TABLET | Freq: Two times a day (BID) | ORAL | Status: DC
  Administered 2016-01-09: 20 meq via ORAL
  Filled 2016-01-09: qty 1

## 2016-01-09 MED ORDER — HEPARIN SOD (PORK) LOCK FLUSH 100 UNIT/ML IV SOLN
500.0000 [IU] | Freq: Once | INTRAVENOUS | Status: AC
  Administered 2016-01-09: 500 [IU]
  Filled 2016-01-09: qty 5

## 2016-01-09 MED ORDER — DEXTROSE 5 % IV SOLN
1.0000 g | Freq: Once | INTRAVENOUS | Status: AC
  Administered 2016-01-09: 1 g via INTRAVENOUS
  Filled 2016-01-09: qty 10

## 2016-01-09 MED ORDER — POTASSIUM CHLORIDE 10 MEQ/100ML IV SOLN
10.0000 meq | Freq: Once | INTRAVENOUS | Status: AC
  Administered 2016-01-09: 10 meq via INTRAVENOUS
  Filled 2016-01-09: qty 100

## 2016-01-09 NOTE — ED Notes (Signed)
Pt reports dysuria x 1 day and bilateral flank pain. Also reports lower abd pain . denies hematuria. Pt receive chem once a week for breat cancer.

## 2016-01-09 NOTE — ED Provider Notes (Signed)
CSN: [id number]     Arrival date & time 01/09/16  1758 History   First MD Initiated Contact with Patient 01/09/16 1826     Chief Complaint  Patient presents with  . Urinary Tract Infection      HPI  Expand All Collapse All   Pt reports dysuria x 1 day and bilateral flank pain. Also reports lower abd pain . denies hematuria. Pt receive chem once a week for breat cancer.Patient states that she has history of hypo-kalemia and she takes potassium.          Past Medical History  Diagnosis Date  . Osteoarthritis   . Headache(784.0)     migraine like per patient  . Hypertension   . Low back pain     dr phillips, pain management  . Degenerative joint disease   . Gallstones   . GERD (gastroesophageal reflux disease)   . DDD (degenerative disc disease), cervical   . Colon polyps   . Diverticulosis   . Diabetes mellitus without complication ([place])   . Breast cancer of lower-inner quadrant of right female breast ([place]) 08/08/2015  . Breast cancer [facility])    Past Surgical History  Procedure Laterality Date  . Replacement total knee Right     right - dr Novella Olive  . Ganglion cyst excision Right     right wrist  . Lipoma excision Right 02/21/08    [facility] hip, [facility]  . Replacement total knee Left 06/25/09    left - dr Marciano Sequin [facility]  . Tonsillectomy    . Joint replacement      both knees have been done, four times each per patient. Her body rejects the implant, no infection.  . Abdominal hysterectomy  06/22/06    lap, dr Mancel Bale  . Other surgical history      lipoma removed from back 2012   . Cholecystectomy  10/28/2011    Procedure: LAPAROSCOPIC CHOLECYSTECTOMY WITH INTRAOPERATIVE CHOLANGIOGRAM;  Surgeon: Earnstine Regal, MD;  [facility];  Service: General;  Laterality: N/A;  . Mass excision  08/24/2012    Procedure: EXCISION MASS;  Surgeon: Earnstine Regal, MD;  Location: [place];  Service: General;  Laterality: Left;  excisie soft tissue masses left shoulder(back) &  left upper arm  . Colonoscopy  02-03-13    per Dr. Henrene Pastor, diverticuli but no polyps, repeat in 5 yrs   . Mass excision Left 01/24/2014    Procedure: EXCISION SOFT TISSUE MASSES LOWER LEFT BACK;  Surgeon: Earnstine Regal, MD;  Location: [place];  Service: General;  Laterality: Left;  . Esophagogastroduodenoscopy  02-03-13    per Dr. Henrene Pastor, normal   . Cardiac catheterization      back in 200 by Dr. Stanford Breed  . Tonsillectomy    . Mastectomy w/ sentinel node biopsy Right 10/02/2015  . Mastectomy w/ sentinel node biopsy Right 10/01/2015    Procedure: RIGHT MASTECTOMY WITH SENTINEL LYMPH NODE BIOPSY;  Surgeon: Autumn Messing III, MD;  Location: [place];  Service: General;  Laterality: Right;  . Portacath placement Left 11/01/2015    Procedure: INSERTION PORT-A-CATH;  Surgeon: Autumn Messing III, MD;  Location: [place];  Service: General;  Laterality: Left;   Family History  Problem Relation Age of Onset  . Crohn's disease Other     nephew  . Stomach cancer Mother   . Heart disease Father   . Prostate cancer Father   . Kidney cancer Sister  one out of the four  . Hypertension Sister   . Hypertension Brother   . Colon cancer Maternal Grandmother    Social History  Substance Use Topics  . Smoking status: Current Every Day Smoker -- 0.25 packs/day for 20 years    Types: Cigarettes  . Smokeless tobacco: Former Systems developer    Quit date: 09/30/2015     Comment: quit  . Alcohol Use: No   OB History    No data available     Review of Systems  All other systems reviewed and are negative  Allergies  Codeine; Latex; and Sulfonamide derivatives  Home Medications   Prior to Admission medications   Medication Sig Start Date End Date Taking? Authorizing Provider  amitriptyline (ELAVIL) 25 MG tablet Take 25 mg by mouth at bedtime.    Yes Historical Provider, MD  amLODipine (NORVASC) 5 MG tablet Take 1 tablet (5 mg total) by mouth 2 (two) times daily. 03/05/15  Yes Laurey Morale, MD  furosemide (LASIX) 20 MG tablet TAKE 1 TABLET BY MOUTH EVERY DAY 11/16/15  Yes Laurey Morale, MD  gabapentin (NEURONTIN) 300 MG capsule Take 1 capsule (300 mg total) by mouth at bedtime. 12/31/15  Yes Chauncey Cruel, MD  lidocaine-prilocaine (EMLA) cream Apply 1 application topically as needed. 11/05/15  Yes Chauncey Cruel, MD  lisinopril (PRINIVIL,ZESTRIL) 10 MG tablet Take 1 tablet (10 mg total) by mouth daily. 03/12/15  Yes Laurey Morale, MD  metFORMIN (GLUCOPHAGE) 500 MG tablet Take 1 tablet (500 mg total) by mouth 2 (two) times daily with a meal. 03/12/15  Yes Laurey Morale, MD  methadone (DOLOPHINE) 10 MG tablet Take 10 mg by mouth every 8 (eight) hours as needed for moderate pain.    Yes Historical Provider, MD  metoprolol (LOPRESSOR) 50 MG tablet Take 100 mg by mouth 2 (two) times daily.   Yes Historical Provider, MD  omeprazole (PRILOSEC) 40 MG capsule Take 1 capsule (40 mg total) by mouth daily. 03/05/15  Yes Laurey Morale, MD  ondansetron (ZOFRAN) 8 MG tablet TK 1 T PO BID PRN. START ON THE THIRD AFTER CHEMOTHERAPY 11/05/15  Yes Historical Provider, MD  oxyCODONE (ROXICODONE) 15 MG immediate release tablet Take 15 mg by mouth every 6 (six) hours as needed for pain. Reported on 12/10/2015 11/28/15  Yes Historical Provider, MD  potassium chloride (K-DUR) 10 MEQ tablet Take 3 tablets (30 mEq total) by mouth 2 (two) times daily. Patient taking differently: Take 20 mEq by mouth 2 (two) times daily.  06/18/15  Yes Laurey Morale, MD  prochlorperazine (COMPAZINE) 10 MG tablet TK 1 T PO Q 6 H PRN NV 11/05/15  Yes Historical Provider, MD  temazepam (RESTORIL) 30 MG capsule TAKE ONE CAPSULE BY MOUTH AT BEDTIME AS NEEDED FOR SLEEP Patient taking differently: TAKE ONE CAPSULE BY MOUTH AT BEDTIME 09/10/15  Yes Laurey Morale, MD  cephALEXin (KEFLEX) 500 MG capsule Take 1 capsule (500 mg total) by mouth 4 (four) times daily. 01/09/16   Leonard Schwartz, MD  fluconazole (DIFLUCAN) 150 MG tablet Take 1  tablet (150 mg total) by mouth once. Can repeat dose x1 in 3 days. 01/14/16   Laurie Panda, NP   BP 162/87 mmHg  Pulse 72  Temp(Src) 98 F (36.7 C) (Oral)  Resp 18  SpO2 98% Physical Exam  Constitutional: She is oriented to person, place, and time. She appears well-developed and well-nourished. No distress.  HENT:  Head: Normocephalic and atraumatic.  Eyes: Pupils are equal, round, and reactive to light.  Neck: Normal range of motion.  Cardiovascular: Normal rate and intact distal pulses.   Pulmonary/Chest: No respiratory distress.  Abdominal: Normal appearance. She exhibits no distension. There is no tenderness. There is no rebound.  Musculoskeletal: Normal range of motion.  Neurological: She is alert and oriented to person, place, and time. No cranial nerve deficit.  Skin: Skin is warm and dry. No rash noted.  Psychiatric: She has a normal mood and affect. Her behavior is normal.  Nursing note and vitals reviewed.   ED Course  Procedures (including critical care time) Medications  potassium chloride 10 mEq in 100 mL IVPB (0 mEq Intravenous Stopped 01/09/16 2237)  cefTRIAXone (ROCEPHIN) 1 g in dextrose 5 % 50 mL IVPB (0 g Intravenous Stopped 01/09/16 2208)  heparin lock flush 100 unit/mL (500 Units Intracatheter Given 01/09/16 2323)    Labs Review Labs Reviewed  URINALYSIS, ROUTINE W REFLEX MICROSCOPIC (NOT AT [facility]) - Abnormal; Notable for the following:    Hgb urine dipstick TRACE (*)    Leukocytes, UA MODERATE (*)    All other components within normal limits  CBC WITH DIFFERENTIAL/PLATELET - Abnormal; Notable for the following:    RBC 3.74 (*)    Hemoglobin 11.0 (*)    HCT 33.0 (*)    RDW 17.1 (*)    All other components within normal limits  BASIC METABOLIC PANEL - Abnormal; Notable for the following:    Potassium 2.7 (*)    Glucose, Bld 144 (*)    All other components within normal limits  URINE MICROSCOPIC-ADD ON - Abnormal; Notable for the following:     Squamous Epithelial / LPF 0-5 (*)    Bacteria, UA FEW (*)    All other components within normal limits  URINE CULTURE  MAGNESIUM    Imaging Review No results found. I have personally reviewed and evaluated these images and lab results as part of my medical decision-making.   Date: 01/09/2016  Rate: 75  Rhythm: normal sinus rhythm  QRS Axis: normal  Intervals: normal  ST/T Wave abnormalities: normal  Conduction Disutrbances: QTc=485  Narrative Interpretation: borderline ECG      MDM   Final diagnoses:  UTI (lower urinary tract infection)  Hypokalemia        Leonard Schwartz, MD 01/25/16 1550

## 2016-01-09 NOTE — Discharge Instructions (Signed)
Hypokalemia °Hypokalemia means that the amount of potassium in the blood is lower than normal. Potassium is a chemical, called an electrolyte, that helps regulate the amount of fluid in the body. It also stimulates muscle contraction and helps nerves function properly. Most of the body's potassium is inside of cells, and only a very small amount is in the blood. Because the amount in the blood is so small, minor changes can be life-threatening. °CAUSES °· Antibiotics. °· Diarrhea or vomiting. °· Using laxatives too much, which can cause diarrhea. °· Chronic kidney disease. °· Water pills (diuretics). °· Eating disorders (bulimia). °· Low magnesium level. °· Sweating a lot. °SIGNS AND SYMPTOMS °· Weakness. °· Constipation. °· Fatigue. °· Muscle cramps. °· Mental confusion. °· Skipped heartbeats or irregular heartbeat (palpitations). °· Tingling or numbness. °DIAGNOSIS  °Your health care provider can diagnose hypokalemia with blood tests. In addition to checking your potassium level, your health care provider may also check other lab tests. °TREATMENT °Hypokalemia can be treated with potassium supplements taken by mouth or adjustments in your current medicines. If your potassium level is very low, you may need to get potassium through a vein (IV) and be monitored in the hospital. A diet high in potassium is also helpful. Foods high in potassium are: °· Nuts, such as peanuts and pistachios. °· Seeds, such as sunflower seeds and pumpkin seeds. °· Peas, lentils, and lima beans. °· Whole grain and bran cereals and breads. °· Fresh fruit and vegetables, such as apricots, avocado, bananas, cantaloupe, kiwi, oranges, tomatoes, asparagus, and potatoes. °· Orange and tomato juices. °· Red meats. °· Fruit yogurt. °HOME CARE INSTRUCTIONS °· Take all medicines as prescribed by your health care provider. °· Maintain a healthy diet by including nutritious food, such as fruits, vegetables, nuts, whole grains, and lean meats. °· If  you are taking a laxative, be sure to follow the directions on the label. °SEEK MEDICAL CARE IF: °· Your weakness gets worse. °· You feel your heart pounding or racing. °· You are vomiting or having diarrhea. °· You are diabetic and having trouble keeping your blood glucose in the normal range. °SEEK IMMEDIATE MEDICAL CARE IF: °· You have chest pain, shortness of breath, or dizziness. °· You are vomiting or having diarrhea for more than 2 days. °· You faint. °MAKE SURE YOU:  °· Understand these instructions. °· Will watch your condition. °· Will get help right away if you are not doing well or get worse. °  °This information is not intended to replace advice given to you by your health care provider. Make sure you discuss any questions you have with your health care provider. °  °Document Released: 09/08/2005 Document Revised: 09/29/2014 Document Reviewed: 03/11/2013 °Elsevier Interactive Patient Education ©2016 [employer] ° °Potassium Content of Foods °Potassium is a mineral found in many foods and drinks. It helps keep fluids and minerals balanced in your body and affects how steadily your heart beats. Potassium also helps control your blood pressure and keep your muscles and nervous system healthy. °Certain health conditions and medicines may change the balance of potassium in your body. When this happens, you can help balance your level of potassium through the foods that you do or do not eat. Your health care provider or dietitian may recommend an amount of potassium that you should have each day. The following lists of foods provide the amount of potassium (in parentheses) per serving in each item. °HIGH IN POTASSIUM  °The following foods and beverages have 200 mg   or more of potassium per serving:  Apricots, 2 raw or 5 dry (200 mg).  Artichoke, 1 medium (345 mg).  Avocado, raw,  each (245 mg).  Banana, 1 medium (425 mg).  Beans, lima, or baked beans, canned,  cup (280 mg).  Beans, white,  canned,  cup (595 mg).  Beef roast, 3 oz (320 mg).  Beef, ground, 3 oz (270 mg).  Beets, raw or cooked,  cup (260 mg).  Bran muffin, 2 oz (300 mg).  Broccoli,  cup (230 mg).  Brussels sprouts,  cup (250 mg).  Cantaloupe,  cup (215 mg).  Cereal, 100% bran,  cup (200-400 mg).  Cheeseburger, single, fast food, 1 each (225-400 mg).  Chicken, 3 oz (220 mg).  Clams, canned, 3 oz (535 mg).  Crab, 3 oz (225 mg).  Dates, 5 each (270 mg).  Dried beans and peas,  cup (300-475 mg).  Figs, dried, 2 each (260 mg).  Fish: halibut, tuna, cod, snapper, 3 oz (480 mg).  Fish: salmon, haddock, swordfish, perch, 3 oz (300 mg).  Fish, tuna, canned 3 oz (200 mg).  [place] fries, fast food, 3 oz (470 mg).  Granola with fruit and nuts,  cup (200 mg).  Grapefruit juice,  cup (200 mg).  Greens, beet,  cup (655 mg).  Honeydew melon,  cup (200 mg).  Kale, raw, 1 cup (300 mg).  Kiwi, 1 medium (240 mg).  Kohlrabi, rutabaga, parsnips,  cup (280 mg).  Lentils,  cup (365 mg).  Mango, 1 each (325 mg).  Milk, chocolate, 1 cup (420 mg).  Milk: nonfat, low-fat, whole, buttermilk, 1 cup (350-380 mg).  Molasses, 1 Tbsp (295 mg).  Mushrooms,  cup (280) mg.  Nectarine, 1 each (275 mg).  Nuts: almonds, peanuts, hazelnuts, [place], cashew, mixed, 1 oz (200 mg).  Nuts, pistachios, 1 oz (295 mg).  Orange, 1 each (240 mg).  Orange juice,  cup (235 mg).  Papaya, medium,  fruit (390 mg).  Peanut butter, chunky, 2 Tbsp (240 mg).  Peanut butter, smooth, 2 Tbsp (210 mg).  Pear, 1 medium (200 mg).  Pomegranate, 1 whole (400 mg).  Pomegranate juice,  cup (215 mg).  Pork, 3 oz (350 mg).  Potato chips, salted, 1 oz (465 mg).  Potato, baked with skin, 1 medium (925 mg).  Potatoes, boiled,  cup (255 mg).  Potatoes, mashed,  cup (330 mg).  Prune juice,  cup (370 mg).  Prunes, 5 each (305 mg).  Pudding, chocolate,  cup (230 mg).  Pumpkin, canned,  cup  (250 mg).  Raisins, seedless,  cup (270 mg).  Seeds, sunflower or pumpkin, 1 oz (240 mg).  Soy milk, 1 cup (300 mg).  Spinach,  cup (420 mg).  Spinach, canned,  cup (370 mg).  Sweet potato, baked with skin, 1 medium (450 mg).  Swiss chard,  cup (480 mg).  Tomato or vegetable juice,  cup (275 mg).  Tomato sauce or puree,  cup (400-550 mg).  Tomato, raw, 1 medium (290 mg).  Tomatoes, canned,  cup (200-300 mg).  [place], 3 oz (250 mg).  Wheat germ, 1 oz (250 mg).  Winter squash,  cup (250 mg).  Yogurt, plain or fruited, 6 oz (260-435 mg).  Zucchini,  cup (220 mg). MODERATE IN POTASSIUM The following foods and beverages have 50-200 mg of potassium per serving:  Apple, 1 each (150 mg).  Apple juice,  cup (150 mg).  Applesauce,  cup (90 mg).  Apricot nectar,  cup (140 mg).  Asparagus, small  spears, ½ cup or 6 spears (155 mg). °· Bagel, cinnamon raisin, 1 each (130 mg). °· Bagel, egg or plain, 4 in., 1 each (70 mg). °· Beans, green, ½ cup (90 mg). °· Beans, yellow, ½ cup (190 mg). °· Beer, regular, 12 oz (100 mg). °· Beets, canned, ½ cup (125 mg). °· Blackberries, ½ cup (115 mg). °· Blueberries, ½ cup (60 mg). °· Bread, whole wheat, 1 slice (70 mg). °· Broccoli, raw, ½ cup (145 mg). °· Cabbage, ½ cup (150 mg). °· Carrots, cooked or raw, ½ cup (180 mg). °· Cauliflower, raw, ½ cup (150 mg). °· Celery, raw, ½ cup (155 mg). °· Cereal, bran flakes, ½cup (120-150 mg). °· Cheese, cottage, ½ cup (110 mg). °· Cherries, 10 each (150 mg). °· Chocolate, 1½ oz bar (165 mg). °· Coffee, brewed 6 oz (90 mg). °· Corn, ½ cup or 1 ear (195 mg). °· Cucumbers, ½ cup (80 mg). °· Egg, large, 1 each (60 mg). °· Eggplant, ½ cup (60 mg). °· Endive, raw, ½cup (80 mg). °· English muffin, 1 each (65 mg). °· Fish, orange roughy, 3 oz (150 mg). °· Frankfurter, beef or pork, 1 each (75 mg). °· Fruit cocktail, ½ cup (115 mg). °· Grape juice, ½ cup (170 mg). °· Grapefruit, ½ fruit (175 mg). °· Grapes,  ½ cup (155 mg). °· Greens: kale, turnip, collard, ½ cup (110-150 mg). °· Ice cream or frozen yogurt, chocolate, ½ cup (175 mg). °· Ice cream or frozen yogurt, vanilla, ½ cup (120-150 mg). °· Lemons, limes, 1 each (80 mg). °· Lettuce, all types, 1 cup (100 mg). °· Mixed vegetables, ½ cup (150 mg). °· Mushrooms, raw, ½ cup (110 mg). °· Nuts: walnuts, pecans, or macadamia, 1 oz (125 mg). °· Oatmeal, ½ cup (80 mg). °· [place], ½ cup (110 mg). °· Onions, raw, ½ cup (120 mg). °· Peach, 1 each (185 mg). °· Peaches, canned, ½ cup (120 mg). °· Pears, canned, ½ cup (120 mg). °· Peas, green, frozen, ½ cup (90 mg). °· Peppers, green, ½ cup (130 mg). °· Peppers, red, ½ cup (160 mg). °· Pineapple juice, ½ cup (165 mg). °· Pineapple, fresh or canned, ½ cup (100 mg). °· Plums, 1 each (105 mg). °· Pudding, vanilla, ½ cup (150 mg). °· Raspberries, ½ cup (90 mg). °· Rhubarb, ½ cup (115 mg). °· Rice, wild, ½ cup (80 mg). °· Shrimp, 3 oz (155 mg). °· Spinach, raw, 1 cup (170 mg). °· Strawberries, ½ cup (125 mg). °· Summer squash ½ cup (175-200 mg). °· Swiss chard, raw, 1 cup (135 mg). °· Tangerines, 1 each (140 mg). °· Tea, brewed, 6 oz (65 mg). °· Turnips, ½ cup (140 mg). °· Watermelon, ½ cup (85 mg). °· Wine, red, table, 5 oz (180 mg). °· Wine, white, table, 5 oz (100 mg). °LOW IN POTASSIUM °The following foods and beverages have less than 50 mg of potassium per serving. °· Bread, white, 1 slice (30 mg). °· Carbonated beverages, 12 oz (less than 5 mg). °· Cheese, 1 oz (20-30 mg). °· Cranberries, ½ cup (45 mg). °· Cranberry juice cocktail, ½ cup (20 mg). °· Fats and oils, 1 Tbsp (less than 5 mg). °· Hummus, 1 Tbsp (32 mg). °· Nectar: papaya, mango, or pear, ½ cup (35 mg). °· Rice, white or brown, ½ cup (50 mg). °· Spaghetti or macaroni, ½ cup cooked (30 mg). °· Tortilla, flour or corn, 1 each (50 mg). °· Waffle, 4 in., 1 each (50 mg). °· Water   chestnuts,  cup (40 mg).   This information is not intended to replace advice given to you by  your health care provider. Make sure you discuss any questions you have with your health care provider.   Document Released: 04/22/2005 Document Revised: 09/13/2013 Document Reviewed: 08/05/2013 Elsevier Interactive Patient Education 2016 [employer]  Urinary Tract Infection Urinary tract infections (UTIs) can develop anywhere along your urinary tract. Your urinary tract is your body's drainage system for removing wastes and extra water. Your urinary tract includes two kidneys, two ureters, a bladder, and a urethra. Your kidneys are a pair of bean-shaped organs. Each kidney is about the size of your fist. They are located below your ribs, one on each side of your spine. CAUSES Infections are caused by microbes, which are microscopic organisms, including fungi, viruses, and bacteria. These organisms are so small that they can only be seen through a microscope. Bacteria are the microbes that most commonly cause UTIs. SYMPTOMS  Symptoms of UTIs may vary by age and gender of the patient and by the location of the infection. Symptoms in young women typically include a frequent and intense urge to urinate and a painful, burning feeling in the bladder or urethra during urination. Older women and men are more likely to be tired, shaky, and weak and have muscle aches and abdominal pain. A fever may mean the infection is in your kidneys. Other symptoms of a kidney infection include pain in your back or sides below the ribs, nausea, and vomiting. DIAGNOSIS To diagnose a UTI, your caregiver will ask you about your symptoms. Your caregiver will also ask you to provide a urine sample. The urine sample will be tested for bacteria and white blood cells. White blood cells are made by your body to help fight infection. TREATMENT  Typically, UTIs can be treated with medication. Because most UTIs are caused by a bacterial infection, they usually can be treated with the use of antibiotics. The choice of antibiotic and  length of treatment depend on your symptoms and the type of bacteria causing your infection. HOME CARE INSTRUCTIONS  If you were prescribed antibiotics, take them exactly as your caregiver instructs you. Finish the medication even if you feel better after you have only taken some of the medication.  Drink enough water and fluids to keep your urine clear or pale yellow.  Avoid caffeine, tea, and carbonated beverages. They tend to irritate your bladder.  Empty your bladder often. Avoid holding urine for long periods of time.  Empty your bladder before and after sexual intercourse.  After a bowel movement, women should cleanse from front to back. Use each tissue only once. SEEK MEDICAL CARE IF:   You have back pain.  You develop a fever.  Your symptoms do not begin to resolve within 3 days. SEEK IMMEDIATE MEDICAL CARE IF:   You have severe back pain or lower abdominal pain.  You develop chills.  You have nausea or vomiting.  You have continued burning or discomfort with urination. MAKE SURE YOU:   Understand these instructions.  Will watch your condition.  Will get help right away if you are not doing well or get worse.   This information is not intended to replace advice given to you by your health care provider. Make sure you discuss any questions you have with your health care provider.   Document Released: 06/18/2005 Document Revised: 05/30/2015 Document Reviewed: 10/17/2011 [employer] Interactive Patient Education [employer].

## 2016-01-11 LAB — URINE CULTURE

## 2016-01-14 ENCOUNTER — Ambulatory Visit (HOSPITAL_BASED_OUTPATIENT_CLINIC_OR_DEPARTMENT_OTHER): Payer: 59

## 2016-01-14 ENCOUNTER — Encounter: Payer: Self-pay | Admitting: Nurse Practitioner

## 2016-01-14 ENCOUNTER — Other Ambulatory Visit (HOSPITAL_BASED_OUTPATIENT_CLINIC_OR_DEPARTMENT_OTHER): Payer: 59

## 2016-01-14 ENCOUNTER — Ambulatory Visit (HOSPITAL_BASED_OUTPATIENT_CLINIC_OR_DEPARTMENT_OTHER): Payer: 59 | Admitting: Nurse Practitioner

## 2016-01-14 ENCOUNTER — Telehealth: Payer: Self-pay | Admitting: Nurse Practitioner

## 2016-01-14 VITALS — BP 142/79 | HR 68 | Temp 97.9°F | Resp 18 | Wt 261.2 lb

## 2016-01-14 DIAGNOSIS — T888XXA Other specified complications of surgical and medical care, not elsewhere classified, initial encounter: Secondary | ICD-10-CM

## 2016-01-14 DIAGNOSIS — E876 Hypokalemia: Secondary | ICD-10-CM

## 2016-01-14 DIAGNOSIS — C50311 Malignant neoplasm of lower-inner quadrant of right female breast: Secondary | ICD-10-CM

## 2016-01-14 DIAGNOSIS — B379 Candidiasis, unspecified: Secondary | ICD-10-CM | POA: Diagnosis not present

## 2016-01-14 DIAGNOSIS — Z5111 Encounter for antineoplastic chemotherapy: Secondary | ICD-10-CM | POA: Diagnosis not present

## 2016-01-14 DIAGNOSIS — Z170 Estrogen receptor positive status [ER+]: Secondary | ICD-10-CM | POA: Diagnosis not present

## 2016-01-14 DIAGNOSIS — M79621 Pain in right upper arm: Secondary | ICD-10-CM | POA: Diagnosis not present

## 2016-01-14 LAB — CBC WITH DIFFERENTIAL/PLATELET
BASO%: 0.3 % (ref 0.0–2.0)
Basophils Absolute: 0 10*3/uL (ref 0.0–0.1)
EOS%: 0.9 % (ref 0.0–7.0)
Eosinophils Absolute: 0.1 10*3/uL (ref 0.0–0.5)
HCT: 32.6 % — ABNORMAL LOW (ref 34.8–46.6)
HGB: 10.7 g/dL — ABNORMAL LOW (ref 11.6–15.9)
LYMPH%: 30.5 % (ref 14.0–49.7)
MCH: 30.1 pg (ref 25.1–34.0)
MCHC: 32.8 g/dL (ref 31.5–36.0)
MCV: 91.8 fL (ref 79.5–101.0)
MONO#: 0.6 10*3/uL (ref 0.1–0.9)
MONO%: 8.3 % (ref 0.0–14.0)
NEUT#: 4.1 10*3/uL (ref 1.5–6.5)
NEUT%: 60 % (ref 38.4–76.8)
Platelets: 316 10*3/uL (ref 145–400)
RBC: 3.55 10*6/uL — ABNORMAL LOW (ref 3.70–5.45)
RDW: 17.3 % — ABNORMAL HIGH (ref 11.2–14.5)
WBC: 6.8 10*3/uL (ref 3.9–10.3)
lymph#: 2.1 10*3/uL (ref 0.9–3.3)

## 2016-01-14 LAB — COMPREHENSIVE METABOLIC PANEL
ALT: 13 U/L (ref 0–55)
AST: 16 U/L (ref 5–34)
Albumin: 3.8 g/dL (ref 3.5–5.0)
Alkaline Phosphatase: 61 U/L (ref 40–150)
Anion Gap: 10 mEq/L (ref 3–11)
BUN: 11.3 mg/dL (ref 7.0–26.0)
CO2: 25 mEq/L (ref 22–29)
Calcium: 9.3 mg/dL (ref 8.4–10.4)
Chloride: 106 mEq/L (ref 98–109)
Creatinine: 0.8 mg/dL (ref 0.6–1.1)
EGFR: >90 mL/min/{1.73_m2} (ref >90)
Glucose: 93 mg/dl (ref 70–140)
Potassium: 3.7 mEq/L (ref 3.5–5.1)
Sodium: 142 mEq/L (ref 136–145)
Total Bilirubin: <0.3 mg/dL (ref 0.20–1.20)
Total Protein: 6.5 g/dL (ref 6.4–8.3)

## 2016-01-14 MED ORDER — HEPARIN SOD (PORK) LOCK FLUSH 100 UNIT/ML IV SOLN
500.0000 [IU] | Freq: Once | INTRAVENOUS | Status: AC | PRN
  Administered 2016-01-14: 500 [IU]
  Filled 2016-01-14: qty 5

## 2016-01-14 MED ORDER — FAMOTIDINE IN NACL 20-0.9 MG/50ML-% IV SOLN
20.0000 mg | Freq: Once | INTRAVENOUS | Status: AC
  Administered 2016-01-14: 20 mg via INTRAVENOUS

## 2016-01-14 MED ORDER — SODIUM CHLORIDE 0.9% FLUSH
10.0000 mL | INTRAVENOUS | Status: DC | PRN
  Administered 2016-01-14: 10 mL
  Filled 2016-01-14: qty 10

## 2016-01-14 MED ORDER — SODIUM CHLORIDE 0.9 % IV SOLN
Freq: Once | INTRAVENOUS | Status: AC
  Administered 2016-01-14: 13:00:00 via INTRAVENOUS

## 2016-01-14 MED ORDER — FAMOTIDINE IN NACL 20-0.9 MG/50ML-% IV SOLN
INTRAVENOUS | Status: AC
  Filled 2016-01-14: qty 50

## 2016-01-14 MED ORDER — DIPHENHYDRAMINE HCL 50 MG/ML IJ SOLN
25.0000 mg | Freq: Once | INTRAMUSCULAR | Status: AC
  Administered 2016-01-14: 25 mg via INTRAVENOUS

## 2016-01-14 MED ORDER — DIPHENHYDRAMINE HCL 50 MG/ML IJ SOLN
INTRAMUSCULAR | Status: AC
  Filled 2016-01-14: qty 1

## 2016-01-14 MED ORDER — FLUCONAZOLE 150 MG PO TABS: 150.0000 mg | ORAL_TABLET | Freq: Once | ORAL | Status: DC

## 2016-01-14 MED ORDER — SODIUM CHLORIDE 0.9 % IV SOLN
80.0000 mg/m2 | Freq: Once | INTRAVENOUS | Status: AC
  Administered 2016-01-14: 186 mg via INTRAVENOUS
  Filled 2016-01-14: qty 31

## 2016-01-14 MED ORDER — SODIUM CHLORIDE 0.9 % IV SOLN
10.0000 mg | Freq: Once | INTRAVENOUS | Status: AC
  Administered 2016-01-14: 10 mg via INTRAVENOUS
  Filled 2016-01-14: qty 1

## 2016-01-14 NOTE — Progress Notes (Signed)
[name]  [phone] [phone]   ID: RITAJ DULLEA DOB: 1961-09-13  MR#: [id number]  YWV#:[id number]  Patient Care Team: Laurey Morale, MD as PCP - [place] III, MD as Consulting Physician (General Surgery) Chauncey Cruel, MD as Consulting Physician (Oncology) Thea Silversmith, MD as Consulting Physician (Radiation Oncology) Sylvan Cheese, NP as Nurse Practitioner (Hematology and Oncology) PCP: Laurey Morale, MD GYN: OTHER MD: Mardi Mainland MD, Nicholaus Bloom MD  CHIEF COMPLAINT: Multicentric breast cancer  CURRENT TREATMENT: Adjuvant chemotherapy ongoing  BREAST CANCER HISTORY: From the original intake note:  [name] herself noted a change in her right breast in October and brought it to the attention of her primary care physician. Her last mammogram had been April 2010. Dr. Sarajane Jews set [name] up for bilateral diagnostic mammography with tomosynthesis and [facility] November 04/11/2015. This found the breast density to be category B. In the right breast there were 3 microlobulated masses in the lower inner quadrant measuring 1.9, 1.5, and 1.5 cm. These were multicentric. There were also diffuse fine pleomorphic calcifications surrounding these masses, that area spanning 11.6 cm. The masses were palpable by exam at 4:00 8 cm from the nipple and at 5:00 3 cm from the nipple. By ultrasonography there was an irregular hypoechoic mass in the right breast at 4:00 measuring 1.7 cm, 1 medial to this measuring 1.1 cm, and then the third irregular hypoechoic mass at the 5:00 position measuring 1.3 cm. The right axilla showed multiple lymph nodes with thickened cortices. The largest lymph node measured 0.9 cm.  On 08/02/2015, [name] underwent biopsy of all 3 breast masses as well as a right axillary lymph node. 2 of the tumors were similar invasive ductal carcinomas, grade 2, estrogen receptor 95%  positive, progesterone receptor 5% positive, with an MIB-1 of 10%, and HER-2 equivocal, with a signals ratio of 1.30 and the number per cell 4.0. The third tumor appeared's grade 1 or 2 was also estrogen receptor positive at 95%, progesterone receptor positive at 5%, with an MIP-1 of 5%, and a similar HER-2 profile the signals ratio being 1.46 but the number per cell being only 3.95. By immunohistochemistry on this tumor HER-2 was negative at 1+. Immunohistochemistry on the equivocal reading is pending.  [name]'s subsequent history is as detailed below  INTERVAL HISTORY: [name] returns today for follow-up of her estrogen receptor positive breast cancer, accompanied by her husband. Today she is due for cycle 2 of 12 planned doses of paclitaxel. The interval history is remarkable for a urinary tract infection this past weekend. She presented to the ED and was prescribed keflex. During this time her potassium was again found to be critical. She was supplemented with potassium IV. Her UTI symptoms are resolving but now she feels like she has a yeast infection.  REVIEW OF SYSTEMS: Izetta denies fevers or chills. She had mild nausea but this was well controlled with her PRN antiemetics. She is moving her bowels well. Her appetite is good. She denies mouth sores, rashes, or neuropathy symptoms. She is having more right mastectomy and right axilla pain. The area is now puffy. She continues on methadone TID For hip pain and ambulates with a cane. She noticed a little more back pain  She takes 369m gabapentin for hot flashes. This also helps her sleep. A detailed review of systems is otherwise stable.  PAST MEDICAL HISTORY: Past Medical History  Diagnosis Date  . Osteoarthritis   .  Headache(784.0)     migraine like per patient  . Hypertension   . Low back pain     dr phillips, pain management  . Degenerative joint disease   . Gallstones   . GERD (gastroesophageal reflux disease)   . DDD (degenerative  disc disease), cervical   . Colon polyps   . Diverticulosis   . Diabetes mellitus without complication ([place])   . Breast cancer of lower-inner quadrant of right female breast ([place]) 08/08/2015  . Breast cancer ([place])     PAST SURGICAL HISTORY: Past Surgical History  Procedure Laterality Date  . Replacement total knee Right     right - dr Novella Olive  . Ganglion cyst excision Right     right wrist  . Lipoma excision Right 02/21/08    right hip, [facility]  . Replacement total knee Left 06/25/09    left - dr Marciano Sequin [facility]  . Tonsillectomy    . Joint replacement      both knees have been done, four times each per patient. Her body rejects the implant, no infection.  . Abdominal hysterectomy  06/22/06    lap, dr Mancel Bale  . Other surgical history      lipoma removed from back 2012   . Cholecystectomy  10/28/2011    Procedure: LAPAROSCOPIC CHOLECYSTECTOMY WITH INTRAOPERATIVE CHOLANGIOGRAM;  Surgeon: Earnstine Regal, MD;  Location: [facility];  Service: General;  Laterality: N/A;  . Mass excision  08/24/2012    Procedure: EXCISION MASS;  Surgeon: Earnstine Regal, MD;  Location: [facility];  Service: General;  Laterality: Left;  excisie soft tissue masses left shoulder(back) & left upper arm  . Colonoscopy  02-03-13    per Dr. Henrene Pastor, diverticuli but no polyps, repeat in 5 yrs   . Mass excision Left 01/24/2014    Procedure: EXCISION SOFT TISSUE MASSES LOWER LEFT BACK;  Surgeon: Earnstine Regal, MD;  Location: [place] [place];  Service: General;  Laterality: Left;  . Esophagogastroduodenoscopy  02-03-13    per Dr. Henrene Pastor, normal   . Cardiac catheterization      back in 200 by Dr. Stanford Breed  . Tonsillectomy    . Mastectomy w/ sentinel node biopsy Right 10/02/2015  . Mastectomy w/ sentinel node biopsy Right 10/01/2015    Procedure: RIGHT MASTECTOMY WITH SENTINEL LYMPH NODE BIOPSY;  Surgeon: Autumn Messing III, MD;  Location: [place];  Service: General;  Laterality: Right;  . Portacath placement Left  11/01/2015    Procedure: INSERTION PORT-A-CATH;  Surgeon: Autumn Messing III, MD;  Location: [facility];  Service: General;  Laterality: Left;    FAMILY HISTORY Family History  Problem Relation Age of Onset  . Crohn's disease Other     nephew  . Stomach cancer Mother   . Heart disease Father   . Prostate cancer Father   . Kidney cancer Sister     one out of the four  . Hypertension Sister   . Hypertension Brother   . Colon cancer Maternal Grandmother   The patient's father died at the age of 59 with congestive heart failure. Her mother is still living as of November 2016, age 60. The patient had 6 brothers, 4 sisters. One sister was diagnosed with kidney cancer at age 17. ([place]). She is doing well. The patient's maternal grandmother was diagnosed with colon cancer at the age of 45. The patient's mother was diagnosed with a "rare stomach cancer" 20 years ago. The patient's father was diagnosed  with prostate cancer at age 25. There is no history of breast or ovarian cancer in the family to her knowledge.   GYNECOLOGIC HISTORY:  No LMP recorded. Patient has had a hysterectomy.  menarche age 89, first live birth age 89, the patient is GX P3. She underwent remote hysterectomy with bilateral salpingo-oophorectomy. She did not use hormone replacement. She never used oral contraceptives.   SOCIAL HISTORY:  Caragh used to work as a "[employer] but she is now disabled because of her multiple arthritic and degenerative problems. Her husband Elberta Fortis A. Spieth works as a Freight forwarder. Son Dalphine Handing works in long care in [place]. Son Evette Doffing works for a Arboriculturist in [place]. Daughter  Silva Bandy is a Haematologist in [place]. The patient has 5 grandchildren. She attends a Physicist, medical church    ADVANCED DIRECTIVES: Not in place   HEALTH MAINTENANCE: Social History  Substance Use Topics  . Smoking status: Current Every Day Smoker  -- 0.25 packs/day for 20 years    Types: Cigarettes  . Smokeless tobacco: Former Systems developer    Quit date: 09/30/2015     Comment: quit  . Alcohol Use: No     Colonoscopy:February 2016   XIH:WTUUEK post hysterectomy   Bone density:  Lipid panel:  Allergies  Allergen Reactions  . Codeine Itching and Nausea And Vomiting    Itching all over the body  . Latex Hives  . Sulfonamide Derivatives Hives    All over the body    Current Outpatient Prescriptions  Medication Sig Dispense Refill  . amitriptyline (ELAVIL) 25 MG tablet Take 25 mg by mouth at bedtime.     Marland Kitchen amLODipine (NORVASC) 5 MG tablet Take 1 tablet (5 mg total) by mouth 2 (two) times daily. 180 tablet 3  . cephALEXin (KEFLEX) 500 MG capsule Take 1 capsule (500 mg total) by mouth 4 (four) times daily. 20 capsule 0  . furosemide (LASIX) 20 MG tablet TAKE 1 TABLET BY MOUTH EVERY DAY 30 tablet 6  . gabapentin (NEURONTIN) 300 MG capsule Take 1 capsule (300 mg total) by mouth at bedtime. 90 capsule 4  . lidocaine-prilocaine (EMLA) cream Apply 1 application topically as needed. 30 g 0  . lisinopril (PRINIVIL,ZESTRIL) 10 MG tablet Take 1 tablet (10 mg total) by mouth daily. 90 tablet 3  . metFORMIN (GLUCOPHAGE) 500 MG tablet Take 1 tablet (500 mg total) by mouth 2 (two) times daily with a meal. 180 tablet 3  . methadone (DOLOPHINE) 10 MG tablet Take 10 mg by mouth every 8 (eight) hours as needed for moderate pain.     . metoprolol (LOPRESSOR) 50 MG tablet Take 100 mg by mouth 2 (two) times daily.    Marland Kitchen omeprazole (PRILOSEC) 40 MG capsule Take 1 capsule (40 mg total) by mouth daily. 90 capsule 3  . ondansetron (ZOFRAN) 8 MG tablet TK 1 T PO BID PRN. START ON THE THIRD AFTER CHEMOTHERAPY  1  . oxyCODONE (ROXICODONE) 15 MG immediate release tablet Take 15 mg by mouth every 6 (six) hours as needed for pain. Reported on 12/10/2015    . potassium chloride (K-DUR) 10 MEQ tablet Take 3 tablets (30 mEq total) by mouth 2 (two) times daily. (Patient  taking differently: Take 20 mEq by mouth 2 (two) times daily. ) 180 tablet 11  . prochlorperazine (COMPAZINE) 10 MG tablet TK 1 T PO Q 6 H PRN NV  1  . temazepam (RESTORIL) 30 MG capsule TAKE ONE CAPSULE BY  MOUTH AT BEDTIME AS NEEDED FOR SLEEP (Patient taking differently: TAKE ONE CAPSULE BY MOUTH AT BEDTIME) 90 capsule 1  . fluconazole (DIFLUCAN) 150 MG tablet Take 1 tablet (150 mg total) by mouth once. Can repeat dose x1 in 3 days. 2 tablet 0   No current facility-administered medications for this visit.    OBJECTIVE: middle-aged African-American woman Who appears stated age  55 Vitals:   01/14/16 1143  BP: 142/79  Pulse: 68  Temp: 97.9 F (36.6 C)  Resp: 18     Body mass index is 40.9 kg/(m^2).    ECOG FS:2 - Symptomatic, <50% confined to bed   Skin: warm, dry  HEENT: sclerae anicteric, conjunctivae pink, oropharynx clear. No thrush or mucositis.  Lymph Nodes: No cervical or supraclavicular lymphadenopathy  Lungs: clear to auscultation bilaterally, no rales, wheezes, or rhonci  Heart: regular rate and rhythm  Abdomen: round, soft, non tender, positive bowel sounds  Musculoskeletal: No focal spinal tenderness, no peripheral edema  Neuro: non focal, well oriented, positive affect  Breast: right breast status post mastectomy. Right mastectomy lateral skin fold "puffy" and tender. No other skin changes. Right axilla benign but also tender with palpation.   LAB RESULTS:  CMP     Component Value Date/Time   NA 142 01/14/2016 1104   NA 140 01/09/2016 1841   K 3.7 01/14/2016 1104   K 2.7* 01/09/2016 1841   CL 104 01/09/2016 1841   CO2 25 01/14/2016 1104   CO2 24 01/09/2016 1841   GLUCOSE 93 01/14/2016 1104   GLUCOSE 144* 01/09/2016 1841   BUN 11.3 01/14/2016 1104   BUN 15 01/09/2016 1841   CREATININE 0.8 01/14/2016 1104   CREATININE 0.74 01/09/2016 1841   CALCIUM 9.3 01/14/2016 1104   CALCIUM 9.1 01/09/2016 1841   PROT 6.5 01/14/2016 1104   PROT 7.7 06/05/2015 1837    ALBUMIN 3.8 01/14/2016 1104   ALBUMIN 4.8 06/05/2015 1837   AST 16 01/14/2016 1104   AST 24 06/05/2015 1837   ALT 13 01/14/2016 1104   ALT 14 06/05/2015 1837   ALKPHOS 61 01/14/2016 1104   ALKPHOS 69 06/05/2015 1837   BILITOT <0.30 01/14/2016 1104   BILITOT 0.5 06/05/2015 1837   GFRNONAA >60 01/09/2016 1841   GFRAA >60 01/09/2016 1841    INo results found for: SPEP, UPEP  Lab Results  Component Value Date   WBC 6.8 01/14/2016   NEUTROABS 4.1 01/14/2016   HGB 10.7* 01/14/2016   HCT 32.6* 01/14/2016   MCV 91.8 01/14/2016   PLT 316 01/14/2016      Chemistry      Component Value Date/Time   NA 142 01/14/2016 1104   NA 140 01/09/2016 1841   K 3.7 01/14/2016 1104   K 2.7* 01/09/2016 1841   CL 104 01/09/2016 1841   CO2 25 01/14/2016 1104   CO2 24 01/09/2016 1841   BUN 11.3 01/14/2016 1104   BUN 15 01/09/2016 1841   CREATININE 0.8 01/14/2016 1104   CREATININE 0.74 01/09/2016 1841      Component Value Date/Time   CALCIUM 9.3 01/14/2016 1104   CALCIUM 9.1 01/09/2016 1841   ALKPHOS 61 01/14/2016 1104   ALKPHOS 69 06/05/2015 1837   AST 16 01/14/2016 1104   AST 24 06/05/2015 1837   ALT 13 01/14/2016 1104   ALT 14 06/05/2015 1837   BILITOT <0.30 01/14/2016 1104   BILITOT 0.5 06/05/2015 1837       No results found for: LABCA2  No components  found for: FTDDU202  No results for input(s): INR in the last 168 hours.  Urinalysis    Component Value Date/Time   COLORURINE YELLOW 01/09/2016 1948   APPEARANCEUR CLEAR 01/09/2016 1948   LABSPEC 1.020 01/09/2016 1948   PHURINE 6.0 01/09/2016 1948   GLUCOSEU NEGATIVE 01/09/2016 1948   HGBUR TRACE* 01/09/2016 1948   HGBUR large 07/01/2010 0945   BILIRUBINUR NEGATIVE 01/09/2016 1948   BILIRUBINUR n 02/26/2015 [place] 01/09/2016 1948   PROTEINUR NEGATIVE 01/09/2016 1948   PROTEINUR n 02/26/2015 1121   UROBILINOGEN 0.2 06/05/2015 2353   UROBILINOGEN 0.2 02/26/2015 1121   NITRITE NEGATIVE 01/09/2016  1948   NITRITE n 02/26/2015 1121   LEUKOCYTESUR MODERATE* 01/09/2016 1948   STUDIES: No results found.  ASSESSMENT: 55 y.o. [place] woman status post right breast biopsy 3 and axillary lymph node biopsy 08/02/2015 for a clinical mpT1c N0, s invasive ductal carcinoma, grade 1 or 2, estrogen receptor 95% positive, progesterone receptor 5% positive, with an MIB-1 between 5 and 10%, and HER-2 equivocal on 1 of the 2 biopsies (signals ratio 1.30, number per cell 4.0).tage IA   (1) status post right modified radical mastectomy 10/01/2015 for an mpT2 pN0, stage IIA invasive ductal carcinoma, grade 2, with a total of 20 benign lymph nodes removed  (2) Oncotype DX score of 32 predicts an outside the breast recurrence risk of 22% within 10 years if the patient's only systemic therapy is tamoxifen for 5 years. It also predicts significant benefit from adjuvant chemotherapy.  (3) patient completed adjuvant doxorubicin and cyclophosphamide in dose dense fashion 4, last dose 12/24/2015, to be followed as tolerated by paclitaxel weekly 12 Starting 01/07/2016   (4) postmastectomy radiation to follow   (5) anti-estrogens to follow completion of local treatment  (a) consider PALLAS  Study  (b) consider BWEL study  PLAN: The labs were reviewed in detail and were stable. Her potassium is finally normal at 3.7. She will continue on 20 meq potassium supplements BID. She tolerated her first cycle of paclitaxel reasonably well. She will proceed with cycle 2 of paclitaxel as planned today.   I have called in a prescription for diflucan for her yeast infection caused by antibiotic use.  I am having her return to Dr. Marlou Starks. She may have a small fluid collection to the right mastectomy fold. If not this could be lymphedema and we will send her to physical therapy to have this addressed.  Nohelia will return in 1 week for labs and cycle 3 of treatment. She understands and agrees with this plan. She knows the goal  of treatment in her case is cure. She has been encouraged to call with any issues that might arise before her next visit here.   Laurie Panda, NP   01/14/2016 12:36 PM

## 2016-01-14 NOTE — Telephone Encounter (Signed)
appt made and avs will print in treatment room. Dr Marlou Starks office will sent message to nurse to see if pt can get worked in this week. Office will contact pt directly with date/time

## 2016-01-14 NOTE — Patient Instructions (Addendum)
[facility] Discharge Instructions for Patients Receiving Chemotherapy  Today you received the following chemotherapy agents Taxol.  To help prevent nausea and vomiting after your treatment, we encourage you to take your nausea medication: Compazine 10 mg every 6 hours as needed; Zofran 8 mg every 12 hours as needed.   If you develop nausea and vomiting that is not controlled by your nausea medication, call the clinic.   BELOW ARE SYMPTOMS THAT SHOULD BE REPORTED IMMEDIATELY:  *FEVER GREATER THAN 100.5 F  *CHILLS WITH OR WITHOUT FEVER  NAUSEA AND VOMITING THAT IS NOT CONTROLLED WITH YOUR NAUSEA MEDICATION  *UNUSUAL SHORTNESS OF BREATH  *UNUSUAL BRUISING OR BLEEDING  TENDERNESS IN MOUTH AND THROAT WITH OR WITHOUT PRESENCE OF ULCERS  *URINARY PROBLEMS  *BOWEL PROBLEMS  UNUSUAL RASH Items with * indicate a potential emergency and should be followed up as soon as possible.  Feel free to call the clinic you have any questions or concerns. The clinic phone number is [phone].  Please show the [facility] at check-in to the Emergency Department and triage nurse.

## 2016-01-15 ENCOUNTER — Encounter: Payer: Self-pay | Admitting: *Deleted

## 2016-01-21 ENCOUNTER — Other Ambulatory Visit (HOSPITAL_BASED_OUTPATIENT_CLINIC_OR_DEPARTMENT_OTHER): Payer: 59

## 2016-01-21 ENCOUNTER — Ambulatory Visit (HOSPITAL_BASED_OUTPATIENT_CLINIC_OR_DEPARTMENT_OTHER): Payer: 59

## 2016-01-21 VITALS — BP 138/82 | HR 66 | Temp 98.6°F

## 2016-01-21 DIAGNOSIS — C50311 Malignant neoplasm of lower-inner quadrant of right female breast: Secondary | ICD-10-CM

## 2016-01-21 DIAGNOSIS — Z5111 Encounter for antineoplastic chemotherapy: Secondary | ICD-10-CM

## 2016-01-21 LAB — CBC WITH DIFFERENTIAL/PLATELET
BASO%: 1.3 % (ref 0.0–2.0)
Basophils Absolute: 0.1 10*3/uL (ref 0.0–0.1)
EOS%: 1.3 % (ref 0.0–7.0)
Eosinophils Absolute: 0.1 10*3/uL (ref 0.0–0.5)
HCT: 34.3 % — ABNORMAL LOW (ref 34.8–46.6)
HGB: 11.5 g/dL — ABNORMAL LOW (ref 11.6–15.9)
LYMPH%: 25.9 % (ref 14.0–49.7)
MCH: 30.7 pg (ref 25.1–34.0)
MCHC: 33.5 g/dL (ref 31.5–36.0)
MCV: 91.7 fL (ref 79.5–101.0)
MONO#: 0.5 10*3/uL (ref 0.1–0.9)
MONO%: 5.8 % (ref 0.0–14.0)
NEUT#: 5.3 10*3/uL (ref 1.5–6.5)
NEUT%: 65.7 % (ref 38.4–76.8)
Platelets: 336 10*3/uL (ref 145–400)
RBC: 3.74 10*6/uL (ref 3.70–5.45)
RDW: 19.4 % — ABNORMAL HIGH (ref 11.2–14.5)
WBC: 8.1 10*3/uL (ref 3.9–10.3)
lymph#: 2.1 10*3/uL (ref 0.9–3.3)

## 2016-01-21 LAB — COMPREHENSIVE METABOLIC PANEL
ALT: 13 U/L (ref 0–55)
AST: 15 U/L (ref 5–34)
Albumin: 3.9 g/dL (ref 3.5–5.0)
Alkaline Phosphatase: 67 U/L (ref 40–150)
Anion Gap: 8 mEq/L (ref 3–11)
BUN: 7.6 mg/dL (ref 7.0–26.0)
CO2: 26 mEq/L (ref 22–29)
Calcium: 9.3 mg/dL (ref 8.4–10.4)
Chloride: 106 mEq/L (ref 98–109)
Creatinine: 0.9 mg/dL (ref 0.6–1.1)
EGFR: 87 mL/min/{1.73_m2} — ABNORMAL LOW (ref >90)
Glucose: 135 mg/dl (ref 70–140)
Potassium: 3.9 mEq/L (ref 3.5–5.1)
Sodium: 140 mEq/L (ref 136–145)
Total Bilirubin: <0.3 mg/dL (ref 0.20–1.20)
Total Protein: 6.8 g/dL (ref 6.4–8.3)

## 2016-01-21 MED ORDER — FAMOTIDINE IN NACL 20-0.9 MG/50ML-% IV SOLN
INTRAVENOUS | Status: AC
  Filled 2016-01-21: qty 50

## 2016-01-21 MED ORDER — HEPARIN SOD (PORK) LOCK FLUSH 100 UNIT/ML IV SOLN
500.0000 [IU] | Freq: Once | INTRAVENOUS | Status: AC | PRN
  Administered 2016-01-21: 500 [IU]
  Filled 2016-01-21: qty 5

## 2016-01-21 MED ORDER — OXYCODONE-ACETAMINOPHEN 5-325 MG PO TABS
1.0000 | ORAL_TABLET | Freq: Once | ORAL | Status: DC
Start: 2016-01-21 — End: 2016-01-21

## 2016-01-21 MED ORDER — SODIUM CHLORIDE 0.9 % IV SOLN
4.0000 mg | Freq: Once | INTRAVENOUS | Status: AC
  Administered 2016-01-21: 4 mg via INTRAVENOUS
  Filled 2016-01-21: qty 0.4

## 2016-01-21 MED ORDER — SODIUM CHLORIDE 0.9% FLUSH
10.0000 mL | INTRAVENOUS | Status: DC | PRN
  Administered 2016-01-21: 10 mL
  Filled 2016-01-21: qty 10

## 2016-01-21 MED ORDER — FAMOTIDINE IN NACL 20-0.9 MG/50ML-% IV SOLN
20.0000 mg | Freq: Once | INTRAVENOUS | Status: AC
  Administered 2016-01-21: 20 mg via INTRAVENOUS

## 2016-01-21 MED ORDER — SODIUM CHLORIDE 0.9 % IV SOLN
80.0000 mg/m2 | Freq: Once | INTRAVENOUS | Status: AC
  Administered 2016-01-21: 186 mg via INTRAVENOUS
  Filled 2016-01-21: qty 31

## 2016-01-21 MED ORDER — OXYCODONE-ACETAMINOPHEN 5-325 MG PO TABS
ORAL_TABLET | ORAL | Status: AC
  Filled 2016-01-21: qty 1

## 2016-01-21 MED ORDER — DIPHENHYDRAMINE HCL 50 MG/ML IJ SOLN
INTRAMUSCULAR | Status: AC
  Filled 2016-01-21: qty 1

## 2016-01-21 MED ORDER — DIPHENHYDRAMINE HCL 50 MG/ML IJ SOLN
25.0000 mg | Freq: Once | INTRAMUSCULAR | Status: AC
  Administered 2016-01-21: 25 mg via INTRAVENOUS

## 2016-01-21 MED ORDER — SODIUM CHLORIDE 0.9 % IV SOLN
Freq: Once | INTRAVENOUS | Status: AC
  Administered 2016-01-21: 13:00:00 via INTRAVENOUS

## 2016-01-21 NOTE — Progress Notes (Signed)
Pt refused percocet stating that she will be fine & hasn't taken a percocet in a long time & she was afraid it would make her sick.

## 2016-01-21 NOTE — Patient Instructions (Signed)
[facility] Discharge Instructions for Patients Receiving Chemotherapy  Today you received the following chemotherapy agents Taxol.  To help prevent nausea and vomiting after your treatment, we encourage you to take your nausea medication: Compazine 10 mg every 6 hours as needed; Zofran 8 mg every 12 hours as needed.   If you develop nausea and vomiting that is not controlled by your nausea medication, call the clinic.   BELOW ARE SYMPTOMS THAT SHOULD BE REPORTED IMMEDIATELY:  *FEVER GREATER THAN 100.5 F  *CHILLS WITH OR WITHOUT FEVER  NAUSEA AND VOMITING THAT IS NOT CONTROLLED WITH YOUR NAUSEA MEDICATION  *UNUSUAL SHORTNESS OF BREATH  *UNUSUAL BRUISING OR BLEEDING  TENDERNESS IN MOUTH AND THROAT WITH OR WITHOUT PRESENCE OF ULCERS  *URINARY PROBLEMS  *BOWEL PROBLEMS  UNUSUAL RASH Items with * indicate a potential emergency and should be followed up as soon as possible.  Feel free to call the clinic you have any questions or concerns. The clinic phone number is [phone].  Please show the [facility] at check-in to the Emergency Department and triage nurse.

## 2016-01-28 ENCOUNTER — Other Ambulatory Visit: Payer: Self-pay | Admitting: Oncology

## 2016-01-28 ENCOUNTER — Other Ambulatory Visit (HOSPITAL_BASED_OUTPATIENT_CLINIC_OR_DEPARTMENT_OTHER): Payer: 59

## 2016-01-28 ENCOUNTER — Encounter: Payer: Self-pay | Admitting: *Deleted

## 2016-01-28 ENCOUNTER — Encounter: Payer: Self-pay | Admitting: Nurse Practitioner

## 2016-01-28 ENCOUNTER — Ambulatory Visit (HOSPITAL_BASED_OUTPATIENT_CLINIC_OR_DEPARTMENT_OTHER): Payer: 59

## 2016-01-28 ENCOUNTER — Ambulatory Visit (HOSPITAL_BASED_OUTPATIENT_CLINIC_OR_DEPARTMENT_OTHER): Payer: 59 | Admitting: Nurse Practitioner

## 2016-01-28 VITALS — BP 129/70 | HR 72 | Temp 98.1°F | Resp 18 | Wt 261.3 lb

## 2016-01-28 DIAGNOSIS — J00 Acute nasopharyngitis [common cold]: Secondary | ICD-10-CM

## 2016-01-28 DIAGNOSIS — T451X5A Adverse effect of antineoplastic and immunosuppressive drugs, initial encounter: Secondary | ICD-10-CM

## 2016-01-28 DIAGNOSIS — C50311 Malignant neoplasm of lower-inner quadrant of right female breast: Secondary | ICD-10-CM

## 2016-01-28 DIAGNOSIS — J019 Acute sinusitis, unspecified: Secondary | ICD-10-CM

## 2016-01-28 DIAGNOSIS — Z5111 Encounter for antineoplastic chemotherapy: Secondary | ICD-10-CM | POA: Diagnosis not present

## 2016-01-28 DIAGNOSIS — Z170 Estrogen receptor positive status [ER+]: Secondary | ICD-10-CM

## 2016-01-28 DIAGNOSIS — D6481 Anemia due to antineoplastic chemotherapy: Secondary | ICD-10-CM

## 2016-01-28 LAB — COMPREHENSIVE METABOLIC PANEL
ALT: 16 U/L (ref 0–55)
AST: 15 U/L (ref 5–34)
Albumin: 3.9 g/dL (ref 3.5–5.0)
Alkaline Phosphatase: 58 U/L (ref 40–150)
Anion Gap: 7 mEq/L (ref 3–11)
BUN: 14.9 mg/dL (ref 7.0–26.0)
CO2: 26 mEq/L (ref 22–29)
Calcium: 9.5 mg/dL (ref 8.4–10.4)
Chloride: 108 mEq/L (ref 98–109)
Creatinine: 0.8 mg/dL (ref 0.6–1.1)
EGFR: >90 mL/min/{1.73_m2} (ref >90)
Glucose: 129 mg/dl (ref 70–140)
Potassium: 4.1 mEq/L (ref 3.5–5.1)
Sodium: 142 mEq/L (ref 136–145)
Total Bilirubin: 0.32 mg/dL (ref 0.20–1.20)
Total Protein: 6.8 g/dL (ref 6.4–8.3)

## 2016-01-28 LAB — CBC WITH DIFFERENTIAL/PLATELET
BASO%: 0.7 % (ref 0.0–2.0)
Basophils Absolute: 0 10*3/uL (ref 0.0–0.1)
EOS%: 0.1 % (ref 0.0–7.0)
Eosinophils Absolute: 0 10*3/uL (ref 0.0–0.5)
HCT: 34.1 % — ABNORMAL LOW (ref 34.8–46.6)
HGB: 11.2 g/dL — ABNORMAL LOW (ref 11.6–15.9)
LYMPH%: 15.1 % (ref 14.0–49.7)
MCH: 30.6 pg (ref 25.1–34.0)
MCHC: 32.8 g/dL (ref 31.5–36.0)
MCV: 93.2 fL (ref 79.5–101.0)
MONO#: 0.2 10*3/uL (ref 0.1–0.9)
MONO%: 2.9 % (ref 0.0–14.0)
NEUT#: 5.4 10*3/uL (ref 1.5–6.5)
NEUT%: 81.2 % — ABNORMAL HIGH (ref 38.4–76.8)
Platelets: 315 10*3/uL (ref 145–400)
RBC: 3.66 10*6/uL — ABNORMAL LOW (ref 3.70–5.45)
RDW: 18.9 % — ABNORMAL HIGH (ref 11.2–14.5)
WBC: 6.6 10*3/uL (ref 3.9–10.3)
lymph#: 1 10*3/uL (ref 0.9–3.3)

## 2016-01-28 MED ORDER — HEPARIN SOD (PORK) LOCK FLUSH 100 UNIT/ML IV SOLN
500.0000 [IU] | Freq: Once | INTRAVENOUS | Status: AC | PRN
  Administered 2016-01-28: 500 [IU]
  Filled 2016-01-28: qty 5

## 2016-01-28 MED ORDER — FAMOTIDINE IN NACL 20-0.9 MG/50ML-% IV SOLN
INTRAVENOUS | Status: AC
  Filled 2016-01-28: qty 50

## 2016-01-28 MED ORDER — SODIUM CHLORIDE 0.9 % IV SOLN
4.0000 mg | Freq: Once | INTRAVENOUS | Status: AC
  Administered 2016-01-28: 4 mg via INTRAVENOUS
  Filled 2016-01-28: qty 0.4

## 2016-01-28 MED ORDER — DIPHENHYDRAMINE HCL 50 MG/ML IJ SOLN
25.0000 mg | Freq: Once | INTRAMUSCULAR | Status: AC
  Administered 2016-01-28: 25 mg via INTRAVENOUS

## 2016-01-28 MED ORDER — FAMOTIDINE IN NACL 20-0.9 MG/50ML-% IV SOLN
20.0000 mg | Freq: Once | INTRAVENOUS | Status: AC
  Administered 2016-01-28: 20 mg via INTRAVENOUS

## 2016-01-28 MED ORDER — SODIUM CHLORIDE 0.9 % IV SOLN
80.0000 mg/m2 | Freq: Once | INTRAVENOUS | Status: AC
  Administered 2016-01-28: 186 mg via INTRAVENOUS
  Filled 2016-01-28: qty 31

## 2016-01-28 MED ORDER — DIPHENHYDRAMINE HCL 50 MG/ML IJ SOLN
INTRAMUSCULAR | Status: AC
  Filled 2016-01-28: qty 1

## 2016-01-28 MED ORDER — SODIUM CHLORIDE 0.9 % IV SOLN
Freq: Once | INTRAVENOUS | Status: AC
  Administered 2016-01-28: 12:00:00 via INTRAVENOUS

## 2016-01-28 MED ORDER — SODIUM CHLORIDE 0.9% FLUSH
10.0000 mL | INTRAVENOUS | Status: DC | PRN
  Administered 2016-01-28: 10 mL
  Filled 2016-01-28: qty 10

## 2016-01-28 NOTE — Patient Instructions (Signed)
[facility] Discharge Instructions for Patients Receiving Chemotherapy  Today you received the following chemotherapy agents:  Taxol  To help prevent nausea and vomiting after your treatment, we encourage you to take your nausea medication as prescribed.   If you develop nausea and vomiting that is not controlled by your nausea medication, call the clinic.   BELOW ARE SYMPTOMS THAT SHOULD BE REPORTED IMMEDIATELY:  *FEVER GREATER THAN 100.5 F  *CHILLS WITH OR WITHOUT FEVER  NAUSEA AND VOMITING THAT IS NOT CONTROLLED WITH YOUR NAUSEA MEDICATION  *UNUSUAL SHORTNESS OF BREATH  *UNUSUAL BRUISING OR BLEEDING  TENDERNESS IN MOUTH AND THROAT WITH OR WITHOUT PRESENCE OF ULCERS  *URINARY PROBLEMS  *BOWEL PROBLEMS  UNUSUAL RASH Items with * indicate a potential emergency and should be followed up as soon as possible.  Feel free to call the clinic you have any questions or concerns. The clinic phone number is [phone].  Please show the CHEMO ALERT CARD at check-in to the Emergency Department and triage nurse.   

## 2016-01-28 NOTE — Progress Notes (Signed)
[name]  [phone]) [phone] [phone]   ID: [name] DOB: Feb 11, 1961  MR#: [id number]  JYN#:[id number]  Patient Care Team: Laurey Morale, MD as PCP - [place] III, MD as Consulting Physician (General Surgery) Chauncey Cruel, MD as Consulting Physician (Oncology) Thea Silversmith, MD as Consulting Physician (Radiation Oncology) Sylvan Cheese, NP as Nurse Practitioner (Hematology and Oncology) PCP: Laurey Morale, MD GYN: OTHER MD: Mardi Mainland MD, Nicholaus Bloom MD  CHIEF COMPLAINT: Multicentric breast cancer  CURRENT TREATMENT: Adjuvant chemotherapy ongoing  BREAST CANCER HISTORY: From the original intake note:  [name] herself noted a change in her right breast in October and brought it to the attention of her primary care physician. Her last mammogram had been April 2010. Dr. Sarajane Jews set [name] up for bilateral diagnostic mammography with tomosynthesis and [facility] 04/11/2015. This found the breast density to be category B. In the right breast there were 3 microlobulated masses in the lower inner quadrant measuring 1.9, 1.5, and 1.5 cm. These were multicentric. There were also diffuse fine pleomorphic calcifications surrounding these masses, that area spanning 11.6 cm. The masses were palpable by exam at 4:00 8 cm from the nipple and at 5:00 3 cm from the nipple. By ultrasonography there was an irregular hypoechoic mass in the right breast at 4:00 measuring 1.7 cm, 1 medial to this measuring 1.1 cm, and then the third irregular hypoechoic mass at the 5:00 position measuring 1.3 cm. The right axilla showed multiple lymph nodes with thickened cortices. The largest lymph node measured 0.9 cm.  On 08/02/2015, [name] underwent biopsy of all 3 breast masses as well as a right axillary lymph node. 2 of the tumors were similar invasive ductal carcinomas, grade 2, estrogen receptor 95%  positive, progesterone receptor 5% positive, with an MIB-1 of 10%, and HER-2 equivocal, with a signals ratio of 1.30 and the number per cell 4.0. The third tumor appeared's grade 1 or 2 was also estrogen receptor positive at 95%, progesterone receptor positive at 5%, with an MIP-1 of 5%, and a similar HER-2 profile the signals ratio being 1.46 but the number per cell being only 3.95. By immunohistochemistry on this tumor HER-2 was negative at 1+. Immunohistochemistry on the equivocal reading is pending.  [name]'s subsequent history is as detailed below  INTERVAL HISTORY: [name] returns today for follow-up of her estrogen receptor positive breast cancer, accompanied by her husband. Today she is due for cycle 4 of 12 weekly planned doses of paclitaxel.   REVIEW OF SYSTEMS: Znya is battling a cold this week. Starting last Wednesday she developed a cold with yellow, and occasionally green, mucus. 3 days later her throat began to get irritated, and now her eyes are watery and nose is running. She has a history of allergies. She has not been taking anything OTC because she was not sure what was allowed while on chemotherapy. The drainage down the back of her throat is making her nauseous. She denies fevers or chills. She is moving her bowels well. Her appetite is fair secondary to taste changes. She is having more lower back pain than usual, and blames this on the weather. She is prescribed narcotics for pain through a pain clinic. She ambulates with a cane. She takes 357m gabapentin for hot flashes. This also helps her sleep. A detailed review of systems is otherwise stable.  PAST MEDICAL HISTORY: Past Medical History  Diagnosis Date  . Osteoarthritis   .  Headache(784.0)     migraine like per patient  . Hypertension   . Low back pain     dr phillips, pain management  . Degenerative joint disease   . Gallstones   . GERD (gastroesophageal reflux disease)   . DDD (degenerative disc disease),  cervical   . Colon polyps   . Diverticulosis   . Diabetes mellitus without complication ([place])   . Breast cancer of lower-inner quadrant of right female breast ([place]) 08/08/2015  . Breast cancer ([place])     PAST SURGICAL HISTORY: Past Surgical History  Procedure Laterality Date  . Replacement total knee Right     right - dr Novella Olive  . Ganglion cyst excision Right     right wrist  . Lipoma excision Right 02/21/08    right hip, [facility]  . Replacement total knee Left 06/25/09    left - dr Marciano Sequin [facility]  . Tonsillectomy    . Joint replacement      both knees have been done, four times each per patient. Her body rejects the implant, no infection.  . Abdominal hysterectomy  06/22/06    lap, dr Mancel Bale  . Other surgical history      lipoma removed from back 2012   . Cholecystectomy  10/28/2011    Procedure: LAPAROSCOPIC CHOLECYSTECTOMY WITH INTRAOPERATIVE CHOLANGIOGRAM;  Surgeon: Earnstine Regal, MD;  Location: [facility];  Service: General;  Laterality: N/A;  . Mass excision  08/24/2012    Procedure: EXCISION MASS;  Surgeon: Earnstine Regal, MD;  Location: [facility];  Service: General;  Laterality: Left;  excisie soft tissue masses left shoulder(back) & left upper arm  . Colonoscopy  02-03-13    per Dr. Henrene Pastor, diverticuli but no polyps, repeat in 5 yrs   . Mass excision Left 01/24/2014    Procedure: EXCISION SOFT TISSUE MASSES LOWER LEFT BACK;  Surgeon: Earnstine Regal, MD;  Location: [facility];  Service: General;  Laterality: Left;  . Esophagogastroduodenoscopy  02-03-13    per Dr. Henrene Pastor, normal   . Cardiac catheterization      back in 200 by Dr. Stanford Breed  . Tonsillectomy    . Mastectomy w/ sentinel node biopsy Right 10/02/2015  . Mastectomy w/ sentinel node biopsy Right 10/01/2015    Procedure: RIGHT MASTECTOMY WITH SENTINEL LYMPH NODE BIOPSY;  Surgeon: Autumn Messing III, MD;  Location: [facility];  Service: General;  Laterality: Right;  . Portacath placement Left 11/01/2015     Procedure: INSERTION PORT-A-CATH;  Surgeon: Autumn Messing III, MD;  Location: [facility];  Service: General;  Laterality: Left;    FAMILY HISTORY Family History  Problem Relation Age of Onset  . Crohn's disease Other     nephew  . Stomach cancer Mother   . Heart disease Father   . Prostate cancer Father   . Kidney cancer Sister     one out of the four  . Hypertension Sister   . Hypertension Brother   . Colon cancer Maternal Grandmother   The patient's father died at the age of 74 with congestive heart failure. Her mother is still living as of November 2016, age 32. The patient had 6 brothers, 4 sisters. One sister was diagnosed with kidney cancer at age 71. ([place]). She is doing well. The patient's maternal grandmother was diagnosed with colon cancer at the age of 78. The patient's mother was diagnosed with a "rare stomach cancer" 20 years ago. The patient's father was diagnosed  with prostate cancer at age 75. There is no history of breast or ovarian cancer in the family to her knowledge.   GYNECOLOGIC HISTORY:  No LMP recorded. Patient has had a hysterectomy.  menarche age 44, first live birth age 50, the patient is GX P3. She underwent remote hysterectomy with bilateral salpingo-oophorectomy. She did not use hormone replacement. She never used oral contraceptives.   SOCIAL HISTORY:  Sola used to work as a "[employer] but she is now disabled because of her multiple arthritic and degenerative problems. Her husband Elberta Fortis A. [place] works as a Freight forwarder. Son Dalphine Handing works in long care in [place]. Son Evette Doffing works for a Arboriculturist in [place]. Daughter  Silva Bandy is a Haematologist in [place]. The patient has 5 grandchildren. She attends a Physicist, medical church    ADVANCED DIRECTIVES: Not in place   HEALTH MAINTENANCE: Social History  Substance Use Topics  . Smoking status: Current Every Day Smoker -- 0.25  packs/day for 20 years    Types: Cigarettes  . Smokeless tobacco: Former Systems developer    Quit date: 09/30/2015     Comment: quit  . Alcohol Use: No     Colonoscopy:February 2016   TDD:[place] post hysterectomy   Bone density:  Lipid panel:  Allergies  Allergen Reactions  . Codeine Itching and Nausea And Vomiting    Itching all over the body  . Latex Hives  . Sulfonamide Derivatives Hives    All over the body    Current Outpatient Prescriptions  Medication Sig Dispense Refill  . amitriptyline (ELAVIL) 25 MG tablet Take 25 mg by mouth at bedtime.     Marland Kitchen amLODipine (NORVASC) 5 MG tablet Take 1 tablet (5 mg total) by mouth 2 (two) times daily. 180 tablet 3  . furosemide (LASIX) 20 MG tablet TAKE 1 TABLET BY MOUTH EVERY DAY 30 tablet 6  . gabapentin (NEURONTIN) 300 MG capsule Take 1 capsule (300 mg total) by mouth at bedtime. 90 capsule 4  . lidocaine-prilocaine (EMLA) cream Apply 1 application topically as needed. 30 g 0  . lisinopril (PRINIVIL,ZESTRIL) 10 MG tablet Take 1 tablet (10 mg total) by mouth daily. 90 tablet 3  . metFORMIN (GLUCOPHAGE) 500 MG tablet Take 1 tablet (500 mg total) by mouth 2 (two) times daily with a meal. 180 tablet 3  . methadone (DOLOPHINE) 10 MG tablet Take 10 mg by mouth every 8 (eight) hours as needed for moderate pain.     . metoprolol (LOPRESSOR) 50 MG tablet Take 100 mg by mouth 2 (two) times daily.    Marland Kitchen omeprazole (PRILOSEC) 40 MG capsule Take 1 capsule (40 mg total) by mouth daily. 90 capsule 3  . oxyCODONE (ROXICODONE) 15 MG immediate release tablet Take 15 mg by mouth every 6 (six) hours as needed for pain. Reported on 12/10/2015    . potassium chloride (K-DUR) 10 MEQ tablet Take 3 tablets (30 mEq total) by mouth 2 (two) times daily. (Patient taking differently: Take 20 mEq by mouth 2 (two) times daily. ) 180 tablet 11  . prochlorperazine (COMPAZINE) 10 MG tablet TK 1 T PO Q 6 H PRN NV  1  . temazepam (RESTORIL) 30 MG capsule TAKE ONE CAPSULE BY MOUTH AT  BEDTIME AS NEEDED FOR SLEEP (Patient taking differently: TAKE ONE CAPSULE BY MOUTH AT BEDTIME) 90 capsule 1  . fluconazole (DIFLUCAN) 150 MG tablet Take 1 tablet (150 mg total) by mouth once. Can repeat dose x1 in 3 days. (  Patient not taking: Reported on 01/28/2016) 2 tablet 0  . ondansetron (ZOFRAN) 8 MG tablet Reported on 01/28/2016  1   No current facility-administered medications for this visit.    OBJECTIVE: middle-aged African-American woman Who appears stated age  55 Vitals:   01/28/16 1129  BP: 129/70  Pulse: 72  Temp: 98.1 F (36.7 C)  Resp: 18     Body mass index is 40.92 kg/(m^2).    ECOG FS:2 - Symptomatic, <50% confined to bed  Sclerae unicteric, pupils round and equal Oropharynx clear and moist-- no thrush or other lesions No cervical or supraclavicular adenopathy Lungs no rales or rhonchi Heart regular rate and rhythm Abd soft, nontender, positive bowel sounds MSK no focal spinal tenderness, no upper extremity lymphedema Neuro: nonfocal, well oriented, appropriate affect Breasts: deferred  LAB RESULTS:  CMP     Component Value Date/Time   NA 142 01/28/2016 1109   NA 140 01/09/2016 1841   K 4.1 01/28/2016 1109   K 2.7* 01/09/2016 1841   CL 104 01/09/2016 1841   CO2 26 01/28/2016 1109   CO2 24 01/09/2016 1841   GLUCOSE 129 01/28/2016 1109   GLUCOSE 144* 01/09/2016 1841   BUN 14.9 01/28/2016 1109   BUN 15 01/09/2016 1841   CREATININE 0.8 01/28/2016 1109   CREATININE 0.74 01/09/2016 1841   CALCIUM 9.5 01/28/2016 1109   CALCIUM 9.1 01/09/2016 1841   PROT 6.8 01/28/2016 1109   PROT 7.7 06/05/2015 1837   ALBUMIN 3.9 01/28/2016 1109   ALBUMIN 4.8 06/05/2015 1837   AST 15 01/28/2016 1109   AST 24 06/05/2015 1837   ALT 16 01/28/2016 1109   ALT 14 06/05/2015 1837   ALKPHOS 58 01/28/2016 1109   ALKPHOS 69 06/05/2015 1837   BILITOT 0.32 01/28/2016 1109   BILITOT 0.5 06/05/2015 1837   GFRNONAA >60 01/09/2016 1841   GFRAA >60 01/09/2016 1841    INo  results found for: SPEP, UPEP  Lab Results  Component Value Date   WBC 6.6 01/28/2016   NEUTROABS 5.4 01/28/2016   HGB 11.2* 01/28/2016   HCT 34.1* 01/28/2016   MCV 93.2 01/28/2016   PLT 315 01/28/2016      Chemistry      Component Value Date/Time   NA 142 01/28/2016 1109   NA 140 01/09/2016 1841   K 4.1 01/28/2016 1109   K 2.7* 01/09/2016 1841   CL 104 01/09/2016 1841   CO2 26 01/28/2016 1109   CO2 24 01/09/2016 1841   BUN 14.9 01/28/2016 1109   BUN 15 01/09/2016 1841   CREATININE 0.8 01/28/2016 1109   CREATININE 0.74 01/09/2016 1841      Component Value Date/Time   CALCIUM 9.5 01/28/2016 1109   CALCIUM 9.1 01/09/2016 1841   ALKPHOS 58 01/28/2016 1109   ALKPHOS 69 06/05/2015 1837   AST 15 01/28/2016 1109   AST 24 06/05/2015 1837   ALT 16 01/28/2016 1109   ALT 14 06/05/2015 1837   BILITOT 0.32 01/28/2016 1109   BILITOT 0.5 06/05/2015 1837       No results found for: LABCA2  No components found for: LABCA125  No results for input(s): INR in the last 168 hours.  Urinalysis    Component Value Date/Time   COLORURINE YELLOW 01/09/2016 1948   APPEARANCEUR CLEAR 01/09/2016 1948   LABSPEC 1.020 01/09/2016 1948   PHURINE 6.0 01/09/2016 1948   GLUCOSEU NEGATIVE 01/09/2016 1948   HGBUR TRACE* 01/09/2016 1948   HGBUR large 07/01/2010 [place]  NEGATIVE 01/09/2016 1948   BILIRUBINUR n 02/26/2015 [place] 01/09/2016 1948   PROTEINUR NEGATIVE 01/09/2016 1948   PROTEINUR n 02/26/2015 1121   UROBILINOGEN 0.2 06/05/2015 2353   UROBILINOGEN 0.2 02/26/2015 1121   NITRITE NEGATIVE 01/09/2016 1948   NITRITE n 02/26/2015 1121   LEUKOCYTESUR MODERATE* 01/09/2016 1948   STUDIES: No results found.  ASSESSMENT: 55 y.o. [place] woman status post right breast biopsy 3 and axillary lymph node biopsy 08/02/2015 for a clinical mpT1c N0, s invasive ductal carcinoma, grade 1 or 2, estrogen receptor 95% positive, progesterone receptor 5% positive,  with an MIB-1 between 5 and 10%, and HER-2 equivocal on 1 of the 2 biopsies (signals ratio 1.30, number per cell 4.0).tage IA   (1) status post right modified radical mastectomy 10/01/2015 for an mpT2 pN0, stage IIA invasive ductal carcinoma, grade 2, with a total of 20 benign lymph nodes removed  (2) Oncotype DX score of 32 predicts an outside the breast recurrence risk of 22% within 10 years if the patient's only systemic therapy is tamoxifen for 5 years. It also predicts significant benefit from adjuvant chemotherapy.  (3) patient completed adjuvant doxorubicin and cyclophosphamide in dose dense fashion 4, last dose 12/24/2015, to be followed as tolerated by paclitaxel weekly 12 Starting 01/07/2016   (4) postmastectomy radiation to follow   (5) anti-estrogens to follow completion of local treatment  (a) consider PALLAS  Study  (b) consider BWEL study  PLAN: Malonie continues to tolerate treatment well with no toxicities that she is aware of. The labs were reviewed in detail and were sufficient for treatment. She is just borderline anemic. She will proceed with cycle 4 of paclitaxel as planned today.   For her sinus symptoms she is going to take claritin daily and mucinex BID. She will call later in the week if this is has not begun to improve. She may have an actual sinus infection requiring antibiotics.   [name] will return in 1 week for cycle 5 of treatment. She understands and agrees with this plan. She knows the goal of treatment in her case is cure. She has been encouraged to call with any issues that might arise before her next visit here.    Laurie Panda, NP   01/28/2016 11:49 AM

## 2016-02-04 ENCOUNTER — Ambulatory Visit (HOSPITAL_BASED_OUTPATIENT_CLINIC_OR_DEPARTMENT_OTHER)
Admission: RE | Admit: 2016-02-04 | Discharge: 2016-02-04 | Disposition: A | Discharge status: Home / Self Care | Payer: 59 | Source: Ambulatory Visit | Attending: Internal Medicine | Admitting: Internal Medicine

## 2016-02-04 ENCOUNTER — Other Ambulatory Visit (HOSPITAL_BASED_OUTPATIENT_CLINIC_OR_DEPARTMENT_OTHER): Payer: 59

## 2016-02-04 ENCOUNTER — Telehealth: Payer: Self-pay | Admitting: Nurse Practitioner

## 2016-02-04 ENCOUNTER — Ambulatory Visit (HOSPITAL_BASED_OUTPATIENT_CLINIC_OR_DEPARTMENT_OTHER): Payer: 59

## 2016-02-04 ENCOUNTER — Ambulatory Visit (HOSPITAL_COMMUNITY)
Admission: RE | Admit: 2016-02-04 | Discharge: 2016-02-04 | Disposition: A | Discharge status: Home / Self Care | Payer: 59 | Source: Ambulatory Visit | Attending: Family Medicine | Admitting: Family Medicine

## 2016-02-04 ENCOUNTER — Encounter (HOSPITAL_COMMUNITY): Payer: Self-pay | Admitting: Internal Medicine

## 2016-02-04 ENCOUNTER — Encounter: Payer: Self-pay | Admitting: *Deleted

## 2016-02-04 ENCOUNTER — Ambulatory Visit (HOSPITAL_BASED_OUTPATIENT_CLINIC_OR_DEPARTMENT_OTHER): Payer: 59 | Admitting: Nurse Practitioner

## 2016-02-04 ENCOUNTER — Encounter: Payer: Self-pay | Admitting: Nurse Practitioner

## 2016-02-04 VITALS — BP 140/80 | HR 89 | Wt 260.2 lb

## 2016-02-04 VITALS — BP 130/85 | HR 75 | Temp 98.4°F | Resp 19 | Wt 260.8 lb

## 2016-02-04 VITALS — BP 137/78 | HR 69 | Temp 98.2°F | Resp 20

## 2016-02-04 DIAGNOSIS — C50311 Malignant neoplasm of lower-inner quadrant of right female breast: Secondary | ICD-10-CM

## 2016-02-04 DIAGNOSIS — Z7984 Long term (current) use of oral hypoglycemic drugs: Secondary | ICD-10-CM | POA: Diagnosis not present

## 2016-02-04 DIAGNOSIS — T451X5A Adverse effect of antineoplastic and immunosuppressive drugs, initial encounter: Secondary | ICD-10-CM

## 2016-02-04 DIAGNOSIS — R5383 Other fatigue: Secondary | ICD-10-CM

## 2016-02-04 DIAGNOSIS — K219 Gastro-esophageal reflux disease without esophagitis: Secondary | ICD-10-CM | POA: Diagnosis not present

## 2016-02-04 DIAGNOSIS — E119 Type 2 diabetes mellitus without complications: Secondary | ICD-10-CM | POA: Diagnosis not present

## 2016-02-04 DIAGNOSIS — I119 Hypertensive heart disease without heart failure: Secondary | ICD-10-CM | POA: Insufficient documentation

## 2016-02-04 DIAGNOSIS — F17210 Nicotine dependence, cigarettes, uncomplicated: Secondary | ICD-10-CM | POA: Insufficient documentation

## 2016-02-04 DIAGNOSIS — Z79899 Other long term (current) drug therapy: Secondary | ICD-10-CM | POA: Insufficient documentation

## 2016-02-04 DIAGNOSIS — D6481 Anemia due to antineoplastic chemotherapy: Secondary | ICD-10-CM

## 2016-02-04 DIAGNOSIS — Z5111 Encounter for antineoplastic chemotherapy: Secondary | ICD-10-CM

## 2016-02-04 DIAGNOSIS — Z170 Estrogen receptor positive status [ER+]: Secondary | ICD-10-CM

## 2016-02-04 LAB — COMPREHENSIVE METABOLIC PANEL
ALT: 15 U/L (ref 0–55)
AST: 15 U/L (ref 5–34)
Albumin: 3.9 g/dL (ref 3.5–5.0)
Alkaline Phosphatase: 55 U/L (ref 40–150)
Anion Gap: 8 mEq/L (ref 3–11)
BUN: 9.9 mg/dL (ref 7.0–26.0)
CO2: 29 mEq/L (ref 22–29)
Calcium: 9.3 mg/dL (ref 8.4–10.4)
Chloride: 106 mEq/L (ref 98–109)
Creatinine: 0.8 mg/dL (ref 0.6–1.1)
EGFR: >90 mL/min/{1.73_m2} (ref >90)
Glucose: 106 mg/dl (ref 70–140)
Potassium: 3.4 mEq/L — ABNORMAL LOW (ref 3.5–5.1)
Sodium: 143 mEq/L (ref 136–145)
Total Bilirubin: <0.3 mg/dL (ref 0.20–1.20)
Total Protein: 6.7 g/dL (ref 6.4–8.3)

## 2016-02-04 LAB — CBC WITH DIFFERENTIAL/PLATELET
BASO%: 0.3 % (ref 0.0–2.0)
Basophils Absolute: 0 10*3/uL (ref 0.0–0.1)
EOS%: 3 % (ref 0.0–7.0)
Eosinophils Absolute: 0.2 10*3/uL (ref 0.0–0.5)
HCT: 34.2 % — ABNORMAL LOW (ref 34.8–46.6)
HGB: 11.2 g/dL — ABNORMAL LOW (ref 11.6–15.9)
LYMPH%: 28.1 % (ref 14.0–49.7)
MCH: 30.9 pg (ref 25.1–34.0)
MCHC: 32.7 g/dL (ref 31.5–36.0)
MCV: 94.5 fL (ref 79.5–101.0)
MONO#: 0.3 10*3/uL (ref 0.1–0.9)
MONO%: 5 % (ref 0.0–14.0)
NEUT#: 4.1 10*3/uL (ref 1.5–6.5)
NEUT%: 63.6 % (ref 38.4–76.8)
Platelets: 255 10*3/uL (ref 145–400)
RBC: 3.62 10*6/uL — ABNORMAL LOW (ref 3.70–5.45)
RDW: 17.2 % — ABNORMAL HIGH (ref 11.2–14.5)
WBC: 6.4 10*3/uL (ref 3.9–10.3)
lymph#: 1.8 10*3/uL (ref 0.9–3.3)

## 2016-02-04 LAB — ECHOCARDIOGRAM COMPLETE: Weight: 4172.8 oz

## 2016-02-04 MED ORDER — FAMOTIDINE IN NACL 20-0.9 MG/50ML-% IV SOLN
INTRAVENOUS | Status: AC
  Filled 2016-02-04: qty 50

## 2016-02-04 MED ORDER — SODIUM CHLORIDE 0.9 % IV SOLN
80.0000 mg/m2 | Freq: Once | INTRAVENOUS | Status: AC
  Administered 2016-02-04: 186 mg via INTRAVENOUS
  Filled 2016-02-04: qty 31

## 2016-02-04 MED ORDER — DIPHENHYDRAMINE HCL 50 MG/ML IJ SOLN
25.0000 mg | Freq: Once | INTRAMUSCULAR | Status: AC
  Administered 2016-02-04: 25 mg via INTRAVENOUS

## 2016-02-04 MED ORDER — SODIUM CHLORIDE 0.9 % IV SOLN
4.0000 mg | Freq: Once | INTRAVENOUS | Status: AC
  Administered 2016-02-04: 4 mg via INTRAVENOUS
  Filled 2016-02-04: qty 0.4

## 2016-02-04 MED ORDER — DIPHENHYDRAMINE HCL 50 MG/ML IJ SOLN
INTRAMUSCULAR | Status: AC
  Filled 2016-02-04: qty 1

## 2016-02-04 MED ORDER — HEPARIN SOD (PORK) LOCK FLUSH 100 UNIT/ML IV SOLN
500.0000 [IU] | Freq: Once | INTRAVENOUS | Status: AC | PRN
  Administered 2016-02-04: 500 [IU]
  Filled 2016-02-04: qty 5

## 2016-02-04 MED ORDER — SODIUM CHLORIDE 0.9% FLUSH
10.0000 mL | INTRAVENOUS | Status: DC | PRN
  Administered 2016-02-04: 10 mL
  Filled 2016-02-04: qty 10

## 2016-02-04 MED ORDER — FAMOTIDINE IN NACL 20-0.9 MG/50ML-% IV SOLN
20.0000 mg | Freq: Once | INTRAVENOUS | Status: AC
  Administered 2016-02-04: 20 mg via INTRAVENOUS

## 2016-02-04 MED ORDER — SODIUM CHLORIDE 0.9 % IV SOLN
Freq: Once | INTRAVENOUS | Status: AC
  Administered 2016-02-04: 10:00:00 via INTRAVENOUS

## 2016-02-04 NOTE — Telephone Encounter (Signed)
appt made and avs printed °

## 2016-02-04 NOTE — Progress Notes (Signed)
  Echocardiogram 2D Echocardiogram has been performed.  [name] 02/04/2016, 2:02 PM

## 2016-02-04 NOTE — Patient Instructions (Signed)
Follow up and echo with Dr.Bensimhon in 3 months.  

## 2016-02-04 NOTE — Patient Instructions (Signed)
[facility] Discharge Instructions for Patients Receiving Chemotherapy  Today you received the following chemotherapy agents:  Taxol  To help prevent nausea and vomiting after your treatment, we encourage you to take your nausea medication as prescribed.   If you develop nausea and vomiting that is not controlled by your nausea medication, call the clinic.   BELOW ARE SYMPTOMS THAT SHOULD BE REPORTED IMMEDIATELY:  *FEVER GREATER THAN 100.5 F  *CHILLS WITH OR WITHOUT FEVER  NAUSEA AND VOMITING THAT IS NOT CONTROLLED WITH YOUR NAUSEA MEDICATION  *UNUSUAL SHORTNESS OF BREATH  *UNUSUAL BRUISING OR BLEEDING  TENDERNESS IN MOUTH AND THROAT WITH OR WITHOUT PRESENCE OF ULCERS  *URINARY PROBLEMS  *BOWEL PROBLEMS  UNUSUAL RASH Items with * indicate a potential emergency and should be followed up as soon as possible.  Feel free to call the clinic you have any questions or concerns. The clinic phone number is [phone].  Please show the CHEMO ALERT CARD at check-in to the Emergency Department and triage nurse.   

## 2016-02-04 NOTE — Progress Notes (Signed)
Advanced Heart Failure Medication Review by a Pharmacist  Does the patient  feel that his/her medications are working for him/her?  yes  Has the patient been experiencing any side effects to the medications prescribed?  Dry eyes worsened on low dose amytriptyline, dry cough on lisinopril  Does the patient measure his/her own blood pressure or blood glucose at home?  yes   Does the patient have any problems obtaining medications due to transportation or finances?   no  Understanding of regimen: good Understanding of indications: good Potential of compliance: excellent Patient understands to avoid NSAIDs. Patient understands to avoid decongestants.  Issues to address at subsequent visits: none   Pharmacist comments: [name] is a pleasant 55 yo woman here for f/u in HF clinic. Received 5th cycle of paclitaxel treatment (weekly tx for 12 cycles) this AM. Had recently endorsed difficulty sleeping during one of her previous oncology clinic visits, now no amitryptyline and temazepam prn. Did not mention any concerns of insomnia today.  Good glucose control with metformin (CBGs 120s), on lisinopril with dry cough however it comes and goes. Does not bother or limit pt. Denies any swelling on Lasix. No other issues at this time.  [name], [place].D., BCPS PGY2 Cardiology Pharmacy Resident Pager: [phone]    Time with patient: 10 min Preparation and documentation time: 5 min Total time: 15 min

## 2016-02-04 NOTE — Progress Notes (Signed)
[place]  [phone] [phone]) [phone]   ID: [name] DOB: December 23, 1960  MR#: [id number]  OAC#:[id number]  Patient Care Team: Laurey Morale, MD as PCP - [place] III, MD as Consulting Physician (General Surgery) Chauncey Cruel, MD as Consulting Physician (Oncology) Thea Silversmith, MD as Consulting Physician (Radiation Oncology) Sylvan Cheese, NP as Nurse Practitioner (Hematology and Oncology) PCP: Laurey Morale, MD GYN: OTHER MD: Mardi Mainland MD, Nicholaus Bloom MD  CHIEF COMPLAINT: Multicentric breast cancer  CURRENT TREATMENT: Adjuvant chemotherapy ongoing  BREAST CANCER HISTORY: From the original intake note:  [name] herself noted a change in her right breast in October and brought it to the attention of her primary care physician. Her last mammogram had been April 2010. Dr. Sarajane Jews set [name] up for bilateral diagnostic mammography with tomosynthesis and [facility] 04[facility]11/2015. This found the breast density to be category B. In the right breast there were 3 microlobulated masses in the lower inner quadrant measuring 1.9, 1.5, and 1.5 cm. These were multicentric. There were also diffuse fine pleomorphic calcifications surrounding these masses, that area spanning 11.6 cm. The masses were palpable by exam at 4:00 8 cm from the nipple and at 5:00 3 cm from the nipple. By ultrasonography there was an irregular hypoechoic mass in the right breast at 4:00 measuring 1.7 cm, 1 medial to this measuring 1.1 cm, and then the third irregular hypoechoic mass at the 5:00 position measuring 1.3 cm. The right axilla showed multiple lymph nodes with thickened cortices. The largest lymph node measured 0.9 cm.  On 08/02/2015, [name] underwent biopsy of all 3 breast masses as well as a right axillary lymph node. 2 of the tumors were similar invasive ductal carcinomas, grade 2, estrogen receptor 95%  positive, progesterone receptor 5% positive, with an MIB-1 of 10%, and HER-2 equivocal, with a signals ratio of 1.30 and the number per cell 4.0. The third tumor appeared's grade 1 or 2 was also estrogen receptor positive at 95%, progesterone receptor positive at 5%, with an MIP-1 of 5%, and a similar HER-2 profile the signals ratio being 1.46 but the number per cell being only 3.95. By immunohistochemistry on this tumor HER-2 was negative at 1+. Immunohistochemistry on the equivocal reading is pending.  [name]'s subsequent history is as detailed below  INTERVAL HISTORY: [name] returns today for follow-up of her estrogen receptor positive breast cancer, accompanied by her daughter. Today she is due for cycle 5 of 12 weekly planned doses of paclitaxel.   REVIEW OF SYSTEMS: [name]'s sinus symptoms are clearing up. She never required the antibiotic. She denies fevers or chills. Her nausea is controlled with compazine. Her appetite is good. She is moving her bowels well. She denies mouth sores, rashes, or neuropathy symptoms. Her back pain has improved now that the weather has cleared. She is still having difficulty sleeping. She takes 340m gabapentin and restoril QHS. A detailed review of systems is otherwise stable.  PAST MEDICAL HISTORY: Past Medical History  Diagnosis Date  . Osteoarthritis   . Headache(784.0)     migraine like per patient  . Hypertension   . Low back pain     dr phillips, pain management  . Degenerative joint disease   . Gallstones   . GERD (gastroesophageal reflux disease)   . DDD (degenerative disc disease), cervical   . Colon polyps   . Diverticulosis   . Diabetes mellitus without complication ([place]   .  Breast cancer of lower-inner quadrant of right female breast ([place]) 08/08/2015  . Breast cancer ([place])     PAST SURGICAL HISTORY: Past Surgical History  Procedure Laterality Date  . Replacement total knee Right     right - dr Novella Olive  . Ganglion cyst excision  Right     right wrist  . Lipoma excision Right 02/21/08    right hip, [facility]  . Replacement total knee Left 06/25/09    left - dr Marciano Sequin [facility]  . Tonsillectomy    . Joint replacement      both knees have been done, four times each per patient. Her body rejects the implant, no infection.  . Abdominal hysterectomy  06/22/06    lap, dr Mancel Bale  . Other surgical history      lipoma removed from back 2012   . Cholecystectomy  10/28/2011    Procedure: LAPAROSCOPIC CHOLECYSTECTOMY WITH INTRAOPERATIVE CHOLANGIOGRAM;  Surgeon: Earnstine Regal, MD;  Location: [facility];  Service: General;  Laterality: N/A;  . Mass excision  08/24/2012    Procedure: EXCISION MASS;  Surgeon: Earnstine Regal, MD;  Location: [place];  Service: General;  Laterality: Left;  excisie soft tissue masses left shoulder(back) & left upper arm  . Colonoscopy  02-03-13    per Dr. Henrene Pastor, diverticuli but no polyps, repeat in 5 yrs   . Mass excision Left 01/24/2014    Procedure: EXCISION SOFT TISSUE MASSES LOWER LEFT BACK;  Surgeon: Earnstine Regal, MD;  Location: [place];  Service: General;  Laterality: Left;  . Esophagogastroduodenoscopy  02-03-13    per Dr. Henrene Pastor, normal   . Cardiac catheterization      back in 200 by Dr. Stanford Breed  . Tonsillectomy    . Mastectomy w/ sentinel node biopsy Right 10/02/2015  . Mastectomy w/ sentinel node biopsy Right 10/01/2015    Procedure: RIGHT MASTECTOMY WITH SENTINEL LYMPH NODE BIOPSY;  Surgeon: Autumn Messing III, MD;  Location: [place];  Service: General;  Laterality: Right;  . Portacath placement Left 11/01/2015    Procedure: INSERTION PORT-A-CATH;  Surgeon: Autumn Messing III, MD;  Location: [place];  Service: General;  Laterality: Left;    FAMILY HISTORY Family History  Problem Relation Age of Onset  . Crohn's disease Other     nephew  . Stomach cancer Mother   . Heart disease Father   . Prostate cancer Father   . Kidney cancer Sister     one out of the  four  . Hypertension Sister   . Hypertension Brother   . Colon cancer Maternal Grandmother   The patient's father died at the age of 46 with congestive heart failure. Her mother is still living as of November 2016, age 40. The patient had 6 brothers, 4 sisters. One sister was diagnosed with kidney cancer at age 68. ([place]). She is doing well. The patient's maternal grandmother was diagnosed with colon cancer at the age of 31. The patient's mother was diagnosed with a "rare stomach cancer" 20 years ago. The patient's father was diagnosed with prostate cancer at age 36. There is no history of breast or ovarian cancer in the family to her knowledge.   GYNECOLOGIC HISTORY:  No LMP recorded. Patient has had a hysterectomy.  menarche age 61, first live birth age 70, the patient is GX P3. She underwent remote hysterectomy with bilateral salpingo-oophorectomy. She did not use hormone replacement. She never used oral contraceptives.   SOCIAL HISTORY:  [name] used to work as a "[employer] but she is now disabled because of her multiple arthritic and degenerative problems. Her husband Elberta Fortis A. Bottcher works as a Freight forwarder. Son [name] works in long care in [place]. Son [name] works for a Arboriculturist in [place]. Daughter  [name] is a Haematologist in [place]. The patient has 5 grandchildren. She attends a Physicist, medical church    ADVANCED DIRECTIVES: Not in place   HEALTH MAINTENANCE: Social History  Substance Use Topics  . Smoking status: Current Every Day Smoker -- 0.25 packs/day for 20 years    Types: Cigarettes  . Smokeless tobacco: Former Systems developer    Quit date: 09/30/2015     Comment: quit  . Alcohol Use: No     Colonoscopy:February 2016   PJK:DTOIZT post hysterectomy   Bone density:  Lipid panel:  Allergies  Allergen Reactions  . Codeine Itching and Nausea And Vomiting    Itching all over the body  . Latex Hives  .  Sulfonamide Derivatives Hives    All over the body    Current Outpatient Prescriptions  Medication Sig Dispense Refill  . amitriptyline (ELAVIL) 25 MG tablet Take 25 mg by mouth at bedtime.     Marland Kitchen amLODipine (NORVASC) 5 MG tablet Take 1 tablet (5 mg total) by mouth 2 (two) times daily. 180 tablet 3  . furosemide (LASIX) 20 MG tablet TAKE 1 TABLET BY MOUTH EVERY DAY 30 tablet 6  . gabapentin (NEURONTIN) 300 MG capsule Take 1 capsule (300 mg total) by mouth at bedtime. 90 capsule 4  . lidocaine-prilocaine (EMLA) cream Apply 1 application topically as needed. 30 g 0  . lisinopril (PRINIVIL,ZESTRIL) 10 MG tablet Take 1 tablet (10 mg total) by mouth daily. 90 tablet 3  . metFORMIN (GLUCOPHAGE) 500 MG tablet Take 1 tablet (500 mg total) by mouth 2 (two) times daily with a meal. 180 tablet 3  . methadone (DOLOPHINE) 10 MG tablet Take 10 mg by mouth every 8 (eight) hours as needed for moderate pain.     . metoprolol (LOPRESSOR) 50 MG tablet Take 100 mg by mouth 2 (two) times daily.    Marland Kitchen omeprazole (PRILOSEC) 40 MG capsule Take 1 capsule (40 mg total) by mouth daily. 90 capsule 3  . ondansetron (ZOFRAN) 8 MG tablet Reported on 01/28/2016  1  . oxyCODONE (ROXICODONE) 15 MG immediate release tablet Take 15 mg by mouth every 6 (six) hours as needed for pain. Reported on 12/10/2015    . potassium chloride (K-DUR) 10 MEQ tablet Take 3 tablets (30 mEq total) by mouth 2 (two) times daily. (Patient taking differently: Take 20 mEq by mouth 2 (two) times daily. ) 180 tablet 11  . prochlorperazine (COMPAZINE) 10 MG tablet TK 1 T PO Q 6 H PRN NV  1  . temazepam (RESTORIL) 30 MG capsule TAKE ONE CAPSULE BY MOUTH AT BEDTIME AS NEEDED FOR SLEEP (Patient taking differently: TAKE ONE CAPSULE BY MOUTH AT BEDTIME) 90 capsule 1  . fluconazole (DIFLUCAN) 150 MG tablet Take 1 tablet (150 mg total) by mouth once. Can repeat dose x1 in 3 days. (Patient not taking: Reported on 01/28/2016) 2 tablet 0   No current  facility-administered medications for this visit.    OBJECTIVE: middle-aged African-American woman Who appears stated age  55 Vitals:   02/04/16 0837  BP: 130/85  Pulse: 75  Temp: 98.4 F (36.9 C)  Resp: 19     Body mass index is  40.84 kg/(m^2).    ECOG FS:1 - Symptomatic but completely ambulatory  Skin: warm, dry  HEENT: sclerae anicteric, conjunctivae pink, oropharynx clear. No thrush or mucositis.  Lymph Nodes: No cervical or supraclavicular lymphadenopathy  Lungs: clear to auscultation bilaterally, no rales, wheezes, or rhonci  Heart: regular rate and rhythm  Abdomen: round, soft, non tender, positive bowel sounds  Musculoskeletal: No focal spinal tenderness, no peripheral edema  Neuro: non focal, well oriented, positive affect  Breasts: deferred  LAB RESULTS:  CMP     Component Value Date/Time   NA 142 01/28/2016 1109   NA 140 01/09/2016 1841   K 4.1 01/28/2016 1109   K 2.7* 01/09/2016 1841   CL 104 01/09/2016 1841   CO2 26 01/28/2016 1109   CO2 24 01/09/2016 1841   GLUCOSE 129 01/28/2016 1109   GLUCOSE 144* 01/09/2016 1841   BUN 14.9 01/28/2016 1109   BUN 15 01/09/2016 1841   CREATININE 0.8 01/28/2016 1109   CREATININE 0.74 01/09/2016 1841   CALCIUM 9.5 01/28/2016 1109   CALCIUM 9.1 01/09/2016 1841   PROT 6.8 01/28/2016 1109   PROT 7.7 06/05/2015 1837   ALBUMIN 3.9 01/28/2016 1109   ALBUMIN 4.8 06/05/2015 1837   AST 15 01/28/2016 1109   AST 24 06/05/2015 1837   ALT 16 01/28/2016 1109   ALT 14 06/05/2015 1837   ALKPHOS 58 01/28/2016 1109   ALKPHOS 69 06/05/2015 1837   BILITOT 0.32 01/28/2016 1109   BILITOT 0.5 06/05/2015 1837   GFRNONAA >60 01/09/2016 1841   GFRAA >60 01/09/2016 1841    INo results found for: SPEP, UPEP  Lab Results  Component Value Date   WBC 6.4 02/04/2016   NEUTROABS 4.1 02/04/2016   HGB 11.2* 02/04/2016   HCT 34.2* 02/04/2016   MCV 94.5 02/04/2016   PLT 255 02/04/2016      Chemistry      Component Value Date/Time    NA 142 01/28/2016 1109   NA 140 01/09/2016 1841   K 4.1 01/28/2016 1109   K 2.7* 01/09/2016 1841   CL 104 01/09/2016 1841   CO2 26 01/28/2016 1109   CO2 24 01/09/2016 1841   BUN 14.9 01/28/2016 1109   BUN 15 01/09/2016 1841   CREATININE 0.8 01/28/2016 1109   CREATININE 0.74 01/09/2016 1841      Component Value Date/Time   CALCIUM 9.5 01/28/2016 1109   CALCIUM 9.1 01/09/2016 1841   ALKPHOS 58 01/28/2016 1109   ALKPHOS 69 06/05/2015 1837   AST 15 01/28/2016 1109   AST 24 06/05/2015 1837   ALT 16 01/28/2016 1109   ALT 14 06/05/2015 1837   BILITOT 0.32 01/28/2016 1109   BILITOT 0.5 06/05/2015 1837       No results found for: LABCA2  No components found for: LABCA125  No results for input(s): INR in the last 168 hours.  Urinalysis    Component Value Date/Time   COLORURINE YELLOW 01/09/2016 1948   APPEARANCEUR CLEAR 01/09/2016 1948   LABSPEC 1.020 01/09/2016 1948   PHURINE 6.0 01/09/2016 1948   GLUCOSEU NEGATIVE 01/09/2016 1948   HGBUR TRACE* 01/09/2016 1948   HGBUR large 07/01/2010 0945   BILIRUBINUR NEGATIVE 01/09/2016 1948   BILIRUBINUR n 02/26/2015 [place] 01/09/2016 1948   PROTEINUR NEGATIVE 01/09/2016 1948   PROTEINUR n 02/26/2015 1121   UROBILINOGEN 0.2 06/05/2015 2353   UROBILINOGEN 0.2 02/26/2015 1121   NITRITE NEGATIVE 01/09/2016 1948   NITRITE n 02/26/2015 1121   LEUKOCYTESUR MODERATE* 01/09/2016  1948   STUDIES: No results found.  ASSESSMENT: 55 y.o. [place] woman status post right breast biopsy 3 and axillary lymph node biopsy 08/02/2015 for a clinical mpT1c N0, s invasive ductal carcinoma, grade 1 or 2, estrogen receptor 95% positive, progesterone receptor 5% positive, with an MIB-1 between 5 and 10%, and HER-2 equivocal on 1 of the 2 biopsies (signals ratio 1.30, number per cell 4.0).tage IA   (1) status post right modified radical mastectomy 10/01/2015 for an mpT2 pN0, stage IIA invasive ductal carcinoma, grade 2, with a total  of 20 benign lymph nodes removed  (2) Oncotype DX score of 32 predicts an outside the breast recurrence risk of 22% within 10 years if the patient's only systemic therapy is tamoxifen for 5 years. It also predicts significant benefit from adjuvant chemotherapy.  (3) patient completed adjuvant doxorubicin and cyclophosphamide in dose dense fashion 4, last dose 12/24/2015, to be followed as tolerated by paclitaxel weekly 12 Starting 01/07/2016   (4) postmastectomy radiation to follow   (5) anti-estrogens to follow completion of local treatment  (a) consider PALLAS  Study  (b) consider BWEL study  PLAN: Icela looks and feels well today with some mild fatigue. The labs were reviewed in detail and were stable. Her borderline treatment related anemia is no worse. She will proceed with cycle 5 of paclitaxel as planned today.  [place] will return in 1 week for cycle 6 of treatment. She understands and agrees with this plan. She knows the goal of treatment in her case is cure. She has been encouraged to call with any issues that might arise before her next visit here.    Laurie Panda, NP   02/04/2016 8:54 AM

## 2016-02-04 NOTE — Progress Notes (Signed)
Patient ID: [name], female   DOB: Apr 20, 1961, 55 y.o.   MRN: [id number]    Cardio-Oncology Clinic Note  Referring Physician: Laurey Morale, MD as PCP - [place] III, MD as Consulting Physician (General Surgery) Chauncey Cruel, MD as Consulting Physician (Oncology) Thea Silversmith, MD as Consulting Physician (Radiation Oncology) Sylvan Cheese, NP as Nurse Practitioner (Hematology and Oncology) PCP: Laurey Morale, MD OTHER MD: Mardi Mainland MD, Nicholaus Bloom MD  HPI:  [name] is a 55 year old with history of morbid obesity, severe osteoarthritis s/r R and L TKR, HTN, and DM who was referred by Dr. Jana Hakim for enrollment into the cardio-oncology clinic.  Oncology history:   (1) status post right breast biopsy 3 and axillary lymph node biopsy 08/02/2015 for a clinical mpT1c N0, s invasive ductal carcinoma, grade 1 or 2, estrogen receptor 95% positive, progesterone receptor 5% positive, with an MIB-1 between 5 and 10%, and HER-2 equivocal on 1 of the 2 biopsies (signals ratio 1.30, number per cell 4.0).tage IA   (2) status post right modified radical mastectomy 10/01/2015 for an mpT2 pN0, stage IIA invasive ductal carcinoma, grade 2, with a total of 20 benign lymph nodes removed  (3) Oncotype DX score of 32 predicts an outside the breast recurrence risk of 22% within 10 years if the patient's only systemic therapy is tamoxifen for 5 years. It also predicts significant benefit from adjuvant chemotherapy.  (4) patient to receive adjuvant doxorubicin and cyclophosphamide 4, be followed as tolerated by paclitaxel weekly 12 (a) consider PREVENT study will her  (4) postmastectomy radiation to follow as appropriate  (5) anti-estrogens to follow completion of local treatment (a) consider PALLAS Study (b) consider BWEL study   She presents today for regular follow up.  Has finished initial chemo (4 rounds of  doxorubicin) and continues with week paclitaxel to the end of June. She is glad to be done with initial "rough" chemo.  Has had no problems with SOB/PND/orthopnea. Able to walk with a cane.   Echo 11/05/15 EF 65% Grade I DD. RV normal. Lateral s' 10.8 cm/s GLS -18.2% Echo 02/04/16 EF 60-65%, Lateral s' 11.1 cm/s,  GLS -16.9% - underestimated due to poor endocardial tracking  Past Medical History  Diagnosis Date  . Osteoarthritis   . Headache(784.0)     migraine like per patient  . Hypertension   . Low back pain     dr phillips, pain management  . Degenerative joint disease   . Gallstones   . GERD (gastroesophageal reflux disease)   . DDD (degenerative disc disease), cervical   . Colon polyps   . Diverticulosis   . Diabetes mellitus without complication ([place])   . Breast cancer of lower-inner quadrant of right female breast ([place]) 08/08/2015  . Breast cancer [facility])     Current Outpatient Prescriptions  Medication Sig Dispense Refill  . amitriptyline (ELAVIL) 25 MG tablet Take 25 mg by mouth at bedtime.     Marland Kitchen amLODipine (NORVASC) 5 MG tablet Take 1 tablet (5 mg total) by mouth 2 (two) times daily. 180 tablet 3  . furosemide (LASIX) 20 MG tablet TAKE 1 TABLET BY MOUTH EVERY DAY 30 tablet 6  . gabapentin (NEURONTIN) 300 MG capsule Take 1 capsule (300 mg total) by mouth at bedtime. 90 capsule 4  . lidocaine-prilocaine (EMLA) cream Apply 1 application topically as needed. 30 g 0  . lisinopril (PRINIVIL,ZESTRIL) 10 MG tablet Take 1 tablet (10 mg total) by  mouth daily. 90 tablet 3  . metFORMIN (GLUCOPHAGE) 500 MG tablet Take 1 tablet (500 mg total) by mouth 2 (two) times daily with a meal. 180 tablet 3  . methadone (DOLOPHINE) 10 MG tablet Take 10 mg by mouth every 8 (eight) hours as needed for moderate pain.     . metoprolol (LOPRESSOR) 50 MG tablet Take 100 mg by mouth 2 (two) times daily.    Marland Kitchen omeprazole (PRILOSEC) 40 MG capsule Take 1 capsule (40 mg total) by mouth daily. 90 capsule 3  .  ondansetron (ZOFRAN) 8 MG tablet Reported on 01/28/2016  1  . oxyCODONE (ROXICODONE) 15 MG immediate release tablet Take 15 mg by mouth every 6 (six) hours as needed for pain. Reported on 12/10/2015    . potassium chloride (K-DUR) 10 MEQ tablet Take 3 tablets (30 mEq total) by mouth 2 (two) times daily. (Patient taking differently: Take 20 mEq by mouth 2 (two) times daily. ) 180 tablet 11  . prochlorperazine (COMPAZINE) 10 MG tablet TK 1 T PO Q 6 H PRN NV  1  . temazepam (RESTORIL) 30 MG capsule TAKE ONE CAPSULE BY MOUTH AT BEDTIME AS NEEDED FOR SLEEP (Patient taking differently: TAKE ONE CAPSULE BY MOUTH AT BEDTIME) 90 capsule 1   No current facility-administered medications for this encounter.    Allergies  Allergen Reactions  . Codeine Itching and Nausea And Vomiting    Itching all over the body  . Latex Hives  . Sulfonamide Derivatives Hives    All over the body      Social History   Social History  . Marital Status: Married    Spouse Name: N/A  . Number of Children: 3  . Years of Education: N/A   Occupational History  . retired/disabled    Social History Main Topics  . Smoking status: Current Every Day Smoker -- 0.25 packs/day for 20 years    Types: Cigarettes  . Smokeless tobacco: Former Systems developer    Quit date: 09/30/2015     Comment: quit  . Alcohol Use: No  . Drug Use: No  . Sexual Activity: Yes    Birth Control/ Protection: Post-menopausal   Other Topics Concern  . Not on file   Social History Narrative      Family History  Problem Relation Age of Onset  . Crohn's disease Other     nephew  . Stomach cancer Mother   . Heart disease Father   . Prostate cancer Father   . Kidney cancer Sister     one out of the four  . Hypertension Sister   . Hypertension Brother   . Colon cancer Maternal Grandmother   FH: Brother Heart Failure/Amyloid, Sister HF , Dad deceased from Heart Failure   Filed Vitals:   02/04/16 1409  BP: 140/80  Pulse: 89  Weight: 260 lb 4  oz (118.049 kg)  SpO2: 100%    PHYSICAL EXAM: General:  WDWN, NAD HEENT: normal Neck: supple. no JVD. Carotids 2+ bilat; no bruits. No thyromegaly or nodule noted.  Cor: PMI nondisplaced.RRR. No M/G/R Lungs: CTAB, normal effort Abdomen: obese. soft, NT, ND, no HSM. No bruits or masses. +BS  Extremities: no cyanosis, clubbing, rash, R and LLE trace edema. R and L knee scar.  Neuro: alert & oriented x 3, cranial nerves grossly intact. moves all 4 extremities w/o difficulty. Affect pleasant.   ASSESSMENT & PLAN:  1. R Breast Cancer. S/P R Mastectomy -mpT2 pN0, stage IIA invasive ductal carcinoma, grade  2, with a total of 20 benign lymph nodes removed. Plan for adjuvant doxorubicin and cyclophosphamide 4, be followed as tolerated by paclitaxel weekly 12.  - Pt is aware that she is at risk for cardio-toxicity related to doxrubicin.  2. HTN  - Relatively stable. On lisinopril and lopressor.   Echo parameters stable from previous.  She has finished doxorubicin. Ongoing paclitaxel has no real cardiotoxicity profile.  Will follow up in 3 months with an ECHO, can likely see as needed after that.   Satira Mccallum Tillery PA-C  2:22 PM   Patient seen and examined with Oda Kilts, PA-C. We discussed all aspects of the encounter. I agree with the assessment and plan as stated above.   I reviewed echos personally. EF and Doppler parameters stable after Adriamycin therapy. No HF on exam.  Will see back for f/u in 3 months.   Bensimhon, Daniel,MD 11:28 PM

## 2016-02-11 ENCOUNTER — Other Ambulatory Visit (HOSPITAL_BASED_OUTPATIENT_CLINIC_OR_DEPARTMENT_OTHER): Payer: 59

## 2016-02-11 ENCOUNTER — Encounter: Payer: Self-pay | Admitting: Nurse Practitioner

## 2016-02-11 ENCOUNTER — Ambulatory Visit (HOSPITAL_BASED_OUTPATIENT_CLINIC_OR_DEPARTMENT_OTHER): Payer: 59

## 2016-02-11 ENCOUNTER — Telehealth: Payer: Self-pay | Admitting: Nurse Practitioner

## 2016-02-11 ENCOUNTER — Ambulatory Visit (HOSPITAL_BASED_OUTPATIENT_CLINIC_OR_DEPARTMENT_OTHER): Payer: 59 | Admitting: Nurse Practitioner

## 2016-02-11 ENCOUNTER — Encounter: Payer: Self-pay | Admitting: *Deleted

## 2016-02-11 VITALS — BP 153/91 | HR 79 | Temp 98.3°F | Resp 19 | Wt 260.1 lb

## 2016-02-11 DIAGNOSIS — C773 Secondary and unspecified malignant neoplasm of axilla and upper limb lymph nodes: Secondary | ICD-10-CM | POA: Diagnosis not present

## 2016-02-11 DIAGNOSIS — C50311 Malignant neoplasm of lower-inner quadrant of right female breast: Secondary | ICD-10-CM | POA: Diagnosis not present

## 2016-02-11 DIAGNOSIS — Z170 Estrogen receptor positive status [ER+]: Secondary | ICD-10-CM | POA: Diagnosis not present

## 2016-02-11 DIAGNOSIS — Z5111 Encounter for antineoplastic chemotherapy: Secondary | ICD-10-CM | POA: Diagnosis not present

## 2016-02-11 LAB — CBC WITH DIFFERENTIAL/PLATELET
BASO%: 0.3 % (ref 0.0–2.0)
Basophils Absolute: 0 10*3/uL (ref 0.0–0.1)
EOS%: 2 % (ref 0.0–7.0)
Eosinophils Absolute: 0.1 10*3/uL (ref 0.0–0.5)
HCT: 35 % (ref 34.8–46.6)
HGB: 11.6 g/dL (ref 11.6–15.9)
LYMPH%: 31.7 % (ref 14.0–49.7)
MCH: 31.4 pg (ref 25.1–34.0)
MCHC: 33.1 g/dL (ref 31.5–36.0)
MCV: 94.6 fL (ref 79.5–101.0)
MONO#: 0.3 10*3/uL (ref 0.1–0.9)
MONO%: 5.2 % (ref 0.0–14.0)
NEUT#: 3.6 10*3/uL (ref 1.5–6.5)
NEUT%: 60.8 % (ref 38.4–76.8)
Platelets: 287 10*3/uL (ref 145–400)
RBC: 3.7 10*6/uL (ref 3.70–5.45)
RDW: 16.1 % — ABNORMAL HIGH (ref 11.2–14.5)
WBC: 6 10*3/uL (ref 3.9–10.3)
lymph#: 1.9 10*3/uL (ref 0.9–3.3)

## 2016-02-11 LAB — COMPREHENSIVE METABOLIC PANEL
ALT: 15 U/L (ref 0–55)
AST: 17 U/L (ref 5–34)
Albumin: 3.9 g/dL (ref 3.5–5.0)
Alkaline Phosphatase: 59 U/L (ref 40–150)
Anion Gap: 7 mEq/L (ref 3–11)
BUN: 11.4 mg/dL (ref 7.0–26.0)
CO2: 30 mEq/L — ABNORMAL HIGH (ref 22–29)
Calcium: 9.3 mg/dL (ref 8.4–10.4)
Chloride: 106 mEq/L (ref 98–109)
Creatinine: 0.8 mg/dL (ref 0.6–1.1)
EGFR: >90 mL/min/{1.73_m2} (ref >90)
Glucose: 109 mg/dl (ref 70–140)
Potassium: 3.5 mEq/L (ref 3.5–5.1)
Sodium: 144 mEq/L (ref 136–145)
Total Bilirubin: <0.3 mg/dL (ref 0.20–1.20)
Total Protein: 6.8 g/dL (ref 6.4–8.3)

## 2016-02-11 MED ORDER — SODIUM CHLORIDE 0.9 % IV SOLN
Freq: Once | INTRAVENOUS | Status: AC
  Administered 2016-02-11: 12:00:00 via INTRAVENOUS

## 2016-02-11 MED ORDER — HEPARIN SOD (PORK) LOCK FLUSH 100 UNIT/ML IV SOLN
500.0000 [IU] | Freq: Once | INTRAVENOUS | Status: AC | PRN
  Administered 2016-02-11: 500 [IU]
  Filled 2016-02-11: qty 5

## 2016-02-11 MED ORDER — FAMOTIDINE IN NACL 20-0.9 MG/50ML-% IV SOLN
20.0000 mg | Freq: Once | INTRAVENOUS | Status: AC
  Administered 2016-02-11: 20 mg via INTRAVENOUS

## 2016-02-11 MED ORDER — SODIUM CHLORIDE 0.9% FLUSH
10.0000 mL | INTRAVENOUS | Status: DC | PRN
  Administered 2016-02-11: 10 mL
  Filled 2016-02-11: qty 10

## 2016-02-11 MED ORDER — DIPHENHYDRAMINE HCL 50 MG/ML IJ SOLN
INTRAMUSCULAR | Status: AC
  Filled 2016-02-11: qty 1

## 2016-02-11 MED ORDER — DIPHENHYDRAMINE HCL 50 MG/ML IJ SOLN
25.0000 mg | Freq: Once | INTRAMUSCULAR | Status: AC
  Administered 2016-02-11: 25 mg via INTRAVENOUS

## 2016-02-11 MED ORDER — SODIUM CHLORIDE 0.9 % IV SOLN
4.0000 mg | Freq: Once | INTRAVENOUS | Status: AC
  Administered 2016-02-11: 4 mg via INTRAVENOUS
  Filled 2016-02-11: qty 0.4

## 2016-02-11 MED ORDER — SODIUM CHLORIDE 0.9 % IV SOLN
80.0000 mg/m2 | Freq: Once | INTRAVENOUS | Status: AC
  Administered 2016-02-11: 186 mg via INTRAVENOUS
  Filled 2016-02-11: qty 31

## 2016-02-11 MED ORDER — FAMOTIDINE IN NACL 20-0.9 MG/50ML-% IV SOLN
INTRAVENOUS | Status: AC
  Filled 2016-02-11: qty 50

## 2016-02-11 NOTE — Telephone Encounter (Signed)
appt made and avs printed °

## 2016-02-11 NOTE — Patient Instructions (Signed)
[facility] Discharge Instructions for Patients Receiving Chemotherapy  Today you received the following chemotherapy agents Taxol  To help prevent nausea and vomiting after your treatment, we encourage you to take your nausea medication   If you develop nausea and vomiting that is not controlled by your nausea medication, call the clinic.   BELOW ARE SYMPTOMS THAT SHOULD BE REPORTED IMMEDIATELY:  *FEVER GREATER THAN 100.5 F  *CHILLS WITH OR WITHOUT FEVER  NAUSEA AND VOMITING THAT IS NOT CONTROLLED WITH YOUR NAUSEA MEDICATION  *UNUSUAL SHORTNESS OF BREATH  *UNUSUAL BRUISING OR BLEEDING  TENDERNESS IN MOUTH AND THROAT WITH OR WITHOUT PRESENCE OF ULCERS  *URINARY PROBLEMS  *BOWEL PROBLEMS  UNUSUAL RASH Items with * indicate a potential emergency and should be followed up as soon as possible.  Feel free to call the clinic you have any questions or concerns. The clinic phone number is [phone].  Please show the CHEMO ALERT CARD at check-in to the Emergency Department and triage nurse.   

## 2016-02-11 NOTE — Progress Notes (Signed)
[place]  [phone] [phone]   ID: [name] DOB: 08/14/1961  MR#: [id number]  PNT#:[id number]  Patient Care Team: Diane Morale, MD as PCP - Paint III, MD as Consulting Physician (General Surgery) Diane Cruel, MD as Consulting Physician (Oncology) Diane Silversmith, MD as Consulting Physician (Radiation Oncology) Diane Cheese, NP as Nurse Practitioner (Hematology and Oncology) PCP: Diane Morale, MD GYN: OTHER MD: Diane Mainland MD, Diane Bloom MD  CHIEF COMPLAINT: Multicentric breast cancer  CURRENT TREATMENT: Adjuvant chemotherapy ongoing  BREAST CANCER HISTORY: From the original intake note:  [name] herself noted a change in her right breast in October and brought it to the attention of her primary care physician. Her last mammogram had been April 2010. [name] set [name] up for bilateral diagnostic mammography with tomosynthesis and right [facility] November 04/11/2015. This found the breast density to be category B. In the right breast there were 3 microlobulated masses in the lower inner quadrant measuring 1.9, 1.5, and 1.5 cm. These were multicentric. There were also diffuse fine pleomorphic calcifications surrounding these masses, that area spanning 11.6 cm. The masses were palpable by exam at 4:00 8 cm from the nipple and at 5:00 3 cm from the nipple. By ultrasonography there was an irregular hypoechoic mass in the right breast at 4:00 measuring 1.7 cm, 1 medial to this measuring 1.1 cm, and then the third irregular hypoechoic mass at the 5:00 position measuring 1.3 cm. The right axilla showed multiple lymph nodes with thickened cortices. The largest lymph node measured 0.9 cm.  On 08/02/2015, [name] underwent biopsy of all 3 breast masses as well as a right axillary lymph node. 2 of the tumors were similar invasive ductal carcinomas, grade 2, estrogen receptor 95%  positive, progesterone receptor 5% positive, with an MIB-1 of 10%, and HER-2 equivocal, with a signals ratio of 1.30 and the number per cell 4.0. The third tumor appeared's grade 1 or 2 was also estrogen receptor positive at 95%, progesterone receptor positive at 5%, with an MIP-1 of 5%, and a similar HER-2 profile the signals ratio being 1.46 but the number per cell being only 3.95. By immunohistochemistry on this tumor HER-2 was negative at 1+. Immunohistochemistry on the equivocal reading is pending.  [name]'s subsequent history is as detailed below  INTERVAL HISTORY: [name] returns today for follow-up of her estrogen receptor positive breast cancer, alone. Today she is due for cycle 6 of 12 weekly planned doses of paclitaxel.   REVIEW OF SYSTEMS: [name] denies fevers or chills. She has mild nausea that is well controlled. She is moving her bowels well. She denies mouth sores, rashes, or neuropathy symptoms. Her appetite is good. She is fatigued on a daily basis now. She sleeps well. She is managing her chronic pain with narcotics daily. She continues on gabapentin and restoril for sleep QHS. A detailed review of systems is otherwise stable.  PAST MEDICAL HISTORY: Past Medical History  Diagnosis Date  . Osteoarthritis   . Headache(784.0)     migraine like per patient  . Hypertension   . Low back pain     [name], pain management  . Degenerative joint disease   . Gallstones   . GERD (gastroesophageal reflux disease)   . DDD (degenerative disc disease), cervical   . Colon polyps   . Diverticulosis   . Diabetes mellitus without complication ([place])   . Breast cancer of lower-inner quadrant of  right female breast ([place]) 08/08/2015  . Breast cancer ([place])     PAST SURGICAL HISTORY: Past Surgical History  Procedure Laterality Date  . Replacement total knee Right     right - Diane [name]  . Ganglion cyst excision Right     right wrist  . Lipoma excision Right 02/21/08    right hip,  [facility]  . Replacement total knee Left 06/25/09    left - Diane [name] [facility]  . Tonsillectomy    . Joint replacement      both knees have been done, four times each per patient. Her body rejects the implant, no infection.  . Abdominal hysterectomy  06/22/06    lap, Diane [name]  . Other surgical history      lipoma removed from back 2012   . Cholecystectomy  10/28/2011    Procedure: LAPAROSCOPIC CHOLECYSTECTOMY WITH INTRAOPERATIVE CHOLANGIOGRAM;  Surgeon: Diane Regal, MD;  Location: [facility];  Service: General;  Laterality: N/A;  . Mass excision  08/24/2012    Procedure: EXCISION MASS;  Surgeon: Diane Regal, MD;  Location: [facility];  Service: General;  Laterality: Left;  excisie soft tissue masses left shoulder(back) & left upper arm  . Colonoscopy  02-03-13    per Diane. Henrene [name], diverticuli but no polyps, repeat in 5 yrs   . Mass excision Left 01/24/2014    Procedure: EXCISION SOFT TISSUE MASSES LOWER LEFT BACK;  Surgeon: Diane Regal, MD;  Location: [facility];  Service: General;  Laterality: Left;  . Esophagogastroduodenoscopy  02-03-13    per Diane. Henrene [name], normal   . Cardiac catheterization      back in 200 by Diane. Stanford [name]  . Tonsillectomy    . Mastectomy w/ sentinel node biopsy Right 10/02/2015  . Mastectomy w/ sentinel node biopsy Right 10/01/2015    Procedure: RIGHT MASTECTOMY WITH SENTINEL LYMPH NODE BIOPSY;  Surgeon: Diane Messing III, MD;  Location: [facility];  Service: General;  Laterality: Right;  . Portacath placement Left 11/01/2015    Procedure: INSERTION PORT-A-CATH;  Surgeon: Diane Messing III, MD;  Location: [facility];  Service: General;  Laterality: Left;    FAMILY HISTORY Family History  Problem Relation Age of Onset  . Crohn's disease Other     nephew  . Stomach cancer Mother   . Heart disease Father   . Prostate cancer Father   . Kidney cancer Sister     one out of the four  . Hypertension Sister   . Hypertension Brother   . Colon cancer  Maternal Grandmother   The patient's father died at the age of 28 with congestive heart failure. Her mother is still living as of November 2016, age 71. The patient had 6 brothers, 4 sisters. One sister was diagnosed with kidney cancer at age 58. ([place]). She is doing well. The patient's maternal grandmother was diagnosed with colon cancer at the age of 40. The patient's mother was diagnosed with a "rare stomach cancer" 20 years ago. The patient's father was diagnosed with prostate cancer at age 90. There is no history of breast or ovarian cancer in the family to her knowledge.   GYNECOLOGIC HISTORY:  No LMP recorded. Patient has had a hysterectomy.  menarche age 58, first live birth age 56, the patient is GX P3. She underwent remote hysterectomy with bilateral salpingo-oophorectomy. She did not use hormone replacement. She never used oral contraceptives.   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 Her husband Elberta Fortis A. Kleiner works as a Freight forwarder. Son Dalphine Handing works in long care in [place]. Son Evette Doffing works for a Arboriculturist in [place]. Daughter  Silva Bandy is a Haematologist in [place]. The patient has 5 grandchildren. She attends a Physicist, medical church    ADVANCED DIRECTIVES: Not in place   HEALTH MAINTENANCE: Social History  Substance Use Topics  . Smoking status: Current Every Day Smoker -- 0.25 packs/day for 20 years    Types: Cigarettes  . Smokeless tobacco: Former Systems developer    Quit date: 09/30/2015     Comment: quit  . Alcohol Use: No     Colonoscopy:February 2016   [facility]:XNATFT post hysterectomy   Bone density:  Lipid panel:  Allergies  Allergen Reactions  . Codeine Itching and Nausea And Vomiting    Itching all over the body  . Latex Hives  . Sulfonamide Derivatives Hives    All over the body    Current Outpatient  Prescriptions  Medication Sig Dispense Refill  . amitriptyline (ELAVIL) 25 MG tablet Take 25 mg by mouth at bedtime.     Marland Kitchen amLODipine (NORVASC) 5 MG tablet Take 1 tablet (5 mg total) by mouth 2 (two) times daily. 180 tablet 3  . furosemide (LASIX) 20 MG tablet TAKE 1 TABLET BY MOUTH EVERY DAY 30 tablet 6  . gabapentin (NEURONTIN) 300 MG capsule Take 1 capsule (300 mg total) by mouth at bedtime. 90 capsule 4  . lidocaine-prilocaine (EMLA) cream Apply 1 application topically as needed. 30 g 0  . lisinopril (PRINIVIL,ZESTRIL) 10 MG tablet Take 1 tablet (10 mg total) by mouth daily. 90 tablet 3  . metFORMIN (GLUCOPHAGE) 500 MG tablet Take 1 tablet (500 mg total) by mouth 2 (two) times daily with a meal. 180 tablet 3  . methadone (DOLOPHINE) 10 MG tablet Take 10 mg by mouth every 8 (eight) hours as needed for moderate pain.     . metoprolol (LOPRESSOR) 50 MG tablet Take 100 mg by mouth 2 (two) times daily.    Marland Kitchen omeprazole (PRILOSEC) 40 MG capsule Take 1 capsule (40 mg total) by mouth daily. 90 capsule 3  . ondansetron (ZOFRAN) 8 MG tablet Reported on 01/28/2016  1  . oxyCODONE (ROXICODONE) 15 MG immediate release tablet Take 15 mg by mouth every 6 (six) hours as needed for pain. Reported on 12/10/2015    . potassium chloride (K-DUR) 10 MEQ tablet Take 3 tablets (30 mEq total) by mouth 2 (two) times daily. 180 tablet 11  . temazepam (RESTORIL) 30 MG capsule TAKE ONE CAPSULE BY MOUTH AT BEDTIME AS NEEDED FOR SLEEP 90 capsule 1   No current facility-administered medications for this visit.    OBJECTIVE: middle-aged African-American woman Who appears stated age  54 Vitals:   02/11/16 1021  BP: 153/91  Pulse: 79  Temp: 98.3 F (36.8 C)  Resp: 19     Body mass index is 40.73 kg/(m^2).    ECOG FS:1 - Symptomatic but completely ambulatory  Sclerae unicteric, pupils round and equal Oropharynx clear and moist-- no thrush or other lesions No cervical or supraclavicular adenopathy Lungs no rales or  rhonchi Heart regular rate and rhythm Abd soft, nontender, positive bowel sounds MSK no focal spinal tenderness, no upper extremity lymphedema Neuro: nonfocal, well oriented, appropriate affect Breasts: deferred  LAB RESULTS:  CMP     Component Value Date/Time  NA 143 02/04/2016 0822   NA 140 01/09/2016 1841   K 3.4* 02/04/2016 0822   K 2.7* 01/09/2016 1841   CL 104 01/09/2016 1841   CO2 29 02/04/2016 0822   CO2 24 01/09/2016 1841   GLUCOSE 106 02/04/2016 0822   GLUCOSE 144* 01/09/2016 1841   BUN 9.9 02/04/2016 0822   BUN 15 01/09/2016 1841   CREATININE 0.8 02/04/2016 0822   CREATININE 0.74 01/09/2016 1841   CALCIUM 9.3 02/04/2016 0822   CALCIUM 9.1 01/09/2016 1841   PROT 6.7 02/04/2016 0822   PROT 7.7 06/05/2015 1837   ALBUMIN 3.9 02/04/2016 0822   ALBUMIN 4.8 06/05/2015 1837   AST 15 02/04/2016 0822   AST 24 06/05/2015 1837   ALT 15 02/04/2016 0822   ALT 14 06/05/2015 1837   ALKPHOS 55 02/04/2016 0822   ALKPHOS 69 06/05/2015 1837   BILITOT <0.30 02/04/2016 0822   BILITOT 0.5 06/05/2015 1837   GFRNONAA >60 01/09/2016 1841   GFRAA >60 01/09/2016 1841    INo results found for: SPEP, UPEP  Lab Results  Component Value Date   WBC 6.0 02/11/2016   NEUTROABS 3.6 02/11/2016   HGB 11.6 02/11/2016   HCT 35.0 02/11/2016   MCV 94.6 02/11/2016   PLT 287 02/11/2016      Chemistry      Component Value Date/Time   NA 143 02/04/2016 0822   NA 140 01/09/2016 1841   K 3.4* 02/04/2016 0822   K 2.7* 01/09/2016 1841   CL 104 01/09/2016 1841   CO2 29 02/04/2016 0822   CO2 24 01/09/2016 1841   BUN 9.9 02/04/2016 0822   BUN 15 01/09/2016 1841   CREATININE 0.8 02/04/2016 0822   CREATININE 0.74 01/09/2016 1841      Component Value Date/Time   CALCIUM 9.3 02/04/2016 0822   CALCIUM 9.1 01/09/2016 1841   ALKPHOS 55 02/04/2016 0822   ALKPHOS 69 06/05/2015 1837   AST 15 02/04/2016 0822   AST 24 06/05/2015 1837   ALT 15 02/04/2016 0822   ALT 14 06/05/2015 1837    BILITOT <0.30 02/04/2016 0822   BILITOT 0.5 06/05/2015 1837       No results found for: LABCA2  No components found for: [id number]  No results for input(s): INR in the last 168 hours.  Urinalysis    Component Value Date/Time   COLORURINE YELLOW 01/09/2016 1948   APPEARANCEUR CLEAR 01/09/2016 1948   LABSPEC 1.020 01/09/2016 1948   PHURINE 6.0 01/09/2016 1948   GLUCOSEU NEGATIVE 01/09/2016 1948   HGBUR TRACE* 01/09/2016 1948   HGBUR large 07/01/2010 0945   BILIRUBINUR NEGATIVE 01/09/2016 1948   BILIRUBINUR n 02/26/2015 [place] 01/09/2016 1948   PROTEINUR NEGATIVE 01/09/2016 1948   PROTEINUR n 02/26/2015 1121   UROBILINOGEN 0.2 06/05/2015 2353   UROBILINOGEN 0.2 02/26/2015 1121   NITRITE NEGATIVE 01/09/2016 1948   NITRITE n 02/26/2015 1121   LEUKOCYTESUR MODERATE* 01/09/2016 1948   STUDIES: No results found.  ASSESSMENT: 55 y.o. [place] woman status post right breast biopsy 3 and axillary lymph node biopsy 08/02/2015 for a clinical mpT1c N0, s invasive ductal carcinoma, grade 1 or 2, estrogen receptor 95% positive, progesterone receptor 5% positive, with an MIB-1 between 5 and 10%, and HER-2 equivocal on 1 of the 2 biopsies (signals ratio 1.30, number per cell 4.0).tage IA   (1) status post right modified radical mastectomy 10/01/2015 for an mpT2 pN0, stage IIA invasive ductal carcinoma, grade 2, with a total of 20  benign lymph nodes removed  (2) Oncotype DX score of 32 predicts an outside the breast recurrence risk of 22% within 10 years if the patient's only systemic therapy is tamoxifen for 5 years. It also predicts significant benefit from adjuvant chemotherapy.  (3) patient completed adjuvant doxorubicin and cyclophosphamide in dose dense fashion 4, last dose 12/24/2015, to be followed as tolerated by paclitaxel weekly 12 Starting 01/07/2016   (4) postmastectomy radiation to follow   (5) anti-estrogens to follow completion of local  treatment  (a) consider PALLAS  Study  (b) consider BWEL study  PLAN: Miliani is doing well today with no new complaints. The labs were reviewed in detail and were stable. She will proceed with cycle 6 of paclitaxel as planned today.  Keerat will return in 1 week for cycle 7 of treatment. She understands and agrees with this plan. She knows the goal of treatment in her case is cure. She has been encouraged to call with any issues that might arise before her next visit here.    Laurie Panda, NP   02/11/2016 10:46 AM

## 2016-02-19 ENCOUNTER — Encounter: Payer: Self-pay | Admitting: Nurse Practitioner

## 2016-02-19 ENCOUNTER — Ambulatory Visit (HOSPITAL_BASED_OUTPATIENT_CLINIC_OR_DEPARTMENT_OTHER): Payer: 59 | Admitting: Nurse Practitioner

## 2016-02-19 ENCOUNTER — Ambulatory Visit (HOSPITAL_BASED_OUTPATIENT_CLINIC_OR_DEPARTMENT_OTHER): Payer: 59

## 2016-02-19 ENCOUNTER — Other Ambulatory Visit: Payer: Self-pay | Admitting: *Deleted

## 2016-02-19 ENCOUNTER — Other Ambulatory Visit (HOSPITAL_BASED_OUTPATIENT_CLINIC_OR_DEPARTMENT_OTHER): Payer: 59

## 2016-02-19 VITALS — BP 161/95 | HR 77 | Temp 98.2°F | Resp 19 | Ht 67.0 in | Wt 259.2 lb

## 2016-02-19 DIAGNOSIS — R300 Dysuria: Secondary | ICD-10-CM

## 2016-02-19 DIAGNOSIS — C50311 Malignant neoplasm of lower-inner quadrant of right female breast: Secondary | ICD-10-CM

## 2016-02-19 DIAGNOSIS — C773 Secondary and unspecified malignant neoplasm of axilla and upper limb lymph nodes: Secondary | ICD-10-CM | POA: Diagnosis not present

## 2016-02-19 DIAGNOSIS — R109 Unspecified abdominal pain: Secondary | ICD-10-CM | POA: Diagnosis not present

## 2016-02-19 DIAGNOSIS — Z5111 Encounter for antineoplastic chemotherapy: Secondary | ICD-10-CM | POA: Diagnosis not present

## 2016-02-19 DIAGNOSIS — Z170 Estrogen receptor positive status [ER+]: Secondary | ICD-10-CM

## 2016-02-19 LAB — CBC WITH DIFFERENTIAL/PLATELET
BASO%: 0.2 % (ref 0.0–2.0)
Basophils Absolute: 0 10*3/uL (ref 0.0–0.1)
EOS%: 1.7 % (ref 0.0–7.0)
Eosinophils Absolute: 0.1 10*3/uL (ref 0.0–0.5)
HCT: 34.7 % — ABNORMAL LOW (ref 34.8–46.6)
HGB: 11.3 g/dL — ABNORMAL LOW (ref 11.6–15.9)
LYMPH%: 24.1 % (ref 14.0–49.7)
MCH: 30.9 pg (ref 25.1–34.0)
MCHC: 32.6 g/dL (ref 31.5–36.0)
MCV: 94.8 fL (ref 79.5–101.0)
MONO#: 0.3 10*3/uL (ref 0.1–0.9)
MONO%: 5 % (ref 0.0–14.0)
NEUT#: 3.6 10*3/uL (ref 1.5–6.5)
NEUT%: 69 % (ref 38.4–76.8)
Platelets: 275 10*3/uL (ref 145–400)
RBC: 3.66 10*6/uL — ABNORMAL LOW (ref 3.70–5.45)
RDW: 15.6 % — ABNORMAL HIGH (ref 11.2–14.5)
WBC: 5.2 10*3/uL (ref 3.9–10.3)
lymph#: 1.3 10*3/uL (ref 0.9–3.3)

## 2016-02-19 LAB — URINALYSIS, MICROSCOPIC - CHCC
Bilirubin (Urine): NEGATIVE
Glucose: NEGATIVE mg/dL
Ketones: NEGATIVE mg/dL
Nitrite: NEGATIVE
Protein: NEGATIVE mg/dL
Specific Gravity, Urine: 1.01 (ref 1.003–1.035)
Urobilinogen, UR: 0.2 mg/dL (ref 0.2–1)
pH: 6 (ref 4.6–8.0)

## 2016-02-19 LAB — COMPREHENSIVE METABOLIC PANEL
ALT: 14 U/L (ref 0–55)
AST: 16 U/L (ref 5–34)
Albumin: 4 g/dL (ref 3.5–5.0)
Alkaline Phosphatase: 66 U/L (ref 40–150)
Anion Gap: 8 mEq/L (ref 3–11)
BUN: 13.6 mg/dL (ref 7.0–26.0)
CO2: 30 mEq/L — ABNORMAL HIGH (ref 22–29)
Calcium: 9.2 mg/dL (ref 8.4–10.4)
Chloride: 104 mEq/L (ref 98–109)
Creatinine: 0.8 mg/dL (ref 0.6–1.1)
EGFR: >90 mL/min/{1.73_m2} (ref >90)
Glucose: 132 mg/dl (ref 70–140)
Potassium: 3.3 mEq/L — ABNORMAL LOW (ref 3.5–5.1)
Sodium: 142 mEq/L (ref 136–145)
Total Bilirubin: 0.3 mg/dL (ref 0.20–1.20)
Total Protein: 6.9 g/dL (ref 6.4–8.3)

## 2016-02-19 MED ORDER — SODIUM CHLORIDE 0.9% FLUSH
10.0000 mL | INTRAVENOUS | Status: DC | PRN
  Administered 2016-02-19: 10 mL
  Filled 2016-02-19: qty 10

## 2016-02-19 MED ORDER — SODIUM CHLORIDE 0.9 % IV SOLN
Freq: Once | INTRAVENOUS | Status: AC
  Administered 2016-02-19: 12:00:00 via INTRAVENOUS

## 2016-02-19 MED ORDER — DIPHENHYDRAMINE HCL 50 MG/ML IJ SOLN
INTRAMUSCULAR | Status: AC
  Filled 2016-02-19: qty 1

## 2016-02-19 MED ORDER — FAMOTIDINE IN NACL 20-0.9 MG/50ML-% IV SOLN
INTRAVENOUS | Status: AC
  Filled 2016-02-19: qty 50

## 2016-02-19 MED ORDER — DEXAMETHASONE SODIUM PHOSPHATE 100 MG/10ML IJ SOLN
4.0000 mg | Freq: Once | INTRAMUSCULAR | Status: AC
  Administered 2016-02-19: 4 mg via INTRAVENOUS
  Filled 2016-02-19: qty 0.4

## 2016-02-19 MED ORDER — HEPARIN SOD (PORK) LOCK FLUSH 100 UNIT/ML IV SOLN
500.0000 [IU] | Freq: Once | INTRAVENOUS | Status: AC | PRN
  Administered 2016-02-19: 500 [IU]
  Filled 2016-02-19: qty 5

## 2016-02-19 MED ORDER — FAMOTIDINE IN NACL 20-0.9 MG/50ML-% IV SOLN
20.0000 mg | Freq: Once | INTRAVENOUS | Status: AC
  Administered 2016-02-19: 20 mg via INTRAVENOUS

## 2016-02-19 MED ORDER — SODIUM CHLORIDE 0.9 % IV SOLN
80.0000 mg/m2 | Freq: Once | INTRAVENOUS | Status: AC
  Administered 2016-02-19: 186 mg via INTRAVENOUS
  Filled 2016-02-19: qty 31

## 2016-02-19 MED ORDER — CIPROFLOXACIN HCL 500 MG PO TABS: 500.0000 mg | ORAL_TABLET | Freq: Two times a day (BID) | ORAL | Status: DC

## 2016-02-19 MED ORDER — DIPHENHYDRAMINE HCL 50 MG/ML IJ SOLN
25.0000 mg | Freq: Once | INTRAMUSCULAR | Status: AC
  Administered 2016-02-19: 0.25 mg via INTRAVENOUS

## 2016-02-19 NOTE — Progress Notes (Signed)
[place]  [phone] [phone]   ID: [name] DOB: 10-31-1960  MR#: [id number]  YTK#:[id number]  Patient Care Team: Laurey Morale, MD as PCP - Amorita III, MD as Consulting Physician (General Surgery) Chauncey Cruel, MD as Consulting Physician (Oncology) Thea Silversmith, MD as Consulting Physician (Radiation Oncology) Sylvan Cheese, NP as Nurse Practitioner (Hematology and Oncology) PCP: Laurey Morale, MD GYN: OTHER MD: Mardi Mainland MD, Nicholaus Bloom MD  CHIEF COMPLAINT: Multicentric breast cancer  CURRENT TREATMENT: Adjuvant chemotherapy  BREAST CANCER HISTORY: From the original intake note:  [name] herself noted a change in her right breast in October and brought it to the attention of her primary care physician. Her last mammogram had been April 2010. Dr. Sarajane Jews set [name] up for bilateral diagnostic mammography with tomosynthesis and [facility] 04/11/2015. This found the breast density to be category B. In the right breast there were 3 microlobulated masses in the lower inner quadrant measuring 1.9, 1.5, and 1.5 cm. These were multicentric. There were also diffuse fine pleomorphic calcifications surrounding these masses, that area spanning 11.6 cm. The masses were palpable by exam at 4:00 8 cm from the nipple and at 5:00 3 cm from the nipple. By ultrasonography there was an irregular hypoechoic mass in the right breast at 4:00 measuring 1.7 cm, 1 medial to this measuring 1.1 cm, and then the third irregular hypoechoic mass at the 5:00 position measuring 1.3 cm. The right axilla showed multiple lymph nodes with thickened cortices. The largest lymph node measured 0.9 cm.  On 08/02/2015, [name] underwent biopsy of all 3 breast masses as well as a right axillary lymph node. 2 of the tumors were similar invasive ductal carcinomas, grade 2, estrogen receptor 95% positive,  progesterone receptor 5% positive, with an MIB-1 of 10%, and HER-2 equivocal, with a signals ratio of 1.30 and the number per cell 4.0. The third tumor appeared's grade 1 or 2 was also estrogen receptor positive at 95%, progesterone receptor positive at 5%, with an MIP-1 of 5%, and a similar HER-2 profile the signals ratio being 1.46 but the number per cell being only 3.95. By immunohistochemistry on this tumor HER-2 was negative at 1+. Immunohistochemistry on the equivocal reading is pending.  Eather's subsequent history is as detailed below  INTERVAL HISTORY: [name] returns today for follow-up of her estrogen receptor positive breast cancer, accompanied by a friend. Today she is due for cycle 7 of 12 weekly planned doses of paclitaxel.   REVIEW OF SYSTEMS: Arron's main complaint today is flank pain and discomfort with urination. She denies frequency, odor, or color change. She is staying well hydrated. She denies fevers, chills, nausea, vomiting, or changes in bowel habits. She has no mouth sores, rashes, or neuropathy symptoms. Her only complaint with treatment is fatigue. She is managing her chronic pain with narcotics daily. She continues on gabapentin and restoril for sleep QHS. A detailed review of systems is otherwise stable.  PAST MEDICAL HISTORY: Past Medical History  Diagnosis Date  . Osteoarthritis   . Headache(784.0)     migraine like per patient  . Hypertension   . Low back pain     dr phillips, pain management  . Degenerative joint disease   . Gallstones   . GERD (gastroesophageal reflux disease)   . DDD (degenerative disc disease), cervical   . Colon polyps   . Diverticulosis   . Diabetes mellitus without complication (  [place])   . Breast cancer of lower-inner quadrant of right female breast ([place]) 08/08/2015  . Breast cancer ([place])     PAST SURGICAL HISTORY: Past Surgical History  Procedure Laterality Date  . Replacement total knee Right     right - dr Novella Olive  .  Ganglion cyst excision Right     right wrist  . Lipoma excision Right 02/21/08    right hip, [facility]  . Replacement total knee Left 06/25/09    left - dr Marciano Sequin [facility]  . Tonsillectomy    . Joint replacement      both knees have been done, four times each per patient. Her body rejects the implant, no infection.  . Abdominal hysterectomy  06/22/06    lap, dr Mancel Bale  . Other surgical history      lipoma removed from back 2012   . Cholecystectomy  10/28/2011    Procedure: LAPAROSCOPIC CHOLECYSTECTOMY WITH INTRAOPERATIVE CHOLANGIOGRAM;  Surgeon: Earnstine Regal, MD;  Location: [facility];  Service: General;  Laterality: N/A;  . Mass excision  08/24/2012    Procedure: EXCISION MASS;  Surgeon: Earnstine Regal, MD;  Location: [facility];  Service: General;  Laterality: Left;  excisie soft tissue masses left shoulder(back) & left upper arm  . Colonoscopy  02-03-13    per Dr. Henrene Pastor, diverticuli but no polyps, repeat in 5 yrs   . Mass excision Left 01/24/2014    Procedure: EXCISION SOFT TISSUE MASSES LOWER LEFT BACK;  Surgeon: Earnstine Regal, MD;  Location: [facility];  Service: General;  Laterality: Left;  . Esophagogastroduodenoscopy  02-03-13    per Dr. Henrene Pastor, normal   . Cardiac catheterization      back in 200 by Dr. Stanford Breed  . Tonsillectomy    . Mastectomy w/ sentinel node biopsy Right 10/02/2015  . Mastectomy w/ sentinel node biopsy Right 10/01/2015    Procedure: RIGHT MASTECTOMY WITH SENTINEL LYMPH NODE BIOPSY;  Surgeon: Autumn Messing III, MD;  Location: [facility];  Service: General;  Laterality: Right;  . Portacath placement Left 11/01/2015    Procedure: INSERTION PORT-A-CATH;  Surgeon: Autumn Messing III, MD;  Location: [facility];  Service: General;  Laterality: Left;    FAMILY HISTORY Family History  Problem Relation Age of Onset  . Crohn's disease Other     nephew  . Stomach cancer Mother   . Heart disease Father   . Prostate cancer Father   . Kidney cancer Sister      one out of the four  . Hypertension Sister   . Hypertension Brother   . Colon cancer Maternal Grandmother   The patient's father died at the age of 75 with congestive heart failure. Her mother is still living as of November 2016, age 56. The patient had 6 brothers, 4 sisters. One sister was diagnosed with kidney cancer at age 70. ([place]). She is doing well. The patient's maternal grandmother was diagnosed with colon cancer at the age of 10. The patient's mother was diagnosed with a "rare stomach cancer" 20 years ago. The patient's father was diagnosed with prostate cancer at age 44. There is no history of breast or ovarian cancer in the family to her knowledge.   GYNECOLOGIC HISTORY:  No LMP recorded. Patient has had a hysterectomy.  menarche age 42, first live birth age 25, the patient is GX P3. She underwent remote hysterectomy with bilateral salpingo-oophorectomy. She did not use hormone replacement. She never used oral contraceptives.  SOCIAL HISTORY:  Maurina used to work as a "[employer] but she is now disabled because of her multiple arthritic and degenerative problems. Her husband Elberta Fortis A. Haviland works as a Freight forwarder. Son Dalphine Handing works in long care in [place]. Son Evette Doffing works for a Arboriculturist in [place]. Daughter  Silva Bandy is a Haematologist in [place]. The patient has 5 grandchildren. She attends a Physicist, medical church    ADVANCED DIRECTIVES: Not in place   HEALTH MAINTENANCE: Social History  Substance Use Topics  . Smoking status: Current Every Day Smoker -- 0.25 packs/day for 20 years    Types: Cigarettes  . Smokeless tobacco: Former Systems developer    Quit date: 09/30/2015     Comment: quit  . Alcohol Use: No     Colonoscopy:February 2016   SEL:TRVUYE post hysterectomy   Bone density:  Lipid panel:  Allergies  Allergen Reactions  . Codeine Itching and Nausea And Vomiting    Itching all over the body  .  Latex Hives  . Sulfonamide Derivatives Hives    All over the body    Current Outpatient Prescriptions  Medication Sig Dispense Refill  . amitriptyline (ELAVIL) 25 MG tablet Take 25 mg by mouth at bedtime.     Marland Kitchen amLODipine (NORVASC) 5 MG tablet Take 1 tablet (5 mg total) by mouth 2 (two) times daily. 180 tablet 3  . furosemide (LASIX) 20 MG tablet TAKE 1 TABLET BY MOUTH EVERY DAY 30 tablet 6  . gabapentin (NEURONTIN) 300 MG capsule Take 1 capsule (300 mg total) by mouth at bedtime. 90 capsule 4  . lidocaine-prilocaine (EMLA) cream Apply 1 application topically as needed. 30 g 0  . lisinopril (PRINIVIL,ZESTRIL) 10 MG tablet Take 1 tablet (10 mg total) by mouth daily. 90 tablet 3  . metFORMIN (GLUCOPHAGE) 500 MG tablet Take 1 tablet (500 mg total) by mouth 2 (two) times daily with a meal. 180 tablet 3  . methadone (DOLOPHINE) 10 MG tablet Take 10 mg by mouth every 8 (eight) hours as needed for moderate pain.     . metoprolol (LOPRESSOR) 50 MG tablet Take 100 mg by mouth 2 (two) times daily.    Marland Kitchen omeprazole (PRILOSEC) 40 MG capsule Take 1 capsule (40 mg total) by mouth daily. 90 capsule 3  . ondansetron (ZOFRAN) 8 MG tablet Reported on 01/28/2016  1  . oxyCODONE (ROXICODONE) 15 MG immediate release tablet Take 15 mg by mouth every 6 (six) hours as needed for pain. Reported on 12/10/2015    . potassium chloride (K-DUR) 10 MEQ tablet Take 3 tablets (30 mEq total) by mouth 2 (two) times daily. 180 tablet 11  . temazepam (RESTORIL) 30 MG capsule TAKE ONE CAPSULE BY MOUTH AT BEDTIME AS NEEDED FOR SLEEP 90 capsule 1  . ciprofloxacin (CIPRO) 500 MG tablet Take 1 tablet (500 mg total) by mouth 2 (two) times daily. 10 tablet 0   No current facility-administered medications for this visit.    OBJECTIVE: middle-aged African-American woman Who appears stated age  55 Vitals:   02/19/16 1056  BP: 161/95  Pulse: 77  Temp: 98.2 F (36.8 C)  Resp: 19     Body mass index is 40.59 kg/(m^2).    ECOG FS:1 -  Symptomatic but completely ambulatory  Skin: warm, dry  HEENT: sclerae anicteric, conjunctivae pink, oropharynx clear. No thrush or mucositis.  Lymph Nodes: No cervical or supraclavicular lymphadenopathy  Lungs: clear to auscultation bilaterally, no rales, wheezes, or rhonci  Heart: regular rate and rhythm  Abdomen: round, soft, non tender, positive bowel sounds  Musculoskeletal: No focal spinal tenderness, no peripheral edema  Neuro: non focal, well oriented, positive affect  Breasts: deferred  LAB RESULTS:  CMP     Component Value Date/Time   NA 144 02/11/2016 1009   NA 140 01/09/2016 1841   K 3.5 02/11/2016 1009   K 2.7* 01/09/2016 1841   CL 104 01/09/2016 1841   CO2 30* 02/11/2016 1009   CO2 24 01/09/2016 1841   GLUCOSE 109 02/11/2016 1009   GLUCOSE 144* 01/09/2016 1841   BUN 11.4 02/11/2016 1009   BUN 15 01/09/2016 1841   CREATININE 0.8 02/11/2016 1009   CREATININE 0.74 01/09/2016 1841   CALCIUM 9.3 02/11/2016 1009   CALCIUM 9.1 01/09/2016 1841   PROT 6.8 02/11/2016 1009   PROT 7.7 06/05/2015 1837   ALBUMIN 3.9 02/11/2016 1009   ALBUMIN 4.8 06/05/2015 1837   AST 17 02/11/2016 1009   AST 24 06/05/2015 1837   ALT 15 02/11/2016 1009   ALT 14 06/05/2015 1837   ALKPHOS 59 02/11/2016 1009   ALKPHOS 69 06/05/2015 1837   BILITOT <0.30 02/11/2016 1009   BILITOT 0.5 06/05/2015 1837   GFRNONAA >60 01/09/2016 1841   GFRAA >60 01/09/2016 1841    INo results found for: SPEP, UPEP  Lab Results  Component Value Date   WBC 5.2 02/19/2016   NEUTROABS 3.6 02/19/2016   HGB 11.3* 02/19/2016   HCT 34.7* 02/19/2016   MCV 94.8 02/19/2016   PLT 275 02/19/2016      Chemistry      Component Value Date/Time   NA 144 02/11/2016 1009   NA 140 01/09/2016 1841   K 3.5 02/11/2016 1009   K 2.7* 01/09/2016 1841   CL 104 01/09/2016 1841   CO2 30* 02/11/2016 1009   CO2 24 01/09/2016 1841   BUN 11.4 02/11/2016 1009   BUN 15 01/09/2016 1841   CREATININE 0.8 02/11/2016 1009    CREATININE 0.74 01/09/2016 1841      Component Value Date/Time   CALCIUM 9.3 02/11/2016 1009   CALCIUM 9.1 01/09/2016 1841   ALKPHOS 59 02/11/2016 1009   ALKPHOS 69 06/05/2015 1837   AST 17 02/11/2016 1009   AST 24 06/05/2015 1837   ALT 15 02/11/2016 1009   ALT 14 06/05/2015 1837   BILITOT <0.30 02/11/2016 1009   BILITOT 0.5 06/05/2015 1837       No results found for: LABCA2  No components found for: LABCA125  No results for input(s): INR in the last 168 hours.  Urinalysis    Component Value Date/Time   COLORURINE YELLOW 01/09/2016 1948   APPEARANCEUR CLEAR 01/09/2016 1948   LABSPEC 1.020 01/09/2016 1948   PHURINE 6.0 01/09/2016 1948   GLUCOSEU NEGATIVE 01/09/2016 1948   HGBUR TRACE* 01/09/2016 1948   HGBUR large 07/01/2010 0945   BILIRUBINUR NEGATIVE 01/09/2016 1948   BILIRUBINUR n 02/26/2015 [place] 01/09/2016 1948   PROTEINUR NEGATIVE 01/09/2016 1948   PROTEINUR n 02/26/2015 1121   UROBILINOGEN 0.2 06/05/2015 2353   UROBILINOGEN 0.2 02/26/2015 1121   NITRITE NEGATIVE 01/09/2016 1948   NITRITE n 02/26/2015 1121   LEUKOCYTESUR MODERATE* 01/09/2016 1948   STUDIES: No results found.  ASSESSMENT: 55 y.o. [place] woman status post right breast biopsy 3 and axillary lymph node biopsy 08/02/2015 for a clinical mpT1c N0, s invasive ductal carcinoma, grade 1 or 2, estrogen receptor 95% positive, progesterone receptor 5% positive, with an  MIB-1 between 5 and 10%, and HER-2 equivocal on 1 of the 2 biopsies (signals ratio 1.30, number per cell 4.0).tage IA   (1) status post right modified radical mastectomy 10/01/2015 for an mpT2 pN0, stage IIA invasive ductal carcinoma, grade 2, with a total of 20 benign lymph nodes removed  (2) Oncotype DX score of 32 predicts an outside the breast recurrence risk of 22% within 10 years if the patient's only systemic therapy is tamoxifen for 5 years. It also predicts significant benefit from adjuvant  chemotherapy.  (3) patient completed adjuvant doxorubicin and cyclophosphamide in dose dense fashion 4, last dose 12/24/2015, to be followed as tolerated by paclitaxel weekly 12 Starting 01/07/2016   (4) postmastectomy radiation to follow   (5) anti-estrogens to follow completion of local treatment  (a) consider PALLAS  Study  (b) consider BWEL study  PLAN: Blessen is likely suffering from a UTI. I am going to check a urin culture to confirm, but in the meantime, I am starting her on cipr 564m BID x 5 days. The labs were reviewed in detail and were stable. She will proceed with cycle 7of paclitaxel as planned today.  BNimsiwill return in 1 week for cycle 8 of treatment. She understands and agrees with this plan. She knows the goal of treatment in her case is cure. She has been encouraged to call with any issues that might arise before her next visit here.    HLaurie Panda NP   02/19/2016 11:16 AM

## 2016-02-19 NOTE — Patient Instructions (Signed)
[facility] Discharge Instructions for Patients Receiving Chemotherapy  Today you received the following chemotherapy agents Taxol  To help prevent nausea and vomiting after your treatment, we encourage you to take your nausea medication   If you develop nausea and vomiting that is not controlled by your nausea medication, call the clinic.   BELOW ARE SYMPTOMS THAT SHOULD BE REPORTED IMMEDIATELY:  *FEVER GREATER THAN 100.5 F  *CHILLS WITH OR WITHOUT FEVER  NAUSEA AND VOMITING THAT IS NOT CONTROLLED WITH YOUR NAUSEA MEDICATION  *UNUSUAL SHORTNESS OF BREATH  *UNUSUAL BRUISING OR BLEEDING  TENDERNESS IN MOUTH AND THROAT WITH OR WITHOUT PRESENCE OF ULCERS  *URINARY PROBLEMS  *BOWEL PROBLEMS  UNUSUAL RASH Items with * indicate a potential emergency and should be followed up as soon as possible.  Feel free to call the clinic you have any questions or concerns. The clinic phone number is [phone].  Please show the CHEMO ALERT CARD at check-in to the Emergency Department and triage nurse.   

## 2016-02-22 LAB — URINE CULTURE

## 2016-02-24 ENCOUNTER — Other Ambulatory Visit: Payer: Self-pay | Admitting: Family Medicine

## 2016-02-25 ENCOUNTER — Ambulatory Visit (HOSPITAL_BASED_OUTPATIENT_CLINIC_OR_DEPARTMENT_OTHER): Payer: 59 | Admitting: Nurse Practitioner

## 2016-02-25 ENCOUNTER — Other Ambulatory Visit: Payer: Self-pay | Admitting: Nurse Practitioner

## 2016-02-25 ENCOUNTER — Ambulatory Visit (HOSPITAL_BASED_OUTPATIENT_CLINIC_OR_DEPARTMENT_OTHER): Payer: 59

## 2016-02-25 ENCOUNTER — Encounter: Payer: Self-pay | Admitting: *Deleted

## 2016-02-25 ENCOUNTER — Encounter: Payer: Self-pay | Admitting: Nurse Practitioner

## 2016-02-25 ENCOUNTER — Other Ambulatory Visit (HOSPITAL_BASED_OUTPATIENT_CLINIC_OR_DEPARTMENT_OTHER): Payer: 59

## 2016-02-25 VITALS — BP 134/82 | HR 81 | Temp 98.4°F | Resp 18 | Wt 264.2 lb

## 2016-02-25 DIAGNOSIS — Z720 Tobacco use: Secondary | ICD-10-CM

## 2016-02-25 DIAGNOSIS — Z5111 Encounter for antineoplastic chemotherapy: Secondary | ICD-10-CM | POA: Diagnosis not present

## 2016-02-25 DIAGNOSIS — C50311 Malignant neoplasm of lower-inner quadrant of right female breast: Secondary | ICD-10-CM

## 2016-02-25 DIAGNOSIS — C773 Secondary and unspecified malignant neoplasm of axilla and upper limb lymph nodes: Secondary | ICD-10-CM

## 2016-02-25 LAB — COMPREHENSIVE METABOLIC PANEL
ALT: 12 U/L (ref 0–55)
AST: 15 U/L (ref 5–34)
Albumin: 3.6 g/dL (ref 3.5–5.0)
Alkaline Phosphatase: 61 U/L (ref 40–150)
Anion Gap: 9 mEq/L (ref 3–11)
BUN: 12.1 mg/dL (ref 7.0–26.0)
CO2: 27 mEq/L (ref 22–29)
Calcium: 9.2 mg/dL (ref 8.4–10.4)
Chloride: 107 mEq/L (ref 98–109)
Creatinine: 0.9 mg/dL (ref 0.6–1.1)
EGFR: 83 mL/min/{1.73_m2} — ABNORMAL LOW (ref >90)
Glucose: 145 mg/dl — ABNORMAL HIGH (ref 70–140)
Potassium: 3.4 mEq/L — ABNORMAL LOW (ref 3.5–5.1)
Sodium: 143 mEq/L (ref 136–145)
Total Bilirubin: 0.39 mg/dL (ref 0.20–1.20)
Total Protein: 6.4 g/dL (ref 6.4–8.3)

## 2016-02-25 LAB — CBC WITH DIFFERENTIAL/PLATELET
BASO%: 0.4 % (ref 0.0–2.0)
Basophils Absolute: 0 10*3/uL (ref 0.0–0.1)
EOS%: 2.5 % (ref 0.0–7.0)
Eosinophils Absolute: 0.1 10*3/uL (ref 0.0–0.5)
HCT: 33.6 % — ABNORMAL LOW (ref 34.8–46.6)
HGB: 11.1 g/dL — ABNORMAL LOW (ref 11.6–15.9)
LYMPH%: 28.4 % (ref 14.0–49.7)
MCH: 31.5 pg (ref 25.1–34.0)
MCHC: 33 g/dL (ref 31.5–36.0)
MCV: 95.5 fL (ref 79.5–101.0)
MONO#: 0.3 10*3/uL (ref 0.1–0.9)
MONO%: 4.9 % (ref 0.0–14.0)
NEUT#: 3.5 10*3/uL (ref 1.5–6.5)
NEUT%: 63.8 % (ref 38.4–76.8)
Platelets: 223 10*3/uL (ref 145–400)
RBC: 3.52 10*6/uL — ABNORMAL LOW (ref 3.70–5.45)
RDW: 15.4 % — ABNORMAL HIGH (ref 11.2–14.5)
WBC: 5.5 10*3/uL (ref 3.9–10.3)
lymph#: 1.6 10*3/uL (ref 0.9–3.3)
nRBC: 0 % (ref 0–0)

## 2016-02-25 MED ORDER — DIPHENHYDRAMINE HCL 50 MG/ML IJ SOLN
25.0000 mg | Freq: Once | INTRAMUSCULAR | Status: AC
  Administered 2016-02-25: 25 mg via INTRAVENOUS

## 2016-02-25 MED ORDER — FAMOTIDINE IN NACL 20-0.9 MG/50ML-% IV SOLN
INTRAVENOUS | Status: AC
  Filled 2016-02-25: qty 50

## 2016-02-25 MED ORDER — SODIUM CHLORIDE 0.9 % IV SOLN
80.0000 mg/m2 | Freq: Once | INTRAVENOUS | Status: AC
  Administered 2016-02-25: 186 mg via INTRAVENOUS
  Filled 2016-02-25: qty 31

## 2016-02-25 MED ORDER — SODIUM CHLORIDE 0.9% FLUSH
10.0000 mL | INTRAVENOUS | Status: DC | PRN
  Administered 2016-02-25: 10 mL
  Filled 2016-02-25: qty 10

## 2016-02-25 MED ORDER — HEPARIN SOD (PORK) LOCK FLUSH 100 UNIT/ML IV SOLN
500.0000 [IU] | Freq: Once | INTRAVENOUS | Status: AC | PRN
  Administered 2016-02-25: 500 [IU]
  Filled 2016-02-25: qty 5

## 2016-02-25 MED ORDER — SODIUM CHLORIDE 0.9 % IV SOLN
Freq: Once | INTRAVENOUS | Status: AC
  Administered 2016-02-25: 10:00:00 via INTRAVENOUS

## 2016-02-25 MED ORDER — FAMOTIDINE IN NACL 20-0.9 MG/50ML-% IV SOLN
20.0000 mg | Freq: Once | INTRAVENOUS | Status: AC
  Administered 2016-02-25: 20 mg via INTRAVENOUS

## 2016-02-25 MED ORDER — SODIUM CHLORIDE 0.9 % IV SOLN
4.0000 mg | Freq: Once | INTRAVENOUS | Status: AC
  Administered 2016-02-25: 4 mg via INTRAVENOUS
  Filled 2016-02-25: qty 0.4

## 2016-02-25 MED ORDER — DIPHENHYDRAMINE HCL 50 MG/ML IJ SOLN
INTRAMUSCULAR | Status: AC
  Filled 2016-02-25: qty 1

## 2016-02-25 NOTE — Progress Notes (Signed)
[facility]  [phone] [phone]   ID: [name] DOB: Feb 26, 1961  MR#: [id number]  XBM#:[id number]  Patient Care Team: Laurey Morale, MD as PCP - [place] III, MD as Consulting Physician (General Surgery) Chauncey Cruel, MD as Consulting Physician (Oncology) Thea Silversmith, MD as Consulting Physician (Radiation Oncology) Sylvan Cheese, NP as Nurse Practitioner (Hematology and Oncology) PCP: Laurey Morale, MD GYN: OTHER MD: Mardi Mainland MD, Nicholaus Bloom MD  CHIEF COMPLAINT: Multicentric breast cancer  CURRENT TREATMENT: Adjuvant chemotherapy  BREAST CANCER HISTORY: From the original intake note:  [name] herself noted a change in her right breast in October and brought it to the attention of her primary care physician. Her last mammogram had been April 2010. Dr. Sarajane Jews set [name] up for bilateral diagnostic mammography with tomosynthesis and [facility] November 04/11/2015. This found the breast density to be category B. In the right breast there were 3 microlobulated masses in the lower inner quadrant measuring 1.9, 1.5, and 1.5 cm. These were multicentric. There were also diffuse fine pleomorphic calcifications surrounding these masses, that area spanning 11.6 cm. The masses were palpable by exam at 4:00 8 cm from the nipple and at 5:00 3 cm from the nipple. By ultrasonography there was an irregular hypoechoic mass in the right breast at 4:00 measuring 1.7 cm, 1 medial to this measuring 1.1 cm, and then the third irregular hypoechoic mass at the 5:00 position measuring 1.3 cm. The right axilla showed multiple lymph nodes with thickened cortices. The largest lymph node measured 0.9 cm.  On 08/02/2015, [name] underwent biopsy of all 3 breast masses as well as a right axillary lymph node. 2 of the tumors were similar invasive ductal carcinomas, grade 2, estrogen receptor 95% positive,  progesterone receptor 5% positive, with an MIB-1 of 10%, and HER-2 equivocal, with a signals ratio of 1.30 and the number per cell 4.0. The third tumor appeared's grade 1 or 2 was also estrogen receptor positive at 95%, progesterone receptor positive at 5%, with an MIP-1 of 5%, and a similar HER-2 profile the signals ratio being 1.46 but the number per cell being only 3.95. By immunohistochemistry on this tumor HER-2 was negative at 1+. Immunohistochemistry on the equivocal reading is pending.  [name]'s subsequent history is as detailed below  INTERVAL HISTORY: [name] returns today for follow-up of her estrogen receptor positive breast cancer, accompanied by her husband. Today she is due for cycle 8 of 12 weekly planned doses of paclitaxel.   REVIEW OF SYSTEMS: [name]'s UTI symptoms resolved with cipro prescribed last week. Her complaints today are fatigue and increased back and hip pain. She tends to ache more during bad weather. She continues to manage her pain with narcotics, and she ambulates with a cane. She denies fevers, chills, nausea, vomiting, or changes in bowel or bladder habits. She has no mouth sores, rashes, or neuropathy symptoms. She continues on gabapentin and restoril for sleep QHS. A detailed review of systems is otherwise stable.  PAST MEDICAL HISTORY: Past Medical History  Diagnosis Date  . Osteoarthritis   . Headache(784.0)     migraine like per patient  . Hypertension   . Low back pain     dr phillips, pain management  . Degenerative joint disease   . Gallstones   . GERD (gastroesophageal reflux disease)   . DDD (degenerative disc disease), cervical   . Colon polyps   . Diverticulosis   .  Diabetes mellitus without complication ([place])   . Breast cancer of lower-inner quadrant of right female breast ([place]) 08/08/2015  . Breast cancer ([place])     PAST SURGICAL HISTORY: Past Surgical History  Procedure Laterality Date  . Replacement total knee Right     right - dr  Novella Olive  . Ganglion cyst excision Right     right wrist  . Lipoma excision Right 02/21/08    right hip, [facility]  . Replacement total knee Left 06/25/09    left - dr Marciano Sequin [facility]  . Tonsillectomy    . Joint replacement      both knees have been done, four times each per patient. Her body rejects the implant, no infection.  . Abdominal hysterectomy  06/22/06    lap, dr Mancel Bale  . Other surgical history      lipoma removed from back 2012   . Cholecystectomy  10/28/2011    Procedure: LAPAROSCOPIC CHOLECYSTECTOMY WITH INTRAOPERATIVE CHOLANGIOGRAM;  Surgeon: Earnstine Regal, MD;  Location: [facility];  Service: General;  Laterality: N/A;  . Mass excision  08/24/2012    Procedure: EXCISION MASS;  Surgeon: Earnstine Regal, MD;  Location: [facility];  Service: General;  Laterality: Left;  excisie soft tissue masses left shoulder(back) & left upper arm  . Colonoscopy  02-03-13    per Dr. Henrene Pastor, diverticuli but no polyps, repeat in 5 yrs   . Mass excision Left 01/24/2014    Procedure: EXCISION SOFT TISSUE MASSES LOWER LEFT BACK;  Surgeon: Earnstine Regal, MD;  Location: [facility];  Service: General;  Laterality: Left;  . Esophagogastroduodenoscopy  02-03-13    per Dr. Henrene Pastor, normal   . Cardiac catheterization      back in 200 by Dr. Stanford Breed  . Tonsillectomy    . Mastectomy w/ sentinel node biopsy Right 10/02/2015  . Mastectomy w/ sentinel node biopsy Right 10/01/2015    Procedure: RIGHT MASTECTOMY WITH SENTINEL LYMPH NODE BIOPSY;  Surgeon: Autumn Messing III, MD;  Location: [facility];  Service: General;  Laterality: Right;  . Portacath placement Left 11/01/2015    Procedure: INSERTION PORT-A-CATH;  Surgeon: Autumn Messing III, MD;  Location: [facility];  Service: General;  Laterality: Left;    FAMILY HISTORY Family History  Problem Relation Age of Onset  . Crohn's disease Other     nephew  . Stomach cancer Mother   . Heart disease Father   . Prostate cancer Father   . Kidney  cancer Sister     one out of the four  . Hypertension Sister   . Hypertension Brother   . Colon cancer Maternal Grandmother   The patient's father died at the age of 36 with congestive heart failure. Her mother is still living as of November 2016, age 23. The patient had 6 brothers, 4 sisters. One sister was diagnosed with kidney cancer at age 81. ([place]). She is doing well. The patient's maternal grandmother was diagnosed with colon cancer at the age of 27. The patient's mother was diagnosed with a "rare stomach cancer" 20 years ago. The patient's father was diagnosed with prostate cancer at age 15. There is no history of breast or ovarian cancer in the family to her knowledge.   GYNECOLOGIC HISTORY:  No LMP recorded. Patient has had a hysterectomy.  menarche age 76, first live birth age 62, the patient is GX P3. She underwent remote hysterectomy with bilateral salpingo-oophorectomy. She did not use hormone replacement. She  never used oral contraceptives.   SOCIAL HISTORY:  Marzetta used to work as a "[place] but she is now disabled because of her multiple arthritic and degenerative problems. Her husband Elberta Fortis A. Gural works as a Freight forwarder. Son Dalphine Handing works in long care in [place]. Son Evette Doffing works for a Arboriculturist in [place]. Daughter  Silva Bandy is a Haematologist in [place]. The patient has 5 grandchildren. She attends a Physicist, medical church    ADVANCED DIRECTIVES: Not in place   HEALTH MAINTENANCE: Social History  Substance Use Topics  . Smoking status: Current Every Day Smoker -- 0.25 packs/day for 20 years    Types: Cigarettes  . Smokeless tobacco: Former Systems developer    Quit date: 09/30/2015     Comment: quit  . Alcohol Use: No     Colonoscopy:February 2016   FIE:PPIRJJ post hysterectomy   Bone density:  Lipid panel:  Allergies  Allergen Reactions  . Codeine Itching and Nausea And Vomiting    Itching all over  the body  . Latex Hives  . Sulfonamide Derivatives Hives    All over the body    Current Outpatient Prescriptions  Medication Sig Dispense Refill  . amitriptyline (ELAVIL) 25 MG tablet Take 25 mg by mouth at bedtime.     Marland Kitchen amLODipine (NORVASC) 5 MG tablet Take 1 tablet (5 mg total) by mouth 2 (two) times daily. 180 tablet 3  . ciprofloxacin (CIPRO) 500 MG tablet Take 1 tablet (500 mg total) by mouth 2 (two) times daily. 10 tablet 0  . furosemide (LASIX) 20 MG tablet TAKE 1 TABLET BY MOUTH EVERY DAY 30 tablet 6  . gabapentin (NEURONTIN) 300 MG capsule Take 1 capsule (300 mg total) by mouth at bedtime. 90 capsule 4  . lidocaine-prilocaine (EMLA) cream Apply 1 application topically as needed. 30 g 0  . lisinopril (PRINIVIL,ZESTRIL) 10 MG tablet Take 1 tablet (10 mg total) by mouth daily. 90 tablet 3  . metFORMIN (GLUCOPHAGE) 500 MG tablet Take 1 tablet (500 mg total) by mouth 2 (two) times daily with a meal. 180 tablet 3  . methadone (DOLOPHINE) 10 MG tablet Take 10 mg by mouth every 8 (eight) hours as needed for moderate pain.     . metoprolol (LOPRESSOR) 50 MG tablet Take 100 mg by mouth 2 (two) times daily.    Marland Kitchen omeprazole (PRILOSEC) 40 MG capsule Take 1 capsule (40 mg total) by mouth daily. 90 capsule 3  . ondansetron (ZOFRAN) 8 MG tablet Reported on 01/28/2016  1  . oxyCODONE (ROXICODONE) 15 MG immediate release tablet Take 15 mg by mouth every 6 (six) hours as needed for pain. Reported on 12/10/2015    . potassium chloride (K-DUR) 10 MEQ tablet Take 3 tablets (30 mEq total) by mouth 2 (two) times daily. 180 tablet 11  . temazepam (RESTORIL) 30 MG capsule TAKE ONE CAPSULE BY MOUTH AT BEDTIME AS NEEDED FOR SLEEP 90 capsule 1   No current facility-administered medications for this visit.    OBJECTIVE: middle-aged African-American woman Who appears stated age  55 Vitals:   02/25/16 0920  BP: 134/82  Pulse: 81  Temp: 98.4 F (36.9 C)  Resp: 18     Body mass index is 41.37 kg/(m^2).     ECOG FS:1 - Symptomatic but completely ambulatory  Sclerae unicteric, pupils round and equal Oropharynx clear and moist-- no thrush or other lesions No cervical or supraclavicular adenopathy Lungs no rales or rhonchi Heart regular rate and  rhythm Abd soft, nontender, positive bowel sounds MSK no focal spinal tenderness, no upper extremity lymphedema Neuro: nonfocal, well oriented, appropriate affect Breasts: deferred  LAB RESULTS:  CMP     Component Value Date/Time   NA 143 02/25/2016 0903   NA 140 01/09/2016 1841   K 3.4* 02/25/2016 0903   K 2.7* 01/09/2016 1841   CL 104 01/09/2016 1841   CO2 27 02/25/2016 0903   CO2 24 01/09/2016 1841   GLUCOSE 145* 02/25/2016 0903   GLUCOSE 144* 01/09/2016 1841   BUN 12.1 02/25/2016 0903   BUN 15 01/09/2016 1841   CREATININE 0.9 02/25/2016 0903   CREATININE 0.74 01/09/2016 1841   CALCIUM 9.2 02/25/2016 0903   CALCIUM 9.1 01/09/2016 1841   PROT 6.4 02/25/2016 0903   PROT 7.7 06/05/2015 1837   ALBUMIN 3.6 02/25/2016 0903   ALBUMIN 4.8 06/05/2015 1837   AST 15 02/25/2016 0903   AST 24 06/05/2015 1837   ALT 12 02/25/2016 0903   ALT 14 06/05/2015 1837   ALKPHOS 61 02/25/2016 0903   ALKPHOS 69 06/05/2015 1837   BILITOT 0.39 02/25/2016 0903   BILITOT 0.5 06/05/2015 1837   GFRNONAA >60 01/09/2016 1841   GFRAA >60 01/09/2016 1841    INo results found for: SPEP, UPEP  Lab Results  Component Value Date   WBC 5.5 02/25/2016   NEUTROABS 3.5 02/25/2016   HGB 11.1* 02/25/2016   HCT 33.6* 02/25/2016   MCV 95.5 02/25/2016   PLT 223 02/25/2016      Chemistry      Component Value Date/Time   NA 143 02/25/2016 0903   NA 140 01/09/2016 1841   K 3.4* 02/25/2016 0903   K 2.7* 01/09/2016 1841   CL 104 01/09/2016 1841   CO2 27 02/25/2016 0903   CO2 24 01/09/2016 1841   BUN 12.1 02/25/2016 0903   BUN 15 01/09/2016 1841   CREATININE 0.9 02/25/2016 0903   CREATININE 0.74 01/09/2016 1841      Component Value Date/Time   CALCIUM 9.2  02/25/2016 0903   CALCIUM 9.1 01/09/2016 1841   ALKPHOS 61 02/25/2016 0903   ALKPHOS 69 06/05/2015 1837   AST 15 02/25/2016 0903   AST 24 06/05/2015 1837   ALT 12 02/25/2016 0903   ALT 14 06/05/2015 1837   BILITOT 0.39 02/25/2016 0903   BILITOT 0.5 06/05/2015 1837       No results found for: LABCA2  No components found for: GBEEF007  No results for input(s): INR in the last 168 hours.  Urinalysis    Component Value Date/Time   COLORURINE YELLOW 01/09/2016 1948   APPEARANCEUR CLEAR 01/09/2016 1948   LABSPEC 1.010 02/19/2016 1355   LABSPEC 1.020 01/09/2016 1948   PHURINE 6.0 02/19/2016 1355   PHURINE 6.0 01/09/2016 1948   GLUCOSEU Negative 02/19/2016 1355   GLUCOSEU NEGATIVE 01/09/2016 1948   HGBUR Trace 02/19/2016 1355   HGBUR TRACE* 01/09/2016 1948   HGBUR large 07/01/2010 0945   BILIRUBINUR Negative 02/19/2016 1355   BILIRUBINUR NEGATIVE 01/09/2016 1948   BILIRUBINUR n 02/26/2015 1121   KETONESUR Negative 02/19/2016 [place] 01/09/2016 1948   PROTEINUR Negative 02/19/2016 1355   PROTEINUR NEGATIVE 01/09/2016 1948   PROTEINUR n 02/26/2015 1121   UROBILINOGEN 0.2 02/19/2016 1355   UROBILINOGEN 0.2 06/05/2015 2353   UROBILINOGEN 0.2 02/26/2015 1121   NITRITE Negative 02/19/2016 1355   NITRITE NEGATIVE 01/09/2016 1948   NITRITE n 02/26/2015 1121   LEUKOCYTESUR Small 02/19/2016 1355   LEUKOCYTESUR  MODERATE* 01/09/2016 1948   STUDIES: No results found.  ASSESSMENT: 55 y.o. [place] woman status post right breast biopsy 3 and axillary lymph node biopsy 08/02/2015 for a clinical mpT1c N0, s invasive ductal carcinoma, grade 1 or 2, estrogen receptor 95% positive, progesterone receptor 5% positive, with an MIB-1 between 5 and 10%, and HER-2 equivocal on 1 of the 2 biopsies (signals ratio 1.30, number per cell 4.0).tage IA   (1) status post right modified radical mastectomy 10/01/2015 for an mpT2 pN0, stage IIA invasive ductal carcinoma, grade 2, with  a total of 20 benign lymph nodes removed  (2) Oncotype DX score of 32 predicts an outside the breast recurrence risk of 22% within 10 years if the patient's only systemic therapy is tamoxifen for 5 years. It also predicts significant benefit from adjuvant chemotherapy.  (3) patient completed adjuvant doxorubicin and cyclophosphamide in dose dense fashion 4, last dose 12/24/2015, to be followed as tolerated by paclitaxel weekly 12 Starting 01/07/2016   (4) postmastectomy radiation to follow   (5) anti-estrogens to follow completion of local treatment  (a) consider PALLAS  Study  (b) consider BWEL study  PLAN: Karimah continues to tolerate treatment well. The labs were reviewed in detail and were stable. She will proceed with cycle 8 of paclitaxel as planned today.  Layce will return in 1 week for cycle 9 of treatment. She understands and agrees with this plan. She knows the goal of treatment in her case is cure. She has been encouraged to call with any issues that might arise before her next visit here.    Laurie Panda, NP   02/25/2016 9:51 AM

## 2016-02-25 NOTE — Patient Instructions (Signed)
[facility] Discharge Instructions for Patients Receiving Chemotherapy  Today you received the following chemotherapy agents Taxol  To help prevent nausea and vomiting after your treatment, we encourage you to take your nausea medication   If you develop nausea and vomiting that is not controlled by your nausea medication, call the clinic.   BELOW ARE SYMPTOMS THAT SHOULD BE REPORTED IMMEDIATELY:  *FEVER GREATER THAN 100.5 F  *CHILLS WITH OR WITHOUT FEVER  NAUSEA AND VOMITING THAT IS NOT CONTROLLED WITH YOUR NAUSEA MEDICATION  *UNUSUAL SHORTNESS OF BREATH  *UNUSUAL BRUISING OR BLEEDING  TENDERNESS IN MOUTH AND THROAT WITH OR WITHOUT PRESENCE OF ULCERS  *URINARY PROBLEMS  *BOWEL PROBLEMS  UNUSUAL RASH Items with * indicate a potential emergency and should be followed up as soon as possible.  Feel free to call the clinic you have any questions or concerns. The clinic phone number is [phone].  Please show the CHEMO ALERT CARD at check-in to the Emergency Department and triage nurse.   

## 2016-03-03 ENCOUNTER — Other Ambulatory Visit (HOSPITAL_BASED_OUTPATIENT_CLINIC_OR_DEPARTMENT_OTHER): Payer: 59

## 2016-03-03 ENCOUNTER — Ambulatory Visit (HOSPITAL_BASED_OUTPATIENT_CLINIC_OR_DEPARTMENT_OTHER): Payer: 59

## 2016-03-03 ENCOUNTER — Other Ambulatory Visit: Payer: Self-pay | Admitting: Oncology

## 2016-03-03 ENCOUNTER — Encounter: Payer: Self-pay | Admitting: Nurse Practitioner

## 2016-03-03 ENCOUNTER — Ambulatory Visit (HOSPITAL_BASED_OUTPATIENT_CLINIC_OR_DEPARTMENT_OTHER): Payer: 59 | Admitting: Nurse Practitioner

## 2016-03-03 VITALS — BP 137/87 | HR 59 | Temp 98.5°F | Resp 18 | Wt 259.6 lb

## 2016-03-03 DIAGNOSIS — Z170 Estrogen receptor positive status [ER+]: Secondary | ICD-10-CM

## 2016-03-03 DIAGNOSIS — C50311 Malignant neoplasm of lower-inner quadrant of right female breast: Secondary | ICD-10-CM | POA: Diagnosis not present

## 2016-03-03 DIAGNOSIS — Z5111 Encounter for antineoplastic chemotherapy: Secondary | ICD-10-CM | POA: Diagnosis not present

## 2016-03-03 LAB — COMPREHENSIVE METABOLIC PANEL
ALT: 14 U/L (ref 0–55)
AST: 19 U/L (ref 5–34)
Albumin: 3.8 g/dL (ref 3.5–5.0)
Alkaline Phosphatase: 60 U/L (ref 40–150)
Anion Gap: 10 mEq/L (ref 3–11)
BUN: 10.5 mg/dL (ref 7.0–26.0)
CO2: 25 mEq/L (ref 22–29)
Calcium: 9.2 mg/dL (ref 8.4–10.4)
Chloride: 105 mEq/L (ref 98–109)
Creatinine: 0.8 mg/dL (ref 0.6–1.1)
EGFR: 90 mL/min/{1.73_m2} — ABNORMAL LOW (ref >90)
Glucose: 114 mg/dl (ref 70–140)
Potassium: 3.4 mEq/L — ABNORMAL LOW (ref 3.5–5.1)
Sodium: 140 mEq/L (ref 136–145)
Total Bilirubin: <0.3 mg/dL (ref 0.20–1.20)
Total Protein: 6.9 g/dL (ref 6.4–8.3)

## 2016-03-03 LAB — CBC WITH DIFFERENTIAL/PLATELET
BASO%: 0.3 % (ref 0.0–2.0)
Basophils Absolute: 0 10*3/uL (ref 0.0–0.1)
EOS%: 2.9 % (ref 0.0–7.0)
Eosinophils Absolute: 0.2 10*3/uL (ref 0.0–0.5)
HCT: 35.5 % (ref 34.8–46.6)
HGB: 11.6 g/dL (ref 11.6–15.9)
LYMPH%: 30.3 % (ref 14.0–49.7)
MCH: 31.5 pg (ref 25.1–34.0)
MCHC: 32.7 g/dL (ref 31.5–36.0)
MCV: 96.5 fL (ref 79.5–101.0)
MONO#: 0.3 10*3/uL (ref 0.1–0.9)
MONO%: 5.3 % (ref 0.0–14.0)
NEUT#: 3.8 10*3/uL (ref 1.5–6.5)
NEUT%: 61.2 % (ref 38.4–76.8)
Platelets: 354 10*3/uL (ref 145–400)
RBC: 3.68 10*6/uL — ABNORMAL LOW (ref 3.70–5.45)
RDW: 15.1 % — ABNORMAL HIGH (ref 11.2–14.5)
WBC: 6.2 10*3/uL (ref 3.9–10.3)
lymph#: 1.9 10*3/uL (ref 0.9–3.3)
nRBC: 1 % — ABNORMAL HIGH (ref 0–0)

## 2016-03-03 MED ORDER — SODIUM CHLORIDE 0.9 % IV SOLN
4.0000 mg | Freq: Once | INTRAVENOUS | Status: AC
  Administered 2016-03-03: 4 mg via INTRAVENOUS
  Filled 2016-03-03: qty 0.4

## 2016-03-03 MED ORDER — DIPHENHYDRAMINE HCL 50 MG/ML IJ SOLN
INTRAMUSCULAR | Status: AC
  Filled 2016-03-03: qty 1

## 2016-03-03 MED ORDER — DIPHENHYDRAMINE HCL 50 MG/ML IJ SOLN
25.0000 mg | Freq: Once | INTRAMUSCULAR | Status: AC
  Administered 2016-03-03: 25 mg via INTRAVENOUS

## 2016-03-03 MED ORDER — SODIUM CHLORIDE 0.9 % IV SOLN
80.0000 mg/m2 | Freq: Once | INTRAVENOUS | Status: AC
  Administered 2016-03-03: 186 mg via INTRAVENOUS
  Filled 2016-03-03: qty 31

## 2016-03-03 MED ORDER — SODIUM CHLORIDE 0.9 % IV SOLN
Freq: Once | INTRAVENOUS | Status: AC
Start: 2016-03-03 — End: 2016-03-03
  Administered 2016-03-03: 10:00:00 via INTRAVENOUS

## 2016-03-03 MED ORDER — FAMOTIDINE IN NACL 20-0.9 MG/50ML-% IV SOLN
20.0000 mg | Freq: Once | INTRAVENOUS | Status: AC
Start: 2016-03-03 — End: 2016-03-03
  Administered 2016-03-03: 20 mg via INTRAVENOUS

## 2016-03-03 MED ORDER — HEPARIN SOD (PORK) LOCK FLUSH 100 UNIT/ML IV SOLN
500.0000 [IU] | Freq: Once | INTRAVENOUS | Status: AC | PRN
  Administered 2016-03-03: 500 [IU]
  Filled 2016-03-03: qty 5

## 2016-03-03 MED ORDER — FAMOTIDINE IN NACL 20-0.9 MG/50ML-% IV SOLN
INTRAVENOUS | Status: AC
  Filled 2016-03-03: qty 50

## 2016-03-03 MED ORDER — SODIUM CHLORIDE 0.9% FLUSH
10.0000 mL | INTRAVENOUS | Status: DC | PRN
  Administered 2016-03-03: 10 mL
  Filled 2016-03-03: qty 10

## 2016-03-03 NOTE — Progress Notes (Signed)
[name]  [phone] [phone]) [phone]   ID: [name] DOB: [birth year]  MR#: [id number]  [name]  Patient Care Team: Diane Morale, [name] as PCP - Diane III, [name] as Consulting Physician (General Surgery) Diane Cruel, [name] as Consulting Physician (Oncology) Diane Silversmith, [name] as Consulting Physician (Radiation Oncology) Diane Cheese, NP as Nurse Practitioner (Hematology and Oncology) PCP: Diane Morale, [name] GYN: OTHER [name]: [name], [name]  CHIEF COMPLAINT: Multicentric breast cancer  CURRENT TREATMENT: Adjuvant chemotherapy  BREAST CANCER HISTORY: From the original intake note:  [name] herself noted a change in her right breast in October and brought it to the attention of her primary care physician. Her last mammogram had been April 2010. [name] up for bilateral diagnostic mammography with tomosynthesis and [facility] November 04/11/2015. This found the breast density to be category B. In the right breast there were 3 microlobulated masses in the lower inner quadrant measuring 1.9, 1.5, and 1.5 cm. These were multicentric. There were also diffuse fine pleomorphic calcifications surrounding these masses, that area spanning 11.6 cm. The masses were palpable by exam at 4:00 8 cm from the nipple and at 5:00 3 cm from the nipple. By ultrasonography there was an irregular hypoechoic mass in the right breast at 4:00 measuring 1.7 cm, 1 medial to this measuring 1.1 cm, and then the third irregular hypoechoic mass at the 5:00 position measuring 1.3 cm. The right axilla showed multiple lymph nodes with thickened cortices. The largest lymph node measured 0.9 cm.  On 08/02/2015, [name] underwent biopsy of all 3 breast masses as well as a right axillary lymph node. 2 of the tumors were similar invasive ductal carcinomas, grade 2, estrogen receptor 95% positive,  progesterone receptor 5% positive, with an MIB-1 of 10%, and HER-2 equivocal, with a signals ratio of 1.30 and the number per cell 4.0. The third tumor appeared's grade 1 or 2 was also estrogen receptor positive at 95%, progesterone receptor positive at 5%, with an MIP-1 of 5%, and a similar HER-2 profile the signals ratio being 1.46 but the number per cell being only 3.95. By immunohistochemistry on this tumor HER-2 was negative at 1+. Immunohistochemistry on the equivocal reading is pending.  [name]'s subsequent history is as detailed below  INTERVAL HISTORY: [name] returns today for follow-up of her estrogen receptor positive breast cancer, accompanied by her husband. Today she is due for cycle 9 of 12 weekly planned doses of paclitaxel.   REVIEW OF SYSTEMS: [name] is not sleeping well. She continues on gabapentin and restoril QHS, but is up several times nightly despite this. She is fatigued during the day as a result. Her chronic pain has been worse while on treatment. She is managed with narcotics given by a pain clinic. She ambulates with a cane. She denies fevers, chills, nausea, vomiting, or changes in bowel or bladder habits. She has no mouth sores, rashes, or neuropathy symptoms. A detailed review of systems is otherwise stable.  PAST MEDICAL HISTORY: Past Medical History  Diagnosis Date  . Osteoarthritis   . Headache(784.0)     migraine like per patient  . Hypertension   . Low back pain     dr [name], pain management  . Degenerative joint disease   . Gallstones   . GERD (gastroesophageal reflux disease)   . DDD (degenerative disc disease), cervical   . Colon polyps   . Diverticulosis   .  Diabetes mellitus without complication ([name])   . Breast cancer of lower-inner quadrant of right female breast ([name]) 08/08/2015  . Breast cancer ([place])     PAST SURGICAL HISTORY: Past Surgical History  Procedure Laterality Date  . Replacement total knee Right     right - dr [name]  .  Ganglion cyst excision Right     right wrist  . Lipoma excision Right 02/21/08    right hip, [name]  . Replacement total knee Left 06/25/09    left - dr [name] [name]  . Tonsillectomy    . Joint replacement      both knees have been done, four times each per patient. Her body rejects the implant, no infection.  . Abdominal hysterectomy  06/22/06    lap, dr [name]  . Other surgical history      lipoma removed from back 2012   . Cholecystectomy  10/28/2011    Procedure: LAPAROSCOPIC CHOLECYSTECTOMY WITH INTRAOPERATIVE CHOLANGIOGRAM;  Surgeon: Diane Regal, [name];  Location: [facility];  Service: General;  Laterality: N/A;  . Mass excision  08/24/2012    Procedure: EXCISION MASS;  Surgeon: Diane Regal, [name];  Location: [facility];  Service: General;  Laterality: Left;  excisie soft tissue masses left shoulder(back) & left upper arm  . Colonoscopy  02-03-13    per Dr. Henrene [name], diverticuli but no polyps, repeat in 5 yrs   . Mass excision Left 01/24/2014    Procedure: EXCISION SOFT TISSUE MASSES LOWER LEFT BACK;  Surgeon: Diane Regal, [name];  Location: [facility];  Service: General;  Laterality: Left;  . Esophagogastroduodenoscopy  02-03-13    per Dr. Henrene [name], normal   . Cardiac catheterization      back in 200 by Dr. Stanford [name]  . Tonsillectomy    . Mastectomy w/ sentinel node biopsy Right 10/02/2015  . Mastectomy w/ sentinel node biopsy Right 10/01/2015    Procedure: RIGHT MASTECTOMY WITH SENTINEL LYMPH NODE BIOPSY;  Surgeon: Diane Messing III, [name];  Location: [facility];  Service: General;  Laterality: Right;  . Portacath placement Left 11/01/2015    Procedure: INSERTION PORT-A-CATH;  Surgeon: Diane Messing III, [name];  Location: [facility] [place];  Service: General;  Laterality: Left;    FAMILY HISTORY Family History  Problem Relation Age of Onset  . Crohn's disease Other     nephew  . Stomach cancer Mother   . Heart disease Father   . Prostate cancer Father   . Kidney cancer Sister      one out of the four  . Hypertension Sister   . Hypertension Brother   . Colon cancer Maternal Grandmother   The patient's father died at the age of 9 with congestive heart failure. Her mother is still living as of November 2016, age 42. The patient had 6 brothers, 4 sisters. One sister was diagnosed with kidney cancer at age 71. ([place]). She is doing well. The patient's maternal grandmother was diagnosed with colon cancer at the age of 44. The patient's mother was diagnosed with a "rare stomach cancer" 20 years ago. The patient's father was diagnosed with prostate cancer at age 86. There is no history of breast or ovarian cancer in the family to her knowledge.   GYNECOLOGIC HISTORY:  No LMP recorded. Patient has had a hysterectomy.  menarche age 47, first live birth age 74, the patient is GX P3. She underwent remote hysterectomy with bilateral salpingo-oophorectomy. She did not use hormone replacement. She  never used oral contraceptives.   SOCIAL HISTORY:  [name] used to work as a "[employer] but she is now disabled because of her multiple arthritic and degenerative problems. Her husband [name] works as a Freight forwarder. Son [name] works in long care in [place]. Son [name] works for a Arboriculturist in [place]. Daughter  [name] is a Haematologist in [place]. The patient has 5 grandchildren. She attends a Physicist, medical church    ADVANCED DIRECTIVES: Not in place   HEALTH MAINTENANCE: Social History  Substance Use Topics  . Smoking status: Current Every Day Smoker -- 0.25 packs/day for 20 years    Types: Cigarettes  . Smokeless tobacco: Former Systems developer    Quit date: 09/30/2015     Comment: quit  . Alcohol Use: No     Colonoscopy:February 2016   NUU:VOZDGU post hysterectomy   Bone density:  Lipid panel:  Allergies  Allergen Reactions  . Codeine Itching and Nausea And Vomiting    Itching all over the body  .  Latex Hives  . Sulfonamide Derivatives Hives    All over the body    Current Outpatient Prescriptions  Medication Sig Dispense Refill  . amitriptyline (ELAVIL) 25 MG tablet Take 25 mg by mouth at bedtime.     Marland Kitchen amLODipine (NORVASC) 5 MG tablet Take 1 tablet (5 mg total) by mouth 2 (two) times daily. 180 tablet 3  . furosemide (LASIX) 20 MG tablet TAKE 1 TABLET BY MOUTH EVERY DAY 30 tablet 6  . gabapentin (NEURONTIN) 300 MG capsule Take 1 capsule (300 mg total) by mouth at bedtime. 90 capsule 4  . lidocaine-prilocaine (EMLA) cream Apply 1 application topically as needed. 30 g 0  . lisinopril (PRINIVIL,ZESTRIL) 10 MG tablet TAKE 1 TABLET(10 MG) BY MOUTH DAILY 90 tablet 0  . metFORMIN (GLUCOPHAGE) 500 MG tablet TAKE 1 TABLET(500 MG) BY MOUTH TWICE DAILY WITH A MEAL 180 tablet 0  . methadone (DOLOPHINE) 10 MG tablet Take 10 mg by mouth every 8 (eight) hours as needed for moderate pain.     . metoprolol (LOPRESSOR) 50 MG tablet Take 100 mg by mouth 2 (two) times daily.    Marland Kitchen omeprazole (PRILOSEC) 40 MG capsule TAKE 1 CAPSULE(40 MG) BY MOUTH DAILY 90 capsule 0  . ondansetron (ZOFRAN) 8 MG tablet Reported on 01/28/2016  1  . oxyCODONE (ROXICODONE) 15 MG immediate release tablet Take 15 mg by mouth every 6 (six) hours as needed for pain. Reported on 12/10/2015    . potassium chloride (K-DUR) 10 MEQ tablet Take 3 tablets (30 mEq total) by mouth 2 (two) times daily. 180 tablet 11  . temazepam (RESTORIL) 30 MG capsule TAKE ONE CAPSULE BY MOUTH AT BEDTIME AS NEEDED FOR SLEEP 90 capsule 1   No current facility-administered medications for this visit.    OBJECTIVE: middle-aged African-American woman Who appears stated age  72 Vitals:   03/03/16 0927  BP: 137/87  Pulse: 59  Temp: 98.5 F (36.9 C)  Resp: 18     Body mass index is 40.65 kg/(m^2).    ECOG FS:1 - Symptomatic but completely ambulatory  Skin: warm, dry  HEENT: sclerae anicteric, conjunctivae pink, oropharynx clear. No thrush or  mucositis.  Lymph Nodes: No cervical or supraclavicular lymphadenopathy  Lungs: clear to auscultation bilaterally, no rales, wheezes, or rhonci  Heart: regular rate and rhythm  Abdomen: round, soft, non tender, positive bowel sounds  Musculoskeletal: No focal spinal tenderness, no peripheral edema  Neuro: non focal, well oriented, positive affect  Breasts: deferred  LAB RESULTS:  CMP     Component Value Date/Time   NA 143 02/25/2016 0903   NA 140 01/09/2016 1841   K 3.4* 02/25/2016 0903   K 2.7* 01/09/2016 1841   CL 104 01/09/2016 1841   CO2 27 02/25/2016 0903   CO2 24 01/09/2016 1841   GLUCOSE 145* 02/25/2016 0903   GLUCOSE 144* 01/09/2016 1841   BUN 12.1 02/25/2016 0903   BUN 15 01/09/2016 1841   CREATININE 0.9 02/25/2016 0903   CREATININE 0.74 01/09/2016 1841   CALCIUM 9.2 02/25/2016 0903   CALCIUM 9.1 01/09/2016 1841   PROT 6.4 02/25/2016 0903   PROT 7.7 06/05/2015 1837   ALBUMIN 3.6 02/25/2016 0903   ALBUMIN 4.8 06/05/2015 1837   AST 15 02/25/2016 0903   AST 24 06/05/2015 1837   ALT 12 02/25/2016 0903   ALT 14 06/05/2015 1837   ALKPHOS 61 02/25/2016 0903   ALKPHOS 69 06/05/2015 1837   BILITOT 0.39 02/25/2016 0903   BILITOT 0.5 06/05/2015 1837   GFRNONAA >60 01/09/2016 1841   GFRAA >60 01/09/2016 1841    INo results found for: SPEP, UPEP  Lab Results  Component Value Date   WBC 6.2 03/03/2016   NEUTROABS 3.8 03/03/2016   HGB 11.6 03/03/2016   HCT 35.5 03/03/2016   MCV 96.5 03/03/2016   PLT 354 03/03/2016      Chemistry      Component Value Date/Time   NA 143 02/25/2016 0903   NA 140 01/09/2016 1841   K 3.4* 02/25/2016 0903   K 2.7* 01/09/2016 1841   CL 104 01/09/2016 1841   CO2 27 02/25/2016 0903   CO2 24 01/09/2016 1841   BUN 12.1 02/25/2016 0903   BUN 15 01/09/2016 1841   CREATININE 0.9 02/25/2016 0903   CREATININE 0.74 01/09/2016 1841      Component Value Date/Time   CALCIUM 9.2 02/25/2016 0903   CALCIUM 9.1 01/09/2016 1841   ALKPHOS  61 02/25/2016 0903   ALKPHOS 69 06/05/2015 1837   AST 15 02/25/2016 0903   AST 24 06/05/2015 1837   ALT 12 02/25/2016 0903   ALT 14 06/05/2015 1837   BILITOT 0.39 02/25/2016 0903   BILITOT 0.5 06/05/2015 1837       No results found for: LABCA2  No components found for: [id number]  No results for input(s): INR in the last 168 hours.  Urinalysis    Component Value Date/Time   COLORURINE YELLOW 01/09/2016 1948   APPEARANCEUR CLEAR 01/09/2016 1948   LABSPEC 1.010 02/19/2016 1355   LABSPEC 1.020 01/09/2016 1948   PHURINE 6.0 02/19/2016 1355   PHURINE 6.0 01/09/2016 1948   GLUCOSEU Negative 02/19/2016 1355   GLUCOSEU NEGATIVE 01/09/2016 1948   HGBUR Trace 02/19/2016 1355   HGBUR TRACE* 01/09/2016 1948   HGBUR large 07/01/2010 0945   BILIRUBINUR Negative 02/19/2016 1355   BILIRUBINUR NEGATIVE 01/09/2016 1948   BILIRUBINUR n 02/26/2015 1121   KETONESUR Negative 02/19/2016 1355   [place] 01/09/2016 1948   PROTEINUR Negative 02/19/2016 1355   PROTEINUR NEGATIVE 01/09/2016 1948   PROTEINUR n 02/26/2015 1121   UROBILINOGEN 0.2 02/19/2016 1355   UROBILINOGEN 0.2 06/05/2015 2353   UROBILINOGEN 0.2 02/26/2015 1121   NITRITE Negative 02/19/2016 1355   NITRITE NEGATIVE 01/09/2016 1948   NITRITE n 02/26/2015 1121   LEUKOCYTESUR Small 02/19/2016 1355   LEUKOCYTESUR MODERATE* 01/09/2016 1948    STUDIES: No results found.  ASSESSMENT: 55 y.o.  [place] woman status post right breast biopsy 3 and axillary lymph node biopsy 08/02/2015 for a clinical mpT1c N0, s invasive ductal carcinoma, grade 1 or 2, estrogen receptor 95% positive, progesterone receptor 5% positive, with an MIB-1 between 5 and 10%, and HER-2 equivocal on 1 of the 2 biopsies (signals ratio 1.30, number per cell 4.0).tage IA   (1) status post right modified radical mastectomy 10/01/2015 for an mpT2 pN0, stage IIA invasive ductal carcinoma, grade 2, with a total of 20 benign lymph nodes removed  (2)  Oncotype DX score of 32 predicts an outside the breast recurrence risk of 22% within 10 years if the patient's only systemic therapy is tamoxifen for 5 years. It also predicts significant benefit from adjuvant chemotherapy.  (3) patient completed adjuvant doxorubicin and cyclophosphamide in dose dense fashion 4, last dose 12/24/2015, to be followed as tolerated by paclitaxel weekly 12 Starting 01/07/2016   (4) postmastectomy radiation to follow   (5) anti-estrogens to follow completion of local treatment  (a) consider PALLAS  Study  (b) consider BWEL study  PLAN: Artia has no new complaints to offer today. The labs were reviewed in detail and were stable. She will proceed with cycle 9 of paclitaxel as planned today.  Suesan will return in 1 week for cycle 10 of treatment. She understands and agrees with this plan. She knows the goal of treatment in her case is cure. She has been encouraged to call with any issues that might arise before her next visit here.    Laurie Panda, NP   03/03/2016 9:54 AM

## 2016-03-03 NOTE — Patient Instructions (Signed)
[facility] Discharge Instructions for Patients Receiving Chemotherapy  Today you received the following chemotherapy agents:  Taxol  To help prevent nausea and vomiting after your treatment, we encourage you to take your nausea medication as prescribed.   If you develop nausea and vomiting that is not controlled by your nausea medication, call the clinic.   BELOW ARE SYMPTOMS THAT SHOULD BE REPORTED IMMEDIATELY:  *FEVER GREATER THAN 100.5 F  *CHILLS WITH OR WITHOUT FEVER  NAUSEA AND VOMITING THAT IS NOT CONTROLLED WITH YOUR NAUSEA MEDICATION  *UNUSUAL SHORTNESS OF BREATH  *UNUSUAL BRUISING OR BLEEDING  TENDERNESS IN MOUTH AND THROAT WITH OR WITHOUT PRESENCE OF ULCERS  *URINARY PROBLEMS  *BOWEL PROBLEMS  UNUSUAL RASH Items with * indicate a potential emergency and should be followed up as soon as possible.  Feel free to call the clinic you have any questions or concerns. The clinic phone number is [phone].  Please show the CHEMO ALERT CARD at check-in to the Emergency Department and triage nurse.   

## 2016-03-10 ENCOUNTER — Encounter: Payer: Self-pay | Admitting: Oncology

## 2016-03-10 ENCOUNTER — Other Ambulatory Visit (HOSPITAL_BASED_OUTPATIENT_CLINIC_OR_DEPARTMENT_OTHER): Payer: 59

## 2016-03-10 ENCOUNTER — Ambulatory Visit (HOSPITAL_BASED_OUTPATIENT_CLINIC_OR_DEPARTMENT_OTHER): Payer: 59

## 2016-03-10 ENCOUNTER — Ambulatory Visit (HOSPITAL_BASED_OUTPATIENT_CLINIC_OR_DEPARTMENT_OTHER): Payer: 59 | Admitting: Oncology

## 2016-03-10 VITALS — BP 158/85 | HR 77 | Temp 97.5°F | Resp 18 | Ht 67.0 in | Wt 259.1 lb

## 2016-03-10 DIAGNOSIS — C50311 Malignant neoplasm of lower-inner quadrant of right female breast: Secondary | ICD-10-CM

## 2016-03-10 DIAGNOSIS — Z5111 Encounter for antineoplastic chemotherapy: Secondary | ICD-10-CM | POA: Diagnosis not present

## 2016-03-10 DIAGNOSIS — Z170 Estrogen receptor positive status [ER+]: Secondary | ICD-10-CM

## 2016-03-10 LAB — COMPREHENSIVE METABOLIC PANEL
ALT: 16 U/L (ref 0–55)
AST: 19 U/L (ref 5–34)
Albumin: 3.8 g/dL (ref 3.5–5.0)
Alkaline Phosphatase: 67 U/L (ref 40–150)
Anion Gap: 10 mEq/L (ref 3–11)
BUN: 10.6 mg/dL (ref 7.0–26.0)
CO2: 26 mEq/L (ref 22–29)
Calcium: 9.3 mg/dL (ref 8.4–10.4)
Chloride: 106 mEq/L (ref 98–109)
Creatinine: 0.8 mg/dL (ref 0.6–1.1)
EGFR: >90 mL/min/{1.73_m2} (ref >90)
Glucose: 110 mg/dl (ref 70–140)
Potassium: 3.3 mEq/L — ABNORMAL LOW (ref 3.5–5.1)
Sodium: 142 mEq/L (ref 136–145)
Total Bilirubin: <0.3 mg/dL (ref 0.20–1.20)
Total Protein: 7 g/dL (ref 6.4–8.3)

## 2016-03-10 LAB — CBC WITH DIFFERENTIAL/PLATELET
BASO%: 0.7 % (ref 0.0–2.0)
Basophils Absolute: 0 10*3/uL (ref 0.0–0.1)
EOS%: 2.1 % (ref 0.0–7.0)
Eosinophils Absolute: 0.1 10*3/uL (ref 0.0–0.5)
HCT: 34.5 % — ABNORMAL LOW (ref 34.8–46.6)
HGB: 11.6 g/dL (ref 11.6–15.9)
LYMPH%: 29.5 % (ref 14.0–49.7)
MCH: 32.2 pg (ref 25.1–34.0)
MCHC: 33.6 g/dL (ref 31.5–36.0)
MCV: 95.8 fL (ref 79.5–101.0)
MONO#: 0.4 10*3/uL (ref 0.1–0.9)
MONO%: 7.1 % (ref 0.0–14.0)
NEUT#: 3.8 10*3/uL (ref 1.5–6.5)
NEUT%: 60.6 % (ref 38.4–76.8)
Platelets: 329 10*3/uL (ref 145–400)
RBC: 3.6 10*6/uL — ABNORMAL LOW (ref 3.70–5.45)
RDW: 15 % — ABNORMAL HIGH (ref 11.2–14.5)
WBC: 6.2 10*3/uL (ref 3.9–10.3)
lymph#: 1.8 10*3/uL (ref 0.9–3.3)

## 2016-03-10 MED ORDER — HEPARIN SOD (PORK) LOCK FLUSH 100 UNIT/ML IV SOLN
500.0000 [IU] | Freq: Once | INTRAVENOUS | Status: AC | PRN
  Administered 2016-03-10: 500 [IU]
  Filled 2016-03-10: qty 5

## 2016-03-10 MED ORDER — FAMOTIDINE IN NACL 20-0.9 MG/50ML-% IV SOLN
INTRAVENOUS | Status: AC
  Filled 2016-03-10: qty 50

## 2016-03-10 MED ORDER — DIPHENHYDRAMINE HCL 50 MG/ML IJ SOLN
25.0000 mg | Freq: Once | INTRAMUSCULAR | Status: AC
  Administered 2016-03-10: 25 mg via INTRAVENOUS

## 2016-03-10 MED ORDER — DIPHENHYDRAMINE HCL 50 MG/ML IJ SOLN
INTRAMUSCULAR | Status: AC
  Filled 2016-03-10: qty 1

## 2016-03-10 MED ORDER — SODIUM CHLORIDE 0.9 % IV SOLN
Freq: Once | INTRAVENOUS | Status: AC
  Administered 2016-03-10: 12:00:00 via INTRAVENOUS

## 2016-03-10 MED ORDER — SODIUM CHLORIDE 0.9% FLUSH
10.0000 mL | INTRAVENOUS | Status: DC | PRN
  Administered 2016-03-10: 10 mL
  Filled 2016-03-10: qty 10

## 2016-03-10 MED ORDER — FAMOTIDINE IN NACL 20-0.9 MG/50ML-% IV SOLN
20.0000 mg | Freq: Once | INTRAVENOUS | Status: AC
  Administered 2016-03-10: 20 mg via INTRAVENOUS

## 2016-03-10 MED ORDER — SODIUM CHLORIDE 0.9 % IV SOLN
4.0000 mg | Freq: Once | INTRAVENOUS | Status: AC
  Administered 2016-03-10: 4 mg via INTRAVENOUS
  Filled 2016-03-10: qty 0.4

## 2016-03-10 MED ORDER — SODIUM CHLORIDE 0.9 % IV SOLN
80.0000 mg/m2 | Freq: Once | INTRAVENOUS | Status: AC
  Administered 2016-03-10: 186 mg via INTRAVENOUS
  Filled 2016-03-10: qty 31

## 2016-03-10 NOTE — Patient Instructions (Addendum)
FOLLOW INSTRUCTIONS ON POTASSIUM BOTTLE - TAKE 3 TABLETS TWICE A DAY. [place] [facility] Discharge Instructions for Patients Receiving Chemotherapy  Today you received the following chemotherapy agents: Taxol.  To help prevent nausea and vomiting after your treatment, we encourage you to take your nausea medication as directed.  If you develop nausea and vomiting that is not controlled by your nausea medication, call the clinic.   BELOW ARE SYMPTOMS THAT SHOULD BE REPORTED IMMEDIATELY:  *FEVER GREATER THAN 100.5 F  *CHILLS WITH OR WITHOUT FEVER  NAUSEA AND VOMITING THAT IS NOT CONTROLLED WITH YOUR NAUSEA MEDICATION  *UNUSUAL SHORTNESS OF BREATH  *UNUSUAL BRUISING OR BLEEDING  TENDERNESS IN MOUTH AND THROAT WITH OR WITHOUT PRESENCE OF ULCERS  *URINARY PROBLEMS  *BOWEL PROBLEMS  UNUSUAL RASH Items with * indicate a potential emergency and should be followed up as soon as possible.  Feel free to call the clinic you have any questions or concerns. The clinic phone number is [phone].  Please show the [place] at check-in to the Emergency Department and triage nurse.

## 2016-03-10 NOTE — Progress Notes (Signed)
[name]  [phone] [phone]   ID: [name] DOB: Apr 25, 1961  MR#: [id number]  ZSW#:[id number]  Patient Care Team: Diane Morale, MD as PCP - Diane III, MD as Consulting Physician (General Surgery) Diane Cruel, MD as Consulting Physician (Oncology) Diane Silversmith, MD as Consulting Physician (Radiation Oncology) Diane Cheese, NP as Nurse Practitioner (Hematology and Oncology) PCP: Diane Morale, MD GYN: OTHER MD: Diane Mainland MD, Diane Bloom MD  CHIEF COMPLAINT: Multicentric breast cancer  CURRENT TREATMENT: Adjuvant chemotherapy  BREAST CANCER HISTORY: From the original intake note:  [name] herself noted a change in her right breast in October and brought it to the attention of her primary care physician. Her last mammogram had been April 2010. Dr. Sarajane [name] set [name] up for bilateral diagnostic mammography with tomosynthesis and [facility] 04/11/2015. This found the breast density to be category B. In the right breast there were 3 microlobulated masses in the lower inner quadrant measuring 1.9, 1.5, and 1.5 cm. These were multicentric. There were also diffuse fine pleomorphic calcifications surrounding these masses, that area spanning 11.6 cm. The masses were palpable by exam at 4:00 8 cm from the nipple and at 5:00 3 cm from the nipple. By ultrasonography there was an irregular hypoechoic mass in the right breast at 4:00 measuring 1.7 cm, 1 medial to this measuring 1.1 cm, and then the third irregular hypoechoic mass at the 5:00 position measuring 1.3 cm. The right axilla showed multiple lymph nodes with thickened cortices. The largest lymph node measured 0.9 cm.  On 08/02/2015, [name] underwent biopsy of all 3 breast masses as well as a right axillary lymph node. 2 of the tumors were similar invasive ductal carcinomas, grade 2, estrogen receptor 95% positive,  progesterone receptor 5% positive, with an MIB-1 of 10%, and HER-2 equivocal, with a signals ratio of 1.30 and the number per cell 4.0. The third tumor appeared's grade 1 or 2 was also estrogen receptor positive at 95%, progesterone receptor positive at 5%, with an MIP-1 of 5%, and a similar HER-2 profile the signals ratio being 1.46 but the number per cell being only 3.95. By immunohistochemistry on this tumor HER-2 was negative at 1+. Immunohistochemistry on the equivocal reading is pending.  [name]'s subsequent history is as detailed below  INTERVAL HISTORY: [name] returns today for follow-up of her estrogen receptor positive breast cancer, accompanied by her husband and friend. Today she is due for cycle 10 of 12 weekly planned doses of paclitaxel.   REVIEW OF SYSTEMS: [name] is not sleeping well. Her chronic pain has been worse while on treatment. She is managed with narcotics given by a pain clinic. She ambulates with a cane. She denies fevers, chills, nausea, vomiting, or changes in bowel or bladder habits. She has no mouth sores, rashes, or neuropathy symptoms. A detailed review of systems is otherwise stable.  PAST MEDICAL HISTORY: Past Medical History  Diagnosis Date  . Osteoarthritis   . Headache(784.0)     migraine like per patient  . Hypertension   . Low back pain     dr phillips, pain management  . Degenerative joint disease   . Gallstones   . GERD (gastroesophageal reflux disease)   . DDD (degenerative disc disease), cervical   . Colon polyps   . Diverticulosis   . Diabetes mellitus without complication ([place])   . Breast cancer of lower-inner quadrant of right female breast ([place]) 08/08/2015  .  Breast cancer ([place])     PAST SURGICAL HISTORY: Past Surgical History  Procedure Laterality Date  . Replacement total knee Right     right - dr Novella Olive  . Ganglion cyst excision Right     right wrist  . Lipoma excision Right 02/21/08    right hip, [facility]  . Replacement total  knee Left 06/25/09    left - dr Marciano Sequin [facility]  . Tonsillectomy    . Joint replacement      both knees have been done, four times each per patient. Her body rejects the implant, no infection.  . Abdominal hysterectomy  06/22/06    lap, dr Mancel Bale  . Other surgical history      lipoma removed from back 2012   . Cholecystectomy  10/28/2011    Procedure: LAPAROSCOPIC CHOLECYSTECTOMY WITH INTRAOPERATIVE CHOLANGIOGRAM;  Surgeon: Earnstine Regal, MD;  Location: [facility];  Service: General;  Laterality: N/A;  . Mass excision  08/24/2012    Procedure: EXCISION MASS;  Surgeon: Earnstine Regal, MD;  Location: [facility];  Service: General;  Laterality: Left;  excisie soft tissue masses left shoulder(back) & left upper arm  . Colonoscopy  02-03-13    per Dr. Henrene Pastor, diverticuli but no polyps, repeat in 5 yrs   . Mass excision Left 01/24/2014    Procedure: EXCISION SOFT TISSUE MASSES LOWER LEFT BACK;  Surgeon: Earnstine Regal, MD;  Location: [facility];  Service: General;  Laterality: Left;  . Esophagogastroduodenoscopy  02-03-13    per Dr. Henrene Pastor, normal   . Cardiac catheterization      back in 200 by Dr. Stanford Breed  . Tonsillectomy    . Mastectomy w/ sentinel node biopsy Right 10/02/2015  . Mastectomy w/ sentinel node biopsy Right 10/01/2015    Procedure: RIGHT MASTECTOMY WITH SENTINEL LYMPH NODE BIOPSY;  Surgeon: Autumn Messing III, MD;  Location: [facility];  Service: General;  Laterality: Right;  . Portacath placement Left 11/01/2015    Procedure: INSERTION PORT-A-CATH;  Surgeon: Autumn Messing III, MD;  Location: [facility];  Service: General;  Laterality: Left;    FAMILY HISTORY Family History  Problem Relation Age of Onset  . Crohn's disease Other     nephew  . Stomach cancer Mother   . Heart disease Father   . Prostate cancer Father   . Kidney cancer Sister     one out of the four  . Hypertension Sister   . Hypertension Brother   . Colon cancer Maternal Grandmother   The  patient's father died at the age of 39 with congestive heart failure. Her mother is still living as of November 2016, age 3. The patient had 6 brothers, 4 sisters. One sister was diagnosed with kidney cancer at age 24. ([place]). She is doing well. The patient's maternal grandmother was diagnosed with colon cancer at the age of 19. The patient's mother was diagnosed with a "rare stomach cancer" 20 years ago. The patient's father was diagnosed with prostate cancer at age 76. There is no history of breast or ovarian cancer in the family to her knowledge.   GYNECOLOGIC HISTORY:  No LMP recorded. Patient has had a hysterectomy.  menarche age 69, first live birth age 17, the patient is GX P3. She underwent remote hysterectomy with bilateral salpingo-oophorectomy. She did not use hormone replacement. She never used oral contraceptives.   SOCIAL HISTORY:  [name] used to work as a "[employer] but she is  now disabled because of her multiple arthritic and degenerative problems. Her husband [name] works as a Freight forwarder. Son [name] works in long care in [place]. Son [name] works for a Arboriculturist in [place]. Daughter  [name] is a Haematologist in [place]. The patient has 5 grandchildren. She attends a Physicist, medical church    ADVANCED DIRECTIVES: Not in place   HEALTH MAINTENANCE: Social History  Substance Use Topics  . Smoking status: Current Every Day Smoker -- 0.25 packs/day for 20 years    Types: Cigarettes  . Smokeless tobacco: Former Systems developer    Quit date: 09/30/2015     Comment: quit  . Alcohol Use: No     Colonoscopy:February 2016   IRC:VELFYB post hysterectomy   Bone density:  Lipid panel:  Allergies  Allergen Reactions  . Codeine Itching and Nausea And Vomiting    Itching all over the body  . Latex Hives  . Sulfonamide Derivatives Hives    All over the body    Current Outpatient Prescriptions  Medication Sig  Dispense Refill  . amitriptyline (ELAVIL) 25 MG tablet Take 25 mg by mouth at bedtime.     Marland Kitchen amLODipine (NORVASC) 5 MG tablet Take 1 tablet (5 mg total) by mouth 2 (two) times daily. 180 tablet 3  . furosemide (LASIX) 20 MG tablet TAKE 1 TABLET BY MOUTH EVERY DAY 30 tablet 6  . gabapentin (NEURONTIN) 300 MG capsule Take 1 capsule (300 mg total) by mouth at bedtime. 90 capsule 4  . lidocaine-prilocaine (EMLA) cream Apply 1 application topically as needed. 30 g 0  . lisinopril (PRINIVIL,ZESTRIL) 10 MG tablet TAKE 1 TABLET(10 MG) BY MOUTH DAILY 90 tablet 0  . metFORMIN (GLUCOPHAGE) 500 MG tablet TAKE 1 TABLET(500 MG) BY MOUTH TWICE DAILY WITH A MEAL 180 tablet 0  . methadone (DOLOPHINE) 10 MG tablet Take 10 mg by mouth every 8 (eight) hours as needed for moderate pain.     . metoprolol (LOPRESSOR) 50 MG tablet Take 100 mg by mouth 2 (two) times daily.    Marland Kitchen omeprazole (PRILOSEC) 40 MG capsule TAKE 1 CAPSULE(40 MG) BY MOUTH DAILY 90 capsule 0  . ondansetron (ZOFRAN) 8 MG tablet Reported on 01/28/2016  1  . oxyCODONE (ROXICODONE) 15 MG immediate release tablet Take 15 mg by mouth every 6 (six) hours as needed for pain. Reported on 12/10/2015    . potassium chloride (K-DUR) 10 MEQ tablet Take 3 tablets (30 mEq total) by mouth 2 (two) times daily. 180 tablet 11  . temazepam (RESTORIL) 30 MG capsule TAKE ONE CAPSULE BY MOUTH AT BEDTIME AS NEEDED FOR SLEEP 90 capsule 1   No current facility-administered medications for this visit.    OBJECTIVE: middle-aged African-American woman Who appears stated age  19 Vitals:   03/10/16 1106  BP: 158/85  Pulse: 77  Temp: 97.5 F (36.4 C)  Resp: 18     Body mass index is 40.57 kg/(m^2).    ECOG FS:1 - Symptomatic but completely ambulatory  Skin: warm, dry  HEENT: sclerae anicteric, conjunctivae pink, oropharynx clear. No thrush or mucositis.  Lymph Nodes: No cervical or supraclavicular lymphadenopathy  Lungs: clear to auscultation bilaterally, no rales,  wheezes, or rhonci  Heart: regular rate and rhythm  Abdomen: round, soft, non tender, positive bowel sounds  Musculoskeletal: No focal spinal tenderness, no peripheral edema  Neuro: non focal, well oriented, positive affect  Breasts: deferred  LAB RESULTS:  CMP     Component Value  Date/Time   NA 142 03/10/2016 1052   NA 140 01/09/2016 1841   K 3.3* 03/10/2016 1052   K 2.7* 01/09/2016 1841   CL 104 01/09/2016 1841   CO2 26 03/10/2016 1052   CO2 24 01/09/2016 1841   GLUCOSE 110 03/10/2016 1052   GLUCOSE 144* 01/09/2016 1841   BUN 10.6 03/10/2016 1052   BUN 15 01/09/2016 1841   CREATININE 0.8 03/10/2016 1052   CREATININE 0.74 01/09/2016 1841   CALCIUM 9.3 03/10/2016 1052   CALCIUM 9.1 01/09/2016 1841   PROT 7.0 03/10/2016 1052   PROT 7.7 06/05/2015 1837   ALBUMIN 3.8 03/10/2016 1052   ALBUMIN 4.8 06/05/2015 1837   AST 19 03/10/2016 1052   AST 24 06/05/2015 1837   ALT 16 03/10/2016 1052   ALT 14 06/05/2015 1837   ALKPHOS 67 03/10/2016 1052   ALKPHOS 69 06/05/2015 1837   BILITOT <0.30 03/10/2016 1052   BILITOT 0.5 06/05/2015 1837   GFRNONAA >60 01/09/2016 1841   GFRAA >60 01/09/2016 1841    INo results found for: SPEP, UPEP  Lab Results  Component Value Date   WBC 6.2 03/10/2016   NEUTROABS 3.8 03/10/2016   HGB 11.6 03/10/2016   HCT 34.5* 03/10/2016   MCV 95.8 03/10/2016   PLT 329 03/10/2016      Chemistry      Component Value Date/Time   NA 142 03/10/2016 1052   NA 140 01/09/2016 1841   K 3.3* 03/10/2016 1052   K 2.7* 01/09/2016 1841   CL 104 01/09/2016 1841   CO2 26 03/10/2016 1052   CO2 24 01/09/2016 1841   BUN 10.6 03/10/2016 1052   BUN 15 01/09/2016 1841   CREATININE 0.8 03/10/2016 1052   CREATININE 0.74 01/09/2016 1841      Component Value Date/Time   CALCIUM 9.3 03/10/2016 1052   CALCIUM 9.1 01/09/2016 1841   ALKPHOS 67 03/10/2016 1052   ALKPHOS 69 06/05/2015 1837   AST 19 03/10/2016 1052   AST 24 06/05/2015 1837   ALT 16 03/10/2016 1052     ALT 14 06/05/2015 1837   BILITOT <0.30 03/10/2016 1052   BILITOT 0.5 06/05/2015 1837       No results found for: LABCA2  No components found for: LABCA125  No results for input(s): INR in the last 168 hours.  Urinalysis    Component Value Date/Time   COLORURINE YELLOW 01/09/2016 1948   APPEARANCEUR CLEAR 01/09/2016 1948   LABSPEC 1.010 02/19/2016 1355   LABSPEC 1.020 01/09/2016 1948   PHURINE 6.0 02/19/2016 1355   PHURINE 6.0 01/09/2016 1948   GLUCOSEU Negative 02/19/2016 1355   GLUCOSEU NEGATIVE 01/09/2016 1948   HGBUR Trace 02/19/2016 1355   HGBUR TRACE* 01/09/2016 1948   HGBUR large 07/01/2010 0945   BILIRUBINUR Negative 02/19/2016 1355   BILIRUBINUR NEGATIVE 01/09/2016 1948   BILIRUBINUR n 02/26/2015 1121   KETONESUR Negative 02/19/2016 1355   [place] 01/09/2016 1948   PROTEINUR Negative 02/19/2016 1355   PROTEINUR NEGATIVE 01/09/2016 1948   PROTEINUR n 02/26/2015 1121   UROBILINOGEN 0.2 02/19/2016 1355   UROBILINOGEN 0.2 06/05/2015 2353   UROBILINOGEN 0.2 02/26/2015 1121   NITRITE Negative 02/19/2016 1355   NITRITE NEGATIVE 01/09/2016 1948   NITRITE n 02/26/2015 1121   LEUKOCYTESUR Small 02/19/2016 1355   LEUKOCYTESUR MODERATE* 01/09/2016 1948    STUDIES: No results found.  ASSESSMENT: 55 y.o. [place] woman status post right breast biopsy 3 and axillary lymph node biopsy 08/02/2015 for a clinical mpT1c N0, s  invasive ductal carcinoma, grade 1 or 2, estrogen receptor 95% positive, progesterone receptor 5% positive, with an MIB-1 between 5 and 10%, and HER-2 equivocal on 1 of the 2 biopsies (signals ratio 1.30, number per cell 4.0).tage IA   (1) status post right modified radical mastectomy 10/01/2015 for an mpT2 pN0, stage IIA invasive ductal carcinoma, grade 2, with a total of 20 benign lymph nodes removed  (2) Oncotype DX score of 32 predicts an outside the breast recurrence risk of 22% within 10 years if the patient's only systemic therapy  is tamoxifen for 5 years. It also predicts significant benefit from adjuvant chemotherapy.  (3) patient completed adjuvant doxorubicin and cyclophosphamide in dose dense fashion 4, last dose 12/24/2015, to be followed as tolerated by paclitaxel weekly 12 Starting 01/07/2016   (4) postmastectomy radiation to follow   (5) anti-estrogens to follow completion of local treatment  (a) consider PALLAS  Study  (b) consider BWEL study  PLAN: Melodye has no new complaints to offer today. The labs were reviewed in detail and were stable. She will proceed with cycle 10 of paclitaxel as planned today.  Miyonna will return in 1 week for cycle 11 of treatment. She understands and agrees with this plan. She knows the goal of treatment in her case is cure. She has been encouraged to call with any issues that might arise before her next visit here.    Mikey Bussing, NP   03/10/2016 11:30 AM

## 2016-03-10 NOTE — Progress Notes (Signed)
K+ level today of 3.3. Notified KristinCurcio,NP, gave verbal order for Pt. To take KDur 64meq. 3 tabs,  2 times a day until next visit. Lab will be recheck at next visit. Original script was Kdur 24meq, 3 tabs, bid, pt had been taking 2 tabs once daily. Discussed the importance of the medication and pt verbalized understanding. Also discussed potassium rich foods.

## 2016-03-14 ENCOUNTER — Telehealth: Payer: Self-pay | Admitting: Family Medicine

## 2016-03-14 NOTE — Telephone Encounter (Signed)
[name] - nurse case mgr w/[employer] wanted to let the provider know that the patient has stopped answering their telephone calls. If the patient does call back, they can re-open her case at any time.

## 2016-03-14 NOTE — Telephone Encounter (Signed)
noted 

## 2016-03-17 ENCOUNTER — Ambulatory Visit: Payer: 59

## 2016-03-17 ENCOUNTER — Other Ambulatory Visit (HOSPITAL_BASED_OUTPATIENT_CLINIC_OR_DEPARTMENT_OTHER): Payer: 59

## 2016-03-17 ENCOUNTER — Telehealth: Payer: Self-pay | Admitting: Oncology

## 2016-03-17 ENCOUNTER — Ambulatory Visit (HOSPITAL_BASED_OUTPATIENT_CLINIC_OR_DEPARTMENT_OTHER): Payer: 59 | Admitting: Oncology

## 2016-03-17 VITALS — BP 146/79 | HR 73 | Temp 97.9°F | Resp 18 | Ht 67.0 in | Wt 258.8 lb

## 2016-03-17 DIAGNOSIS — G629 Polyneuropathy, unspecified: Secondary | ICD-10-CM | POA: Diagnosis not present

## 2016-03-17 DIAGNOSIS — Z170 Estrogen receptor positive status [ER+]: Secondary | ICD-10-CM

## 2016-03-17 DIAGNOSIS — C50311 Malignant neoplasm of lower-inner quadrant of right female breast: Secondary | ICD-10-CM

## 2016-03-17 DIAGNOSIS — C773 Secondary and unspecified malignant neoplasm of axilla and upper limb lymph nodes: Secondary | ICD-10-CM | POA: Diagnosis not present

## 2016-03-17 LAB — COMPREHENSIVE METABOLIC PANEL
ALT: 15 U/L (ref 0–55)
AST: 18 U/L (ref 5–34)
Albumin: 3.9 g/dL (ref 3.5–5.0)
Alkaline Phosphatase: 59 U/L (ref 40–150)
Anion Gap: 11 mEq/L (ref 3–11)
BUN: 12 mg/dL (ref 7.0–26.0)
CO2: 25 mEq/L (ref 22–29)
Calcium: 9.2 mg/dL (ref 8.4–10.4)
Chloride: 106 mEq/L (ref 98–109)
Creatinine: 0.8 mg/dL (ref 0.6–1.1)
EGFR: >90 mL/min/{1.73_m2} (ref >90)
Glucose: 100 mg/dl (ref 70–140)
Potassium: 3.5 mEq/L (ref 3.5–5.1)
Sodium: 142 mEq/L (ref 136–145)
Total Bilirubin: <0.3 mg/dL (ref 0.20–1.20)
Total Protein: 6.9 g/dL (ref 6.4–8.3)

## 2016-03-17 LAB — CBC WITH DIFFERENTIAL/PLATELET
BASO%: 0.4 % (ref 0.0–2.0)
Basophils Absolute: 0 10*3/uL (ref 0.0–0.1)
EOS%: 2.5 % (ref 0.0–7.0)
Eosinophils Absolute: 0.1 10*3/uL (ref 0.0–0.5)
HCT: 34.4 % — ABNORMAL LOW (ref 34.8–46.6)
HGB: 11.4 g/dL — ABNORMAL LOW (ref 11.6–15.9)
LYMPH%: 35.8 % (ref 14.0–49.7)
MCH: 31.9 pg (ref 25.1–34.0)
MCHC: 33.1 g/dL (ref 31.5–36.0)
MCV: 96.4 fL (ref 79.5–101.0)
MONO#: 0.3 10*3/uL (ref 0.1–0.9)
MONO%: 5.5 % (ref 0.0–14.0)
NEUT#: 3.2 10*3/uL (ref 1.5–6.5)
NEUT%: 55.8 % (ref 38.4–76.8)
Platelets: 294 10*3/uL (ref 145–400)
RBC: 3.57 10*6/uL — ABNORMAL LOW (ref 3.70–5.45)
RDW: 14.4 % (ref 11.2–14.5)
WBC: 5.6 10*3/uL (ref 3.9–10.3)
lymph#: 2 10*3/uL (ref 0.9–3.3)
nRBC: 0 % (ref 0–0)

## 2016-03-17 NOTE — Progress Notes (Signed)
[place]  [phone]) [phone] Fax:(336) [phone]   ID: [name] DOB: Jul 02, 1961  MR#: [id number]  MBE#:[id number]  Patient Care Team: Diane Morale, MD as PCP - [place] Buechel III, MD as Consulting Physician (General Surgery) Diane Cruel, MD as Consulting Physician (Oncology) Diane Pita, MD as Consulting Physician (Radiation Oncology) Diane Bloom, MD (Anesthesiology) PCP: Diane Morale, MD GYN: OTHER MD: Diane Mainland MD, Diane Bloom MD  CHIEF COMPLAINT: Multicentric estrogen receptor positive breast cancer  CURRENT TREATMENT: awaiting adjuvant radiation  BREAST CANCER HISTORY: From the original intake note:  [name] herself noted a change in her right breast in October and brought it to the attention of her primary care physician. Her last mammogram had been April 2010. Dr. Sarajane Jews set [name] up for bilateral diagnostic mammography with tomosynthesis and [facility]11[facility]. This found the breast density to be category B. In the right breast there were 3 microlobulated masses in the lower inner quadrant measuring 1.9, 1.5, and 1.5 cm. These were multicentric. There were also diffuse fine pleomorphic calcifications surrounding these masses, that area spanning 11.6 cm. The masses were palpable by exam at 4:00 8 cm from the nipple and at 5:00 3 cm from the nipple. By ultrasonography there was an irregular hypoechoic mass in the right breast at 4:00 measuring 1.7 cm, 1 medial to this measuring 1.1 cm, and then the third irregular hypoechoic mass at the 5:00 position measuring 1.3 cm. The right axilla showed multiple lymph nodes with thickened cortices. The largest lymph node measured 0.9 cm.  On 08/02/2015, [name] underwent biopsy of all 3 breast masses as well as a right axillary lymph node. 2 of the tumors were similar invasive ductal carcinomas, grade 2, estrogen receptor 95% positive,  progesterone receptor 5% positive, with an MIB-1 of 10%, and HER-2 equivocal, with a signals ratio of 1.30 and the number per cell 4.0. The third tumor appeared's grade 1 or 2 was also estrogen receptor positive at 95%, progesterone receptor positive at 5%, with an MIP-1 of 5%, and a similar HER-2 profile the signals ratio being 1.46 but the number per cell being only 3.95. By immunohistochemistry on this tumor HER-2 was negative at 1+. Immunohistochemistry on the equivocal reading is pending.  [name]'s subsequent history is as detailed below  INTERVAL HISTORY: [name] returns Diane for follow-up of her stage II breast cancer, accompanied by her husband [name] and her sister in law [name] is scheduled for cycle 11 of 12 weekly planned doses of paclitaxel. -- However [name] tells me since the last week she has developed some numbness in her left foot and left hand. It is of course unusual to develop focal neuropathy from systemic therapy, but nevertheless at this point I think it would be prudent to stop her chemotherapy. She is in agreement with this plan.  REVIEW OF SYSTEMS: [name]'s chronic pain problems are well managed with her current medications, under the care of [name]. She is not constipated from her narcotics. She sleeps poorly, partly because of hot flashes, and she is on gabapentin to help with that. She describes herself is moderately fatigued. She has chronic pain in her knees and back chiefly and has difficulty walking as a result. Her diabetes is moderately well controlled. Aside from these issues a detailed review of systems Diane was stable  PAST MEDICAL HISTORY: Past Medical History  Diagnosis Date  . Osteoarthritis   . Headache(784.0)  migraine like per patient  . Hypertension   . Low back pain     dr [name], pain management  . Degenerative joint disease   . Gallstones   . GERD (gastroesophageal reflux disease)   . DDD (degenerative disc disease), cervical     . Colon polyps   . Diverticulosis   . Diabetes mellitus without complication ([place])   . Breast cancer of lower-inner quadrant of right female breast ([place]) 08/08/2015  . Breast cancer ([place])     PAST SURGICAL HISTORY: Past Surgical History  Procedure Laterality Date  . Replacement total knee Right     right - dr [name]  . Ganglion cyst excision Right     right wrist  . Lipoma excision Right 02/21/08    right hip, [facility]  . Replacement total knee Left 06/25/09    left - dr [name] [facility]  . Tonsillectomy    . Joint replacement      both knees have been done, four times each per patient. Her body rejects the implant, no infection.  . Abdominal hysterectomy  06/22/06    lap, dr [name]  . Other surgical history      lipoma removed from back 2012   . Cholecystectomy  10/28/2011    Procedure: LAPAROSCOPIC CHOLECYSTECTOMY WITH INTRAOPERATIVE CHOLANGIOGRAM;  Surgeon: Diane Regal, MD;  Location: [facility];  Service: General;  Laterality: N/A;  . Mass excision  08/24/2012    Procedure: EXCISION MASS;  Surgeon: Diane Regal, MD;  Location: [facility];  Service: General;  Laterality: Left;  excisie soft tissue masses left shoulder(back) & left upper arm  . Colonoscopy  02-03-13    per Dr. Henrene [name], diverticuli but no polyps, repeat in 5 yrs   . Mass excision Left 01/24/2014    Procedure: EXCISION SOFT TISSUE MASSES LOWER LEFT BACK;  Surgeon: Diane Regal, MD;  Location: [facility];  Service: General;  Laterality: Left;  . Esophagogastroduodenoscopy  02-03-13    per Dr. Henrene [name], normal   . Cardiac catheterization      back in 200 by Dr. Stanford [name]  . Tonsillectomy    . Mastectomy w/ sentinel node biopsy Right 10/02/2015  . Mastectomy w/ sentinel node biopsy Right 10/01/2015    Procedure: RIGHT MASTECTOMY WITH SENTINEL LYMPH NODE BIOPSY;  Surgeon: Diane Messing III, MD;  Location: [facility];  Service: General;  Laterality: Right;  . Portacath placement Left 11/01/2015    Procedure:  INSERTION PORT-A-CATH;  Surgeon: Diane Messing III, MD;  Location: [facility];  Service: General;  Laterality: Left;    FAMILY HISTORY Family History  Problem Relation Age of Onset  . Crohn's disease Other     nephew  . Stomach cancer Mother   . Heart disease Father   . Prostate cancer Father   . Kidney cancer Sister     one out of the four  . Hypertension Sister   . Hypertension Brother   . Colon cancer Maternal Grandmother   The patient's father died at the age of 18 with congestive heart failure. Her mother is still living as of November 2016, age 56. The patient had 6 brothers, 4 sisters. One sister was diagnosed with kidney cancer at age 61. ([place]). She is doing well. The patient's maternal grandmother was diagnosed with colon cancer at the age of 37. The patient's mother was diagnosed with a "rare stomach cancer" 20 years ago. The patient's father was diagnosed with prostate cancer at  age 24. There is no history of breast or ovarian cancer in the family to her knowledge.   GYNECOLOGIC HISTORY:  No LMP recorded. Patient has had a hysterectomy.  menarche age 48, first live birth age 27, the patient is GX P3. She underwent remote hysterectomy with bilateral salpingo-oophorectomy. She did not use hormone replacement. She never used oral contraceptives.   SOCIAL HISTORY:  Pallavi used to work as a "[employer] but she is now disabled because of her multiple arthritic and degenerative problems. Her husband [name] A. Feild works as a Freight forwarder. Son Dalphine Handing works in long care in [place]. Son Evette Doffing works for a Arboriculturist in [place]. Daughter  Silva Bandy is a Haematologist in [place]. The patient has 5 grandchildren. She attends a Physicist, medical church    ADVANCED DIRECTIVES: Not in place   HEALTH MAINTENANCE: Social History  Substance Use Topics  . Smoking status: Current Every Day Smoker -- 0.25 packs/day for 20  years    Types: Cigarettes  . Smokeless tobacco: Former Systems developer    Quit date: 09/30/2015     Comment: quit  . Alcohol Use: No     Colonoscopy:February 2016   ZDG:UYQIHK post hysterectomy   Bone density:  Lipid panel:  Allergies  Allergen Reactions  . Codeine Itching and Nausea And Vomiting    Itching all over the body  . Latex Hives  . Sulfonamide Derivatives Hives    All over the body    Current Outpatient Prescriptions  Medication Sig Dispense Refill  . amitriptyline (ELAVIL) 25 MG tablet Take 25 mg by mouth at bedtime.     Marland Kitchen amLODipine (NORVASC) 5 MG tablet Take 1 tablet (5 mg total) by mouth 2 (two) times daily. 180 tablet 3  . furosemide (LASIX) 20 MG tablet TAKE 1 TABLET BY MOUTH EVERY DAY 30 tablet 6  . gabapentin (NEURONTIN) 300 MG capsule Take 1 capsule (300 mg total) by mouth at bedtime. 90 capsule 4  . lidocaine-prilocaine (EMLA) cream Apply 1 application topically as needed. 30 g 0  . lisinopril (PRINIVIL,ZESTRIL) 10 MG tablet TAKE 1 TABLET(10 MG) BY MOUTH DAILY 90 tablet 0  . metFORMIN (GLUCOPHAGE) 500 MG tablet TAKE 1 TABLET(500 MG) BY MOUTH TWICE DAILY WITH A MEAL 180 tablet 0  . methadone (DOLOPHINE) 10 MG tablet Take 10 mg by mouth every 8 (eight) hours as needed for moderate pain.     . metoprolol (LOPRESSOR) 50 MG tablet Take 100 mg by mouth 2 (two) times daily.    Marland Kitchen omeprazole (PRILOSEC) 40 MG capsule TAKE 1 CAPSULE(40 MG) BY MOUTH DAILY 90 capsule 0  . ondansetron (ZOFRAN) 8 MG tablet Reported on 01/28/2016  1  . oxyCODONE (ROXICODONE) 15 MG immediate release tablet Take 15 mg by mouth every 6 (six) hours as needed for pain. Reported on 12/10/2015    . potassium chloride (K-DUR) 10 MEQ tablet Take 3 tablets (30 mEq total) by mouth 2 (two) times daily. 180 tablet 11  . temazepam (RESTORIL) 30 MG capsule TAKE ONE CAPSULE BY MOUTH AT BEDTIME AS NEEDED FOR SLEEP 90 capsule 1   No current facility-administered medications for this visit.    OBJECTIVE: middle-aged  African-American woman In no acute distress  Filed Vitals:   03/17/16 1103  BP: 146/79  Pulse: 73  Temp: 97.9 F (36.6 C)  Resp: 18     Body mass index is 40.52 kg/(m^2).    ECOG FS:1 - Symptomatic but completely ambulatory  Sclerae  unicteric, pupils round and equal Oropharynx clear and moist-- no thrush or other lesions No cervical or supraclavicular adenopathy Lungs no rales or rhonchi Heart regular rate and rhythm Abd soft, obese, nontender, positive bowel sounds MSK no focal spinal tenderness, no upper extremity lymphedema Neuro: Subjective sensation of numbness in the left foot anteriorly and dorsally, as well as the left hand, but with good grip bilaterally and good dorsiflexion as well. She is well oriented, positive affect Breasts: Deferred    LAB RESULTS:  CMP     Component Value Date/Time   NA 142 03/17/2016 1020   NA 140 01/09/2016 1841   K 3.5 03/17/2016 1020   K 2.7* 01/09/2016 1841   CL 104 01/09/2016 1841   CO2 25 03/17/2016 1020   CO2 24 01/09/2016 1841   GLUCOSE 100 03/17/2016 1020   GLUCOSE 144* 01/09/2016 1841   BUN 12.0 03/17/2016 1020   BUN 15 01/09/2016 1841   CREATININE 0.8 03/17/2016 1020   CREATININE 0.74 01/09/2016 1841   CALCIUM 9.2 03/17/2016 1020   CALCIUM 9.1 01/09/2016 1841   PROT 6.9 03/17/2016 1020   PROT 7.7 06/05/2015 1837   ALBUMIN 3.9 03/17/2016 1020   ALBUMIN 4.8 06/05/2015 1837   AST 18 03/17/2016 1020   AST 24 06/05/2015 1837   ALT 15 03/17/2016 1020   ALT 14 06/05/2015 1837   ALKPHOS 59 03/17/2016 1020   ALKPHOS 69 06/05/2015 1837   BILITOT <0.30 03/17/2016 1020   BILITOT 0.5 06/05/2015 1837   GFRNONAA >60 01/09/2016 1841   GFRAA >60 01/09/2016 1841    INo results found for: SPEP, UPEP  Lab Results  Component Value Date   WBC 5.6 03/17/2016   NEUTROABS 3.2 03/17/2016   HGB 11.4* 03/17/2016   HCT 34.4* 03/17/2016   MCV 96.4 03/17/2016   PLT 294 03/17/2016      Chemistry      Component Value Date/Time   NA  142 03/17/2016 1020   NA 140 01/09/2016 1841   K 3.5 03/17/2016 1020   K 2.7* 01/09/2016 1841   CL 104 01/09/2016 1841   CO2 25 03/17/2016 1020   CO2 24 01/09/2016 1841   BUN 12.0 03/17/2016 1020   BUN 15 01/09/2016 1841   CREATININE 0.8 03/17/2016 1020   CREATININE 0.74 01/09/2016 1841      Component Value Date/Time   CALCIUM 9.2 03/17/2016 1020   CALCIUM 9.1 01/09/2016 1841   ALKPHOS 59 03/17/2016 1020   ALKPHOS 69 06/05/2015 1837   AST 18 03/17/2016 1020   AST 24 06/05/2015 1837   ALT 15 03/17/2016 1020   ALT 14 06/05/2015 1837   BILITOT <0.30 03/17/2016 1020   BILITOT 0.5 06/05/2015 1837       No results found for: LABCA2  No components found for: LABCA125  No results for input(s): INR in the last 168 hours.  Urinalysis    Component Value Date/Time   COLORURINE YELLOW 01/09/2016 1948   APPEARANCEUR CLEAR 01/09/2016 1948   LABSPEC 1.010 02/19/2016 1355   LABSPEC 1.020 01/09/2016 1948   PHURINE 6.0 02/19/2016 1355   PHURINE 6.0 01/09/2016 1948   GLUCOSEU Negative 02/19/2016 [place] 01/09/2016 1948   HGBUR Trace 02/19/2016 [place] 01/09/2016 1948   HGBUR large 07/01/2010 0945   BILIRUBINUR Negative 02/19/2016 [place] 01/09/2016 1948   BILIRUBINUR n 02/26/2015 1121   KETONESUR Negative 02/19/2016 1355   [place] 01/09/2016 1948   PROTEINUR Negative  02/19/2016 [place] 01/09/2016 1948   PROTEINUR n 02/26/2015 1121   UROBILINOGEN 0.2 02/19/2016 1355   UROBILINOGEN 0.2 06/05/2015 2353   UROBILINOGEN 0.2 02/26/2015 1121   NITRITE Negative 02/19/2016 1355   NITRITE NEGATIVE 01/09/2016 1948   NITRITE n 02/26/2015 1121   LEUKOCYTESUR Small 02/19/2016 1355   LEUKOCYTESUR MODERATE* 01/09/2016 1948    STUDIES: No results found.  ASSESSMENT: 55 y.o.  woman status post right breast lower inner quadrant biopsy 3 and axillary lymph node biopsy 08/02/2015 for a clinical mpT1c  N0, stage IA invasive ductal carcinoma, grade 1 or 2, estrogen receptor 95% positive, progesterone receptor 5% positive, with an MIB-1 between 5 and 10%, and HER-2 equivocal on 1 of the 2 biopsies (signals ratio 1.30, number per cell 4.0).  (1) status post right modified radical mastectomy 10/01/2015 for an mpT2 pN0, stage IIA invasive ductal carcinoma, grade 2, with a total of 20 benign lymph nodes removed  (2) Oncotype DX score of 32 predicts an outside the breast recurrence risk of 22% within 10 years if the patient's only systemic therapy is tamoxifen for 5 years. It also predicts significant benefit from adjuvant chemotherapy.  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 This cannot be explained simply as a result of her paclitaxel since that generally produces a symmetric polyneuropathy. However she may have a baseline issue on the left side and the paclitaxel certainly could be making that manifest.  In any case we are going to stop her paclitaxel at this point. She has completed the vast majority of her chemotherapy and is to be commended for the effort she has made to get herself through these difficult treatments.  She is going to meet with Dr. Tammi Klippel to discuss adjuvant radiation. I have made her a return appointment with me for September. At that time she should be ready to have her port removed and to start anti-estrogens, which most likely will be anastrozole. We will also screen her for the PALLAS study  She knows to call for any problems  that may develop before her next visit here.   Diane Cruel, MD   03/17/2016 11:29 AM

## 2016-03-17 NOTE — Telephone Encounter (Signed)
appt made/cxl per GM 6/26 pof avs printed

## 2016-03-19 ENCOUNTER — Ambulatory Visit: Payer: 59 | Admitting: Radiation Oncology

## 2016-03-19 ENCOUNTER — Ambulatory Visit: Admission: RE | Admit: 2016-03-19 | Payer: 59 | Source: Ambulatory Visit | Admitting: Radiation Oncology

## 2016-03-19 ENCOUNTER — Ambulatory Visit: Payer: 59

## 2016-03-22 ENCOUNTER — Other Ambulatory Visit: Payer: Self-pay | Admitting: Family Medicine

## 2016-03-24 ENCOUNTER — Other Ambulatory Visit: Payer: 59

## 2016-03-24 ENCOUNTER — Ambulatory Visit: Payer: 59

## 2016-03-24 ENCOUNTER — Ambulatory Visit: Payer: 59 | Admitting: Oncology

## 2016-03-26 ENCOUNTER — Other Ambulatory Visit: Payer: Self-pay

## 2016-03-26 NOTE — Telephone Encounter (Signed)
Call in #[phone] with one rf 

## 2016-03-26 NOTE — Telephone Encounter (Signed)
Error, rx has already been filled.

## 2016-03-27 ENCOUNTER — Telehealth: Payer: Self-pay | Admitting: *Deleted

## 2016-03-27 NOTE — Telephone Encounter (Signed)
  Oncology Nurse Navigator Documentation    Navigator Encounter Type: Telephone (left vm to return call to assess needs after final chemo) (03/27/16 1500) Telephone: Lahoma Crocker Call (Contact information provided) (03/27/16 1500)         Patient Visit Type: MedOnc (03/27/16 1500) Treatment Phase: Final Chemo TX (03/27/16 1500)                            Time Spent with Patient: 15 (03/27/16 1500)

## 2016-03-31 ENCOUNTER — Ambulatory Visit
Admission: RE | Admit: 2016-03-31 | Discharge: 2016-03-31 | Disposition: A | Discharge status: Home / Self Care | Payer: 59 | Source: Ambulatory Visit | Admitting: Radiation Oncology

## 2016-03-31 ENCOUNTER — Ambulatory Visit
Admission: RE | Admit: 2016-03-31 | Discharge: 2016-03-31 | Disposition: A | Discharge status: Home / Self Care | Payer: 59 | Source: Ambulatory Visit | Attending: Radiation Oncology | Admitting: Radiation Oncology

## 2016-03-31 ENCOUNTER — Encounter: Payer: Self-pay | Admitting: Radiation Oncology

## 2016-03-31 VITALS — BP 142/70 | HR 68 | Resp 16 | Ht 67.0 in | Wt 259.0 lb

## 2016-03-31 DIAGNOSIS — Z170 Estrogen receptor positive status [ER+]: Secondary | ICD-10-CM | POA: Diagnosis not present

## 2016-03-31 DIAGNOSIS — C50319 Malignant neoplasm of lower-inner quadrant of unspecified female breast: Secondary | ICD-10-CM | POA: Diagnosis present

## 2016-03-31 DIAGNOSIS — Z510 Encounter for antineoplastic radiation therapy: Secondary | ICD-10-CM | POA: Diagnosis not present

## 2016-03-31 DIAGNOSIS — Z9011 Acquired absence of right breast and nipple: Secondary | ICD-10-CM | POA: Diagnosis not present

## 2016-03-31 DIAGNOSIS — C50311 Malignant neoplasm of lower-inner quadrant of right female breast: Secondary | ICD-10-CM

## 2016-03-31 NOTE — Progress Notes (Signed)
Location of Breast Cancer:Multicentric estrogen receptor positive breast cancer. Ready for adjuvant radiation.  post right breast lower inner quadrant biopsy 3 and axillary lymph node biopsy 08/02/2015 for a clinical mpT1c N0, stage IA invasive ductal carcinoma, grade 1 or 2, estrogen receptor 95% positive, progesterone receptor 5% positive, with an MIB-1 between 5 and 10%, and HER-2 equivocal on 1 of the 2 biopsies (signals ratio 1.30, number per cell 4.0).  Did patient present with symptoms (if so, please note symptoms) or was this found on screening mammography?: [name] herself noted a change in her right breast in October and brought it to the attention of her PCP.  Past/Anticipated interventions by surgeon, if any: status post right modified radical mastectomy 10/01/2015 for an mpT2 pN0, stage IIA invasive ductal carcinoma, grade 2, with a total of 20 benign lymph nodes removeddone by Dr. Marlou Starks on 10/01/15  Past/Anticipated interventions by medical oncology, if any: patient completed adjuvant doxorubicin and cyclophosphamide in dose dense fashion 4, last dose 12/24/2015, followed by paclitaxel weekly starting 01/07/2016  (a) completed 10 of 12 planned paclitaxel doses, final 2 doses omitted because of neuropathy concerns   Lymphedema issues, if any:  Reports she has not been diagnosed with lymphedema but, she has noted edema in her right arm in the morning and evening hours  Pain issues, if any: Reports persistent right chest wall pain much worse when she wears her prosthesis.   SAFETY ISSUES:  Prior radiation? no  Pacemaker/ICD? no  Possible current pregnancy?no  Is the patient on methotrexate? no  Current Complaints / other details:  55 year old female. Reports neuropathy in her left hand and foot continue. Reports since stopping chemotherapy she has noted increased joint pain in her legs. Reports intense intermittent hot flashes day and night.    Joaquim Lai,  RN 03/31/2016,11:34 AM

## 2016-03-31 NOTE — Progress Notes (Signed)
See progress note under physician encounter. 

## 2016-03-31 NOTE — Progress Notes (Signed)
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   HISTORY OF PRESENT ILLNESS::Diane TRULA FREDE is a 55 y.o. female who presented with a palpable right breast mass which she discovered in the shower. Her last mammogram was in 2010. She was last seen by Dr. Pablo Ledger in [facility] in November 2016. Work up showed three masses in the lower inner quadrant with associated calcifications which together measured 11.6 cm. The largest mass measured 1.9 cm. Ultrasound confirmed these findings as well as multiple lymph nodes in the axilla with thickened cortices. The largest lymph nodes measured 0.9 cm. Biopsy of two masses revealed grade 1 invasive ductal carcinoma which was ER/PR positive and HER2 negative. The third mass showed grade 2 invasive ductal carcinoma ER/PR positive and HER2 equivocal. KI67 10%. [facility] [place] testing is pending on this sample. Lymph node was biopsied and was benign.  She presents today after a right modified radical mastectomy 10/01/2015. Pathology revealed her tumor was an mp T2 pN0, Stage IIA invasive ductal carcinoma, grade 2, with a total of 20 benign lymph nodes removed by Dr. Marlou Starks. She received chemotherapy, including doxorubicin and cyclophosphamide, in dose dense fashion x 4, last dose 12/24/2015, followed by paclitaxel weekly starting 01/07/2016. She completed 10 of 12 planned paclitaxel doses, final 2 doses omitted because of neuropathy concerns. She is accompanied by her 2 sisters. She is here today to discuss radiation treatment options.  PREVIOUS  RADIATION THERAPY: No  PAST MEDICAL HISTORY:  has a past medical history of Osteoarthritis; Headache(784.0); Hypertension; Low back pain; Degenerative joint disease; Gallstones; GERD (gastroesophageal reflux disease); DDD (degenerative disc disease), cervical; Colon polyps; Diverticulosis; Diabetes mellitus without complication ([place]); Breast cancer of lower-inner quadrant of right female breast ([name]) (08/08/2015); and Breast cancer ([place]).    PAST SURGICAL HISTORY: Past Surgical History  Procedure Laterality Date  . Replacement total knee Right     right - dr Novella Olive  . Ganglion cyst excision Right     right wrist  . Lipoma excision Right 02/21/08    right hip, [facility]  . Replacement total knee Left 06/25/09    left - dr Marciano Sequin [facility]  . Tonsillectomy    . Joint replacement      both knees have been done, four times each per patient. Her body rejects the implant, no infection.  . Abdominal hysterectomy  06/22/06    lap, dr Mancel Bale  . Other surgical history      lipoma removed from back 2012   . Cholecystectomy  10/28/2011    Procedure: LAPAROSCOPIC CHOLECYSTECTOMY WITH INTRAOPERATIVE CHOLANGIOGRAM;  Surgeon: Earnstine Regal, MD;  Location: [facility];  Service: General;  Laterality: N/A;  . Mass excision  08/24/2012    Procedure: EXCISION MASS;  Surgeon: Earnstine Regal, MD;  Location: [facility];  Service: General;  Laterality: Left;  excisie soft tissue masses left shoulder(back) & left upper arm  . Colonoscopy  02-03-13    per Dr. Henrene Pastor, diverticuli but no polyps, repeat in 5 yrs   . Mass excision Left 01/24/2014  Procedure: EXCISION SOFT TISSUE MASSES LOWER LEFT BACK;  Surgeon: Earnstine Regal, MD;  Location: [facility];  Service: General;  Laterality: Left;  . Esophagogastroduodenoscopy  02-03-13    per Dr. Henrene Pastor, normal   . Cardiac catheterization      back in 200 by Dr. Stanford Breed  . Tonsillectomy    . Mastectomy w/ sentinel node biopsy Right 10/02/2015  .  Mastectomy w/ sentinel node biopsy Right 10/01/2015    Procedure: RIGHT MASTECTOMY WITH SENTINEL LYMPH NODE BIOPSY;  Surgeon: Autumn Messing III, MD;  Location: [facility];  Service: General;  Laterality: Right;  . Portacath placement Left 11/01/2015    Procedure: INSERTION PORT-A-CATH;  Surgeon: Autumn Messing III, MD;  Location: [facility];  Service: General;  Laterality: Left;    FAMILY HISTORY: family history includes Colon cancer in her maternal grandmother; Crohn's disease in her other; Heart disease in her father; Hypertension in her brother and sister; Kidney cancer in her sister; Prostate cancer in her father; Stomach cancer in her mother.  SOCIAL HISTORY:  reports that she has been smoking Cigarettes.  She has a 5 pack-year smoking history. She quit smokeless tobacco use about 6 months ago. She reports that she does not drink alcohol or use illicit drugs.  ALLERGIES: Codeine; Latex; and Sulfonamide derivatives  MEDICATIONS:  Current Outpatient Prescriptions  Medication Sig Dispense Refill  . amitriptyline (ELAVIL) 25 MG tablet Take 25 mg by mouth at bedtime.     Marland Kitchen amLODipine (NORVASC) 5 MG tablet Take 1 tablet (5 mg total) by mouth 2 (two) times daily. 180 tablet 3  . furosemide (LASIX) 20 MG tablet TAKE 1 TABLET BY MOUTH EVERY DAY 30 tablet 6  . gabapentin (NEURONTIN) 300 MG capsule Take 1 capsule (300 mg total) by mouth at bedtime. 90 capsule 4  . lidocaine-prilocaine (EMLA) cream Apply 1 application topically as needed. 30 g 0  . lisinopril (PRINIVIL,ZESTRIL) 10 MG tablet TAKE 1 TABLET(10 MG) BY MOUTH DAILY 90 tablet 0  . metFORMIN (GLUCOPHAGE) 500 MG tablet TAKE 1 TABLET(500 MG) BY MOUTH TWICE DAILY WITH A MEAL 180 tablet 0  . methadone (DOLOPHINE) 10 MG tablet Take 10 mg by mouth every 8 (eight) hours as needed for moderate pain.     . metoprolol (LOPRESSOR) 50 MG tablet Take 100 mg by mouth 2 (two) times daily.    Marland Kitchen omeprazole (PRILOSEC) 40 MG capsule TAKE 1 CAPSULE(40 MG) BY  MOUTH DAILY 90 capsule 0  . oxyCODONE (ROXICODONE) 15 MG immediate release tablet Take 15 mg by mouth every 6 (six) hours as needed for pain. Reported on 12/10/2015    . potassium chloride (K-DUR) 10 MEQ tablet Take 3 tablets (30 mEq total) by mouth 2 (two) times daily. 180 tablet 11  . temazepam (RESTORIL) 30 MG capsule TAKE 1 CAPSULE BY MOUTH EVERY DAY AT BEDTIME AS NEEDED 90 capsule 1  . ondansetron (ZOFRAN) 8 MG tablet Reported on 03/31/2016  1   No current facility-administered medications for this encounter.    REVIEW OF SYSTEMS:  On review of systems, the patient reports that she is doing well overall. She denies any shortness of breath, cough, fevers, chills, night sweats, unintended weight changes. She denies any bowel or bladder disturbances, and denies abdominal pain, nausea or vomiting. She denies any new musculoskeletal. She mentions she has not been diagnosed with lymphedema but she has noted edema in her right arm in the morning and evening hours. She reports a persistent right  chest wall pain much worse when she wears her prosthesis. 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 She reports since stopping chemotherapy she has noted increased joint pain in her legs. Reports intense intermittent hot flashes day and night. A complete review of systems is obtained and is otherwise negative.   PHYSICAL EXAM:  height is 5' 7" (1.702 m) and weight is 259 lb (117.482 kg). Her blood pressure is 142/70 and her pulse is 68. Her respiration is 16 and oxygen saturation is 100%.    In general this is a well appearing female in no acute distress. She is alert and oriented x4 and appropriate throughout the examination. No palpable supraclavicular, cervical, or axillary adenopathy. Her left port-o-cath feels normal. Her left mastectomy incision is well healed. No palpable masses in the left breast.  KPS = 90  100 - Normal; no complaints; no evidence of disease. 90   - Able to carry on  normal activity; minor signs or symptoms of disease. 80   - Normal activity with effort; some signs or symptoms of disease. 6   - Cares for self; unable to carry on normal activity or to do active work. 60   - Requires occasional assistance, but is able to care for most of his personal needs. 50   - Requires considerable assistance and frequent medical care. 30   - Disabled; requires special care and assistance. 22   - Severely disabled; hospital admission is indicated although death not imminent. 39   - Very sick; hospital admission necessary; active supportive treatment necessary. 10   - Moribund; fatal processes progressing rapidly. 0     - Dead  Karnofsky DA, Abelmann [place], Craver LS and Burchenal [facility] [phone]) The use of the nitrogen mustards in the palliative treatment of carcinoma: with particular reference to bronchogenic carcinoma Cancer 1 634-56  LABORATORY DATA:  Lab Results  Component Value Date   WBC 5.6 03/17/2016   HGB 11.4* 03/17/2016   HCT 34.4* 03/17/2016   MCV 96.4 03/17/2016   PLT 294 03/17/2016   Lab Results  Component Value Date   NA 142 03/17/2016   K 3.5 03/17/2016   CL 104 01/09/2016   CO2 25 03/17/2016   Lab Results  Component Value Date   ALT 15 03/17/2016   AST 18 03/17/2016   ALKPHOS 59 03/17/2016   BILITOT <0.30 03/17/2016    RADIOGRAPHY: No results found.  IMPRESSION: Ms. Laden is a 54 yo woman with Stage IIA, multifocal T2 N0 invasive ductal carcinoma of the LIQ of the right female breast, s/p right modified radical mastectomy. She may benefit from adjuvant chest wall external radiation.  PLAN: I spoke to the patient today regarding her diagnosis and options for treatment. We discussed survival and local failure. We discussed the role of radiation in decreasing local failures.  We discussed the process of simulation and the placement tattoos. We discussed 6 weeks of treatment as an outpatient. We discussed the possibility of asymptomatic lung  damage. We discussed the low likelihood of secondary malignancies. We discussed the possible side effects including but not limited to skin redness, fatigue, permanent skin darkening, and breast swelling.   She will go back to Dr. Marlou Starks to consider scar revision in conjunction with portacath removal.  I will see her back afterwards. Her planning appointment will be scheduled in the near future.  She has a follow up appointment with Dr. Jana Hakim. She will start antiestrogen therapy.   I spent 40  minutes minutes face to face with the patient and more than 50% of that time was spent in counseling and/or coordination of care.   ------------------------------------------------  Sheral Apley. Tammi Klippel, M.D.    This document serves as a record of services personally performed by Tyler Pita, MD. It was created on his behalf by Lendon Collar, a trained medical scribe. The creation of this record is based on the scribe's personal observations and the provider's statements to them. This document has been checked and approved by the attending provider.

## 2016-04-01 ENCOUNTER — Other Ambulatory Visit: Payer: Self-pay | Admitting: Family Medicine

## 2016-04-01 NOTE — Telephone Encounter (Signed)
Refill sent to pharmacy.   

## 2016-04-04 ENCOUNTER — Ambulatory Visit: Payer: 59 | Admitting: Radiation Oncology

## 2016-04-07 ENCOUNTER — Encounter: Payer: Self-pay | Admitting: Oncology

## 2016-04-07 ENCOUNTER — Encounter: Payer: Self-pay | Admitting: Family Medicine

## 2016-04-07 ENCOUNTER — Ambulatory Visit (INDEPENDENT_AMBULATORY_CARE_PROVIDER_SITE_OTHER): Payer: 59 | Admitting: Family Medicine

## 2016-04-07 VITALS — BP 146/92 | Temp 98.3°F | Ht 67.0 in | Wt 257.0 lb

## 2016-04-07 DIAGNOSIS — I10 Essential (primary) hypertension: Secondary | ICD-10-CM | POA: Diagnosis not present

## 2016-04-07 DIAGNOSIS — E119 Type 2 diabetes mellitus without complications: Secondary | ICD-10-CM

## 2016-04-07 LAB — HEMOGLOBIN A1C: Hgb A1c MFr Bld: 6 % (ref 4.6–6.5)

## 2016-04-07 LAB — LIPID PANEL
Cholesterol: 212 mg/dL — ABNORMAL HIGH (ref 0–200)
HDL: 35.2 mg/dL — ABNORMAL LOW (ref >39.00)
NonHDL: 177.03
Total CHOL/HDL Ratio: 6
Triglycerides: 263 mg/dL — ABNORMAL HIGH (ref 0.0–149.0)
VLDL: 52.6 mg/dL — ABNORMAL HIGH (ref 0.0–40.0)

## 2016-04-07 LAB — TSH: TSH: 0.92 u[IU]/mL (ref 0.35–4.50)

## 2016-04-07 LAB — LDL CHOLESTEROL, DIRECT: Direct LDL: 121 mg/dL

## 2016-04-07 MED ORDER — LISINOPRIL 10 MG PO TABS: ORAL_TABLET | ORAL | Status: DC

## 2016-04-07 MED ORDER — FUROSEMIDE 20 MG PO TABS: 20.0000 mg | ORAL_TABLET | Freq: Every day | ORAL | Status: DC

## 2016-04-07 MED ORDER — AMLODIPINE BESYLATE 5 MG PO TABS: ORAL_TABLET | ORAL | Status: DC

## 2016-04-07 NOTE — Progress Notes (Signed)
   Subjective:    Patient ID: [name], female    DOB: [birth year], 55 y.o.   MRN: [id number]  HPI Here to follow up on DM and HTN. She has been doing very well in general, considering she has been undergoing treatment for breast cancer. She has completed chemotherapy per Dr. Jana Hakim, and she will soon be starting radiation therapy per Dr. Tammi Klippel. Her BP has been stable. Her random glucoses have also been stable. Renal function looks good.    Review of Systems  Constitutional: Negative.   Respiratory: Negative.   Cardiovascular: Negative.   Neurological: Negative.        Objective:   Physical Exam  Constitutional: She is oriented to person, place, and time. She appears well-developed and well-nourished.  Neck: No thyromegaly present.  Cardiovascular: Normal rate, regular rhythm, normal heart sounds and intact distal pulses.   Pulmonary/Chest: Effort normal and breath sounds normal.  Musculoskeletal:  1+ edema in both feet   Lymphadenopathy:    She has no cervical adenopathy.  Neurological: She is alert and oriented to person, place, and time.          Assessment & Plan:  She is doing well in general. Now that her strength is returning she plans to start walking every day again. Her HTN is stable. Get labs today including an A1c and lipids.  Laurey Morale, MD

## 2016-04-07 NOTE — Progress Notes (Signed)
forms left in my box °

## 2016-04-07 NOTE — Progress Notes (Signed)
Pre visit review using our clinic review tool, if applicable. No additional management support is needed unless otherwise documented below in the visit note. 

## 2016-04-08 ENCOUNTER — Encounter: Payer: Self-pay | Admitting: Oncology

## 2016-04-08 NOTE — Progress Notes (Signed)
forms left in my box- left for dr. Jana Hakim to sign

## 2016-04-08 NOTE — Progress Notes (Signed)
Emailed [facility]radiation-dr. Tammi Klippel. Patient left upstairs

## 2016-04-18 ENCOUNTER — Other Ambulatory Visit: Payer: Self-pay | Admitting: General Surgery

## 2016-04-20 ENCOUNTER — Other Ambulatory Visit: Payer: Self-pay | Admitting: Family Medicine

## 2016-04-25 ENCOUNTER — Encounter (HOSPITAL_BASED_OUTPATIENT_CLINIC_OR_DEPARTMENT_OTHER): Payer: Self-pay | Admitting: *Deleted

## 2016-04-26 ENCOUNTER — Other Ambulatory Visit: Payer: Self-pay | Admitting: Family Medicine

## 2016-04-28 ENCOUNTER — Encounter (HOSPITAL_BASED_OUTPATIENT_CLINIC_OR_DEPARTMENT_OTHER)
Admission: RE | Admit: 2016-04-28 | Discharge: 2016-04-28 | Disposition: A | Discharge status: Home / Self Care | Payer: 59 | Source: Ambulatory Visit | Attending: General Surgery | Admitting: General Surgery

## 2016-04-28 ENCOUNTER — Encounter (HOSPITAL_BASED_OUTPATIENT_CLINIC_OR_DEPARTMENT_OTHER): Payer: Self-pay | Admitting: *Deleted

## 2016-04-28 DIAGNOSIS — Z9011 Acquired absence of right breast and nipple: Secondary | ICD-10-CM | POA: Diagnosis not present

## 2016-04-28 DIAGNOSIS — E669 Obesity, unspecified: Secondary | ICD-10-CM | POA: Diagnosis not present

## 2016-04-28 DIAGNOSIS — Z7984 Long term (current) use of oral hypoglycemic drugs: Secondary | ICD-10-CM | POA: Diagnosis not present

## 2016-04-28 DIAGNOSIS — G629 Polyneuropathy, unspecified: Secondary | ICD-10-CM | POA: Diagnosis not present

## 2016-04-28 DIAGNOSIS — Z9221 Personal history of antineoplastic chemotherapy: Secondary | ICD-10-CM | POA: Diagnosis not present

## 2016-04-28 DIAGNOSIS — M1990 Unspecified osteoarthritis, unspecified site: Secondary | ICD-10-CM | POA: Diagnosis not present

## 2016-04-28 DIAGNOSIS — C50511 Malignant neoplasm of lower-outer quadrant of right female breast: Secondary | ICD-10-CM | POA: Diagnosis not present

## 2016-04-28 DIAGNOSIS — I10 Essential (primary) hypertension: Secondary | ICD-10-CM | POA: Diagnosis not present

## 2016-04-28 DIAGNOSIS — Z6841 Body Mass Index (BMI) 40.0 and over, adult: Secondary | ICD-10-CM | POA: Diagnosis not present

## 2016-04-28 DIAGNOSIS — Z853 Personal history of malignant neoplasm of breast: Secondary | ICD-10-CM | POA: Diagnosis not present

## 2016-04-28 DIAGNOSIS — C50911 Malignant neoplasm of unspecified site of right female breast: Secondary | ICD-10-CM | POA: Diagnosis present

## 2016-04-28 DIAGNOSIS — Z79899 Other long term (current) drug therapy: Secondary | ICD-10-CM | POA: Diagnosis not present

## 2016-04-28 DIAGNOSIS — K219 Gastro-esophageal reflux disease without esophagitis: Secondary | ICD-10-CM | POA: Diagnosis not present

## 2016-04-28 DIAGNOSIS — E119 Type 2 diabetes mellitus without complications: Secondary | ICD-10-CM | POA: Diagnosis not present

## 2016-04-28 DIAGNOSIS — Z170 Estrogen receptor positive status [ER+]: Secondary | ICD-10-CM | POA: Diagnosis not present

## 2016-04-29 LAB — POCT I-STAT, CHEM 8
BUN: 10 mg/dL (ref 6–20)
Calcium, Ion: 1.19 mmol/L (ref 1.13–1.30)
Chloride: 101 mmol/L (ref 101–111)
Creatinine, Ser: 0.8 mg/dL (ref 0.44–1.00)
Glucose, Bld: 111 mg/dL — ABNORMAL HIGH (ref 65–99)
HCT: 37 % (ref 36.0–46.0)
Hemoglobin: 12.6 g/dL (ref 12.0–15.0)
Potassium: 3.1 mmol/L — ABNORMAL LOW (ref 3.5–5.1)
Sodium: 143 mmol/L (ref 135–145)
TCO2: 26 mmol/L (ref 0–100)

## 2016-04-30 ENCOUNTER — Encounter (HOSPITAL_BASED_OUTPATIENT_CLINIC_OR_DEPARTMENT_OTHER)
Admission: RE | Disposition: A | Discharge status: Home / Self Care | Payer: Self-pay | Source: Ambulatory Visit | Attending: General Surgery

## 2016-04-30 ENCOUNTER — Ambulatory Visit (HOSPITAL_BASED_OUTPATIENT_CLINIC_OR_DEPARTMENT_OTHER)
Admission: RE | Admit: 2016-04-30 | Discharge: 2016-04-30 | Disposition: A | Discharge status: Home / Self Care | Payer: 59 | Source: Ambulatory Visit | Attending: General Surgery | Admitting: General Surgery

## 2016-04-30 ENCOUNTER — Ambulatory Visit (HOSPITAL_BASED_OUTPATIENT_CLINIC_OR_DEPARTMENT_OTHER): Payer: 59 | Admitting: Anesthesiology

## 2016-04-30 ENCOUNTER — Encounter (HOSPITAL_BASED_OUTPATIENT_CLINIC_OR_DEPARTMENT_OTHER): Payer: Self-pay | Admitting: *Deleted

## 2016-04-30 DIAGNOSIS — E119 Type 2 diabetes mellitus without complications: Secondary | ICD-10-CM | POA: Insufficient documentation

## 2016-04-30 DIAGNOSIS — C50511 Malignant neoplasm of lower-outer quadrant of right female breast: Secondary | ICD-10-CM | POA: Diagnosis not present

## 2016-04-30 DIAGNOSIS — G629 Polyneuropathy, unspecified: Secondary | ICD-10-CM | POA: Insufficient documentation

## 2016-04-30 DIAGNOSIS — K219 Gastro-esophageal reflux disease without esophagitis: Secondary | ICD-10-CM | POA: Insufficient documentation

## 2016-04-30 DIAGNOSIS — Z7984 Long term (current) use of oral hypoglycemic drugs: Secondary | ICD-10-CM | POA: Insufficient documentation

## 2016-04-30 DIAGNOSIS — Z79899 Other long term (current) drug therapy: Secondary | ICD-10-CM | POA: Insufficient documentation

## 2016-04-30 DIAGNOSIS — Z9221 Personal history of antineoplastic chemotherapy: Secondary | ICD-10-CM | POA: Insufficient documentation

## 2016-04-30 DIAGNOSIS — Z9011 Acquired absence of right breast and nipple: Secondary | ICD-10-CM | POA: Insufficient documentation

## 2016-04-30 DIAGNOSIS — Z6841 Body Mass Index (BMI) 40.0 and over, adult: Secondary | ICD-10-CM | POA: Insufficient documentation

## 2016-04-30 DIAGNOSIS — E669 Obesity, unspecified: Secondary | ICD-10-CM | POA: Insufficient documentation

## 2016-04-30 DIAGNOSIS — Z170 Estrogen receptor positive status [ER+]: Secondary | ICD-10-CM | POA: Insufficient documentation

## 2016-04-30 DIAGNOSIS — M1990 Unspecified osteoarthritis, unspecified site: Secondary | ICD-10-CM | POA: Insufficient documentation

## 2016-04-30 DIAGNOSIS — Z853 Personal history of malignant neoplasm of breast: Secondary | ICD-10-CM | POA: Insufficient documentation

## 2016-04-30 DIAGNOSIS — I10 Essential (primary) hypertension: Secondary | ICD-10-CM | POA: Insufficient documentation

## 2016-04-30 HISTORY — PX: PORT-A-CATH REMOVAL: SHX5289

## 2016-04-30 HISTORY — PX: MASS EXCISION: SHX2000

## 2016-04-30 LAB — GLUCOSE, CAPILLARY
Glucose-Capillary: 113 mg/dL — ABNORMAL HIGH (ref 65–99)
Glucose-Capillary: 113 mg/dL — ABNORMAL HIGH (ref 65–99)

## 2016-04-30 SURGERY — EXCISION MASS
Anesthesia: General | Site: Chest | Laterality: Right

## 2016-04-30 MED ORDER — DEXAMETHASONE SODIUM PHOSPHATE 10 MG/ML IJ SOLN
INTRAMUSCULAR | Status: AC
  Filled 2016-04-30: qty 1

## 2016-04-30 MED ORDER — TRAMADOL HCL 50 MG PO TABS: 50.0000 mg | ORAL_TABLET | Freq: Four times a day (QID) | ORAL | 1 refills | Status: DC | PRN

## 2016-04-30 MED ORDER — GLYCOPYRROLATE 0.2 MG/ML IJ SOLN: 0.2000 mg | Freq: Once | INTRAMUSCULAR | Status: DC | PRN

## 2016-04-30 MED ORDER — BUPIVACAINE-EPINEPHRINE 0.25% -1:200000 IJ SOLN
INTRAMUSCULAR | Status: DC | PRN
Start: 2016-04-30 — End: 2016-04-30
  Administered 2016-04-30: 20 mL

## 2016-04-30 MED ORDER — MIDAZOLAM HCL 2 MG/2ML IJ SOLN
INTRAMUSCULAR | Status: AC
  Filled 2016-04-30: qty 2

## 2016-04-30 MED ORDER — CEFAZOLIN SODIUM-DEXTROSE 2-4 GM/100ML-% IV SOLN
INTRAVENOUS | Status: AC
  Filled 2016-04-30: qty 100

## 2016-04-30 MED ORDER — SCOPOLAMINE 1 MG/3DAYS TD PT72: 1.0000 | MEDICATED_PATCH | Freq: Once | TRANSDERMAL | Status: DC | PRN

## 2016-04-30 MED ORDER — MIDAZOLAM HCL 2 MG/2ML IJ SOLN
1.0000 mg | INTRAMUSCULAR | Status: DC | PRN
  Administered 2016-04-30 (×2): 1 mg via INTRAVENOUS

## 2016-04-30 MED ORDER — ONDANSETRON HCL 4 MG/2ML IJ SOLN
INTRAMUSCULAR | Status: DC | PRN
  Administered 2016-04-30: 4 mg via INTRAVENOUS

## 2016-04-30 MED ORDER — ONDANSETRON HCL 4 MG/2ML IJ SOLN
INTRAMUSCULAR | Status: AC
  Filled 2016-04-30: qty 2

## 2016-04-30 MED ORDER — PROPOFOL 10 MG/ML IV BOLUS
INTRAVENOUS | Status: AC
  Filled 2016-04-30: qty 20

## 2016-04-30 MED ORDER — LIDOCAINE 2% (20 MG/ML) 5 ML SYRINGE
INTRAMUSCULAR | Status: DC | PRN
  Administered 2016-04-30: 50 mg via INTRAVENOUS

## 2016-04-30 MED ORDER — CEFAZOLIN SODIUM-DEXTROSE 2-4 GM/100ML-% IV SOLN
2.0000 g | INTRAVENOUS | Status: AC
  Administered 2016-04-30: 2 g via INTRAVENOUS

## 2016-04-30 MED ORDER — MEPERIDINE HCL 25 MG/ML IJ SOLN: 6.2500 mg | INTRAMUSCULAR | Status: DC | PRN

## 2016-04-30 MED ORDER — FENTANYL CITRATE (PF) 100 MCG/2ML IJ SOLN
50.0000 ug | INTRAMUSCULAR | Status: DC | PRN
  Administered 2016-04-30 (×2): 50 ug via INTRAVENOUS

## 2016-04-30 MED ORDER — PROMETHAZINE HCL 25 MG/ML IJ SOLN: 6.2500 mg | INTRAMUSCULAR | Status: DC | PRN

## 2016-04-30 MED ORDER — CHLORHEXIDINE GLUCONATE CLOTH 2 % EX PADS: 6.0000 | MEDICATED_PAD | Freq: Once | CUTANEOUS | Status: DC

## 2016-04-30 MED ORDER — HYDROMORPHONE HCL 1 MG/ML IJ SOLN
INTRAMUSCULAR | Status: AC
  Filled 2016-04-30: qty 1

## 2016-04-30 MED ORDER — LACTATED RINGERS IV SOLN: INTRAVENOUS | Status: DC

## 2016-04-30 MED ORDER — HYDROMORPHONE HCL 1 MG/ML IJ SOLN
0.2500 mg | INTRAMUSCULAR | Status: DC | PRN
  Administered 2016-04-30 (×2): 0.5 mg via INTRAVENOUS

## 2016-04-30 MED ORDER — FENTANYL CITRATE (PF) 100 MCG/2ML IJ SOLN
INTRAMUSCULAR | Status: AC
  Filled 2016-04-30: qty 2

## 2016-04-30 MED ORDER — LACTATED RINGERS IV SOLN
INTRAVENOUS | Status: DC
  Administered 2016-04-30: 10 mL/h via INTRAVENOUS
  Administered 2016-04-30: 11:00:00 via INTRAVENOUS

## 2016-04-30 MED ORDER — PROPOFOL 10 MG/ML IV BOLUS
INTRAVENOUS | Status: DC | PRN
  Administered 2016-04-30: 150 mg via INTRAVENOUS

## 2016-04-30 MED ORDER — LIDOCAINE 2% (20 MG/ML) 5 ML SYRINGE
INTRAMUSCULAR | Status: AC
  Filled 2016-04-30: qty 5

## 2016-04-30 SURGICAL SUPPLY — 45 items
BLADE SURG 10 STRL SS (BLADE) ×4 IMPLANT
BLADE SURG 15 STRL LF DISP TIS (BLADE) ×2 IMPLANT
BLADE SURG 15 STRL SS (BLADE) ×4
CANISTER SUCT 1200ML W/VALVE (MISCELLANEOUS) IMPLANT
CHLORAPREP W/TINT 26ML (MISCELLANEOUS) ×4 IMPLANT
COVER BACK TABLE 60X90IN (DRAPES) ×4 IMPLANT
COVER MAYO STAND STRL (DRAPES) ×4 IMPLANT
DECANTER SPIKE VIAL GLASS SM (MISCELLANEOUS) ×4 IMPLANT
DRAPE LAPAROTOMY 100X72 PEDS (DRAPES) ×4 IMPLANT
DRAPE UTILITY XL STRL (DRAPES) ×4 IMPLANT
ELECT COATED BLADE 2.86 ST (ELECTRODE) ×4 IMPLANT
ELECT REM PT RETURN 9FT ADLT (ELECTROSURGICAL) ×4
ELECTRODE REM PT RTRN 9FT ADLT (ELECTROSURGICAL) ×2 IMPLANT
GAUZE SPONGE 4X4 16PLY XRAY LF (GAUZE/BANDAGES/DRESSINGS) IMPLANT
GLOVE BIO SURGEON STRL SZ7.5 (GLOVE) ×4 IMPLANT
GOWN STRL REUS W/ TWL LRG LVL3 (GOWN DISPOSABLE) ×4 IMPLANT
GOWN STRL REUS W/TWL LRG LVL3 (GOWN DISPOSABLE) ×8
LIQUID BAND (GAUZE/BANDAGES/DRESSINGS) ×6 IMPLANT
NDL HYPO 25X1 1.5 SAFETY (NEEDLE) ×2 IMPLANT
NEEDLE HYPO 25X1 1.5 SAFETY (NEEDLE) ×4 IMPLANT
NS IRRIG 1000ML POUR BTL (IV SOLUTION) ×4 IMPLANT
PACK BASIN DAY SURGERY FS (CUSTOM PROCEDURE TRAY) ×4 IMPLANT
PENCIL BUTTON HOLSTER BLD 10FT (ELECTRODE) ×4 IMPLANT
SLEEVE SCD COMPRESS KNEE MED (MISCELLANEOUS) ×4 IMPLANT
SPONGE LAP 18X18 X RAY DECT (DISPOSABLE) ×4 IMPLANT
SUT CHROMIC 3 0 SH 27 (SUTURE) IMPLANT
SUT ETHILON 3 0 PS 1 (SUTURE) IMPLANT
SUT MON AB 4-0 PC3 18 (SUTURE) ×6 IMPLANT
SUT PROLENE 3 0 PS 2 (SUTURE) IMPLANT
SUT SILK 2 0 FS (SUTURE) IMPLANT
SUT VIC AB 3-0 54X BRD REEL (SUTURE) IMPLANT
SUT VIC AB 3-0 BRD 54 (SUTURE)
SUT VIC AB 3-0 FS2 27 (SUTURE) IMPLANT
SUT VIC AB 3-0 SH 27 (SUTURE) ×8
SUT VIC AB 3-0 SH 27X BRD (SUTURE) ×2 IMPLANT
SUT VIC AB 4-0 RB1 27 (SUTURE)
SUT VIC AB 4-0 RB1 27X BRD (SUTURE) IMPLANT
SUT VICRYL 4-0 PS2 18IN ABS (SUTURE) IMPLANT
SUT VICRYL AB 3 0 TIES (SUTURE) IMPLANT
SYR CONTROL 10ML LL (SYRINGE) ×4 IMPLANT
TOWEL OR 17X24 6PK STRL BLUE (TOWEL DISPOSABLE) ×8 IMPLANT
TOWEL OR NON WOVEN STRL DISP B (DISPOSABLE) ×4 IMPLANT
TUBE CONNECTING 20'X1/4 (TUBING)
TUBE CONNECTING 20X1/4 (TUBING) IMPLANT
YANKAUER SUCT BULB TIP NO VENT (SUCTIONS) IMPLANT

## 2016-04-30 NOTE — Interval H&P Note (Signed)
History and Physical Interval Note:  04/30/2016 11:16 AM  [name]  has presented today for surgery, with the diagnosis of RIGHT BREAST CANCER  The various methods of treatment have been discussed with the patient and family. After consideration of risks, benefits and other options for treatment, the patient has consented to  Procedure(s): EXCISION OF SKIN RIGHT CHEST WALL (Right) REMOVAL PORT-A-CATH (N/A) as a surgical intervention .  The patient's history has been reviewed, patient examined, no change in status, stable for surgery.  I have reviewed the patient's chart and labs.  Questions were answered to the patient's satisfaction.     TOTH III,PAUL S

## 2016-04-30 NOTE — Anesthesia Postprocedure Evaluation (Signed)
Anesthesia Post Note  Patient: [name]  Procedure(s) Performed: Procedure(s) (LRB): EXCISION OF SKIN RIGHT CHEST WALL (Right) REMOVAL PORT-A-CATH (Left)  Patient location during evaluation: PACU Anesthesia Type: General Level of consciousness: awake and alert Pain management: pain level controlled Vital Signs Assessment: post-procedure vital signs reviewed and stable Respiratory status: spontaneous breathing, nonlabored ventilation, respiratory function stable and patient connected to nasal cannula oxygen Cardiovascular status: blood pressure returned to baseline and stable Postop Assessment: no signs of nausea or vomiting Anesthetic complications: no    Last Vitals:  Vitals:   04/30/16 1300 04/30/16 1315  BP: 128/84 134/77  Pulse: 66 65  Resp: 16 15  Temp:      Last Pain:  Vitals:   04/30/16 1330  TempSrc:   PainSc: 0-No pain                 Effie Berkshire

## 2016-04-30 NOTE — Discharge Instructions (Signed)

## 2016-04-30 NOTE — Transfer of Care (Signed)
Immediate Anesthesia Transfer of Care Note  Patient: [name]  Procedure(s) Performed: Procedure(s) with comments: EXCISION OF SKIN RIGHT CHEST WALL (Right) - EXCISION OF SKIN RIGHT CHEST WALL REMOVAL PORT-A-CATH (Left) - REMOVAL PORT-A-CATH  Patient Location: PACU  Anesthesia Type:General  Level of Consciousness: awake, sedated and patient cooperative  Airway & Oxygen Therapy: Patient Spontanous Breathing and Patient connected to face mask oxygen  Post-op Assessment: Report given to RN and Post -op Vital signs reviewed and stable  Post vital signs: Reviewed and stable  Last Vitals:  Vitals:   04/30/16 1017  BP: (!) 155/87  Pulse: 66  Resp: 18  Temp: 36.7 C    Last Pain:  Vitals:   04/30/16 1017  TempSrc: Oral  PainSc: 3       Patients Stated Pain Goal: 1 ([id number])  Complications: No apparent anesthesia complications

## 2016-04-30 NOTE — Anesthesia Preprocedure Evaluation (Addendum)
Anesthesia Evaluation  Patient identified by MRN, date of birth, ID band Patient awake    Reviewed: Allergy & Precautions, NPO status , Patient's Chart, lab work & pertinent test results, reviewed documented beta blocker date and time   Airway Mallampati: II  TM Distance: >3 FB Neck ROM: Full    Dental  (+) Teeth Intact, Dental Advisory Given   Pulmonary neg pulmonary ROS, Current Smoker,    breath sounds clear to auscultation       Cardiovascular hypertension, Pt. on medications and Pt. on home beta blockers  Rhythm:Regular Rate:Normal     Neuro/Psych  Headaches, negative psych ROS   GI/Hepatic Neg liver ROS, GERD  Medicated,  Endo/Other  diabetes, Type 2, Oral Hypoglycemic Agents  Renal/GU negative Renal ROS  negative genitourinary   Musculoskeletal  (+) Arthritis , Osteoarthritis,    Abdominal (+) + obese,   Peds negative pediatric ROS (+)  Hematology negative hematology ROS (+)   Anesthesia Other Findings   Reproductive/Obstetrics negative OB ROS                            Lab Results  Component Value Date   WBC 5.6 03/17/2016   HGB 12.6 04/28/2016   HCT 37.0 04/28/2016   MCV 96.4 03/17/2016   PLT 294 03/17/2016   Lab Results  Component Value Date   CREATININE 0.80 04/28/2016   BUN 10 04/28/2016   NA 143 04/28/2016   K 3.1 (L) 04/28/2016   CL 101 04/28/2016   CO2 25 03/17/2016   Lab Results  Component Value Date   INR 0.90 01/29/2012   INR 1.01 01/22/2011   12/2015 EKG: normal sinus rhythm.  01/2016 Echo - Left ventricle: The cavity size was normal. There was mild   concentric hypertrophy. Systolic function was normal. The   estimated ejection fraction was in the range of 60% to 65%. Wall   motion was normal; there were no regional wall motion   abnormalities. Doppler parameters are consistent with abnormal   left ventricular relaxation (grade 1 diastolic  dysfunction). - Impressions: Lateral s&' = 11.1 cm/s. GLS - 16.9% (underestimated   due to poor endocardial tracking).   Anesthesia Physical Anesthesia Plan  ASA: III  Anesthesia Plan: General   Post-op Pain Management:    Induction: Intravenous  Airway Management Planned: LMA  Additional Equipment:   Intra-op Plan:   Post-operative Plan: Extubation in OR  Informed Consent: I have reviewed the patients History and Physical, chart, labs and discussed the procedure including the risks, benefits and alternatives for the proposed anesthesia with the patient or authorized representative who has indicated his/her understanding and acceptance.     Plan Discussed with:   Anesthesia Plan Comments:         Anesthesia Quick Evaluation

## 2016-04-30 NOTE — H&P (Signed)
[name]  Location: [facility] Patient #: [id number] DOB: 13-Nov-1960 Married / Language: English / Race: Black or African American Female   History of Present Illness  The patient is a 55 year old female who presents for a follow-up for Breast cancer. The patient is a 55 year old black female who is about 6 months status post right modified radical mastectomy for a T2 N0 right breast cancer. She was ER and PR positive and HER-2 negative with a Ki-67 of 10%. She had to stop the chemotherapy secondary to neuropathy. She is now ready to have her port removed and would also like to have some excess skin from the medial portion of the mastectomy incision removed.   Allergies  Latex Sulfo Lo *DERMATOLOGICALS* Codeine Sulfate *ANALGESICS - OPIOID*  Medication History  Ondansetron HCl (8MG Tablet, Oral) Active. Elavil (25MG Tablet, Oral) Active. Norvasc (5MG Tablet, Oral) Active. Azithromycin (250MG Tablet, Oral) Active. Lisinopril (10MG Tablet, Oral) Active. MetFORMIN HCl (500MG Tablet, Oral) Active. Methadone HCl (10MG Tablet, Oral) Active. Metoprolol Succinate (50MG Tablet ER, Oral) Active. PriLOSEC (40MG Capsule DR, Oral) Active. Potassium Chloride (10MEQ Tablet ER, Oral) Active. Restoril (30MG Capsule, Oral) Active. Medications Reconciled    Review of Systems  General Present- Night Sweats. Not Present- Appetite Loss, Chills, Fatigue, Fever, Weight Gain and Weight Loss. Skin Not Present- Change in Wart/Mole, Dryness, Hives, Jaundice, New Lesions, Non-Healing Wounds, Rash and Ulcer. HEENT Not Present- Earache, Hearing Loss, Hoarseness, Nose Bleed, Oral Ulcers, Ringing in the Ears, Seasonal Allergies, Sinus Pain, Sore Throat, Visual Disturbances, Wears glasses/contact lenses and Yellow Eyes. Respiratory Not Present- Bloody sputum, Chronic Cough, Difficulty Breathing, Snoring and Wheezing. Breast Present- Breast Mass. Not Present- Breast Pain,  Nipple Discharge and Skin Changes. Cardiovascular Not Present- Chest Pain, Difficulty Breathing Lying Down, Leg Cramps, Palpitations, Rapid Heart Rate, Shortness of Breath and Swelling of Extremities. Gastrointestinal Not Present- Abdominal Pain, Bloating, Bloody Stool, Change in Bowel Habits, Chronic diarrhea, Constipation, Difficulty Swallowing, Excessive gas, Gets full quickly at meals, Hemorrhoids, Indigestion, Nausea, Rectal Pain and Vomiting. Female Genitourinary Not Present- Frequency, Nocturia, Painful Urination, Pelvic Pain and Urgency. Neurological Present- Trouble walking. Not Present- Decreased Memory, Fainting, Headaches, Numbness, Seizures, Tingling, Tremor and Weakness. Psychiatric Not Present- Anxiety, Bipolar, Change in Sleep Pattern, Depression, Fearful and Frequent crying. Endocrine Not Present- Cold Intolerance, Excessive Hunger, Hair Changes, Heat Intolerance, Hot flashes and New Diabetes. Hematology Not Present- Easy Bruising, Excessive bleeding, Gland problems, HIV and Persistent Infections.  Vitals  Weight: 256 lb Height: 67in Body Surface Area: 2.25 m Body Mass Index: 40.09 kg/m  Temp.: 97.66F(Temporal)  Pulse: 80 (Regular)  BP: 132/80 (Sitting, Left Arm, Standard)       Physical Exam General Mental Status-Alert. General Appearance-Consistent with stated age. Hydration-Well hydrated. Voice-Normal.  Head and Neck Head-normocephalic, atraumatic with no lesions or palpable masses. Trachea-midline. Thyroid Gland Characteristics - normal size and consistency.  Eye Eyeball - Bilateral-Extraocular movements intact. Sclera/Conjunctiva - Bilateral-No scleral icterus.  Chest and Lung Exam Chest and lung exam reveals -quiet, even and easy respiratory effort with no use of accessory muscles and on auscultation, normal breath sounds, no adventitious sounds and normal vocal resonance. Inspection Chest Wall - Normal. Back -  normal.  Breast Note: The right mastectomy incision has healed nicely with no sign of infection or seroma. The skin flaps are healthy. She does have some excess skin along the very medial portion of the mastectomy incision. There is no palpable mass of the right chest wall. There is no  palpable mass of the left breast. There is no palpable axillary, supraclavicular, or cervical lymphadenopathy.   Cardiovascular Cardiovascular examination reveals -normal heart sounds, regular rate and rhythm with no murmurs and normal pedal pulses bilaterally.  Abdomen Inspection Inspection of the abdomen reveals - No Hernias. Skin - Scar - no surgical scars. Palpation/Percussion Palpation and Percussion of the abdomen reveal - Soft, Non Tender, No Rebound tenderness, No Rigidity (guarding) and No hepatosplenomegaly. Auscultation Auscultation of the abdomen reveals - Bowel sounds normal.  Neurologic Neurologic evaluation reveals -alert and oriented x 3 with no impairment of recent or remote memory. Mental Status-Normal.  Musculoskeletal Normal Exam - Left-Upper Extremity Strength Normal and Lower Extremity Strength Normal. Normal Exam - Right-Upper Extremity Strength Normal and Lower Extremity Strength Normal.  Lymphatic Head & Neck  General Head & Neck Lymphatics: Bilateral - Description - Normal. Axillary  General Axillary Region: Bilateral - Description - Normal. Tenderness - Non Tender. Femoral & Inguinal  Generalized Femoral & Inguinal Lymphatics: Bilateral - Description - Normal. Tenderness - Non Tender.    Assessment & Plan BREAST CANCER OF LOWER-OUTER QUADRANT OF RIGHT FEMALE BREAST (C50.511) Impression: The patient is about 6 months status post right modified radical mastectomy for breast cancer. She has stopped chemotherapy secondary to neuropathy. At this point she would like to have her port removed and some excess skin removed from the medial portion of the mastectomy. I  have discussed with her in detail the risks and benefits of the operation to do this as well as some of the technical aspects and she understands and wishes to proceed. Once she has healed and she may proceed with radiation. Current Plans Follow up with [place] in the office in 6 MONTHS.  Call us sooner as needed.

## 2016-04-30 NOTE — Op Note (Addendum)
04/30/2016  12:27 PM  PATIENT:  [name]  55 y.o. female  PRE-OPERATIVE DIAGNOSIS:  RIGHT BREAST CANCER  POST-OPERATIVE DIAGNOSIS:  RIGHT BREAST CANCER  PROCEDURE:  Procedure(s) with comments: EXCISION OF SKIN RIGHT CHEST WALL (Right) REMOVAL PORT-A-CATH (Left)  SURGEON:  Surgeon(s) and Role:    * Jovita Kussmaul, MD - Primary  PHYSICIAN ASSISTANT:   ASSISTANTS: none   ANESTHESIA:   local and general  EBL:  No intake/output data recorded.  BLOOD ADMINISTERED:none  DRAINS: none   LOCAL MEDICATIONS USED:  MARCAINE     SPECIMEN:  Source of Specimen:  right medial mastectomy incision  DISPOSITION OF SPECIMEN:  PATHOLOGY  COUNTS:  YES  TOURNIQUET:  * No tourniquets in log *  DICTATION: .Dragon Dictation   After informed consent was obtained the patient was brought to the operating room and placed in the supine position on the operating room table. After adequate induction of general anesthesia the patient's bilateral chest area was prepped with ChloraPrep, allowed to dry, and draped in usual sterile manner. An appropriate timeout was performed. Attention was first turned to the left chest wall where the Port-A-Cath was. The area around the port was infiltrated with quarter percent Marcaine. A small incision was made with a 15 blade knife through the old incision. The incision was carried through the subcutaneous tissue sharply with the 15 blade knife until the port was identified. The capsule surrounding the port was opened sharply with the 15 blade knife. The 2 anchoring stitches were grasped with hemostats and divided and removed. With gentle traction the port was then removed from the patient without difficulty. Pressure was held for several minutes until the area was completely hemostatic. The deep layer of the wound was then closed with interrupted 3-0 Vicryl stitches. The skin was then closed with interrupted 4-0 Monocryl subcuticular stitches. Attention was then turned  to the right chest wall. There was a dogear of skin along the medial of the old mastectomy incision. This was infiltrated with quarter percent Marcaine. The dogear was excised sharply with a 15 blade knife. The area of skin measured approximately 3 x 2 cm. Hemostasis was achieved using the Bovie electrocautery. The deep layer of the wound was then closed with interrupted 3-0 Vicryl stitches. The skin was then closed with interrupted 4-0 Monocryl subcuticular stitches. Dermabond dressings were applied. The patient tolerated the procedure well. At the end of the case all needle sponge and instrument counts were correct. The patient was then awakened and taken to recovery in stable condition  PLAN OF CARE: Discharge to home after PACU  PATIENT DISPOSITION:  PACU - hemodynamically stable.   Delay start of Pharmacological VTE agent (>24hrs) due to surgical blood loss or risk of bleeding: not applicable

## 2016-05-01 ENCOUNTER — Encounter (HOSPITAL_BASED_OUTPATIENT_CLINIC_OR_DEPARTMENT_OTHER): Payer: Self-pay | Admitting: General Surgery

## 2016-05-18 ENCOUNTER — Other Ambulatory Visit: Payer: Self-pay | Admitting: Family Medicine

## 2016-05-20 ENCOUNTER — Telehealth: Payer: Self-pay | Admitting: Radiation Oncology

## 2016-05-20 NOTE — Telephone Encounter (Signed)
Confirmed 05/22/16 simulation appointment for 0900.

## 2016-05-21 NOTE — Progress Notes (Signed)
  Radiation Oncology         [phone] ________________________________  Name: [name] MRN: [id number]  Date: 05/22/2016  DOB: 1961/04/03  SIMULATION AND TREATMENT PLANNING NOTE    ICD-9-CM ICD-10-CM   1. 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  Identity was confirmed.  All relevant records and images related to the planned course of therapy were reviewed.  The patient freely provided informed written consent to proceed with treatment after reviewing the details related to the planned course of therapy. The consent form was witnessed and verified by the simulation staff.  Then, the patient was set-up in a stable reproducible  supine position for radiation therapy.  CT images were obtained.  Surface markings were placed.  The CT images were loaded into the planning software.  Then the target and avoidance structures were contoured.  Treatment planning then occurred.  The radiation prescription was entered and confirmed.  Then, I designed and supervised the construction of a total of one medically necessary complex treatment device for positioning and 3 MLCs for shielding lungs in two tangents and one supraclavicular beam.  I have requested : 3D Simulation  I have requested a DVH of the following structures: left lung, right lung, heart, and lumpectomy site.    PLAN:  The patient will receive 50 Gy in 25 fractions then boost mastectomy scar to 60 Gy.  ________________________________  Sheral Apley. Tammi Klippel, M.D.

## 2016-05-22 ENCOUNTER — Ambulatory Visit
Admission: RE | Admit: 2016-05-22 | Discharge: 2016-05-22 | Disposition: A | Discharge status: Home / Self Care | Payer: 59 | Source: Ambulatory Visit | Attending: Radiation Oncology | Admitting: Radiation Oncology

## 2016-05-22 DIAGNOSIS — C50311 Malignant neoplasm of lower-inner quadrant of right female breast: Secondary | ICD-10-CM

## 2016-05-22 DIAGNOSIS — Z510 Encounter for antineoplastic radiation therapy: Secondary | ICD-10-CM | POA: Diagnosis not present

## 2016-05-26 ENCOUNTER — Encounter (HOSPITAL_COMMUNITY): Payer: Self-pay | Admitting: Emergency Medicine

## 2016-05-26 ENCOUNTER — Emergency Department (HOSPITAL_COMMUNITY): Payer: 59

## 2016-05-26 ENCOUNTER — Emergency Department (HOSPITAL_COMMUNITY)
Admission: EM | Admit: 2016-05-26 | Discharge: 2016-05-26 | Disposition: A | Discharge status: Home / Self Care | Payer: 59 | Attending: Emergency Medicine | Admitting: Emergency Medicine

## 2016-05-26 DIAGNOSIS — Z7984 Long term (current) use of oral hypoglycemic drugs: Secondary | ICD-10-CM | POA: Diagnosis not present

## 2016-05-26 DIAGNOSIS — E119 Type 2 diabetes mellitus without complications: Secondary | ICD-10-CM | POA: Diagnosis not present

## 2016-05-26 DIAGNOSIS — Z853 Personal history of malignant neoplasm of breast: Secondary | ICD-10-CM | POA: Diagnosis not present

## 2016-05-26 DIAGNOSIS — M25572 Pain in left ankle and joints of left foot: Secondary | ICD-10-CM | POA: Insufficient documentation

## 2016-05-26 DIAGNOSIS — Z79899 Other long term (current) drug therapy: Secondary | ICD-10-CM | POA: Insufficient documentation

## 2016-05-26 DIAGNOSIS — I10 Essential (primary) hypertension: Secondary | ICD-10-CM | POA: Diagnosis not present

## 2016-05-26 DIAGNOSIS — F17210 Nicotine dependence, cigarettes, uncomplicated: Secondary | ICD-10-CM | POA: Diagnosis not present

## 2016-05-26 MED ORDER — DICLOFENAC SODIUM 3 % TD GEL: 4.0000 g | Freq: Four times a day (QID) | TRANSDERMAL | 0 refills | Status: DC

## 2016-05-26 NOTE — ED Notes (Signed)
Ortho tech paged  

## 2016-05-26 NOTE — Discharge Instructions (Signed)
Please continue to ice and use the voltaren gel 3-4 times a day. Follow up with Orthopedics if your symptoms continue

## 2016-05-26 NOTE — ED Notes (Signed)
PT DECLINED CRUTCHES. HAS A CANE AND WALKER AT HOME.

## 2016-05-26 NOTE — ED Triage Notes (Addendum)
Pt complaint of left ankle swelling and pain for 1.5 weeks; pt receiving radiation for breast cancer. Pt denies injury. Pt takes lasix.

## 2016-05-26 NOTE — ED Notes (Signed)
Ortho at bedside.

## 2016-05-28 DIAGNOSIS — Z510 Encounter for antineoplastic radiation therapy: Secondary | ICD-10-CM | POA: Diagnosis not present

## 2016-05-28 NOTE — ED Provider Notes (Signed)
[facility] DEPT Provider Note   CSN: [id number] Arrival date & time: 05/26/16  1609     History   Chief Complaint Chief Complaint  Patient presents with  . Joint Swelling  . Ankle Pain    HPI [name] is a 55 y.o. female who presents with L ankle pain. PMH significant for breast cancer, DM, cervical DDD, low back pain, osteoarthritis, hx of bilateral knee replacements requiring multiple revisions. She states over the past week and a half she has had worsening atraumatic L ankle pain and swelling. She has had increasing difficulty ambulating. She normally ambulates with a cane but also uses a walker at times. She does go to pain management and has been taking her pain meds as prescribed. She is unable to tolerate NSAIDs due to gastritis. Denies numbness or tingling. Has never had this problem before.  HPI  Past Medical History:  Diagnosis Date  . Breast cancer ([place])   . Breast cancer of lower-inner quadrant of right female breast ([place]) 08/08/2015  . Colon polyps   . DDD (degenerative disc disease), cervical   . Degenerative joint disease   . Diabetes mellitus without complication ([place])   . Diverticulosis   . Gallstones   . GERD (gastroesophageal reflux disease)   . Headache(784.0)    migraine like per patient  . Hypertension   . Low back pain    dr phillips, pain management  . Osteoarthritis     Patient Active Problem List   Diagnosis Date Noted  . Left groin pain 11/30/2015  . Severe obesity (BMI >= 40) ([place]) 10/26/2015  . Breast cancer of lower-inner quadrant of right female breast ([place]) 08/08/2015  . Diabetes mellitus without complication ([place]) [id number]  . Paraspinous mass, left 03/31/2013  . Internal hemorrhoid 02/17/2013  . Abdominal pain, other specified site 02/17/2013  . Neoplasm of soft tissue, left shoulder and left upper arm 08/03/2012  . Lightheadedness 01/29/2012  . Cholelithiasis with cholecystitis 10/08/2011  . Hypokalemia 09/17/2011  .  GERD 08/08/2010  . GOUT 12/18/2008  . Essential hypertension 03/15/2007  . HEADACHE 03/15/2007    Past Surgical History:  Procedure Laterality Date  . CHOLECYSTECTOMY  10/28/2011   Procedure: LAPAROSCOPIC CHOLECYSTECTOMY WITH INTRAOPERATIVE CHOLANGIOGRAM;  Surgeon: Earnstine Regal, MD;  Location: [facility];  Service: General;  Laterality: N/A;  . COLONOSCOPY WITH ESOPHAGOGASTRODUODENOSCOPY (EGD)  02/03/2013   with Propofol  . EXAM UNDER ANESTHESIA WITH MANIPULATION OF KNEE Right 05/30/2003  . EXAM UNDER ANESTHESIA WITH MANIPULATION OF KNEE Left 12/27/2002  . GANGLION CYST EXCISION Right    right wrist  . KNEE ARTHROSCOPY Bilateral 01/02/2000  . KNEE ARTHROSCOPY Right   . LAPAROSCOPIC VAGINAL HYSTERECTOMY WITH SALPINGO OOPHORECTOMY Bilateral 07/13/2006  . LIPOMA EXCISION Right 02/21/2008   hip  . MASS EXCISION  08/24/2012   Procedure: EXCISION MASS;  Surgeon: Earnstine Regal, MD;  Location: [facility];  Service: General;  Laterality: Left;  excisie soft tissue masses left shoulder(back) & left upper arm  . MASS EXCISION Left 01/24/2014   Procedure: EXCISION SOFT TISSUE MASSES LOWER LEFT BACK;  Surgeon: Earnstine Regal, MD;  Location: [facility];  Service: General;  Laterality: Left;  Marland Kitchen MASS EXCISION Right 04/30/2016   Procedure: EXCISION OF SKIN RIGHT CHEST WALL;  Surgeon: Autumn Messing III, MD;  Location: [facility];  Service: General;  Laterality: Right;  EXCISION OF SKIN RIGHT CHEST WALL  . MASTECTOMY W/ SENTINEL NODE BIOPSY Right 10/01/2015  Procedure: RIGHT MASTECTOMY WITH SENTINEL LYMPH NODE BIOPSY;  Surgeon: Autumn Messing III, MD;  Location: [facility];  Service: General;  Laterality: Right;  . PORT-A-CATH REMOVAL Left 04/30/2016   Procedure: REMOVAL PORT-A-CATH;  Surgeon: Autumn Messing III, MD;  Location: [facility];  Service: General;  Laterality: Left;  REMOVAL PORT-A-CATH  . PORTACATH PLACEMENT Left 11/01/2015   Procedure: INSERTION PORT-A-CATH;   Surgeon: Autumn Messing III, MD;  Location: [facility];  Service: General;  Laterality: Left;  . REPLACEMENT UNICONDYLAR JOINT KNEE Left 04/20/2001  . REVISION TOTAL KNEE ARTHROPLASTY Right 06/18/2004  . REVISION TOTAL KNEE ARTHROPLASTY Left 11/15/2002  . REVISION TOTAL KNEE ARTHROPLASTY Left 10/12/2001  . REVISION TOTAL KNEE ARTHROPLASTY Right 07/09/2015  . TONSILLECTOMY    . TOTAL KNEE ARTHROPLASTY Right 04/25/2003  . TOTAL KNEE ARTHROPLASTY Left 06/25/2009    OB History    No data available       Home Medications    Prior to Admission medications   Medication Sig Start Date End Date Taking? Authorizing Provider  amitriptyline (ELAVIL) 25 MG tablet Take 25 mg by mouth at bedtime.    Yes Historical Provider, MD  amLODipine (NORVASC) 5 MG tablet TAKE 1 TABLET(5 MG) BY MOUTH TWICE DAILY 04/07/16  Yes Laurey Morale, MD  furosemide (LASIX) 20 MG tablet Take 1 tablet (20 mg total) by mouth daily. 04/07/16  Yes Laurey Morale, MD  gabapentin (NEURONTIN) 300 MG capsule Take 1 capsule (300 mg total) by mouth at bedtime. 12/31/15  Yes Chauncey Cruel, MD  lisinopril (PRINIVIL,ZESTRIL) 10 MG tablet TAKE 1 TABLET(10 MG) BY MOUTH DAILY 04/07/16  Yes Laurey Morale, MD  metFORMIN (GLUCOPHAGE) 500 MG tablet TAKE 1 TABLET(500 MG) BY MOUTH TWICE DAILY WITH A MEAL 04/28/16  Yes Laurey Morale, MD  methadone (DOLOPHINE) 10 MG tablet Take 10 mg by mouth every 8 (eight) hours as needed for moderate pain.    Yes Historical Provider, MD  metoprolol (LOPRESSOR) 50 MG tablet Take 100 mg by mouth 2 (two) times daily.   Yes Historical Provider, MD  omeprazole (PRILOSEC) 40 MG capsule TAKE 1 CAPSULE(40 MG) BY MOUTH DAILY 02/25/16  Yes Laurey Morale, MD  ondansetron [facility]) 8 MG tablet Reported on 04/07/2016 11/05/15  Yes Historical Provider, MD  oxyCODONE (ROXICODONE) 15 MG immediate release tablet Take 15 mg by mouth every 6 (six) hours as needed for pain. Reported on 12/10/2015 11/28/15  Yes Historical Provider,  MD  potassium chloride (K-DUR) 10 MEQ tablet Take 3 tablets (30 mEq total) by mouth 2 (two) times daily. 06/18/15  Yes Laurey Morale, MD  temazepam (RESTORIL) 30 MG capsule TAKE 1 CAPSULE BY MOUTH EVERY DAY AT BEDTIME AS NEEDED Patient taking differently: TAKE 1 CAPSULE BY MOUTH EVERY DAY AT BEDTIME AS NEEDED PRN SLEEP 03/26/16  Yes Laurey Morale, MD  Diclofenac Sodium 3 % GEL Place 4 g onto the skin 4 (four) times daily. 05/26/16   Recardo Evangelist, PA-C  traMADol (ULTRAM) 50 MG tablet Take 1-2 tablets (50-100 mg total) by mouth every 6 (six) hours as needed. Patient not taking: Reported on 05/26/2016 04/30/16   Jovita Kussmaul, MD    Family History Family History  Problem Relation Age of Onset  . Stomach cancer Mother   . Heart disease Father   . Prostate cancer Father   . Kidney cancer Sister     one out of the four  . Hypertension Sister   .  Hypertension Brother   . Crohn's disease Other     nephew  . Colon cancer Maternal Grandmother     Social History Social History  Substance Use Topics  . Smoking status: Current Every Day Smoker    Packs/day: 0.25    Years: 20.00    Types: Cigarettes  . Smokeless tobacco: Former Systems developer    Quit date: 09/30/2015     Comment: 1/2 pack per day  . Alcohol use No     Allergies   Codeine; Latex; and Sulfonamide derivatives   Review of Systems Review of Systems  Musculoskeletal: Positive for arthralgias, gait problem and joint swelling.  Neurological: Negative for numbness.     Physical Exam Updated Vital Signs BP 144/81 (BP Location: Left Arm)   Pulse 66   Temp 97.8 F (36.6 C) (Oral)   Resp 16   Ht 5\' 7"  (1.702 m)   Wt 116.1 kg   SpO2 100%   BMI 40.10 kg/m   Physical Exam   ED Treatments / Results  Labs (all labs ordered are listed, but only abnormal results are displayed) Labs Reviewed - No data to display  EKG  EKG Interpretation None       Radiology No results found.  Procedures Procedures (including critical  care time)  Medications Ordered in ED Medications - No data to display   Initial Impression / Assessment and Plan / ED Course  I have reviewed the triage vital signs and the nursing notes.  Pertinent labs & imaging results that were available during my care of the patient were reviewed by me and considered in my medical decision making (see chart for details).  Clinical Course   55 year old female presents with L ankle pain. Unclear etiology however will treat for MSK pain. Xray negative for bony pathology, joint effusion, or soft tissue abnormality.  ASO brace and crutches ordered however patient declined crutches. Advised f/u with Orthopedics and will rx Voltaren gel due to NSAID intolerance. Patient is NAD, non-toxic, with stable VS. Patient is informed of clinical course, understands medical decision making process, and agrees with plan. Opportunity for questions provided and all questions answered. Return precautions given.   Final Clinical Impressions(s) / ED Diagnoses   Final diagnoses:  Left ankle pain    New Prescriptions Discharge Medication List as of 05/26/2016  6:05 PM    START taking these medications   Details  Diclofenac Sodium 3 % GEL Place 4 g onto the skin 4 (four) times daily., Starting Mon 05/26/2016, Print         Recardo Evangelist, PA-C 05/28/16 2159    Leo Grosser, MD 05/31/16 [phone]

## 2016-05-29 ENCOUNTER — Other Ambulatory Visit: Payer: Self-pay | Admitting: Family Medicine

## 2016-05-30 ENCOUNTER — Inpatient Hospital Stay
Admission: RE | Admit: 2016-05-30 | Discharge: 2016-05-30 | Disposition: A | Discharge status: Home / Self Care | Payer: Self-pay | Source: Ambulatory Visit | Attending: Radiation Oncology | Admitting: Radiation Oncology

## 2016-05-30 ENCOUNTER — Ambulatory Visit
Admission: RE | Admit: 2016-05-30 | Discharge: 2016-05-30 | Disposition: A | Discharge status: Home / Self Care | Payer: 59 | Source: Ambulatory Visit | Attending: Radiation Oncology | Admitting: Radiation Oncology

## 2016-05-30 ENCOUNTER — Telehealth: Payer: Self-pay | Admitting: Oncology

## 2016-05-30 VITALS — Wt 256.2 lb

## 2016-05-30 DIAGNOSIS — C50311 Malignant neoplasm of lower-inner quadrant of right female breast: Secondary | ICD-10-CM

## 2016-05-30 DIAGNOSIS — Z510 Encounter for antineoplastic radiation therapy: Secondary | ICD-10-CM | POA: Diagnosis not present

## 2016-05-30 MED ORDER — RADIAPLEXRX EX GEL
Freq: Once | CUTANEOUS | Status: AC
  Administered 2016-05-30: 14:00:00 via TOPICAL

## 2016-05-30 MED ORDER — ALRA NON-METALLIC DEODORANT (RAD-ONC)
1.0000 "application " | Freq: Once | TOPICAL | Status: AC
  Administered 2016-05-30: 1 via TOPICAL

## 2016-05-30 NOTE — Telephone Encounter (Signed)
Unable to reach pt to inform her of r/s Sept appt to 10/20 per LOS

## 2016-05-30 NOTE — Progress Notes (Addendum)
Patient education done, Joaquim Lai RN  business card, radaiplex gel and alra deodorant ,radiation therapy and you book given,discussed ways to manage side effects,skin irritation, swelling of breast ,soreness of breast, fatigue, pain, use radiapelx and alra after treatments daily starting 06/02/16 and at bedtime,  Increase protein in diet, drink plenty water/fluids,stay hydrated, use of electric shaver only, no under wire bras if possible, verbal understanding, teach back given, There were no vitals taken for this visit.

## 2016-06-02 ENCOUNTER — Other Ambulatory Visit: Payer: Self-pay | Admitting: Family Medicine

## 2016-06-02 ENCOUNTER — Ambulatory Visit
Admission: RE | Admit: 2016-06-02 | Discharge: 2016-06-02 | Disposition: A | Discharge status: Home / Self Care | Payer: 59 | Source: Ambulatory Visit | Attending: Radiation Oncology | Admitting: Radiation Oncology

## 2016-06-02 DIAGNOSIS — Z510 Encounter for antineoplastic radiation therapy: Secondary | ICD-10-CM | POA: Diagnosis not present

## 2016-06-03 ENCOUNTER — Ambulatory Visit
Admission: RE | Admit: 2016-06-03 | Discharge: 2016-06-03 | Disposition: A | Discharge status: Home / Self Care | Payer: 59 | Source: Ambulatory Visit | Attending: Radiation Oncology | Admitting: Radiation Oncology

## 2016-06-03 DIAGNOSIS — Z510 Encounter for antineoplastic radiation therapy: Secondary | ICD-10-CM | POA: Diagnosis not present

## 2016-06-04 ENCOUNTER — Ambulatory Visit
Admission: RE | Admit: 2016-06-04 | Discharge: 2016-06-04 | Disposition: A | Discharge status: Home / Self Care | Payer: 59 | Source: Ambulatory Visit | Attending: Radiation Oncology | Admitting: Radiation Oncology

## 2016-06-04 DIAGNOSIS — Z510 Encounter for antineoplastic radiation therapy: Secondary | ICD-10-CM | POA: Diagnosis not present

## 2016-06-05 ENCOUNTER — Ambulatory Visit
Admission: RE | Admit: 2016-06-05 | Discharge: 2016-06-05 | Disposition: A | Discharge status: Home / Self Care | Payer: 59 | Source: Ambulatory Visit | Attending: Radiation Oncology | Admitting: Radiation Oncology

## 2016-06-05 DIAGNOSIS — Z510 Encounter for antineoplastic radiation therapy: Secondary | ICD-10-CM | POA: Diagnosis not present

## 2016-06-06 ENCOUNTER — Ambulatory Visit
Admission: RE | Admit: 2016-06-06 | Discharge: 2016-06-06 | Disposition: A | Discharge status: Home / Self Care | Payer: 59 | Source: Ambulatory Visit | Attending: Radiation Oncology | Admitting: Radiation Oncology

## 2016-06-06 ENCOUNTER — Encounter: Payer: Self-pay | Admitting: Radiation Oncology

## 2016-06-06 ENCOUNTER — Other Ambulatory Visit: Payer: Self-pay | Admitting: *Deleted

## 2016-06-06 VITALS — BP 142/86 | HR 67 | Temp 98.2°F | Ht 67.0 in | Wt 252.1 lb

## 2016-06-06 DIAGNOSIS — C50311 Malignant neoplasm of lower-inner quadrant of right female breast: Secondary | ICD-10-CM

## 2016-06-06 DIAGNOSIS — Z510 Encounter for antineoplastic radiation therapy: Secondary | ICD-10-CM | POA: Diagnosis not present

## 2016-06-06 NOTE — Progress Notes (Signed)
  Radiation Oncology         [phone]   Name: [name] MRN: [id number]   Date: 06/06/2016  DOB: [birth year]   Weekly Radiation Therapy Management    ICD-9-CM ICD-10-CM   1. Breast cancer of lower-inner quadrant of right female breast ([place]) 174.3 C50.311     Current Dose: 9 Gy  Planned Dose:  60.4 Gy  Narrative The patient presents for routine under treatment assessment. [name] has received 5 fractions to her right chest wall.  She denies any pain.  Note mild erythema and rash within the treatment field. She states that the rash presented after having a 2nd surgery to remove more excess tissue.  She attributes the rash to the glue used for the 2nd procedure. She reports a " little bit of fatigue." Set-up films were reviewed. The chart was checked.  Physical Findings  height is 5\' 7"  (1.702 m) and weight is 252 lb 1.6 oz (114.4 kg). Her oral temperature is 98.2 F (36.8 C). Her blood pressure is 142/86 (abnormal) and her pulse is 67. . Weight essentially stable.  No significant changes.  Impression The patient is tolerating radiation.  Plan Continue treatment as planned.     Sheral Apley Tammi Klippel, M.D.  This document serves as a record of services personally performed by Tyler Pita, MD. It was created on his behalf by Darcus Austin, a trained medical scribe. The creation of this record is based on the scribe's personal observations and the provider's statements to them. This document has been checked and approved by the attending provider.

## 2016-06-06 NOTE — Progress Notes (Signed)
Ms. [name] has received 4 fractions to her right chest wall.  She denies any pain.  Note mile erythema and rash within the tx field. She states that the rash presented after having a 2nd surgery to remove more excess tissue.  She  Attributes the rash to the glue used for the 2nd procedure. She reports a " little bit of fatigue."

## 2016-06-09 ENCOUNTER — Telehealth: Payer: Self-pay | Admitting: *Deleted

## 2016-06-09 ENCOUNTER — Telehealth: Payer: Self-pay | Admitting: Family Medicine

## 2016-06-09 ENCOUNTER — Ambulatory Visit: Payer: 59 | Admitting: Radiation Oncology

## 2016-06-09 ENCOUNTER — Ambulatory Visit
Admission: RE | Admit: 2016-06-09 | Discharge: 2016-06-09 | Disposition: A | Discharge status: Home / Self Care | Payer: 59 | Source: Ambulatory Visit | Attending: Radiation Oncology | Admitting: Radiation Oncology

## 2016-06-09 ENCOUNTER — Encounter: Payer: Self-pay | Admitting: Radiation Oncology

## 2016-06-09 ENCOUNTER — Other Ambulatory Visit (HOSPITAL_BASED_OUTPATIENT_CLINIC_OR_DEPARTMENT_OTHER): Payer: 59

## 2016-06-09 VITALS — BP 153/79 | HR 69 | Temp 98.3°F | Ht 67.0 in

## 2016-06-09 DIAGNOSIS — C50311 Malignant neoplasm of lower-inner quadrant of right female breast: Secondary | ICD-10-CM | POA: Diagnosis not present

## 2016-06-09 DIAGNOSIS — Z510 Encounter for antineoplastic radiation therapy: Secondary | ICD-10-CM | POA: Diagnosis not present

## 2016-06-09 LAB — COMPREHENSIVE METABOLIC PANEL
ALT: 14 U/L (ref 0–55)
AST: 20 U/L (ref 5–34)
Albumin: 4 g/dL (ref 3.5–5.0)
Alkaline Phosphatase: 78 U/L (ref 40–150)
Anion Gap: 10 mEq/L (ref 3–11)
BUN: 9.2 mg/dL (ref 7.0–26.0)
CO2: 30 mEq/L — ABNORMAL HIGH (ref 22–29)
Calcium: 9.5 mg/dL (ref 8.4–10.4)
Chloride: 104 mEq/L (ref 98–109)
Creatinine: 0.8 mg/dL (ref 0.6–1.1)
EGFR: >90 mL/min/{1.73_m2} (ref >90)
Glucose: 88 mg/dl (ref 70–140)
Potassium: 2.9 mEq/L — CL (ref 3.5–5.1)
Sodium: 145 mEq/L (ref 136–145)
Total Bilirubin: 0.42 mg/dL (ref 0.20–1.20)
Total Protein: 7.4 g/dL (ref 6.4–8.3)

## 2016-06-09 LAB — CBC WITH DIFFERENTIAL/PLATELET
BASO%: 0.2 % (ref 0.0–2.0)
Basophils Absolute: 0 10*3/uL (ref 0.0–0.1)
EOS%: 1.9 % (ref 0.0–7.0)
Eosinophils Absolute: 0.1 10*3/uL (ref 0.0–0.5)
HCT: 37.6 % (ref 34.8–46.6)
HGB: 12.5 g/dL (ref 11.6–15.9)
LYMPH%: 36.1 % (ref 14.0–49.7)
MCH: 29.6 pg (ref 25.1–34.0)
MCHC: 33.2 g/dL (ref 31.5–36.0)
MCV: 89.1 fL (ref 79.5–101.0)
MONO#: 0.2 10*3/uL (ref 0.1–0.9)
MONO%: 3.8 % (ref 0.0–14.0)
NEUT#: 3.3 10*3/uL (ref 1.5–6.5)
NEUT%: 58 % (ref 38.4–76.8)
Platelets: 283 10*3/uL (ref 145–400)
RBC: 4.22 10*6/uL (ref 3.70–5.45)
RDW: 13.3 % (ref 11.2–14.5)
WBC: 5.7 10*3/uL (ref 3.9–10.3)
lymph#: 2.1 10*3/uL (ref 0.9–3.3)

## 2016-06-09 NOTE — Telephone Encounter (Signed)
  Oncology Nurse Navigator Documentation  Navigator Location: [facility] (06/09/16 1500) Navigator Encounter Type: Telephone (06/09/16 1500) Telephone: Outgoing Call (06/09/16 1500)         Patient Visit Type: RadOnc (06/09/16 1500) Treatment Phase: First Radiation Tx (06/09/16 1500)  Left vm to assess needs during xrt. Contact information provided.                          Time Spent with Patient: 15 (06/09/16 1500)

## 2016-06-09 NOTE — Progress Notes (Signed)
Diffuse red rash noted on right chest wall with report of continuous itching.  Skin remains intact.  Seen by Dr. Tammi Klippel and Shona Simpson, PA.  Gien Miconazole powder to apply at least twice daily.  Advised to avoid use of lotions in the treatment filed at this time.  She stated understanding.

## 2016-06-09 NOTE — Progress Notes (Signed)
  Radiation Oncology         [phone]   Name: [name] MRN: [id number]   Date: 06/09/2016  DOB: 1960-12-11   Weekly Radiation Therapy Management    ICD-9-CM ICD-10-CM   1. Breast cancer of lower-inner quadrant of right female breast ([place]) 174.3 C50.311     Current Dose: 10.8 Gy  Planned Dose:  60.4 Gy  Narrative The patient presents for routine under treatment assessment.  Diffuse red rash noted on right chest wall with report of continuous itching. Skin remains intact.   Set-up films were reviewed. The chart was checked.  Physical Findings  height is 5\' 7"  (1.702 m). Her temperature is 98.3 F (36.8 C). Her blood pressure is 153/79 (abnormal) and her pulse is 69. . Weight essentially stable.  No significant changes.  Impression The patient is tolerating radiation.  Possible cutaneous candidiasis  Plan Continue treatment as planned. Given Miconazole powder to apply at least twice daily. Advised to avoid use of lotions in the treatment field at this time. She stated understanding.     Sheral Apley Tammi Klippel, M.D.  This document serves as a record of services personally performed by Tyler Pita, MD. It was created on his behalf by Bethann Humble, a trained medical scribe. The creation of this record is based on the scribe's personal observations and the provider's statements to them. This document has been checked and approved by the attending provider.

## 2016-06-09 NOTE — Telephone Encounter (Signed)
Pt has one touch ultra mini that her insurance company sent her. Pt needs lancets, test strips [facility]

## 2016-06-09 NOTE — Telephone Encounter (Signed)
Received call from lab stating that pt's K+ was 2.9.  Reviewed chart & pt is on lasix & K+ 10 meq -3 tabs bid.  Called pt to verify & she has only been taking K+ 2 in am.  Req she verify with her PCP dose she is supposed to be on & get back on that dose.  She states she has had a lot of issues going on & has just not been compliant but will get back on K+.

## 2016-06-10 ENCOUNTER — Other Ambulatory Visit: Payer: Self-pay | Admitting: Family Medicine

## 2016-06-10 ENCOUNTER — Ambulatory Visit
Admission: RE | Admit: 2016-06-10 | Discharge: 2016-06-10 | Disposition: A | Discharge status: Home / Self Care | Payer: 59 | Source: Ambulatory Visit | Attending: Radiation Oncology | Admitting: Radiation Oncology

## 2016-06-10 DIAGNOSIS — Z510 Encounter for antineoplastic radiation therapy: Secondary | ICD-10-CM | POA: Diagnosis not present

## 2016-06-10 MED ORDER — GLUCOSE BLOOD VI STRP: ORAL_STRIP | 1 refills | Status: DC

## 2016-06-10 MED ORDER — ONETOUCH ULTRASOFT LANCETS MISC: 1 refills | Status: DC

## 2016-06-10 NOTE — Telephone Encounter (Signed)
I sent both scripts e-scribe to [facility].

## 2016-06-11 ENCOUNTER — Ambulatory Visit
Admission: RE | Admit: 2016-06-11 | Discharge: 2016-06-11 | Disposition: A | Discharge status: Home / Self Care | Payer: 59 | Source: Ambulatory Visit | Attending: Radiation Oncology | Admitting: Radiation Oncology

## 2016-06-11 DIAGNOSIS — Z510 Encounter for antineoplastic radiation therapy: Secondary | ICD-10-CM | POA: Diagnosis not present

## 2016-06-12 ENCOUNTER — Ambulatory Visit
Admission: RE | Admit: 2016-06-12 | Discharge: 2016-06-12 | Disposition: A | Discharge status: Home / Self Care | Payer: 59 | Source: Ambulatory Visit | Attending: Radiation Oncology | Admitting: Radiation Oncology

## 2016-06-12 DIAGNOSIS — Z510 Encounter for antineoplastic radiation therapy: Secondary | ICD-10-CM | POA: Diagnosis not present

## 2016-06-13 ENCOUNTER — Ambulatory Visit
Admission: RE | Admit: 2016-06-13 | Discharge: 2016-06-13 | Disposition: A | Discharge status: Home / Self Care | Payer: 59 | Source: Ambulatory Visit | Attending: Radiation Oncology | Admitting: Radiation Oncology

## 2016-06-13 VITALS — BP 141/91 | HR 69 | Temp 98.0°F | Resp 20 | Wt 253.2 lb

## 2016-06-13 DIAGNOSIS — C50311 Malignant neoplasm of lower-inner quadrant of right female breast: Secondary | ICD-10-CM

## 2016-06-13 DIAGNOSIS — Z510 Encounter for antineoplastic radiation therapy: Secondary | ICD-10-CM | POA: Diagnosis not present

## 2016-06-13 NOTE — Progress Notes (Signed)
Department of Radiation Oncology  Phone:  [phone] Fax:        [phone]  Weekly Treatment Note    Name: [name] Date: 06/13/2016 MRN: [id number] DOB: 09/13/1961   Diagnosis:  No diagnosis found.   Current dose: 18 Gy  Current fraction:10   MEDICATIONS: Current Outpatient Prescriptions  Medication Sig Dispense Refill  . amitriptyline (ELAVIL) 25 MG tablet Take 25 mg by mouth at bedtime.     Marland Kitchen amLODipine (NORVASC) 5 MG tablet TAKE 1 TABLET(5 MG) BY MOUTH TWICE DAILY 180 tablet 3  . Diclofenac Sodium 3 % GEL Place 4 g onto the skin 4 (four) times daily. 50 g 0  . furosemide (LASIX) 20 MG tablet Take 1 tablet (20 mg total) by mouth daily. 90 tablet 3  . gabapentin (NEURONTIN) 300 MG capsule Take 1 capsule (300 mg total) by mouth at bedtime. 90 capsule 4  . glucose blood (ONE TOUCH TEST STRIPS) test strip Dispense Ultra Mini, test once per day and code is E 11.9 100 each 1  . hyaluronate sodium (RADIAPLEXRX) GEL Apply 1 application topically 2 (two) times daily. Apply to left breast after rad txs daily and at bedtime    . Lancets (ONETOUCH ULTRASOFT) lancets Test once per day and diagnosis code is E 11.9 100 each 1  . lisinopril (PRINIVIL,ZESTRIL) 10 MG tablet TAKE 1 TABLET(10 MG) BY MOUTH DAILY 90 tablet 0  . metFORMIN (GLUCOPHAGE) 500 MG tablet TAKE 1 TABLET(500 MG) BY MOUTH TWICE DAILY WITH A MEAL 180 tablet 0  . methadone (DOLOPHINE) 10 MG tablet Take 10 mg by mouth every 8 (eight) hours as needed for moderate pain.     . metoprolol (LOPRESSOR) 50 MG tablet Take 100 mg by mouth 2 (two) times daily.    . non-metallic deodorant Jethro Poling) MISC Apply 1 application topically daily.    Marland Kitchen omeprazole (PRILOSEC) 40 MG capsule TAKE 1 CAPSULE(40 MG) BY MOUTH DAILY 90 capsule 1  . oxyCODONE (ROXICODONE) 15 MG immediate release tablet Take 15 mg by mouth every 6 (six) hours as needed for pain. Reported on 12/10/2015    . potassium chloride (K-DUR) 10 MEQ tablet Take 3 tablets  (30 mEq total) by mouth 2 (two) times daily. 180 tablet 11  . temazepam (RESTORIL) 30 MG capsule TAKE 1 CAPSULE BY MOUTH EVERY DAY AT BEDTIME AS NEEDED (Patient taking differently: TAKE 1 CAPSULE BY MOUTH EVERY DAY AT BEDTIME AS NEEDED PRN SLEEP) 90 capsule 1   No current facility-administered medications for this encounter.      ALLERGIES: Codeine; Latex; and Sulfonamide derivatives   LABORATORY DATA:  Lab Results  Component Value Date   WBC 5.7 06/09/2016   HGB 12.5 06/09/2016   HCT 37.6 06/09/2016   MCV 89.1 06/09/2016   PLT 283 06/09/2016   Lab Results  Component Value Date   NA 145 06/09/2016   K 2.9 (LL) 06/09/2016   CL 101 04/28/2016   CO2 30 (H) 06/09/2016   Lab Results  Component Value Date   ALT 14 06/09/2016   AST 20 06/09/2016   ALKPHOS 78 06/09/2016   BILITOT 0.42 06/09/2016     NARRATIVE: [name] was seen today for weekly treatment management. The chart was checked and the patient's films were reviewed.  Skin looks well healed. She was using Miconazole powder. She will restart using Radiaplex cream this weekend and watch for the rash. If the rash reoccurs, she will stop Radiaplex and let Joaquim Lai, RN  know. She notes a good appetite. She denies pain in right chest wall/breast area. She has generalized pain in her back and knees. She goes to pain management for that problem. She states fatigue.  PHYSICAL EXAMINATION: weight is 253 lb 3.2 oz (114.9 kg). Her oral temperature is 98 F (36.7 C). Her blood pressure is 141/91 (abnormal) and her pulse is 69. Her respiration is 20.        Mild hyperpigmentation  ASSESSMENT: The patient is doing satisfactorily with treatment.  PLAN: We will continue with the patient's radiation treatment as planned.        This document serves as a record of services personally performed by Kyung Rudd, MD. It was created on his behalf by Bethann Humble, a trained medical scribe. The creation of this record is based  on the scribe's personal observations and the provider's statements to them. This document has been checked and approved by the attending provider.

## 2016-06-13 NOTE — Progress Notes (Addendum)
Weekly rad txs right chest wall/breast , skin looks well healed, was using miconazole powder, not radiaplex, has cream  Will restart this weekend and watch for rash, if rash occurs again, will stop radiaplex and let Joaquim Lai RN know, appetite good, no pain in right chest wall/breast area, has generalized pain back,knees, goes to pain management for that,energy level  vert tired 11:01 AM   BP (!) 141/91 (BP Location: Left Arm, Patient Position: Sitting, Cuff Size: Large)   Pulse 69   Temp 98 F (36.7 C) (Oral)   Resp 20   Wt 253 lb 3.2 oz (114.9 kg)   BMI 39.66 kg/m   Wt Readings from Last 3 Encounters:  06/13/16 253 lb 3.2 oz (114.9 kg)  06/06/16 252 lb 1.6 oz (114.4 kg)  05/30/16 256 lb 3.2 oz (116.2 kg)

## 2016-06-16 ENCOUNTER — Ambulatory Visit: Payer: 59 | Admitting: Oncology

## 2016-06-16 ENCOUNTER — Other Ambulatory Visit: Payer: 59

## 2016-06-16 ENCOUNTER — Ambulatory Visit
Admission: RE | Admit: 2016-06-16 | Discharge: 2016-06-16 | Disposition: A | Discharge status: Home / Self Care | Payer: 59 | Source: Ambulatory Visit | Attending: Radiation Oncology | Admitting: Radiation Oncology

## 2016-06-16 DIAGNOSIS — Z510 Encounter for antineoplastic radiation therapy: Secondary | ICD-10-CM | POA: Diagnosis not present

## 2016-06-17 ENCOUNTER — Ambulatory Visit
Admission: RE | Admit: 2016-06-17 | Discharge: 2016-06-17 | Disposition: A | Discharge status: Home / Self Care | Payer: 59 | Source: Ambulatory Visit | Attending: Radiation Oncology | Admitting: Radiation Oncology

## 2016-06-17 DIAGNOSIS — Z510 Encounter for antineoplastic radiation therapy: Secondary | ICD-10-CM | POA: Diagnosis not present

## 2016-06-18 ENCOUNTER — Ambulatory Visit
Admission: RE | Admit: 2016-06-18 | Discharge: 2016-06-18 | Disposition: A | Discharge status: Home / Self Care | Payer: 59 | Source: Ambulatory Visit | Attending: Radiation Oncology | Admitting: Radiation Oncology

## 2016-06-18 DIAGNOSIS — Z510 Encounter for antineoplastic radiation therapy: Secondary | ICD-10-CM | POA: Diagnosis not present

## 2016-06-19 ENCOUNTER — Ambulatory Visit
Admission: RE | Admit: 2016-06-19 | Discharge: 2016-06-19 | Disposition: A | Discharge status: Home / Self Care | Payer: 59 | Source: Ambulatory Visit | Attending: Radiation Oncology | Admitting: Radiation Oncology

## 2016-06-19 DIAGNOSIS — Z510 Encounter for antineoplastic radiation therapy: Secondary | ICD-10-CM | POA: Diagnosis not present

## 2016-06-20 ENCOUNTER — Encounter: Payer: Self-pay | Admitting: Radiation Oncology

## 2016-06-20 ENCOUNTER — Ambulatory Visit
Admission: RE | Admit: 2016-06-20 | Discharge: 2016-06-20 | Disposition: A | Discharge status: Home / Self Care | Payer: 59 | Source: Ambulatory Visit | Attending: Radiation Oncology | Admitting: Radiation Oncology

## 2016-06-20 VITALS — BP 144/93 | HR 73 | Resp 16 | Wt 253.7 lb

## 2016-06-20 DIAGNOSIS — Z510 Encounter for antineoplastic radiation therapy: Secondary | ICD-10-CM | POA: Diagnosis not present

## 2016-06-20 DIAGNOSIS — C50311 Malignant neoplasm of lower-inner quadrant of right female breast: Secondary | ICD-10-CM

## 2016-06-20 NOTE — Progress Notes (Signed)
Weight and vitals stable. Denies pain. Reports new onset of nausea x 2 days. Faint hyperpigmentation of right chest wall without desquamation noted. Reports using radiaplex as directed. Reports moderate fatigue.   BP (!) 144/93 (BP Location: Left Arm, Patient Position: Sitting, Cuff Size: Large)   Pulse 73   Resp 16   Wt 253 lb 11.2 oz (115.1 kg)   SpO2 100%   BMI 39.74 kg/m  Wt Readings from Last 3 Encounters:  06/20/16 253 lb 11.2 oz (115.1 kg)  06/13/16 253 lb 3.2 oz (114.9 kg)  06/06/16 252 lb 1.6 oz (114.4 kg)

## 2016-06-20 NOTE — Progress Notes (Signed)
  Radiation Oncology         [phone]   Name: [name] MRN: [id number]   Date: 06/20/2016  DOB: 1961-07-23   Weekly Radiation Therapy Management    ICD-9-CM ICD-10-CM   1. Breast cancer of lower-inner quadrant of right female breast ([place]) 174.3 C50.311     Current Dose:  27 Gy  Planned Dose:  60.4 Gy  Narrative The patient presents for routine under treatment assessment.  Denies pain. Reports new onset of nausea x 2 days. Faint hyperpigmentation of right chest wall without desquamation noted. Reports using radiaplex as directed. Reports moderate fatigue. She is using Miconazole powder as directed.  Set-up films were reviewed. The chart was checked.  Physical Findings  weight is 253 lb 11.2 oz (115.1 kg). Her blood pressure is 144/93 (abnormal) and her pulse is 73. Her respiration is 16 and oxygen saturation is 100%. . Weight essentially stable.  Mild erythema with improvement in candidiasis.  Impression The patient is tolerating radiation.  Possible cutaneous candidiasis.  Plan Continue treatment as planned. Continue Miconazole powder.     Sheral Apley Tammi Klippel, M.D.  This document serves as a record of services personally performed by Tyler Pita, MD. It was created on his behalf by Darcus Austin, a trained medical scribe. The creation of this record is based on the scribe's personal observations and the provider's statements to them. This document has been checked and approved by the attending provider.

## 2016-06-23 ENCOUNTER — Ambulatory Visit
Admission: RE | Admit: 2016-06-23 | Discharge: 2016-06-23 | Disposition: A | Discharge status: Home / Self Care | Payer: 59 | Source: Ambulatory Visit | Attending: Radiation Oncology | Admitting: Radiation Oncology

## 2016-06-23 DIAGNOSIS — Z510 Encounter for antineoplastic radiation therapy: Secondary | ICD-10-CM | POA: Diagnosis not present

## 2016-06-24 ENCOUNTER — Ambulatory Visit
Admission: RE | Admit: 2016-06-24 | Discharge: 2016-06-24 | Disposition: A | Discharge status: Home / Self Care | Payer: 59 | Source: Ambulatory Visit | Attending: Radiation Oncology | Admitting: Radiation Oncology

## 2016-06-24 DIAGNOSIS — Z510 Encounter for antineoplastic radiation therapy: Secondary | ICD-10-CM | POA: Diagnosis not present

## 2016-06-25 ENCOUNTER — Ambulatory Visit
Admission: RE | Admit: 2016-06-25 | Discharge: 2016-06-25 | Disposition: A | Discharge status: Home / Self Care | Payer: 59 | Source: Ambulatory Visit | Attending: Radiation Oncology | Admitting: Radiation Oncology

## 2016-06-25 ENCOUNTER — Other Ambulatory Visit: Payer: Self-pay | Admitting: Family Medicine

## 2016-06-25 DIAGNOSIS — Z510 Encounter for antineoplastic radiation therapy: Secondary | ICD-10-CM | POA: Diagnosis not present

## 2016-06-25 NOTE — Telephone Encounter (Signed)
Can we refill these? 

## 2016-06-26 ENCOUNTER — Ambulatory Visit
Admission: RE | Admit: 2016-06-26 | Discharge: 2016-06-26 | Disposition: A | Discharge status: Home / Self Care | Payer: 59 | Source: Ambulatory Visit | Attending: Radiation Oncology | Admitting: Radiation Oncology

## 2016-06-26 VITALS — BP 151/88 | HR 68 | Temp 98.2°F | Resp 18 | Ht 67.0 in | Wt 251.8 lb

## 2016-06-26 DIAGNOSIS — C50311 Malignant neoplasm of lower-inner quadrant of right female breast: Secondary | ICD-10-CM

## 2016-06-26 DIAGNOSIS — Z510 Encounter for antineoplastic radiation therapy: Secondary | ICD-10-CM | POA: Diagnosis not present

## 2016-06-26 NOTE — Progress Notes (Signed)
  Radiation Oncology         [phone]   Name: [name] MRN: [id number]   Date: 06/26/2016  DOB: [birth year]   Weekly Radiation Therapy Management    ICD-9-CM ICD-10-CM   1. Malignant neoplasm of lower-inner quadrant of right female breast, unspecified estrogen receptor status ([facility]) 174.3 C50.311     Current Dose:  34.2 Gy  Planned Dose:  60.4 Gy  Narrative The patient presents for routine under treatment assessment.  Weight and vitals stable. Denies pain. Reports nausea over the past week. Nursing notes, faint hyperpigmentation of right chest wall without desquamation, having some burning to CW and axilla. Reports using radiaplex as directed. Reports moderate fatigue in the afternoon.  Set-up films were reviewed. The chart was checked.  Physical Findings  height is 5\' 7"  (1.702 m) and weight is 251 lb 12.8 oz (114.2 kg). Her oral temperature is 98.2 F (36.8 C). Her blood pressure is 151/88 (abnormal) and her pulse is 68. Her respiration is 18 and oxygen saturation is 97%. . Weight essentially stable. Mild erythema with no desquamation within the treatment field.  Impression The patient is tolerating radiation.    Plan Continue treatment as planned.      Sheral Apley Tammi Klippel, M.D.  This document serves as a record of services personally performed by Tyler Pita, MD. It was created on his behalf by Arlyce Harman, a trained medical scribe. The creation of this record is based on the scribe's personal observations and the provider's statements to them. This document has been checked and approved by the attending provider.

## 2016-06-26 NOTE — Progress Notes (Signed)
Weight and vitals stable. Denies pain. Reports nof nausea  over the past week. Faint hyperpigmentation of right chest wall without desquamation noted, having some burning to CW and axilla. Reports using radiaplex as directed. Reports moderate fatigue in the afternoon. Wt Readings from Last 3 Encounters:  06/26/16 251 lb 12.8 oz (114.2 kg)  06/20/16 253 lb 11.2 oz (115.1 kg)  06/13/16 253 lb 3.2 oz (114.9 kg)  BP (!) 151/88 (BP Location: Left Arm, Patient Position: Sitting, Cuff Size: Large)   Pulse 68   Temp 98.2 F (36.8 C) (Oral)   Resp 18   Ht 5\' 7"  (1.702 m)   Wt 251 lb 12.8 oz (114.2 kg)   SpO2 97%   BMI 39.44 kg/m

## 2016-06-27 ENCOUNTER — Ambulatory Visit: Payer: 59

## 2016-06-30 ENCOUNTER — Ambulatory Visit: Payer: 59 | Admitting: Radiation Oncology

## 2016-06-30 ENCOUNTER — Ambulatory Visit
Admission: RE | Admit: 2016-06-30 | Discharge: 2016-06-30 | Disposition: A | Discharge status: Home / Self Care | Payer: 59 | Source: Ambulatory Visit | Attending: Radiation Oncology | Admitting: Radiation Oncology

## 2016-06-30 DIAGNOSIS — C50311 Malignant neoplasm of lower-inner quadrant of right female breast: Secondary | ICD-10-CM | POA: Diagnosis not present

## 2016-07-01 ENCOUNTER — Ambulatory Visit
Admission: RE | Admit: 2016-07-01 | Discharge: 2016-07-01 | Disposition: A | Discharge status: Home / Self Care | Payer: 59 | Source: Ambulatory Visit | Attending: Radiation Oncology | Admitting: Radiation Oncology

## 2016-07-01 DIAGNOSIS — C50311 Malignant neoplasm of lower-inner quadrant of right female breast: Secondary | ICD-10-CM | POA: Diagnosis not present

## 2016-07-02 ENCOUNTER — Ambulatory Visit
Admission: RE | Admit: 2016-07-02 | Discharge: 2016-07-02 | Disposition: A | Discharge status: Home / Self Care | Payer: 59 | Source: Ambulatory Visit | Attending: Radiation Oncology | Admitting: Radiation Oncology

## 2016-07-02 ENCOUNTER — Other Ambulatory Visit: Payer: Self-pay | Admitting: Family Medicine

## 2016-07-02 DIAGNOSIS — C50311 Malignant neoplasm of lower-inner quadrant of right female breast: Secondary | ICD-10-CM | POA: Diagnosis not present

## 2016-07-03 ENCOUNTER — Ambulatory Visit
Admission: RE | Admit: 2016-07-03 | Discharge: 2016-07-03 | Disposition: A | Discharge status: Home / Self Care | Payer: 59 | Source: Ambulatory Visit | Attending: Radiation Oncology | Admitting: Radiation Oncology

## 2016-07-03 VITALS — BP 142/73 | HR 72 | Resp 16 | Wt 252.2 lb

## 2016-07-03 DIAGNOSIS — C50311 Malignant neoplasm of lower-inner quadrant of right female breast: Secondary | ICD-10-CM | POA: Diagnosis not present

## 2016-07-03 NOTE — Progress Notes (Signed)
  Radiation Oncology         [phone]   Name: [name] MRN: [id number]   Date: 07/03/2016  DOB: Jan 09, 1961   Weekly Radiation Therapy Management    ICD-9-CM ICD-10-CM   1. Malignant neoplasm of lower-inner quadrant of right female breast, unspecified estrogen receptor status ([facility]) 174.3 C50.311     Current Dose:  41.4 Gy  Planned Dose:  60.4 Gy  Narrative The patient presents for routine under treatment assessment.  Weight and vitals stable. Denies pain. Hyperpigmentation without desquamation of right chest wall noted. Provided patient with sonafine cream to use in place of radiaplex. Patient reports discomfort/stinging of treatment skin when she applies radiaplex. No evidence of lymphedema noted in the right arm. Full ROM of right arm demonstrated. Patient reports fatigue which she manages with rest breaks.  Set-up films were reviewed. The chart was checked.  Physical Findings  weight is 252 lb 3.2 oz (114.4 kg). Her blood pressure is 142/73 (abnormal) and her pulse is 72. Her respiration is 16 and oxygen saturation is 100%. . Weight essentially stable. Mild erythema with no desquamation within the treatment field.  Impression The patient is tolerating radiation.    Plan Continue treatment as planned.      Sheral Apley Tammi Klippel, M.D.  This document serves as a record of services personally performed by Tyler Pita, MD. It was created on his behalf by Maryla Morrow, a trained medical scribe. The creation of this record is based on the scribe's personal observations and the provider's statements to them. This document has been checked and approved by the attending provider.

## 2016-07-03 NOTE — Progress Notes (Signed)
Weight and vitals stable. Denies pain. Hyperpigmentation without desquamation of right chest wall noted. Provided patient with sonafine cream to used in place of radiaplex. Patient reports discomfort/stinging of treatment skin when she applies radiaplex. No evidence of lymphedema noted in the right arm. Full ROM of right arm demonstrated. Patient reports fatigue which she manages with rest breaks.   BP (!) 142/73 (BP Location: Left Arm, Patient Position: Sitting, Cuff Size: Large)   Pulse 72   Resp 16   Wt 252 lb 3.2 oz (114.4 kg)   SpO2 100%   BMI 39.50 kg/m  Wt Readings from Last 3 Encounters:  07/03/16 252 lb 3.2 oz (114.4 kg)  06/26/16 251 lb 12.8 oz (114.2 kg)  06/20/16 253 lb 11.2 oz (115.1 kg)

## 2016-07-04 ENCOUNTER — Ambulatory Visit
Admission: RE | Admit: 2016-07-04 | Discharge: 2016-07-04 | Disposition: A | Discharge status: Home / Self Care | Payer: 59 | Source: Ambulatory Visit | Attending: Radiation Oncology | Admitting: Radiation Oncology

## 2016-07-04 DIAGNOSIS — C50311 Malignant neoplasm of lower-inner quadrant of right female breast: Secondary | ICD-10-CM | POA: Diagnosis not present

## 2016-07-04 MED ORDER — SONAFINE EX EMUL
1.0000 "application " | Freq: Two times a day (BID) | CUTANEOUS | Status: DC
  Administered 2016-07-04: 1 via TOPICAL

## 2016-07-07 ENCOUNTER — Ambulatory Visit: Payer: 59 | Admitting: Radiation Oncology

## 2016-07-07 ENCOUNTER — Ambulatory Visit
Admission: RE | Admit: 2016-07-07 | Discharge: 2016-07-07 | Disposition: A | Discharge status: Home / Self Care | Payer: 59 | Source: Ambulatory Visit | Attending: Radiation Oncology | Admitting: Radiation Oncology

## 2016-07-07 DIAGNOSIS — C50311 Malignant neoplasm of lower-inner quadrant of right female breast: Secondary | ICD-10-CM | POA: Diagnosis not present

## 2016-07-08 ENCOUNTER — Ambulatory Visit
Admission: RE | Admit: 2016-07-08 | Discharge: 2016-07-08 | Disposition: A | Discharge status: Home / Self Care | Payer: 59 | Source: Ambulatory Visit | Attending: Radiation Oncology | Admitting: Radiation Oncology

## 2016-07-08 DIAGNOSIS — C50311 Malignant neoplasm of lower-inner quadrant of right female breast: Secondary | ICD-10-CM | POA: Diagnosis not present

## 2016-07-09 ENCOUNTER — Ambulatory Visit: Payer: 59

## 2016-07-09 ENCOUNTER — Ambulatory Visit
Admission: RE | Admit: 2016-07-09 | Discharge: 2016-07-09 | Disposition: A | Discharge status: Home / Self Care | Payer: 59 | Source: Ambulatory Visit | Attending: Radiation Oncology | Admitting: Radiation Oncology

## 2016-07-09 DIAGNOSIS — C50311 Malignant neoplasm of lower-inner quadrant of right female breast: Secondary | ICD-10-CM | POA: Diagnosis not present

## 2016-07-10 ENCOUNTER — Ambulatory Visit: Payer: 59

## 2016-07-10 ENCOUNTER — Inpatient Hospital Stay
Admission: RE | Admit: 2016-07-10 | Discharge: 2016-07-10 | Disposition: A | Discharge status: Home / Self Care | Payer: Self-pay | Source: Ambulatory Visit | Attending: Radiation Oncology | Admitting: Radiation Oncology

## 2016-07-10 ENCOUNTER — Ambulatory Visit
Admission: RE | Admit: 2016-07-10 | Discharge: 2016-07-10 | Disposition: A | Discharge status: Home / Self Care | Payer: 59 | Source: Ambulatory Visit | Attending: Radiation Oncology | Admitting: Radiation Oncology

## 2016-07-10 ENCOUNTER — Ambulatory Visit: Payer: 59 | Admitting: Radiation Oncology

## 2016-07-10 DIAGNOSIS — C50311 Malignant neoplasm of lower-inner quadrant of right female breast: Secondary | ICD-10-CM | POA: Diagnosis not present

## 2016-07-11 ENCOUNTER — Ambulatory Visit: Payer: 59

## 2016-07-11 ENCOUNTER — Ambulatory Visit
Admission: RE | Admit: 2016-07-11 | Discharge: 2016-07-11 | Disposition: A | Discharge status: Home / Self Care | Payer: 59 | Source: Ambulatory Visit | Attending: Radiation Oncology | Admitting: Radiation Oncology

## 2016-07-11 ENCOUNTER — Ambulatory Visit: Payer: 59 | Admitting: Oncology

## 2016-07-11 DIAGNOSIS — C50311 Malignant neoplasm of lower-inner quadrant of right female breast: Secondary | ICD-10-CM | POA: Diagnosis not present

## 2016-07-12 ENCOUNTER — Ambulatory Visit: Payer: 59

## 2016-07-14 ENCOUNTER — Ambulatory Visit
Admission: RE | Admit: 2016-07-14 | Discharge: 2016-07-14 | Disposition: A | Discharge status: Home / Self Care | Payer: 59 | Source: Ambulatory Visit | Attending: Radiation Oncology | Admitting: Radiation Oncology

## 2016-07-14 ENCOUNTER — Encounter: Payer: Self-pay | Admitting: Radiation Oncology

## 2016-07-14 VITALS — BP 142/86 | HR 75 | Temp 98.2°F | Resp 18 | Ht 67.0 in | Wt 251.8 lb

## 2016-07-14 DIAGNOSIS — C50311 Malignant neoplasm of lower-inner quadrant of right female breast: Secondary | ICD-10-CM | POA: Diagnosis not present

## 2016-07-14 NOTE — Progress Notes (Signed)
  Radiation Oncology         [phone]   Name: [name] MRN: [id number]   Date: 07/14/2016  DOB: Jan 22, 1961   Weekly Radiation Therapy Management    ICD-9-CM ICD-10-CM   1. Malignant neoplasm of lower-inner quadrant of right female breast, unspecified estrogen receptor status ([facility]) 174.3 C50.311     Current Dose:  54.4 Gy  Planned Dose:  60.4 Gy  Narrative The patient presents for routine under treatment assessment.  Weight and vitals stable. Reports pain to right chest wall, 6/10 in severity, and is taking Oxycodone. Nursing provided patient with some sonafine cream to be use din place of the radiaplex. Patient reports discomfort/stinging of treatment skin when she applies radiaplex. No evidence of lymphedema noted in the right arm. Full range of motion of right arm demonstrated, per nursing. Patient reports fatigue which she manages with rest breaks.  Set-up films were reviewed. The chart was checked.  Physical Findings  height is 5\' 7"  (1.702 m) and weight is 251 lb 12.8 oz (114.2 kg). Her oral temperature is 98.2 F (36.8 C). Her blood pressure is 142/86 (abnormal) and her pulse is 75. Her respiration is 18 and oxygen saturation is 96%. . Weight essentially stable. Hyperpigmentation right upper chest with dry desquamation of the right chest wall and axilla with peeling.  Impression The patient is tolerating radiation.  Plan Continue treatment as planned.      Sheral Apley Tammi Klippel, M.D.  This document serves as a record of services personally performed by Tyler Pita, MD. It was created on his behalf by Maryla Morrow, a trained medical scribe. The creation of this record is based on the scribe's personal observations and the provider's statements to them. This document has been checked and approved by the attending provider.

## 2016-07-14 NOTE — Progress Notes (Addendum)
Weight and vitals stable. Reports pain to RCW 6/10 taking Oxycodone . Hyperpigmentation right upper with dry desquamation of right chest wall and axilla with peeling. Provided patient with sonafine cream to used in place of radiaplex. Patient reports discomfort/stinging of treatment skin when she applies radiaplex. No evidence of lymphedema noted in the right arm. Full ROM of right arm demonstrated. Patient reports fatigue which she manages with rest breaks.  EOT one month follow up card given with instructions, see education area for documentation. Wt Readings from Last 3 Encounters:  07/14/16 251 lb 12.8 oz (114.2 kg)  07/03/16 252 lb 3.2 oz (114.4 kg)  06/26/16 251 lb 12.8 oz (114.2 kg)  BP (!) 142/86 (BP Location: Left Arm, Patient Position: Sitting, Cuff Size: Large)   Pulse 75   Temp 98.2 F (36.8 C) (Oral)   Resp 18   Ht 5\' 7"  (1.702 m)   Wt 251 lb 12.8 oz (114.2 kg)   SpO2 96%   BMI 39.44 kg/m

## 2016-07-15 ENCOUNTER — Ambulatory Visit
Admission: RE | Admit: 2016-07-15 | Discharge: 2016-07-15 | Disposition: A | Discharge status: Home / Self Care | Payer: 59 | Source: Ambulatory Visit | Attending: Radiation Oncology | Admitting: Radiation Oncology

## 2016-07-15 DIAGNOSIS — C50311 Malignant neoplasm of lower-inner quadrant of right female breast: Secondary | ICD-10-CM | POA: Diagnosis not present

## 2016-07-15 NOTE — Progress Notes (Signed)
Received patient in the clinic following radiation treatment. Patient reports great discomfort under right axilla related to moist desquamation. Painted area of moist desquamation only with gentain violet. Patient tolerated this well.

## 2016-07-16 ENCOUNTER — Ambulatory Visit
Admission: RE | Admit: 2016-07-16 | Discharge: 2016-07-16 | Disposition: A | Discharge status: Home / Self Care | Payer: 59 | Source: Ambulatory Visit | Attending: Radiation Oncology | Admitting: Radiation Oncology

## 2016-07-16 ENCOUNTER — Encounter: Payer: Self-pay | Admitting: Family Medicine

## 2016-07-16 ENCOUNTER — Ambulatory Visit (INDEPENDENT_AMBULATORY_CARE_PROVIDER_SITE_OTHER): Payer: 59 | Admitting: Family Medicine

## 2016-07-16 ENCOUNTER — Ambulatory Visit: Payer: 59

## 2016-07-16 VITALS — BP 150/86 | Temp 98.3°F | Ht 67.0 in | Wt 257.0 lb

## 2016-07-16 DIAGNOSIS — E119 Type 2 diabetes mellitus without complications: Secondary | ICD-10-CM

## 2016-07-16 DIAGNOSIS — Z23 Encounter for immunization: Secondary | ICD-10-CM | POA: Diagnosis not present

## 2016-07-16 DIAGNOSIS — C50311 Malignant neoplasm of lower-inner quadrant of right female breast: Secondary | ICD-10-CM | POA: Diagnosis not present

## 2016-07-16 DIAGNOSIS — G4700 Insomnia, unspecified: Secondary | ICD-10-CM

## 2016-07-16 DIAGNOSIS — I10 Essential (primary) hypertension: Secondary | ICD-10-CM | POA: Diagnosis not present

## 2016-07-16 LAB — HEMOGLOBIN A1C: Hgb A1c MFr Bld: 6.3 % (ref 4.6–6.5)

## 2016-07-16 MED ORDER — ZOLPIDEM TARTRATE 10 MG PO TABS: 10.0000 mg | ORAL_TABLET | Freq: Every evening | ORAL | 5 refills | Status: DC | PRN

## 2016-07-16 NOTE — Progress Notes (Signed)
   Subjective:    Patient ID: [name], female    DOB: [birth year], 55 y.o.   MRN: [id number]  HPI Here to follow up. She is doing well. Her last round of radiation therapy is tomorrow. She will start on Tamoxifen next week. Her am fasting glucoses run 110-130. BP is stable. She has trouble sleeping however and Temazepam no longer works as well as it used to.    Review of Systems  Constitutional: Negative.   Respiratory: Negative.   Cardiovascular: Negative.   Gastrointestinal: Negative.   Neurological: Negative.        Objective:   Physical Exam  Constitutional: She is oriented to person, place, and time. She appears well-developed and well-nourished.  Cardiovascular: Normal rate, regular rhythm, normal heart sounds and intact distal pulses.   Pulmonary/Chest: Effort normal and breath sounds normal.  Musculoskeletal: She exhibits no edema.  Neurological: She is alert and oriented to person, place, and time.          Assessment & Plan:  Her HTN is stable. The DM seems to be stable but we will get an A1c today. Her breast cancer treatments are almost over. For sleep try Ambien.  Laurey Morale, MD

## 2016-07-16 NOTE — Progress Notes (Signed)
Pre visit review using our clinic review tool, if applicable. No additional management support is needed unless otherwise documented below in the visit note. 

## 2016-07-17 ENCOUNTER — Encounter: Payer: Self-pay | Admitting: Radiation Oncology

## 2016-07-17 ENCOUNTER — Ambulatory Visit
Admission: RE | Admit: 2016-07-17 | Discharge: 2016-07-17 | Disposition: A | Discharge status: Home / Self Care | Payer: 59 | Source: Ambulatory Visit | Attending: Radiation Oncology | Admitting: Radiation Oncology

## 2016-07-17 ENCOUNTER — Ambulatory Visit: Payer: 59

## 2016-07-17 VITALS — BP 136/85 | HR 72 | Temp 98.5°F | Resp 18 | Ht 67.0 in | Wt 257.6 lb

## 2016-07-17 DIAGNOSIS — Z170 Estrogen receptor positive status [ER+]: Principal | ICD-10-CM

## 2016-07-17 DIAGNOSIS — C50311 Malignant neoplasm of lower-inner quadrant of right female breast: Secondary | ICD-10-CM | POA: Diagnosis not present

## 2016-07-17 NOTE — Progress Notes (Signed)
  Radiation Oncology         [phone]   Name: [name] MRN: [id number]   Date: 07/17/2016  DOB: [birth year]   Weekly Radiation Therapy Management    ICD-9-CM ICD-10-CM   1. Malignant neoplasm of lower-inner quadrant of right breast of female, estrogen receptor positive ([place]) 174.3 C50.311 Amb Referral to Survivorship Program   V86.0 Z17.0     Current Dose:  60.4 Gy  Planned Dose:  60.4 Gy  Narrative The patient presents for routine under treatment assessment.  She is happy to complete her treatment  Weight and vitals stable. Denies pain. Per nursing, hyperpigmentation of the right chest wall noted with multiple small areas of both dry and moist desquamation. Reports fatigue. Nursing encouraged patient to continue use of sonafine and neosporin until open areas are closed and then transition to a lotion with Vitamin E and cocoa butter.  Set-up films were reviewed. The chart was checked.  Physical Findings  height is 5\' 7"  (1.702 m) and weight is 257 lb 9.6 oz (116.8 kg). Her oral temperature is 98.5 F (36.9 C). Her blood pressure is 136/85 and her pulse is 72. Her respiration is 18 and oxygen saturation is 99%. .  Painted moist areas of desquamation with gentian violet.  Impression The patient is tolerating radiation.  Plan Today was the final treatment. We will follow up in 1 week.    -----------------------------------  Blair Promise, PhD, MD   This document serves as a record of services personally performed by Gery Pray, MD and Shona Simpson, PA. It was created on his behalf by Maryla Morrow, a trained medical scribe. The creation of this record is based on the scribe's personal observations and the provider's statements to them. This document has been checked and approved by the attending provider.

## 2016-07-17 NOTE — Progress Notes (Signed)
Weight and vitals stable. Denies pain. Hyperpigmentation of right chest wall noted with multiple small areas of both dry and moist desquamation. Reports fatigue. Provided patient with and reviewed  educational material about [facility], Survivorship, [facility] and [facility]. Encouraged patient to continue use of sonafine and neosporin until open areas are closed then, transition to a lotion with vitamin e and coco butter. Provided patient with a one week follow up for skin check and a standard one month follow up. Encouraged patient to contact this RN with future needs. Patient verbalized understanding.  BP 136/85   Pulse 72   Temp 98.5 F (36.9 C) (Oral)   Resp 18   Ht 5\' 7"  (1.702 m)   Wt 257 lb 9.6 oz (116.8 kg)   SpO2 99%   BMI 40.35 kg/m  Wt Readings from Last 3 Encounters:  07/17/16 257 lb 9.6 oz (116.8 kg)  07/16/16 257 lb (116.6 kg)  07/14/16 251 lb 12.8 oz (114.2 kg)

## 2016-07-17 NOTE — Progress Notes (Signed)
Painted moist areas of desquamation with gentian violet as ordered by Dr. Sondra Come. Patient tolerated this well.

## 2016-07-18 ENCOUNTER — Encounter: Payer: Self-pay | Admitting: *Deleted

## 2016-07-20 NOTE — Progress Notes (Signed)
  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  The right chest wall and supraclavicular region was treated to 50.4 Gy in 28 fractions of 1.8 Gy 2.  The mastectomy site was boosted to 60.4 Gy with 5 fractions of 2 Gy  Beams/energy:    1.  The right chest wall and supraclavicular region was treated with a 4-field technique with 6 and 10 MV X-rays 2.  The mastectomy site was boosted using 6 MeV electrons  Narrative: The patient tolerated radiation treatment relatively well.   hyperpigmentation of the right chest wall noted with multiple small areas of both dry and moist desquamation. Reports fatigue. Nursing encouraged patient to continue use of sonafine and neosporin   Plan: The patient has completed radiation treatment. The patient will return to radiation oncology clinic for routine followup in one month. I advised her to call or return sooner if she has any questions or concerns related to her recovery or treatment. ________________________________  Sheral Apley. Tammi Klippel, M.D.

## 2016-07-22 ENCOUNTER — Ambulatory Visit
Admission: RE | Admit: 2016-07-22 | Discharge: 2016-07-22 | Disposition: A | Discharge status: Home / Self Care | Payer: 59 | Source: Ambulatory Visit | Attending: Radiation Oncology | Admitting: Radiation Oncology

## 2016-07-22 ENCOUNTER — Encounter: Payer: Self-pay | Admitting: Radiation Oncology

## 2016-07-22 NOTE — Progress Notes (Signed)
Patient returns today for skin check. Right chest wall with hyperpigmentation and moist desquamation. Previously open skin at neck has healed and closed. Previously open skin at right axilla has healed and closed. Unfortunately, there are new open areas of moist desquamation at surgical sites on right chest wall. Reports using sonafine and neosporin as directed. Reports right chest wall pain 6 on a scale of 0-10 for which she is taking oxycodone 15 mg.   BP (!) 165/93 (BP Location: Left Arm, Patient Position: Sitting, Cuff Size: Large)   Pulse 69   Temp 98.1 F (36.7 C) (Oral)   Resp 18   Wt 257 lb 3.2 oz (116.7 kg)   SpO2 100%   BMI 40.28 kg/m  Wt Readings from Last 3 Encounters:  07/22/16 257 lb 3.2 oz (116.7 kg)  07/17/16 257 lb 9.6 oz (116.8 kg)  07/16/16 257 lb (116.6 kg)

## 2016-07-22 NOTE — Progress Notes (Signed)
As ordered by Shona Simpson, PA-C painted area of right chest wall with open moist desquamation with Gentian violet. Patient understands to return next week for skin check and call with any future needs.

## 2016-07-24 ENCOUNTER — Ambulatory Visit: Payer: Self-pay | Admitting: Radiation Oncology

## 2016-07-25 ENCOUNTER — Ambulatory Visit (HOSPITAL_BASED_OUTPATIENT_CLINIC_OR_DEPARTMENT_OTHER): Payer: 59 | Admitting: Oncology

## 2016-07-25 ENCOUNTER — Encounter: Payer: Self-pay | Admitting: *Deleted

## 2016-07-25 VITALS — BP 154/90 | HR 73 | Temp 98.2°F | Resp 18 | Ht 67.0 in | Wt 253.4 lb

## 2016-07-25 DIAGNOSIS — N951 Menopausal and female climacteric states: Secondary | ICD-10-CM

## 2016-07-25 DIAGNOSIS — C50311 Malignant neoplasm of lower-inner quadrant of right female breast: Secondary | ICD-10-CM

## 2016-07-25 DIAGNOSIS — Z170 Estrogen receptor positive status [ER+]: Secondary | ICD-10-CM | POA: Diagnosis not present

## 2016-07-25 DIAGNOSIS — C773 Secondary and unspecified malignant neoplasm of axilla and upper limb lymph nodes: Secondary | ICD-10-CM

## 2016-07-25 MED ORDER — VENLAFAXINE HCL ER 75 MG PO CP24: 75.0000 mg | ORAL_CAPSULE | Freq: Every day | ORAL | 4 refills | Status: DC

## 2016-07-25 NOTE — Progress Notes (Signed)
[place]  [phone] Fax:([phone]) [phone]   ID: [name] DOB: Jul 28, 1961  MR#: [id number]  HWK#:[id number]  Patient Care Team: Diane Morale, MD as PCP - Bladen III, MD as Consulting Physician (General Surgery) Diane Cruel, MD as Consulting Physician (Oncology) Diane Pita, MD as Consulting Physician (Radiation Oncology) Diane Bloom, MD (Anesthesiology) PCP: Diane Morale, MD GYN: OTHER MD: Diane Mainland MD, Diane Bloom MD  CHIEF COMPLAINT: Multicentric estrogen receptor positive breast cancer  CURRENT TREATMENT:  To start adjuvant anti-estrogens  BREAST CANCER HISTORY: From the original intake note:  [name] herself noted a change in her right breast in October and brought it to the attention of her primary care physician. Her last mammogram had been April 2010. Dr. Sarajane [name] set [name] up for bilateral diagnostic mammography with tomosynthesis and [facility] November 04/11/2015. This found the breast density to be category B. In the right breast there were 3 microlobulated masses in the lower inner quadrant measuring 1.9, 1.5, and 1.5 cm. These were multicentric. There were also diffuse fine pleomorphic calcifications surrounding these masses, that area spanning 11.6 cm. The masses were palpable by exam at 4:00 8 cm from the nipple and at 5:00 3 cm from the nipple. By ultrasonography there was an irregular hypoechoic mass in the right breast at 4:00 measuring 1.7 cm, 1 medial to this measuring 1.1 cm, and then the third irregular hypoechoic mass at the 5:00 position measuring 1.3 cm. The right axilla showed multiple lymph nodes with thickened cortices. The largest lymph node measured 0.9 cm.  On 08/02/2015, [name] underwent biopsy of all 3 breast masses as well as a right axillary lymph node. 2 of the tumors were similar invasive ductal carcinomas, grade 2, estrogen receptor 95% positive,  progesterone receptor 5% positive, with an MIB-1 of 10%, and HER-2 equivocal, with a signals ratio of 1.30 and the number per cell 4.0. The third tumor appeared's grade 1 or 2 was also estrogen receptor positive at 95%, progesterone receptor positive at 5%, with an MIP-1 of 5%, and a similar HER-2 profile the signals ratio being 1.46 but the number per cell being only 3.95. By immunohistochemistry on this tumor HER-2 was negative at 1+. Immunohistochemistry on the equivocal reading is pending.  [name]'s subsequent history is as detailed below  INTERVAL HISTORY: [name] returns today for follow-up of her estrogen receptor positive breast cancer. Since the last visit here she completed her radiation treatments, the last day being 07/17/2016 she generally did well with these except that her skin is "very raw" and painful. She is still using the creams and in addition Neosporin. Of course her pain is managed through the pain clinic and she is using methadone and oxycodone for that.    REVIEW OF SYSTEMS: Aside from pain in the irradiated area, [name] has the background back and joint pain from her arthritis. This is stable. She has heartburn well managed on omeprazole. She is having hot flashes, which improved slightly with the nighttime gabapentin but are still a concern. The plus side her hair is coming back. A detailed review of systems today was otherwise stable  PAST MEDICAL HISTORY: Past Medical History:  Diagnosis Date  . Breast cancer ([place])   . Breast cancer of lower-inner quadrant of right female breast ([place]) 08/08/2015  . Colon polyps   . DDD (degenerative disc disease), cervical   . Degenerative joint disease   . Diabetes mellitus without complication (  [facility])   . Diverticulosis   . Gallstones   . GERD (gastroesophageal reflux disease)   . Headache(784.0)    migraine like per patient  . Hypertension   . Low back pain    dr [name], pain management  . Osteoarthritis     PAST SURGICAL  HISTORY: Past Surgical History:  Procedure Laterality Date  . CHOLECYSTECTOMY  10/28/2011   Procedure: LAPAROSCOPIC CHOLECYSTECTOMY WITH INTRAOPERATIVE CHOLANGIOGRAM;  Surgeon: Diane Regal, MD;  Location: [facility];  Service: General;  Laterality: N/A;  . COLONOSCOPY WITH ESOPHAGOGASTRODUODENOSCOPY (EGD)  02/03/2013   with Propofol  . EXAM UNDER ANESTHESIA WITH MANIPULATION OF KNEE Right 05/30/2003  . EXAM UNDER ANESTHESIA WITH MANIPULATION OF KNEE Left 12/27/2002  . GANGLION CYST EXCISION Right    right wrist  . KNEE ARTHROSCOPY Bilateral 01/02/2000  . KNEE ARTHROSCOPY Right   . LAPAROSCOPIC VAGINAL HYSTERECTOMY WITH SALPINGO OOPHORECTOMY Bilateral 07/13/2006  . LIPOMA EXCISION Right 02/21/2008   hip  . MASS EXCISION  08/24/2012   Procedure: EXCISION MASS;  Surgeon: Diane Regal, MD;  Location: [facility];  Service: General;  Laterality: Left;  excisie soft tissue masses left shoulder(back) & left upper arm  . MASS EXCISION Left 01/24/2014   Procedure: EXCISION SOFT TISSUE MASSES LOWER LEFT BACK;  Surgeon: Diane Regal, MD;  Location: [facility];  Service: General;  Laterality: Left;  Marland Kitchen MASS EXCISION Right 04/30/2016   Procedure: EXCISION OF SKIN RIGHT CHEST WALL;  Surgeon: Diane Messing III, MD;  Location: [facility];  Service: General;  Laterality: Right;  EXCISION OF SKIN RIGHT CHEST WALL  . MASTECTOMY W/ SENTINEL NODE BIOPSY Right 10/01/2015   Procedure: RIGHT MASTECTOMY WITH SENTINEL LYMPH NODE BIOPSY;  Surgeon: Diane Messing III, MD;  Location: [facility];  Service: General;  Laterality: Right;  . PORT-A-CATH REMOVAL Left 04/30/2016   Procedure: REMOVAL PORT-A-CATH;  Surgeon: Diane Messing III, MD;  Location: [facility];  Service: General;  Laterality: Left;  REMOVAL PORT-A-CATH  . PORTACATH PLACEMENT Left 11/01/2015   Procedure: INSERTION PORT-A-CATH;  Surgeon: Diane Messing III, MD;  Location: [facility];  Service: General;  Laterality:  Left;  . REPLACEMENT UNICONDYLAR JOINT KNEE Left 04/20/2001  . REVISION TOTAL KNEE ARTHROPLASTY Right 06/18/2004  . REVISION TOTAL KNEE ARTHROPLASTY Left 11/15/2002  . REVISION TOTAL KNEE ARTHROPLASTY Left 10/12/2001  . REVISION TOTAL KNEE ARTHROPLASTY Right 07/09/2015  . TONSILLECTOMY    . TOTAL KNEE ARTHROPLASTY Right 04/25/2003  . TOTAL KNEE ARTHROPLASTY Left 06/25/2009    FAMILY HISTORY Family History  Problem Relation Age of Onset  . Stomach cancer Mother   . Heart disease Father   . Prostate cancer Father   . Kidney cancer Sister     one out of the four  . Hypertension Sister   . Hypertension Brother   . Crohn's disease Other     nephew  . Colon cancer Maternal Grandmother   Ms. Ricklefs is a real long radiation since she was coming to see me today if she wanted to she could drop by downstairs would see her and she would like to review them does let them know that she is originally The patient's father died at the age of 27 with congestive heart failure. Her mother is still living as of November 2016, age 39. The patient had 6 brothers, 4 sisters. One sister was diagnosed with kidney cancer at age 5. ([place]). She is doing well. The patient's  maternal grandmother was diagnosed with colon cancer at the age of 24. The patient's mother was diagnosed with a "rare stomach cancer" 20 years ago. The patient's father was diagnosed with prostate cancer at age 21. There is no history of breast or ovarian cancer in the family to her knowledge.   GYNECOLOGIC HISTORY:  No LMP recorded. Patient has had a hysterectomy.  menarche age 39, first live birth age 44, the patient is GX P3. She underwent remote hysterectomy with bilateral salpingo-oophorectomy. She did not use hormone replacement. She never used oral contraceptives.   SOCIAL HISTORY:  Kaliah used to work as a "[employer] but she is now disabled because of her multiple arthritic and degenerative problems. Her husband Diane Fortis  A. [name] works as a Freight forwarder. Son [name] works in long care in [place]. Son [name] works for a Arboriculturist in [place]. Daughter  [name] is a Haematologist in [place]. The patient has 5 grandchildren. She attends a Physicist, medical church    ADVANCED DIRECTIVES: Not in place   HEALTH MAINTENANCE: Social History  Substance Use Topics  . Smoking status: Current Every Day Smoker    Packs/day: 0.25    Years: 20.00    Types: Cigarettes  . Smokeless tobacco: Former Systems developer    Quit date: 09/30/2015     Comment: 1/2 pack per day  . Alcohol use No     Colonoscopy:February 2016   NOI:BBCWUG post hysterectomy   Bone density:  Lipid panel:  Allergies  Allergen Reactions  . Codeine Itching and Nausea And Vomiting    Itching all over the body  . Latex Hives  . Sulfonamide Derivatives Hives    All over the body    Current Outpatient Prescriptions  Medication Sig Dispense Refill  . amitriptyline (ELAVIL) 25 MG tablet Take 25 mg by mouth at bedtime.     Marland Kitchen amLODipine (NORVASC) 5 MG tablet TAKE 1 TABLET(5 MG) BY MOUTH TWICE DAILY 180 tablet 3  . furosemide (LASIX) 20 MG tablet Take 1 tablet (20 mg total) by mouth daily. 90 tablet 3  . gabapentin (NEURONTIN) 300 MG capsule Take 1 capsule (300 mg total) by mouth at bedtime. 90 capsule 4  . glucose blood (ONE TOUCH TEST STRIPS) test strip Dispense Ultra Mini, test once per day and code is E 11.9 100 each 1  . Lancets (ONETOUCH ULTRASOFT) lancets Test once per day and diagnosis code is E 11.9 100 each 1  . lisinopril (PRINIVIL,ZESTRIL) 10 MG tablet TAKE 1 TABLET(10 MG) BY MOUTH DAILY 90 tablet 0  . LORazepam (ATIVAN) 1 MG tablet Take 1 mg by mouth every 8 (eight) hours.    . metFORMIN (GLUCOPHAGE) 500 MG tablet TAKE 1 TABLET(500 MG) BY MOUTH TWICE DAILY WITH A MEAL 180 tablet 0  . methadone (DOLOPHINE) 10 MG tablet Take 10 mg by mouth every 8 (eight) hours as needed for moderate pain.     .  metoprolol (LOPRESSOR) 50 MG tablet Take 100 mg by mouth 2 (two) times daily.    . non-metallic deodorant Jethro Poling) MISC Apply 1 application topically daily.    Marland Kitchen omeprazole (PRILOSEC) 40 MG capsule TAKE 1 CAPSULE(40 MG) BY MOUTH DAILY 90 capsule 1  . oxyCODONE (ROXICODONE) 15 MG immediate release tablet Take 15 mg by mouth every 6 (six) hours as needed for pain. Reported on 12/10/2015    . potassium chloride (K-DUR,KLOR-CON) 10 MEQ tablet TAKE 2 TABLETS(20 MEQ) BY MOUTH TWICE DAILY 360 tablet 3  .  temazepam (RESTORIL) 30 MG capsule TK 1 C PO QD HS PRN  1  . venlafaxine XR (EFFEXOR-XR) 75 MG 24 hr capsule Take 1 capsule (75 mg total) by mouth daily with breakfast. 90 capsule 4  . Wound Dressings (SONAFINE) Apply 1 application topically 2 (two) times daily.    Marland Kitchen zolpidem (AMBIEN) 10 MG tablet Take 1 tablet (10 mg total) by mouth at bedtime as needed for sleep. 30 tablet 5   No current facility-administered medications for this visit.     OBJECTIVE: middle-aged African-American woman who appears stated age  55:   07/25/16 0919  BP: (!) 154/90  Pulse: 73  Resp: 18  Temp: 98.2 F (36.8 C)     Body mass index is 39.69 kg/m.    ECOG FS:1 - Symptomatic but completely ambulatory  Sclerae unicteric, EOMs intact Oropharynx clear and moist No cervical or supraclavicular adenopathy Lungs no rales or rhonchi Heart regular rate and rhythm Abd soft, nontender, positive bowel sounds MSK no focal spinal tenderness, no upper extremity lymphedema Neuro: nonfocal, well oriented, appropriate affect Breasts: The right breast is status post mastectomy and radiation. There is hyperpigmentation through the radiation field and also significant desquamation. There is no evidence of disease recurrence. The right axilla is benign. The left breast is unremarkable R he is   LAB RESULTS:  CMP     Component Value Date/Time   NA 145 06/09/2016 1213   K 2.9 (LL) 06/09/2016 1213   CL 101 04/28/2016 1213   CO2  30 (H) 06/09/2016 1213   GLUCOSE 88 06/09/2016 1213   BUN 9.2 06/09/2016 1213   CREATININE 0.8 06/09/2016 1213   CALCIUM 9.5 06/09/2016 1213   PROT 7.4 06/09/2016 1213   ALBUMIN 4.0 06/09/2016 1213   AST 20 06/09/2016 1213   ALT 14 06/09/2016 1213   ALKPHOS 78 06/09/2016 1213   BILITOT 0.42 06/09/2016 1213   GFRNONAA >60 01/09/2016 1841   GFRAA >60 01/09/2016 1841    INo results found for: SPEP, UPEP  Lab Results  Component Value Date   WBC 5.7 06/09/2016   NEUTROABS 3.3 06/09/2016   HGB 12.5 06/09/2016   HCT 37.6 06/09/2016   MCV 89.1 06/09/2016   PLT 283 06/09/2016      Chemistry      Component Value Date/Time   NA 145 06/09/2016 1213   K 2.9 (LL) 06/09/2016 1213   CL 101 04/28/2016 1213   CO2 30 (H) 06/09/2016 1213   BUN 9.2 06/09/2016 1213   CREATININE 0.8 06/09/2016 1213      Component Value Date/Time   CALCIUM 9.5 06/09/2016 1213   ALKPHOS 78 06/09/2016 1213   AST 20 06/09/2016 1213   ALT 14 06/09/2016 1213   BILITOT 0.42 06/09/2016 1213       No results found for: LABCA2  No components found for: LABCA125  No results for input(s): INR in the last 168 hours.  Urinalysis    Component Value Date/Time   COLORURINE YELLOW 01/09/2016 1948   APPEARANCEUR CLEAR 01/09/2016 1948   LABSPEC 1.010 02/19/2016 1355   PHURINE 6.0 02/19/2016 1355   PHURINE 6.0 01/09/2016 1948   GLUCOSEU Negative 02/19/2016 1355   HGBUR Trace 02/19/2016 1355   HGBUR TRACE (A) 01/09/2016 1948   HGBUR large 07/01/2010 0945   BILIRUBINUR Negative 02/19/2016 1355   KETONESUR Negative 02/19/2016 1355   [place] 01/09/2016 1948   PROTEINUR Negative 02/19/2016 1355   PROTEINUR NEGATIVE 01/09/2016 1948   UROBILINOGEN 0.2 02/19/2016  1355   NITRITE Negative 02/19/2016 1355   NITRITE NEGATIVE 01/09/2016 1948   LEUKOCYTESUR Small 02/19/2016 1355    STUDIES: No results found.  ASSESSMENT: 55 y.o. [place] woman status post right breast lower inner quadrant biopsy 3  and axillary lymph node biopsy 08/02/2015 for a clinical mpT1c N0, stage IA invasive ductal carcinoma, grade 1 or 2, estrogen receptor 95% positive, progesterone receptor 5% positive, with an MIB-1 between 5 and 10%, and HER-2 equivocal on 1 of the 2 biopsies (signals ratio 1.30, number per cell 4.0).  (1) status post right modified radical mastectomy 10/01/2015 for an mpT2 pN0, stage IIA invasive ductal carcinoma, grade 2, with a total of 20 benign lymph nodes removed  (2) Oncotype DX score of 32 predicts an outside the breast recurrence risk of 22% within 10 years if the patient's only systemic therapy is tamoxifen for 5 years. It also predicts significant benefit from adjuvant chemotherapy.  (3) patient completed adjuvant doxorubicin and cyclophosphamide in dose dense fashion 4, last dose 12/24/2015, followed by paclitaxel weekly starting 01/07/2016  (a) completed 10 of 12 planned paclitaxel doses, final 2 doses omitted because of neuropathy concerns   (4) postmastectomy radiation 06/02/16-07/17/16 1.  The right chest wall and supraclavicular region was treated to 50.4 Gy in 28 fractions of 1.8 Gy 2.  The mastectomy site was boosted to 60.4 Gy with 5 fractions of 2 Gy  (5) to start anastrozole 08/22/2016    PLAN: [name] has completed her local treatment for the breast cancer and is now ready to move on to anti-estrogens.  We discussed the difference between tamoxifen and anastrozole in detail. She understands that anastrozole and the aromatase inhibitors in general work by blocking estrogen production. Accordingly vaginal dryness, decrease in bone density, and of course hot flashes can result. The aromatase inhibitors can also negatively affect the cholesterol profile, although that is a minor effect. One out of 5 women on aromatase inhibitors we will feel "old and achy". This arthralgia/myalgia syndrome, which resembles fibromyalgia clinically, does resolve with stopping the medications.  Accordingly this is not a reason to not try an aromatase inhibitor but it is a frequent reason to stop it (in other words 20% of women will not be able to tolerate these medications).  Tamoxifen on the other hand does not block estrogen production. It does not "take away a woman's estrogen". It blocks the estrogen receptor in breast cells. Like anastrozole, it can also cause hot flashes. As opposed to anastrozole, tamoxifen has many estrogen-like effects. It is technically an estrogen receptor modulator. This means that in some tissues tamoxifen works like estrogen-- for example it helps strengthen the bones. It tends to improve the cholesterol profile. It can cause thickening of the endometrial lining, and even endometrial polyps or rarely cancer of the uterus.(The risk of uterine cancer due to tamoxifen is one additional cancer per thousand women year). It can cause vaginal wetness or stickiness. It can cause blood clots through this estrogen-like effect--the risk of blood clots with tamoxifen is exactly the same as with birth control pills or hormone replacement.  Neither of these agents causes mood changes or weight gain, despite the popular belief that they can have these side effects. We have data from studies comparing either of these drugs with placebo, and in those cases the control group had the same amount of weight gain and depression as the group that took the drug.  After all this discussion, her preference is to start with anastrozole  and we have made 08/22/2016 the target start date. I would like her to recover a bit more from her radiation changes: Her skin is still pretty raw and she has additional pain, above and beyond her baseline, due to that problem. I would not want those side effects to be confused with the aches and pains that can develop from anastrozole. She is going to stop downstairs to see the radiation team and they may want to repeat the gentian violet treatment  She is also  having significant hot flashes I am starting her on venlafaxine 75 mg daily now. Hopefully this will improve that particular problem  She has not had a bone density. I am setting her up for a bone density before her return visit here in February. She is tolerating the anastrozole well at that time I will start seeing her on an every six-month basis area  She has a good understanding of this plan. She agrees with it. She knows to call for any problems that may develop before her next visit.    Diane Cruel, MD   07/25/2016 9:22 AM

## 2016-07-25 NOTE — Progress Notes (Signed)
[name] in today for a reapplication of Gentian Violet to her right chest wall.  Note moisture in the rigjht chest wall crease and mid moisture in the lower chest wall region. Application completed at 10:03am.  Wearing white, large tissue to prevent abrasion of the prior tx field.  Discovered she was applying neosporin to the creased area, therefore, educated to forego this since it breeds more moisture in this area and may delay cell recovery/healing in this area. She stated understanding. She will return in one week for skin check. Will call if she has concerns.

## 2016-07-29 ENCOUNTER — Other Ambulatory Visit: Payer: Self-pay | Admitting: *Deleted

## 2016-07-29 ENCOUNTER — Ambulatory Visit
Admission: RE | Admit: 2016-07-29 | Discharge: 2016-07-29 | Disposition: A | Discharge status: Home / Self Care | Payer: 59 | Source: Ambulatory Visit | Attending: Radiation Oncology | Admitting: Radiation Oncology

## 2016-07-29 ENCOUNTER — Ambulatory Visit: Payer: 59 | Admitting: Oncology

## 2016-07-29 DIAGNOSIS — C50911 Malignant neoplasm of unspecified site of right female breast: Secondary | ICD-10-CM | POA: Diagnosis not present

## 2016-07-29 DIAGNOSIS — Z7984 Long term (current) use of oral hypoglycemic drugs: Secondary | ICD-10-CM | POA: Diagnosis not present

## 2016-07-29 DIAGNOSIS — Z79899 Other long term (current) drug therapy: Secondary | ICD-10-CM | POA: Insufficient documentation

## 2016-07-29 DIAGNOSIS — Z885 Allergy status to narcotic agent status: Secondary | ICD-10-CM | POA: Insufficient documentation

## 2016-07-29 DIAGNOSIS — Z888 Allergy status to other drugs, medicaments and biological substances status: Secondary | ICD-10-CM | POA: Insufficient documentation

## 2016-07-29 DIAGNOSIS — Z923 Personal history of irradiation: Secondary | ICD-10-CM | POA: Insufficient documentation

## 2016-07-29 DIAGNOSIS — Z9011 Acquired absence of right breast and nipple: Secondary | ICD-10-CM | POA: Diagnosis not present

## 2016-07-29 DIAGNOSIS — Z170 Estrogen receptor positive status [ER+]: Secondary | ICD-10-CM | POA: Diagnosis present

## 2016-07-29 DIAGNOSIS — C50311 Malignant neoplasm of lower-inner quadrant of right female breast: Secondary | ICD-10-CM

## 2016-07-29 MED ORDER — ANASTROZOLE 1 MG PO TABS: 1.0000 mg | ORAL_TABLET | Freq: Every day | ORAL | 3 refills | Status: DC

## 2016-07-30 NOTE — Progress Notes (Signed)
Late entry from 07/29/16 at 1145. Patient presented to the clinic for a skin check s/p completion of xrt. Healing noted right chest wall displaying great improvement from last check. Area of moist desquamation noted center of right chest wall approximately 3" long. No fever felt right chest wall.  Per Shona Simpson, PA-C educated patient about use of hydrogel pads. Patient verbalized understanding. Patient understands to contact this staff with future needs.

## 2016-07-31 NOTE — Progress Notes (Signed)
Radiation Oncology         [phone] ________________________________  Name: [name] MRN: [id number]  Date: 07/29/2016  DOB: 03/16/1961  Post Treatment Note  CC: Laurey Morale, MD  Laurey Morale, MD  Diagnosis:   Stage IIA, T2N0, ER/PR positive, Invasive ductal carcinoma of the right breast  Interval Since Last Radiation:  2 weeks   06/02/16-07/17/16: 1.  The right chest wall and supraclavicular region was treated to 50.4 Gy in 28 fractions of 1.8 Gy 2.  The mastectomy site was boosted to 60.4 Gy with 5 fractions of 2 Gy  Narrative:  The patient returns today for routine follow-up. During treatment she did very well with radiotherapy and did not have significant desquamation.                             On review of systems, the patient states that she is doing well since her last visit. Her skin is not causing any pain, and she's pleased with her progress since she completed radiation. She has plans to start estrogen blockade in the next 1-2 weeks.  ALLERGIES:  is allergic to codeine; latex; and sulfonamide derivatives.  Meds: Current Outpatient Prescriptions  Medication Sig Dispense Refill  . amitriptyline (ELAVIL) 25 MG tablet Take 25 mg by mouth at bedtime.     Marland Kitchen amLODipine (NORVASC) 5 MG tablet TAKE 1 TABLET(5 MG) BY MOUTH TWICE DAILY 180 tablet 3  . anastrozole (ARIMIDEX) 1 MG tablet Take 1 tablet (1 mg total) by mouth daily. 30 tablet 3  . furosemide (LASIX) 20 MG tablet Take 1 tablet (20 mg total) by mouth daily. 90 tablet 3  . gabapentin (NEURONTIN) 300 MG capsule Take 1 capsule (300 mg total) by mouth at bedtime. 90 capsule 4  . glucose blood (ONE TOUCH TEST STRIPS) test strip Dispense Ultra Mini, test once per day and code is E 11.9 100 each 1  . Lancets (ONETOUCH ULTRASOFT) lancets Test once per day and diagnosis code is E 11.9 100 each 1  . lisinopril (PRINIVIL,ZESTRIL) 10 MG tablet TAKE 1 TABLET(10 MG) BY MOUTH DAILY 90 tablet 0  . LORazepam (ATIVAN) 1 MG  tablet Take 1 mg by mouth every 8 (eight) hours.    . metFORMIN (GLUCOPHAGE) 500 MG tablet TAKE 1 TABLET(500 MG) BY MOUTH TWICE DAILY WITH A MEAL 180 tablet 0  . methadone (DOLOPHINE) 10 MG tablet Take 10 mg by mouth every 8 (eight) hours as needed for moderate pain.     . metoprolol (LOPRESSOR) 50 MG tablet Take 100 mg by mouth 2 (two) times daily.    . non-metallic deodorant Jethro Poling) MISC Apply 1 application topically daily.    Marland Kitchen omeprazole (PRILOSEC) 40 MG capsule TAKE 1 CAPSULE(40 MG) BY MOUTH DAILY 90 capsule 1  . oxyCODONE (ROXICODONE) 15 MG immediate release tablet Take 15 mg by mouth every 6 (six) hours as needed for pain. Reported on 12/10/2015    . potassium chloride (K-DUR,KLOR-CON) 10 MEQ tablet TAKE 2 TABLETS(20 MEQ) BY MOUTH TWICE DAILY 360 tablet 3  . temazepam (RESTORIL) 30 MG capsule TK 1 C PO QD HS PRN  1  . venlafaxine XR (EFFEXOR-XR) 75 MG 24 hr capsule Take 1 capsule (75 mg total) by mouth daily with breakfast. 90 capsule 4  . Wound Dressings (SONAFINE) Apply 1 application topically 2 (two) times daily.    Marland Kitchen zolpidem (AMBIEN) 10 MG tablet Take 1 tablet (10 mg  total) by mouth at bedtime as needed for sleep. 30 tablet 5   No current facility-administered medications for this encounter.     Physical Findings:  vitals were not taken for this visit. In general this is a well appearing African American female in no acute distress. She's alert and oriented x4 and appropriate throughout the examination. Cardiopulmonary assessment is negative for acute distress and she exhibits normal effort. The right breast was examined and reveals minimal wet desquamation of the lateral aspect superior to her mastectomy scar without superinfection, candida, or other new sites of breakdown.   Lab Findings: Lab Results  Component Value Date   WBC 5.7 06/09/2016   HGB 12.5 06/09/2016   HCT 37.6 06/09/2016   MCV 89.1 06/09/2016   PLT 283 06/09/2016     Radiographic Findings: No results  found.  Impression/Plan: 1. Stage IIA, T2N0, ER/PR positive, Invasive ductal carcinoma of the right breast. The patient has been coming for skin checks weekly since completing treatment and has noticed improvement since her last application of gentin violet. We discussed now the use of hydrogel pad in the contour of the outer aspect of her mastectomy scar line. We have provided education and reviewed skin care with hydrogel dressings. She will return on 11/30 for a 1 month follow up visit with me and we will reassess her skin unless she has questions prior to that visit. She will begin estrogen blockade as well per Dr. Jana Hakim.        Carola Rhine, PAC

## 2016-08-07 ENCOUNTER — Other Ambulatory Visit: Payer: Self-pay | Admitting: Family Medicine

## 2016-08-20 NOTE — Progress Notes (Signed)
[name]  is here for a one month follow up appintment for right breast cancer.   Skin status:Right breast with hyperpigmentation What lotion are you using? Using vitamin E oil starting today. Have you seen med onc? If not, when is appointment:07-25-16 Dr. Jana Hakim If they are ER+, have they started Al or Tamoxifen? If not, why? 08-22-16 Anastrozole Discuss survivorship appointment. 08-21-16 Mike Craze, NP Have you had a mammogram scheduled? Not scheduled yet Offer referral to [facility]. 08-21-16 Appetite:Good Pain:4/10 Right breast taking Oxycodone Fatigue:Having fatigue mid day. Arm mobility:Able to raise right arm with some discomfort. PT ordered today by Mike Craze, NP Wt Readings from Last 3 Encounters:  08/21/16 252 lb (114.3 kg)  08/21/16 252 lb (114.3 kg)  07/25/16 253 lb 6.4 oz (114.9 kg)  BP (!) 147/82   Pulse 71   Temp 97.7 F (36.5 C) (Oral)   Resp 20   Ht 5\' 7"  (1.702 m)   Wt 252 lb (114.3 kg)   SpO2 100%   BMI 39.47 kg/m

## 2016-08-21 ENCOUNTER — Encounter: Payer: 59 | Admitting: Adult Health

## 2016-08-21 ENCOUNTER — Ambulatory Visit (HOSPITAL_BASED_OUTPATIENT_CLINIC_OR_DEPARTMENT_OTHER): Payer: 59 | Admitting: Adult Health

## 2016-08-21 ENCOUNTER — Encounter: Payer: Self-pay | Admitting: Radiation Oncology

## 2016-08-21 ENCOUNTER — Ambulatory Visit
Admission: RE | Admit: 2016-08-21 | Discharge: 2016-08-21 | Disposition: A | Discharge status: Home / Self Care | Payer: 59 | Source: Ambulatory Visit | Attending: Radiation Oncology | Admitting: Radiation Oncology

## 2016-08-21 VITALS — BP 151/79 | HR 74 | Temp 98.3°F | Resp 18 | Ht 67.0 in | Wt 252.0 lb

## 2016-08-21 VITALS — BP 147/82 | HR 71 | Temp 97.7°F | Resp 20 | Ht 67.0 in | Wt 252.0 lb

## 2016-08-21 DIAGNOSIS — Z720 Tobacco use: Secondary | ICD-10-CM

## 2016-08-21 DIAGNOSIS — Z7984 Long term (current) use of oral hypoglycemic drugs: Secondary | ICD-10-CM | POA: Diagnosis not present

## 2016-08-21 DIAGNOSIS — Z9011 Acquired absence of right breast and nipple: Secondary | ICD-10-CM | POA: Insufficient documentation

## 2016-08-21 DIAGNOSIS — Z885 Allergy status to narcotic agent status: Secondary | ICD-10-CM | POA: Insufficient documentation

## 2016-08-21 DIAGNOSIS — C50311 Malignant neoplasm of lower-inner quadrant of right female breast: Secondary | ICD-10-CM

## 2016-08-21 DIAGNOSIS — G620 Drug-induced polyneuropathy: Secondary | ICD-10-CM | POA: Diagnosis not present

## 2016-08-21 DIAGNOSIS — M25611 Stiffness of right shoulder, not elsewhere classified: Secondary | ICD-10-CM

## 2016-08-21 DIAGNOSIS — Z170 Estrogen receptor positive status [ER+]: Secondary | ICD-10-CM | POA: Insufficient documentation

## 2016-08-21 DIAGNOSIS — N951 Menopausal and female climacteric states: Secondary | ICD-10-CM

## 2016-08-21 DIAGNOSIS — Z923 Personal history of irradiation: Secondary | ICD-10-CM | POA: Diagnosis not present

## 2016-08-21 DIAGNOSIS — Z79899 Other long term (current) drug therapy: Secondary | ICD-10-CM | POA: Diagnosis not present

## 2016-08-21 DIAGNOSIS — Z888 Allergy status to other drugs, medicaments and biological substances status: Secondary | ICD-10-CM | POA: Insufficient documentation

## 2016-08-21 DIAGNOSIS — C50911 Malignant neoplasm of unspecified site of right female breast: Secondary | ICD-10-CM | POA: Insufficient documentation

## 2016-08-21 DIAGNOSIS — Z1231 Encounter for screening mammogram for malignant neoplasm of breast: Secondary | ICD-10-CM

## 2016-08-21 DIAGNOSIS — Z9189 Other specified personal risk factors, not elsewhere classified: Secondary | ICD-10-CM

## 2016-08-21 DIAGNOSIS — C50411 Malignant neoplasm of upper-outer quadrant of right female breast: Secondary | ICD-10-CM

## 2016-08-21 NOTE — Progress Notes (Signed)
CLINIC:  Survivorship   REASON FOR VISIT:  Routine follow-up post-treatment for a recent history of breast cancer.  BRIEF ONCOLOGIC HISTORY:    Breast cancer of lower-inner quadrant of right female breast ([place])   08/02/2015 Initial Biopsy    (R) breast needle biopsy (4:00): IDC, DCIS with calcs, grade 1-2. (R) breast needle biopsy (4:00/2nd lesion): IDC. (R) breast needle biopsy (5:00): IDC, grade 2.  (R) axillary LN biopsy: Negative.  ER+ (95%), PR+ (5%), Ki67 5-10%. HER2 neg on 1 of the 2 biopsies; HER2 equivocal on another.       08/08/2015 Initial Diagnosis    Breast cancer of lower-inner quadrant of right female breast ([place])     10/01/2015 Oncotype testing    Recurrence score: 32; ROR 22% (high-risk)       10/01/2015 Surgery    (R) mastectomy with ALND Marlou Starks): IDC, grade 2, 2 foci with largest spanning 4.4 cm, high-grade DCIS with calcs.  0/20 LNs. HER2 repeated and remains equivocal (ratio 1.35).     mpT2,pN0: Stage IIA       11/06/2015 - 03/10/2016 Adjuvant Chemotherapy    Adriamycin/Cytoxan x 4 cycles, followed by Taxol x 10 of 12 planned cycles (last 2 cycles held d/t neuropathy).       06/02/2016 - 07/17/2016 Radiation Therapy    Adjuvant radiation Tammi Klippel). The right chest wall and supraclavicular region was treated to 50.4 Gy in 28 fractions of 1.8 Gy. The mastectomy site was boosted to 60.4 Gy with 5 fractions of 2 Gy        Anti-estrogen oral therapy    Anastrozole 1 mg daily. (to start 08/22/16). Planned duration of treatment: 5-10 years       INTERVAL HISTORY:  [name] presents to the [facility] today for our initial meeting to review her survivorship care plan detailing her treatment course for breast cancer, as well as monitoring long-term side effects of that treatment, education regarding health maintenance, screening, and overall wellness and health promotion.     Overall, [name] reports feeling quite well since completing her radiation  therapy approximately 1 month ago.  Her radiation course was complicated by dry and moist desquamation. She tells me that her skin is continuing to heal and "has gotten so much better."   She continues to have some fatigue since completing radiation, but feels like this is slowly getting better. She has not started her anti-estrogen therapy yet. She plans to start the anastrozole tomorrow, 08/22/16.  She continues to have some hot flashes. She tried taking the Effexor XR, but she did not like the way it made her feel. It upset her stomach and gave her nausea. She states "I will just deal with the hot flashes without any medicines." She has peripheral neuropathy since completing chemotherapy. She was having neuropathy to her bilateral hands and feet, which is much improved. She now is only experiencing peripheral neuropathy to her left buttock, which is also steadily improving. She was given gabapentin in the past, but does not take it as it gives her severe headaches.  She does have severe arthritis pain, which is chronic and long-standing. She is seen in a pain management clinic; she takes oxycodone and methadone for her pain.  She has not had her left breast unilateral screening mammogram yet for this year. She is scheduled for DEXA scan at the [facility] on 10/02/16.   She has an appointment to see Shona Simpson, PA-C in radiation oncology today  as well, to follow-up on her radiation skin changes. She continues to smoke cigarettes; she smokes about 4 cigarettes per day. She is smoking much less than she used to smoke in the past. She is interested in stopping smoking.   REVIEW OF SYSTEMS:  Review of Systems  Constitutional: Positive for fatigue.  HENT:  Negative.   Eyes: Negative.   Respiratory: Negative.   Cardiovascular: Negative.   Gastrointestinal: Negative.   Endocrine: Positive for hot flashes.  Genitourinary: Negative.  Negative for vaginal bleeding.   Musculoskeletal:  Positive for arthralgias and back pain.       (R) shoulder pain and decreased ROM   Neurological: Negative.   Hematological: Negative.   Psychiatric/Behavioral: Negative.   Breast: Denies any new nodularity, masses, nipple changes, or nipple discharge. (R) chest wall tenderness/intermittent sharp pain (improving).    A 14-point review of systems was completed and was negative, except as noted above.   ONCOLOGY TREATMENT TEAM:  1. Surgeon:  Dr. Marlou Starks at [facility] 2. Medical Oncologist: Dr. Jana Hakim 3. Radiation Oncologist: Dr. Tammi Klippel     PAST MEDICAL/SURGICAL HISTORY:  Past Medical History:  Diagnosis Date  . Breast cancer ([place])   . Breast cancer of lower-inner quadrant of right female breast ([place]) 08/08/2015  . Colon polyps   . DDD (degenerative disc disease), cervical   . Degenerative joint disease   . Diabetes mellitus without complication ([place])   . Diverticulosis   . Gallstones   . GERD (gastroesophageal reflux disease)   . Headache(784.0)    migraine like per patient  . Hypertension   . Low back pain    dr phillips, pain management  . Osteoarthritis    Past Surgical History:  Procedure Laterality Date  . CHOLECYSTECTOMY  10/28/2011   Procedure: LAPAROSCOPIC CHOLECYSTECTOMY WITH INTRAOPERATIVE CHOLANGIOGRAM;  Surgeon: Earnstine Regal, MD;  Location: [facility];  Service: General;  Laterality: N/A;  . COLONOSCOPY WITH ESOPHAGOGASTRODUODENOSCOPY (EGD)  02/03/2013   with Propofol  . EXAM UNDER ANESTHESIA WITH MANIPULATION OF KNEE Right 05/30/2003  . EXAM UNDER ANESTHESIA WITH MANIPULATION OF KNEE Left 12/27/2002  . GANGLION CYST EXCISION Right    right wrist  . KNEE ARTHROSCOPY Bilateral 01/02/2000  . KNEE ARTHROSCOPY Right   . LAPAROSCOPIC VAGINAL HYSTERECTOMY WITH SALPINGO OOPHORECTOMY Bilateral 07/13/2006  . LIPOMA EXCISION Right 02/21/2008   hip  . MASS EXCISION  08/24/2012   Procedure: EXCISION MASS;  Surgeon: Earnstine Regal, MD;  Location: [facility];  Service: General;  Laterality: Left;  excisie soft tissue masses left shoulder(back) & left upper arm  . MASS EXCISION Left 01/24/2014   Procedure: EXCISION SOFT TISSUE MASSES LOWER LEFT BACK;  Surgeon: Earnstine Regal, MD;  Location: [place] [facility];  Service: General;  Laterality: Left;  Marland Kitchen MASS EXCISION Right 04/30/2016   Procedure: EXCISION OF SKIN RIGHT CHEST WALL;  Surgeon: Autumn Messing III, MD;  Location: [facility];  Service: General;  Laterality: Right;  EXCISION OF SKIN RIGHT CHEST WALL  . MASTECTOMY W/ SENTINEL NODE BIOPSY Right 10/01/2015   Procedure: RIGHT MASTECTOMY WITH SENTINEL LYMPH NODE BIOPSY;  Surgeon: Autumn Messing III, MD;  Location: [facility];  Service: General;  Laterality: Right;  . PORT-A-CATH REMOVAL Left 04/30/2016   Procedure: REMOVAL PORT-A-CATH;  Surgeon: Autumn Messing III, MD;  Location: [facility];  Service: General;  Laterality: Left;  REMOVAL PORT-A-CATH  . PORTACATH PLACEMENT Left 11/01/2015   Procedure: INSERTION PORT-A-CATH;  Surgeon: Eddie Dibbles  Daiva Nakayama, MD;  Location: [facility];  Service: General;  Laterality: Left;  . REPLACEMENT UNICONDYLAR JOINT KNEE Left 04/20/2001  . REVISION TOTAL KNEE ARTHROPLASTY Right 06/18/2004  . REVISION TOTAL KNEE ARTHROPLASTY Left 11/15/2002  . REVISION TOTAL KNEE ARTHROPLASTY Left 10/12/2001  . REVISION TOTAL KNEE ARTHROPLASTY Right 07/09/2015  . TONSILLECTOMY    . TOTAL KNEE ARTHROPLASTY Right 04/25/2003  . TOTAL KNEE ARTHROPLASTY Left 06/25/2009     ALLERGIES:  Allergies  Allergen Reactions  . Codeine Itching and Nausea And Vomiting    Itching all over the body  . Latex Hives  . Sulfonamide Derivatives Hives    All over the body     CURRENT MEDICATIONS:  Outpatient Encounter Prescriptions as of 08/21/2016  Medication Sig Note  . amitriptyline (ELAVIL) 25 MG tablet Take 25 mg by mouth at bedtime.    Marland Kitchen amLODipine (NORVASC) 5 MG tablet TAKE 1 TABLET(5 MG) BY MOUTH TWICE  DAILY   . furosemide (LASIX) 20 MG tablet Take 1 tablet (20 mg total) by mouth daily.   Marland Kitchen glucose blood (ONE TOUCH TEST STRIPS) test strip Dispense Ultra Mini, test once per day and code is E 11.9   . lisinopril (PRINIVIL,ZESTRIL) 10 MG tablet TAKE 1 TABLET(10 MG) BY MOUTH DAILY   . metFORMIN (GLUCOPHAGE) 500 MG tablet TAKE 1 TABLET(500 MG) BY MOUTH TWICE DAILY WITH A MEAL   . methadone (DOLOPHINE) 10 MG tablet Take 10 mg by mouth every 8 (eight) hours as needed for moderate pain.    . metoprolol (LOPRESSOR) 50 MG tablet Take 100 mg by mouth 2 (two) times daily.   . non-metallic deodorant Jethro Poling) MISC Apply 1 application topically daily.   Marland Kitchen omeprazole (PRILOSEC) 40 MG capsule TAKE 1 CAPSULE(40 MG) BY MOUTH DAILY   . ONETOUCH DELICA LANCETS FINE MISC USE TO TEST ONCE DAILY   . oxyCODONE (ROXICODONE) 15 MG immediate release tablet Take 15 mg by mouth every 6 (six) hours as needed for pain. Reported on 12/10/2015   . potassium chloride (K-DUR,KLOR-CON) 10 MEQ tablet TAKE 2 TABLETS(20 MEQ) BY MOUTH TWICE DAILY   . zolpidem (AMBIEN) 10 MG tablet Take 10 mg by mouth at bedtime as needed for sleep.   Marland Kitchen anastrozole (ARIMIDEX) 1 MG tablet Take 1 tablet (1 mg total) by mouth daily. (Patient not taking: Reported on 08/21/2016)   . gabapentin (NEURONTIN) 300 MG capsule Take 1 capsule (300 mg total) by mouth at bedtime. (Patient not taking: Reported on 08/21/2016)   . LORazepam (ATIVAN) 1 MG tablet Take 1 mg by mouth every 8 (eight) hours.   Marland Kitchen venlafaxine XR (EFFEXOR-XR) 75 MG 24 hr capsule Take 1 capsule (75 mg total) by mouth daily with breakfast. (Patient not taking: Reported on 08/21/2016)   . zolpidem (AMBIEN) 10 MG tablet Take 1 tablet (10 mg total) by mouth at bedtime as needed for sleep.   . [DISCONTINUED] temazepam (RESTORIL) 30 MG capsule TK 1 C PO QD HS PRN 07/22/2016: Received from: External Pharmacy  . [DISCONTINUED] Wound Dressings (SONAFINE) Apply 1 application topically 2 (two) times daily.     No facility-administered encounter medications on file as of 08/21/2016.      ONCOLOGIC FAMILY HISTORY:  Family History  Problem Relation Age of Onset  . Stomach cancer Mother   . Heart disease Father   . Prostate cancer Father   . Kidney cancer Sister     one out of the four  . Hypertension Sister   . Hypertension  Brother   . Crohn's disease Other     nephew  . Colon cancer Maternal Grandmother      GENETIC COUNSELING/TESTING: None.   SOCIAL HISTORY:  [name] is married and lives with her husband in [place], [place].  She has 3 adult children.   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 She is smoking about 4 cigarettes per day; she is interested in stopping smoking.  Denies any current alcohol or illicit drug use.   PHYSICAL EXAMINATION:  Vital Signs:   Vitals:   08/21/16 1430  BP: (!) 151/79  Pulse: 74  Resp: 18  Temp: 98.3 F (36.8 C)   Filed Weights   08/21/16 1430  Weight: 252 lb (114.3 kg)   General: Well-nourished, well-appearing female in no acute distress.  She is unaccompanied today.  HEENT: Head is normocephalic.  Pupils equal and reactive to light. Conjunctivae clear without exudate.  Sclerae anicteric. Oral mucosa is pink, moist.  Oropharynx is pink without lesions or erythema.  Lymph: No cervical, supraclavicular, or infraclavicular lymphadenopathy noted on palpation.  Cardiovascular: Regular rate and rhythm.Marland Kitchen Respiratory: Clear to auscultation bilaterally. Chest expansion symmetric; breathing non-labored.  GI: Abdomen soft and round; non-tender, non-distended. Bowel sounds normoactive.  GU: Deferred.  Neuro/MSK: No focal deficits. Steady gait. Decreased right shoulder ROM; pain with right shoulder extension.  Psych: Mood and affect normal and appropriate for situation.  Extremities: No edema. Skin: Warm and dry.  LABORATORY DATA:   None for this visit.  DIAGNOSTIC IMAGING:  None for this visit.      ASSESSMENT AND PLAN:  Ms.. [name] is a pleasant 55 y.o. female with Stage IIA right breast invasive ductal carcinoma, ER+/PR+/HER2 equivocal, diagnosed in 07/2015;  treated with right mastectomy and axillary lymph node dissection, adjuvant chemotherapy with Adriamycin/Cytoxan x 4 cycles, followed by Taxol x 10 cycles (final 2 cycles of Taxol held d/t peripheral neuropathy).  She went on to have adjuvant radiation therapy, which completed on 07/17/16.  She will start anti-estrogen therapy with Anastrozole on 08/22/16.  She presents to the Survivorship Clinic for our initial meeting and routine follow-up post-completion of treatment for breast cancer.    1. Stage IIA right breast cancer:  [name] is continuing to recover from definitive treatment for breast cancer. She will see Shona Simpson, PA-C in [place] today to follow-up on her chest wall healing post-radiation. She is due for left breast unilateral screening mammogram. We will work on coordinating her mammogram with her upcoming DEXA scan scheduled for 10/02/16 at the [facility].  She will follow-up with her medical oncologist, Dr. Jana Hakim in 10/2016 with history and physical exam per surveillance protocol.   [name] plans to start her anti-estrogen therapy (Anastrozole) tomorrow, 08/22/16. Common side effects of anastrozole were again reviewed with her as well. Today, a comprehensive survivorship care plan and treatment summary was reviewed with the patient today detailing her breast cancer diagnosis, treatment course, potential late/long-term effects of treatment, appropriate follow-up care with recommendations for the future, and patient education resources.  A copy of this summary, along with a letter will be sent to the patient's primary care provider via mail/fax/In Basket message after today's visit.    2. Decreased right shoulder ROM/At risk for  lymphedema: [name] is experiencing right shoulder pain and decreased range of motion status post mastectomy and axillary lymph node dissection.  She tells me she has not been formally referred to physical therapy, and agrees to have me place those orders today. She is certainly at risk for right arm lymphedema as well, and may benefit from a prescription for her right arm lymphedema sleeve in the future, as directed by PT.    3. Hot flashes: [name] is experiencing hot flashes, prior to starting her antiestrogen therapy with anastrozole. To address her hot flashes and in anticipation of the hot flashes worsening with antiestrogen therapy, the patient was prescribed Effexor by Dr. Jana Hakim. She tried the Effexor, but stopped it as she did not like the way it made her feel and stopped taking it.  She prefers to "just deal with the hot flashes" rather than taking any additional prescription medications. We discussed that she could try vitamin E capsules at standard dosing (400 IU) once daily, which may offer her minimal improvement in her symptoms.  4. 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 Her symptoms are largely improving, but she continues to have left foot neuropathy. She tried taking gabapentin in the past, but this caused her to have severe headaches so she stopped the medication. We discussed that she could try the B complex vitamins, as they may be a more natural remedy that may provide her some benefit. She is willing to give the B vitamins a try.   5. Tobacco use disorder: [name] is smoking about 4 cigarettes per day. She is very interested in stopping smoking for good. Her biggest identified triggers are when she is home by herself. She states that when she is out with family and busy around the house, she does not have the urge to smoke.  We discussed different strategies to combat her nicotine  cravings. I do not think it is necessary for her to use a nicotine patch, given the limited number of cigarettes she is smoking per day. However, addressing her cravings is important in successful cessation attempts. Therefore, I recommended that she buy some nicotine gum, and use this when she has the craving to smoke. I gave her instructions on how to use the nicotine gum (chew only a few times and place the gum in the side of the mouth for best nicotine absorption).  We discussed that generally cravings only lasts 3-5 minutes, and if she is able to use some type of distraction along with the nicotine gum during that time, the craving for nicotine will likely pass. She is going to try these suggestions. She understands the importance of smoking cessation to her overall health, as well as reducing cancer risk. She was counseled on the importance of smoking cessation, and I encouraged her to reach out to me with other questions or for additional support. Greater than 3 minutes was provided in smoking cessation counseling today.  6. Bone health:  Given Ms. [name]'s age, history of breast cancer, and her upcoming treatment regimen including anti-estrogen therapy with anastrozole, she is at risk for bone demineralization.  We do not have any records of any previous DEXA scan imaging. She is scheduled for DEXA scan done on 10/02/16 at the [facility]. She understands that anastrozole may  weaken her bones. She was encouraged to increase her consumption of foods rich in calcium, as well as increase her weight-bearing activities.  She was given education on specific activities to promote bone health.  7. Cancer screening:  Due to [name]'s history  and her age, she should receive screening for skin cancers, colon cancer, and gynecologic cancers.  The information and recommendations are listed on the patient's comprehensive care plan/treatment summary and were reviewed in detail with the patient.     8. Health maintenance and wellness promotion: [name] was encouraged to consume 5-7 servings of fruits and vegetables per day. We reviewed the "Nutrition Rainbow" handout, as well as the handout "Take Control of Your Health and Reduce Your Cancer Risk" from the [facility].  She was also encouraged to engage in moderate to vigorous exercise for 30 minutes per day most days of the week. We discussed the [employer] fitness program, which is designed for cancer survivors to help them become more physically fit after cancer treatments.  She was instructed to limit her alcohol consumption and was encouraged stop smoking.     9. Support services/counseling: It is not uncommon for this period of the patient's cancer care trajectory to be one of many emotions and stressors.  We discussed an opportunity for her to participate in the next session of [facility] ("Finding Your New Normal") support group series designed for patients after they have completed treatment.   [name] was encouraged to take advantage of our many other support services programs, support groups, and/or counseling in coping with her new life as a cancer survivor after completing anti-cancer treatment.  She was offered support today through active listening and expressive supportive counseling.  She was given information regarding our available services and encouraged to contact me with any questions or for help enrolling in any of our support group/programs.    Dispo:   -Referral to PT for decreased ROM in right shoulder and lymphedema risk.  -DEXA scan scheduled for 10/02/16. -Left unilateral screening mammogram due; will try to coordinate with upcoming DEXA scan at the [facility] in 09/2016.  -Return to cancer center to see Dr. Jana Hakim in 10/2016 -She is welcome to return back to the Survivorship Clinic at any time; no additional follow-up needed at this time.  -Consider referral back to survivorship as a  long-term survivor for continued surveillance   A total of 40 minutes of face-to-face time was spent with this patient with greater than 50% of that time in counseling and care-coordination.   Mike Craze, NP Survivorship Program [place] [phone]   Note: PRIMARY CARE PROVIDER Laurey Morale, [place] [phone]

## 2016-08-22 ENCOUNTER — Encounter: Payer: Self-pay | Admitting: Adult Health

## 2016-08-22 ENCOUNTER — Telehealth: Payer: Self-pay | Admitting: *Deleted

## 2016-08-22 NOTE — Telephone Encounter (Signed)
Called and spoke with pt about appt dates and time. Verbalized understanding. No further concerns. Message fwd to G.Dawson,NP.

## 2016-08-22 NOTE — Progress Notes (Signed)
Radiation Oncology         [phone] ________________________________  Name: [name] MRN: [id number]  Date: 08/21/2016  DOB: [birth year]  Post Treatment Note  CC: Laurey Morale, MD  Autumn Messing III, MD  Diagnosis:   Stage IIA, T2N0, ER/PR positive, Invasive ductal carcinoma of the right breast  Interval Since Last Radiation:  4 weeks   06/02/16-07/17/16: 1.  The right chest wall and supraclavicular region was treated to 50.4 Gy in 28 fractions of 1.8 Gy 2.  The mastectomy site was boosted to 60.4 Gy with 5 fractions of 2 Gy  Narrative:  The patient returns today for routine follow-up. During treatment she did very well with radiotherapy and did have significant desquamation which was treated with Gentain Violet and ultimately hydrogel. She comes for a repeat skin check.                             On review of systems, the patient states that she is doing great since her last visit. Her skin is not causing any pain and is completley healed. She has plans to start estrogen blockade in the tomorrow. No other complaints are noted.  ALLERGIES:  is allergic to codeine; latex; and sulfonamide derivatives.  Meds: Current Outpatient Prescriptions  Medication Sig Dispense Refill  . amitriptyline (ELAVIL) 25 MG tablet Take 25 mg by mouth at bedtime.     Marland Kitchen amLODipine (NORVASC) 5 MG tablet TAKE 1 TABLET(5 MG) BY MOUTH TWICE DAILY 180 tablet 3  . furosemide (LASIX) 20 MG tablet Take 1 tablet (20 mg total) by mouth daily. 90 tablet 3  . glucose blood (ONE TOUCH TEST STRIPS) test strip Dispense Ultra Mini, test once per day and code is E 11.9 100 each 1  . lisinopril (PRINIVIL,ZESTRIL) 10 MG tablet TAKE 1 TABLET(10 MG) BY MOUTH DAILY 90 tablet 0  . metFORMIN (GLUCOPHAGE) 500 MG tablet TAKE 1 TABLET(500 MG) BY MOUTH TWICE DAILY WITH A MEAL 180 tablet 0  . methadone (DOLOPHINE) 10 MG tablet Take 10 mg by mouth every 8 (eight) hours as needed for moderate pain.     . metoprolol (LOPRESSOR)  50 MG tablet Take 100 mg by mouth 2 (two) times daily.    Marland Kitchen omeprazole (PRILOSEC) 40 MG capsule TAKE 1 CAPSULE(40 MG) BY MOUTH DAILY 90 capsule 1  . ONETOUCH DELICA LANCETS FINE MISC USE TO TEST ONCE DAILY 100 each 0  . oxyCODONE (ROXICODONE) 15 MG immediate release tablet Take 15 mg by mouth every 6 (six) hours as needed for pain. Reported on 12/10/2015    . potassium chloride (K-DUR,KLOR-CON) 10 MEQ tablet TAKE 2 TABLETS(20 MEQ) BY MOUTH TWICE DAILY 360 tablet 3  . venlafaxine XR (EFFEXOR-XR) 75 MG 24 hr capsule Take 1 capsule (75 mg total) by mouth daily with breakfast. 90 capsule 4  . zolpidem (AMBIEN) 10 MG tablet Take 10 mg by mouth at bedtime as needed for sleep.    Marland Kitchen anastrozole (ARIMIDEX) 1 MG tablet Take 1 tablet (1 mg total) by mouth daily. (Patient not taking: Reported on 08/21/2016) 30 tablet 3  . gabapentin (NEURONTIN) 300 MG capsule Take 1 capsule (300 mg total) by mouth at bedtime. (Patient not taking: Reported on 08/21/2016) 90 capsule 4  . LORazepam (ATIVAN) 1 MG tablet Take 1 mg by mouth every 8 (eight) hours.    Marland Kitchen zolpidem (AMBIEN) 10 MG tablet Take 1 tablet (10 mg total) by mouth at  bedtime as needed for sleep. 30 tablet 5   No current facility-administered medications for this encounter.     Physical Findings:  height is 5\' 7"  (1.702 m) and weight is 252 lb (114.3 kg). Her oral temperature is 97.7 F (36.5 C). Her blood pressure is 147/82 (abnormal) and her pulse is 71. Her respiration is 20 and oxygen saturation is 100%.  In general this is a well appearing African American female in no acute distress. She's alert and oriented x4 and appropriate throughout the examination. Cardiopulmonary assessment is negative for acute distress and she exhibits normal effort. The right chest wall was examined and reveals resolution of the desquamation and hyperpigmentation diffusely of the right supraclavicular site and chest wall without any sites of breakdown.   Lab Findings: Lab  Results  Component Value Date   WBC 5.7 06/09/2016   HGB 12.5 06/09/2016   HCT 37.6 06/09/2016   MCV 89.1 06/09/2016   PLT 283 06/09/2016     Radiographic Findings: No results found.  Impression/Plan: 1. Stage IIA, T2N0, ER/PR positive, Invasive ductal carcinoma of the right breast. The patient has recovered very well sine radiation completed. She will keep her skin on the upper chest wall and along the based of the neck covered and/or use sunscreen when outdoors. We advised keeping her skin dry to allow for sloughing of the dermis which will improve her hyperpigmentation long term. She will begin estrogen blockade as well per Dr. Jana Hakim.  We will follow up with the patient on an as needed basis moving forward.     Carola Rhine, PAC

## 2016-08-22 NOTE — Telephone Encounter (Signed)
Called to inform pt of future appts. Pt not home but left appt times with her husband. I told him I would call her back later this evening when she will be available so I can make sure she understands appointments. Message to be fwd to [employer].

## 2016-08-26 ENCOUNTER — Ambulatory Visit: Payer: 59 | Attending: Adult Health | Admitting: Physical Therapy

## 2016-08-26 DIAGNOSIS — M25611 Stiffness of right shoulder, not elsewhere classified: Secondary | ICD-10-CM | POA: Diagnosis present

## 2016-08-26 DIAGNOSIS — M79621 Pain in right upper arm: Secondary | ICD-10-CM | POA: Insufficient documentation

## 2016-08-26 DIAGNOSIS — L599 Disorder of the skin and subcutaneous tissue related to radiation, unspecified: Secondary | ICD-10-CM | POA: Insufficient documentation

## 2016-08-26 DIAGNOSIS — R293 Abnormal posture: Secondary | ICD-10-CM | POA: Insufficient documentation

## 2016-08-26 NOTE — Therapy (Signed)
[name], [place], [address] Phone: [phone]   Fax:  [phone]  Physical Therapy Evaluation  Patient Details  Name: [name] MRN: [id number] Date of Birth: 1961/08/03 Referring Provider: Mike [name]   Encounter Date: 08/26/2016      PT End of Session - 08/26/16 1737    Visit Number 1   Number of Visits 9   Date for PT Re-Evaluation 09/30/16   PT Start Time 1350   PT Stop Time 1430   PT Time Calculation (min) 40 min   Activity Tolerance Patient tolerated treatment well   Behavior During Therapy [facility] for tasks assessed/performed      Past Medical History:  Diagnosis Date  . Breast cancer ([place])   . Breast cancer of lower-inner quadrant of right female breast ([place]) 08/08/2015  . Colon polyps   . DDD (degenerative disc disease), cervical   . Degenerative joint disease   . Diabetes mellitus without complication ([place])   . Diverticulosis   . Gallstones   . GERD (gastroesophageal reflux disease)   . Headache(784.0)    migraine like per patient  . Hypertension   . Low back pain    dr phillips, pain management  . Osteoarthritis     Past Surgical History:  Procedure Laterality Date  . CHOLECYSTECTOMY  10/28/2011   Procedure: LAPAROSCOPIC CHOLECYSTECTOMY WITH INTRAOPERATIVE CHOLANGIOGRAM;  Surgeon: Earnstine Regal, MD;  Location: [facility];  Service: General;  Laterality: N/A;  . COLONOSCOPY WITH ESOPHAGOGASTRODUODENOSCOPY (EGD)  02/03/2013   with Propofol  . EXAM UNDER ANESTHESIA WITH MANIPULATION OF KNEE Right 05/30/2003  . EXAM UNDER ANESTHESIA WITH MANIPULATION OF KNEE Left 12/27/2002  . GANGLION CYST EXCISION Right    right wrist  . KNEE ARTHROSCOPY Bilateral 01/02/2000  . KNEE ARTHROSCOPY Right   . LAPAROSCOPIC VAGINAL HYSTERECTOMY WITH SALPINGO OOPHORECTOMY Bilateral 07/13/2006  . LIPOMA EXCISION Right 02/21/2008   hip  . MASS EXCISION  08/24/2012   Procedure: EXCISION MASS;   Surgeon: Earnstine Regal, MD;  Location: [facility];  Service: General;  Laterality: Left;  excisie soft tissue masses left shoulder(back) & left upper arm  . MASS EXCISION Left 01/24/2014   Procedure: EXCISION SOFT TISSUE MASSES LOWER LEFT BACK;  Surgeon: Earnstine Regal, MD;  Location: [facility];  Service: General;  Laterality: Left;  Marland Kitchen MASS EXCISION Right 04/30/2016   Procedure: EXCISION OF SKIN RIGHT CHEST WALL;  Surgeon: Autumn Messing III, MD;  Location: [facility];  Service: General;  Laterality: Right;  EXCISION OF SKIN RIGHT CHEST WALL  . MASTECTOMY W/ SENTINEL NODE BIOPSY Right 10/01/2015   Procedure: RIGHT MASTECTOMY WITH SENTINEL LYMPH NODE BIOPSY;  Surgeon: Autumn Messing III, MD;  Location: [facility];  Service: General;  Laterality: Right;  . PORT-A-CATH REMOVAL Left 04/30/2016   Procedure: REMOVAL PORT-A-CATH;  Surgeon: Autumn Messing III, MD;  Location: [facility];  Service: General;  Laterality: Left;  REMOVAL PORT-A-CATH  . PORTACATH PLACEMENT Left 11/01/2015   Procedure: INSERTION PORT-A-CATH;  Surgeon: Autumn Messing III, MD;  Location: [facility];  Service: General;  Laterality: Left;  . REPLACEMENT UNICONDYLAR JOINT KNEE Left 04/20/2001  . REVISION TOTAL KNEE ARTHROPLASTY Right 06/18/2004  . REVISION TOTAL KNEE ARTHROPLASTY Left 11/15/2002  . REVISION TOTAL KNEE ARTHROPLASTY Left 10/12/2001  . REVISION TOTAL KNEE ARTHROPLASTY Right 07/09/2015  . TONSILLECTOMY    . TOTAL KNEE ARTHROPLASTY Right 04/25/2003  . TOTAL KNEE ARTHROPLASTY  Left 06/25/2009    There were no vitals filed for this visit.       Subjective Assessment - 08/26/16 1402    Subjective Pt states she finished her breast cancer radiation at the end of October and had lots of skin disruption from it. She has right mastectomy with 20 lymph nodes removed.   She had 10 rounds of chemo and was supposed to have 12 rounds but had neurpathy  "I lost a of range of motion" in her  right arm    Pertinent History breast cancer with right  mastectomy with 20 lymph nodes removed, chemo with peripheral neuropathy  and radiation with skin disruption. Past history includes multiple levels of degenerative disc disease in neck and spine and 4 knee surgeries on each knee with the last one in October 2016    Patient Stated Goals to have right arm get stronger and move better    Currently in Pain? Yes   Pain Score 3   has pain meds    Pain Location Arm   Pain Orientation Right   Pain Descriptors / Indicators Aching;Sharp   Pain Type Chronic pain   Pain Radiating Towards toward chest and back    Pain Onset More than a month ago   Pain Frequency Constant   Aggravating Factors  get worse when she moves her arm alot.    Pain Relieving Factors pain meds help a little bit    Effect of Pain on Daily Activities has to get help with daily activities, can't get arm compfortable at night.             [facility] PT Assessment - 08/26/16 0001      Assessment   Medical Diagnosis breast cancer   Referring Provider [name]    Onset Date/Surgical Date 08/02/15   Hand Dominance Right   Prior Therapy none     Precautions   Precautions Other (comment)   Precaution Comments previous chemo and radiation      Restrictions   Weight Bearing Restrictions No     Balance Screen   Has the patient fallen in the past 6 months No   Has the patient had a decrease in activity level because of a fear of falling?  No   Is the patient reluctant to leave their home because of a fear of falling?  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    Quick DASH  52.57     Sensation   Light Touch Not tested  pt reports she neuropathy is getting better      Coordination   Gross Motor Movements are Fluid and Coordinated No  pt does not want to address neuropathy  symptoms at this time     Posture/Postural Control   Posture/Postural Control Postural limitations   Postural Limitations Rounded Shoulders;Forward head     AROM   Right Shoulder Extension 50 Degrees   Right Shoulder Flexion 110 Degrees  pulling at armpit    Right Shoulder ABduction 110 Degrees  painful    Right Shoulder Internal Rotation --  in supine 60   Right Shoulder External Rotation 57 Degrees  painful :in supine    Left Shoulder Flexion 160 Degrees   Left Shoulder ABduction 130 Degrees   Left Shoulder Internal Rotation 60 Degrees   Left Shoulder External Rotation 75 Degrees     Strength   Right Shoulder Flexion 3-/5  limited bu pain   Right Shoulder ABduction 3-/5  limited by pain    Left Shoulder Flexion 3/5   Left Shoulder ABduction 3/5     Palpation   Palpation comment decreased scar mobility at right chest mastectomy scar            LYMPHEDEMA/ONCOLOGY QUESTIONNAIRE - 08/26/16 1423      Right Upper Extremity Lymphedema   10 cm Proximal to Olecranon Process 40 cm   Olecranon Process 32.5 cm   15 cm Proximal to Ulnar Styloid Process 32.5 cm   Just Proximal to Ulnar Styloid Process 20.5 cm   Across Hand at [employer] 23.8 cm   At [place] of 2nd Digit 7.5 cm     Left Upper Extremity Lymphedema   10 cm Proximal to Olecranon Process 40 cm   Olecranon Process 32.5 cm   15 cm  Proximal to Ulnar Styloid Process 32.5 cm   Just Proximal to Ulnar Styloid Process 20.5 cm   Across Hand at [employer] 23.8 cm   At [place] of 2nd Digit 7.5 cm           Quick Dash - 08/26/16 0001    Open a tight or new jar Severe difficulty   Do heavy household chores (wash walls, wash floors) Mild difficulty   Carry a shopping bag or briefcase Mild difficulty   Wash your back Severe difficulty   Use a knife to cut food Mild difficulty   Recreational activities in which you take some force or impact through your arm, shoulder, or hand (golf, hammering, tennis) Moderate difficulty   During the past week, to what extent has your arm, shoulder or hand problem interfered with your normal social activities with family, friends, neighbors, or groups? Slightly   During the past week, to what extent has your arm, shoulder or hand problem limited your work or other regular daily activities Modererately   Arm, shoulder, or hand pain. Moderate   Tingling (pins and needles) in your arm, shoulder, or hand Extreme   Difficulty Sleeping Severe difficulty   DASH Score 52.27 %                             Long Term Clinic Goals - 08/26/16 1745      CC Long Term Goal  #1   Title Patient with verbalize an understanding of lymphedema risk reduction precautions   Time 4   Period Weeks   Status New     CC Long Term Goal  #2   Title Patient will be independent in a home exercise program   Time 4   Period Weeks   Status New     CC Long Term Goal  #  3   Title Patient will improve left shoulder abduction to 120 degrees with no pain  so that she can perform dressing tasks indpendently    Time 4   Period Weeks   Status New     CC Long Term Goal  #4   Title Patient will decrease the DASH score to <  38 to demonstrate increased functional use of upper extremity   Time 4   Period Weeks   Status New            Plan - 08/26/16 1738    Clinical Impression Statement 55  yo female with extensive couse of treamtent for right breast cancer with  complications from chemo and radiation with multiple medical problems presents with painful, stff right shoulder.  She does not have lymphedema at this time, but is at high risk with 20 lymph nodes removed.  [place] of these cormorbidiites and shoulder pain that is limiiting and evolving. this is a moderate comlexity eval    Rehab Potential Good   Clinical Impairments Affecting Rehab Potential 20 lymph nodes removed, previous radiation    PT Frequency 2x / week   PT Duration 4 weeks   PT Treatment/Interventions ADLs/Self Care Home Management;Patient/family education;Manual techniques;Scar mobilization;Passive range of motion;Neuromuscular re-education;Therapeutic activities;Therapeutic exercise   PT Next Visit Plan Begin shoulder ROM and active exercise, register for ABC class.  issue Training and development officer and Agree with Plan of Care Patient      Patient will benefit from skilled therapeutic intervention in order to improve the following deficits and impairments:  Decreased knowledge of precautions, Decreased scar mobility, Decreased strength, Obesity, Pain, Impaired UE functional use, Increased fascial restricitons, Postural dysfunction  Visit Diagnosis: Stiffness of right shoulder joint - Plan: PT plan of care cert/re-cert  Pain in right upper arm - Plan: PT plan of care cert/re-cert  Abnormal posture - Plan: PT plan of care cert/re-cert  Disorder of the skin and subcutaneous tissue related to radiation, unspecified - Plan: PT plan of care cert/re-cert      G-Codes - [id number]    Functional Assessment Tool Used Quick DASH    Functional Limitation Carrying, moving and handling objects   Carrying, Moving and Handling Objects Current Status [id number]) At least 40 percent but less than 60 percent impaired, limited or restricted   Carrying, Moving and Handling Objects Goal Status [id number]) At least 20 percent  but less than 40 percent impaired, limited or restricted       Problem List Patient Active Problem List   Diagnosis Date Noted  . Insomnia 07/16/2016  . Left groin pain 11/30/2015  . Severe obesity (BMI >= 40) ([place]) 10/26/2015  . Breast cancer of lower-inner quadrant of right female breast ([place]) 08/08/2015  . Diabetes mellitus without complication ([place]) [id number]  . Paraspinous mass, left 03/31/2013  . Internal hemorrhoid 02/17/2013  . Abdominal pain, other specified site 02/17/2013  . Neoplasm of soft tissue, left shoulder and left upper arm 08/03/2012  . Lightheadedness 01/29/2012  . Cholelithiasis with cholecystitis 10/08/2011  . Hypokalemia 09/17/2011  . GERD 08/08/2010  . GOUT 12/18/2008  . Essential hypertension 03/15/2007  . HEADACHE 03/15/2007   Donato Heinz. Owens Shark PT  [place] Levo 08/26/2016, 5:51 PM  [place], [place], [address] Phone: [phone]   Fax:  [phone]  Name: [name] MRN: [id number] Date of Birth: 1961/05/29

## 2016-08-28 ENCOUNTER — Ambulatory Visit: Payer: 59 | Admitting: Physical Therapy

## 2016-09-02 ENCOUNTER — Ambulatory Visit: Payer: 59 | Admitting: Physical Therapy

## 2016-09-09 ENCOUNTER — Ambulatory Visit: Payer: 59

## 2016-09-09 DIAGNOSIS — L599 Disorder of the skin and subcutaneous tissue related to radiation, unspecified: Secondary | ICD-10-CM

## 2016-09-09 DIAGNOSIS — M79621 Pain in right upper arm: Secondary | ICD-10-CM

## 2016-09-09 DIAGNOSIS — R293 Abnormal posture: Secondary | ICD-10-CM

## 2016-09-09 DIAGNOSIS — M25611 Stiffness of right shoulder, not elsewhere classified: Secondary | ICD-10-CM

## 2016-09-09 NOTE — Patient Instructions (Addendum)
SHOULDER: External Rotation - Supine (Cane)    Cancer Rehab [phone]  Hold cane with both hands. Rotate arm away from body. Keep elbow on floor and next to body. _10__ reps per set, _2-3__ sets per day. Add towel to keep elbow at side.  Cane Horizontal - Supine   With straight arms holding cane above shoulders, bring cane out to right, and then towards ear. Hold 5 seconds and then back to center Repeat _10__ times. Do _2-3__ times per day.  Cane Exercise: Flexion   Lie on back, holding cane above chest. Keeping arms as straight as possible, lower cane toward floor beyond head. Hold __5__ seconds. Repeat _10___ times. Do _2-3___ sessions per day.

## 2016-09-09 NOTE — Therapy (Signed)
[place], [place], [address] Phone: [phone]   Fax:  [phone]  Physical Therapy Treatment  Patient Details  Name: [name] MRN: [id number] Date of Birth: [birth year] Referring Provider: Mike Craze   Encounter Date: 09/09/2016      PT End of Session - 09/09/16 1105    Visit Number 2   Number of Visits 9   Date for PT Re-Evaluation 09/30/16   PT Start Time 1022   PT Stop Time 1104   PT Time Calculation (min) 42 min   Activity Tolerance Patient tolerated treatment well   Behavior During Therapy [facility] for tasks assessed/performed      Past Medical History:  Diagnosis Date  . Breast cancer ([place])   . Breast cancer of lower-inner quadrant of right female breast ([place]) 08/08/2015  . Colon polyps   . DDD (degenerative disc disease), cervical   . Degenerative joint disease   . Diabetes mellitus without complication ([place])   . Diverticulosis   . Gallstones   . GERD (gastroesophageal reflux disease)   . Headache(784.0)    migraine like per patient  . Hypertension   . Low back pain    dr phillips, pain management  . Osteoarthritis     Past Surgical History:  Procedure Laterality Date  . CHOLECYSTECTOMY  10/28/2011   Procedure: LAPAROSCOPIC CHOLECYSTECTOMY WITH INTRAOPERATIVE CHOLANGIOGRAM;  Surgeon: Earnstine Regal, MD;  [facility];  Service: General;  Laterality: N/A;  . COLONOSCOPY WITH ESOPHAGOGASTRODUODENOSCOPY (EGD)  02/03/2013   with Propofol  . EXAM UNDER ANESTHESIA WITH MANIPULATION OF KNEE Right 05/30/2003  . EXAM UNDER ANESTHESIA WITH MANIPULATION OF KNEE Left 12/27/2002  . GANGLION CYST EXCISION Right    right wrist  . KNEE ARTHROSCOPY Bilateral 01/02/2000  . KNEE ARTHROSCOPY Right   . LAPAROSCOPIC VAGINAL HYSTERECTOMY WITH SALPINGO OOPHORECTOMY Bilateral 07/13/2006  . LIPOMA EXCISION Right 02/21/2008   hip  . MASS EXCISION  08/24/2012   Procedure: EXCISION MASS;   Surgeon: Earnstine Regal, MD;  Location: [place];  Service: General;  Laterality: Left;  excisie soft tissue masses left shoulder(back) & left upper arm  . MASS EXCISION Left 01/24/2014   Procedure: EXCISION SOFT TISSUE MASSES LOWER LEFT BACK;  Surgeon: Earnstine Regal, MD;  Location: [place];  Service: General;  Laterality: Left;  Marland Kitchen MASS EXCISION Right 04/30/2016   Procedure: EXCISION OF SKIN RIGHT CHEST WALL;  Surgeon: Autumn Messing III, MD;  Location: [place];  Service: General;  Laterality: Right;  EXCISION OF SKIN RIGHT CHEST WALL  . MASTECTOMY W/ SENTINEL NODE BIOPSY Right 10/01/2015   Procedure: RIGHT MASTECTOMY WITH SENTINEL LYMPH NODE BIOPSY;  Surgeon: Autumn Messing III, MD;  Location: [place];  Service: General;  Laterality: Right;  . PORT-A-CATH REMOVAL Left 04/30/2016   Procedure: REMOVAL PORT-A-CATH;  Surgeon: Autumn Messing III, MD;  Location: [place];  Service: General;  Laterality: Left;  REMOVAL PORT-A-CATH  . PORTACATH PLACEMENT Left 11/01/2015   Procedure: INSERTION PORT-A-CATH;  Surgeon: Autumn Messing III, MD;  Location: [place];  Service: General;  Laterality: Left;  . REPLACEMENT UNICONDYLAR JOINT KNEE Left 04/20/2001  . REVISION TOTAL KNEE ARTHROPLASTY Right 06/18/2004  . REVISION TOTAL KNEE ARTHROPLASTY Left 11/15/2002  . REVISION TOTAL KNEE ARTHROPLASTY Left 10/12/2001  . REVISION TOTAL KNEE ARTHROPLASTY Right 07/09/2015  . TONSILLECTOMY    . TOTAL KNEE ARTHROPLASTY Right 04/25/2003  . TOTAL KNEE ARTHROPLASTY  Left 06/25/2009    There were no vitals filed for this visit.      Subjective Assessment - 09/09/16 1031    Subjective My pain isn't quite so bad today as it was at my first visit. But I started anastrazole and I can tell that my jionts.bones are hurting more now. Both my shoulders are hurting today but I know its from the medicine.    Pertinent History breast cancer with right  mastectomy with 20 lymph  nodes removed, chemo with peripheral neuropathy  and radiation with skin disruption. Past history includes multiple levels of degenerative disc disease in neck and spine and 4 knee surgeries on each knee with the last one in October 2016    Patient Stated Goals to have right arm get stronger and move better    Currently in Pain? Yes   Pain Score 4    Pain Location Shoulder   Pain Orientation Right   Pain Descriptors / Indicators Aching   Pain Type Chronic pain   Pain Onset More than a month ago   Pain Frequency Intermittent   Aggravating Factors  anastrazole   Pain Relieving Factors pain meds help some                         [facility] Adult PT Treatment/Exercise - 09/09/16 0001      Shoulder Exercises: Seated   External Rotation AAROM;Right;5 reps  5 second holds with cane   Flexion AAROM;Right;5 reps  5 second holds with cane   Abduction AAROM;Right;5 reps  5 second holds   Other Seated Exercises Also tried same exercises in supine which pt tolerated better.     Shoulder Exercises: Pulleys   Flexion 2 minutes   Flexion Limitations VC to decrease scapular compensations   ABduction 2 minutes   ABduction Limitations VC to decrease scapular compensations     Shoulder Exercises: ROM/Strengthening   Other ROM/Strengthening Exercises UE Ranger for Rt shoulder flexion 10 times and abduction 5 times with VC to decrease scapular compensations.     Manual Therapy   Passive ROM In Supine to Rt shoulder into flexion, abduction and er to pts tolerance.                PT Education - 09/09/16 1047    Education provided Yes   Education Details Supine cane exercises   Person(s) Educated Patient   Methods Explanation;Demonstration;Handout   Comprehension Verbalized understanding;Returned demonstration;Need further instruction                [facility] Goals - 08/26/16 1745      CC Long Term Goal  #1   Title Patient with verbalize an understanding  of lymphedema risk reduction precautions   Time 4   Period Weeks   Status New     CC Long Term Goal  #2   Title Patient will be independent in a home exercise program   Time 4   Period Weeks   Status New     CC Long Term Goal  #3   Title Patient will improve left shoulder abduction to 120 degrees with no pain  so that she can perform dressing tasks indpendently    Baseline 110 on 08/26/2016   Time 4   Period Weeks   Status New     CC Long Term Goal  #4   Title Patient will decrease the DASH score to <  38 to demonstrate increased functional  use of upper extremity   Time 4   Period Weeks   Status New            Plan - 09/09/16 1112    Clinical Impression Statement Pt did well with exercises today and was able to feel a good stretch without increasing pain. Also did well with decreasing her scapular compensations with AA/ROM exercises and relaxing during P/ROM. Pt demonstrated good technique with HEP issued today as well. She reported feeling good, looser after session.   Rehab Potential Good   Clinical Impairments Affecting Rehab Potential 20 lymph nodes removed, previous radiation    PT Frequency 2x / week   PT Duration 4 weeks   PT Treatment/Interventions ADLs/Self Care Home Management;Patient/family education;Manual techniques;Scar mobilization;Passive range of motion;Neuromuscular re-education;Therapeutic activities;Therapeutic exercise   PT Next Visit Plan Cont shoulder ROM and active exercise, review HEP.    Recommended Other Services Pt signed up for ABC class and issued [employer] flyer to her today.    Consulted and Agree with Plan of Care Patient      Patient will benefit from skilled therapeutic intervention in order to improve the following deficits and impairments:  Decreased knowledge of precautions, Decreased scar mobility, Decreased strength, Obesity, Pain, Impaired UE functional use, Increased fascial restricitons, Postural dysfunction  Visit  Diagnosis: Stiffness of right shoulder joint  Pain in right upper arm  Abnormal posture  Disorder of the skin and subcutaneous tissue related to radiation, unspecified     Problem List Patient Active Problem List   Diagnosis Date Noted  . Insomnia 07/16/2016  . Left groin pain 11/30/2015  . Severe obesity (BMI >= 40) ([place]) 10/26/2015  . Breast cancer of lower-inner quadrant of right female breast ([place]) 08/08/2015  . Diabetes mellitus without complication ([place]) [id number]  . Paraspinous mass, left 03/31/2013  . Internal hemorrhoid 02/17/2013  . Abdominal pain, other specified site 02/17/2013  . Neoplasm of soft tissue, left shoulder and left upper arm 08/03/2012  . Lightheadedness 01/29/2012  . Cholelithiasis with cholecystitis 10/08/2011  . Hypokalemia 09/17/2011  . GERD 08/08/2010  . GOUT 12/18/2008  . Essential hypertension 03/15/2007  . HEADACHE 03/15/2007    Otelia Limes, PTA 09/09/2016, 11:20 AM  [place], [place], [address] Phone: [phone]   Fax:  [phone]  Name: COLTON REVELS MRN: [id number] Date of Birth: Jan 25, 1961

## 2016-09-11 ENCOUNTER — Ambulatory Visit: Payer: 59

## 2016-09-17 ENCOUNTER — Ambulatory Visit: Payer: 59 | Admitting: Physical Therapy

## 2016-09-17 ENCOUNTER — Telehealth: Payer: Self-pay | Admitting: *Deleted

## 2016-09-17 ENCOUNTER — Other Ambulatory Visit: Payer: Self-pay | Admitting: Family Medicine

## 2016-09-17 NOTE — Telephone Encounter (Signed)
This RN spoke with pt post prior call - pt had no further needs.

## 2016-09-17 NOTE — Telephone Encounter (Signed)
Voicemail: "I'm a patient of Dr. Jana Hakim.  Having a lot of bone pain.  Call me [phone]." "I started anastrozole the first of the month.  I don't know if it's a cold or the anastrozole.  Guest in and out of my home for the holidays that were sick.  My back, knees and groin areas hurt.  In the morning my fingers ache and they ease as the day progresses.  I have to wait before I can take off moving but once I get started, I'm on my way.  Pain started getting worse about noon yesterday.  Head is starting to feel kind of heavy.  Also a dry cough, nose running quite often.  No fever.  I've had a little ache in these areas but more severe since yesterday.  I take methadone and oxycodone for pain.    I called my PCP and was told to call you all.  I have high blood pressure so I can't take just anything over the counter."  Advised FF, try throat lozenge for cough.  Ask her local retail pharmacist what is available OTC for high blood pressure patients to use for cold symptoms.  Continue pain medicines as ordered.  Monitor and call any changes as this could be a combination of the two.  Will call if any new orders.

## 2016-09-23 ENCOUNTER — Ambulatory Visit: Payer: 59 | Attending: Adult Health | Admitting: Physical Therapy

## 2016-09-23 DIAGNOSIS — M6281 Muscle weakness (generalized): Secondary | ICD-10-CM | POA: Diagnosis present

## 2016-09-23 DIAGNOSIS — R293 Abnormal posture: Secondary | ICD-10-CM | POA: Insufficient documentation

## 2016-09-23 DIAGNOSIS — M25611 Stiffness of right shoulder, not elsewhere classified: Secondary | ICD-10-CM | POA: Insufficient documentation

## 2016-09-23 DIAGNOSIS — M79621 Pain in right upper arm: Secondary | ICD-10-CM | POA: Diagnosis not present

## 2016-09-23 DIAGNOSIS — L599 Disorder of the skin and subcutaneous tissue related to radiation, unspecified: Secondary | ICD-10-CM | POA: Diagnosis present

## 2016-09-23 NOTE — Patient Instructions (Signed)
Over Head Pull: Narrow Grip And Wide Grip       On back, knees bent, feet flat, band across thighs, elbows straight but relaxed. Pull hands apart (start). Keeping elbows straight, bring arms up and over head, hands toward floor. Keep pull steady on band. Hold momentarily. Return slowly, keeping pull steady, back to start. Repeat _5-10__ times. Band color _yellow_____  Side Pull: Double Arm   On back, knees bent, feet flat. Arms perpendicular to body, shoulder level, elbows straight but relaxed. Pull arms out to sides, elbows straight. Resistance band comes across collarbones, hands toward floor. Hold momentarily. Slowly return to starting position. Repeat _5-10__ times. Band color _yellow____   Sash   On back, knees bent, feet flat, left hand on left hip, right hand above left. Pull right arm DIAGONALLY (hip to shoulder) across chest. Bring right arm along head toward floor. Hold momentarily. Slowly return to starting position. Repeat _5-10__ times. Do with left arm. Band color yellow______   Shoulder Rotation: Double Arm   On back, knees bent, feet flat, elbows tucked at sides, bent 90, hands palms up. Pull hands apart and down toward floor, keeping elbows near sides. Hold momentarily. Slowly return to starting position. Repeat 5-10__ times. Band color yellow______

## 2016-09-23 NOTE — Therapy (Signed)
[place], [place], [address] Phone: [phone]   Fax:  [phone]  Physical Therapy Treatment  Patient Details  Name: [name] MRN: [id number] Date of Birth: 30-Sep-1960 Referring Provider: Mike Craze   Encounter Date: 09/23/2016      PT End of Session - 09/23/16 1700    Visit Number 3   Number of Visits 9   Date for PT Re-Evaluation 09/30/16   PT Start Time 1518   PT Stop Time 1600   PT Time Calculation (min) 42 min   Activity Tolerance Patient tolerated treatment well   Behavior During Therapy [facility] for tasks assessed/performed      Past Medical History:  Diagnosis Date  . Breast cancer ([place])   . Breast cancer of lower-inner quadrant of right female breast ([place]) 08/08/2015  . Colon polyps   . DDD (degenerative disc disease), cervical   . Degenerative joint disease   . Diabetes mellitus without complication ([place])   . Diverticulosis   . Gallstones   . GERD (gastroesophageal reflux disease)   . Headache(784.0)    migraine like per patient  . Hypertension   . Low back pain    dr phillips, pain management  . Osteoarthritis     Past Surgical History:  Procedure Laterality Date  . CHOLECYSTECTOMY  10/28/2011   Procedure: LAPAROSCOPIC CHOLECYSTECTOMY WITH INTRAOPERATIVE CHOLANGIOGRAM;  Surgeon: Earnstine Regal, MD;  [facility];  Service: General;  Laterality: N/A;  . COLONOSCOPY WITH ESOPHAGOGASTRODUODENOSCOPY (EGD)  02/03/2013   with Propofol  . EXAM UNDER ANESTHESIA WITH MANIPULATION OF KNEE Right 05/30/2003  . EXAM UNDER ANESTHESIA WITH MANIPULATION OF KNEE Left 12/27/2002  . GANGLION CYST EXCISION Right    right wrist  . KNEE ARTHROSCOPY Bilateral 01/02/2000  . KNEE ARTHROSCOPY Right   . LAPAROSCOPIC VAGINAL HYSTERECTOMY WITH SALPINGO OOPHORECTOMY Bilateral 07/13/2006  . LIPOMA EXCISION Right 02/21/2008   hip  . MASS EXCISION  08/24/2012   Procedure: EXCISION MASS;   Surgeon: Earnstine Regal, MD;  Location: [place];  Service: General;  Laterality: Left;  excisie soft tissue masses left shoulder(back) & left upper arm  . MASS EXCISION Left 01/24/2014   Procedure: EXCISION SOFT TISSUE MASSES LOWER LEFT BACK;  Surgeon: Earnstine Regal, MD;  Location: [place];  Service: General;  Laterality: Left;  Marland Kitchen MASS EXCISION Right 04/30/2016   Procedure: EXCISION OF SKIN RIGHT CHEST WALL;  Surgeon: Autumn Messing III, MD;  Location: [place];  Service: General;  Laterality: Right;  EXCISION OF SKIN RIGHT CHEST WALL  . MASTECTOMY W/ SENTINEL NODE BIOPSY Right 10/01/2015   Procedure: RIGHT MASTECTOMY WITH SENTINEL LYMPH NODE BIOPSY;  Surgeon: Autumn Messing III, MD;  Location: [place];  Service: General;  Laterality: Right;  . PORT-A-CATH REMOVAL Left 04/30/2016   Procedure: REMOVAL PORT-A-CATH;  Surgeon: Autumn Messing III, MD;  Location: [place];  Service: General;  Laterality: Left;  REMOVAL PORT-A-CATH  . PORTACATH PLACEMENT Left 11/01/2015   Procedure: INSERTION PORT-A-CATH;  Surgeon: Autumn Messing III, MD;  Location: [place];  Service: General;  Laterality: Left;  . REPLACEMENT UNICONDYLAR JOINT KNEE Left 04/20/2001  . REVISION TOTAL KNEE ARTHROPLASTY Right 06/18/2004  . REVISION TOTAL KNEE ARTHROPLASTY Left 11/15/2002  . REVISION TOTAL KNEE ARTHROPLASTY Left 10/12/2001  . REVISION TOTAL KNEE ARTHROPLASTY Right 07/09/2015  . TONSILLECTOMY    . TOTAL KNEE ARTHROPLASTY Right 04/25/2003  . TOTAL KNEE ARTHROPLASTY  Left 06/25/2009    There were no vitals filed for this visit.      Subjective Assessment - 09/23/16 1524    Subjective Pt says she noticed her sleeve was tight on her right arm this past Sunday. She has not had a weight gain  She has been having some pain in right arm when she does house work    Pertinent History breast cancer with right  mastectomy with 20 lymph nodes removed in january,  chemo with  peripheral neuropathy  and  had to have more skin removed before radiation radiation with skin disruption which was concluded last of October.  Past history includes multiple levels of degenerative disc disease in neck and spine and 4 knee surgeries on each knee with the last one in October 2016    Patient Stated Goals to have right arm get stronger and move better    Currently in Pain? Yes   Pain Score 4    Pain Location Shoulder   Pain Orientation Right   Pain Descriptors / Indicators Aching   Pain Type Chronic pain               LYMPHEDEMA/ONCOLOGY QUESTIONNAIRE - 09/23/16 1539      Right Upper Extremity Lymphedema   10 cm Proximal to Olecranon Process 39 cm   Olecranon Process 32 cm   15 cm Proximal to Ulnar Styloid Process 32 cm   Just Proximal to Ulnar Styloid Process 20.8 cm   Across Hand at [employer] 23.8 cm   At [place] of 2nd Digit 7.5 cm                  [facility] Adult PT Treatment/Exercise - 09/23/16 0001      Self-Care   Self-Care Other Self-Care Comments   Other Self-Care Comments  provided foam pad to right lateral trunk to wear inside bra     Shoulder Exercises: Supine   Horizontal ABduction Strengthening;Both;5 reps;Theraband   Theraband Level (Shoulder Horizontal ABduction) Level 1 (Yellow)   External Rotation Strengthening;Both;5 reps;Theraband   Theraband Level (Shoulder External Rotation) Level 1 (Yellow)   Flexion Strengthening;Both;5 reps;Theraband  wide and narrow grip    Theraband Level (Shoulder Flexion) Level 1 (Yellow)   Other Supine Exercises diagonal elevation with yellow theraband x 5 reps      Shoulder Exercises: Pulleys   Flexion 2 minutes   ABduction 2 minutes     Shoulder Exercises: ROM/Strengthening   Other ROM/Strengthening Exercises UE Ranger for Rt shoulder flexion 10 times and abduction 5 times with VC to decrease scapular compensations.                        [facility] Goals - 09/23/16 1704       CC Long Term Goal  #1   Title Patient with verbalize an understanding of lymphedema risk reduction precautions   Status On-going     CC Long Term Goal  #2   Title Patient will be independent in a home exercise program   Status On-going     CC Long Term Goal  #3   Title Patient will improve left shoulder abduction to 120 degrees with no pain  so that she can perform dressing tasks indpendently    Baseline 110 on 08/26/2016   Status On-going     CC Long Term Goal  #4   Title Patient will decrease the DASH score to <  38 to demonstrate  increased functional use of upper extremity   Status On-going            Plan - 09/23/16 1700    Clinical Impression Statement Pt did not have increase in right arm to measurement, but states that she felt pulling in wearing her clothes, She may have had an increase in lateral trunk and back that is not easily measured. Pt continues to have pain in joint with movement,. Upgraded exercise program to include supine scapular series with yelllow theraband which she could do without pain.    Rehab Potential Good   Clinical Impairments Affecting Rehab Potential 20 lymph nodes removed, previous radiation    PT Duration 4 weeks   PT Next Visit Plan Assess effect of foam at lateral trunk . consider Tg soft for arm if she continues to have pain. assess effect of supine scapular series and progress with exercise for ROM and strengthening as able     Consulted and Agree with Plan of Care Patient      Patient will benefit from skilled therapeutic intervention in order to improve the following deficits and impairments:  Decreased knowledge of precautions, Decreased scar mobility, Decreased strength, Obesity, Pain, Impaired UE functional use, Increased fascial restricitons, Postural dysfunction  Visit Diagnosis: Stiffness of right shoulder joint  Pain in right upper arm  Abnormal posture  Disorder of the skin and subcutaneous tissue related to radiation,  unspecified     Problem List Patient Active Problem List   Diagnosis Date Noted  . Insomnia 07/16/2016  . Left groin pain 11/30/2015  . Severe obesity (BMI >= 40) ([place]) 10/26/2015  . Breast cancer of lower-inner quadrant of right female breast ([place]) 08/08/2015  . Diabetes mellitus without complication ([place]) [id number]  . Paraspinous mass, left 03/31/2013  . Internal hemorrhoid 02/17/2013  . Abdominal pain, other specified site 02/17/2013  . Neoplasm of soft tissue, left shoulder and left upper arm 08/03/2012  . Lightheadedness 01/29/2012  . Cholelithiasis with cholecystitis 10/08/2011  . Hypokalemia 09/17/2011  . GERD 08/08/2010  . GOUT 12/18/2008  . Essential hypertension 03/15/2007  . HEADACHE 03/15/2007   Donato Heinz. Owens Shark PT  Norwood Levo 09/23/2016, 5:05 PM  [place], [place], [address] Phone: [phone]   Fax:  [phone]  Name: DAFFANY TOBIN MRN: [id number] Date of Birth: 05/20/1961

## 2016-09-25 ENCOUNTER — Ambulatory Visit: Payer: 59 | Admitting: Physical Therapy

## 2016-09-25 DIAGNOSIS — L599 Disorder of the skin and subcutaneous tissue related to radiation, unspecified: Secondary | ICD-10-CM

## 2016-09-25 DIAGNOSIS — M25611 Stiffness of right shoulder, not elsewhere classified: Secondary | ICD-10-CM | POA: Diagnosis not present

## 2016-09-25 DIAGNOSIS — R293 Abnormal posture: Secondary | ICD-10-CM

## 2016-09-25 DIAGNOSIS — M79621 Pain in right upper arm: Secondary | ICD-10-CM

## 2016-09-25 NOTE — Therapy (Signed)
[place], [place], [address] Phone: [phone]   Fax:  [phone]  Physical Therapy Treatment  Patient Details  Name: [name] MRN: [id number] Date of Birth: [birth year] Referring Provider: Mike Craze   Encounter Date: 09/25/2016      PT End of Session - 09/25/16 1713    Visit Number 4   Number of Visits 9   Date for PT Re-Evaluation 09/30/16   PT Start Time 5056   PT Stop Time 1620   PT Time Calculation (min) 40 min   Activity Tolerance Patient tolerated treatment well   Behavior During Therapy [facility] for tasks assessed/performed      Past Medical History:  Diagnosis Date  . Breast cancer ([place])   . Breast cancer of lower-inner quadrant of right female breast ([place]) 08/08/2015  . Colon polyps   . DDD (degenerative disc disease), cervical   . Degenerative joint disease   . Diabetes mellitus without complication ([place])   . Diverticulosis   . Gallstones   . GERD (gastroesophageal reflux disease)   . Headache(784.0)    migraine like per patient  . Hypertension   . Low back pain    dr phillips, pain management  . Osteoarthritis     Past Surgical History:  Procedure Laterality Date  . CHOLECYSTECTOMY  10/28/2011   Procedure: LAPAROSCOPIC CHOLECYSTECTOMY WITH INTRAOPERATIVE CHOLANGIOGRAM;  Surgeon: Earnstine Regal, MD;  [facility];  Service: General;  Laterality: N/A;  . COLONOSCOPY WITH ESOPHAGOGASTRODUODENOSCOPY (EGD)  02/03/2013   with Propofol  . EXAM UNDER ANESTHESIA WITH MANIPULATION OF KNEE Right 05/30/2003  . EXAM UNDER ANESTHESIA WITH MANIPULATION OF KNEE Left 12/27/2002  . GANGLION CYST EXCISION Right    right wrist  . KNEE ARTHROSCOPY Bilateral 01/02/2000  . KNEE ARTHROSCOPY Right   . LAPAROSCOPIC VAGINAL HYSTERECTOMY WITH SALPINGO OOPHORECTOMY Bilateral 07/13/2006  . LIPOMA EXCISION Right 02/21/2008   hip  . MASS EXCISION  08/24/2012   Procedure: EXCISION MASS;   Surgeon: Earnstine Regal, MD;  Location: [place];  Service: General;  Laterality: Left;  excisie soft tissue masses left shoulder(back) & left upper arm  . MASS EXCISION Left 01/24/2014   Procedure: EXCISION SOFT TISSUE MASSES LOWER LEFT BACK;  Surgeon: Earnstine Regal, MD;  Location: [employer];  Service: General;  Laterality: Left;  Marland Kitchen MASS EXCISION Right 04/30/2016   Procedure: EXCISION OF SKIN RIGHT CHEST WALL;  Surgeon: Autumn Messing III, MD;  Location: [place];  Service: General;  Laterality: Right;  EXCISION OF SKIN RIGHT CHEST WALL  . MASTECTOMY W/ SENTINEL NODE BIOPSY Right 10/01/2015   Procedure: RIGHT MASTECTOMY WITH SENTINEL LYMPH NODE BIOPSY;  Surgeon: Autumn Messing III, MD;  Location: [place];  Service: General;  Laterality: Right;  . PORT-A-CATH REMOVAL Left 04/30/2016   Procedure: REMOVAL PORT-A-CATH;  Surgeon: Autumn Messing III, MD;  Location: [place];  Service: General;  Laterality: Left;  REMOVAL PORT-A-CATH  . PORTACATH PLACEMENT Left 11/01/2015   Procedure: INSERTION PORT-A-CATH;  Surgeon: Autumn Messing III, MD;  Location: [place];  Service: General;  Laterality: Left;  . REPLACEMENT UNICONDYLAR JOINT KNEE Left 04/20/2001  . REVISION TOTAL KNEE ARTHROPLASTY Right 06/18/2004  . REVISION TOTAL KNEE ARTHROPLASTY Left 11/15/2002  . REVISION TOTAL KNEE ARTHROPLASTY Left 10/12/2001  . REVISION TOTAL KNEE ARTHROPLASTY Right 07/09/2015  . TONSILLECTOMY    . TOTAL KNEE ARTHROPLASTY Right 04/25/2003  . TOTAL KNEE ARTHROPLASTY  Left 06/25/2009    There were no vitals filed for this visit.      Subjective Assessment - 09/25/16 1701    Subjective Pt continues to have pain in multiple joints.  She describes arthritis "all over" and "all down her spine" She reports aching over her right shoulder and pain at right anterior axilla/chest    Pertinent History breast cancer with right  mastectomy with 20 lymph nodes removed in january,   chemo with peripheral neuropathy  and  had to have more skin removed before radiation radiation with skin disruption which was concluded last of October.  Past history includes multiple levels of degenerative disc disease in neck and spine and 4 knee surgeries on each knee with the last one in October 2016    Patient Stated Goals to have right arm get stronger and move better    Currently in Pain? Yes   Pain Score 4    Pain Location Shoulder   Pain Orientation Right   Pain Descriptors / Indicators Aching   Pain Type Chronic pain   Pain Radiating Towards toward chest and back    Pain Onset More than a month ago   Pain Frequency Intermittent   Aggravating Factors  anastrozole   Pain Relieving Factors pain meds    Effect of Pain on Daily Activities needs help with activities             [facility] PT Assessment - 09/25/16 0001      AROM   Right Shoulder ABduction 128 Degrees                     [facility] Adult PT Treatment/Exercise - 09/25/16 0001      Self-Care   Self-Care Other Self-Care Comments   Other Self-Care Comments  gave patient a flyer for amazing breast forms since she feels that her prosthesis is too heavy and doesn't fit right  reviewed lymphedema risk reduction practices and pt acknowledged them      Lumbar Exercises: Standing   Other Standing Lumbar Exercises standing weight shifts and mini squats,      Shoulder Exercises: Sidelying   External Rotation AROM;Right;12 reps  5 reps of isometric strengthening      Shoulder Exercises: Standing   External Rotation AROM;Both;5 reps   Flexion AROM;Both;5 reps  shoulder width apart and wide width apart    Extension Strengthening;Right;Left;5 reps;Theraband   Theraband Level (Shoulder Extension) Level 2 (Red)   Retraction AROM;Both;5 reps     Manual Therapy   Passive ROM In Supine to Rt shoulder into flexion, abduction and er to pts tolerance.                PT Education - 09/25/16 1713    Education  provided Yes   Education Details lymphedema risk reduction    Person(s) Educated Patient   Methods Explanation;Demonstration   Comprehension Verbalized understanding                [facility] Goals - 09/25/16 1607      CC Long Term Goal  #1   Title Patient with verbalize an understanding of lymphedema risk reduction precautions   Status Achieved     CC Long Term Goal  #2   Title Patient will be independent in a home exercise program   Status On-going     CC Long Term Goal  #3   Title Patient will improve left shoulder abduction to 120 degrees with no pain  so that she can perform dressing tasks indpendently    Baseline 110 on 08/26/2016, 128 on 09/26/2015   Time 4   Status Achieved     CC Long Term Goal  #4   Title Patient will decrease the DASH score to <  38 to demonstrate increased functional use of upper extremity   Status On-going            Plan - 09/25/16 1714    Clinical Impression Statement Pt has met the goal for lymphedema risk reduction but still would like to attend ABC class for reinforcement. She has acheived preliminary ROM goals, but still is having pain in right shoulder.  She is interested in learning a pregressive weight strengthening program to decrease her risk of developing lymphedema    Rehab Potential Good   Clinical Impairments Affecting Rehab Potential 20 lymph nodes removed, previous radiation    PT Frequency 2x / week   PT Treatment/Interventions ADLs/Self Care Home Management;Patient/family education;Manual techniques;Scar mobilization;Passive range of motion;Neuromuscular re-education;Therapeutic activities;Therapeutic exercise   PT Next Visit Plan Give ABC Class flyer and schedule.  Give breast cancer exercise flyer for Tai Chi class. Begin teaching Strength ABC program  Remeasure arm PRN if pain continues and consider tg soft.  arrange for pt to get measured for a sleeve.    Consulted and Agree with Plan of Care Patient       Patient will benefit from skilled therapeutic intervention in order to improve the following deficits and impairments:  Decreased knowledge of precautions, Decreased scar mobility, Decreased strength, Obesity, Pain, Impaired UE functional use, Increased fascial restricitons, Postural dysfunction  Visit Diagnosis: Stiffness of right shoulder joint  Pain in right upper arm  Abnormal posture  Disorder of the skin and subcutaneous tissue related to radiation, unspecified     Problem List Patient Active Problem List   Diagnosis Date Noted  . Insomnia 07/16/2016  . Left groin pain 11/30/2015  . Severe obesity (BMI >= 40) ([place]) 10/26/2015  . Breast cancer of lower-inner quadrant of right female breast ([place]) 08/08/2015  . Diabetes mellitus without complication ([place]) 15/37/9432  . Paraspinous mass, left 03/31/2013  . Internal hemorrhoid 02/17/2013  . Abdominal pain, other specified site 02/17/2013  . Neoplasm of soft tissue, left shoulder and left upper arm 08/03/2012  . Lightheadedness 01/29/2012  . Cholelithiasis with cholecystitis 10/08/2011  . Hypokalemia 09/17/2011  . GERD 08/08/2010  . GOUT 12/18/2008  . Essential hypertension 03/15/2007  . HEADACHE 03/15/2007   Donato Heinz. Owens Shark PT  Norwood Levo 09/25/2016, 5:19 PM  [place], [place], [address] Phone: [phone]   Fax:  [phone]  Name: [name] MRN: [id number] Date of Birth: 12-May-1961

## 2016-09-30 ENCOUNTER — Other Ambulatory Visit: Payer: Self-pay | Admitting: Family Medicine

## 2016-10-01 ENCOUNTER — Ambulatory Visit: Payer: 59

## 2016-10-01 DIAGNOSIS — M79621 Pain in right upper arm: Secondary | ICD-10-CM

## 2016-10-01 DIAGNOSIS — L599 Disorder of the skin and subcutaneous tissue related to radiation, unspecified: Secondary | ICD-10-CM

## 2016-10-01 DIAGNOSIS — R293 Abnormal posture: Secondary | ICD-10-CM

## 2016-10-01 DIAGNOSIS — M25611 Stiffness of right shoulder, not elsewhere classified: Secondary | ICD-10-CM

## 2016-10-01 NOTE — Therapy (Signed)
[place], [place], [address] Phone: [phone]   Fax:  [phone]  Physical Therapy Treatment  Patient Details  Name: [name] MRN: [id number] Date of Birth: [birth year] Referring Provider: Mike Craze   Encounter Date: 10/01/2016      PT End of Session - 10/01/16 1342    Visit Number 5   Number of Visits 9   Date for PT Re-Evaluation 09/30/16   PT Start Time 1309   PT Stop Time 1353   PT Time Calculation (min) 44 min   Activity Tolerance Patient tolerated treatment well   Behavior During Therapy [facility] for tasks assessed/performed      Past Medical History:  Diagnosis Date  . Breast cancer ([place])   . Breast cancer of lower-inner quadrant of right female breast ([place]) 08/08/2015  . Colon polyps   . DDD (degenerative disc disease), cervical   . Degenerative joint disease   . Diabetes mellitus without complication ([place])   . Diverticulosis   . Gallstones   . GERD (gastroesophageal reflux disease)   . Headache(784.0)    migraine like per patient  . Hypertension   . Low back pain    dr phillips, pain management  . Osteoarthritis     Past Surgical History:  Procedure Laterality Date  . CHOLECYSTECTOMY  10/28/2011   Procedure: LAPAROSCOPIC CHOLECYSTECTOMY WITH INTRAOPERATIVE CHOLANGIOGRAM;  Surgeon: Earnstine Regal, MD;  [facility];  Service: General;  Laterality: N/A;  . COLONOSCOPY WITH ESOPHAGOGASTRODUODENOSCOPY (EGD)  02/03/2013   with Propofol  . EXAM UNDER ANESTHESIA WITH MANIPULATION OF KNEE Right 05/30/2003  . EXAM UNDER ANESTHESIA WITH MANIPULATION OF KNEE Left 12/27/2002  . GANGLION CYST EXCISION Right    right wrist  . KNEE ARTHROSCOPY Bilateral 01/02/2000  . KNEE ARTHROSCOPY Right   . LAPAROSCOPIC VAGINAL HYSTERECTOMY WITH SALPINGO OOPHORECTOMY Bilateral 07/13/2006  . LIPOMA EXCISION Right 02/21/2008   hip  . MASS EXCISION  08/24/2012   Procedure: EXCISION MASS;   Surgeon: Earnstine Regal, MD;  Location: [place];  Service: General;  Laterality: Left;  excisie soft tissue masses left shoulder(back) & left upper arm  . MASS EXCISION Left 01/24/2014   Procedure: EXCISION SOFT TISSUE MASSES LOWER LEFT BACK;  Surgeon: Earnstine Regal, MD;  Location: [place];  Service: General;  Laterality: Left;  Marland Kitchen MASS EXCISION Right 04/30/2016   Procedure: EXCISION OF SKIN RIGHT CHEST WALL;  Surgeon: Autumn Messing III, MD;  Location: [place];  Service: General;  Laterality: Right;  EXCISION OF SKIN RIGHT CHEST WALL  . MASTECTOMY W/ SENTINEL NODE BIOPSY Right 10/01/2015   Procedure: RIGHT MASTECTOMY WITH SENTINEL LYMPH NODE BIOPSY;  Surgeon: Autumn Messing III, MD;  Location: [place];  Service: General;  Laterality: Right;  . PORT-A-CATH REMOVAL Left 04/30/2016   Procedure: REMOVAL PORT-A-CATH;  Surgeon: Autumn Messing III, MD;  Location: [place];  Service: General;  Laterality: Left;  REMOVAL PORT-A-CATH  . PORTACATH PLACEMENT Left 11/01/2015   Procedure: INSERTION PORT-A-CATH;  Surgeon: Autumn Messing III, MD;  Location: [place];  Service: General;  Laterality: Left;  . REPLACEMENT UNICONDYLAR JOINT KNEE Left 04/20/2001  . REVISION TOTAL KNEE ARTHROPLASTY Right 06/18/2004  . REVISION TOTAL KNEE ARTHROPLASTY Left 11/15/2002  . REVISION TOTAL KNEE ARTHROPLASTY Left 10/12/2001  . REVISION TOTAL KNEE ARTHROPLASTY Right 07/09/2015  . TONSILLECTOMY    . TOTAL KNEE ARTHROPLASTY Right 04/25/2003  . TOTAL KNEE ARTHROPLASTY  Left 06/25/2009    There were no vitals filed for this visit.      Subjective Assessment - 10/01/16 1311    Subjective Nothing new from last visit, bones still achy. MY Rt shoulder isn't hurting today, it just feels sore.    Pertinent History breast cancer with right  mastectomy with 20 lymph nodes removed in january,  chemo with peripheral neuropathy  and  had to have more skin removed before radiation  radiation with skin disruption which was concluded last of October.  Past history includes multiple levels of degenerative disc disease in neck and spine and 4 knee surgeries on each knee with the last one in October 2016    Patient Stated Goals to have right arm get stronger and move better    Currently in Pain? No/denies                         [facility] Adult PT Treatment/Exercise - 10/01/16 0001      Shoulder Exercises: ROM/Strengthening   Other ROM/Strengthening Exercises All Stretching exercises form Strength ABC Program/Packet 1 time each for 15 seconds except butterfly or piriformis as pt is unable due to multiple knee replacements.    Other ROM/Strengthening Exercises All Strengthening exercises from Strength ABC Program/Packet 10 times each except modified as follows: Superwoman standing at edge of bed. All done 10 times with VC for slow, controlled pace.                 PT Education - 10/01/16 1355    Education provided Yes   Education Details Strength ABC Program   Person(s) Educated Patient   Methods Explanation;Demonstration;Handout   Comprehension Verbalized understanding;Returned demonstration;Need further instruction                [facility] Goals - 09/25/16 1607      CC Long Term Goal  #1   Title Patient with verbalize an understanding of lymphedema risk reduction precautions   Status Achieved     CC Long Term Goal  #2   Title Patient will be independent in a home exercise program   Status On-going     CC Long Term Goal  #3   Title Patient will improve left shoulder abduction to 120 degrees with no pain  so that she can perform dressing tasks indpendently    Baseline 110 on 08/26/2016, 128 on 09/26/2015   Time 4   Status Achieved     CC Long Term Goal  #4   Title Patient will decrease the DASH score to <  38 to demonstrate increased functional use of upper extremity   Status On-going            Plan - 10/01/16 1344     Clinical Impression Statement Pt did well with instruction of Strength ABC Program/Packet today. Most cuing/correction she required was to keep abdominals engaged and decrease sway back for some of the exercises. She was able to correct this with cuing but due to weak abdominal muscles required lots of cuing, also to keep cervical spine in neutral alignment by avoiding extension. Pt reported feeling good after session and will also be careful to avoid any pain in her knees.    Rehab Potential Good   Clinical Impairments Affecting Rehab Potential 20 lymph nodes removed, previous radiation    PT Frequency 2x / week   PT Duration 4 weeks   PT Treatment/Interventions ADLs/Self Care Home Management;Patient/family education;Manual techniques;Scar  mobilization;Passive range of motion;Neuromuscular re-education;Therapeutic activities;Therapeutic exercise   PT Next Visit Plan Review Strength ABC Program prn. Remeasure arm prn if pain continues and consider TG soft. Arrange for pt to get measured sleeve.    Recommended Other Services Issued flyer to pt for ABC class (she is signed up for first one in Feb.) and [place] calendar with Harlin Rain classes.    Consulted and Agree with Plan of Care Patient      Patient will benefit from skilled therapeutic intervention in order to improve the following deficits and impairments:  Decreased knowledge of precautions, Decreased scar mobility, Decreased strength, Obesity, Pain, Impaired UE functional use, Increased fascial restricitons, Postural dysfunction  Visit Diagnosis: Stiffness of right shoulder joint  Pain in right upper arm  Abnormal posture  Disorder of the skin and subcutaneous tissue related to radiation, unspecified     Problem List Patient Active Problem List   Diagnosis Date Noted  . Insomnia 07/16/2016  . Left groin pain 11/30/2015  . Severe obesity (BMI >= 40) ([place]) 10/26/2015  . Breast cancer of lower-inner quadrant of right female  breast ([place]) 08/08/2015  . Diabetes mellitus without complication ([place]) 64/35/3912  . Paraspinous mass, left 03/31/2013  . Internal hemorrhoid 02/17/2013  . Abdominal pain, other specified site 02/17/2013  . Neoplasm of soft tissue, left shoulder and left upper arm 08/03/2012  . Lightheadedness 01/29/2012  . Cholelithiasis with cholecystitis 10/08/2011  . Hypokalemia 09/17/2011  . GERD 08/08/2010  . GOUT 12/18/2008  . Essential hypertension 03/15/2007  . HEADACHE 03/15/2007    Otelia Limes, PTA 10/01/2016, 1:55 PM  [place], [place], [address] Phone: [phone]   Fax:  [phone]  Name: KATYE VALEK MRN: [id number] Date of Birth: 04/11/1961

## 2016-10-02 ENCOUNTER — Ambulatory Visit
Admission: RE | Admit: 2016-10-02 | Discharge: 2016-10-02 | Disposition: A | Discharge status: Home / Self Care | Payer: 59 | Source: Ambulatory Visit | Attending: Oncology | Admitting: Oncology

## 2016-10-02 ENCOUNTER — Ambulatory Visit
Admission: RE | Admit: 2016-10-02 | Discharge: 2016-10-02 | Disposition: A | Discharge status: Home / Self Care | Payer: 59 | Source: Ambulatory Visit | Attending: Adult Health | Admitting: Adult Health

## 2016-10-02 DIAGNOSIS — Z170 Estrogen receptor positive status [ER+]: Principal | ICD-10-CM

## 2016-10-02 DIAGNOSIS — Z780 Asymptomatic menopausal state: Secondary | ICD-10-CM | POA: Diagnosis not present

## 2016-10-02 DIAGNOSIS — C50311 Malignant neoplasm of lower-inner quadrant of right female breast: Secondary | ICD-10-CM

## 2016-10-02 DIAGNOSIS — Z1231 Encounter for screening mammogram for malignant neoplasm of breast: Secondary | ICD-10-CM

## 2016-10-02 DIAGNOSIS — Z13820 Encounter for screening for osteoporosis: Secondary | ICD-10-CM | POA: Diagnosis not present

## 2016-10-03 ENCOUNTER — Ambulatory Visit: Payer: 59 | Admitting: Physical Therapy

## 2016-10-03 ENCOUNTER — Telehealth: Payer: Self-pay | Admitting: *Deleted

## 2016-10-03 DIAGNOSIS — M25611 Stiffness of right shoulder, not elsewhere classified: Secondary | ICD-10-CM

## 2016-10-03 DIAGNOSIS — M79621 Pain in right upper arm: Secondary | ICD-10-CM

## 2016-10-03 DIAGNOSIS — L599 Disorder of the skin and subcutaneous tissue related to radiation, unspecified: Secondary | ICD-10-CM

## 2016-10-03 DIAGNOSIS — R293 Abnormal posture: Secondary | ICD-10-CM

## 2016-10-03 DIAGNOSIS — M6281 Muscle weakness (generalized): Secondary | ICD-10-CM

## 2016-10-03 NOTE — Therapy (Signed)
[place], [place], [address] Phone: [phone]   Fax:  [phone]  Physical Therapy Treatment  Patient Details  Name: [name] MRN: [id number] Date of Birth: May 19, 1961 Referring Provider: Mike Craze  Encounter Date: 10/03/2016      PT End of Session - 10/03/16 1007    Visit Number 6   Number of Visits 9   Date for PT Re-Evaluation 10/28/16      Past Medical History:  Diagnosis Date  . Breast cancer ([place])   . Breast cancer of lower-inner quadrant of right female breast ([place]) 08/08/2015  . Colon polyps   . DDD (degenerative disc disease), cervical   . Degenerative joint disease   . Diabetes mellitus without complication ([place])   . Diverticulosis   . Gallstones   . GERD (gastroesophageal reflux disease)   . Headache(784.0)    migraine like per patient  . Hypertension   . Low back pain    dr phillips, pain management  . Osteoarthritis     Past Surgical History:  Procedure Laterality Date  . CHOLECYSTECTOMY  10/28/2011   Procedure: LAPAROSCOPIC CHOLECYSTECTOMY WITH INTRAOPERATIVE CHOLANGIOGRAM;  Surgeon: Earnstine Regal, MD;  [facility];  Service: General;  Laterality: N/A;  . COLONOSCOPY WITH ESOPHAGOGASTRODUODENOSCOPY (EGD)  02/03/2013   with Propofol  . EXAM UNDER ANESTHESIA WITH MANIPULATION OF KNEE Right 05/30/2003  . EXAM UNDER ANESTHESIA WITH MANIPULATION OF KNEE Left 12/27/2002  . GANGLION CYST EXCISION Right    right wrist  . KNEE ARTHROSCOPY Bilateral 01/02/2000  . KNEE ARTHROSCOPY Right   . LAPAROSCOPIC VAGINAL HYSTERECTOMY WITH SALPINGO OOPHORECTOMY Bilateral 07/13/2006  . LIPOMA EXCISION Right 02/21/2008   hip  . MASS EXCISION  08/24/2012   Procedure: EXCISION MASS;  Surgeon: Earnstine Regal, MD;  Location: [place];  Service: General;  Laterality: Left;  excisie soft tissue masses left shoulder(back) & left upper arm  . MASS EXCISION Left  01/24/2014   Procedure: EXCISION SOFT TISSUE MASSES LOWER LEFT BACK;  Surgeon: Earnstine Regal, MD;  Location: [place];  Service: General;  Laterality: Left;  Marland Kitchen MASS EXCISION Right 04/30/2016   Procedure: EXCISION OF SKIN RIGHT CHEST WALL;  Surgeon: Autumn Messing III, MD;  Location: [place];  Service: General;  Laterality: Right;  EXCISION OF SKIN RIGHT CHEST WALL  . MASTECTOMY W/ SENTINEL NODE BIOPSY Right 10/01/2015   Procedure: RIGHT MASTECTOMY WITH SENTINEL LYMPH NODE BIOPSY;  Surgeon: Autumn Messing III, MD;  Location: [place];  Service: General;  Laterality: Right;  . PORT-A-CATH REMOVAL Left 04/30/2016   Procedure: REMOVAL PORT-A-CATH;  Surgeon: Autumn Messing III, MD;  Location: [place];  Service: General;  Laterality: Left;  REMOVAL PORT-A-CATH  . PORTACATH PLACEMENT Left 11/01/2015   Procedure: INSERTION PORT-A-CATH;  Surgeon: Autumn Messing III, MD;  Location: [place];  Service: General;  Laterality: Left;  . REPLACEMENT UNICONDYLAR JOINT KNEE Left 04/20/2001  . REVISION TOTAL KNEE ARTHROPLASTY Right 06/18/2004  . REVISION TOTAL KNEE ARTHROPLASTY Left 11/15/2002  . REVISION TOTAL KNEE ARTHROPLASTY Left 10/12/2001  . REVISION TOTAL KNEE ARTHROPLASTY Right 07/09/2015  . TONSILLECTOMY    . TOTAL KNEE ARTHROPLASTY Right 04/25/2003  . TOTAL KNEE ARTHROPLASTY Left 06/25/2009    There were no vitals filed for this visit.      Subjective Assessment - 10/03/16 0942    Subjective pt c/o back pain today.  She thinks the anastazole is  causing more achiness than usual    Pertinent History breast cancer with right  mastectomy with 20 lymph nodes removed in january,  chemo with peripheral neuropathy  and  had to have more skin removed before radiation radiation with skin disruption which was concluded last of October.  Past history includes multiple levels of degenerative disc disease in neck and spine and 4 knee surgeries on each knee with the last one in  October 2016    Patient Stated Goals to have right arm get stronger and move better    Pain Score 2    Pain Location Shoulder   Pain Orientation Right   Pain Descriptors / Indicators Aching   Pain Type Chronic pain   Pain Radiating Towards toward chest and back    Pain Onset More than a month ago   Pain Frequency Intermittent   Aggravating Factors  anastrazole    Pain Relieving Factors pain    Effect of Pain on Daily Activities needs help with activities             [facility] PT Assessment - 10/03/16 0001      Assessment   Medical Diagnosis breast cancer   Referring Provider Mike Craze   Onset Date/Surgical Date 08/02/15   Hand Dominance Right   Prior Therapy none     Precautions   Precautions Other (comment)   Precaution Comments previous chemo and radiation      Restrictions   Weight Bearing Restrictions No     Home Environment   Living Environment Private residence   Living Arrangements Spouse/significant other   Available Help at Discharge Available PRN/intermittently   Type of [place] to enter   Entrance Stairs-Number of Steps 2     Prior Function   Level of [place] with basic ADLs  needs help with fastening her bra   Vocation On disability   Leisure sew, cook, can't do crotchet , sing      Cognition   Overall Cognitive Status Impaired/Different from baseline   Memory Impaired   Memory Impairment Retrieval deficit  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    Quick DASH  52.57     Sensation   Light Touch Not tested  pt reports she neuropathy is getting better      Coordination   Gross Motor Movements are Fluid and Coordinated No  pt does not want to address neuropathy symptoms at this time     Posture/Postural Control   Posture/Postural Control Postural  limitations   Postural Limitations Rounded Shoulders;Forward head     AROM   Right Shoulder Extension 50 Degrees   Right Shoulder Flexion 110 Degrees  pulling at armpit    Right Shoulder ABduction 110 Degrees  painful    Right Shoulder Internal Rotation --  in supine 60   Right Shoulder External Rotation 57 Degrees  painful :in supine    Left Shoulder Flexion 160 Degrees   Left Shoulder ABduction 130 Degrees   Left Shoulder Internal Rotation 60 Degrees   Left Shoulder External Rotation 75 Degrees     Strength   Right Shoulder Flexion 3-/5  limited bu pain   Right Shoulder ABduction 3-/5  limited by pain    Left Shoulder Flexion 3/5   Left Shoulder ABduction 3/5  Palpation   Palpation comment decreased scar mobility at right chest mastectomy scar                      [facility] Adult PT Treatment/Exercise - 10/03/16 0001      Lumbar Exercises: Supine   Ab Set 5 reps   Heel Slides 5 reps   Heel Slides Limitations cues to keep core engaged    Bent Knee Raise 5 reps   Bent Knee Raise Limitations only raise leg up an inch or two    Bridge 5 reps   Other Supine Lumbar Exercises pelvic tilts      Shoulder Exercises: Supine   Other Supine Exercises chest press  2 sets of 10 with  1 # weight      Shoulder Exercises: Sidelying   External Rotation Strengthening;Right;10 reps   External Rotation Weight (lbs) 10 1#, 8 eith 2# limited by pain    ABduction AROM;Right;10 reps   Other Sidelying Exercises small controlled circles with      Modalities   Modalities Moist Heat     Moist Heat Therapy   Number Minutes Moist Heat 15 Minutes   Moist Heat Location Lumbar Spine                        Long Term Clinic Goals - 10/03/16 1006      CC Long Term Goal  #1   Title Patient with verbalize an understanding of lymphedema risk reduction precautions   Status Achieved     CC Long Term Goal  #2   Title Patient will be independent in a home exercise  program   Status On-going     CC Long Term Goal  #3   Title Patient will improve left shoulder abduction to 120 degrees with no pain  so that she can perform dressing tasks indpendently    Baseline 110 on 08/26/2016, 128 on 09/26/2015   Status Achieved     CC Long Term Goal  #4   Title Patient will decrease the DASH score to <  38 to demonstrate increased functional use of upper extremity   Time 4   Period Weeks   Status On-going     CC Long Term Goal  #5   Title Pt will know how to get prophylactic compression sleeve and gauntlet    Time 4   Period Weeks   Status New            Plan - 10/03/16 1004    Clinical Impression Statement Ms. Mitrovic continues to have diffuse pain especially in right shoulder, low back and LE's from previous arthritis that she thinks is made worse by anastrozole. She has been instructed in exercise for her shoulder and feels it is getting better, though she still has pain.  She does not have a compression sleeve yet so will send for prescription and give pt information to obtain sleeve once that is returned.  Pt knows about community exercise to follow up with but has not made plans to attend yet.  will need to send for renewal becuase of these issues and that  she is still limited by pain and has t   Rehab Potential Good   Clinical Impairments Affecting Rehab Potential 20 lymph nodes removed, previous radiation    PT Frequency 2x / week   PT Duration 4 weeks   PT Treatment/Interventions ADLs/Self Care Home Management;Patient/family education;Manual techniques;Scar mobilization;Passive range of motion;Neuromuscular  re-education;Therapeutic activities;Therapeutic exercise   PT Next Visit Plan Send prescription for compression sleeve and gauntlet. Try to increase exercise as pain allows.  Reinforce Strength ABC exercises Encourage pt to attend ABC Class  as she plans to in February      Patient will benefit from skilled therapeutic intervention in order to  improve the following deficits and impairments:  Decreased knowledge of precautions, Decreased scar mobility, Decreased strength, Obesity, Pain, Impaired UE functional use, Increased fascial restricitons, Postural dysfunction  Visit Diagnosis: Stiffness of right shoulder joint - Plan: PT plan of care cert/re-cert  Pain in right upper arm - Plan: PT plan of care cert/re-cert  Abnormal posture - Plan: PT plan of care cert/re-cert  Disorder of the skin and subcutaneous tissue related to radiation, unspecified - Plan: PT plan of care cert/re-cert  Muscle weakness (generalized) - Plan: PT plan of care cert/re-cert     Problem List Patient Active Problem List   Diagnosis Date Noted  . Insomnia 07/16/2016  . Left groin pain 11/30/2015  . Severe obesity (BMI >= 40) ([place]) 10/26/2015  . Breast cancer of lower-inner quadrant of right female breast ([place]) 08/08/2015  . Diabetes mellitus without complication ([place]) [id number]  . Paraspinous mass, left 03/31/2013  . Internal hemorrhoid 02/17/2013  . Abdominal pain, other specified site 02/17/2013  . Neoplasm of soft tissue, left shoulder and left upper arm 08/03/2012  . Lightheadedness 01/29/2012  . Cholelithiasis with cholecystitis 10/08/2011  . Hypokalemia 09/17/2011  . GERD 08/08/2010  . GOUT 12/18/2008  . Essential hypertension 03/15/2007  . HEADACHE 03/15/2007   Donato Heinz. Owens Shark PT  Norwood Levo 10/03/2016, 10:36 AM  [place], [place], [address] Phone: [phone]   Fax:  [phone]  Name: [name] MRN: [id number] Date of Birth: Jan 06, 1961

## 2016-10-03 NOTE — Telephone Encounter (Signed)
Pt called requesting the results from her bone density scan.   Also states she is having a lot of bone and back pain since starting the anastrazole. "I'm on some pretty heavy duty pain meds, but am still having pain from the anastrazole. I know this is the best medicine for me to take"  Return visit with Dr Jana Hakim is 2/26

## 2016-10-05 ENCOUNTER — Other Ambulatory Visit: Payer: Self-pay | Admitting: Family Medicine

## 2016-10-06 ENCOUNTER — Encounter: Payer: Self-pay | Admitting: Physical Therapy

## 2016-10-06 ENCOUNTER — Ambulatory Visit: Payer: 59 | Admitting: Physical Therapy

## 2016-10-06 DIAGNOSIS — M25611 Stiffness of right shoulder, not elsewhere classified: Secondary | ICD-10-CM | POA: Diagnosis not present

## 2016-10-06 DIAGNOSIS — R293 Abnormal posture: Secondary | ICD-10-CM

## 2016-10-06 DIAGNOSIS — M6281 Muscle weakness (generalized): Secondary | ICD-10-CM

## 2016-10-06 DIAGNOSIS — M79621 Pain in right upper arm: Secondary | ICD-10-CM

## 2016-10-06 NOTE — Therapy (Signed)
[place], [place], [address] Phone: [phone]   Fax:  [phone]  Physical Therapy Treatment  Patient Details  Name: [name] MRN: [id number] Date of Birth: 05-May-1961 Referring Provider: Mike Craze  Encounter Date: 10/06/2016      PT End of Session - 10/06/16 1553    Visit Number 7   Number of Visits 9   Date for PT Re-Evaluation 10/28/16   PT Start Time 1505   PT Stop Time 1550   PT Time Calculation (min) 45 min   Activity Tolerance Patient tolerated treatment well   Behavior During Therapy [facility] for tasks assessed/performed      Past Medical History:  Diagnosis Date  . Breast cancer ([place])   . Breast cancer of lower-inner quadrant of right female breast ([place]) 08/08/2015  . Colon polyps   . DDD (degenerative disc disease), cervical   . Degenerative joint disease   . Diabetes mellitus without complication ([place])   . Diverticulosis   . Gallstones   . GERD (gastroesophageal reflux disease)   . Headache(784.0)    migraine like per patient  . Hypertension   . Low back pain    dr phillips, pain management  . Osteoarthritis     Past Surgical History:  Procedure Laterality Date  . CHOLECYSTECTOMY  10/28/2011   Procedure: LAPAROSCOPIC CHOLECYSTECTOMY WITH INTRAOPERATIVE CHOLANGIOGRAM;  Surgeon: Earnstine Regal, MD;  [facility];  Service: General;  Laterality: N/A;  . COLONOSCOPY WITH ESOPHAGOGASTRODUODENOSCOPY (EGD)  02/03/2013   with Propofol  . EXAM UNDER ANESTHESIA WITH MANIPULATION OF KNEE Right 05/30/2003  . EXAM UNDER ANESTHESIA WITH MANIPULATION OF KNEE Left 12/27/2002  . GANGLION CYST EXCISION Right    right wrist  . KNEE ARTHROSCOPY Bilateral 01/02/2000  . KNEE ARTHROSCOPY Right   . LAPAROSCOPIC VAGINAL HYSTERECTOMY WITH SALPINGO OOPHORECTOMY Bilateral 07/13/2006  . LIPOMA EXCISION Right 02/21/2008   hip  . MASS EXCISION  08/24/2012   Procedure: EXCISION MASS;   Surgeon: Earnstine Regal, MD;  Location: [place];  Service: General;  Laterality: Left;  excisie soft tissue masses left shoulder(back) & left upper arm  . MASS EXCISION Left 01/24/2014   Procedure: EXCISION SOFT TISSUE MASSES LOWER LEFT BACK;  Surgeon: Earnstine Regal, MD;  Location: [place];  Service: General;  Laterality: Left;  Marland Kitchen MASS EXCISION Right 04/30/2016   Procedure: EXCISION OF SKIN RIGHT CHEST WALL;  Surgeon: Autumn Messing III, MD;  Location: [place];  Service: General;  Laterality: Right;  EXCISION OF SKIN RIGHT CHEST WALL  . MASTECTOMY W/ SENTINEL NODE BIOPSY Right 10/01/2015   Procedure: RIGHT MASTECTOMY WITH SENTINEL LYMPH NODE BIOPSY;  Surgeon: Autumn Messing III, MD;  Location: [place];  Service: General;  Laterality: Right;  . PORT-A-CATH REMOVAL Left 04/30/2016   Procedure: REMOVAL PORT-A-CATH;  Surgeon: Autumn Messing III, MD;  Location: [place];  Service: General;  Laterality: Left;  REMOVAL PORT-A-CATH  . PORTACATH PLACEMENT Left 11/01/2015   Procedure: INSERTION PORT-A-CATH;  Surgeon: Autumn Messing III, MD;  Location: [place];  Service: General;  Laterality: Left;  . REPLACEMENT UNICONDYLAR JOINT KNEE Left 04/20/2001  . REVISION TOTAL KNEE ARTHROPLASTY Right 06/18/2004  . REVISION TOTAL KNEE ARTHROPLASTY Left 11/15/2002  . REVISION TOTAL KNEE ARTHROPLASTY Left 10/12/2001  . REVISION TOTAL KNEE ARTHROPLASTY Right 07/09/2015  . TONSILLECTOMY    . TOTAL KNEE ARTHROPLASTY Right 04/25/2003  . TOTAL KNEE ARTHROPLASTY Left  06/25/2009    There were no vitals filed for this visit.      Subjective Assessment - 10/06/16 1511    Subjective Patient has been doing the strength after breast cancer exercises at home at least once daily. She reports some of them she can not do because of her back.    Pertinent History breast cancer with right  mastectomy with 20 lymph nodes removed in january,  chemo with peripheral neuropathy   and  had to have more skin removed before radiation radiation with skin disruption which was concluded last of October.  Past history includes multiple levels of degenerative disc disease in neck and spine and 4 knee surgeries on each knee with the last one in October 2016    Patient Stated Goals to have right arm get stronger and move better    Currently in Pain? Yes   Pain Score 6    Pain Location Back   Pain Orientation Lower   Pain Descriptors / Indicators Aching   Pain Type Chronic pain   Pain Onset 1 to 4 weeks ago   Pain Frequency Constant   Aggravating Factors  anastrazole   Pain Relieving Factors pain meds                         [facility] Adult PT Treatment/Exercise - 10/06/16 0001      Shoulder Exercises: Sidelying   External Rotation Strengthening;Right;10 reps   External Rotation Weight (lbs) 2# x 10   ABduction AROM;Right;10 reps   Other Sidelying Exercises small controlled circles with      Shoulder Exercises: ROM/Strengthening   Other ROM/Strengthening Exercises All Stretching exercises from Strength ABC Program/Packet 2 time each for 15 seconds except butterfly or piriformis as pt is unable due to multiple knee replacements.    Other ROM/Strengthening Exercises All Strengthening exercises from Strength ABC Program/Packet 10 times each except modified as follows: Superwoman standing at edge of bed. All done 10 times with VC for slow, controlled pace.   strengthening exercises with 2lb weights, did not do deadlif                        Long Term Clinic Goals - 10/03/16 1006      CC Long Term Goal  #1   Title Patient with verbalize an understanding of lymphedema risk reduction precautions   Status Achieved     CC Long Term Goal  #2   Title Patient will be independent in a home exercise program   Status On-going     CC Long Term Goal  #3   Title Patient will improve left shoulder abduction to 120 degrees with no pain  so that she can  perform dressing tasks indpendently    Baseline 110 on 08/26/2016, 128 on 09/26/2015   Status Achieved     CC Long Term Goal  #4   Title Patient will decrease the DASH score to <  38 to demonstrate increased functional use of upper extremity   Time 4   Period Weeks   Status On-going     CC Long Term Goal  #5   Title Pt will know how to get prophylactic compression sleeve and gauntlet    Time 4   Period Weeks   Status New            Plan - 10/06/16 1553    Clinical Impression Statement Re educated patient in strength after  breast cancer exercises today. Patient did great with the exercises with minimal back pain. Instructed pt in right shoulder ROM and strengthening exercises and pt had increased tightness in her R shoulder. Gave pt signed prescription and information to obtain compression sleeve and guantlet from local DME supplier.    Rehab Potential Good   Clinical Impairments Affecting Rehab Potential 20 lymph nodes removed, previous radiation    PT Frequency 2x / week   PT Duration 4 weeks   PT Treatment/Interventions ADLs/Self Care Home Management;Patient/family education;Manual techniques;Scar mobilization;Passive range of motion;Neuromuscular re-education;Therapeutic activities;Therapeutic exercise   PT Next Visit Plan follow up with patient if she has made appt at [facility], continue assessing for indep with strength ABC program, continued ROM for R shoulder   Consulted and Agree with Plan of Care Patient      Patient will benefit from skilled therapeutic intervention in order to improve the following deficits and impairments:  Decreased knowledge of precautions, Decreased scar mobility, Decreased strength, Obesity, Pain, Impaired UE functional use, Increased fascial restricitons, Postural dysfunction  Visit Diagnosis: Stiffness of right shoulder joint  Pain in right upper arm  Abnormal posture  Muscle weakness (generalized)     Problem List Patient Active  Problem List   Diagnosis Date Noted  . Insomnia 07/16/2016  . Left groin pain 11/30/2015  . Severe obesity (BMI >= 40) ([place]) 10/26/2015  . Breast cancer of lower-inner quadrant of right female breast ([place]) 08/08/2015  . Diabetes mellitus without complication ([place]) [id number]  . Paraspinous mass, left 03/31/2013  . Internal hemorrhoid 02/17/2013  . Abdominal pain, other specified site 02/17/2013  . Neoplasm of soft tissue, left shoulder and left upper arm 08/03/2012  . Lightheadedness 01/29/2012  . Cholelithiasis with cholecystitis 10/08/2011  . Hypokalemia 09/17/2011  . GERD 08/08/2010  . GOUT 12/18/2008  . Essential hypertension 03/15/2007  . HEADACHE 03/15/2007    Allyson Sabal [facility] 10/06/2016, 3:56 PM  [place], [place], [address] Phone: [phone]   Fax:  [phone]  Name: [name] MRN: [id number] Date of Birth: [birth year]  Manus Gunning, PT 10/06/16 3:56 PM

## 2016-10-07 ENCOUNTER — Telehealth: Payer: Self-pay | Admitting: *Deleted

## 2016-10-07 NOTE — Telephone Encounter (Signed)
Pt states she started on anastrazole on 08/22/16.Since Saturday she has had severe vaginal dryness and burning when she wipes. Tried OTC med for UTI, but she says she doesn't think it is a UTI. Noted light pink urine after church on Sunday- feels it is irritation from the dryness.

## 2016-10-07 NOTE — Telephone Encounter (Signed)
This RN discussed pt's concern as well as use of lubricating non scented lotions for dryness- including coconut oil.  Pt will institute above- twice a day and call if symptoms are not relieved.  Kaite states she is signed up for the [facility] classes which include the Pelvic Health class which addresses vaginal dryness issues secondary to antiestrogens.

## 2016-10-09 ENCOUNTER — Other Ambulatory Visit: Payer: Self-pay | Admitting: Family Medicine

## 2016-10-10 DIAGNOSIS — M150 Primary generalized (osteo)arthritis: Secondary | ICD-10-CM | POA: Diagnosis not present

## 2016-10-10 DIAGNOSIS — G894 Chronic pain syndrome: Secondary | ICD-10-CM | POA: Diagnosis not present

## 2016-10-10 DIAGNOSIS — Z79891 Long term (current) use of opiate analgesic: Secondary | ICD-10-CM | POA: Diagnosis not present

## 2016-10-13 ENCOUNTER — Ambulatory Visit: Payer: 59 | Admitting: Physical Therapy

## 2016-10-15 ENCOUNTER — Telehealth: Payer: Self-pay

## 2016-10-15 ENCOUNTER — Ambulatory Visit: Payer: 59

## 2016-10-15 NOTE — Telephone Encounter (Signed)
Pt hadn't arrived for her 3:15 appt this afternoon so called and spoke with her. She reports due to her husbands job she only has transportation on Mondays now and forgot to call us to cancel. She says her back is some better though knows Anastrazole is biggest contributor to her pain and Dr. Jana Hakim wants her to stay on it for now. Spoke with pt about fact that since she can only come 1x/week now we will solidify her HEP to help her maintain progress during week. She was agreeable to this and apologized for not being able to come today.

## 2016-10-20 ENCOUNTER — Ambulatory Visit: Payer: 59 | Admitting: Physical Therapy

## 2016-10-20 ENCOUNTER — Encounter: Payer: Self-pay | Admitting: Physical Therapy

## 2016-10-20 DIAGNOSIS — M25611 Stiffness of right shoulder, not elsewhere classified: Secondary | ICD-10-CM

## 2016-10-20 DIAGNOSIS — R293 Abnormal posture: Secondary | ICD-10-CM

## 2016-10-20 DIAGNOSIS — M79621 Pain in right upper arm: Secondary | ICD-10-CM

## 2016-10-20 DIAGNOSIS — M6281 Muscle weakness (generalized): Secondary | ICD-10-CM

## 2016-10-20 NOTE — Therapy (Signed)
[place], [place], [address] Phone: [phone]   Fax:  [phone]  Physical Therapy Treatment  Patient Details  Name: [name] MRN: [id number] Date of Birth: 19-Jul-1961 Referring Provider: Mike Craze  Encounter Date: 10/20/2016      PT End of Session - 10/20/16 1550    Visit Number 8   Number of Visits 9   Date for PT Re-Evaluation 10/28/16   PT Start Time 2951   PT Stop Time 1545   PT Time Calculation (min) 49 min   Activity Tolerance Patient tolerated treatment well   Behavior During Therapy [facility] for tasks assessed/performed      Past Medical History:  Diagnosis Date  . Breast cancer ([facility])   . Breast cancer of lower-inner quadrant of right female breast ([place]) 08/08/2015  . Colon polyps   . DDD (degenerative disc disease), cervical   . Degenerative joint disease   . Diabetes mellitus without complication ([place])   . Diverticulosis   . Gallstones   . GERD (gastroesophageal reflux disease)   . Headache(784.0)    migraine like per patient  . Hypertension   . Low back pain    dr phillips, pain management  . Osteoarthritis     Past Surgical History:  Procedure Laterality Date  . CHOLECYSTECTOMY  10/28/2011   Procedure: LAPAROSCOPIC CHOLECYSTECTOMY WITH INTRAOPERATIVE CHOLANGIOGRAM;  Surgeon: Earnstine Regal, MD;  Location: [facility];  Service: General;  Laterality: N/A;  . COLONOSCOPY WITH ESOPHAGOGASTRODUODENOSCOPY (EGD)  02/03/2013   with Propofol  . EXAM UNDER ANESTHESIA WITH MANIPULATION OF KNEE Right 05/30/2003  . EXAM UNDER ANESTHESIA WITH MANIPULATION OF KNEE Left 12/27/2002  . GANGLION CYST EXCISION Right    right wrist  . KNEE ARTHROSCOPY Bilateral 01/02/2000  . KNEE ARTHROSCOPY Right   . LAPAROSCOPIC VAGINAL HYSTERECTOMY WITH SALPINGO OOPHORECTOMY Bilateral 07/13/2006  . LIPOMA EXCISION Right 02/21/2008   hip  . MASS EXCISION  08/24/2012   Procedure: EXCISION MASS;   Surgeon: Earnstine Regal, MD;  Location: [place];  Service: General;  Laterality: Left;  excisie soft tissue masses left shoulder(back) & left upper arm  . MASS EXCISION Left 01/24/2014   Procedure: EXCISION SOFT TISSUE MASSES LOWER LEFT BACK;  Surgeon: Earnstine Regal, MD;  Location: [place];  Service: General;  Laterality: Left;  Marland Kitchen MASS EXCISION Right 04/30/2016   Procedure: EXCISION OF SKIN RIGHT CHEST WALL;  Surgeon: Autumn Messing III, MD;  Location: [place];  Service: General;  Laterality: Right;  EXCISION OF SKIN RIGHT CHEST WALL  . MASTECTOMY W/ SENTINEL NODE BIOPSY Right 10/01/2015   Procedure: RIGHT MASTECTOMY WITH SENTINEL LYMPH NODE BIOPSY;  Surgeon: Autumn Messing III, MD;  Location: [place];  Service: General;  Laterality: Right;  . PORT-A-CATH REMOVAL Left 04/30/2016   Procedure: REMOVAL PORT-A-CATH;  Surgeon: Autumn Messing III, MD;  Location: [place];  Service: General;  Laterality: Left;  REMOVAL PORT-A-CATH  . PORTACATH PLACEMENT Left 11/01/2015   Procedure: INSERTION PORT-A-CATH;  Surgeon: Autumn Messing III, MD;  Location: [place];  Service: General;  Laterality: Left;  . REPLACEMENT UNICONDYLAR JOINT KNEE Left 04/20/2001  . REVISION TOTAL KNEE ARTHROPLASTY Right 06/18/2004  . REVISION TOTAL KNEE ARTHROPLASTY Left 11/15/2002  . REVISION TOTAL KNEE ARTHROPLASTY Left 10/12/2001  . REVISION TOTAL KNEE ARTHROPLASTY Right 07/09/2015  . TONSILLECTOMY    . TOTAL KNEE ARTHROPLASTY Right 04/25/2003  . TOTAL KNEE ARTHROPLASTY Left  06/25/2009    There were no vitals filed for this visit.      Subjective Assessment - 10/20/16 1458    Subjective I think I am going to let this session be the last because I have transportation problems. I am having intense pain in the back of my right arm and across my side. It feels like my arm is swelling and my clothes are fitting tighter on that arm. I think the pain is from taking the  anastrazole. I called the lady about the sleeve and she told me to get in touch with [facility]. I haven't taken the time to call them yet.    Pertinent History breast cancer with right  mastectomy with 20 lymph nodes removed in january,  chemo with peripheral neuropathy  and  had to have more skin removed before radiation radiation with skin disruption which was concluded last of October.  Past history includes multiple levels of degenerative disc disease in neck and spine and 4 knee surgeries on each knee with the last one in October 2016    Patient Stated Goals to have right arm get stronger and move better    Currently in Pain? Yes   Pain Score 4    Pain Location Neck   Pain Descriptors / Indicators Aching   Pain Type Chronic pain               LYMPHEDEMA/ONCOLOGY QUESTIONNAIRE - 10/20/16 1502      Right Upper Extremity Lymphedema   10 cm Proximal to Olecranon Process 37 cm   Olecranon Process 31.5 cm   15 cm Proximal to Ulnar Styloid Process 31.5 cm   Just Proximal to Ulnar Styloid Process 21.8 cm   Across Hand at [employer] 23.6 cm   At [place] of 2nd Digit 7.6 cm           Quick Dash - 10/20/16 0001    Open a tight or new jar Moderate difficulty   Do heavy household chores (wash walls, wash floors) Severe difficulty   Carry a shopping bag or briefcase Mild difficulty   Wash your back Moderate difficulty   Use a knife to cut food Moderate difficulty   Recreational activities in which you take some force or impact through your arm, shoulder, or hand (golf, hammering, tennis) Moderate difficulty   During the past week, to what extent has your arm, shoulder or hand problem interfered with your normal social activities with family, friends, neighbors, or groups? Quite a bit   During the past week, to what extent has your arm, shoulder or hand problem limited your work or other regular daily activities Modererately   Arm, shoulder, or hand pain. Moderate   Tingling (pins and  needles) in your arm, shoulder, or hand Severe   Difficulty Sleeping Moderate difficulty   DASH Score 54.55 %               [facility] Adult PT Treatment/Exercise - 10/20/16 0001      Shoulder Exercises: ROM/Strengthening   Other ROM/Strengthening Exercises All Stretching exercises from Strength ABC Program/Packet  each for 15 seconds except butterfly or piriformis as pt is unable due to multiple knee replacements.    Other ROM/Strengthening Exercises All Strengthening exercises from Strength ABC Program/Packet 10 times each except modified as follows: Superwoman standing at edge of bed. All done 10 times with VC for slow, controlled pace.   strengthening exercises with 2lb weights, did not do deadlif  Long Term Clinic Goals - 10-21-16 1510      CC Long Term Goal  #1   Title Patient with verbalize an understanding of lymphedema risk reduction precautions   Time 4   Period Weeks   Status Achieved     CC Long Term Goal  #2   Title Patient will be independent in a home exercise program   Baseline Pt states she is independent in strength after breast cancer program- pt reports she completes it once a day   Time 4   Period Weeks   Status Achieved     CC Long Term Goal  #3   Title Patient will improve left shoulder abduction to 120 degrees with no pain  so that she can perform dressing tasks indpendently    Baseline 110 on 08/26/2016, 128 on 09/26/2015   Time 4   Period Weeks   Status Achieved     CC Long Term Goal  #4   Title Patient will decrease the DASH score to <  38 to demonstrate increased functional use of upper extremity   Baseline 10-21-16- 54% - pt reports her arm pain is worse and she feels it is from the anastrozole   Time 4   Period Weeks   Status Not Met     CC Long Term Goal  #5   Title Pt will know how to get prophylactic compression sleeve and gauntlet    Baseline 10/21/16- Pt has been given information to obtain sleeve, she  is supposed to call [facility] and today states she has not had time but plans on doing so and obtaining compression sleeve   Time 4   Period Weeks   Status Partially Met            Plan - Oct 21, 2016 1546    Clinical Impression Statement Patient requested to be discharged today due to transportation issues. She states she will continue her home program. She was given more information for obtaining compression sleeve and states she will follow up with this. A piece of TG soft size small was cut for pt to wear on her RUE to provide more support and decrease discomfort. Pt reported increased comfort while wearing this. Reviewed pt's home exercise program with her and educated pt on importance of obtaining a compression sleeve. Pt will be discharged from skilled PT services at this time.    Rehab Potential Good   Clinical Impairments Affecting Rehab Potential 20 lymph nodes removed, previous radiation    PT Frequency 2x / week   PT Duration 4 weeks   PT Treatment/Interventions ADLs/Self Care Home Management;Patient/family education;Manual techniques;Scar mobilization;Passive range of motion;Neuromuscular re-education;Therapeutic activities;Therapeutic exercise   PT Next Visit Plan d/c this visit   Consulted and Agree with Plan of Care Patient      Patient will benefit from skilled therapeutic intervention in order to improve the following deficits and impairments:  Decreased knowledge of precautions, Decreased scar mobility, Decreased strength, Obesity, Pain, Impaired UE functional use, Increased fascial restricitons, Postural dysfunction  Visit Diagnosis: Stiffness of right shoulder joint  Pain in right upper arm  Abnormal posture  Muscle weakness (generalized)       G-Codes - 10-21-2016 1549    Functional Assessment Tool Used Quick DASH    Functional Limitation Carrying, moving and handling objects   Carrying, Moving and Handling Objects Goal Status ([id number]) At least 20 percent but less  than 40 percent impaired, limited or restricted   Carrying, Moving and Handling Objects  Discharge Status [phone]) At least 40 percent but less than 60 percent impaired, limited or restricted      Problem List Patient Active Problem List   Diagnosis Date Noted  . Insomnia 07/16/2016  . Left groin pain 11/30/2015  . Severe obesity (BMI >= 40) ([place]) 10/26/2015  . Breast cancer of lower-inner quadrant of right female breast ([place]) 08/08/2015  . Diabetes mellitus without complication ([place]) 12/75/1700  . Paraspinous mass, left 03/31/2013  . Internal hemorrhoid 02/17/2013  . Abdominal pain, other specified site 02/17/2013  . Neoplasm of soft tissue, left shoulder and left upper arm 08/03/2012  . Lightheadedness 01/29/2012  . Cholelithiasis with cholecystitis 10/08/2011  . Hypokalemia 09/17/2011  . GERD 08/08/2010  . GOUT 12/18/2008  . Essential hypertension 03/15/2007  . HEADACHE 03/15/2007    Allyson Sabal [facility] 10/20/2016, 3:53 PM  [place], [place], [address] Phone: [phone]   Fax:  [phone]  Name: [name] MRN: [id number] Date of Birth: July 09, 1961  PHYSICAL THERAPY DISCHARGE SUMMARY  Visits from Start of Care: 8  Current functional level related to goals / functional outcomes: See goals above   Remaining deficits: Pt still with minimal improvement in DASH score secondary to arm pain which she feels is related to the anastrazole   Education / Equipment: HEP, obtaining a compression sleeve Plan: Patient agrees to discharge.  Patient goals were partially met. Patient is being discharged due to the patient's request.  ?????    Allyson Sabal [place], [place] 10/20/16 3:54 PM

## 2016-10-21 ENCOUNTER — Encounter: Payer: Self-pay | Admitting: Oncology

## 2016-10-21 NOTE — Progress Notes (Signed)
Patient called to inquire about receiving sleeve/glove. Patient wanted to confirm she could take rx to [facility] to receive items and it would be paid for being that she just finished treatment here. Advised patient to take to [facility] and have them fax [place] the invoice.

## 2016-10-22 ENCOUNTER — Ambulatory Visit: Payer: 59

## 2016-10-24 ENCOUNTER — Encounter: Payer: Self-pay | Admitting: Oncology

## 2016-10-24 NOTE — Progress Notes (Signed)
Returned pt's call regarding her sleeve but wasn't able to leave a msg.

## 2016-11-04 DIAGNOSIS — C50511 Malignant neoplasm of lower-outer quadrant of right female breast: Secondary | ICD-10-CM | POA: Diagnosis not present

## 2016-11-13 ENCOUNTER — Other Ambulatory Visit: Payer: Self-pay | Admitting: Family Medicine

## 2016-11-17 ENCOUNTER — Telehealth: Payer: Self-pay | Admitting: *Deleted

## 2016-11-17 ENCOUNTER — Encounter: Payer: Self-pay | Admitting: Oncology

## 2016-11-17 ENCOUNTER — Ambulatory Visit (HOSPITAL_BASED_OUTPATIENT_CLINIC_OR_DEPARTMENT_OTHER): Payer: 59 | Admitting: Oncology

## 2016-11-17 ENCOUNTER — Other Ambulatory Visit (HOSPITAL_BASED_OUTPATIENT_CLINIC_OR_DEPARTMENT_OTHER): Payer: 59

## 2016-11-17 VITALS — BP 147/81 | HR 71 | Temp 98.4°F | Resp 20 | Ht 67.0 in | Wt 253.8 lb

## 2016-11-17 DIAGNOSIS — C773 Secondary and unspecified malignant neoplasm of axilla and upper limb lymph nodes: Secondary | ICD-10-CM | POA: Diagnosis not present

## 2016-11-17 DIAGNOSIS — N951 Menopausal and female climacteric states: Secondary | ICD-10-CM | POA: Diagnosis not present

## 2016-11-17 DIAGNOSIS — C50311 Malignant neoplasm of lower-inner quadrant of right female breast: Secondary | ICD-10-CM

## 2016-11-17 DIAGNOSIS — Z170 Estrogen receptor positive status [ER+]: Secondary | ICD-10-CM

## 2016-11-17 LAB — COMPREHENSIVE METABOLIC PANEL
ALT: 12 U/L (ref 0–55)
AST: 19 U/L (ref 5–34)
Albumin: 4 g/dL (ref 3.5–5.0)
Alkaline Phosphatase: 70 U/L (ref 40–150)
Anion Gap: 9 mEq/L (ref 3–11)
BUN: 11.5 mg/dL (ref 7.0–26.0)
CO2: 31 mEq/L — ABNORMAL HIGH (ref 22–29)
Calcium: 9.4 mg/dL (ref 8.4–10.4)
Chloride: 103 mEq/L (ref 98–109)
Creatinine: 0.8 mg/dL (ref 0.6–1.1)
EGFR: >90 mL/min/{1.73_m2} (ref >90)
Glucose: 132 mg/dl (ref 70–140)
Potassium: 3.5 mEq/L (ref 3.5–5.1)
Sodium: 143 mEq/L (ref 136–145)
Total Bilirubin: 0.35 mg/dL (ref 0.20–1.20)
Total Protein: 7.1 g/dL (ref 6.4–8.3)

## 2016-11-17 LAB — CBC WITH DIFFERENTIAL/PLATELET
BASO%: 0.3 % (ref 0.0–2.0)
Basophils Absolute: 0 10*3/uL (ref 0.0–0.1)
EOS%: 1.7 % (ref 0.0–7.0)
Eosinophils Absolute: 0.1 10*3/uL (ref 0.0–0.5)
HCT: 38.3 % (ref 34.8–46.6)
HGB: 12.8 g/dL (ref 11.6–15.9)
LYMPH%: 30.8 % (ref 14.0–49.7)
MCH: 30 pg (ref 25.1–34.0)
MCHC: 33.4 g/dL (ref 31.5–36.0)
MCV: 89.9 fL (ref 79.5–101.0)
MONO#: 0.3 10*3/uL (ref 0.1–0.9)
MONO%: 5.2 % (ref 0.0–14.0)
NEUT#: 3.7 10*3/uL (ref 1.5–6.5)
NEUT%: 62 % (ref 38.4–76.8)
Platelets: 273 10*3/uL (ref 145–400)
RBC: 4.26 10*6/uL (ref 3.70–5.45)
RDW: 13.7 % (ref 11.2–14.5)
WBC: 6 10*3/uL (ref 3.9–10.3)
lymph#: 1.9 10*3/uL (ref 0.9–3.3)
nRBC: 0 % (ref 0–0)

## 2016-11-17 NOTE — Progress Notes (Signed)
Received call from [facility] regarding a sleeve for patient.   Emailed Accounts [employer] to find out status of check. Called [facility] to see if check had been received. They asked that I call back tomorrow to speak with [facility] who handles the checks.  Contacted patient to advise of the follow up and that I would call her again tomorrow after speaking with Rose. Patient verbalized understanding.

## 2016-11-17 NOTE — Telephone Encounter (Signed)
Call placed to [facility] at [facility] with concern regarding Payment of sleeve. Pt has called [facility] and has been advised no payment for sleeve has been received.

## 2016-11-17 NOTE — Progress Notes (Signed)
[place]  [phone] [phone]   ID: [name] DOB: 10-06-1960  MR#: [id number]  GBT#:[id number]  Patient Care Team: Diane Morale, [name] as PCP - Delta III, [name] as Consulting Physician (General Surgery) Diane Cruel, [name] as Consulting Physician (Oncology) Diane Pita, [name] as Consulting Physician (Radiation Oncology) Diane Bloom, [name] (Anesthesiology) PCP: Diane Penna, [name] GYN: OTHER [name]: [name], Diane Bloom [name]  CHIEF COMPLAINT: Multicentric estrogen receptor positive breast cancer  CURRENT TREATMENT:  Anastrozole  BREAST CANCER HISTORY: From the original intake note:  [name] herself noted a change in her right breast in October and brought it to the attention of her primary care physician. Her last mammogram had been April 2010. [name] set [name] up for bilateral diagnostic mammography with tomosynthesis and [facility] November 04/11/2015. This found the breast density to be category B. In the right breast there were 3 microlobulated masses in the lower inner quadrant measuring 1.9, 1.5, and 1.5 cm. These were multicentric. There were also diffuse fine pleomorphic calcifications surrounding these masses, that area spanning 11.6 cm. The masses were palpable by exam at 4:00 8 cm from the nipple and at 5:00 3 cm from the nipple. By ultrasonography there was an irregular hypoechoic mass in the right breast at 4:00 measuring 1.7 cm, 1 medial to this measuring 1.1 cm, and then the third irregular hypoechoic mass at the 5:00 position measuring 1.3 cm. The right axilla showed multiple lymph nodes with thickened cortices. The largest lymph node measured 0.9 cm.  On 08/02/2015, [name] underwent biopsy of all 3 breast masses as well as a right axillary lymph node. 2 of the tumors were similar invasive ductal carcinomas, grade 2, estrogen receptor 95% positive, progesterone receptor 5%  positive, with an MIB-1 of 10%, and HER-2 equivocal, with a signals ratio of 1.30 and the number per cell 4.0. The third tumor appeared's grade 1 or 2 was also estrogen receptor positive at 95%, progesterone receptor positive at 5%, with an MIP-1 of 5%, and a similar HER-2 profile the signals ratio being 1.46 but the number per cell being only 3.95. By immunohistochemistry on this tumor HER-2 was negative at 1+. Immunohistochemistry on the equivocal reading is pending.  [name]'s subsequent history is as detailed below  INTERVAL HISTORY: [name] returns today for follow-up of her estrogen receptor positive breast cancer. She started anastrozole 08/22/2017. She is tolerating it well except for hot flashes. These are very bothersome to her area she tried the gabapentin at night but he gave her a headache. She tells me this has happened to her in the past. She only took the venlafaxine 1 time. She was scared because of the side effects. She actually did not have side effects from that single dose however. Aside from the hot flashes she does have multiple aches and pains here and there, which are chronic. She does not think she has any worse pain than she did before and her methadone and oxycodone prescriptions have not had to be changed by [name].   REVIEW OF SYSTEMS: [name] is participating in the finding your new normal group and getting a lot of good information out of it. She is also undergoing rehabilitation. She has very poor range of motion in her right upper extremity. The whole area still hurts quite a bit. She was prescribed a sleeve and a light agreed to pay for it but apparently that has not  yet gone through so she has not been able to obtain it. Aside from these issues a detailed review of systems today was stable  PAST MEDICAL HISTORY: Past Medical History:  Diagnosis Date  . Breast cancer ([place])   . Breast cancer of lower-inner quadrant of right female breast ([place]) 08/08/2015  . Colon  polyps   . DDD (degenerative disc disease), cervical   . Degenerative joint disease   . Diabetes mellitus without complication ([place])   . Diverticulosis   . Gallstones   . GERD (gastroesophageal reflux disease)   . Headache(784.0)    migraine like per patient  . Hypertension   . Low back pain    dr phillips, pain management  . Osteoarthritis     PAST SURGICAL HISTORY: Past Surgical History:  Procedure Laterality Date  . CHOLECYSTECTOMY  10/28/2011   Procedure: LAPAROSCOPIC CHOLECYSTECTOMY WITH INTRAOPERATIVE CHOLANGIOGRAM;  Surgeon: Diane Regal, [name];  Location: [facility];  Service: General;  Laterality: N/A;  . COLONOSCOPY WITH ESOPHAGOGASTRODUODENOSCOPY (EGD)  02/03/2013   with Propofol  . EXAM UNDER ANESTHESIA WITH MANIPULATION OF KNEE Right 05/30/2003  . EXAM UNDER ANESTHESIA WITH MANIPULATION OF KNEE Left 12/27/2002  . GANGLION CYST EXCISION Right    right wrist  . KNEE ARTHROSCOPY Bilateral 01/02/2000  . KNEE ARTHROSCOPY Right   . LAPAROSCOPIC VAGINAL HYSTERECTOMY WITH SALPINGO OOPHORECTOMY Bilateral 07/13/2006  . LIPOMA EXCISION Right 02/21/2008   hip  . MASS EXCISION  08/24/2012   Procedure: EXCISION MASS;  Surgeon: Diane Regal, [name];  Location: [facility];  Service: General;  Laterality: Left;  excisie soft tissue masses left shoulder(back) & left upper arm  . MASS EXCISION Left 01/24/2014   Procedure: EXCISION SOFT TISSUE MASSES LOWER LEFT BACK;  Surgeon: Diane Regal, [name];  Location: [facility];  Service: General;  Laterality: Left;  Marland Kitchen MASS EXCISION Right 04/30/2016   Procedure: EXCISION OF SKIN RIGHT CHEST WALL;  Surgeon: Diane Messing III, [name];  Location: [facility];  Service: General;  Laterality: Right;  EXCISION OF SKIN RIGHT CHEST WALL  . MASTECTOMY W/ SENTINEL NODE BIOPSY Right 10/01/2015   Procedure: RIGHT MASTECTOMY WITH SENTINEL LYMPH NODE BIOPSY;  Surgeon: Diane Messing III, [name];  Location: [facility];  Service: General;  Laterality: Right;   . PORT-A-CATH REMOVAL Left 04/30/2016   Procedure: REMOVAL PORT-A-CATH;  Surgeon: Diane Messing III, [name];  Location: [facility];  Service: General;  Laterality: Left;  REMOVAL PORT-A-CATH  . PORTACATH PLACEMENT Left 11/01/2015   Procedure: INSERTION PORT-A-CATH;  Surgeon: Diane Messing III, [name];  Location: [facility];  Service: General;  Laterality: Left;  . REPLACEMENT UNICONDYLAR JOINT KNEE Left 04/20/2001  . REVISION TOTAL KNEE ARTHROPLASTY Right 06/18/2004  . REVISION TOTAL KNEE ARTHROPLASTY Left 11/15/2002  . REVISION TOTAL KNEE ARTHROPLASTY Left 10/12/2001  . REVISION TOTAL KNEE ARTHROPLASTY Right 07/09/2015  . TONSILLECTOMY    . TOTAL KNEE ARTHROPLASTY Right 04/25/2003  . TOTAL KNEE ARTHROPLASTY Left 06/25/2009    FAMILY HISTORY Family History  Problem Relation Age of Onset  . Stomach cancer Mother   . Heart disease Father   . Prostate cancer Father   . Kidney cancer Sister     one out of the four  . Hypertension Sister   . Hypertension Brother   . Crohn's disease Other     nephew  . Colon cancer Maternal Grandmother   Ms. Bertagnolli is a real long radiation since she was coming to see  me today if she wanted to she could drop by downstairs would see her and she would like to review them does let them know that she is originally The patient's father died at the age of 35 with congestive heart failure. Her mother is still living as of November 2016, age 40. The patient had 6 brothers, 4 sisters. One sister was diagnosed with kidney cancer at age 34. ([place]). She is doing well. The patient's maternal grandmother was diagnosed with colon cancer at the age of 30. The patient's mother was diagnosed with a "rare stomach cancer" 20 years ago. The patient's father was diagnosed with prostate cancer at age 85. There is no history of breast or ovarian cancer in the family to her knowledge.   GYNECOLOGIC HISTORY:  No LMP recorded. Patient has had a hysterectomy.  menarche  age 89, first live birth age 39, the patient is GX P3. She underwent remote hysterectomy with bilateral salpingo-oophorectomy. She did not use hormone replacement. She never used oral contraceptives.   SOCIAL HISTORY:  [name] used to work at [employer] but she is now disabled because of her multiple arthritic and degenerative problems. Her husband Elberta Fortis A. Kushner works as a Freight forwarder. Son Dalphine Handing works in long care in [place]. Son Evette Doffing works for a Arboriculturist in [place]. Daughter  Silva Bandy is a Haematologist in [place]. The patient has 5 grandchildren. She attends a Physicist, medical church    ADVANCED DIRECTIVES: Not in place   HEALTH MAINTENANCE: Social History  Substance Use Topics  . Smoking status: Current Every Day Smoker    Packs/day: 0.25    Years: 20.00    Types: Cigarettes  . Smokeless tobacco: Former Systems developer    Quit date: 09/30/2015     Comment: smokes about 4 cigs/day; working on quitting - 08/22/16 gwd  . Alcohol use No     Colonoscopy:February 2016   WUJ:WJXBJY post hysterectomy   Bone density:  Lipid panel:  Allergies  Allergen Reactions  . Codeine Itching and Nausea And Vomiting    Itching all over the body  . Latex Hives  . Sulfonamide Derivatives Hives    All over the body    Current Outpatient Prescriptions  Medication Sig Dispense Refill  . anastrozole (ARIMIDEX) 1 MG tablet Take 1 tablet (1 mg total) by mouth daily. 30 tablet 3  . amitriptyline (ELAVIL) 25 MG tablet Take 25 mg by mouth at bedtime.     Marland Kitchen amLODipine (NORVASC) 5 MG tablet TAKE 1 TABLET(5 MG) BY MOUTH TWICE DAILY 180 tablet 3  . furosemide (LASIX) 20 MG tablet Take 1 tablet (20 mg total) by mouth daily. 90 tablet 3  . glucose blood (ONE TOUCH TEST STRIPS) test strip Dispense Ultra Mini, test once per day and code is E 11.9 100 each 1  . lisinopril (PRINIVIL,ZESTRIL) 10 MG tablet TAKE 1 TABLET(10 MG) BY MOUTH DAILY 90 tablet 0  .  LORazepam (ATIVAN) 1 MG tablet Take 1 mg by mouth every 8 (eight) hours.    . metFORMIN (GLUCOPHAGE) 500 MG tablet TAKE 1 TABLET(500 MG) BY MOUTH TWICE DAILY WITH A MEAL 180 tablet 0  . metFORMIN (GLUCOPHAGE) 500 MG tablet TAKE 1 TABLET(500 MG) BY MOUTH TWICE DAILY WITH A MEAL 180 tablet 1  . methadone (DOLOPHINE) 10 MG tablet Take 10 mg by mouth every 8 (eight) hours as needed for moderate pain.     . metoprolol (LOPRESSOR) 50 MG tablet TAKE 1 TABLET BY MOUTH  TWICE DAILY 180 tablet 1  . omeprazole (PRILOSEC) 40 MG capsule TAKE 1 CAPSULE(40 MG) BY MOUTH DAILY 90 capsule 1  . ONETOUCH DELICA LANCETS FINE MISC USE TO TEST ONCE DAILY 100 each 0  . oxyCODONE (ROXICODONE) 15 MG immediate release tablet Take 15 mg by mouth every 6 (six) hours as needed for pain. Reported on 12/10/2015    . potassium chloride (K-DUR,KLOR-CON) 10 MEQ tablet TAKE 2 TABLETS(20 MEQ) BY MOUTH TWICE DAILY 360 tablet 3  . venlafaxine XR (EFFEXOR-XR) 75 MG 24 hr capsule Take 1 capsule (75 mg total) by mouth daily with breakfast. 90 capsule 4  . zolpidem (AMBIEN) 10 MG tablet Take 1 tablet (10 mg total) by mouth at bedtime as needed for sleep. 30 tablet 5  . zolpidem (AMBIEN) 10 MG tablet Take 10 mg by mouth at bedtime as needed for sleep.     No current facility-administered medications for this visit.     OBJECTIVE: middle-aged African-American woman In no acute distress Vitals:   11/17/16 0933  BP: (!) 147/81  Pulse: 71  Resp: 20  Temp: 98.4 F (36.9 C)     Body mass index is 39.75 kg/m.    ECOG FS:1 - Symptomatic but completely ambulatory  Sclerae unicteric, pupils round and equal Oropharynx clear and moist-- no thrush or other lesions No cervical or supraclavicular adenopathy Lungs no rales or rhonchi Heart regular rate and rhythm Abd soft, nontender, positive bowel sounds MSK no focal spinal tenderness, very limited range of motion right upper extremity, only to about 30 Neuro: nonfocal, well oriented,  appropriate affect Breasts: The right breast is status post lumpectomy and radiation. There is significant hyperpigmentation. There is tenderness to palpation. There is no swelling or dehiscence. The right axilla is benign. The left breast is unremarkable.    LAB RESULTS:  CMP     Component Value Date/Time   NA 145 06/09/2016 1213   K 2.9 (LL) 06/09/2016 1213   CL 101 04/28/2016 1213   CO2 30 (H) 06/09/2016 1213   GLUCOSE 88 06/09/2016 1213   BUN 9.2 06/09/2016 1213   CREATININE 0.8 06/09/2016 1213   CALCIUM 9.5 06/09/2016 1213   PROT 7.4 06/09/2016 1213   ALBUMIN 4.0 06/09/2016 1213   AST 20 06/09/2016 1213   ALT 14 06/09/2016 1213   ALKPHOS 78 06/09/2016 1213   BILITOT 0.42 06/09/2016 1213   GFRNONAA >60 01/09/2016 1841   GFRAA >60 01/09/2016 1841    INo results found for: SPEP, UPEP  Lab Results  Component Value Date   WBC 6.0 11/17/2016   NEUTROABS 3.7 11/17/2016   HGB 12.8 11/17/2016   HCT 38.3 11/17/2016   MCV 89.9 11/17/2016   PLT 273 11/17/2016      Chemistry      Component Value Date/Time   NA 145 06/09/2016 1213   K 2.9 (LL) 06/09/2016 1213   CL 101 04/28/2016 1213   CO2 30 (H) 06/09/2016 1213   BUN 9.2 06/09/2016 1213   CREATININE 0.8 06/09/2016 1213      Component Value Date/Time   CALCIUM 9.5 06/09/2016 1213   ALKPHOS 78 06/09/2016 1213   AST 20 06/09/2016 1213   ALT 14 06/09/2016 1213   BILITOT 0.42 06/09/2016 1213       No results found for: LABCA2  No components found for: LABCA125  No results for input(s): INR in the last 168 hours.  Urinalysis    Component Value Date/Time   COLORURINE YELLOW 01/09/2016 1948  APPEARANCEUR CLEAR 01/09/2016 1948   LABSPEC 1.010 02/19/2016 1355   PHURINE 6.0 02/19/2016 1355   PHURINE 6.0 01/09/2016 1948   GLUCOSEU Negative 02/19/2016 1355   HGBUR Trace 02/19/2016 1355   HGBUR TRACE (A) 01/09/2016 1948   HGBUR large 07/01/2010 0945   BILIRUBINUR Negative 02/19/2016 1355   KETONESUR Negative  02/19/2016 1355   [place] 01/09/2016 1948   PROTEINUR Negative 02/19/2016 1355   PROTEINUR NEGATIVE 01/09/2016 1948   UROBILINOGEN 0.2 02/19/2016 1355   NITRITE Negative 02/19/2016 1355   NITRITE NEGATIVE 01/09/2016 1948   LEUKOCYTESUR Small 02/19/2016 1355    STUDIES: EXAM: DUAL X-RAY ABSORPTIOMETRY (DXA) FOR BONE MINERAL DENSITY  IMPRESSION: Referring Physician:  Chauncey [name]  PATIENT: Name: [name], [name] Patient ID: [id number] Birth Date: 18-Sep-1961 Height: 67.0 in. Sex: Female Measured: 10/02/2016 Weight: 251.6 lbs. Indications: Anastrazole, Bilateral Ovariectomy (65.51), Breast Cancer History, Estrogen Deficient, Hysterectomy, Low Calcium Intake (269.3), Omeprazole, Postmenopausal Fractures: None Treatments: Hormone Therapy For Cancer, Vitamin D (E933.5)  ASSESSMENT: The BMD measured at Femur Neck Left is 0.979 g/cm2 with a T-score of -0.4. This patient is considered normal according to [facility]) criteria.  Site Region Measured Date Measured Age YA BMD Significant CHANGE T-score DualFemur Neck Left 10/02/2016    56.8         -0.4    0.979 g/cm2  AP Spine  L1-L4     10/02/2016    56.8         0.2     1.225 g/cm2 ASSESSMENT: 57 y.o. [place] woman status post right breast lower inner quadrant biopsy 3 and axillary lymph node biopsy 08/02/2015 for a clinical mpT1c N0, stage IA invasive ductal carcinoma, grade 1 or 2, estrogen receptor 95% positive, progesterone receptor 5% positive, with an MIB-1 between 5 and 10%, and HER-2 equivocal on 1 of the 2 biopsies (signals ratio 1.30, number per cell 4.0).  (1) status post right modified radical mastectomy 10/01/2015 for an mpT2 pN0, stage IIA invasive ductal carcinoma, grade 2, with a total of 20 benign lymph nodes removed  (2) Oncotype DX score of 32 predicts an outside the breast recurrence risk of 22% within 10 years if the patient's only systemic therapy is tamoxifen for 5 years.  It also predicts significant benefit from adjuvant chemotherapy.  (3) patient completed adjuvant doxorubicin and cyclophosphamide in dose dense fashion 4, last dose 12/24/2015, followed by paclitaxel weekly starting 01/07/2016  (a) completed 10 of 12 planned paclitaxel doses, final 2 doses omitted because of neuropathy concerns    (4) postmastectomy radiation 06/02/16-07/17/16 1.  The right chest wall and supraclavicular region was treated to 50.4 Gy in 28 fractions of 1.8 Gy 2.  The mastectomy site was boosted to 60.4 Gy with 5 fractions of 2 Gy  (5) to start anastrozole 08/22/2016  (a) bone density 10/02/2016 shows a T score of -0.4, in the normal range.    PLAN: Kissa is tolerating anastrozole moderately well. The one big problem is hot flashes. She was not able to tolerate the gabapentin. I encouraged her to give the venlafaxine a second try. If she takes it twice this week and 3 times next week and perhaps she can move daily. I think after she has been on it 2 or 3 weeks she will notice a significant improvement in the hot flashes.  We are calling to find out what happened that she has not yet received her compression sleeve.  We reviewed the results of her  bone density scan which are normal. This is very encouraging.  We are going to see her again in 3 months just to make sure everything is going well. She will then see Dr. Marlou Starks again in August and she will see me again in November.  She knows to call for any problems that may develop before her next visit here.    Diane Cruel, [name]   11/17/2016 9:48 AM

## 2016-11-17 NOTE — Telephone Encounter (Signed)
[name] from [employer] advised payroll confirmed check was paid. Financial advised they will contact [facility] regarding status of sleeve. Pt has been made aware.

## 2016-11-18 ENCOUNTER — Encounter: Payer: Self-pay | Admitting: Oncology

## 2016-11-18 NOTE — Progress Notes (Signed)
Called [facility] back and spoke with correction to previous note([name]) to follow up on status of check for patient's sleeve. She searched and was able to locate the payment. I advised her that the patient was told that she could not take her sleeve with her until they received the payment. [name] wanted to know who told the patient that and advised the patient may pick up her sleeve. I advised her that I would find out.  I called the patient and asked her what happened when she went to pick up the sleeve and asked her to call and ask for [name] directly to provide this information to her. She verbalized understanding. I also advised her that her sleeve was ready to be picked up. Patient has my name and number for any additional financial questions or concerns.

## 2016-11-24 ENCOUNTER — Ambulatory Visit (INDEPENDENT_AMBULATORY_CARE_PROVIDER_SITE_OTHER): Payer: 59 | Admitting: Family Medicine

## 2016-11-24 ENCOUNTER — Encounter: Payer: Self-pay | Admitting: Family Medicine

## 2016-11-24 VITALS — BP 160/90 | Temp 98.7°F | Ht 67.0 in | Wt 253.0 lb

## 2016-11-24 DIAGNOSIS — M722 Plantar fascial fibromatosis: Secondary | ICD-10-CM

## 2016-11-24 MED ORDER — MELOXICAM 15 MG PO TABS: 15.0000 mg | ORAL_TABLET | Freq: Every day | ORAL | 2 refills | Status: DC

## 2016-11-24 NOTE — Progress Notes (Signed)
   Subjective:    Patient ID: [name], female    DOB: [birth year], 56 y.o.   MRN: [id number]  HPI Here for one week of sharp pain along the bottom of the left foot. No recent trauma. The worst pain is around the heel, but she has some pain around the forefoot as well.    Review of Systems  Constitutional: Negative.   Respiratory: Negative.   Cardiovascular: Negative.   Musculoskeletal: Positive for arthralgias.       Objective:   Physical Exam  Constitutional:  In pain, limping   Cardiovascular: Normal rate, regular rhythm, normal heart sounds and intact distal pulses.   Pulmonary/Chest: Effort normal and breath sounds normal.  Musculoskeletal:  Mildly tender around the 1st and 5th MTP heads on the left foot, she is very tender around the left heel. No swelling or warmth or erythema           Assessment & Plan:  Plantar fasciitis. Wear supportive shoes and use gel arch supports at all times. Try Meloxicam daily for a few weeks.  Alysia Penna, MD

## 2016-11-24 NOTE — Progress Notes (Signed)
Pre visit review using our clinic review tool, if applicable. No additional management support is needed unless otherwise documented below in the visit note. 

## 2016-11-24 NOTE — Patient Instructions (Signed)
WE NOW OFFER   [facility] FAST TRACK!!!  SAME DAY Appointments for ACUTE CARE  Such as: Sprains, Injuries, cuts, abrasions, rashes, muscle pain, joint pain, back pain Colds, flu, sore throats, headache, allergies, cough, fever  Ear pain, sinus and eye infections Abdominal pain, nausea, vomiting, diarrhea, upset stomach Animal/insect bites  3 Easy Ways to Schedule: Walk-In Scheduling Call in scheduling Mychart Sign-up: [email or web address]         

## 2016-12-05 DIAGNOSIS — Z79891 Long term (current) use of opiate analgesic: Secondary | ICD-10-CM | POA: Diagnosis not present

## 2016-12-05 DIAGNOSIS — G894 Chronic pain syndrome: Secondary | ICD-10-CM | POA: Diagnosis not present

## 2016-12-05 DIAGNOSIS — M150 Primary generalized (osteo)arthritis: Secondary | ICD-10-CM | POA: Diagnosis not present

## 2016-12-17 ENCOUNTER — Other Ambulatory Visit: Payer: Self-pay | Admitting: Oncology

## 2016-12-23 ENCOUNTER — Telehealth: Payer: Self-pay

## 2016-12-23 NOTE — Telephone Encounter (Signed)
This RN called pt and verified need to stop the anastrazole and to monitor for improvement in both her hand issues as well as the nausea.  Per discussion pt will call this RN on 4/9 with update and further discussion on plan for her history of breast cancer.  Note this RN informed pt per new diagnosis of plantar fascitis - not known to be associated with aromatase inhibitor use and likely coincidental.  [name] verbalized understanding.

## 2016-12-23 NOTE — Telephone Encounter (Signed)
Hand are hurting and swollen every morning. She runs them under hot water to get them moving. By noon they are no longer swollen. This has been going since end of February.  It is getting worse. 9/10 in the morning and goes away by noon as well. She is also fatigued as if she just took chemo. Is off chemo now.  She has been dx with plantar fascitis by Dr Sarajane Jews on 3/5.   She is also nauseated and would like some nausea medicine.   She was instructed to hold anastrazole until she hears from Dr Jana Hakim.

## 2016-12-29 ENCOUNTER — Telehealth: Payer: Self-pay | Admitting: *Deleted

## 2016-12-29 NOTE — Telephone Encounter (Signed)
This RN returned call to the patient - [name] states since stopping the anastrazole her hands are not better (tightness, swelling with discomfort arthritis like) - but nausea has resolved.  Pt has held her anastrazole for 1 week.  Per discussion pt will continue to hold the anastrazole until next Monday the 16th to see if hand symptoms improve.  No other needs at this time.

## 2016-12-30 ENCOUNTER — Other Ambulatory Visit: Payer: Self-pay | Admitting: Family Medicine

## 2017-01-04 IMAGING — CR DG ANKLE COMPLETE 3+V*L*
3 series · 3 of 3 positions shown · non-contrast
Comparison: Plain films left ankle 11/20/2014.

CLINICAL DATA: Left ankle pain, swelling and soreness for 2 weeks.
No known injury. Initial encounter.

EXAM:
LEFT ANKLE COMPLETE - 3+ VIEW

[x ankle ap left]
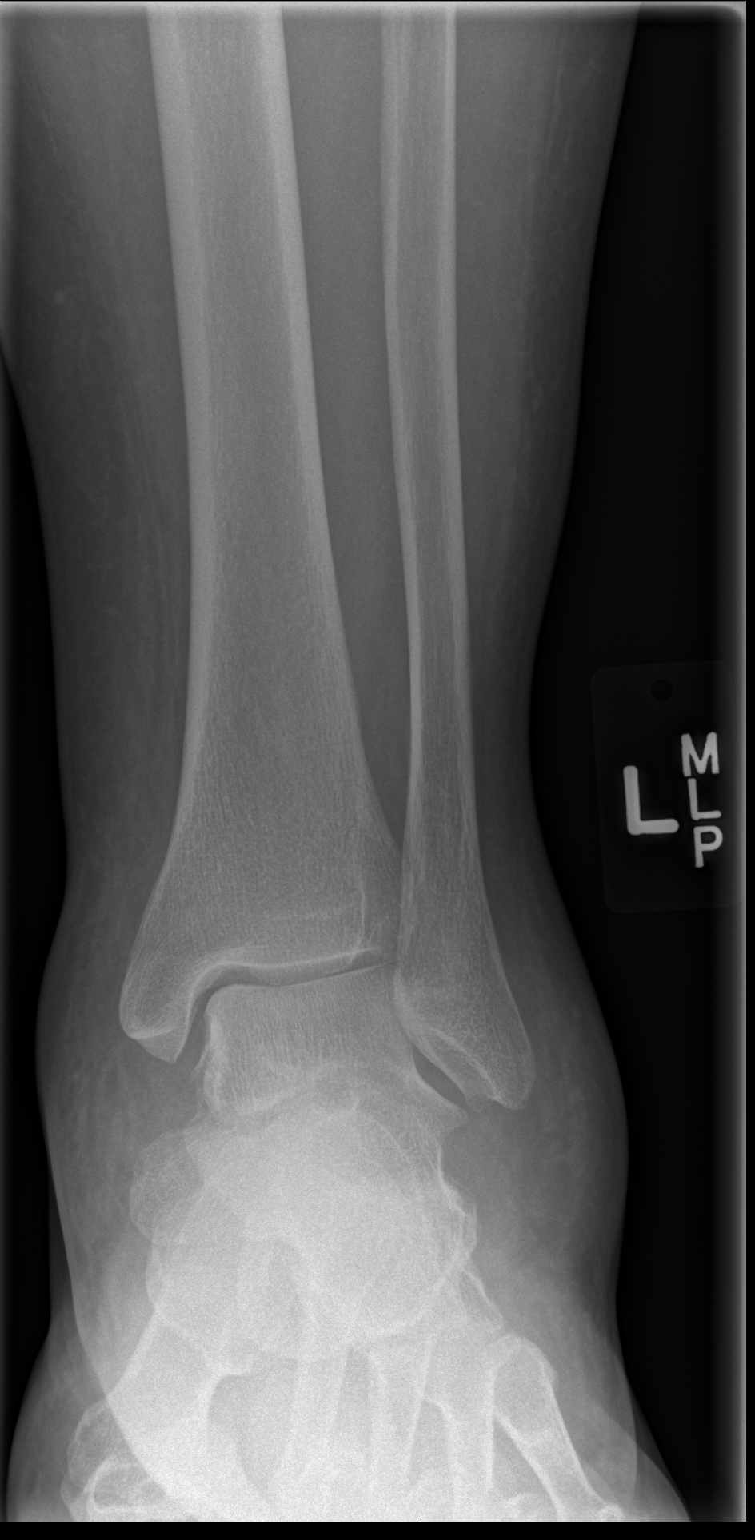

[x ankle obl left]
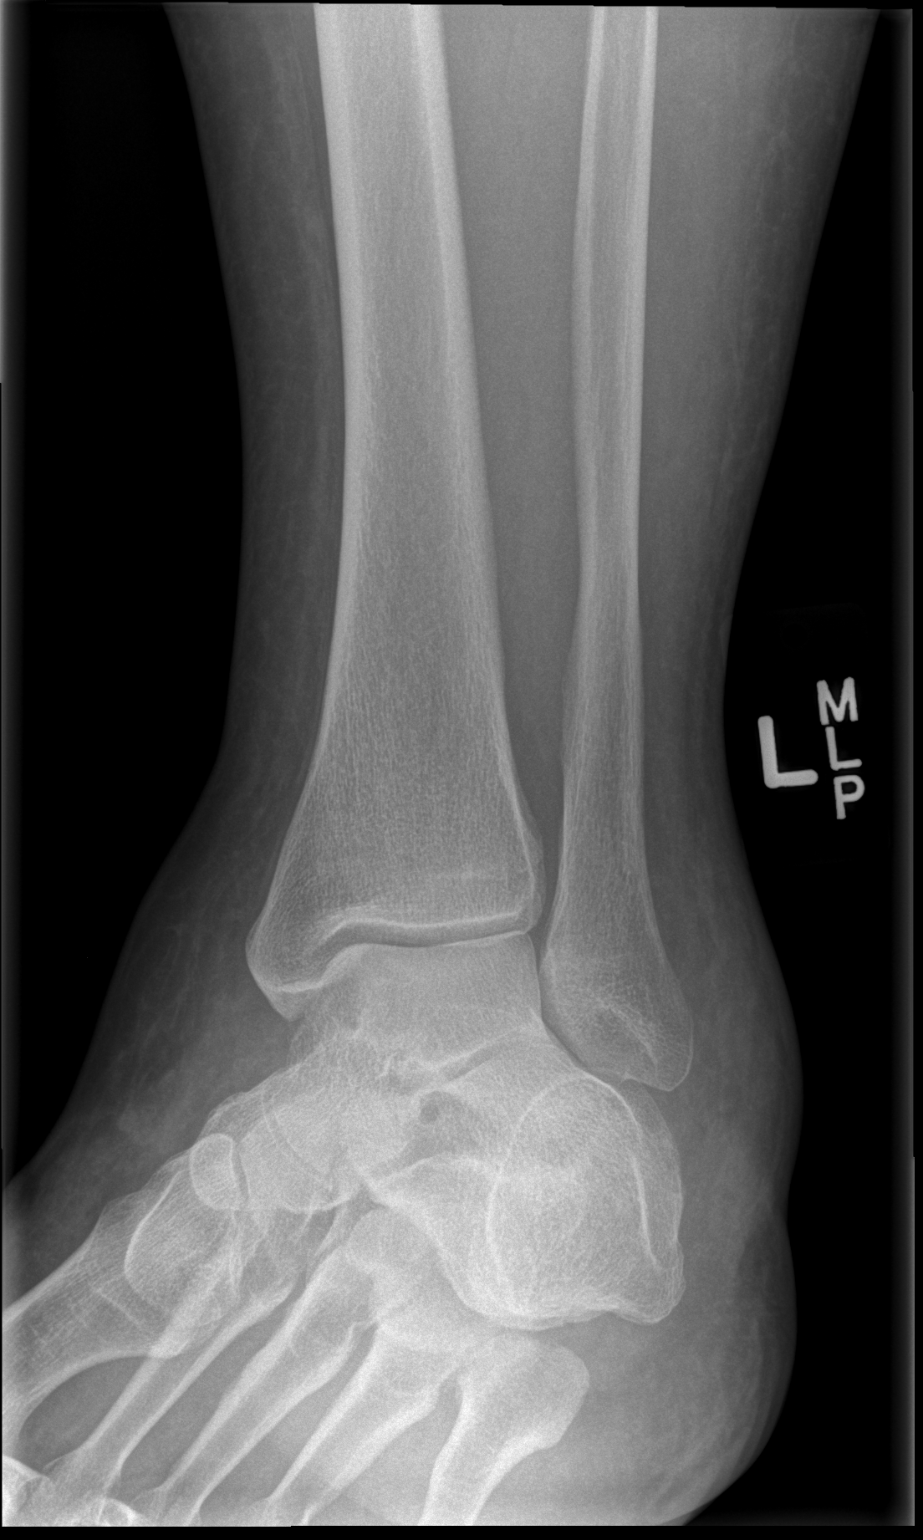

[x ankle lat left]
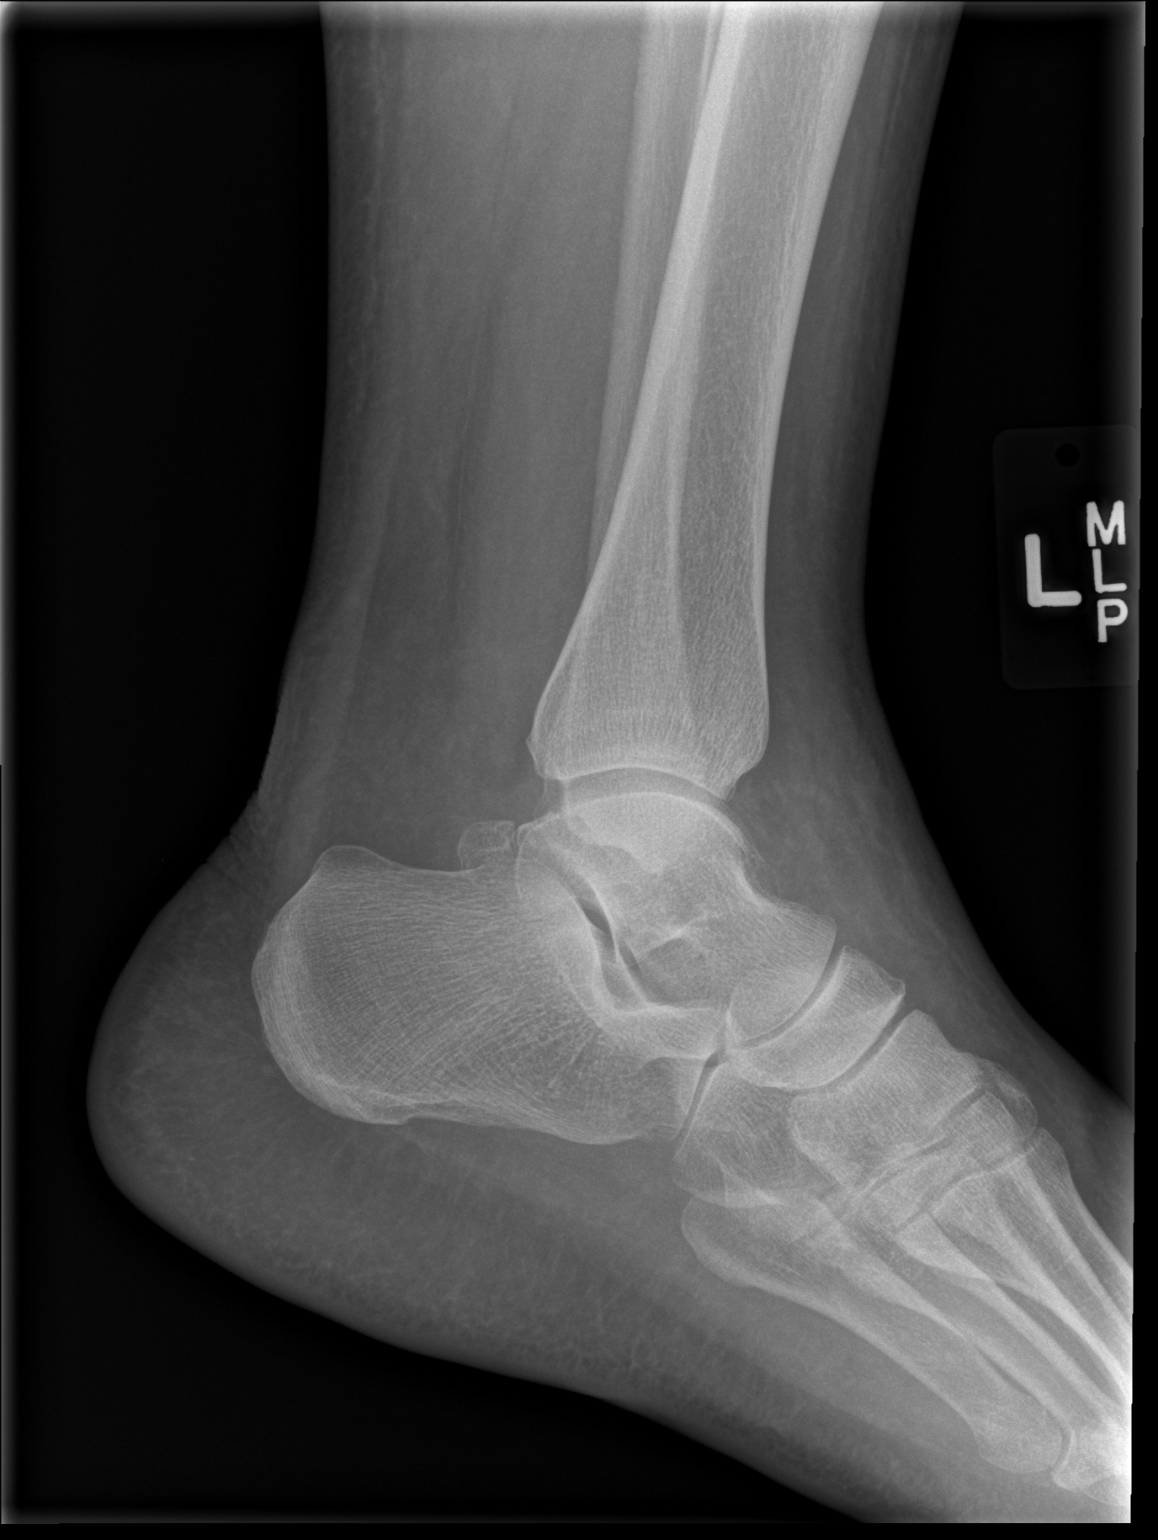

[3 of 3 positions shown; findings below may reference images not displayed]

FINDINGS: There is no evidence of fracture, dislocation, or joint effusion.
There is no evidence of arthropathy or other focal bone abnormality.
Soft tissues are unremarkable.
IMPRESSION: Negative exam.

## 2017-01-05 NOTE — Telephone Encounter (Signed)
Patient called and stated,"my hands are not any better. Feels more like arthritis. The nausea has resolved. What is the next step?" Informed her that I would talk with Dr. Jana Hakim, and call her back. She verbalized understanding.

## 2017-01-06 NOTE — Telephone Encounter (Signed)
Advised pt to continue to hold anastrazole and we will schedule her to see the NP within the next 2 weeks.

## 2017-01-12 ENCOUNTER — Ambulatory Visit (HOSPITAL_BASED_OUTPATIENT_CLINIC_OR_DEPARTMENT_OTHER): Payer: 59 | Admitting: Adult Health

## 2017-01-12 ENCOUNTER — Encounter: Payer: Self-pay | Admitting: Adult Health

## 2017-01-12 VITALS — BP 168/87 | HR 71 | Temp 98.1°F | Resp 18 | Ht 67.0 in | Wt 254.8 lb

## 2017-01-12 DIAGNOSIS — C773 Secondary and unspecified malignant neoplasm of axilla and upper limb lymph nodes: Secondary | ICD-10-CM | POA: Diagnosis not present

## 2017-01-12 DIAGNOSIS — C50311 Malignant neoplasm of lower-inner quadrant of right female breast: Secondary | ICD-10-CM | POA: Diagnosis not present

## 2017-01-12 DIAGNOSIS — Z170 Estrogen receptor positive status [ER+]: Secondary | ICD-10-CM

## 2017-01-12 DIAGNOSIS — M2550 Pain in unspecified joint: Secondary | ICD-10-CM

## 2017-01-12 MED ORDER — ONDANSETRON HCL 8 MG PO TABS: 4.0000 mg | ORAL_TABLET | Freq: Three times a day (TID) | ORAL | 0 refills | Status: DC | PRN

## 2017-01-12 NOTE — Progress Notes (Signed)
[place]  [phone] [phone]) [phone]   ID: [name] DOB: 12/31/1960  MR#: [id number]  YOK#:[id number]  Patient Care Team: Laurey Morale, MD as PCP - [place] III, MD as Consulting Physician (General Surgery) Chauncey Cruel, MD as Consulting Physician (Oncology) Tyler Pita, MD as Consulting Physician (Radiation Oncology) Nicholaus Bloom, MD (Anesthesiology) Gardenia Phlegm, NP as Nurse Practitioner (Hematology and Oncology) PCP: Alysia Penna, MD GYN: OTHER MD: Mardi Mainland MD, Nicholaus Bloom MD  CHIEF COMPLAINT: Multicentric estrogen receptor positive breast cancer  CURRENT TREATMENT:  Anastrozole  BREAST CANCER HISTORY: From the original intake note:  [name] herself noted a change in her right breast in October and brought it to the attention of her primary care physician. Her last mammogram had been April 2010. Dr. Sarajane Jews set [name] up for bilateral diagnostic mammography with tomosynthesis and [facility] 04/11/2015. This found the breast density to be category B. In the right breast there were 3 microlobulated masses in the lower inner quadrant measuring 1.9, 1.5, and 1.5 cm. These were multicentric. There were also diffuse fine pleomorphic calcifications surrounding these masses, that area spanning 11.6 cm. The masses were palpable by exam at 4:00 8 cm from the nipple and at 5:00 3 cm from the nipple. By ultrasonography there was an irregular hypoechoic mass in the right breast at 4:00 measuring 1.7 cm, 1 medial to this measuring 1.1 cm, and then the third irregular hypoechoic mass at the 5:00 position measuring 1.3 cm. The right axilla showed multiple lymph nodes with thickened cortices. The largest lymph node measured 0.9 cm.  On 08/02/2015, [name] underwent biopsy of all 3 breast masses as well as a right axillary lymph node. 2 of the tumors were similar invasive ductal  carcinomas, grade 2, estrogen receptor 95% positive, progesterone receptor 5% positive, with an MIB-1 of 10%, and HER-2 equivocal, with a signals ratio of 1.30 and the number per cell 4.0. The third tumor appeared's grade 1 or 2 was also estrogen receptor positive at 95%, progesterone receptor positive at 5%, with an MIP-1 of 5%, and a similar HER-2 profile the signals ratio being 1.46 but the number per cell being only 3.95. By immunohistochemistry on this tumor HER-2 was negative at 1+. Immunohistochemistry on the equivocal reading is pending.  Tyhesha's subsequent history is as detailed below  INTERVAL HISTORY: [name] is here today for evaluation after stopping anastrozole x 3 weeks due to arthralgias in her hands.  She notes no improvement in these arthralgias since stopping Anastrozole.  She does note that she was nauseated after taking Anastrozole and wants to know what to do about the nausea.  Otherwise, she is well and without questions or concerns.    REVIEW OF SYSTEMS: A detailed ROS was conducted and is non contributory except what is noted above.   PAST MEDICAL HISTORY: Past Medical History:  Diagnosis Date  . Breast cancer ([place])   . Breast cancer of lower-inner quadrant of right female breast ([place]) 08/08/2015  . Colon polyps   . DDD (degenerative disc disease), cervical   . Degenerative joint disease   . Diabetes mellitus without complication ([place])   . Diverticulosis   . Gallstones   . GERD (gastroesophageal reflux disease)   . Headache(784.0)    migraine like per patient  . Hypertension   . Low back pain    dr phillips, pain management  . Osteoarthritis     PAST  SURGICAL HISTORY: Past Surgical History:  Procedure Laterality Date  . CHOLECYSTECTOMY  10/28/2011   Procedure: LAPAROSCOPIC CHOLECYSTECTOMY WITH INTRAOPERATIVE CHOLANGIOGRAM;  Surgeon: Earnstine Regal, MD;  Location: [facility];  Service: General;  Laterality: N/A;  . COLONOSCOPY WITH ESOPHAGOGASTRODUODENOSCOPY (EGD)   02/03/2013   with Propofol  . EXAM UNDER ANESTHESIA WITH MANIPULATION OF KNEE Right 05/30/2003  . EXAM UNDER ANESTHESIA WITH MANIPULATION OF KNEE Left 12/27/2002  . GANGLION CYST EXCISION Right    right wrist  . KNEE ARTHROSCOPY Bilateral 01/02/2000  . KNEE ARTHROSCOPY Right   . LAPAROSCOPIC VAGINAL HYSTERECTOMY WITH SALPINGO OOPHORECTOMY Bilateral 07/13/2006  . LIPOMA EXCISION Right 02/21/2008   hip  . MASS EXCISION  08/24/2012   Procedure: EXCISION MASS;  Surgeon: Earnstine Regal, MD;  Location: [facility];  Service: General;  Laterality: Left;  excisie soft tissue masses left shoulder(back) & left upper arm  . MASS EXCISION Left 01/24/2014   Procedure: EXCISION SOFT TISSUE MASSES LOWER LEFT BACK;  Surgeon: Earnstine Regal, MD;  Location: [facility];  Service: General;  Laterality: Left;  Marland Kitchen MASS EXCISION Right 04/30/2016   Procedure: EXCISION OF SKIN RIGHT CHEST WALL;  Surgeon: Autumn Messing III, MD;  Location: [facility];  Service: General;  Laterality: Right;  EXCISION OF SKIN RIGHT CHEST WALL  . MASTECTOMY W/ SENTINEL NODE BIOPSY Right 10/01/2015   Procedure: RIGHT MASTECTOMY WITH SENTINEL LYMPH NODE BIOPSY;  Surgeon: Autumn Messing III, MD;  Location: [place] [facility];  Service: General;  Laterality: Right;  . PORT-A-CATH REMOVAL Left 04/30/2016   Procedure: REMOVAL PORT-A-CATH;  Surgeon: Autumn Messing III, MD;  Location: [facility] [facility];  Service: General;  Laterality: Left;  REMOVAL PORT-A-CATH  . PORTACATH PLACEMENT Left 11/01/2015   Procedure: INSERTION PORT-A-CATH;  Surgeon: Autumn Messing III, MD;  Location: [place] [facility];  Service: General;  Laterality: Left;  . REPLACEMENT UNICONDYLAR JOINT KNEE Left 04/20/2001  . REVISION TOTAL KNEE ARTHROPLASTY Right 06/18/2004  . REVISION TOTAL KNEE ARTHROPLASTY Left 11/15/2002  . REVISION TOTAL KNEE ARTHROPLASTY Left 10/12/2001  . REVISION TOTAL KNEE ARTHROPLASTY Right 07/09/2015  . TONSILLECTOMY    . TOTAL KNEE  ARTHROPLASTY Right 04/25/2003  . TOTAL KNEE ARTHROPLASTY Left 06/25/2009    FAMILY HISTORY Family History  Problem Relation Age of Onset  . Stomach cancer Mother   . Heart disease Father   . Prostate cancer Father   . Kidney cancer Sister     one out of the four  . Hypertension Sister   . Hypertension Brother   . Crohn's disease Other     nephew  . Colon cancer Maternal Grandmother   [name] is a real long radiation since she was coming to see me today if she wanted to she could drop by downstairs would see her and she would like to review them does let them know that she is originally The patient's father died at the age of 18 with congestive heart failure. Her mother is still living as of November 2016, age 28. The patient had 6 brothers, 4 sisters. One sister was diagnosed with kidney cancer at age 64. ([place]). She is doing well. The patient's maternal grandmother was diagnosed with colon cancer at the age of 48. The patient's mother was diagnosed with a "rare stomach cancer" 20 years ago. The patient's father was diagnosed with prostate cancer at age 65. There is no history of breast or ovarian cancer in the family to her knowledge.  GYNECOLOGIC HISTORY:  No LMP recorded. Patient has had a hysterectomy.  menarche age 19, first live birth age 89, the patient is GX P3. She underwent remote hysterectomy with bilateral salpingo-oophorectomy. She did not use hormone replacement. She never used oral contraceptives.   SOCIAL HISTORY:  [name] used to work at [employer] but she is now disabled because of her multiple arthritic and degenerative problems. Her husband [name] works as a Freight forwarder. Son [name] works in long care in [place]. Son [name] works for a Arboriculturist in [place]. Daughter  [name] is a Haematologist in [place]. The patient has 5 grandchildren. She attends a Physicist, medical church    ADVANCED  DIRECTIVES: Not in place   HEALTH MAINTENANCE: Social History  Substance Use Topics  . Smoking status: Current Every Day Smoker    Packs/day: 0.25    Years: 20.00    Types: Cigarettes  . Smokeless tobacco: Former Systems developer    Quit date: 09/30/2015     Comment: smokes about 4 cigs/day; working on quitting - 08/22/16 gwd  . Alcohol use No     Colonoscopy:February 2016   LKJ:ZPHXTA post hysterectomy   Bone density:  Lipid panel:  Allergies  Allergen Reactions  . Codeine Itching and Nausea And Vomiting    Itching all over the body  . Latex Hives  . Sulfonamide Derivatives Hives    All over the body    Current Outpatient Prescriptions  Medication Sig Dispense Refill  . amitriptyline (ELAVIL) 25 MG tablet Take 25 mg by mouth at bedtime.     Marland Kitchen amLODipine (NORVASC) 5 MG tablet TAKE 1 TABLET(5 MG) BY MOUTH TWICE DAILY 180 tablet 3  . furosemide (LASIX) 20 MG tablet Take 1 tablet (20 mg total) by mouth daily. 90 tablet 3  . lisinopril (PRINIVIL,ZESTRIL) 10 MG tablet TAKE 1 TABLET(10 MG) BY MOUTH DAILY 90 tablet 0  . LORazepam (ATIVAN) 1 MG tablet Take 1 mg by mouth every 8 (eight) hours.    . metFORMIN (GLUCOPHAGE) 500 MG tablet TAKE 1 TABLET(500 MG) BY MOUTH TWICE DAILY WITH A MEAL 180 tablet 0  . methadone (DOLOPHINE) 10 MG tablet Take 10 mg by mouth every 8 (eight) hours as needed for moderate pain.     . metoprolol (LOPRESSOR) 50 MG tablet TAKE 1 TABLET BY MOUTH TWICE DAILY 180 tablet 1  . ONE TOUCH ULTRA TEST test strip USE TO TEST ONCE DAILY 100 each 1  . ONETOUCH DELICA LANCETS FINE MISC USE TO TEST ONCE DAILY 100 each 0  . oxyCODONE (ROXICODONE) 15 MG immediate release tablet Take 15 mg by mouth every 6 (six) hours as needed for pain. Reported on 12/10/2015    . potassium chloride (K-DUR,KLOR-CON) 10 MEQ tablet TAKE 2 TABLETS(20 MEQ) BY MOUTH TWICE DAILY 360 tablet 3  . anastrozole (ARIMIDEX) 1 MG tablet TAKE 1 TABLET(1 MG) BY MOUTH DAILY (Patient not taking: Reported on 01/12/2017)  30 tablet 3  . omeprazole (PRILOSEC) 40 MG capsule TAKE 1 CAPSULE(40 MG) BY MOUTH DAILY 90 capsule 1  . ondansetron (ZOFRAN) 8 MG tablet Take 0.5-1 tablets (4-8 mg total) by mouth every 8 (eight) hours as needed for nausea or vomiting. 20 tablet 0   No current facility-administered medications for this visit.     OBJECTIVE:  Vitals:   01/12/17 1459  BP: (!) 168/87  Pulse: 71  Resp: 18  Temp: 98.1 F (36.7 C)     Body mass index is 39.91 kg/m.  ECOG FS:1 - Symptomatic but completely ambulatory  GENERAL: Patient is a well appearing female in no acute distress HEENT:  Sclerae anicteric.  Oropharynx clear and moist. No ulcerations or evidence of oropharyngeal candidiasis. Neck is supple.  NODES:  No cervical, supraclavicular, or axillary lymphadenopathy palpated.  BREAST EXAM:  Deferred. LUNGS:  Clear to auscultation bilaterally.  No wheezes or rhonchi. HEART:  Regular rate and rhythm. No murmur appreciated. ABDOMEN:  Soft, nontender.  Positive, normoactive bowel sounds. No organomegaly palpated. MSK:  No focal spinal tenderness to palpation. Full range of motion bilaterally in the upper extremities. EXTREMITIES:  No peripheral edema.   SKIN:  Clear with no obvious rashes or skin changes. No nail dyscrasia. NEURO:  Nonfocal. Well oriented.  Appropriate affect.     LAB RESULTS:  CMP     Component Value Date/Time   NA 143 11/17/2016 0922   K 3.5 11/17/2016 0922   CL 101 04/28/2016 1213   CO2 31 (H) 11/17/2016 0922   GLUCOSE 132 11/17/2016 0922   BUN 11.5 11/17/2016 0922   CREATININE 0.8 11/17/2016 0922   CALCIUM 9.4 11/17/2016 0922   PROT 7.1 11/17/2016 0922   ALBUMIN 4.0 11/17/2016 0922   AST 19 11/17/2016 0922   ALT 12 11/17/2016 0922   ALKPHOS 70 11/17/2016 0922   BILITOT 0.35 11/17/2016 0922   GFRNONAA >60 01/09/2016 1841   GFRAA >60 01/09/2016 1841    INo results found for: SPEP, UPEP  Lab Results  Component Value Date   WBC 6.0 11/17/2016   NEUTROABS 3.7  11/17/2016   HGB 12.8 11/17/2016   HCT 38.3 11/17/2016   MCV 89.9 11/17/2016   PLT 273 11/17/2016      Chemistry      Component Value Date/Time   NA 143 11/17/2016 0922   K 3.5 11/17/2016 0922   CL 101 04/28/2016 1213   CO2 31 (H) 11/17/2016 0922   BUN 11.5 11/17/2016 0922   CREATININE 0.8 11/17/2016 0922      Component Value Date/Time   CALCIUM 9.4 11/17/2016 0922   ALKPHOS 70 11/17/2016 0922   AST 19 11/17/2016 0922   ALT 12 11/17/2016 0922   BILITOT 0.35 11/17/2016 0922       No results found for: LABCA2  No components found for: [id number]  No results for input(s): INR in the last 168 hours.  Urinalysis    Component Value Date/Time   COLORURINE YELLOW 01/09/2016 1948   APPEARANCEUR CLEAR 01/09/2016 1948   LABSPEC 1.010 02/19/2016 1355   PHURINE 6.0 02/19/2016 1355   PHURINE 6.0 01/09/2016 1948   GLUCOSEU Negative 02/19/2016 1355   HGBUR Trace 02/19/2016 1355   HGBUR TRACE (A) 01/09/2016 1948   HGBUR large 07/01/2010 0945   BILIRUBINUR Negative 02/19/2016 1355   KETONESUR Negative 02/19/2016 1355   [place] 01/09/2016 1948   PROTEINUR Negative 02/19/2016 1355   PROTEINUR NEGATIVE 01/09/2016 1948   UROBILINOGEN 0.2 02/19/2016 1355   NITRITE Negative 02/19/2016 1355   NITRITE NEGATIVE 01/09/2016 1948   LEUKOCYTESUR Small 02/19/2016 1355    STUDIES: EXAM: DUAL X-RAY ABSORPTIOMETRY (DXA) FOR BONE MINERAL DENSITY  IMPRESSION: Referring Physician:  Chauncey Cruel  PATIENT: Name: [name], [name] Patient ID: [id number] Birth Date: 1960/10/06 Height: 67.0 in. Sex: Female Measured: 10/02/2016 Weight: 251.6 lbs. Indications: Anastrazole, Bilateral Ovariectomy (65.51), Breast Cancer History, Estrogen Deficient, Hysterectomy, Low Calcium Intake (269.3), Omeprazole, Postmenopausal Fractures: None Treatments: Hormone Therapy For Cancer, Vitamin D (E933.5)  ASSESSMENT: The BMD measured at Femur  Neck Left is 0.979 g/cm2 with a T-score of -0.4.  This patient is considered normal according to [facility]) criteria.  Site Region Measured Date Measured Age YA BMD Significant CHANGE T-score DualFemur Neck Left 10/02/2016    56.8         -0.4    0.979 g/cm2  AP Spine  L1-L4     10/02/2016    56.8         0.2     1.225 g/cm2 ASSESSMENT: 56 y.o. [name] status post right breast lower inner quadrant biopsy 3 and axillary lymph node biopsy 08/02/2015 for a clinical mpT1c N0, stage IA invasive ductal carcinoma, grade 1 or 2, estrogen receptor 95% positive, progesterone receptor 5% positive, with an MIB-1 between 5 and 10%, and HER-2 equivocal on 1 of the 2 biopsies (signals ratio 1.30, number per cell 4.0).  (1) status post right modified radical mastectomy 10/01/2015 for an mpT2 pN0, stage IIA invasive ductal carcinoma, grade 2, with a total of 20 benign lymph nodes removed  (2) Oncotype DX score of 32 predicts an outside the breast recurrence risk of 22% within 10 years if the patient's only systemic therapy is tamoxifen for 5 years. It also predicts significant benefit from adjuvant chemotherapy.  (3) patient completed adjuvant doxorubicin and cyclophosphamide in dose dense fashion 4, last dose 12/24/2015, followed by paclitaxel weekly starting 01/07/2016  (a) completed 10 of 12 planned paclitaxel doses, final 2 doses omitted because of neuropathy concerns    (4) postmastectomy radiation 06/02/16-07/17/16 1.  The right chest wall and supraclavicular region was treated to 50.4 Gy in 28 fractions of 1.8 Gy 2.  The mastectomy site was boosted to 60.4 Gy with 5 fractions of 2 Gy  (5) to start anastrozole 08/22/2016  (a) bone density 10/02/2016 shows a T score of -0.4, in the normal range.    PLAN:  Rhealynn will restart her anastrozole.  She will take at night to see if that will help decrease her nausea.  I have also sent in a script for Ondansetron for her to take if needed for nausea.  She knows to call if  she needs [place] or if the nausea is intolerable.     She will then see Dr. Marlou Starks again in August and Dr. Jana Hakim in November.  Her appt with Dr. Marlou Starks isn't scheduled yet, and I encouraged her to do so.    A total of (20) minutes of face-to-face time was spent with this patient with greater than 50% of that time in counseling and care-coordination.    Scot Dock, NP   01/12/2017 3:26 PM

## 2017-01-16 ENCOUNTER — Ambulatory Visit: Payer: 59 | Admitting: Adult Health

## 2017-01-20 ENCOUNTER — Other Ambulatory Visit: Payer: Self-pay | Admitting: Family Medicine

## 2017-01-20 NOTE — Telephone Encounter (Signed)
Call in #[phone] with 5 rf 

## 2017-02-06 ENCOUNTER — Encounter: Payer: Self-pay | Admitting: Family Medicine

## 2017-02-06 ENCOUNTER — Ambulatory Visit (INDEPENDENT_AMBULATORY_CARE_PROVIDER_SITE_OTHER): Payer: 59 | Admitting: Family Medicine

## 2017-02-06 VITALS — BP 168/98 | HR 76 | Temp 98.6°F | Ht 67.0 in | Wt 252.0 lb

## 2017-02-06 DIAGNOSIS — M1000 Idiopathic gout, unspecified site: Secondary | ICD-10-CM | POA: Diagnosis not present

## 2017-02-06 MED ORDER — PREDNISONE 10 MG PO TABS: ORAL_TABLET | ORAL | 0 refills | Status: DC

## 2017-02-06 NOTE — Progress Notes (Signed)
   Subjective:    Patient ID: [name], female    DOB: 05/23/1961, 56 y.o.   MRN: [id number]  HPI Here for 5 days of swelling and extreme pain in the left foot, primarily in the great toe and the lateral ankle. No recent trauma. She is taking her usual pain medications with no relief. She does have a hx of gout some years ago,    Review of Systems  Constitutional: Negative.   Musculoskeletal: Positive for arthralgias, gait problem and joint swelling.       Objective:   Physical Exam  Constitutional:  In pain, walking with a cane   Musculoskeletal:  The entire left foot is swollen, warm, and pink. She is very tender at the base of the great toe and at the lateral malleolus.           Assessment & Plan:  Gout attack. She will stay off the foot and keep it elevated. Given a Prednisone taper from 50 mg a day down over 15 days. Recheck prn.  Alysia Penna, MD

## 2017-02-09 ENCOUNTER — Other Ambulatory Visit: Payer: 59

## 2017-02-09 ENCOUNTER — Ambulatory Visit: Payer: 59 | Admitting: Adult Health

## 2017-02-21 ENCOUNTER — Emergency Department (HOSPITAL_COMMUNITY)
Admission: EM | Admit: 2017-02-21 | Discharge: 2017-02-21 | Disposition: A | Discharge status: Home / Self Care | Payer: 59 | Attending: Emergency Medicine | Admitting: Emergency Medicine

## 2017-02-21 ENCOUNTER — Encounter (HOSPITAL_COMMUNITY): Payer: Self-pay

## 2017-02-21 ENCOUNTER — Emergency Department (HOSPITAL_BASED_OUTPATIENT_CLINIC_OR_DEPARTMENT_OTHER): Admit: 2017-02-21 | Discharge: 2017-02-21 | Disposition: A | Discharge status: Home / Self Care | Payer: 59

## 2017-02-21 ENCOUNTER — Emergency Department (HOSPITAL_COMMUNITY): Payer: 59

## 2017-02-21 DIAGNOSIS — M79609 Pain in unspecified limb: Secondary | ICD-10-CM

## 2017-02-21 DIAGNOSIS — M792 Neuralgia and neuritis, unspecified: Secondary | ICD-10-CM | POA: Diagnosis not present

## 2017-02-21 DIAGNOSIS — G8929 Other chronic pain: Secondary | ICD-10-CM | POA: Diagnosis not present

## 2017-02-21 DIAGNOSIS — Z7984 Long term (current) use of oral hypoglycemic drugs: Secondary | ICD-10-CM | POA: Diagnosis not present

## 2017-02-21 DIAGNOSIS — Z720 Tobacco use: Secondary | ICD-10-CM

## 2017-02-21 DIAGNOSIS — M25572 Pain in left ankle and joints of left foot: Secondary | ICD-10-CM | POA: Diagnosis not present

## 2017-02-21 DIAGNOSIS — F17210 Nicotine dependence, cigarettes, uncomplicated: Secondary | ICD-10-CM | POA: Insufficient documentation

## 2017-02-21 DIAGNOSIS — I10 Essential (primary) hypertension: Secondary | ICD-10-CM | POA: Insufficient documentation

## 2017-02-21 DIAGNOSIS — Z79899 Other long term (current) drug therapy: Secondary | ICD-10-CM | POA: Diagnosis not present

## 2017-02-21 DIAGNOSIS — E119 Type 2 diabetes mellitus without complications: Secondary | ICD-10-CM | POA: Diagnosis not present

## 2017-02-21 DIAGNOSIS — M79672 Pain in left foot: Secondary | ICD-10-CM

## 2017-02-21 DIAGNOSIS — Z853 Personal history of malignant neoplasm of breast: Secondary | ICD-10-CM | POA: Diagnosis not present

## 2017-02-21 DIAGNOSIS — M13872 Other specified arthritis, left ankle and foot: Secondary | ICD-10-CM | POA: Diagnosis not present

## 2017-02-21 DIAGNOSIS — M7989 Other specified soft tissue disorders: Secondary | ICD-10-CM | POA: Diagnosis not present

## 2017-02-21 DIAGNOSIS — M19072 Primary osteoarthritis, left ankle and foot: Secondary | ICD-10-CM

## 2017-02-21 NOTE — Progress Notes (Signed)
*  PRELIMINARY RESULTS* Vascular Ultrasound Left lower extremity venous duplex has been completed.  Preliminary findings: No evidence of DVT or baker's cyst.    Landry Mellow, RDMS, RVT  02/21/2017, 7:32 PM

## 2017-02-21 NOTE — ED Provider Notes (Signed)
[facility] DEPT Provider Note   CSN: [id number] Arrival date & time: 02/21/17  1446     History   Chief Complaint Chief Complaint  Patient presents with  . Foot Pain    HPI [name] is a 56 y.o. female with a PMHx of breast cancer s/p chemo and radiation currently in remission x80mos, DDD, DJD, DM2, diverticulosis, GERD, HTN, gout, and chronic low back pain, who presents to the ED with complaints of 5 months of gradually worsening left ankle and foot pain and swelling. She states that last year when she finished chemotherapy she had and early because she developed neuropathy in her feet, she tried gabapentin but this caused side effects so she stopped taking it. Her pain doctor thought that perhaps the neuropathy was causing her pain. Over the last several months the pain is worsened, she saw her PCP on 11/24/16 and they thought it could be plantar fasciitis so they prescribed her Mobic which she didn't take because it upsets her stomach. She saw her PCP again on 02/06/17 and they thought it could be gout, so she was given a prednisone taper for 15 days which she stopped taking early because she didn't feel like it was helping and her BP was going up from it. She continues to have pain so she presented to the ER for further evaluation. She describes the pain is 8/10 constant aching throbbing and occasionally sharp nonradiating left foot pain primarily in the first and fourth toes and along the lateral foot and ankle, worse with walking, mildly improved with her home methadone and Roxicodone, and unrelieved with the prednisone she was prescribed recently. She reports associated paresthesias as well as swelling that seems to improve with elevation but worsened when she has her feet down. She admits to being a cigarette smoker. She states that she had a DVT in 1981 after she gave birth to her child, but has not had any further DVTs and is no longer on anticoagulation. She does have a strong family  history of DVTs. She denies any injuries or falls, does not recall doing anything to the foot or ankle that would cause her symptoms. She also denies redness/warmth/bruising to the area, calf swelling, fevers, chills, CP, SOB, recent travel/surgery/immobilization, estrogen use, abd pain, N/V/D/C, hematuria, dysuria, myalgias, focal weakness, or any other complaints at this time.    The history is provided by the patient and medical records. No language interpreter was used.  Foot Pain  This is a recurrent problem. The current episode started more than 1 week ago. The problem occurs constantly. The problem has been gradually worsening. Pertinent negatives include no chest pain, no abdominal pain and no shortness of breath. The symptoms are aggravated by walking. The symptoms are relieved by narcotics. Treatments tried: methadone, roxycodone, prednisone. The treatment provided mild relief.    Past Medical History:  Diagnosis Date  . Breast cancer ([place])   . Breast cancer of lower-inner quadrant of right female breast ([place]) 08/08/2015  . Colon polyps   . DDD (degenerative disc disease), cervical   . Degenerative joint disease   . Diabetes mellitus without complication ([place])   . Diverticulosis   . Gallstones   . GERD (gastroesophageal reflux disease)   . Headache(784.0)    migraine like per patient  . Hypertension   . Low back pain    dr phillips, pain management  . Osteoarthritis     Patient Active Problem List   Diagnosis Date Noted  .  Insomnia 07/16/2016  . Left groin pain 11/30/2015  . Severe obesity (BMI >= 40) ([place]) 10/26/2015  . Malignant neoplasm of lower-inner quadrant of right breast of female, estrogen receptor positive ([place]) 08/08/2015  . Diabetes mellitus without complication ([place]) 16/06/9603  . Paraspinous mass, left 03/31/2013  . Internal hemorrhoid 02/17/2013  . Abdominal pain, other specified site 02/17/2013  . Neoplasm of soft tissue, left shoulder and left upper arm  08/03/2012  . Lightheadedness 01/29/2012  . Cholelithiasis with cholecystitis 10/08/2011  . Hypokalemia 09/17/2011  . GERD 08/08/2010  . GOUT 12/18/2008  . Essential hypertension 03/15/2007  . HEADACHE 03/15/2007    Past Surgical History:  Procedure Laterality Date  . CHOLECYSTECTOMY  10/28/2011   Procedure: LAPAROSCOPIC CHOLECYSTECTOMY WITH INTRAOPERATIVE CHOLANGIOGRAM;  Surgeon: Earnstine Regal, MD;  Location: [facility];  Service: General;  Laterality: N/A;  . COLONOSCOPY WITH ESOPHAGOGASTRODUODENOSCOPY (EGD)  02/03/2013   with Propofol  . EXAM UNDER ANESTHESIA WITH MANIPULATION OF KNEE Right 05/30/2003  . EXAM UNDER ANESTHESIA WITH MANIPULATION OF KNEE Left 12/27/2002  . GANGLION CYST EXCISION Right    right wrist  . KNEE ARTHROSCOPY Bilateral 01/02/2000  . KNEE ARTHROSCOPY Right   . LAPAROSCOPIC VAGINAL HYSTERECTOMY WITH SALPINGO OOPHORECTOMY Bilateral 07/13/2006  . LIPOMA EXCISION Right 02/21/2008   hip  . MASS EXCISION  08/24/2012   Procedure: EXCISION MASS;  Surgeon: Earnstine Regal, MD;  Location: [facility];  Service: General;  Laterality: Left;  excisie soft tissue masses left shoulder(back) & left upper arm  . MASS EXCISION Left 01/24/2014   Procedure: EXCISION SOFT TISSUE MASSES LOWER LEFT BACK;  Surgeon: Earnstine Regal, MD;  Location: [facility];  Service: General;  Laterality: Left;  Marland Kitchen MASS EXCISION Right 04/30/2016   Procedure: EXCISION OF SKIN RIGHT CHEST WALL;  Surgeon: Autumn Messing III, MD;  Location: [facility];  Service: General;  Laterality: Right;  EXCISION OF SKIN RIGHT CHEST WALL  . MASTECTOMY W/ SENTINEL NODE BIOPSY Right 10/01/2015   Procedure: RIGHT MASTECTOMY WITH SENTINEL LYMPH NODE BIOPSY;  Surgeon: Autumn Messing III, MD;  Location: [facility];  Service: General;  Laterality: Right;  . PORT-A-CATH REMOVAL Left 04/30/2016   Procedure: REMOVAL PORT-A-CATH;  Surgeon: Autumn Messing III, MD;  Location: [facility];  Service: General;   Laterality: Left;  REMOVAL PORT-A-CATH  . PORTACATH PLACEMENT Left 11/01/2015   Procedure: INSERTION PORT-A-CATH;  Surgeon: Autumn Messing III, MD;  Location: [facility];  Service: General;  Laterality: Left;  . REPLACEMENT UNICONDYLAR JOINT KNEE Left 04/20/2001  . REVISION TOTAL KNEE ARTHROPLASTY Right 06/18/2004  . REVISION TOTAL KNEE ARTHROPLASTY Left 11/15/2002  . REVISION TOTAL KNEE ARTHROPLASTY Left 10/12/2001  . REVISION TOTAL KNEE ARTHROPLASTY Right 07/09/2015  . TONSILLECTOMY    . TOTAL KNEE ARTHROPLASTY Right 04/25/2003  . TOTAL KNEE ARTHROPLASTY Left 06/25/2009    OB History    No data available       Home Medications    Prior to Admission medications   Medication Sig Start Date End Date Taking? Authorizing Provider  amitriptyline (ELAVIL) 25 MG tablet Take 25 mg by mouth at bedtime.     [provider]  amLODipine (NORVASC) 5 MG tablet TAKE 1 TABLET(5 MG) BY MOUTH TWICE DAILY 04/07/16   Laurey Morale, MD  anastrozole (ARIMIDEX) 1 MG tablet TAKE 1 TABLET(1 MG) BY MOUTH DAILY 12/17/16   [name], Virgie Dad, MD  furosemide (LASIX) 20 MG tablet Take 1 tablet (20 mg  total) by mouth daily. 04/07/16   Laurey Morale, MD  lisinopril (PRINIVIL,ZESTRIL) 10 MG tablet TAKE 1 TABLET(10 MG) BY MOUTH DAILY 06/02/16   Laurey Morale, MD  LORazepam (ATIVAN) 1 MG tablet Take 1 mg by mouth every 8 (eight) hours.    [provider]  meloxicam (MOBIC) 15 MG tablet Take 1 tablet by mouth daily. 11/24/16   [provider]  metFORMIN (GLUCOPHAGE) 500 MG tablet TAKE 1 TABLET(500 MG) BY MOUTH TWICE DAILY WITH A MEAL 04/28/16   Laurey Morale, MD  methadone (DOLOPHINE) 10 MG tablet Take 10 mg by mouth every 8 (eight) hours as needed for moderate pain.     [provider]  metoprolol (LOPRESSOR) 50 MG tablet TAKE 1 TABLET BY MOUTH TWICE DAILY 09/30/16   Laurey Morale, MD  omeprazole (PRILOSEC) 40 MG capsule TAKE 1 CAPSULE(40 MG) BY MOUTH DAILY 05/30/16   Laurey Morale, MD  ondansetron (ZOFRAN) 8 MG tablet Take 0.5-1 tablets (4-8 mg total) by mouth every 8 (eight) hours as needed for nausea or vomiting. 01/12/17   Gardenia Phlegm, NP  ONE [facility] ULTRA TEST test strip USE TO TEST ONCE DAILY 12/30/16   Laurey Morale, MD  [facility] DELICA LANCETS FINE MISC USE TO TEST ONCE DAILY 08/11/16   Laurey Morale, MD  oxyCODONE (ROXICODONE) 15 MG immediate release tablet Take 15 mg by mouth every 6 (six) hours as needed for pain. Reported on 12/10/2015 11/28/15   [provider]  potassium chloride (K-DUR,KLOR-CON) 10 MEQ tablet TAKE 2 TABLETS(20 MEQ) BY MOUTH TWICE DAILY 06/30/16   Laurey Morale, MD  predniSONE (DELTASONE) 10 MG tablet Take 5 tabs a day for 3 days, then 4 tabs a day for 3 days, then 3 tabs a day for 3 days, then 2 tabs a day for 3 days, then 1 tab a day for 3 days, then stop 02/06/17   Laurey Morale, MD  zolpidem (AMBIEN) 10 MG tablet TAKE 1 TABLET BY MOUTH AT BEDTIME AS NEEDED FOR SLEEP 01/20/17   Laurey Morale, MD    Family History Family History  Problem Relation Age of Onset  . Stomach cancer Mother   . Heart disease Father   . Prostate cancer Father   . Kidney cancer Sister        one out of the four  . Hypertension Sister   . Hypertension Brother   . Crohn's disease Other        nephew  . Colon cancer Maternal Grandmother     Social History Social History  Substance Use Topics  . Smoking status: Current Every Day Smoker    Packs/day: 0.25    Years: 20.00    Types: Cigarettes  . Smokeless tobacco: Former Systems developer    Quit date: 09/30/2015     Comment: smokes about 4 cigs/day; working on quitting - 08/22/16 gwd  . Alcohol use No     Allergies   Codeine; Latex; and Sulfonamide derivatives   Review of Systems Review of Systems  Constitutional: Negative for chills and fever.  Respiratory: Negative for shortness of breath.   Cardiovascular: Negative for chest pain and leg swelling (no calf swelling).  Gastrointestinal:  Negative for abdominal pain, constipation, diarrhea, nausea and vomiting.  Genitourinary: Negative for dysuria and hematuria.  Musculoskeletal: Positive for arthralgias and joint swelling. Negative for myalgias.  Skin: Negative for color change.  Allergic/Immunologic: Positive for immunocompromised state (DM2).  Neurological: Negative for weakness  and numbness.  Psychiatric/Behavioral: Negative for confusion.   All other systems reviewed and are negative for acute change except as noted in the HPI.    Physical Exam Updated Vital Signs BP (!) 158/88 (BP Location: Left Arm)   Pulse 74   Temp 98 F (36.7 C) (Oral)   Resp 18   SpO2 98%   Physical Exam  Constitutional: She is oriented to person, place, and time. Vital signs are normal. She appears well-developed and well-nourished.  Non-toxic appearance. No distress.  Afebrile, nontoxic, NAD  HENT:  Head: Normocephalic and atraumatic.  Mouth/Throat: Oropharynx is clear and moist and mucous membranes are normal.  Eyes: Conjunctivae and EOM are normal. Right eye exhibits no discharge. Left eye exhibits no discharge.  Neck: Normal range of motion. Neck supple.  Cardiovascular: Normal rate, regular rhythm, normal heart sounds and intact distal pulses.  Exam reveals no gallop and no friction rub.   No murmur heard. Pulmonary/Chest: Effort normal and breath sounds normal. No respiratory distress. She has no decreased breath sounds. She has no wheezes. She has no rhonchi. She has no rales.  Abdominal: Soft. Normal appearance and bowel sounds are normal. She exhibits no distension. There is no tenderness. There is no rigidity, no rebound, no guarding, no CVA tenderness, no tenderness at McBurney's point and negative Murphy's sign.  Musculoskeletal: Normal range of motion.       Left ankle: She exhibits swelling. She exhibits normal range of motion, no ecchymosis, no deformity, no laceration and normal pulse. Tenderness. Lateral malleolus  tenderness found. Achilles tendon normal.       Feet:  L foot and ankle with FROM intact, with very marginal swelling to the lateral malleolus and lateral foot but not much more than compared to right side, no pitting edema, no crepitus or deformity, with mild TTP of the lateral malleolus and lateral foot extending into the 1st and 4th toes, but no TTP or swelling of calf. No break in skin. No bruising or erythema. No warmth. Achilles intact. Good pedal pulse and cap refill of all toes. Wiggling toes without difficulty. Sensation grossly intact. Soft compartments   Neurological: She is alert and oriented to person, place, and time. She has normal strength. No sensory deficit.  Skin: Skin is warm, dry and intact. No rash noted.  Psychiatric: She has a normal mood and affect.  Nursing note and vitals reviewed.    ED Treatments / Results  Labs (all labs ordered are listed, but only abnormal results are displayed) Labs Reviewed - No data to display  EKG  EKG Interpretation None       Radiology Dg Ankle Complete Left  Result Date: 02/21/2017 CLINICAL DATA:  Left ankle pain and swelling for 1 month. No known injury. EXAM: LEFT ANKLE COMPLETE - 3+ VIEW COMPARISON:  None. FINDINGS: There is no evidence of fracture, dislocation, or joint effusion. There is no evidence of arthropathy or other focal bone abnormality. Soft tissues are unremarkable. IMPRESSION: Negative. Electronically Signed   By: Earle Gell M.D.   On: 02/21/2017 18:44   Dg Foot Complete Left  Result Date: 02/21/2017 CLINICAL DATA:  Ankle foot pain and swelling for 1 month EXAM: LEFT FOOT - COMPLETE 3+ VIEW COMPARISON:  None. FINDINGS: No acute fracture or malalignment. Mild osteopenia. Minimal degenerative changes at the first MTP joint. Diffuse soft tissue swelling. IMPRESSION: Mild demineralization.  No acute osseous abnormality. Electronically Signed   By: Donavan Foil M.D.   On: 02/21/2017 18:45  LE VENOUS U/S 02/21/17:     *PRELIMINARY RESULTS* Vascular Ultrasound Left lower extremity venous duplex has been completed.  Preliminary findings: No evidence of DVT or baker's cyst.  Landry Mellow, RDMS, RVT  02/21/2017, 7:32 PM       Procedures Procedures (including critical care time)  Medications Ordered in ED Medications - No data to display   Initial Impression / Assessment and Plan / ED Course  I have reviewed the triage vital signs and the nursing notes.  Pertinent labs & imaging results that were available during my care of the patient were reviewed by me and considered in my medical decision making (see chart for details).     56 y.o. female here with L foot/ankle pain x5 months that has gradually worsened. Reports swelling as well. States she was told she had neuropathy from chemo, but didn't tolerate neurontin. Was given mobic but can't take it due to GI side effects; also given prednisone recently for ?gout, but this hasn't helped. Chronic narcotics help slightly but it continues to persist. Remote hx of DVT. On exam, marginally more swelling to L ankle/foot compared to right side, no pitting edema, tenderness along entire lateral foot/ankle and into the toes, no discoloration, NVI with soft compartments. Likely neuropathy, but given remote hx of DVT and recent cancer treatments (in remission now), will get DVT study to ensure this is not the cause. Will also xray to ensure no other etiology. Pt taking her narcotics, declines any other meds. Will reassess shortly.   9:11 PM Xrays showing mild osteopenia and osteoarthritis of 1st MTP joint. U/S reveals no baker's cyst or DVTs. Pain could be either from osteoarthritis vs neuropathy. Advised heat, tylenol/motrin, continued home pain meds, elevate legs, and f/up with PCP to discuss possibly switching to lyrica for neuropathic pain since she didn't tolerate gabapentin. Also f/up with podiatry to see if they could add anything to her care of this chronic foot  pain. Of note, smoking cessation encouraged. I explained the diagnosis and have given explicit precautions to return to the ER including for any other new or worsening symptoms. The patient understands and accepts the medical plan as it's been dictated and I have answered their questions. Discharge instructions concerning home care and prescriptions have been given. The patient is STABLE and is discharged to home in good condition.    Final Clinical Impressions(s) / ED Diagnoses   Final diagnoses:  Foot pain, left  Chronic pain of left ankle  Neuropathic pain of foot, left  Arthritis of left foot  Tobacco user    New Prescriptions New Prescriptions   No medications on [address] [place], [place] 02/21/17 2112    Carmin Muskrat, MD 02/22/17 [phone]

## 2017-02-21 NOTE — ED Triage Notes (Signed)
Pt here with left ankle pain and swelling x  1 month.  Pt has seen MD and first told plantar fasciitis.  Pt then told gout. Foot continues to be painful and swollen.

## 2017-02-21 NOTE — Discharge Instructions (Signed)
Your xrays showed a bit of arthritis in your foot, which could be causing your symptoms. It could also be neuropathy from your prior chemotherapy. Continue taking your usual home medications. Use heat to the area to see if it helps for pain control. Elevate your legs to help with pain/swelling. STOP SMOKING! Follow up with your regular doctor in 1 week for recheck and ongoing management of your symptoms; you may also want to follow up with the podiatrist in order to have ongoing evaluation and management of your foot/ankle pain. Return to the ER for emergent changes or worsening symptoms.

## 2017-02-26 ENCOUNTER — Other Ambulatory Visit: Payer: Self-pay | Admitting: Family Medicine

## 2017-03-18 ENCOUNTER — Other Ambulatory Visit: Payer: Self-pay | Admitting: Family Medicine

## 2017-03-22 ENCOUNTER — Encounter (HOSPITAL_COMMUNITY): Payer: Self-pay | Admitting: Emergency Medicine

## 2017-03-22 ENCOUNTER — Emergency Department (HOSPITAL_COMMUNITY)
Admission: EM | Admit: 2017-03-22 | Discharge: 2017-03-23 | Disposition: A | Discharge status: Home / Self Care | Payer: 59 | Attending: Emergency Medicine | Admitting: Emergency Medicine

## 2017-03-22 DIAGNOSIS — N12 Tubulo-interstitial nephritis, not specified as acute or chronic: Secondary | ICD-10-CM

## 2017-03-22 DIAGNOSIS — N10 Acute tubulo-interstitial nephritis: Secondary | ICD-10-CM | POA: Insufficient documentation

## 2017-03-22 DIAGNOSIS — I10 Essential (primary) hypertension: Secondary | ICD-10-CM | POA: Diagnosis not present

## 2017-03-22 DIAGNOSIS — F17210 Nicotine dependence, cigarettes, uncomplicated: Secondary | ICD-10-CM | POA: Insufficient documentation

## 2017-03-22 DIAGNOSIS — E119 Type 2 diabetes mellitus without complications: Secondary | ICD-10-CM | POA: Insufficient documentation

## 2017-03-22 DIAGNOSIS — Z7984 Long term (current) use of oral hypoglycemic drugs: Secondary | ICD-10-CM | POA: Diagnosis not present

## 2017-03-22 DIAGNOSIS — R109 Unspecified abdominal pain: Secondary | ICD-10-CM | POA: Diagnosis not present

## 2017-03-22 DIAGNOSIS — R1030 Lower abdominal pain, unspecified: Secondary | ICD-10-CM | POA: Diagnosis not present

## 2017-03-22 DIAGNOSIS — E876 Hypokalemia: Secondary | ICD-10-CM | POA: Diagnosis not present

## 2017-03-22 DIAGNOSIS — R110 Nausea: Secondary | ICD-10-CM

## 2017-03-22 DIAGNOSIS — R9431 Abnormal electrocardiogram [ECG] [EKG]: Secondary | ICD-10-CM | POA: Diagnosis not present

## 2017-03-22 DIAGNOSIS — R350 Frequency of micturition: Secondary | ICD-10-CM | POA: Diagnosis present

## 2017-03-22 LAB — URINALYSIS, ROUTINE W REFLEX MICROSCOPIC
Bacteria, UA: NONE SEEN
Bilirubin Urine: NEGATIVE
Glucose, UA: NEGATIVE mg/dL
Ketones, ur: NEGATIVE mg/dL
Nitrite: NEGATIVE
Protein, ur: 100 mg/dL — AB
Specific Gravity, Urine: 1.019 (ref 1.005–1.030)
pH: 5 (ref 5.0–8.0)

## 2017-03-22 NOTE — ED Triage Notes (Signed)
Pt from home with c/o 9/10 bladder pain and pressure. Pt reports increased frequency of urination and pain with urination. Pt states symptoms began on Thursday. Pt states she initially took pyrimidine but is no longer taking. Pt states she didn't think she could make it to see her doctor tomorrow

## 2017-03-23 ENCOUNTER — Emergency Department (HOSPITAL_COMMUNITY): Payer: 59

## 2017-03-23 DIAGNOSIS — R109 Unspecified abdominal pain: Secondary | ICD-10-CM | POA: Diagnosis not present

## 2017-03-23 LAB — MAGNESIUM: Magnesium: 1.4 mg/dL — ABNORMAL LOW (ref 1.7–2.4)

## 2017-03-23 LAB — CBC WITH DIFFERENTIAL/PLATELET
Basophils Absolute: 0 10*3/uL (ref 0.0–0.1)
Basophils Relative: 0 %
Eosinophils Absolute: 0.1 10*3/uL (ref 0.0–0.7)
Eosinophils Relative: 1 %
HCT: 34 % — ABNORMAL LOW (ref 36.0–46.0)
Hemoglobin: 11.5 g/dL — ABNORMAL LOW (ref 12.0–15.0)
Lymphocytes Relative: 31 %
Lymphs Abs: 2.8 10*3/uL (ref 0.7–4.0)
MCH: 29.6 pg (ref 26.0–34.0)
MCHC: 33.8 g/dL (ref 30.0–36.0)
MCV: 87.4 fL (ref 78.0–100.0)
Monocytes Absolute: 0.4 10*3/uL (ref 0.1–1.0)
Monocytes Relative: 4 %
Neutro Abs: 5.6 10*3/uL (ref 1.7–7.7)
Neutrophils Relative %: 64 %
Platelets: 263 10*3/uL (ref 150–400)
RBC: 3.89 MIL/uL (ref 3.87–5.11)
RDW: 13.9 % (ref 11.5–15.5)
WBC: 8.8 10*3/uL (ref 4.0–10.5)

## 2017-03-23 LAB — BASIC METABOLIC PANEL
Anion gap: 12 (ref 5–15)
BUN: 15 mg/dL (ref 6–20)
CO2: 27 mmol/L (ref 22–32)
Calcium: 9.4 mg/dL (ref 8.9–10.3)
Chloride: 103 mmol/L (ref 101–111)
Creatinine, Ser: 0.91 mg/dL (ref 0.44–1.00)
GFR calc Af Amer: >60 mL/min (ref >60)
GFR calc non Af Amer: >60 mL/min (ref >60)
Glucose, Bld: 99 mg/dL (ref 65–99)
Potassium: 2.8 mmol/L — ABNORMAL LOW (ref 3.5–5.1)
Sodium: 142 mmol/L (ref 135–145)

## 2017-03-23 LAB — I-STAT CG4 LACTIC ACID, ED
Lactic Acid, Venous: 2.14 mmol/L (ref 0.5–1.9)
Lactic Acid, Venous: 1.5 mmol/L (ref 0.5–1.9)

## 2017-03-23 MED ORDER — MAGNESIUM OXIDE 400 MG PO TABS: 400.0000 mg | ORAL_TABLET | Freq: Two times a day (BID) | ORAL | 0 refills | Status: DC

## 2017-03-23 MED ORDER — MORPHINE SULFATE (PF) 2 MG/ML IV SOLN
4.0000 mg | Freq: Once | INTRAVENOUS | Status: AC
Start: 2017-03-23 — End: 2017-03-23
  Administered 2017-03-23: 4 mg via INTRAVENOUS
  Filled 2017-03-23: qty 2

## 2017-03-23 MED ORDER — MAGNESIUM SULFATE 2 GM/50ML IV SOLN: 2.0000 g | Freq: Once | INTRAVENOUS | Status: DC

## 2017-03-23 MED ORDER — SODIUM CHLORIDE 0.9 % IV BOLUS (SEPSIS)
1000.0000 mL | Freq: Once | INTRAVENOUS | Status: AC
  Administered 2017-03-23: 1000 mL via INTRAVENOUS

## 2017-03-23 MED ORDER — MAGNESIUM OXIDE 400 (241.3 MG) MG PO TABS
800.0000 mg | ORAL_TABLET | Freq: Once | ORAL | Status: AC
  Administered 2017-03-23: 800 mg via ORAL
  Filled 2017-03-23: qty 2

## 2017-03-23 MED ORDER — DEXTROSE 5 % IV SOLN
1.0000 g | Freq: Once | INTRAVENOUS | Status: AC
  Administered 2017-03-23: 1 g via INTRAVENOUS
  Filled 2017-03-23: qty 10

## 2017-03-23 MED ORDER — POTASSIUM CHLORIDE CRYS ER 20 MEQ PO TBCR
20.0000 meq | EXTENDED_RELEASE_TABLET | Freq: Every day | ORAL | 0 refills | Status: DC
Start: 2017-03-23 — End: 2017-06-30

## 2017-03-23 MED ORDER — POTASSIUM CHLORIDE CRYS ER 20 MEQ PO TBCR
80.0000 meq | EXTENDED_RELEASE_TABLET | Freq: Once | ORAL | Status: AC
  Administered 2017-03-23: 80 meq via ORAL
  Filled 2017-03-23: qty 4

## 2017-03-23 MED ORDER — CEPHALEXIN 500 MG PO CAPS: 500.0000 mg | ORAL_CAPSULE | Freq: Four times a day (QID) | ORAL | 0 refills | Status: DC

## 2017-03-23 MED ORDER — SODIUM CHLORIDE 0.9 % IV BOLUS (SEPSIS)
1000.0000 mL | Freq: Once | INTRAVENOUS | Status: AC
Start: 2017-03-23 — End: 2017-03-23
  Administered 2017-03-23: 1000 mL via INTRAVENOUS

## 2017-03-23 MED ORDER — ONDANSETRON HCL 4 MG/2ML IJ SOLN
4.0000 mg | Freq: Once | INTRAMUSCULAR | Status: AC
  Administered 2017-03-23: 4 mg via INTRAVENOUS
  Filled 2017-03-23: qty 2

## 2017-03-23 NOTE — ED Provider Notes (Signed)
[facility] DEPT Provider Note   CSN: [id number] Arrival date & time: 03/22/17  2158     History   Chief Complaint Chief Complaint  Patient presents with  . Urinary Frequency    HPI [name] is a 56 y.o. female with a hx of Breast cancer, degenerative disc disease, non-insulin-dependent diabetes, GERD presents to the Emergency Department complaining of gradual, persistent, progressively worsening lower abdominal pain, dysuria with associated urinary frequency, urinary urgency and hematuria onset 6 days ago. Patient reports taking AZO at home without relief. She states she knew she had a urinary tract infection but was in the hospital with a family member and did not seek treatment with her primary care or any other medical facility. Patient states she now has significant right-sided flank pain. No history of nephrolithiasis. Patient reports nausea without vomiting. She denies fevers or chills at home. Patient reports that she completed radiation in October 2017 and last chemo 01/07/16.  She is now taking anastrozole.  Nothing seems to make the symptoms better or worse.    The history is provided by the patient and medical records. No language interpreter was used.    Past Medical History:  Diagnosis Date  . Breast cancer ([place])   . Breast cancer of lower-inner quadrant of right female breast ([place]) 08/08/2015  . Colon polyps   . DDD (degenerative disc disease), cervical   . Degenerative joint disease   . Diabetes mellitus without complication ([place])   . Diverticulosis   . Gallstones   . GERD (gastroesophageal reflux disease)   . Headache(784.0)    migraine like per patient  . Hypertension   . Low back pain    dr phillips, pain management  . Osteoarthritis     Patient Active Problem List   Diagnosis Date Noted  . Insomnia 07/16/2016  . Left groin pain 11/30/2015  . Severe obesity (BMI >= 40) ([place]) 10/26/2015  . Malignant neoplasm of lower-inner quadrant of right  breast of female, estrogen receptor positive ([place]) 08/08/2015  . Diabetes mellitus without complication ([place]) 66/02/3015  . Paraspinous mass, left 03/31/2013  . Internal hemorrhoid 02/17/2013  . Abdominal pain, other specified site 02/17/2013  . Neoplasm of soft tissue, left shoulder and left upper arm 08/03/2012  . Lightheadedness 01/29/2012  . Cholelithiasis with cholecystitis 10/08/2011  . Hypokalemia 09/17/2011  . GERD 08/08/2010  . GOUT 12/18/2008  . Essential hypertension 03/15/2007  . HEADACHE 03/15/2007    Past Surgical History:  Procedure Laterality Date  . CHOLECYSTECTOMY  10/28/2011   Procedure: LAPAROSCOPIC CHOLECYSTECTOMY WITH INTRAOPERATIVE CHOLANGIOGRAM;  Surgeon: Earnstine Regal, MD;  Location: [facility];  Service: General;  Laterality: N/A;  . COLONOSCOPY WITH ESOPHAGOGASTRODUODENOSCOPY (EGD)  02/03/2013   with Propofol  . EXAM UNDER ANESTHESIA WITH MANIPULATION OF KNEE Right 05/30/2003  . EXAM UNDER ANESTHESIA WITH MANIPULATION OF KNEE Left 12/27/2002  . GANGLION CYST EXCISION Right    right wrist  . KNEE ARTHROSCOPY Bilateral 01/02/2000  . KNEE ARTHROSCOPY Right   . LAPAROSCOPIC VAGINAL HYSTERECTOMY WITH SALPINGO OOPHORECTOMY Bilateral 07/13/2006  . LIPOMA EXCISION Right 02/21/2008   hip  . MASS EXCISION  08/24/2012   Procedure: EXCISION MASS;  Surgeon: Earnstine Regal, MD;  Location: [facility];  Service: General;  Laterality: Left;  excisie soft tissue masses left shoulder(back) & left upper arm  . MASS EXCISION Left 01/24/2014   Procedure: EXCISION SOFT TISSUE MASSES LOWER LEFT BACK;  Surgeon: Earnstine Regal, MD;  Location: [facility];  Service: General;  Laterality: Left;  Marland Kitchen MASS EXCISION Right 04/30/2016   Procedure: EXCISION OF SKIN RIGHT CHEST WALL;  Surgeon: Autumn Messing III, MD;  Location: [facility];  Service: General;  Laterality: Right;  EXCISION OF SKIN RIGHT CHEST WALL  . MASTECTOMY W/ SENTINEL NODE BIOPSY Right 10/01/2015     Procedure: RIGHT MASTECTOMY WITH SENTINEL LYMPH NODE BIOPSY;  Surgeon: Autumn Messing III, MD;  Location: [facility];  Service: General;  Laterality: Right;  . PORT-A-CATH REMOVAL Left 04/30/2016   Procedure: REMOVAL PORT-A-CATH;  Surgeon: Autumn Messing III, MD;  Location: [facility];  Service: General;  Laterality: Left;  REMOVAL PORT-A-CATH  . PORTACATH PLACEMENT Left 11/01/2015   Procedure: INSERTION PORT-A-CATH;  Surgeon: Autumn Messing III, MD;  Location: [facility];  Service: General;  Laterality: Left;  . REPLACEMENT UNICONDYLAR JOINT KNEE Left 04/20/2001  . REVISION TOTAL KNEE ARTHROPLASTY Right 06/18/2004  . REVISION TOTAL KNEE ARTHROPLASTY Left 11/15/2002  . REVISION TOTAL KNEE ARTHROPLASTY Left 10/12/2001  . REVISION TOTAL KNEE ARTHROPLASTY Right 07/09/2015  . TONSILLECTOMY    . TOTAL KNEE ARTHROPLASTY Right 04/25/2003  . TOTAL KNEE ARTHROPLASTY Left 06/25/2009    OB History    No data available       Home Medications    Prior to Admission medications   Medication Sig Start Date End Date Taking? Authorizing Provider  amitriptyline (ELAVIL) 25 MG tablet Take 25 mg by mouth at bedtime.    Yes [provider]  amLODipine (NORVASC) 5 MG tablet TAKE 1 TABLET(5 MG) BY MOUTH TWICE DAILY 04/07/16  Yes Laurey Morale, MD  anastrozole (ARIMIDEX) 1 MG tablet TAKE 1 TABLET(1 MG) BY MOUTH DAILY Patient taking differently: TAKE 1 MG BY MOUTH EVERY NIGHT 12/17/16  Yes [name], Virgie Dad, MD  lisinopril (PRINIVIL,ZESTRIL) 10 MG tablet TAKE 1 TABLET(10 MG) BY MOUTH DAILY 06/02/16  Yes Laurey Morale, MD  metFORMIN (GLUCOPHAGE) 500 MG tablet TAKE 1 TABLET(500 MG) BY MOUTH TWICE DAILY WITH A MEAL 04/28/16  Yes Laurey Morale, MD  methadone (DOLOPHINE) 10 MG tablet Take 10 mg by mouth every 8 (eight) hours as needed for moderate pain.    Yes [provider]  metoprolol (LOPRESSOR) 50 MG tablet TAKE 1 TABLET BY MOUTH TWICE DAILY Patient taking differently: TAKE 50 MG BY  MOUTH TWICE DAILY 09/30/16  Yes Laurey Morale, MD  omeprazole (PRILOSEC) 40 MG capsule TAKE 1 CAPSULE(40 MG) BY MOUTH DAILY Patient taking differently: TAKE 1 CAPSULE(40 MG) BY MOUTH EACH MORNING 05/30/16  Yes Laurey Morale, MD  ondansetron (ZOFRAN) 8 MG tablet Take 0.5-1 tablets (4-8 mg total) by mouth every 8 (eight) hours as needed for nausea or vomiting. 01/12/17  Yes Gardenia Phlegm, NP  ONE TOUCH ULTRA TEST test strip USE TO TEST ONCE DAILY 12/30/16  Yes Laurey Morale, MD  [facility] DELICA LANCETS FINE MISC USE TO TEST ONCE DAILY 08/11/16  Yes Laurey Morale, MD  oxyCODONE (ROXICODONE) 15 MG immediate release tablet Take 15 mg by mouth every 6 (six) hours as needed for pain. Reported on 12/10/2015 11/28/15  Yes [provider]  potassium chloride (K-DUR,KLOR-CON) 10 MEQ tablet TAKE 2 TABLETS(20 MEQ) BY MOUTH TWICE DAILY 06/30/16  Yes Laurey Morale, MD  zolpidem (AMBIEN) 10 MG tablet TAKE 1 TABLET BY MOUTH AT BEDTIME AS NEEDED FOR SLEEP Patient taking differently: TAKE 10 MG  BY MOUTH AT BEDTIME AS NEEDED FOR SLEEP 01/20/17  Yes  Laurey Morale, MD  cephALEXin (KEFLEX) 500 MG capsule Take 1 capsule (500 mg total) by mouth 4 (four) times daily. 03/23/17   Anicia Leuthold, Jarrett Soho, PA-C  furosemide (LASIX) 20 MG tablet Take 1 tablet (20 mg total) by mouth daily. Patient not taking: Reported on 03/23/2017 04/07/16   Laurey Morale, MD  potassium chloride SA (K-DUR,KLOR-CON) 20 MEQ tablet Take 1 tablet (20 mEq total) by mouth daily. 03/23/17   Deiondra Denley, Jarrett Soho, PA-C  predniSONE (DELTASONE) 10 MG tablet Take 5 tabs a day for 3 days, then 4 tabs a day for 3 days, then 3 tabs a day for 3 days, then 2 tabs a day for 3 days, then 1 tab a day for 3 days, then stop Patient not taking: Reported on 03/23/2017 02/06/17   Laurey Morale, MD    Family History Family History  Problem Relation Age of Onset  . Stomach cancer Mother   . Heart disease Father   . Prostate cancer Father   . Kidney cancer Sister         one out of the four  . Hypertension Sister   . Hypertension Brother   . Crohn's disease Other        nephew  . Colon cancer Maternal Grandmother     Social History Social History  Substance Use Topics  . Smoking status: Current Every Day Smoker    Packs/day: 0.25    Years: 20.00    Types: Cigarettes  . Smokeless tobacco: Former Systems developer    Quit date: 09/30/2015     Comment: smokes about 4 cigs/day; working on quitting - 08/22/16 gwd  . Alcohol use No     Allergies   Codeine; Latex; and Sulfonamide derivatives   Review of Systems Review of Systems  Constitutional: Negative for appetite change, diaphoresis, fatigue, fever and unexpected weight change.  HENT: Negative for mouth sores.   Eyes: Negative for visual disturbance.  Respiratory: Negative for cough, chest tightness, shortness of breath and wheezing.   Cardiovascular: Negative for chest pain.  Gastrointestinal: Positive for abdominal pain and nausea. Negative for constipation, diarrhea and vomiting.  Endocrine: Negative for polydipsia, polyphagia and polyuria.  Genitourinary: Positive for dysuria, flank pain, frequency, hematuria and urgency.  Musculoskeletal: Positive for back pain. Negative for neck stiffness.  Skin: Negative for rash.  Allergic/Immunologic: Negative for immunocompromised state.  Neurological: Negative for syncope, light-headedness and headaches.  Hematological: Does not bruise/bleed easily.  Psychiatric/Behavioral: Negative for sleep disturbance. The patient is not nervous/anxious.   All other systems reviewed and are negative.    Physical Exam Updated Vital Signs BP (!) 159/87 (BP Location: Left Arm)   Pulse 66   Temp 98.5 F (36.9 C) (Oral)   Resp 18   Ht 5\' 7"  (1.702 m)   Wt 114.3 kg (252 lb)   SpO2 99%   BMI 39.47 kg/m   Physical Exam  Constitutional: She appears well-developed and well-nourished. No distress.  Awake, alert, nontoxic appearance  HENT:  Head: Normocephalic and  atraumatic.  Mouth/Throat: Oropharynx is clear and moist. No oropharyngeal exudate.  Eyes: Conjunctivae are normal. No scleral icterus.  Neck: Normal range of motion. Neck supple.  Cardiovascular: Normal rate, regular rhythm, normal heart sounds and intact distal pulses.   Pulmonary/Chest: Effort normal and breath sounds normal. No respiratory distress. She has no wheezes.  Equal chest expansion  Abdominal: Soft. Bowel sounds are normal. She exhibits no distension and no mass. There is tenderness in the suprapubic area. There is  CVA tenderness (right). There is no rebound and no guarding.  Musculoskeletal: Normal range of motion. She exhibits no edema.  Neurological: She is alert.  Speech is clear and goal oriented Moves extremities without ataxia  Skin: Skin is warm and dry. No rash noted. She is not diaphoretic.  Psychiatric: She has a normal mood and affect.  Nursing note and vitals reviewed.    ED Treatments / Results  Labs (all labs ordered are listed, but only abnormal results are displayed) Labs Reviewed  URINALYSIS, ROUTINE W REFLEX MICROSCOPIC - Abnormal; Notable for the following:       Result Value   APPearance CLOUDY (*)    Hgb urine dipstick LARGE (*)    Protein, ur 100 (*)    Leukocytes, UA LARGE (*)    Squamous Epithelial / LPF 0-5 (*)    Non Squamous Epithelial 0-5 (*)    All other components within normal limits  CBC WITH DIFFERENTIAL/PLATELET - Abnormal; Notable for the following:    Hemoglobin 11.5 (*)    HCT 34.0 (*)    All other components within normal limits  BASIC METABOLIC PANEL - Abnormal; Notable for the following:    Potassium 2.8 (*)    All other components within normal limits  I-STAT CG4 LACTIC ACID, ED - Abnormal; Notable for the following:    Lactic Acid, Venous 2.14 (*)    All other components within normal limits  URINE CULTURE  MAGNESIUM  I-STAT CG4 LACTIC ACID, ED    EKG  EKG Interpretation  Date/Time:  Monday March 23 2017 04:43:36  EDT Ventricular Rate:  74 PR Interval:    QRS Duration: 100 QT Interval:  456 QTC Calculation: 506 R Axis:   55 Text Interpretation:  Sinus rhythm Borderline repolarization abnormality Borderline prolonged QT interval Confirmed by Pryor Curia [phone]) on 03/23/2017 5:03:05 AM       EKG Interpretation  Date/Time:  Monday March 23 2017 05:46:27 EDT Ventricular Rate:  76 PR Interval:    QRS Duration: 96 QT Interval:  439 QTC Calculation: 494 R Axis:   43 Text Interpretation:  Sinus rhythm Borderline repolarization abnormality Borderline prolonged QT interval Confirmed by Pryor Curia [phone]) on 03/23/2017 5:53:48 AM       Radiology Ct Renal Stone Study  Result Date: 03/23/2017 CLINICAL DATA:  Acute onset of bladder pain and pressure, with increased urinary frequency. Dysuria and right flank pain. Initial encounter. EXAM: CT ABDOMEN AND PELVIS WITHOUT CONTRAST TECHNIQUE: Multidetector CT imaging of the abdomen and pelvis was performed following the standard protocol without IV contrast. COMPARISON:  Abdominal ultrasound performed 10/02/2011 FINDINGS: Lower chest: Scarring is noted at the right lung base. The visualized portions of the mediastinum are unremarkable. Hepatobiliary: The liver is unremarkable in appearance. The patient is status post cholecystectomy, with clips noted at the gallbladder fossa. The common bile duct remains normal in caliber. Pancreas: The pancreas is within normal limits. Spleen: The spleen is unremarkable in appearance. Adrenals/Urinary Tract: The adrenal glands are unremarkable in appearance. Scattered left renal cysts are noted. There is no evidence of hydronephrosis. No renal or ureteral stones are identified. No perinephric stranding is seen. Stomach/Bowel: The stomach is unremarkable in appearance. The small bowel is within normal limits. The appendix is normal in caliber, without evidence of appendicitis. Mild diverticulosis is noted along the descending colon,  without evidence of diverticulitis. Vascular/Lymphatic: Scattered calcification is seen along the abdominal aorta and its branches. The abdominal aorta is otherwise grossly unremarkable. The inferior vena  cava is grossly unremarkable. No retroperitoneal lymphadenopathy is seen. No pelvic sidewall lymphadenopathy is identified. Reproductive: The bladder is mildly distended and grossly unremarkable. The patient is status post hysterectomy. No suspicious adnexal masses are seen. Other: No additional soft tissue abnormalities are seen. Musculoskeletal: No acute osseous abnormalities are identified. Vacuum phenomenon is noted at L5-S1. The visualized musculature is unremarkable in appearance. IMPRESSION: 1. No acute abnormality seen to explain the patient's symptoms. 2. Scattered aortic atherosclerosis. 3. Left renal cysts noted. 4. Mild diverticulosis along the descending colon, without evidence of diverticulitis. Electronically Signed   By: Garald Balding M.D.   On: 03/23/2017 05:28    Procedures Procedures (including critical care time)  Medications Ordered in ED Medications  sodium chloride 0.9 % bolus 1,000 mL (0 mLs Intravenous Stopped 03/23/17 0356)  cefTRIAXone (ROCEPHIN) 1 g in dextrose 5 % 50 mL IVPB (0 g Intravenous Stopped 03/23/17 0331)  morphine 2 MG/ML injection 4 mg (4 mg Intravenous Given 03/23/17 0234)  ondansetron (ZOFRAN) injection 4 mg (4 mg Intravenous Given 03/23/17 0234)  sodium chloride 0.9 % bolus 1,000 mL (1,000 mLs Intravenous New Bag/Given 03/23/17 0300)  potassium chloride SA (K-DUR,KLOR-CON) CR tablet 80 mEq (80 mEq Oral Given 03/23/17 0457)  morphine 2 MG/ML injection 4 mg (4 mg Intravenous Given 03/23/17 0457)     Initial Impression / Assessment and Plan / ED Course  I have reviewed the triage vital signs and the nursing notes.  Pertinent labs & imaging results that were available during my care of the patient were reviewed by me and considered in my medical decision making (see  chart for details).  Clinical Course as of Mar 24 703  Mon Mar 23, 2017  0430 Pt continues to have severe right flank pain.  Will obtain CT to r/o obstruction, mets or stones  [HM]    Clinical Course User Index [HM] Felipe Paluch, Jarrett Soho, PA-C    Patient presents with urinary symptoms 1 week. Right-sided CVA tenderness. Concern for pyelonephritis. Patient given fluids and antibiotics. Labs reassuring. Patient with difficult to control pain. CT scan without signs of obstruction or nephrolithiasis.  Pt given rocephin here in the ED and will be d/c home with keflex.  Urine culture pending.  Pt remains afebrile.  Lactic acid has improved after fluids.  No evidence of sepsis.  Pt with hypokalemia, replacement started in the department and will be d/c home with potassium.  ECG with borderline prolonged QT.  Pt with hx of similar.  She is well appearing and has tolerated PO without difficulty.    At shift change, magnesium level pending.  Dr. Leonides Schanz will follow mag and replace if low.    Discussed the importance of follow-up with PCP for repeat labs and immediate return to the ED if symptoms worsen, fever or vomiting develop.  Pt states understanding and is in agreement with the plan.    Final Clinical Impressions(s) / ED Diagnoses   Final diagnoses:  Pyelonephritis  Hypokalemia  Nausea    New Prescriptions New Prescriptions   CEPHALEXIN (KEFLEX) 500 MG CAPSULE    Take 1 capsule (500 mg total) by mouth 4 (four) times daily.   POTASSIUM CHLORIDE SA (K-DUR,KLOR-CON) 20 MEQ TABLET    Take 1 tablet (20 mEq total) by mouth daily.     Deloris Moger, Gwenlyn Perking 03/23/17 0710    Ward, Delice Bison, DO 03/23/17 [phone]

## 2017-03-23 NOTE — ED Notes (Signed)
Writer made two unsuccessful attempts to draw labs. 

## 2017-03-23 NOTE — ED Notes (Signed)
I attempted to collect labs and was unsuccessful. 

## 2017-03-23 NOTE — Discharge Instructions (Signed)
1. Medications: keflex, potassium, usual home medications 2. Treatment: rest, drink plenty of fluids,  3. Follow Up: Please followup with your primary doctor in 2 days for discussion of your diagnoses and further evaluation after today's visit; if you do not have a primary care doctor use the resource guide provided to find one; Please return to the ER for worsening symptoms, fever, chills, worsening pain or other concerns

## 2017-03-24 LAB — URINE CULTURE

## 2017-03-27 ENCOUNTER — Ambulatory Visit (INDEPENDENT_AMBULATORY_CARE_PROVIDER_SITE_OTHER): Payer: 59 | Admitting: Family Medicine

## 2017-03-27 VITALS — Temp 98.9°F | Ht 67.0 in | Wt 249.0 lb

## 2017-03-27 DIAGNOSIS — N12 Tubulo-interstitial nephritis, not specified as acute or chronic: Secondary | ICD-10-CM | POA: Diagnosis not present

## 2017-03-27 MED ORDER — ZOLPIDEM TARTRATE 10 MG PO TABS: 10.0000 mg | ORAL_TABLET | Freq: Every evening | ORAL | 5 refills | Status: DC | PRN

## 2017-03-27 NOTE — Patient Instructions (Signed)
WE NOW OFFER   [name] FAST TRACK!!!  SAME DAY Appointments for ACUTE CARE  Such as: Sprains, Injuries, cuts, abrasions, rashes, muscle pain, joint pain, back pain Colds, flu, sore throats, headache, allergies, cough, fever  Ear pain, sinus and eye infections Abdominal pain, nausea, vomiting, diarrhea, upset stomach Animal/insect bites  3 Easy Ways to Schedule: Walk-In Scheduling Call in scheduling Mychart Sign-up: [email or web address]         

## 2017-03-30 ENCOUNTER — Encounter: Payer: Self-pay | Admitting: Family Medicine

## 2017-03-30 NOTE — Progress Notes (Signed)
   Subjective:    Patient ID: [name], female    DOB: Nov 08, 1960, 56 y.o.   MRN: [id number]  HPI Here to follow up an ER visit on 03-22-17 for several days of right flank pain and lower abdominal pain. She was diagnosed with pyelonephritis and was treated with a course of Keflex. A CT scan that day revealed no renal stones and no obstruction or hydronephrosis. A urine culture could not isolate a bacteria. Today she feels back to normal. We reviewed all notes and test results together today.   Review of Systems  Constitutional: Negative.   Respiratory: Negative.   Cardiovascular: Negative.   Gastrointestinal: Negative.   Genitourinary: Negative.        Objective:   Physical Exam  Constitutional: She appears well-developed and well-nourished.  Cardiovascular: Normal rate, regular rhythm, normal heart sounds and intact distal pulses.   Pulmonary/Chest: Effort normal and breath sounds normal. No respiratory distress. She has no wheezes. She has no rales.  Abdominal: Soft. Bowel sounds are normal. She exhibits no distension and no mass. There is no tenderness. There is no rebound and no guarding.          Assessment & Plan:  UTI, resolved. She will continue to drink plenty of water. Recheck prn.  Alysia Penna, MD

## 2017-04-06 DIAGNOSIS — M150 Primary generalized (osteo)arthritis: Secondary | ICD-10-CM | POA: Diagnosis not present

## 2017-04-06 DIAGNOSIS — G894 Chronic pain syndrome: Secondary | ICD-10-CM | POA: Diagnosis not present

## 2017-04-06 DIAGNOSIS — Z79891 Long term (current) use of opiate analgesic: Secondary | ICD-10-CM | POA: Diagnosis not present

## 2017-04-08 ENCOUNTER — Other Ambulatory Visit: Payer: Self-pay | Admitting: Family Medicine

## 2017-04-13 ENCOUNTER — Other Ambulatory Visit: Payer: Self-pay | Admitting: Family Medicine

## 2017-04-13 NOTE — Telephone Encounter (Signed)
Looks like pt hasn't had this medication filled in awhile?

## 2017-05-04 ENCOUNTER — Telehealth: Payer: Self-pay

## 2017-05-04 NOTE — Telephone Encounter (Signed)
Pt is having limited mobility in her R arm, can't raise it over her head. Started about 1 1/2 month ago. It is getting worse. R breast feels heavier. No pain in the breast.  Pain in armpit radiating down inside of arm, is constant. She does not have any swelling in her arm. Did encourage her to wear her compression sleeve daily.  Did encourage pt to contact her surgeon about her breast swelling in the area of her flap repair.  She is also asking if a referral to lymphedema clinic would help

## 2017-05-04 NOTE — Telephone Encounter (Signed)
Pt called that she is having problems with her R arm and would like to see lymphedema clinic again.  Unable to contact pt to clarify what problems.

## 2017-05-08 ENCOUNTER — Other Ambulatory Visit: Payer: Self-pay | Admitting: *Deleted

## 2017-05-08 DIAGNOSIS — Z170 Estrogen receptor positive status [ER+]: Principal | ICD-10-CM

## 2017-05-08 DIAGNOSIS — C50311 Malignant neoplasm of lower-inner quadrant of right female breast: Secondary | ICD-10-CM

## 2017-05-08 NOTE — Telephone Encounter (Signed)
Referral for lymphedema PT sent to outpt rehab.

## 2017-05-09 ENCOUNTER — Other Ambulatory Visit: Payer: Self-pay | Admitting: Oncology

## 2017-05-11 ENCOUNTER — Ambulatory Visit: Payer: 59 | Attending: Oncology | Admitting: Physical Therapy

## 2017-05-11 ENCOUNTER — Encounter: Payer: Self-pay | Admitting: Physical Therapy

## 2017-05-11 DIAGNOSIS — M6281 Muscle weakness (generalized): Secondary | ICD-10-CM

## 2017-05-11 DIAGNOSIS — M25612 Stiffness of left shoulder, not elsewhere classified: Secondary | ICD-10-CM | POA: Insufficient documentation

## 2017-05-11 DIAGNOSIS — I972 Postmastectomy lymphedema syndrome: Secondary | ICD-10-CM | POA: Diagnosis not present

## 2017-05-11 DIAGNOSIS — M25511 Pain in right shoulder: Secondary | ICD-10-CM | POA: Diagnosis not present

## 2017-05-11 DIAGNOSIS — M25611 Stiffness of right shoulder, not elsewhere classified: Secondary | ICD-10-CM | POA: Diagnosis not present

## 2017-05-11 NOTE — Therapy (Signed)
[place], [place], [address] Phone: [phone]   Fax:  [phone]  Physical Therapy Evaluation  Patient Details  Name: [name] MRN: [id number] Date of Birth: 1961/01/16 Referring Provider: Dr. Jana Hakim  Encounter Date: 05/11/2017      PT End of Session - 05/11/17 1706    Visit Number 1   Number of Visits 13   Date for PT Re-Evaluation 06/08/17   PT Start Time 1520   PT Stop Time 1602   PT Time Calculation (min) 42 min   Activity Tolerance Patient tolerated treatment well   Behavior During Therapy [facility] for tasks assessed/performed      Past Medical History:  Diagnosis Date  . Breast cancer ([place])   . Breast cancer of lower-inner quadrant of right female breast ([place]) 08/08/2015  . Colon polyps   . DDD (degenerative disc disease), cervical   . Degenerative joint disease   . Diabetes mellitus without complication ([place])   . Diverticulosis   . Gallstones   . GERD (gastroesophageal reflux disease)   . Headache(784.0)    migraine like per patient  . Hypertension   . Low back pain    dr phillips, pain management  . Osteoarthritis     Past Surgical History:  Procedure Laterality Date  . CHOLECYSTECTOMY  10/28/2011   Procedure: LAPAROSCOPIC CHOLECYSTECTOMY WITH INTRAOPERATIVE CHOLANGIOGRAM;  Surgeon: Earnstine Regal, MD;  Location: [facility];  Service: General;  Laterality: N/A;  . COLONOSCOPY WITH ESOPHAGOGASTRODUODENOSCOPY (EGD)  02/03/2013   with Propofol  . EXAM UNDER ANESTHESIA WITH MANIPULATION OF KNEE Right 05/30/2003  . EXAM UNDER ANESTHESIA WITH MANIPULATION OF KNEE Left 12/27/2002  . GANGLION CYST EXCISION Right    right wrist  . KNEE ARTHROSCOPY Bilateral 01/02/2000  . KNEE ARTHROSCOPY Right   . LAPAROSCOPIC VAGINAL HYSTERECTOMY WITH SALPINGO OOPHORECTOMY Bilateral 07/13/2006  . LIPOMA EXCISION Right 02/21/2008   hip  . MASS EXCISION  08/24/2012   Procedure: EXCISION MASS;   Surgeon: Earnstine Regal, MD;  Location: [place];  Service: General;  Laterality: Left;  excisie soft tissue masses left shoulder(back) & left upper arm  . MASS EXCISION Left 01/24/2014   Procedure: EXCISION SOFT TISSUE MASSES LOWER LEFT BACK;  Surgeon: Earnstine Regal, MD;  Location: [place];  Service: General;  Laterality: Left;  Marland Kitchen MASS EXCISION Right 04/30/2016   Procedure: EXCISION OF SKIN RIGHT CHEST WALL;  Surgeon: Autumn Messing III, MD;  Location: [place];  Service: General;  Laterality: Right;  EXCISION OF SKIN RIGHT CHEST WALL  . MASTECTOMY W/ SENTINEL NODE BIOPSY Right 10/01/2015   Procedure: RIGHT MASTECTOMY WITH SENTINEL LYMPH NODE BIOPSY;  Surgeon: Autumn Messing III, MD;  Location: [place];  Service: General;  Laterality: Right;  . PORT-A-CATH REMOVAL Left 04/30/2016   Procedure: REMOVAL PORT-A-CATH;  Surgeon: Autumn Messing III, MD;  Location: [place];  Service: General;  Laterality: Left;  REMOVAL PORT-A-CATH  . PORTACATH PLACEMENT Left 11/01/2015   Procedure: INSERTION PORT-A-CATH;  Surgeon: Autumn Messing III, MD;  Location: [place];  Service: General;  Laterality: Left;  . REPLACEMENT UNICONDYLAR JOINT KNEE Left 04/20/2001  . REVISION TOTAL KNEE ARTHROPLASTY Right 06/18/2004  . REVISION TOTAL KNEE ARTHROPLASTY Left 11/15/2002  . REVISION TOTAL KNEE ARTHROPLASTY Left 10/12/2001  . REVISION TOTAL KNEE ARTHROPLASTY Right 07/09/2015  . TONSILLECTOMY    . TOTAL KNEE ARTHROPLASTY Right 04/25/2003  . TOTAL KNEE ARTHROPLASTY Left  06/25/2009    There were no vitals filed for this visit.       Subjective Assessment - 05/11/17 1523    Subjective I was doing good after last time but now my right arm is getting stiff. It starts to hurt when I raise it. My breast and my arm and hand are swelling. I was not wearing my sleeve because I sweat so bad with the anastrozole. I misplaced my glove. My arm is swelling worse now than it was  originally.    Pertinent History breast cancer with right  mastectomy with 20 lymph nodes removed in january,  chemo with peripheral neuropathy  and  had to have more skin removed before radiation radiation with skin disruption which was concluded last of October.  Past history includes multiple levels of degenerative disc disease in neck and spine and 4 knee surgeries on each knee with the last one in October 2016    Patient Stated Goals to get some range of motion in my right arm, tell me what I am doing wrong to cause this swelling in my chest    Currently in Pain? Yes   Pain Score 4    Pain Location --  trunk   Pain Orientation Right   Pain Descriptors / Indicators Sharp   Pain Type Chronic pain   Pain Radiating Towards towards axilla   Pain Onset More than a month ago   Pain Frequency Intermittent   Aggravating Factors  moving a lot   Pain Relieving Factors pain meds   Effect of Pain on Daily Activities need help getting clothing and bra on, pushes prothesis up out of bra            [facility] PT Assessment - 05/11/17 0001      Assessment   Medical Diagnosis breast cancer   Referring Provider Dr. Jana Hakim   Onset Date/Surgical Date 08/02/15   Hand Dominance Right   Prior Therapy none     Precautions   Precautions Other (comment)   Precaution Comments lymphedema, previous chemo and radiation      Restrictions   Weight Bearing Restrictions No     Balance Screen   Has the patient fallen in the past 6 months No   Has the patient had a decrease in activity level because of a fear of falling?  No   Is the patient reluctant to leave their home because of a fear of falling?  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"      Observation/Other Assessments   Observations --   Skin Integrity --   Other Surveys  --  LLIS: 37% impairment   Quick DASH  --     Sensation   Light Touch --     Coordination   Gross Motor  Movements are Fluid and Coordinated --  pt does not want to address neuropathy symptoms at this time     Posture/Postural Control   Posture/Postural Control Postural limitations   Postural Limitations Rounded Shoulders;Forward head     AROM   Right Shoulder Extension --   Right Shoulder Flexion 85 Degrees   Right Shoulder ABduction 92 Degrees   Right Shoulder Internal Rotation 42 Degrees   Right Shoulder External Rotation 59 Degrees   Left Shoulder Flexion 163 Degrees   Left Shoulder ABduction 149 Degrees   Left Shoulder Internal Rotation 51 Degrees   Left Shoulder External Rotation 90 Degrees     Strength   Right Shoulder Flexion 2+/5   Right Shoulder ABduction 3-/5   Left Shoulder Flexion 3-/5   Left Shoulder ABduction 3-/5     Palpation   Palpation comment --           LYMPHEDEMA/ONCOLOGY QUESTIONNAIRE - 05/11/17 1553      Type   Cancer Type Right breast cancer     Surgeries   Mastectomy Date 08/02/15   Axillary Lymph Node Dissection Date 08/02/15   Number Lymph Nodes Removed 21     Date Lymphedema/Swelling Started   Date 03/11/17     Treatment   Active Chemotherapy Treatment No   Past Chemotherapy Treatment Yes   Active Radiation Treatment No   Past Radiation Treatment Yes   Current Hormone Treatment Yes   Drug Name Arimidex     What other symptoms do you have   Are you Having Heaviness or  Tightness Yes   Are you having Pain Yes   Are you having pitting edema No   Is it Hard or Difficult finding clothes that fit Yes   Do you have infections No   Is there Decreased scar mobility Yes     Lymphedema Assessments   Lymphedema Assessments Upper extremities     Right Upper Extremity Lymphedema   15 cm Proximal to Olecranon Process 38.5 cm   10 cm Proximal to Olecranon Process 37.5 cm   Olecranon Process 34 cm   15 cm Proximal to Ulnar Styloid Process 30.5 cm   10 cm Proximal to Ulnar Styloid Process 26.2 cm   Just Proximal to Ulnar Styloid Process 21 cm   Across Hand at [employer] 22.5 cm   At [place] of 2nd Digit 8.1 cm     Left Upper Extremity Lymphedema   15 cm Proximal to Olecranon Process 38.5 cm   10 cm Proximal to Olecranon Process 38 cm   Olecranon Process 33 cm   15 cm Proximal to Ulnar Styloid Process 31 cm   10 cm Proximal to Ulnar Styloid Process 26.3 cm   Just Proximal to Ulnar Styloid Process 20.1 cm   Across Hand at [employer] 24.2 cm   At [place] of 2nd Digit 7.8 cm           Quick Dash - 05/11/17 0001    Open a tight or new jar Moderate difficulty   Do heavy household chores (wash walls, wash floors) Moderate difficulty   Carry a shopping bag or briefcase Moderate difficulty   Wash your back Severe difficulty   Use a knife to cut food Severe difficulty   Recreational activities in which you take some force or impact through your arm, shoulder, or hand (golf, hammering, tennis) Moderate difficulty   During the past week, to what extent has your arm, shoulder or hand problem interfered  with your normal social activities with family, friends, neighbors, or groups? Quite a bit   During the past week, to what extent has your arm, shoulder or hand problem limited your work or other regular daily activities [place]   Arm, shoulder, or hand pain. Severe   Tingling (pins and needles) in your arm, shoulder, or hand Severe   Difficulty Sleeping  Moderate difficulty   DASH Score 61.36 %      Objective measurements completed on examination: See above findings.                  PT Education - 05/11/17 1706    Education provided Yes   Education Details anatomy and physiology of lymphatic system   Person(s) Educated Patient   Methods Explanation   Comprehension Verbalized understanding                [facility] Goals - 05/11/17 1713      CC Long Term Goal  #1   Title Patient with verbalize an understanding of lymphedema risk reduction precautions   Time 4   Period Weeks   Status New   Target Date 06/08/17     CC Long Term Goal  #2   Title Patient will be independent in a home exercise program for continued strengthening and stretching   Time 4   Period Weeks   Status New   Target Date 06/08/17     CC Long Term Goal  #3   Title Patient will improve left shoulder abduction to 130 degrees with no pain  so that she can perform dressing tasks indpendently    Baseline 92   Time 4   Period Weeks   Status New   Target Date 06/08/17     CC Long Term Goal  #4   Title Pt will demonstrate 130 degrees of right shoulder flexion to allow her to reach items overhead   Baseline 85   Time 4   Period Weeks   Status New   Target Date 06/08/17     CC Long Term Goal  #5   Title Pt will obtain appropriate compression bra for long term managment of lymphedema, and an appropriate compression sleeve for prophylactic use   Time 4   Period Weeks   Status New             Plan - 05/11/17 1607    Clinical Impression Statement Pt reports to PT with decreased R shoulder ROM, lymphedema of right trunk and pain in R shoulder and in area of swelling. Pt states she is having difficulty sleeping due to the pain and is unable to don her shirt and bra independently. She is having trouble keeping her prothesis in place because she states the swelling pushes it upwards. Her bra causes her pain because it cuts into  the area that is swelling. She demonstrates decreased bilateral shoulder ROM but has significant decrease on the right. She states she has bad arthritis. She would benefit from skilled PT services to increase bilateral shoulder ROM but mainly R, decrease right trunk swelling, increase scar mobility, decrease pain and assist pt with obtaining appropriate compression bra for long term managment of edema.   History and Personal Factors relevant to plan of care: Right handed, arthritis   Clinical Presentation Evolving   Clinical Presentation due to: recent onset of swelling and decreased shoulder ROM   Clinical Decision Making Moderate   Rehab Potential Good   Clinical Impairments Affecting  Rehab Potential 20 lymph nodes removed, previous radiation    PT Frequency 3x / week   PT Duration 4 weeks   PT Treatment/Interventions ADLs/Self Care Home Management;Patient/family education;Manual techniques;Scar mobilization;Passive range of motion;Therapeutic activities;Therapeutic exercise;Manual lymph drainage;Taping;Vasopneumatic Device   PT Next Visit Plan made chip pack, assist pt with compression bra, begin MLD to R trunk, give supine dowel exercises   Consulted and Agree with Plan of Care Patient      Patient will benefit from skilled therapeutic intervention in order to improve the following deficits and impairments:  Decreased knowledge of precautions, Decreased scar mobility, Decreased strength, Pain, Impaired UE functional use, Increased fascial restricitons, Postural dysfunction, Decreased range of motion, Increased edema  Visit Diagnosis: Postmastectomy lymphedema - Plan: PT plan of care cert/re-cert  Stiffness of right shoulder, not elsewhere classified - Plan: PT plan of care cert/re-cert  Acute pain of right shoulder - Plan: PT plan of care cert/re-cert  Stiffness of left shoulder, not elsewhere classified - Plan: PT plan of care cert/re-cert  Muscle weakness (generalized) - Plan: PT plan  of care cert/re-cert      G-Codes - 86/38/17 1718    Functional Assessment Tool Used (Outpatient Only) quick dash   Functional Limitation Carrying, moving and handling objects   Carrying, Moving and Handling Objects Current Status (R1165) At least 60 percent but less than 80 percent impaired, limited or restricted   Carrying, Moving and Handling Objects Goal Status (B9038) At least 20 percent but less than 40 percent impaired, limited or restricted       Problem List Patient Active Problem List   Diagnosis Date Noted  . Insomnia 07/16/2016  . Left groin pain 11/30/2015  . Severe obesity (BMI >= 40) ([place]) 10/26/2015  . Malignant neoplasm of lower-inner quadrant of right breast of female, estrogen receptor positive ([place]) 08/08/2015  . Diabetes mellitus without complication ([place]) 33/38/3291  . Paraspinous mass, left 03/31/2013  . Internal hemorrhoid 02/17/2013  . Abdominal pain, other specified site 02/17/2013  . Neoplasm of soft tissue, left shoulder and left upper arm 08/03/2012  . Lightheadedness 01/29/2012  . Cholelithiasis with cholecystitis 10/08/2011  . Hypokalemia 09/17/2011  . GERD 08/08/2010  . GOUT 12/18/2008  . Essential hypertension 03/15/2007  . HEADACHE 03/15/2007    Allyson Sabal [facility] 05/11/2017, 5:21 PM  [place], [place], [address] Phone: [phone]   Fax:  [phone]  Name: [name] MRN: [id number] Date of Birth: Sep 28, 1960  Manus Gunning, PT 05/11/17 5:21 PM

## 2017-05-12 ENCOUNTER — Ambulatory Visit: Payer: 59

## 2017-05-12 DIAGNOSIS — M25611 Stiffness of right shoulder, not elsewhere classified: Secondary | ICD-10-CM

## 2017-05-12 DIAGNOSIS — I972 Postmastectomy lymphedema syndrome: Secondary | ICD-10-CM | POA: Diagnosis not present

## 2017-05-12 DIAGNOSIS — M25511 Pain in right shoulder: Secondary | ICD-10-CM

## 2017-05-12 DIAGNOSIS — M25612 Stiffness of left shoulder, not elsewhere classified: Secondary | ICD-10-CM

## 2017-05-12 NOTE — Therapy (Signed)
[place], [place], [address] Phone: [phone]   Fax:  [phone]  Physical Therapy Treatment  Patient Details  Name: [name] MRN: [id number] Date of Birth: 1960/09/25 Referring Provider: Dr. Jana Hakim  Encounter Date: 05/12/2017      PT End of Session - 05/12/17 1212    Visit Number 2   Number of Visits 13   Date for PT Re-Evaluation 06/08/17   PT Start Time 1108   PT Stop Time 1158   PT Time Calculation (min) 50 min   Activity Tolerance Patient tolerated treatment well   Behavior During Therapy [facility] for tasks assessed/performed      Past Medical History:  Diagnosis Date  . Breast cancer ([place])   . Breast cancer of lower-inner quadrant of right female breast ([place]) 08/08/2015  . Colon polyps   . DDD (degenerative disc disease), cervical   . Degenerative joint disease   . Diabetes mellitus without complication ([place])   . Diverticulosis   . Gallstones   . GERD (gastroesophageal reflux disease)   . Headache(784.0)    migraine like per patient  . Hypertension   . Low back pain    dr phillips, pain management  . Osteoarthritis     Past Surgical History:  Procedure Laterality Date  . CHOLECYSTECTOMY  10/28/2011   Procedure: LAPAROSCOPIC CHOLECYSTECTOMY WITH INTRAOPERATIVE CHOLANGIOGRAM;  Surgeon: Earnstine Regal, MD;  [facility];  Service: General;  Laterality: N/A;  . COLONOSCOPY WITH ESOPHAGOGASTRODUODENOSCOPY (EGD)  02/03/2013   with Propofol  . EXAM UNDER ANESTHESIA WITH MANIPULATION OF KNEE Right 05/30/2003  . EXAM UNDER ANESTHESIA WITH MANIPULATION OF KNEE Left 12/27/2002  . GANGLION CYST EXCISION Right    right wrist  . KNEE ARTHROSCOPY Bilateral 01/02/2000  . KNEE ARTHROSCOPY Right   . LAPAROSCOPIC VAGINAL HYSTERECTOMY WITH SALPINGO OOPHORECTOMY Bilateral 07/13/2006  . LIPOMA EXCISION Right 02/21/2008   hip  . MASS EXCISION  08/24/2012   Procedure: EXCISION MASS;   Surgeon: Earnstine Regal, MD;  Location: [place];  Service: General;  Laterality: Left;  excisie soft tissue masses left shoulder(back) & left upper arm  . MASS EXCISION Left 01/24/2014   Procedure: EXCISION SOFT TISSUE MASSES LOWER LEFT BACK;  Surgeon: Earnstine Regal, MD;  Location: [place];  Service: General;  Laterality: Left;  Marland Kitchen MASS EXCISION Right 04/30/2016   Procedure: EXCISION OF SKIN RIGHT CHEST WALL;  Surgeon: Autumn Messing III, MD;  Location: [place];  Service: General;  Laterality: Right;  EXCISION OF SKIN RIGHT CHEST WALL  . MASTECTOMY W/ SENTINEL NODE BIOPSY Right 10/01/2015   Procedure: RIGHT MASTECTOMY WITH SENTINEL LYMPH NODE BIOPSY;  Surgeon: Autumn Messing III, MD;  Location: [place];  Service: General;  Laterality: Right;  . PORT-A-CATH REMOVAL Left 04/30/2016   Procedure: REMOVAL PORT-A-CATH;  Surgeon: Autumn Messing III, MD;  Location: [place];  Service: General;  Laterality: Left;  REMOVAL PORT-A-CATH  . PORTACATH PLACEMENT Left 11/01/2015   Procedure: INSERTION PORT-A-CATH;  Surgeon: Autumn Messing III, MD;  Location: [place];  Service: General;  Laterality: Left;  . REPLACEMENT UNICONDYLAR JOINT KNEE Left 04/20/2001  . REVISION TOTAL KNEE ARTHROPLASTY Right 06/18/2004  . REVISION TOTAL KNEE ARTHROPLASTY Left 11/15/2002  . REVISION TOTAL KNEE ARTHROPLASTY Left 10/12/2001  . REVISION TOTAL KNEE ARTHROPLASTY Right 07/09/2015  . TONSILLECTOMY    . TOTAL KNEE ARTHROPLASTY Right 04/25/2003  . TOTAL KNEE ARTHROPLASTY Left  06/25/2009    There were no vitals filed for this visit.      Subjective Assessment - 05/12/17 1116    Subjective Nothing differenet since I was here yesterday.   Pertinent History breast cancer with right  mastectomy with 20 lymph nodes removed in january 2017, chemo with peripheral neuropathy  and  had to have more skin removed before radiation radiation with skin disruption which was concluded  last of October.  Past history includes multiple levels of degenerative disc disease in neck and spine and 4 knee surgeries on each knee with the last one in October 2016    Currently in Pain? Yes   Pain Score 4    Pain Location Flank   Pain Orientation Right   Pain Descriptors / Indicators Sharp   Pain Type Chronic pain   Pain Radiating Towards towards axilla   Pain Onset More than a month ago   Pain Frequency Intermittent   Aggravating Factors  moving alot, prosthesis rubbing over it   Pain Relieving Factors pain meds               LYMPHEDEMA/ONCOLOGY QUESTIONNAIRE - 05/11/17 1553      Type   Cancer Type Right breast cancer     Surgeries   Mastectomy Date 08/02/15   Axillary Lymph Node Dissection Date 08/02/15   Number Lymph Nodes Removed 21     Date Lymphedema/Swelling Started   Date 03/11/17     Treatment   Active Chemotherapy Treatment No   Past Chemotherapy Treatment Yes   Active Radiation Treatment No   Past Radiation Treatment Yes   Current Hormone Treatment Yes   Drug Name Arimidex     What other symptoms do you have   Are you Having Heaviness or Tightness Yes   Are you having Pain Yes   Are you having pitting edema No   Is it Hard or Difficult finding clothes that fit Yes   Do you have infections No   Is there Decreased scar mobility Yes     Lymphedema Assessments   Lymphedema Assessments Upper extremities     Right Upper Extremity Lymphedema   15 cm Proximal to Olecranon Process 38.5 cm   10 cm Proximal to Olecranon Process 37.5 cm   Olecranon Process 34 cm   15 cm Proximal to Ulnar Styloid Process 30.5 cm   10 cm Proximal to Ulnar Styloid Process 26.2 cm   Just Proximal to Ulnar Styloid Process 21 cm   Across Hand at [employer] 22.5 cm   At [place] of 2nd Digit 8.1 cm     Left Upper Extremity Lymphedema   15 cm Proximal to Olecranon Process 38.5 cm   10 cm Proximal to Olecranon Process 38 cm   Olecranon Process 33 cm   15 cm Proximal  to Ulnar Styloid Process 31 cm   10 cm Proximal to Ulnar Styloid Process 26.3 cm   Just Proximal to Ulnar Styloid Process 20.1 cm   Across Hand at [employer] 24.2 cm   At [place] of 2nd Digit 7.8 cm                  [facility] Adult PT Treatment/Exercise - 05/12/17 0001      Shoulder Exercises: Supine   Horizontal ABduction AAROM;Right;5 reps  5 second holds with dowel   External Rotation AAROM;Left;5 reps  5 second holds with dowel   Flexion AAROM;Both;5 reps  5 second holds with dowel  Manual Therapy   Manual Therapy Manual Lymphatic Drainage (MLD);Edema management   Edema Management Made chip pack for Rt lateral trunk area near axilla and pt wore this in bra at end of session.   Manual Lymphatic Drainage (MLD) In Supine: Short neck, superficial and deep abdominals, Rt inguinal and Lt axillary nodes, Rt axillo-inguinal and anterior inter-axillary anstomosis then Rt UE from dorsal hand to lateral shoulder working from proximal to distal and retracing all steps, also focusing at Rt lateral trunk inferior to axilla where pt c/o most pain/swelling, then into Lt S/L for continued work to [employer] axillo-inguinal and posterior inter-axillary anastomosis, finished in supine retracing pathways.                 PT Education - 05/12/17 1124    Education provided (P)  Yes   Education Details (P)  Supine cane exercises for AA/ROM stretching   Person(s) Educated (P)  Patient   Methods (P)  Explanation;Demonstration;Handout   Comprehension (P)  Verbalized understanding;Returned demonstration;Need further instruction                [facility] Goals - 05/11/17 1713      CC Long Term Goal  #1   Title Patient with verbalize an understanding of lymphedema risk reduction precautions   Time 4   Period Weeks   Status New   Target Date 06/08/17     CC Long Term Goal  #2   Title Patient will be independent in a home exercise program for continued strengthening and  stretching   Time 4   Period Weeks   Status New   Target Date 06/08/17     CC Long Term Goal  #3   Title Patient will improve left shoulder abduction to 130 degrees with no pain  so that she can perform dressing tasks indpendently    Baseline 92   Time 4   Period Weeks   Status New   Target Date 06/08/17     CC Long Term Goal  #4   Title Pt will demonstrate 130 degrees of right shoulder flexion to allow her to reach items overhead   Baseline 85   Time 4   Period Weeks   Status New   Target Date 06/08/17     CC Long Term Goal  #5   Title Pt will obtain appropriate compression bra for long term managment of lymphedema, and an appropriate compression sleeve for prophylactic use   Time 4   Period Weeks   Status New            Plan - 05/12/17 1212    Clinical Impression Statement Today was first session of treatment for manual lymph drainage and also instructed an dissued supine cane exercises for AA/ROM stretching. Pt tolerated both very well and reported her Rt hsoulder feeling looser and swelling feeling less prominent at end of session. Also made a chip pack which was situated in her bra at lateral trunk swelling and pt reported this feeling much more comfortable as the friction was much decreased.    Rehab Potential Good   Clinical Impairments Affecting Rehab Potential 20 lymph nodes removed, previous radiation    PT Frequency 3x / week   PT Duration 4 weeks   PT Treatment/Interventions ADLs/Self Care Home Management;Patient/family education;Manual techniques;Scar mobilization;Passive range of motion;Therapeutic activities;Therapeutic exercise;Manual lymph drainage;Taping;Vasopneumatic Device   PT Next Visit Plan Assess chip pack, assist pt with compression bra, continue MLD to Rt trunk, and review  supine dowel exercises   Recommended Other Services Script sent to Dr. Jana Hakim for compression bra and new sleeve (class I).   Consulted and Agree with Plan of Care Patient       Patient will benefit from skilled therapeutic intervention in order to improve the following deficits and impairments:  Decreased knowledge of precautions, Decreased scar mobility, Decreased strength, Pain, Impaired UE functional use, Increased fascial restricitons, Postural dysfunction, Decreased range of motion, Increased edema  Visit Diagnosis: Postmastectomy lymphedema  Stiffness of right shoulder, not elsewhere classified  Acute pain of right shoulder  Stiffness of left shoulder, not elsewhere classified       G-Codes - May 12, 2017 1718    Functional Assessment Tool Used (Outpatient Only) quick dash   Functional Limitation Carrying, moving and handling objects   Carrying, Moving and Handling Objects Current Status (F8101) At least 60 percent but less than 80 percent impaired, limited or restricted   Carrying, Moving and Handling Objects Goal Status (B5102) At least 20 percent but less than 40 percent impaired, limited or restricted      Problem List Patient Active Problem List   Diagnosis Date Noted  . Insomnia 07/16/2016  . Left groin pain 11/30/2015  . Severe obesity (BMI >= 40) ([place]) 10/26/2015  . Malignant neoplasm of lower-inner quadrant of right breast of female, estrogen receptor positive ([place]) 08/08/2015  . Diabetes mellitus without complication ([place]) 58/52/7782  . Paraspinous mass, left 03/31/2013  . Internal hemorrhoid 02/17/2013  . Abdominal pain, other specified site 02/17/2013  . Neoplasm of soft tissue, left shoulder and left upper arm 08/03/2012  . Lightheadedness 01/29/2012  . Cholelithiasis with cholecystitis 10/08/2011  . Hypokalemia 09/17/2011  . GERD 08/08/2010  . GOUT 12/18/2008  . Essential hypertension 03/15/2007  . HEADACHE 03/15/2007    Otelia Limes, PTA 05/12/2017, 12:22 PM  [place], [place], [address] Phone: [phone]   Fax:   [phone]  Name: BURMA KETCHER MRN: [id number] Date of Birth: 07-Feb-1961

## 2017-05-12 NOTE — Patient Instructions (Signed)

## 2017-05-15 ENCOUNTER — Ambulatory Visit: Payer: 59 | Admitting: Physical Therapy

## 2017-05-15 ENCOUNTER — Encounter: Payer: Self-pay | Admitting: Physical Therapy

## 2017-05-15 DIAGNOSIS — I972 Postmastectomy lymphedema syndrome: Secondary | ICD-10-CM

## 2017-05-15 NOTE — Therapy (Signed)
[place], [place], [address] Phone: [phone]   Fax:  [phone]  Physical Therapy Treatment  Patient Details  Name: [name] MRN: [id number] Date of Birth: 16-Oct-1960 Referring Provider: Dr. Jana Hakim  Encounter Date: 05/15/2017      PT End of Session - 05/15/17 1157    Visit Number 3   Number of Visits 13   Date for PT Re-Evaluation 06/08/17   PT Start Time 1021   PT Stop Time 1103   PT Time Calculation (min) 42 min   Activity Tolerance Patient tolerated treatment well   Behavior During Therapy [facility] for tasks assessed/performed      Past Medical History:  Diagnosis Date  . Breast cancer ([place])   . Breast cancer of lower-inner quadrant of right female breast ([place]) 08/08/2015  . Colon polyps   . DDD (degenerative disc disease), cervical   . Degenerative joint disease   . Diabetes mellitus without complication ([place])   . Diverticulosis   . Gallstones   . GERD (gastroesophageal reflux disease)   . Headache(784.0)    migraine like per patient  . Hypertension   . Low back pain    dr phillips, pain management  . Osteoarthritis     Past Surgical History:  Procedure Laterality Date  . CHOLECYSTECTOMY  10/28/2011   Procedure: LAPAROSCOPIC CHOLECYSTECTOMY WITH INTRAOPERATIVE CHOLANGIOGRAM;  Surgeon: Earnstine Regal, MD;  [facility];  Service: General;  Laterality: N/A;  . COLONOSCOPY WITH ESOPHAGOGASTRODUODENOSCOPY (EGD)  02/03/2013   with Propofol  . EXAM UNDER ANESTHESIA WITH MANIPULATION OF KNEE Right 05/30/2003  . EXAM UNDER ANESTHESIA WITH MANIPULATION OF KNEE Left 12/27/2002  . GANGLION CYST EXCISION Right    right wrist  . KNEE ARTHROSCOPY Bilateral 01/02/2000  . KNEE ARTHROSCOPY Right   . LAPAROSCOPIC VAGINAL HYSTERECTOMY WITH SALPINGO OOPHORECTOMY Bilateral 07/13/2006  . LIPOMA EXCISION Right 02/21/2008   hip  . MASS EXCISION  08/24/2012   Procedure: EXCISION MASS;   Surgeon: Earnstine Regal, MD;  Location: [place];  Service: General;  Laterality: Left;  excisie soft tissue masses left shoulder(back) & left upper arm  . MASS EXCISION Left 01/24/2014   Procedure: EXCISION SOFT TISSUE MASSES LOWER LEFT BACK;  Surgeon: Earnstine Regal, MD;  Location: [place];  Service: General;  Laterality: Left;  Marland Kitchen MASS EXCISION Right 04/30/2016   Procedure: EXCISION OF SKIN RIGHT CHEST WALL;  Surgeon: Autumn Messing III, MD;  Location: [place];  Service: General;  Laterality: Right;  EXCISION OF SKIN RIGHT CHEST WALL  . MASTECTOMY W/ SENTINEL NODE BIOPSY Right 10/01/2015   Procedure: RIGHT MASTECTOMY WITH SENTINEL LYMPH NODE BIOPSY;  Surgeon: Autumn Messing III, MD;  Location: [place];  Service: General;  Laterality: Right;  . PORT-A-CATH REMOVAL Left 04/30/2016   Procedure: REMOVAL PORT-A-CATH;  Surgeon: Autumn Messing III, MD;  Location: [place];  Service: General;  Laterality: Left;  REMOVAL PORT-A-CATH  . PORTACATH PLACEMENT Left 11/01/2015   Procedure: INSERTION PORT-A-CATH;  Surgeon: Autumn Messing III, MD;  Location: [place];  Service: General;  Laterality: Left;  . REPLACEMENT UNICONDYLAR JOINT KNEE Left 04/20/2001  . REVISION TOTAL KNEE ARTHROPLASTY Right 06/18/2004  . REVISION TOTAL KNEE ARTHROPLASTY Left 11/15/2002  . REVISION TOTAL KNEE ARTHROPLASTY Left 10/12/2001  . REVISION TOTAL KNEE ARTHROPLASTY Right 07/09/2015  . TONSILLECTOMY    . TOTAL KNEE ARTHROPLASTY Right 04/25/2003  . TOTAL KNEE ARTHROPLASTY Left  06/25/2009    There were no vitals filed for this visit.      Subjective Assessment - 05/15/17 1023    Subjective I htink my swelling is about the same. I have right much pain under this armpit. The chip pack is helping a lot especially in the daytime when I am moving around.    Pertinent History breast cancer with right  mastectomy with 20 lymph nodes removed in january 2017, chemo with peripheral  neuropathy  and  had to have more skin removed before radiation radiation with skin disruption which was concluded last of October.  Past history includes multiple levels of degenerative disc disease in neck and spine and 4 knee surgeries on each knee with the last one in October 2016    Patient Stated Goals to get some range of motion in my right arm, tell me what I am doing wrong to cause this swelling in my chest    Pain Score 4    Pain Location Flank   Pain Orientation Right   Pain Descriptors / Indicators Sharp   Pain Type Chronic pain                         [facility] Adult PT Treatment/Exercise - 05/15/17 0001      Manual Therapy   Manual Therapy Manual Lymphatic Drainage (MLD)   Manual Lymphatic Drainage (MLD) In Supine: Short neck, superficial and deep abdominals, Rt inguinal and Lt axillary nodes, Rt axillo-inguinal and anterior inter-axillary anstomosis then Rt UE from dorsal hand to lateral shoulder working from proximal to distal and retracing all steps, also focusing at Rt lateral trunk inferior to axilla where pt c/o most pain/swelling, then into Lt S/L for continued work to [employer] axillo-inguinal and posterior inter-axillary anastomosis, finished in supine retracing pathways.                         [facility] Goals - 05/11/17 1713      CC Long Term Goal  #1   Title Patient with verbalize an understanding of lymphedema risk reduction precautions   Time 4   Period Weeks   Status New   Target Date 06/08/17     CC Long Term Goal  #2   Title Patient will be independent in a home exercise program for continued strengthening and stretching   Time 4   Period Weeks   Status New   Target Date 06/08/17     CC Long Term Goal  #3   Title Patient will improve left shoulder abduction to 130 degrees with no pain  so that she can perform dressing tasks indpendently    Baseline 92   Time 4   Period Weeks   Status New   Target Date 06/08/17     CC  Long Term Goal  #4   Title Pt will demonstrate 130 degrees of right shoulder flexion to allow her to reach items overhead   Baseline 85   Time 4   Period Weeks   Status New   Target Date 06/08/17     CC Long Term Goal  #5   Title Pt will obtain appropriate compression bra for long term managment of lymphedema, and an appropriate compression sleeve for prophylactic use   Time 4   Period Weeks   Status New            Plan - 05/15/17 1157  Clinical Impression Statement Continued MLD to pt's right trunk today in area of pain. She has been wearing the chip pack and states it has been helping. Briefly performed myofascial release to area of tightness in superior right chest. Issued pt prescription with doctor signature to obtain compression bra, sleeve and glove.    Rehab Potential Good   Clinical Impairments Affecting Rehab Potential 20 lymph nodes removed, previous radiation    PT Frequency 3x / week   PT Duration 4 weeks   PT Treatment/Interventions ADLs/Self Care Home Management;Patient/family education;Manual techniques;Scar mobilization;Passive range of motion;Therapeutic activities;Therapeutic exercise;Manual lymph drainage;Taping;Vasopneumatic Device   PT Next Visit Plan continue MLD to Rt trunk, and review supine dowel exercises   Consulted and Agree with Plan of Care Patient      Patient will benefit from skilled therapeutic intervention in order to improve the following deficits and impairments:  Decreased knowledge of precautions, Decreased scar mobility, Decreased strength, Pain, Impaired UE functional use, Increased fascial restricitons, Postural dysfunction, Decreased range of motion, Increased edema  Visit Diagnosis: Postmastectomy lymphedema     Problem List Patient Active Problem List   Diagnosis Date Noted  . Insomnia 07/16/2016  . Left groin pain 11/30/2015  . Severe obesity (BMI >= 40) ([place]) 10/26/2015  . Malignant neoplasm of lower-inner quadrant of  right breast of female, estrogen receptor positive ([place]) 08/08/2015  . Diabetes mellitus without complication ([place]) 48/27/0786  . Paraspinous mass, left 03/31/2013  . Internal hemorrhoid 02/17/2013  . Abdominal pain, other specified site 02/17/2013  . Neoplasm of soft tissue, left shoulder and left upper arm 08/03/2012  . Lightheadedness 01/29/2012  . Cholelithiasis with cholecystitis 10/08/2011  . Hypokalemia 09/17/2011  . GERD 08/08/2010  . GOUT 12/18/2008  . Essential hypertension 03/15/2007  . HEADACHE 03/15/2007    Allyson Sabal [facility] 05/15/2017, 12:01 PM  [place], [place], [address] Phone: [phone]   Fax:  [phone]  Name: ODETH BRY MRN: [id number] Date of Birth: 12/30/1960  Manus Gunning, PT 05/15/17 12:02 PM

## 2017-05-20 ENCOUNTER — Ambulatory Visit: Payer: 59

## 2017-05-20 DIAGNOSIS — I972 Postmastectomy lymphedema syndrome: Secondary | ICD-10-CM

## 2017-05-20 DIAGNOSIS — M25611 Stiffness of right shoulder, not elsewhere classified: Secondary | ICD-10-CM

## 2017-05-20 DIAGNOSIS — M25511 Pain in right shoulder: Secondary | ICD-10-CM

## 2017-05-20 NOTE — Therapy (Signed)
[place], [place], [address] Phone: [phone]   Fax:  [phone]  Physical Therapy Treatment  Patient Details  Name: [name] MRN: [id number] Date of Birth: 26-Jun-1961 Referring Provider: Dr. Jana Hakim  Encounter Date: 05/20/2017      PT End of Session - 05/20/17 1603    Visit Number 4   Number of Visits 13   Date for PT Re-Evaluation 06/08/17   PT Start Time 1522   PT Stop Time 1603   PT Time Calculation (min) 41 min   Activity Tolerance Patient tolerated treatment well   Behavior During Therapy [facility] for tasks assessed/performed      Past Medical History:  Diagnosis Date  . Breast cancer ([place])   . Breast cancer of lower-inner quadrant of right female breast ([place]) 08/08/2015  . Colon polyps   . DDD (degenerative disc disease), cervical   . Degenerative joint disease   . Diabetes mellitus without complication ([place])   . Diverticulosis   . Gallstones   . GERD (gastroesophageal reflux disease)   . Headache(784.0)    migraine like per patient  . Hypertension   . Low back pain    dr phillips, pain management  . Osteoarthritis     Past Surgical History:  Procedure Laterality Date  . CHOLECYSTECTOMY  10/28/2011   Procedure: LAPAROSCOPIC CHOLECYSTECTOMY WITH INTRAOPERATIVE CHOLANGIOGRAM;  Surgeon: Earnstine Regal, MD;  [facility];  Service: General;  Laterality: N/A;  . COLONOSCOPY WITH ESOPHAGOGASTRODUODENOSCOPY (EGD)  02/03/2013   with Propofol  . EXAM UNDER ANESTHESIA WITH MANIPULATION OF KNEE Right 05/30/2003  . EXAM UNDER ANESTHESIA WITH MANIPULATION OF KNEE Left 12/27/2002  . GANGLION CYST EXCISION Right    right wrist  . KNEE ARTHROSCOPY Bilateral 01/02/2000  . KNEE ARTHROSCOPY Right   . LAPAROSCOPIC VAGINAL HYSTERECTOMY WITH SALPINGO OOPHORECTOMY Bilateral 07/13/2006  . LIPOMA EXCISION Right 02/21/2008   hip  . MASS EXCISION  08/24/2012   Procedure: EXCISION MASS;   Surgeon: Earnstine Regal, MD;  Location: [place];  Service: General;  Laterality: Left;  excisie soft tissue masses left shoulder(back) & left upper arm  . MASS EXCISION Left 01/24/2014   Procedure: EXCISION SOFT TISSUE MASSES LOWER LEFT BACK;  Surgeon: Earnstine Regal, MD;  Location: [place];  Service: General;  Laterality: Left;  Marland Kitchen MASS EXCISION Right 04/30/2016   Procedure: EXCISION OF SKIN RIGHT CHEST WALL;  Surgeon: Autumn Messing III, MD;  Location: [place];  Service: General;  Laterality: Right;  EXCISION OF SKIN RIGHT CHEST WALL  . MASTECTOMY W/ SENTINEL NODE BIOPSY Right 10/01/2015   Procedure: RIGHT MASTECTOMY WITH SENTINEL LYMPH NODE BIOPSY;  Surgeon: Autumn Messing III, MD;  Location: [place];  Service: General;  Laterality: Right;  . PORT-A-CATH REMOVAL Left 04/30/2016   Procedure: REMOVAL PORT-A-CATH;  Surgeon: Autumn Messing III, MD;  Location: [place];  Service: General;  Laterality: Left;  REMOVAL PORT-A-CATH  . PORTACATH PLACEMENT Left 11/01/2015   Procedure: INSERTION PORT-A-CATH;  Surgeon: Autumn Messing III, MD;  Location: [place];  Service: General;  Laterality: Left;  . REPLACEMENT UNICONDYLAR JOINT KNEE Left 04/20/2001  . REVISION TOTAL KNEE ARTHROPLASTY Right 06/18/2004  . REVISION TOTAL KNEE ARTHROPLASTY Left 11/15/2002  . REVISION TOTAL KNEE ARTHROPLASTY Left 10/12/2001  . REVISION TOTAL KNEE ARTHROPLASTY Right 07/09/2015  . TONSILLECTOMY    . TOTAL KNEE ARTHROPLASTY Right 04/25/2003  . TOTAL KNEE ARTHROPLASTY Left  06/25/2009    There were no vitals filed for this visit.      Subjective Assessment - 05/20/17 1526    Subjective My swelling is kind of up and down. The chip pack you gave me helps, some mornings it does feel better when I wake up and I've been having my husband work on my scar tissue like you showed me too and that has also been helping me feel better some. My wrist have been bothering me and I  looked up anastrozole and it causes wrist pain, so I guess that's what that's from. [facility] Sept 19 and I'll call [facility] tomorrow to get an appt for a compresison sleeve and glove.    Pertinent History breast cancer with right  mastectomy with 20 lymph nodes removed in january 2017, chemo with peripheral neuropathy  and  had to have more skin removed before radiation radiation with skin disruption which was concluded last of October.  Past history includes multiple levels of degenerative disc disease in neck and spine and 4 knee surgeries on each knee with the last one in October 2016    Patient Stated Goals to get some range of motion in my right arm, tell me what I am doing wrong to cause this swelling in my chest    Currently in Pain? Yes   Pain Score 3    Pain Location Wrist   Pain Orientation Right;Left   Pain Descriptors / Indicators Aching   Pain Type Acute pain   Pain Onset More than a month ago   Pain Frequency Intermittent   Aggravating Factors  the anastrozole   Pain Relieving Factors just try to keep moving so I don't get stiff               LYMPHEDEMA/ONCOLOGY QUESTIONNAIRE - 05/20/17 1556      Right Upper Extremity Lymphedema   15 cm Proximal to Olecranon Process 37.5 cm   10 cm Proximal to Olecranon Process 36.6 cm   Olecranon Process 31.2 cm   15 cm Proximal to Ulnar Styloid Process 31.2 cm   10 cm Proximal to Ulnar Styloid Process 27.5 cm   Just Proximal to Ulnar Styloid Process 20.7 cm   Across Hand at [employer] 23.8 cm   At [place] of 2nd Digit 7.9 cm                  [facility] Adult PT Treatment/Exercise - 05/20/17 0001      Manual Therapy   Manual Therapy Myofascial release;Manual Lymphatic Drainage (MLD)   Myofascial Release Gently to Rt chest wall and scar tissue/incision   Manual Lymphatic Drainage (MLD) In Supine: Short neck, superficial and deep abdominals, Rt inguinal and Lt axillary nodes, Rt  axillo-inguinal and anterior inter-axillary anstomosis then Rt UE from dorsal hand to lateral shoulder working from proximal to distal and retracing all steps, also focusing at Rt lateral trunk inferior to axilla where pt c/o most pain/swelling, then into Lt S/L for continued work to [employer] axillo-inguinal and posterior inter-axillary anastomosis, finished in supine retracing pathways.                         [place] - 05/11/17 1713      CC Long Term Goal  #1   Title Patient with verbalize an understanding of lymphedema risk reduction precautions   Time 4   Period Weeks  Status New   Target Date 06/08/17     CC Long Term Goal  #2   Title Patient will be independent in a home exercise program for continued strengthening and stretching   Time 4   Period Weeks   Status New   Target Date 06/08/17     CC Long Term Goal  #3   Title Patient will improve left shoulder abduction to 130 degrees with no pain  so that she can perform dressing tasks indpendently    Baseline 92   Time 4   Period Weeks   Status New   Target Date 06/08/17     CC Long Term Goal  #4   Title Pt will demonstrate 130 degrees of right shoulder flexion to allow her to reach items overhead   Baseline 85   Time 4   Period Weeks   Status New   Target Date 06/08/17     CC Long Term Goal  #5   Title Pt will obtain appropriate compression bra for long term managment of lymphedema, and an appropriate compression sleeve for prophylactic use   Time 4   Period Weeks   Status New            Plan - 05/20/17 1603    Clinical Impression Statement Did not review supine dowel exs today as pts bil wrists have been hurting lately she thinks from the anastrozole but she reports has been doing them when she can without issue. Continued with manual lymph drainage today and pt did seem to have increased circumference at her forearm and Rt lateral trunk. Her circumference measurements of UE supported  this so instructed pt in importance of getting measured for new compression sleeve and glove soon, she reports wil call tomorrow to make an appt at [facility].    Rehab Potential Good   Clinical Impairments Affecting Rehab Potential 20 lymph nodes removed, previous radiation    PT Frequency 3x / week   PT Duration 4 weeks   PT Treatment/Interventions ADLs/Self Care Home Management;Patient/family education;Manual techniques;Scar mobilization;Passive range of motion;Therapeutic activities;Therapeutic exercise;Manual lymph drainage;Taping;Vasopneumatic Device   PT Next Visit Plan continue MLD to Rt trunk, and review supine dowel exercises; see if pt got appt for compression sleeve/glove.    Consulted and Agree with Plan of Care Patient      Patient will benefit from skilled therapeutic intervention in order to improve the following deficits and impairments:  Decreased knowledge of precautions, Decreased scar mobility, Decreased strength, Pain, Impaired UE functional use, Increased fascial restricitons, Postural dysfunction, Decreased range of motion, Increased edema  Visit Diagnosis: Postmastectomy lymphedema  Stiffness of right shoulder, not elsewhere classified  Acute pain of right shoulder     Problem List Patient Active Problem List   Diagnosis Date Noted  . Insomnia 07/16/2016  . Left groin pain 11/30/2015  . Severe obesity (BMI >= 40) ([place]) 10/26/2015  . Malignant neoplasm of lower-inner quadrant of right breast of female, estrogen receptor positive ([place]) 08/08/2015  . Diabetes mellitus without complication ([place]) 54/00/8676  . Paraspinous mass, left 03/31/2013  . Internal hemorrhoid 02/17/2013  . Abdominal pain, other specified site 02/17/2013  . Neoplasm of soft tissue, left shoulder and left upper arm 08/03/2012  . Lightheadedness 01/29/2012  . Cholelithiasis with cholecystitis 10/08/2011  . Hypokalemia 09/17/2011  . GERD 08/08/2010  . GOUT 12/18/2008  . Essential  hypertension 03/15/2007  . HEADACHE 03/15/2007    Otelia Limes, PTA 05/20/2017, 4:07 PM  [facility]  [place], [place], [address] Phone: [phone]   Fax:  [phone]  Name: [name] MRN: [id number] Date of Birth: 24-Sep-1960

## 2017-05-22 ENCOUNTER — Ambulatory Visit: Payer: 59 | Admitting: Physical Therapy

## 2017-05-22 DIAGNOSIS — I972 Postmastectomy lymphedema syndrome: Secondary | ICD-10-CM

## 2017-05-22 DIAGNOSIS — M25611 Stiffness of right shoulder, not elsewhere classified: Secondary | ICD-10-CM

## 2017-05-22 DIAGNOSIS — M6281 Muscle weakness (generalized): Secondary | ICD-10-CM

## 2017-05-22 DIAGNOSIS — M25511 Pain in right shoulder: Secondary | ICD-10-CM

## 2017-05-22 NOTE — Patient Instructions (Signed)

## 2017-05-22 NOTE — Therapy (Signed)
[place], [place], [address] Phone: [phone]   Fax:  [phone]  Physical Therapy Treatment  Patient Details  Name: [name] MRN: [id number] Date of Birth: 1960-11-01 Referring Provider: Dr. Jana Hakim  Encounter Date: 05/22/2017      PT End of Session - 05/22/17 1228    Visit Number 5   Number of Visits 13   Date for PT Re-Evaluation 06/08/17   PT Start Time 1020   PT Stop Time 1105   PT Time Calculation (min) 45 min   Activity Tolerance Patient tolerated treatment well   Behavior During Therapy [facility] for tasks assessed/performed      Past Medical History:  Diagnosis Date  . Breast cancer ([place])   . Breast cancer of lower-inner quadrant of right female breast ([place]) 08/08/2015  . Colon polyps   . DDD (degenerative disc disease), cervical   . Degenerative joint disease   . Diabetes mellitus without complication ([place])   . Diverticulosis   . Gallstones   . GERD (gastroesophageal reflux disease)   . Headache(784.0)    migraine like per patient  . Hypertension   . Low back pain    dr phillips, pain management  . Osteoarthritis     Past Surgical History:  Procedure Laterality Date  . CHOLECYSTECTOMY  10/28/2011   Procedure: LAPAROSCOPIC CHOLECYSTECTOMY WITH INTRAOPERATIVE CHOLANGIOGRAM;  Surgeon: Earnstine Regal, MD;  [facility];  Service: General;  Laterality: N/A;  . COLONOSCOPY WITH ESOPHAGOGASTRODUODENOSCOPY (EGD)  02/03/2013   with Propofol  . EXAM UNDER ANESTHESIA WITH MANIPULATION OF KNEE Right 05/30/2003  . EXAM UNDER ANESTHESIA WITH MANIPULATION OF KNEE Left 12/27/2002  . GANGLION CYST EXCISION Right    right wrist  . KNEE ARTHROSCOPY Bilateral 01/02/2000  . KNEE ARTHROSCOPY Right   . LAPAROSCOPIC VAGINAL HYSTERECTOMY WITH SALPINGO OOPHORECTOMY Bilateral 07/13/2006  . LIPOMA EXCISION Right 02/21/2008   hip  . MASS EXCISION  08/24/2012   Procedure: EXCISION MASS;   Surgeon: Earnstine Regal, MD;  Location: [place];  Service: General;  Laterality: Left;  excisie soft tissue masses left shoulder(back) & left upper arm  . MASS EXCISION Left 01/24/2014   Procedure: EXCISION SOFT TISSUE MASSES LOWER LEFT BACK;  Surgeon: Earnstine Regal, MD;  Location: [place];  Service: General;  Laterality: Left;  Marland Kitchen MASS EXCISION Right 04/30/2016   Procedure: EXCISION OF SKIN RIGHT CHEST WALL;  Surgeon: Autumn Messing III, MD;  Location: [place];  Service: General;  Laterality: Right;  EXCISION OF SKIN RIGHT CHEST WALL  . MASTECTOMY W/ SENTINEL NODE BIOPSY Right 10/01/2015   Procedure: RIGHT MASTECTOMY WITH SENTINEL LYMPH NODE BIOPSY;  Surgeon: Autumn Messing III, MD;  Location: [place];  Service: General;  Laterality: Right;  . PORT-A-CATH REMOVAL Left 04/30/2016   Procedure: REMOVAL PORT-A-CATH;  Surgeon: Autumn Messing III, MD;  Location: [place];  Service: General;  Laterality: Left;  REMOVAL PORT-A-CATH  . PORTACATH PLACEMENT Left 11/01/2015   Procedure: INSERTION PORT-A-CATH;  Surgeon: Autumn Messing III, MD;  Location: [place];  Service: General;  Laterality: Left;  . REPLACEMENT UNICONDYLAR JOINT KNEE Left 04/20/2001  . REVISION TOTAL KNEE ARTHROPLASTY Right 06/18/2004  . REVISION TOTAL KNEE ARTHROPLASTY Left 11/15/2002  . REVISION TOTAL KNEE ARTHROPLASTY Left 10/12/2001  . REVISION TOTAL KNEE ARTHROPLASTY Right 07/09/2015  . TONSILLECTOMY    . TOTAL KNEE ARTHROPLASTY Right 04/25/2003  . TOTAL KNEE ARTHROPLASTY Left  06/25/2009    There were no vitals filed for this visit.      Subjective Assessment - 05/22/17 1029    Subjective I feel a little stiff. My arm gets a little a heavy in the evening.  She has an appointment to get a compression bra on Sept 19    Pertinent History breast cancer with right  mastectomy with 20 lymph nodes removed in january 2017, chemo with peripheral neuropathy  and  had to have more  skin removed before radiation radiation with skin disruption which was concluded last of October.  Past history includes multiple levels of degenerative disc disease in neck and spine and 4 knee surgeries on each knee with the last one in October 2016    Patient Stated Goals to get some range of motion in my right arm, tell me what I am doing wrong to cause this swelling in my chest    Currently in Pain? No/denies                         [facility] Adult PT Treatment/Exercise - 05/22/17 0001      Shoulder Exercises: Supine   Protraction AROM;Right;10 reps   Other Supine Exercises small controlled circles with arm pointing to ceiling and larger circles within painfree range.      Shoulder Exercises: Sidelying   External Rotation AROM;Right;10 reps  with pillow at waist   ABduction AROM;Right;5 reps  limted by pain    Other Sidelying Exercises attempted circles, but pt unable to perform due to pain      Shoulder Exercises: Isometric Strengthening   Flexion 5X5";Other (comment)   Flexion Limitations in standing    Extension 5X5"   External Rotation 3X5"   External Rotation Limitations in standing and also in sidelying with manual resistance   ABduction 5X5"     Manual Therapy   Manual Therapy Myofascial release;Manual Lymphatic Drainage (MLD);Passive ROM   Myofascial Release Gently to Rt chest wall and scar tissue/incision Also to tender trigger points in axilla and back area.    Manual Lymphatic Drainage (MLD) briefly to right chest in supine and right back in sidelying    Passive ROM to right shoulder into flexion, abduction and rotation as tolerted                 PT Education - 05/22/17 1227    Education provided Yes   Education Details standing shoulder isometrics    Person(s) Educated Patient   Methods Explanation;Demonstration;Tactile cues;Verbal cues;Handout   Comprehension Verbalized understanding;Returned demonstration                [facility] Goals - 05/11/17 1713      CC Long Term Goal  #1   Title Patient with verbalize an understanding of lymphedema risk reduction precautions   Time 4   Period Weeks   Status New   Target Date 06/08/17     CC Long Term Goal  #2   Title Patient will be independent in a home exercise program for continued strengthening and stretching   Time 4   Period Weeks   Status New   Target Date 06/08/17     CC Long Term Goal  #3   Title Patient will improve left shoulder abduction to 130 degrees with no pain  so that she can perform dressing tasks indpendently    Baseline 92   Time 4   Period Weeks   Status  New   Target Date 06/08/17     CC Long Term Goal  #4   Title Pt will demonstrate 130 degrees of right shoulder flexion to allow her to reach items overhead   Baseline 85   Time 4   Period Weeks   Status New   Target Date 06/08/17     CC Long Term Goal  #5   Title Pt will obtain appropriate compression bra for long term managment of lymphedema, and an appropriate compression sleeve for prophylactic use   Time 4   Period Weeks   Status New            Plan - 05/22/17 1228    Clinical Impression Statement Pt is receiving benefit from chip pack to lateral chest. Focused on shoulder ROM and strenthening today along with MLD to anterior and lateral chest.  She also received myofascial and trigger point techniques to tight areas in and around axilla that may be contributing to her painful shoulder movment. She was able to tolerate isometric exercises to facilitate normal muscular movement.    Clinical Impairments Affecting Rehab Potential 20 lymph nodes removed, previous radiation    PT Next Visit Plan Continue myofascial work to right upper quadrant and axilla, promoting AROM ( consider use of UE ranger) and MLD    Consulted and Agree with Plan of Care Patient      Patient will benefit from skilled therapeutic intervention in order to improve the following deficits and  impairments:  Decreased knowledge of precautions, Decreased scar mobility, Decreased strength, Pain, Impaired UE functional use, Increased fascial restricitons, Postural dysfunction, Decreased range of motion, Increased edema  Visit Diagnosis: Postmastectomy lymphedema  Stiffness of right shoulder, not elsewhere classified  Acute pain of right shoulder  Muscle weakness (generalized)     Problem List Patient Active Problem List   Diagnosis Date Noted  . Insomnia 07/16/2016  . Left groin pain 11/30/2015  . Severe obesity (BMI >= 40) ([place]) 10/26/2015  . Malignant neoplasm of lower-inner quadrant of right breast of female, estrogen receptor positive ([place]) 08/08/2015  . Diabetes mellitus without complication ([place]) 47/42/5956  . Paraspinous mass, left 03/31/2013  . Internal hemorrhoid 02/17/2013  . Abdominal pain, other specified site 02/17/2013  . Neoplasm of soft tissue, left shoulder and left upper arm 08/03/2012  . Lightheadedness 01/29/2012  . Cholelithiasis with cholecystitis 10/08/2011  . Hypokalemia 09/17/2011  . GERD 08/08/2010  . GOUT 12/18/2008  . Essential hypertension 03/15/2007  . HEADACHE 03/15/2007   Donato Heinz. Owens Shark PT  Norwood Levo 05/22/2017, 12:35 PM  [place], [place], [address] Phone: [phone]   Fax:  [phone]  Name: [name] MRN: [id number] Date of Birth: [birth year]

## 2017-05-27 ENCOUNTER — Ambulatory Visit: Payer: 59 | Attending: Oncology

## 2017-05-27 DIAGNOSIS — R293 Abnormal posture: Secondary | ICD-10-CM | POA: Insufficient documentation

## 2017-05-27 DIAGNOSIS — M6281 Muscle weakness (generalized): Secondary | ICD-10-CM | POA: Diagnosis present

## 2017-05-27 DIAGNOSIS — M25612 Stiffness of left shoulder, not elsewhere classified: Secondary | ICD-10-CM | POA: Insufficient documentation

## 2017-05-27 DIAGNOSIS — M25511 Pain in right shoulder: Secondary | ICD-10-CM | POA: Diagnosis not present

## 2017-05-27 DIAGNOSIS — I972 Postmastectomy lymphedema syndrome: Secondary | ICD-10-CM | POA: Diagnosis not present

## 2017-05-27 DIAGNOSIS — M79621 Pain in right upper arm: Secondary | ICD-10-CM | POA: Diagnosis present

## 2017-05-27 DIAGNOSIS — L599 Disorder of the skin and subcutaneous tissue related to radiation, unspecified: Secondary | ICD-10-CM | POA: Insufficient documentation

## 2017-05-27 DIAGNOSIS — M25611 Stiffness of right shoulder, not elsewhere classified: Secondary | ICD-10-CM | POA: Insufficient documentation

## 2017-05-27 NOTE — Therapy (Signed)
[place], [place], [address] Phone: [phone]   Fax:  [phone]  Physical Therapy Treatment  Patient Details  Name: ANIYIAH ZELL MRN: [id number] Date of Birth: 1961-05-27 Referring Provider: Dr. Jana Hakim  Encounter Date: 05/27/2017      PT End of Session - 05/27/17 1107    Visit Number 6   Number of Visits 13   Date for PT Re-Evaluation 06/08/17   PT Start Time 1024   PT Stop Time 1107   PT Time Calculation (min) 43 min   Activity Tolerance Patient tolerated treatment well   Behavior During Therapy [facility] for tasks assessed/performed      Past Medical History:  Diagnosis Date  . Breast cancer ([place])   . Breast cancer of lower-inner quadrant of right female breast ([place]) 08/08/2015  . Colon polyps   . DDD (degenerative disc disease), cervical   . Degenerative joint disease   . Diabetes mellitus without complication ([place])   . Diverticulosis   . Gallstones   . GERD (gastroesophageal reflux disease)   . Headache(784.0)    migraine like per patient  . Hypertension   . Low back pain    dr phillips, pain management  . Osteoarthritis     Past Surgical History:  Procedure Laterality Date  . CHOLECYSTECTOMY  10/28/2011   Procedure: LAPAROSCOPIC CHOLECYSTECTOMY WITH INTRAOPERATIVE CHOLANGIOGRAM;  Surgeon: Earnstine Regal, MD;  [facility];  Service: General;  Laterality: N/A;  . COLONOSCOPY WITH ESOPHAGOGASTRODUODENOSCOPY (EGD)  02/03/2013   with Propofol  . EXAM UNDER ANESTHESIA WITH MANIPULATION OF KNEE Right 05/30/2003  . EXAM UNDER ANESTHESIA WITH MANIPULATION OF KNEE Left 12/27/2002  . GANGLION CYST EXCISION Right    right wrist  . KNEE ARTHROSCOPY Bilateral 01/02/2000  . KNEE ARTHROSCOPY Right   . LAPAROSCOPIC VAGINAL HYSTERECTOMY WITH SALPINGO OOPHORECTOMY Bilateral 07/13/2006  . LIPOMA EXCISION Right 02/21/2008   hip  . MASS EXCISION  08/24/2012   Procedure: EXCISION MASS;   Surgeon: Earnstine Regal, MD;  Location: [place];  Service: General;  Laterality: Left;  excisie soft tissue masses left shoulder(back) & left upper arm  . MASS EXCISION Left 01/24/2014   Procedure: EXCISION SOFT TISSUE MASSES LOWER LEFT BACK;  Surgeon: Earnstine Regal, MD;  Location: [place];  Service: General;  Laterality: Left;  Marland Kitchen MASS EXCISION Right 04/30/2016   Procedure: EXCISION OF SKIN RIGHT CHEST WALL;  Surgeon: Autumn Messing III, MD;  Location: [place];  Service: General;  Laterality: Right;  EXCISION OF SKIN RIGHT CHEST WALL  . MASTECTOMY W/ SENTINEL NODE BIOPSY Right 10/01/2015   Procedure: RIGHT MASTECTOMY WITH SENTINEL LYMPH NODE BIOPSY;  Surgeon: Autumn Messing III, MD;  Location: [place];  Service: General;  Laterality: Right;  . PORT-A-CATH REMOVAL Left 04/30/2016   Procedure: REMOVAL PORT-A-CATH;  Surgeon: Autumn Messing III, MD;  Location: [place];  Service: General;  Laterality: Left;  REMOVAL PORT-A-CATH  . PORTACATH PLACEMENT Left 11/01/2015   Procedure: INSERTION PORT-A-CATH;  Surgeon: Autumn Messing III, MD;  Location: [place];  Service: General;  Laterality: Left;  . REPLACEMENT UNICONDYLAR JOINT KNEE Left 04/20/2001  . REVISION TOTAL KNEE ARTHROPLASTY Right 06/18/2004  . REVISION TOTAL KNEE ARTHROPLASTY Left 11/15/2002  . REVISION TOTAL KNEE ARTHROPLASTY Left 10/12/2001  . REVISION TOTAL KNEE ARTHROPLASTY Right 07/09/2015  . TONSILLECTOMY    . TOTAL KNEE ARTHROPLASTY Right 04/25/2003  . TOTAL KNEE ARTHROPLASTY Left  06/25/2009    There were no vitals filed for this visit.      Subjective Assessment - 05/27/17 1025    Subjective My Rt shoulder is feeling pretty good today, it's my wrist that's really bothering me. I did call the doctor and they agreed it's just from the Anastrozole. I got measured for my compression sleeve and glove yesterday and [employer] paid for it! I was very happy about that.    Pertinent  History breast cancer with right  mastectomy with 20 lymph nodes removed in january 2017, chemo with peripheral neuropathy  and  had to have more skin removed before radiation radiation with skin disruption which was concluded last of October.  Past history includes multiple levels of degenerative disc disease in neck and spine and 4 knee surgeries on each knee with the last one in October 2016    Patient Stated Goals to get some range of motion in my right arm, tell me what I am doing wrong to cause this swelling in my chest    Currently in Pain? Yes   Pain Score 6    Pain Location Wrist   Pain Orientation Right   Pain Descriptors / Indicators Aching   Pain Type Acute pain   Pain Onset More than a month ago   Pain Frequency Intermittent   Aggravating Factors  Anastrozole   Pain Relieving Factors keep moving so I don't get stiff                         [facility] Adult PT Treatment/Exercise - 05/27/17 0001      Shoulder Exercises: Supine   Other Supine Exercises small controlled circles with arm pointing to ceiling and larger circles within painfree range.      Shoulder Exercises: Sidelying   External Rotation AROM;Right;10 reps   ABduction AROM;Right;10 reps     Shoulder Exercises: Pulleys   Flexion 2 minutes   ABduction 2 minutes     Shoulder Exercises: ROM/Strengthening   Other ROM/Strengthening Exercises UE Ranger for Rt shoulder flexion and then abduction 10 times each while seated in chair.     Shoulder Exercises: Isometric Strengthening   Flexion 5X5"  Rt UE in doorway   Extension 5X5"  Rt UE in doorway   External Rotation 5X5"  Rt UE in doorway   ABduction 5X5"  Rt UE in doorway     Manual Therapy   Manual Therapy Myofascial release;Manual Lymphatic Drainage (MLD);Passive ROM   Manual therapy comments Added compression extra chip foam to pack as pt reports it was starting to feel flat.   Myofascial Release Gently to Rt chest wall and scar tissue/incision  Also to tender trigger points in axilla and back area.    Manual Lymphatic Drainage (MLD) --   Passive ROM to right shoulder into flexion, abduction and external rotation as tolerated and with trigger point release throughout, mostly with flexion                        [facility] Goals - 05/11/17 1713      CC Long Term Goal  #1   Title Patient with verbalize an understanding of lymphedema risk reduction precautions   Time 4   Period Weeks   Status New   Target Date 06/08/17     CC Long Term Goal  #2   Title Patient will be independent in a home exercise program for continued  strengthening and stretching   Time 4   Period Weeks   Status New   Target Date 06/08/17     CC Long Term Goal  #3   Title Patient will improve left shoulder abduction to 130 degrees with no pain  so that she can perform dressing tasks indpendently    Baseline 92   Time 4   Period Weeks   Status New   Target Date 06/08/17     CC Long Term Goal  #4   Title Pt will demonstrate 130 degrees of right shoulder flexion to allow her to reach items overhead   Baseline 85   Time 4   Period Weeks   Status New   Target Date 06/08/17     CC Long Term Goal  #5   Title Pt will obtain appropriate compression bra for long term managment of lymphedema, and an appropriate compression sleeve for prophylactic use   Time 4   Period Weeks   Status New            Plan - 05/27/17 1108    Clinical Impression Statement Pt was measured yesterday for new compression sleeve and glove which should arrive in about a week. She had better pain free ROM today and was able to tolerate supine, S/L and isometrics with minimal pain today and with increased motion.    Rehab Potential Good   Clinical Impairments Affecting Rehab Potential 20 lymph nodes removed, previous radiation    PT Frequency 3x / week   PT Duration 4 weeks   PT Treatment/Interventions ADLs/Self Care Home Management;Patient/family  education;Manual techniques;Scar mobilization;Passive range of motion;Therapeutic activities;Therapeutic exercise;Manual lymph drainage;Taping;Vasopneumatic Device   PT Next Visit Plan Continue myofascial work to right upper quadrant and axilla, promoting AROM ( assess use of UE ranger) and MLD    Consulted and Agree with Plan of Care Patient      Patient will benefit from skilled therapeutic intervention in order to improve the following deficits and impairments:  Decreased knowledge of precautions, Decreased scar mobility, Decreased strength, Pain, Impaired UE functional use, Increased fascial restricitons, Postural dysfunction, Decreased range of motion, Increased edema  Visit Diagnosis: Postmastectomy lymphedema  Stiffness of right shoulder, not elsewhere classified  Acute pain of right shoulder  Muscle weakness (generalized)     Problem List Patient Active Problem List   Diagnosis Date Noted  . Insomnia 07/16/2016  . Left groin pain 11/30/2015  . Severe obesity (BMI >= 40) ([place]) 10/26/2015  . Malignant neoplasm of lower-inner quadrant of right breast of female, estrogen receptor positive ([place]) 08/08/2015  . Diabetes mellitus without complication ([place]) 19/50/9326  . Paraspinous mass, left 03/31/2013  . Internal hemorrhoid 02/17/2013  . Abdominal pain, other specified site 02/17/2013  . Neoplasm of soft tissue, left shoulder and left upper arm 08/03/2012  . Lightheadedness 01/29/2012  . Cholelithiasis with cholecystitis 10/08/2011  . Hypokalemia 09/17/2011  . GERD 08/08/2010  . GOUT 12/18/2008  . Essential hypertension 03/15/2007  . HEADACHE 03/15/2007    Otelia Limes, PTA 05/27/2017, 11:14 AM  [place], [place], [address] Phone: [phone]   Fax:  [phone]  Name: GERALDENE EISEL MRN: [id number] Date of Birth: 03-28-1961

## 2017-05-29 ENCOUNTER — Other Ambulatory Visit: Payer: Self-pay | Admitting: Family Medicine

## 2017-05-29 ENCOUNTER — Ambulatory Visit: Payer: 59 | Admitting: Physical Therapy

## 2017-05-29 DIAGNOSIS — I972 Postmastectomy lymphedema syndrome: Secondary | ICD-10-CM

## 2017-05-29 DIAGNOSIS — M25611 Stiffness of right shoulder, not elsewhere classified: Secondary | ICD-10-CM

## 2017-05-29 DIAGNOSIS — M6281 Muscle weakness (generalized): Secondary | ICD-10-CM

## 2017-05-29 DIAGNOSIS — M25511 Pain in right shoulder: Secondary | ICD-10-CM

## 2017-05-29 NOTE — Therapy (Signed)
[place], [place], [address] Phone: [phone]   Fax:  [phone]  Physical Therapy Treatment  Patient Details  Name: [name] MRN: [id number] Date of Birth: 25-Jan-1961 Referring Provider: Dr. Jana Hakim  Encounter Date: 05/29/2017      PT End of Session - 05/29/17 1158    Visit Number 7   Number of Visits 13   Date for PT Re-Evaluation 06/08/17   PT Start Time 1105   PT Stop Time 1145   PT Time Calculation (min) 40 min   Activity Tolerance Patient tolerated treatment well   Behavior During Therapy [facility] for tasks assessed/performed      Past Medical History:  Diagnosis Date  . Breast cancer ([place])   . Breast cancer of lower-inner quadrant of right female breast ([place]) 08/08/2015  . Colon polyps   . DDD (degenerative disc disease), cervical   . Degenerative joint disease   . Diabetes mellitus without complication ([place])   . Diverticulosis   . Gallstones   . GERD (gastroesophageal reflux disease)   . Headache(784.0)    migraine like per patient  . Hypertension   . Low back pain    dr phillips, pain management  . Osteoarthritis     Past Surgical History:  Procedure Laterality Date  . CHOLECYSTECTOMY  10/28/2011   Procedure: LAPAROSCOPIC CHOLECYSTECTOMY WITH INTRAOPERATIVE CHOLANGIOGRAM;  Surgeon: Earnstine Regal, MD;  [facility];  Service: General;  Laterality: N/A;  . COLONOSCOPY WITH ESOPHAGOGASTRODUODENOSCOPY (EGD)  02/03/2013   with Propofol  . EXAM UNDER ANESTHESIA WITH MANIPULATION OF KNEE Right 05/30/2003  . EXAM UNDER ANESTHESIA WITH MANIPULATION OF KNEE Left 12/27/2002  . GANGLION CYST EXCISION Right    right wrist  . KNEE ARTHROSCOPY Bilateral 01/02/2000  . KNEE ARTHROSCOPY Right   . LAPAROSCOPIC VAGINAL HYSTERECTOMY WITH SALPINGO OOPHORECTOMY Bilateral 07/13/2006  . LIPOMA EXCISION Right 02/21/2008   hip  . MASS EXCISION  08/24/2012   Procedure: EXCISION MASS;   Surgeon: Earnstine Regal, MD;  Location: [place];  Service: General;  Laterality: Left;  excisie soft tissue masses left shoulder(back) & left upper arm  . MASS EXCISION Left 01/24/2014   Procedure: EXCISION SOFT TISSUE MASSES LOWER LEFT BACK;  Surgeon: Earnstine Regal, MD;  Location: [place];  Service: General;  Laterality: Left;  Marland Kitchen MASS EXCISION Right 04/30/2016   Procedure: EXCISION OF SKIN RIGHT CHEST WALL;  Surgeon: Autumn Messing III, MD;  Location: [place];  Service: General;  Laterality: Right;  EXCISION OF SKIN RIGHT CHEST WALL  . MASTECTOMY W/ SENTINEL NODE BIOPSY Right 10/01/2015   Procedure: RIGHT MASTECTOMY WITH SENTINEL LYMPH NODE BIOPSY;  Surgeon: Autumn Messing III, MD;  Location: [place];  Service: General;  Laterality: Right;  . PORT-A-CATH REMOVAL Left 04/30/2016   Procedure: REMOVAL PORT-A-CATH;  Surgeon: Autumn Messing III, MD;  Location: [place];  Service: General;  Laterality: Left;  REMOVAL PORT-A-CATH  . PORTACATH PLACEMENT Left 11/01/2015   Procedure: INSERTION PORT-A-CATH;  Surgeon: Autumn Messing III, MD;  Location: [place];  Service: General;  Laterality: Left;  . REPLACEMENT UNICONDYLAR JOINT KNEE Left 04/20/2001  . REVISION TOTAL KNEE ARTHROPLASTY Right 06/18/2004  . REVISION TOTAL KNEE ARTHROPLASTY Left 11/15/2002  . REVISION TOTAL KNEE ARTHROPLASTY Left 10/12/2001  . REVISION TOTAL KNEE ARTHROPLASTY Right 07/09/2015  . TONSILLECTOMY    . TOTAL KNEE ARTHROPLASTY Right 04/25/2003  . TOTAL KNEE ARTHROPLASTY Left  06/25/2009    There were no vitals filed for this visit.      Subjective Assessment - 05/29/17 1108    Subjective Pt reports her arm feels a little stiff in the mornings,but she is working it out    Pertinent History breast cancer with right  mastectomy with 20 lymph nodes removed in january 2017, chemo with peripheral neuropathy  and  had to have more skin removed before radiation radiation with  skin disruption which was concluded last of October.  Past history includes multiple levels of degenerative disc disease in neck and spine and 4 knee surgeries on each knee with the last one in October 2016    Patient Stated Goals to get some range of motion in my right arm, tell me what I am doing wrong to cause this swelling in my chest    Currently in Pain? Yes   Pain Score 4    Pain Location Arm   Pain Orientation Right   Pain Descriptors / Indicators Aching  stiffness , i   Pain Radiating Towards beesting type of pain into chest wall                          [facility] Adult PT Treatment/Exercise - 05/29/17 0001      Lumbar Exercises: Supine   Other Supine Lumbar Exercises lower trunk rotation      Shoulder Exercises: Supine   Protraction PROM;Right;10 reps   Other Supine Exercises small controlled circles with arm pointing to ceiling and larger circles within painfree range.      Shoulder Exercises: Sidelying   Other Sidelying Exercises better able to do circles with hand pointed toward ceiling, but movement was uneven, appeared to be smoother after soft tissue work      Shoulder Exercises: Pulleys   Flexion 2 minutes   ABduction 2 minutes     Shoulder Exercises: ROM/Strengthening   Wall Wash with towel for flexion and abduction    Other ROM/Strengthening Exercises doorway stretch with arms in  Y position and also on top of door      Manual Therapy   Manual Therapy Soft tissue mobilization;Myofascial release;Manual Lymphatic Drainage (MLD);Passive ROM   Soft tissue mobilization with biotone to right posterior axilla and LD tendon and to Pec major tendonn    Myofascial Release Gently to Rt chest wall and scar tissue/incision Also to tender trigger points in axilla and back area.                         [facility] Goals - 05/11/17 1713      CC Long Term Goal  #1   Title Patient with verbalize an understanding of lymphedema risk reduction  precautions   Time 4   Period Weeks   Status New   Target Date 06/08/17     CC Long Term Goal  #2   Title Patient will be independent in a home exercise program for continued strengthening and stretching   Time 4   Period Weeks   Status New   Target Date 06/08/17     CC Long Term Goal  #3   Title Patient will improve left shoulder abduction to 130 degrees with no pain  so that she can perform dressing tasks indpendently    Baseline 92   Time 4   Period Weeks   Status New   Target Date 06/08/17  CC Long Term Goal  #4   Title Pt will demonstrate 130 degrees of right shoulder flexion to allow her to reach items overhead   Baseline 85   Time 4   Period Weeks   Status New   Target Date 06/08/17     CC Long Term Goal  #5   Title Pt will obtain appropriate compression bra for long term managment of lymphedema, and an appropriate compression sleeve for prophylactic use   Time 4   Period Weeks   Status New            Plan - 05/29/17 1159    Clinical Impression Statement Pt appeared to receive some relief with soft tissue work to posterior and anterior axilla today.  She continues to have tightness and pain with certain movements.  Pt felt good stretch with lower trunk rotation to left side and will continue that at home    Rehab Potential Good   Clinical Impairments Affecting Rehab Potential 20 lymph nodes removed, previous radiation    PT Frequency 3x / week   PT Duration 4 weeks   PT Treatment/Interventions ADLs/Self Care Home Management;Patient/family education;Manual techniques;Scar mobilization;Passive range of motion;Therapeutic activities;Therapeutic exercise;Manual lymph drainage;Taping;Vasopneumatic Device   PT Next Visit Plan Continue myofascial work and soft tissue work  to right upper quadrant and axilla, promoting AROM ( assess use of UE ranger) and MLD    Consulted and Agree with Plan of Care Patient      Patient will benefit from skilled therapeutic  intervention in order to improve the following deficits and impairments:  Decreased knowledge of precautions, Decreased scar mobility, Decreased strength, Pain, Impaired UE functional use, Increased fascial restricitons, Postural dysfunction, Decreased range of motion, Increased edema  Visit Diagnosis: Postmastectomy lymphedema  Stiffness of right shoulder, not elsewhere classified  Acute pain of right shoulder  Muscle weakness (generalized)     Problem List Patient Active Problem List   Diagnosis Date Noted  . Insomnia 07/16/2016  . Left groin pain 11/30/2015  . Severe obesity (BMI >= 40) ([place]) 10/26/2015  . Malignant neoplasm of lower-inner quadrant of right breast of female, estrogen receptor positive ([place]) 08/08/2015  . Diabetes mellitus without complication ([place]) 91/47/8295  . Paraspinous mass, left 03/31/2013  . Internal hemorrhoid 02/17/2013  . Abdominal pain, other specified site 02/17/2013  . Neoplasm of soft tissue, left shoulder and left upper arm 08/03/2012  . Lightheadedness 01/29/2012  . Cholelithiasis with cholecystitis 10/08/2011  . Hypokalemia 09/17/2011  . GERD 08/08/2010  . GOUT 12/18/2008  . Essential hypertension 03/15/2007  . HEADACHE 03/15/2007   Donato Heinz. Owens Shark PT  Norwood Levo 05/29/2017, 12:02 PM  [place], [place], [address] Phone: [phone]   Fax:  [phone]  Name: [name] MRN: [id number] Date of Birth: 01-Jan-1961

## 2017-06-01 ENCOUNTER — Ambulatory Visit: Payer: 59 | Admitting: Physical Therapy

## 2017-06-01 ENCOUNTER — Encounter: Payer: Self-pay | Admitting: Physical Therapy

## 2017-06-01 DIAGNOSIS — Z79891 Long term (current) use of opiate analgesic: Secondary | ICD-10-CM | POA: Diagnosis not present

## 2017-06-01 DIAGNOSIS — I972 Postmastectomy lymphedema syndrome: Secondary | ICD-10-CM | POA: Diagnosis not present

## 2017-06-01 DIAGNOSIS — M150 Primary generalized (osteo)arthritis: Secondary | ICD-10-CM | POA: Diagnosis not present

## 2017-06-01 DIAGNOSIS — G894 Chronic pain syndrome: Secondary | ICD-10-CM | POA: Diagnosis not present

## 2017-06-01 DIAGNOSIS — C50511 Malignant neoplasm of lower-outer quadrant of right female breast: Secondary | ICD-10-CM | POA: Diagnosis not present

## 2017-06-01 DIAGNOSIS — M25611 Stiffness of right shoulder, not elsewhere classified: Secondary | ICD-10-CM

## 2017-06-01 DIAGNOSIS — M6281 Muscle weakness (generalized): Secondary | ICD-10-CM

## 2017-06-01 DIAGNOSIS — M25511 Pain in right shoulder: Secondary | ICD-10-CM

## 2017-06-01 NOTE — Therapy (Signed)
[place], [place], [address] Phone: [phone]   Fax:  [phone]  Physical Therapy Treatment  Patient Details  Name: [name] MRN: [id number] Date of Birth: [birth year] Referring Provider: Dr. Jana Hakim  Encounter Date: 06/01/2017      PT End of Session - 06/01/17 1434    Visit Number 8   Number of Visits 13   Date for PT Re-Evaluation 06/08/17   PT Start Time 1350   PT Stop Time 1434   PT Time Calculation (min) 44 min   Activity Tolerance Patient tolerated treatment well   Behavior During Therapy [facility] for tasks assessed/performed      Past Medical History:  Diagnosis Date  . Breast cancer ([place])   . Breast cancer of lower-inner quadrant of right female breast ([place]) 08/08/2015  . Colon polyps   . DDD (degenerative disc disease), cervical   . Degenerative joint disease   . Diabetes mellitus without complication ([place])   . Diverticulosis   . Gallstones   . GERD (gastroesophageal reflux disease)   . Headache(784.0)    migraine like per patient  . Hypertension   . Low back pain    dr phillips, pain management  . Osteoarthritis     Past Surgical History:  Procedure Laterality Date  . CHOLECYSTECTOMY  10/28/2011   Procedure: LAPAROSCOPIC CHOLECYSTECTOMY WITH INTRAOPERATIVE CHOLANGIOGRAM;  Surgeon: Earnstine Regal, MD;  [facility];  Service: General;  Laterality: N/A;  . COLONOSCOPY WITH ESOPHAGOGASTRODUODENOSCOPY (EGD)  02/03/2013   with Propofol  . EXAM UNDER ANESTHESIA WITH MANIPULATION OF KNEE Right 05/30/2003  . EXAM UNDER ANESTHESIA WITH MANIPULATION OF KNEE Left 12/27/2002  . GANGLION CYST EXCISION Right    right wrist  . KNEE ARTHROSCOPY Bilateral 01/02/2000  . KNEE ARTHROSCOPY Right   . LAPAROSCOPIC VAGINAL HYSTERECTOMY WITH SALPINGO OOPHORECTOMY Bilateral 07/13/2006  . LIPOMA EXCISION Right 02/21/2008   hip  . MASS EXCISION  08/24/2012   Procedure: EXCISION MASS;   Surgeon: Earnstine Regal, MD;  Location: [place];  Service: General;  Laterality: Left;  excisie soft tissue masses left shoulder(back) & left upper arm  . MASS EXCISION Left 01/24/2014   Procedure: EXCISION SOFT TISSUE MASSES LOWER LEFT BACK;  Surgeon: Earnstine Regal, MD;  Location: [place];  Service: General;  Laterality: Left;  Marland Kitchen MASS EXCISION Right 04/30/2016   Procedure: EXCISION OF SKIN RIGHT CHEST WALL;  Surgeon: Autumn Messing III, MD;  Location: [place];  Service: General;  Laterality: Right;  EXCISION OF SKIN RIGHT CHEST WALL  . MASTECTOMY W/ SENTINEL NODE BIOPSY Right 10/01/2015   Procedure: RIGHT MASTECTOMY WITH SENTINEL LYMPH NODE BIOPSY;  Surgeon: Autumn Messing III, MD;  Location: [place];  Service: General;  Laterality: Right;  . PORT-A-CATH REMOVAL Left 04/30/2016   Procedure: REMOVAL PORT-A-CATH;  Surgeon: Autumn Messing III, MD;  Location: [place];  Service: General;  Laterality: Left;  REMOVAL PORT-A-CATH  . PORTACATH PLACEMENT Left 11/01/2015   Procedure: INSERTION PORT-A-CATH;  Surgeon: Autumn Messing III, MD;  Location: [place];  Service: General;  Laterality: Left;  . REPLACEMENT UNICONDYLAR JOINT KNEE Left 04/20/2001  . REVISION TOTAL KNEE ARTHROPLASTY Right 06/18/2004  . REVISION TOTAL KNEE ARTHROPLASTY Left 11/15/2002  . REVISION TOTAL KNEE ARTHROPLASTY Left 10/12/2001  . REVISION TOTAL KNEE ARTHROPLASTY Right 07/09/2015  . TONSILLECTOMY    . TOTAL KNEE ARTHROPLASTY Right 04/25/2003  . TOTAL KNEE ARTHROPLASTY Left  06/25/2009    There were no vitals filed for this visit.      Subjective Assessment - 06/01/17 1352    Subjective My arm is feeling good. I told my husband I was ready for therapy because it was getting stiff. I am having a good amount of swelling in my fingers during the day.    Pertinent History breast cancer with right  mastectomy with 20 lymph nodes removed in january 2017, chemo with peripheral  neuropathy  and  had to have more skin removed before radiation radiation with skin disruption which was concluded last of October.  Past history includes multiple levels of degenerative disc disease in neck and spine and 4 knee surgeries on each knee with the last one in October 2016    Patient Stated Goals to get some range of motion in my right arm, tell me what I am doing wrong to cause this swelling in my chest    Currently in Pain? Yes   Pain Score 3    Pain Location Arm   Pain Orientation Right   Pain Descriptors / Indicators Aching;Hervey Ard            [facility] PT Assessment - 06/01/17 0001      AROM   Right Shoulder Flexion 155 Degrees   Right Shoulder ABduction 122 Degrees                     [facility] Adult PT Treatment/Exercise - 06/01/17 0001      Shoulder Exercises: P[place]   Flexion 2 minutes   ABduction 2 minutes     Shoulder Exercises: ROM/Strengthening   Wall Wash with towel for flexion and abduction      Manual Therapy   Myofascial Release Gently to Rt chest wall and scar tissue/incision Also to tender trigger points in axilla   Passive ROM to right shoulder into flexion, abduction and external rotation as tolerated and with trigger point release throughout, mostly with flexion, full PROM gained in session                        [facility] Goals - 06/01/17 1432      CC Long Term Goal  #1   Title Patient with verbalize an understanding of lymphedema risk reduction precautions   Time 4   Period Weeks   Status On-going     CC Long Term Goal  #2   Title Patient will be independent in a home exercise program for continued strengthening and stretching   Time 4   Period Weeks   Status On-going     CC Long Term Goal  #3   Title Patient will improve left shoulder abduction to 130 degrees with no pain  so that she can perform dressing tasks indpendently    Baseline 92, 06/01/17- 122 degrees   Time 4   Period Weeks   Status On-going      CC Long Term Goal  #4   Title Pt will demonstrate 130 degrees of right shoulder flexion to allow her to reach items overhead   Baseline 85, 06/01/17- 155 degrees   Time 4   Period Weeks   Status Achieved     CC Long Term Goal  #5   Title Pt will obtain appropriate compression bra for long term managment of lymphedema, and an appropriate compression sleeve for prophylactic use   Baseline 06/01/17- pt is getting measured for a compression bra on  06/10/17, sleeve ordered last week   Time 4   Period Weeks   Status On-going            Plan - 06/01/17 1435    Clinical Impression Statement Continued with AAROM exercises today and PROM with soft tissue work and myofascial release. Pt gained full PROM by end of session today. AROM measurements taken following PROM today and pt has met her flexion ROM goal and has progressed towards her abduction ROM goal.    Rehab Potential Good   Clinical Impairments Affecting Rehab Potential 20 lymph nodes removed, previous radiation    PT Frequency 3x / week   PT Duration 4 weeks   PT Treatment/Interventions ADLs/Self Care Home Management;Patient/family education;Manual techniques;Scar mobilization;Passive range of motion;Therapeutic activities;Therapeutic exercise;Manual lymph drainage;Taping;Vasopneumatic Device   PT Next Visit Plan Continue myofascial work and soft tissue work  to right upper quadrant and axilla, promoting AROM ( assess use of UE ranger) and MLD    Consulted and Agree with Plan of Care Patient      Patient will benefit from skilled therapeutic intervention in order to improve the following deficits and impairments:  Decreased knowledge of precautions, Decreased scar mobility, Decreased strength, Pain, Impaired UE functional use, Increased fascial restricitons, Postural dysfunction, Decreased range of motion, Increased edema  Visit Diagnosis: Stiffness of right shoulder, not elsewhere classified  Acute pain of right  shoulder  Muscle weakness (generalized)     Problem List Patient Active Problem List   Diagnosis Date Noted  . Insomnia 07/16/2016  . Left groin pain 11/30/2015  . Severe obesity (BMI >= 40) ([place]) 10/26/2015  . Malignant neoplasm of lower-inner quadrant of right breast of female, estrogen receptor positive ([place]) 08/08/2015  . Diabetes mellitus without complication ([place]) 11/14/3610  . Paraspinous mass, left 03/31/2013  . Internal hemorrhoid 02/17/2013  . Abdominal pain, other specified site 02/17/2013  . Neoplasm of soft tissue, left shoulder and left upper arm 08/03/2012  . Lightheadedness 01/29/2012  . Cholelithiasis with cholecystitis 10/08/2011  . Hypokalemia 09/17/2011  . GERD 08/08/2010  . GOUT 12/18/2008  . Essential hypertension 03/15/2007  . HEADACHE 03/15/2007    Allyson Sabal [facility] 06/01/2017, 2:37 PM  [place], [place], [address] Phone: [phone]   Fax:  [phone]  Name: [name] MRN: [id number] Date of Birth: Jul 11, 1961  Manus Gunning, PT 06/01/17 2:37 PM

## 2017-06-01 NOTE — Telephone Encounter (Signed)
Can we refill? Looks like pt needs A1c.

## 2017-06-03 ENCOUNTER — Ambulatory Visit: Payer: 59 | Admitting: Physical Therapy

## 2017-06-03 ENCOUNTER — Encounter: Payer: Self-pay | Admitting: Physical Therapy

## 2017-06-03 DIAGNOSIS — I972 Postmastectomy lymphedema syndrome: Secondary | ICD-10-CM

## 2017-06-03 DIAGNOSIS — M25611 Stiffness of right shoulder, not elsewhere classified: Secondary | ICD-10-CM

## 2017-06-03 DIAGNOSIS — M6281 Muscle weakness (generalized): Secondary | ICD-10-CM

## 2017-06-03 DIAGNOSIS — M25511 Pain in right shoulder: Secondary | ICD-10-CM

## 2017-06-03 NOTE — Therapy (Signed)
[place], [place], [address] Phone: [phone]   Fax:  [phone]  Physical Therapy Treatment  Patient Details  Name: [name] MRN: [id number] Date of Birth: [birth year] Referring Provider: Dr. Jana Hakim  Encounter Date: 06/03/2017      PT End of Session - 06/03/17 1614    Visit Number 9   Number of Visits 13   Date for PT Re-Evaluation 06/08/17   PT Start Time 1520   PT Stop Time 1605   PT Time Calculation (min) 45 min   Activity Tolerance Patient tolerated treatment well   Behavior During Therapy [facility] for tasks assessed/performed      Past Medical History:  Diagnosis Date  . Breast cancer ([place])   . Breast cancer of lower-inner quadrant of right female breast ([place]) 08/08/2015  . Colon polyps   . DDD (degenerative disc disease), cervical   . Degenerative joint disease   . Diabetes mellitus without complication ([place])   . Diverticulosis   . Gallstones   . GERD (gastroesophageal reflux disease)   . Headache(784.0)    migraine like per patient  . Hypertension   . Low back pain    dr phillips, pain management  . Osteoarthritis     Past Surgical History:  Procedure Laterality Date  . CHOLECYSTECTOMY  10/28/2011   Procedure: LAPAROSCOPIC CHOLECYSTECTOMY WITH INTRAOPERATIVE CHOLANGIOGRAM;  Surgeon: Earnstine Regal, MD;  [facility];  Service: General;  Laterality: N/A;  . COLONOSCOPY WITH ESOPHAGOGASTRODUODENOSCOPY (EGD)  02/03/2013   with Propofol  . EXAM UNDER ANESTHESIA WITH MANIPULATION OF KNEE Right 05/30/2003  . EXAM UNDER ANESTHESIA WITH MANIPULATION OF KNEE Left 12/27/2002  . GANGLION CYST EXCISION Right    right wrist  . KNEE ARTHROSCOPY Bilateral 01/02/2000  . KNEE ARTHROSCOPY Right   . LAPAROSCOPIC VAGINAL HYSTERECTOMY WITH SALPINGO OOPHORECTOMY Bilateral 07/13/2006  . LIPOMA EXCISION Right 02/21/2008   hip  . MASS EXCISION  08/24/2012   Procedure: EXCISION MASS;   Surgeon: Earnstine Regal, MD;  Location: [place];  Service: General;  Laterality: Left;  excisie soft tissue masses left shoulder(back) & left upper arm  . MASS EXCISION Left 01/24/2014   Procedure: EXCISION SOFT TISSUE MASSES LOWER LEFT BACK;  Surgeon: Earnstine Regal, MD;  Location: [place];  Service: General;  Laterality: Left;  Marland Kitchen MASS EXCISION Right 04/30/2016   Procedure: EXCISION OF SKIN RIGHT CHEST WALL;  Surgeon: Autumn Messing III, MD;  Location: [place];  Service: General;  Laterality: Right;  EXCISION OF SKIN RIGHT CHEST WALL  . MASTECTOMY W/ SENTINEL NODE BIOPSY Right 10/01/2015   Procedure: RIGHT MASTECTOMY WITH SENTINEL LYMPH NODE BIOPSY;  Surgeon: Autumn Messing III, MD;  Location: [place];  Service: General;  Laterality: Right;  . PORT-A-CATH REMOVAL Left 04/30/2016   Procedure: REMOVAL PORT-A-CATH;  Surgeon: Autumn Messing III, MD;  Location: [place];  Service: General;  Laterality: Left;  REMOVAL PORT-A-CATH  . PORTACATH PLACEMENT Left 11/01/2015   Procedure: INSERTION PORT-A-CATH;  Surgeon: Autumn Messing III, MD;  Location: [place];  Service: General;  Laterality: Left;  . REPLACEMENT UNICONDYLAR JOINT KNEE Left 04/20/2001  . REVISION TOTAL KNEE ARTHROPLASTY Right 06/18/2004  . REVISION TOTAL KNEE ARTHROPLASTY Left 11/15/2002  . REVISION TOTAL KNEE ARTHROPLASTY Left 10/12/2001  . REVISION TOTAL KNEE ARTHROPLASTY Right 07/09/2015  . TONSILLECTOMY    . TOTAL KNEE ARTHROPLASTY Right 04/25/2003  . TOTAL KNEE ARTHROPLASTY Left  06/25/2009    There were no vitals filed for this visit.      Subjective Assessment - 06/03/17 1523    Subjective I am not having a great day today. I am having back pain and neck pain. It is hard to turn my neck. The pain started today when I got up this morning. The back pain started yesterday when I was doing some house work.    Pertinent History breast cancer with right  mastectomy with 20 lymph  nodes removed in january 2017, chemo with peripheral neuropathy  and  had to have more skin removed before radiation radiation with skin disruption which was concluded last of October.  Past history includes multiple levels of degenerative disc disease in neck and spine and 4 knee surgeries on each knee with the last one in October 2016    Patient Stated Goals to get some range of motion in my right arm, tell me what I am doing wrong to cause this swelling in my chest    Currently in Pain? Yes   Pain Score 6    Pain Location Back  and right neck   Pain Orientation Lower   Pain Descriptors / Indicators Sharp   Pain Type Acute pain                         [facility] Adult PT Treatment/Exercise - 06/03/17 0001      Manual Therapy   Myofascial Release Gently to Rt chest wall and scar tissue/incision Also to tender trigger points in axilla   Manual Lymphatic Drainage (MLD) In Supine: Short neck, superficial and deep abdominals, Rt inguinal and Lt axillary nodes, Rt axillo-inguinal and anterior inter-axillary anstomosis then Rt UE from dorsal hand to lateral shoulder working from proximal to distal and retracing all steps, also focusing at Rt lateral trunk inferior to axilla where pt c/o most pain/swelling   Passive ROM to right shoulder into flexion, abduction as tolerated, stopped with flexion due to increased pain in shoulder that pt feels is related to "overdoing it" with [place] - 06/01/17 1432      CC Long Term Goal  #1   Title Patient with verbalize an understanding of lymphedema risk reduction precautions   Time 4   Period Weeks   Status On-going     CC Long Term Goal  #2   Title Patient will be independent in a home exercise program for continued strengthening and stretching   Time 4   Period Weeks   Status On-going     CC Long Term Goal  #3   Title Patient will improve left shoulder abduction to 130  degrees with no pain  so that she can perform dressing tasks indpendently    Baseline 92, 06/01/17- 122 degrees   Time 4   Period Weeks   Status On-going     CC Long Term Goal  #4   Title Pt will demonstrate 130 degrees of right shoulder flexion to allow her to reach items overhead   Baseline 85, 06/01/17- 155 degrees   Time 4   Period Weeks   Status Achieved     CC Long Term Goal  #5   Title Pt will obtain appropriate compression bra for long term managment of lymphedema, and an appropriate compression sleeve for prophylactic  use   Baseline 06/01/17- pt is getting measured for a compression bra on 06/10/17, sleeve ordered last week   Time 4   Period Weeks   Status On-going            Plan - 06/03/17 1615    Clinical Impression Statement Pt still feels fullness in her right trunk and fingers so did MLD to this area today. Continued with PROM to right shoulder and myofascial to areas of tightness in right chest. Pt had more pain today with flexion in her upper shoulder. Attempted gentle pulling with flexion to decrease pain but pt still had pain. She states she think she over did it with mopping the other day. She is being fit for her compression bra on Monday.    Clinical Impairments Affecting Rehab Potential 20 lymph nodes removed, previous radiation    PT Frequency 3x / week   PT Duration 4 weeks   PT Treatment/Interventions ADLs/Self Care Home Management;Patient/family education;Manual techniques;Scar mobilization;Passive range of motion;Therapeutic activities;Therapeutic exercise;Manual lymph drainage;Taping;Vasopneumatic Device   PT Next Visit Plan Continue myofascial work and soft tissue work  to right upper quadrant and axilla, promoting AROM ( assess use of UE ranger) and MLD       Patient will benefit from skilled therapeutic intervention in order to improve the following deficits and impairments:  Decreased knowledge of precautions, Decreased scar mobility, Decreased  strength, Pain, Impaired UE functional use, Increased fascial restricitons, Postural dysfunction, Decreased range of motion, Increased edema  Visit Diagnosis: Stiffness of right shoulder, not elsewhere classified  Acute pain of right shoulder  Muscle weakness (generalized)  Postmastectomy lymphedema     Problem List Patient Active Problem List   Diagnosis Date Noted  . Insomnia 07/16/2016  . Left groin pain 11/30/2015  . Severe obesity (BMI >= 40) ([place]) 10/26/2015  . Malignant neoplasm of lower-inner quadrant of right breast of female, estrogen receptor positive ([place]) 08/08/2015  . Diabetes mellitus without complication ([place]) 17/40/8144  . Paraspinous mass, left 03/31/2013  . Internal hemorrhoid 02/17/2013  . Abdominal pain, other specified site 02/17/2013  . Neoplasm of soft tissue, left shoulder and left upper arm 08/03/2012  . Lightheadedness 01/29/2012  . Cholelithiasis with cholecystitis 10/08/2011  . Hypokalemia 09/17/2011  . GERD 08/08/2010  . GOUT 12/18/2008  . Essential hypertension 03/15/2007  . HEADACHE 03/15/2007    Allyson Sabal [facility] 06/03/2017, 4:22 PM  [place], [place], [address] Phone: [phone]   Fax:  [phone]  Name: [name] MRN: [id number] Date of Birth: 02/20/1961  Manus Gunning, PT 06/03/17 4:22 PM

## 2017-06-04 ENCOUNTER — Other Ambulatory Visit: Payer: Self-pay | Admitting: Family Medicine

## 2017-06-05 ENCOUNTER — Ambulatory Visit: Payer: 59 | Admitting: Physical Therapy

## 2017-06-05 ENCOUNTER — Encounter: Payer: Self-pay | Admitting: Physical Therapy

## 2017-06-05 DIAGNOSIS — R293 Abnormal posture: Secondary | ICD-10-CM

## 2017-06-05 DIAGNOSIS — I972 Postmastectomy lymphedema syndrome: Secondary | ICD-10-CM | POA: Diagnosis not present

## 2017-06-05 DIAGNOSIS — M25612 Stiffness of left shoulder, not elsewhere classified: Secondary | ICD-10-CM

## 2017-06-05 DIAGNOSIS — M79621 Pain in right upper arm: Secondary | ICD-10-CM

## 2017-06-05 DIAGNOSIS — M25511 Pain in right shoulder: Secondary | ICD-10-CM

## 2017-06-05 DIAGNOSIS — M6281 Muscle weakness (generalized): Secondary | ICD-10-CM

## 2017-06-05 DIAGNOSIS — M25611 Stiffness of right shoulder, not elsewhere classified: Secondary | ICD-10-CM

## 2017-06-05 NOTE — Therapy (Signed)
[place], [place], [address] Phone: [phone]   Fax:  [phone]  Physical Therapy Treatment  Patient Details  [name]: [name] MRN: [id number] Date of Birth: 1961/07/31 Referring Provider: Dr. Jana Hakim  Encounter Date: 06/05/2017      PT End of Session - 06/05/17 0929    Visit Number 10   Number of Visits 13   Date for PT Re-Evaluation 06/08/17   PT Start Time 0847   PT Stop Time 0930   PT Time Calculation (min) 43 min   Activity Tolerance Patient tolerated treatment well   Behavior During Therapy [facility] for tasks assessed/performed      Past Medical History:  Diagnosis Date  . Breast cancer ([place])   . Breast cancer of lower-inner quadrant of right female breast ([place]) 08/08/2015  . Colon polyps   . DDD (degenerative disc disease), cervical   . Degenerative joint disease   . Diabetes mellitus without complication ([place])   . Diverticulosis   . Gallstones   . GERD (gastroesophageal reflux disease)   . Headache(784.0)    migraine like per patient  . Hypertension   . Low back pain    dr phillips, pain management  . Osteoarthritis     Past Surgical History:  Procedure Laterality Date  . CHOLECYSTECTOMY  10/28/2011   Procedure: LAPAROSCOPIC CHOLECYSTECTOMY WITH INTRAOPERATIVE CHOLANGIOGRAM;  Surgeon: Earnstine Regal, MD;  [facility];  Service: General;  Laterality: N/A;  . COLONOSCOPY WITH ESOPHAGOGASTRODUODENOSCOPY (EGD)  02/03/2013   with Propofol  . EXAM UNDER ANESTHESIA WITH MANIPULATION OF KNEE Right 05/30/2003  . EXAM UNDER ANESTHESIA WITH MANIPULATION OF KNEE Left 12/27/2002  . GANGLION CYST EXCISION Right    right wrist  . KNEE ARTHROSCOPY Bilateral 01/02/2000  . KNEE ARTHROSCOPY Right   . LAPAROSCOPIC VAGINAL HYSTERECTOMY WITH SALPINGO OOPHORECTOMY Bilateral 07/13/2006  . LIPOMA EXCISION Right 02/21/2008   hip  . MASS EXCISION  08/24/2012   Procedure: EXCISION MASS;   Surgeon: Earnstine Regal, MD;  Location: [place];  Service: General;  Laterality: Left;  excisie soft tissue masses left shoulder(back) & left upper arm  . MASS EXCISION Left 01/24/2014   Procedure: EXCISION SOFT TISSUE MASSES LOWER LEFT BACK;  Surgeon: Earnstine Regal, MD;  Location: [place];  Service: General;  Laterality: Left;  Marland Kitchen MASS EXCISION Right 04/30/2016   Procedure: EXCISION OF SKIN RIGHT CHEST WALL;  Surgeon: Autumn Messing III, MD;  Location: [place];  Service: General;  Laterality: Right;  EXCISION OF SKIN RIGHT CHEST WALL  . MASTECTOMY W/ SENTINEL NODE BIOPSY Right 10/01/2015   Procedure: RIGHT MASTECTOMY WITH SENTINEL LYMPH NODE BIOPSY;  Surgeon: Autumn Messing III, MD;  Location: [place];  Service: General;  Laterality: Right;  . PORT-A-CATH REMOVAL Left 04/30/2016   Procedure: REMOVAL PORT-A-CATH;  Surgeon: Autumn Messing III, MD;  Location: [place];  Service: General;  Laterality: Left;  REMOVAL PORT-A-CATH  . PORTACATH PLACEMENT Left 11/01/2015   Procedure: INSERTION PORT-A-CATH;  Surgeon: Autumn Messing III, MD;  Location: [place];  Service: General;  Laterality: Left;  . REPLACEMENT UNICONDYLAR JOINT KNEE Left 04/20/2001  . REVISION TOTAL KNEE ARTHROPLASTY Right 06/18/2004  . REVISION TOTAL KNEE ARTHROPLASTY Left 11/15/2002  . REVISION TOTAL KNEE ARTHROPLASTY Left 10/12/2001  . REVISION TOTAL KNEE ARTHROPLASTY Right 07/09/2015  . TONSILLECTOMY    . TOTAL KNEE ARTHROPLASTY Right 04/25/2003  . TOTAL KNEE ARTHROPLASTY Left  06/25/2009    There were no vitals filed for this visit.      Subjective Assessment - 06/05/17 0848    Subjective I got better after last session. I felt good.    Pertinent History breast cancer with right  mastectomy with 20 lymph nodes removed in january 2017, chemo with peripheral neuropathy  and  had to have more skin removed before radiation radiation with skin disruption which was concluded  last of October.  Past history includes multiple levels of degenerative disc disease in neck and spine and 4 knee surgeries on each knee with the last one in October 2016    Patient Stated Goals to get some range of motion in my right arm, tell me what I am doing wrong to cause this swelling in my chest    Currently in Pain? No/denies   Pain Score 0-No pain                  [name] - 06/05/17 0001    Open a tight or new jar Moderate difficulty   Do heavy household chores (wash walls, wash floors) Mild difficulty   Carry a shopping bag or briefcase Mild difficulty   Wash your back Moderate difficulty   Use a knife to cut food No difficulty   Recreational activities in which you take some force or impact through your arm, shoulder, or hand (golf, hammering, tennis) Mild difficulty   During the past week, to what extent has your arm, shoulder or hand problem interfered with your normal social activities with family, friends, neighbors, or groups? Slightly   During the past week, to what extent has your arm, shoulder or hand problem limited your work or other regular daily activities Slightly   Arm, shoulder, or hand pain. Mild   Tingling (pins and needles) in your arm, shoulder, or hand Mild   Difficulty Sleeping Mild difficulty   DASH Score 27.27 %               [facility] Adult PT Treatment/Exercise - 06/05/17 0001      Shoulder Exercises: Supine   Horizontal ABduction Strengthening;Both;10 reps;Theraband   Theraband Level (Shoulder Horizontal ABduction) Level 1 (Yellow)   External Rotation Strengthening;Both;10 reps;Theraband   Theraband Level (Shoulder External Rotation) Level 1 (Yellow)   Flexion Strengthening;Both;10 reps;Theraband  narrow and wide grip   Theraband Level (Shoulder Flexion) Level 1 (Yellow)   Other Supine Exercises D2 x 10 reps bilaterally with yellow band     Shoulder Exercises: Therapy Ball   Flexion 10 reps  with stretch at end range    ABduction 10 reps  with stretch at end range on right     Manual Therapy   Myofascial Release gently to R pec in areas of tightness   Passive ROM with gentle pulling into right shoulder abduction and flexion with no complaints of pain today                        Long Term Clinic Goals - 06/01/17 1432      CC Long Term Goal  #1   Title Patient with verbalize an understanding of lymphedema risk reduction precautions   Time 4   Period Weeks   Status On-going     CC Long Term Goal  #2   Title Patient will be independent in a home exercise program for continued strengthening and stretching   Time 4   Period Weeks   Status  On-going     CC Long Term Goal  #3   Title Patient will improve left shoulder abduction to 130 degrees with no pain  so that she can perform dressing tasks indpendently    Baseline 92, 06/01/17- 122 degrees   Time 4   Period Weeks   Status On-going     CC Long Term Goal  #4   Title Pt will demonstrate 130 degrees of right shoulder flexion to allow her to reach items overhead   Baseline 85, 06/01/17- 155 degrees   Time 4   Period Weeks   Status Achieved     CC Long Term Goal  #5   Title Pt will obtain appropriate compression bra for long term managment of lymphedema, and an appropriate compression sleeve for prophylactic use   Baseline 06/01/17- pt is getting measured for a compression bra on 06/10/17, sleeve ordered last week   Time 4   Period Weeks   Status On-going            Plan - 2017-06-25 0859    Clinical Impression Statement Added supine scapular strengthening exericses today to pt's home exercise program. Also added abduction with the therapy ball up the wall and pt felt a good stretch with this. Continued with gentle pulling into abduction and flexion for right shoulder ROM. Pt's quick dash score has improved greatly since evaluation with pt initially scoring 63% impairment and improving to 27% impairment today.    Rehab Potential  Good   Clinical Impairments Affecting Rehab Potential 20 lymph nodes removed, previous radiation    PT Frequency 3x / week   PT Duration 4 weeks   PT Treatment/Interventions ADLs/Self Care Home Management;Patient/family education;Manual techniques;Scar mobilization;Passive range of motion;Therapeutic activities;Therapeutic exercise;Manual lymph drainage;Taping;Vasopneumatic Device   PT Next Visit Plan Assess independence with supine scapular exercises, Continue myofascial work and soft tissue work  to right upper quadrant and axilla, promoting AROM ( assess use of UE ranger) and MLD    Consulted and Agree with Plan of Care Patient      Patient will benefit from skilled therapeutic intervention in order to improve the following deficits and impairments:  Decreased knowledge of precautions, Decreased scar mobility, Decreased strength, Pain, Impaired UE functional use, Increased fascial restricitons, Postural dysfunction, Decreased range of motion, Increased edema  Visit Diagnosis: Stiffness of right shoulder, not elsewhere classified  Acute pain of right shoulder  Muscle weakness (generalized)  Stiffness of left shoulder, not elsewhere classified  Stiffness of right shoulder joint  Abnormal posture  Pain in right upper arm       G-Codes - 25-Jun-2017 0914    Functional Assessment Tool Used (Outpatient Only) quick dash   Functional Limitation Carrying, moving and handling objects   Carrying, Moving and Handling Objects Current Status (Y6063) At least 20 percent but less than 40 percent impaired, limited or restricted   Carrying, Moving and Handling Objects Goal Status (K1601) At least 1 percent but less than 20 percent impaired, limited or restricted      Problem List Patient Active Problem List   Diagnosis Date Noted  . Insomnia 07/16/2016  . Left groin pain 11/30/2015  . Severe obesity (BMI >= 40) ([place]) 10/26/2015  . Malignant neoplasm of lower-inner quadrant of right breast  of female, estrogen receptor positive ([place]) 08/08/2015  . Diabetes mellitus without complication ([place]) 09/32/3557  . Paraspinous mass, left 03/31/2013  . Internal hemorrhoid 02/17/2013  . Abdominal pain, other specified site 02/17/2013  . Neoplasm of soft  tissue, left shoulder and left upper arm 08/03/2012  . Lightheadedness 01/29/2012  . Cholelithiasis with cholecystitis 10/08/2011  . Hypokalemia 09/17/2011  . GERD 08/08/2010  . GOUT 12/18/2008  . Essential hypertension 03/15/2007  . HEADACHE 03/15/2007    Allyson Sabal [facility] 06/05/2017, 9:35 AM  [place], [place], [address] Phone: [phone]   Fax:  [phone]  [name]: DONNITA FARINA MRN: [id number] Date of Birth: [birth year]  Manus Gunning, PT 06/05/17 9:35 AM

## 2017-06-05 NOTE — Patient Instructions (Signed)

## 2017-06-08 ENCOUNTER — Encounter: Payer: 59 | Admitting: Physical Therapy

## 2017-06-08 ENCOUNTER — Telehealth: Payer: Self-pay | Admitting: Physical Therapy

## 2017-06-08 ENCOUNTER — Other Ambulatory Visit: Payer: Self-pay | Admitting: Oncology

## 2017-06-08 NOTE — Telephone Encounter (Signed)
Called pt because she did not show for her 1:45 appointment. Pt states she thought her appointments this week were on Wednesday and Friday. Educated pt about her appointment times on Wednesday and Friday.   Allyson Sabal [place], [place] 06/08/17 1:57 PM

## 2017-06-09 ENCOUNTER — Other Ambulatory Visit: Payer: Self-pay | Admitting: Family Medicine

## 2017-06-10 ENCOUNTER — Encounter: Payer: Self-pay | Admitting: Physical Therapy

## 2017-06-10 ENCOUNTER — Ambulatory Visit: Payer: 59 | Admitting: Physical Therapy

## 2017-06-10 DIAGNOSIS — M25612 Stiffness of left shoulder, not elsewhere classified: Secondary | ICD-10-CM

## 2017-06-10 DIAGNOSIS — M25611 Stiffness of right shoulder, not elsewhere classified: Secondary | ICD-10-CM

## 2017-06-10 DIAGNOSIS — Z853 Personal history of malignant neoplasm of breast: Secondary | ICD-10-CM | POA: Diagnosis not present

## 2017-06-10 DIAGNOSIS — I972 Postmastectomy lymphedema syndrome: Secondary | ICD-10-CM | POA: Diagnosis not present

## 2017-06-10 DIAGNOSIS — M25511 Pain in right shoulder: Secondary | ICD-10-CM

## 2017-06-10 DIAGNOSIS — M6281 Muscle weakness (generalized): Secondary | ICD-10-CM

## 2017-06-10 DIAGNOSIS — L599 Disorder of the skin and subcutaneous tissue related to radiation, unspecified: Secondary | ICD-10-CM

## 2017-06-10 DIAGNOSIS — C50911 Malignant neoplasm of unspecified site of right female breast: Secondary | ICD-10-CM | POA: Diagnosis not present

## 2017-06-10 DIAGNOSIS — R293 Abnormal posture: Secondary | ICD-10-CM

## 2017-06-10 DIAGNOSIS — M79621 Pain in right upper arm: Secondary | ICD-10-CM

## 2017-06-10 NOTE — Therapy (Signed)
[place], [place], [address] Phone: [phone]   Fax:  [phone]  Physical Therapy Treatment  Patient Details  Name: [name] MRN: [id number] Date of Birth: [birth year] Referring Provider: Dr. Jana Hakim  Encounter Date: 06/10/2017      PT End of Session - 06/10/17 1449    Visit Number 11   Number of Visits 21   Date for PT Re-Evaluation 07/08/17   PT Start Time 1655   PT Stop Time 1433   PT Time Calculation (min) 41 min   Activity Tolerance Patient tolerated treatment well   Behavior During Therapy [facility] for tasks assessed/performed      Past Medical History:  Diagnosis Date  . Breast cancer ([place])   . Breast cancer of lower-inner quadrant of right female breast ([place]) 08/08/2015  . Colon polyps   . DDD (degenerative disc disease), cervical   . Degenerative joint disease   . Diabetes mellitus without complication ([place])   . Diverticulosis   . Gallstones   . GERD (gastroesophageal reflux disease)   . Headache(784.0)    migraine like per patient  . Hypertension   . Low back pain    dr phillips, pain management  . Osteoarthritis     Past Surgical History:  Procedure Laterality Date  . CHOLECYSTECTOMY  10/28/2011   Procedure: LAPAROSCOPIC CHOLECYSTECTOMY WITH INTRAOPERATIVE CHOLANGIOGRAM;  Surgeon: Earnstine Regal, MD;  [facility];  Service: General;  Laterality: N/A;  . COLONOSCOPY WITH ESOPHAGOGASTRODUODENOSCOPY (EGD)  02/03/2013   with Propofol  . EXAM UNDER ANESTHESIA WITH MANIPULATION OF KNEE Right 05/30/2003  . EXAM UNDER ANESTHESIA WITH MANIPULATION OF KNEE Left 12/27/2002  . GANGLION CYST EXCISION Right    right wrist  . KNEE ARTHROSCOPY Bilateral 01/02/2000  . KNEE ARTHROSCOPY Right   . LAPAROSCOPIC VAGINAL HYSTERECTOMY WITH SALPINGO OOPHORECTOMY Bilateral 07/13/2006  . LIPOMA EXCISION Right 02/21/2008   hip  . MASS EXCISION  08/24/2012   Procedure: EXCISION MASS;   Surgeon: Earnstine Regal, MD;  Location: [place];  Service: General;  Laterality: Left;  excisie soft tissue masses left shoulder(back) & left upper arm  . MASS EXCISION Left 01/24/2014   Procedure: EXCISION SOFT TISSUE MASSES LOWER LEFT BACK;  Surgeon: Earnstine Regal, MD;  Location: [place];  Service: General;  Laterality: Left;  Marland Kitchen MASS EXCISION Right 04/30/2016   Procedure: EXCISION OF SKIN RIGHT CHEST WALL;  Surgeon: Autumn Messing III, MD;  Location: [place];  Service: General;  Laterality: Right;  EXCISION OF SKIN RIGHT CHEST WALL  . MASTECTOMY W/ SENTINEL NODE BIOPSY Right 10/01/2015   Procedure: RIGHT MASTECTOMY WITH SENTINEL LYMPH NODE BIOPSY;  Surgeon: Autumn Messing III, MD;  Location: [place];  Service: General;  Laterality: Right;  . PORT-A-CATH REMOVAL Left 04/30/2016   Procedure: REMOVAL PORT-A-CATH;  Surgeon: Autumn Messing III, MD;  Location: [place];  Service: General;  Laterality: Left;  REMOVAL PORT-A-CATH  . PORTACATH PLACEMENT Left 11/01/2015   Procedure: INSERTION PORT-A-CATH;  Surgeon: Autumn Messing III, MD;  Location: [place];  Service: General;  Laterality: Left;  . REPLACEMENT UNICONDYLAR JOINT KNEE Left 04/20/2001  . REVISION TOTAL KNEE ARTHROPLASTY Right 06/18/2004  . REVISION TOTAL KNEE ARTHROPLASTY Left 11/15/2002  . REVISION TOTAL KNEE ARTHROPLASTY Left 10/12/2001  . REVISION TOTAL KNEE ARTHROPLASTY Right 07/09/2015  . TONSILLECTOMY    . TOTAL KNEE ARTHROPLASTY Right 04/25/2003  . TOTAL KNEE ARTHROPLASTY Left  06/25/2009    There were no vitals filed for this visit.      Subjective Assessment - 06/10/17 1354    Subjective I am real sore under my right arm. 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 I don't know if I worked in the kitchen too much. It feels like it is swollen. I am going to [facility] today after this appointment to get a compression bra.    Pertinent  History breast cancer with right  mastectomy with 20 lymph nodes removed in january 2017, chemo with peripheral neuropathy  and  had to have more skin removed before radiation radiation with skin disruption which was concluded last of October.  Past history includes multiple levels of degenerative disc disease in neck and spine and 4 knee surgeries on each knee with the last one in October 2016    Patient Stated Goals to get some range of motion in my right arm, tell me what I am doing wrong to cause this swelling in my chest    Currently in Pain? Yes   Pain Score 3    Pain Location --  trunk   Pain Orientation Right   Pain Descriptors / Indicators Sore   Pain Type Acute pain   Pain Onset Yesterday   Pain Frequency Constant            [facility] PT Assessment - 06/10/17 0001      AROM   Right Shoulder ABduction 146 Degrees                     [facility] Adult PT Treatment/Exercise - 06/10/17 0001      Manual Therapy   Soft tissue mobilization to R pec in areas of tightness   Myofascial Release --   Manual Lymphatic Drainage (MLD) In Supine: Short neck, superficial and deep abdominals, Rt inguinal and Lt axillary nodes, Rt axillo-inguinal and anterior inter-axillary anstomosis then Rt UE from dorsal hand to lateral shoulder working from proximal to distal and retracing all steps, also focusing at Rt lateral trunk inferior to axilla where pt c/o most pain/swelling                        Long Term Clinic Goals - 06/10/17 1356      CC Long Term Goal  #1   Title Patient with verbalize an understanding of lymphedema risk reduction precautions   Baseline 06/10/17- pt able to verbalize this independently   Time 4   Period Weeks   Status Achieved     CC Long Term Goal  #2   Title Patient will be independent in a home exercise program for continued strengthening and stretching   Baseline Pt states she is independent in strength after breast cancer program- pt  reports she completes it once a day   Time 4   Period Weeks   Status Partially Met     CC Long Term Goal  #3   Title Patient will improve right shoulder abduction to 130 degrees with no pain  so that she can perform dressing tasks indpendently    Baseline 92, 06/01/17- 122 degrees, 06/10/17- 146 degrees   Time 4   Period Weeks   Status Achieved     CC Long Term Goal  #4   Title Pt will demonstrate 130 degrees of right shoulder flexion to allow her to reach items overhead   Baseline  85, 06/01/17- 155 degrees   Time 4   Period Weeks   Status Achieved     CC Long Term Goal  #5   Title Pt will obtain appropriate compression bra for long term managment of lymphedema, and an appropriate compression sleeve for prophylactic use   Baseline 06/01/17- pt is getting measured for a compression bra on 06/10/17, sleeve ordered last week   Time 4   Period Weeks   Status On-going     CC Long Term Goal  #6   Title Pt will verbalize a 75% improvement in pain in right trunk to allow improved comfort   Time 4   Period Weeks   Target Date 07/08/17     Additional Goals   Additional Goals Yes            Plan - 06/10/17 1450    Clinical Impression Statement Patient has now met ROM goals for therapy and is able to independently verbalize lymphedema risk reduction practices. She is having increased pain in her right lateral trunk inferior to her axilla. She feels it is flared up after she used her arm more this past weekend. She is getting a compression bra from [facility]. Pt would benefit from additional skilled PT visits to progress pt towards independence with a home exericse program and to help decrease pain in right lateral trunk and decrease tightness in right pec.    Rehab Potential Good   Clinical Impairments Affecting Rehab Potential 20 lymph nodes removed, previous radiation    PT Frequency 2x / week   PT Duration 4 weeks   PT Treatment/Interventions ADLs/Self Care Home  Management;Patient/family education;Manual techniques;Scar mobilization;Passive range of motion;Therapeutic activities;Therapeutic exercise;Manual lymph drainage;Taping;Vasopneumatic Device   PT Next Visit Plan Assess independence with supine scapular exercises, Continue myofascial work and soft tissue work  to right upper quadrant and axilla, MLD R lateral trunk   PT Home Exercise Plan supine scap, strength ABC program   Consulted and Agree with Plan of Care Patient      Patient will benefit from skilled therapeutic intervention in order to improve the following deficits and impairments:  Decreased knowledge of precautions, Decreased scar mobility, Decreased strength, Pain, Impaired UE functional use, Increased fascial restricitons, Postural dysfunction, Decreased range of motion, Increased edema  Visit Diagnosis: Stiffness of right shoulder, not elsewhere classified - Plan: PT plan of care cert/re-cert  Acute pain of right shoulder - Plan: PT plan of care cert/re-cert  Muscle weakness (generalized) - Plan: PT plan of care cert/re-cert  Stiffness of left shoulder, not elsewhere classified - Plan: PT plan of care cert/re-cert  Stiffness of right shoulder joint - Plan: PT plan of care cert/re-cert  Abnormal posture - Plan: PT plan of care cert/re-cert  Pain in right upper arm - Plan: PT plan of care cert/re-cert  Postmastectomy lymphedema - Plan: PT plan of care cert/re-cert  Disorder of the skin and subcutaneous tissue related to radiation, unspecified - Plan: PT plan of care cert/re-cert     Problem List Patient Active Problem List   Diagnosis Date Noted  . Insomnia 07/16/2016  . Left groin pain 11/30/2015  . Severe obesity (BMI >= 40) ([place]) 10/26/2015  . Malignant neoplasm of lower-inner quadrant of right breast of female, estrogen receptor positive ([place]) 08/08/2015  . Diabetes mellitus without complication ([place]) 95/05/3266  . Paraspinous mass, left 03/31/2013  . Internal  hemorrhoid 02/17/2013  . Abdominal pain, other specified site 02/17/2013  . Neoplasm of soft tissue, left  shoulder and left upper arm 08/03/2012  . Lightheadedness 01/29/2012  . Cholelithiasis with cholecystitis 10/08/2011  . Hypokalemia 09/17/2011  . GERD 08/08/2010  . GOUT 12/18/2008  . Essential hypertension 03/15/2007  . HEADACHE 03/15/2007    Allyson Sabal [facility] 06/10/2017, 5:08 PM  [place], [place], [address] Phone: [phone]   Fax:  [phone]  Name: [name] MRN: [id number] Date of Birth: 05/09/1961  Manus Gunning, PT 06/10/17 5:08 PM

## 2017-06-11 ENCOUNTER — Encounter: Payer: Self-pay | Admitting: Family Medicine

## 2017-06-12 ENCOUNTER — Ambulatory Visit: Payer: 59 | Admitting: Physical Therapy

## 2017-06-12 ENCOUNTER — Encounter: Payer: Self-pay | Admitting: Physical Therapy

## 2017-06-12 DIAGNOSIS — I972 Postmastectomy lymphedema syndrome: Secondary | ICD-10-CM | POA: Diagnosis not present

## 2017-06-12 DIAGNOSIS — M25511 Pain in right shoulder: Secondary | ICD-10-CM

## 2017-06-12 DIAGNOSIS — M25611 Stiffness of right shoulder, not elsewhere classified: Secondary | ICD-10-CM

## 2017-06-12 DIAGNOSIS — M6281 Muscle weakness (generalized): Secondary | ICD-10-CM

## 2017-06-12 NOTE — Therapy (Signed)
[place], [place], [address] Phone: [phone]   Fax:  [phone]  Physical Therapy Treatment  Patient Details  Name: [name] MRN: [id number] Date of Birth: 1961/05/21 Referring Provider: Dr. Jana Hakim  Encounter Date: 06/12/2017      PT End of Session - 06/12/17 1102    Visit Number 12   Number of Visits 21   Date for PT Re-Evaluation 07/08/17   PT Start Time 1020   PT Stop Time 1102   PT Time Calculation (min) 42 min   Activity Tolerance Patient tolerated treatment well   Behavior During Therapy [facility] for tasks assessed/performed      Past Medical History:  Diagnosis Date  . Breast cancer ([place])   . Breast cancer of lower-inner quadrant of right female breast ([place]) 08/08/2015  . Colon polyps   . DDD (degenerative disc disease), cervical   . Degenerative joint disease   . Diabetes mellitus without complication ([place])   . Diverticulosis   . Gallstones   . GERD (gastroesophageal reflux disease)   . Headache(784.0)    migraine like per patient  . Hypertension   . Low back pain    dr phillips, pain management  . Osteoarthritis     Past Surgical History:  Procedure Laterality Date  . CHOLECYSTECTOMY  10/28/2011   Procedure: LAPAROSCOPIC CHOLECYSTECTOMY WITH INTRAOPERATIVE CHOLANGIOGRAM;  Surgeon: Earnstine Regal, MD;  Location: [facility];  Service: General;  Laterality: N/A;  . COLONOSCOPY WITH ESOPHAGOGASTRODUODENOSCOPY (EGD)  02/03/2013   with Propofol  . EXAM UNDER ANESTHESIA WITH MANIPULATION OF KNEE Right 05/30/2003  . EXAM UNDER ANESTHESIA WITH MANIPULATION OF KNEE Left 12/27/2002  . GANGLION CYST EXCISION Right    right wrist  . KNEE ARTHROSCOPY Bilateral 01/02/2000  . KNEE ARTHROSCOPY Right   . LAPAROSCOPIC VAGINAL HYSTERECTOMY WITH SALPINGO OOPHORECTOMY Bilateral 07/13/2006  . LIPOMA EXCISION Right 02/21/2008   hip  . MASS EXCISION  08/24/2012   Procedure: EXCISION MASS;   Surgeon: Earnstine Regal, MD;  Location: [place];  Service: General;  Laterality: Left;  excisie soft tissue masses left shoulder(back) & left upper arm  . MASS EXCISION Left 01/24/2014   Procedure: EXCISION SOFT TISSUE MASSES LOWER LEFT BACK;  Surgeon: Earnstine Regal, MD;  Location: [place];  Service: General;  Laterality: Left;  Marland Kitchen MASS EXCISION Right 04/30/2016   Procedure: EXCISION OF SKIN RIGHT CHEST WALL;  Surgeon: Autumn Messing III, MD;  Location: [place];  Service: General;  Laterality: Right;  EXCISION OF SKIN RIGHT CHEST WALL  . MASTECTOMY W/ SENTINEL NODE BIOPSY Right 10/01/2015   Procedure: RIGHT MASTECTOMY WITH SENTINEL LYMPH NODE BIOPSY;  Surgeon: Autumn Messing III, MD;  Location: [place];  Service: General;  Laterality: Right;  . PORT-A-CATH REMOVAL Left 04/30/2016   Procedure: REMOVAL PORT-A-CATH;  Surgeon: Autumn Messing III, MD;  Location: [place];  Service: General;  Laterality: Left;  REMOVAL PORT-A-CATH  . PORTACATH PLACEMENT Left 11/01/2015   Procedure: INSERTION PORT-A-CATH;  Surgeon: Autumn Messing III, MD;  Location: [place];  Service: General;  Laterality: Left;  . REPLACEMENT UNICONDYLAR JOINT KNEE Left 04/20/2001  . REVISION TOTAL KNEE ARTHROPLASTY Right 06/18/2004  . REVISION TOTAL KNEE ARTHROPLASTY Left 11/15/2002  . REVISION TOTAL KNEE ARTHROPLASTY Left 10/12/2001  . REVISION TOTAL KNEE ARTHROPLASTY Right 07/09/2015  . TONSILLECTOMY    . TOTAL KNEE ARTHROPLASTY Right 04/25/2003  . TOTAL KNEE ARTHROPLASTY Left  06/25/2009    There were no vitals filed for this visit.      Subjective Assessment - 06/12/17 1024    Subjective I think my compression bra is too big. I got it on Wednesday. I did not do my exercises because I have a knot on my arm and it really hurts. My wrist was hurting from the anastrozole yesterday.    Pertinent History breast cancer with right  mastectomy with 20 lymph nodes removed in  january 2017, chemo with peripheral neuropathy  and  had to have more skin removed before radiation radiation with skin disruption which was concluded last of October.  Past history includes multiple levels of degenerative disc disease in neck and spine and 4 knee surgeries on each knee with the last one in October 2016    Patient Stated Goals to get some range of motion in my right arm, tell me what I am doing wrong to cause this swelling in my chest    Currently in Pain? No/denies   Pain Score 0-No pain                         [facility] Adult PT Treatment/Exercise - 06/12/17 0001      Shoulder Exercises: Supine   Horizontal ABduction Strengthening;Both;10 reps;Theraband   Theraband Level (Shoulder Horizontal ABduction) Level 1 (Yellow)   External Rotation Strengthening;Both;10 reps;Theraband   Theraband Level (Shoulder External Rotation) Level 1 (Yellow)   Flexion Strengthening;Both;10 reps;Theraband  narrow and wide grip   Theraband Level (Shoulder Flexion) Level 1 (Yellow)   Other Supine Exercises D2 x 10 reps bilaterally with yellow band     Manual Therapy   Soft tissue mobilization to R pec in areas of tightness   Passive ROM with gentle pulling into right shoulder abduction and flexion with no complaints of pain today                        Long Term Clinic Goals - 06/10/17 1356      CC Long Term Goal  #1   Title Patient with verbalize an understanding of lymphedema risk reduction precautions   Baseline 06/10/17- pt able to verbalize this independently   Time 4   Period Weeks   Status Achieved     CC Long Term Goal  #2   Title Patient will be independent in a home exercise program for continued strengthening and stretching   Baseline Pt states she is independent in strength after breast cancer program- pt reports she completes it once a day   Time 4   Period Weeks   Status Partially Met     CC Long Term Goal  #3   Title Patient will improve  right shoulder abduction to 130 degrees with no pain  so that she can perform dressing tasks indpendently    Baseline 92, 06/01/17- 122 degrees, 06/10/17- 146 degrees   Time 4   Period Weeks   Status Achieved     CC Long Term Goal  #4   Title Pt will demonstrate 130 degrees of right shoulder flexion to allow her to reach items overhead   Baseline 85, 06/01/17- 155 degrees   Time 4   Period Weeks   Status Achieved     CC Long Term Goal  #5   Title Pt will obtain appropriate compression bra for long term managment of lymphedema, and an appropriate compression sleeve for prophylactic use  Baseline 06/01/17- pt is getting measured for a compression bra on 06/10/17, sleeve ordered last week   Time 4   Period Weeks   Status On-going     CC Long Term Goal  #6   Title Pt will verbalize a 75% improvement in pain in right trunk to allow improved comfort   Time 4   Period Weeks   Target Date 07/08/17     Additional Goals   Additional Goals Yes            Plan - 06/12/17 1200    Clinical Impression Statement Continued with PROM to R shoulder today with pt not reporting any pain. Assessed pt's independence with supine scapular exercises and pt was able to do without any pain. She had not done them since she was instructed because she was having pain in her upper arm possibly from a lipoma which she will follow up with the doctor about. Performed soft tissue mobilization to right chest to decrease tightness.    Rehab Potential Good   Clinical Impairments Affecting Rehab Potential 20 lymph nodes removed, previous radiation    PT Frequency 2x / week   PT Duration 4 weeks   PT Treatment/Interventions ADLs/Self Care Home Management;Patient/family education;Manual techniques;Scar mobilization;Passive range of motion;Therapeutic activities;Therapeutic exercise;Manual lymph drainage;Taping;Vasopneumatic Device   PT Next Visit Plan see if pt went to dr about possible lipoma, Continue myofascial work  and soft tissue work  to right upper quadrant and axilla, MLD R lateral trunk   PT Home Exercise Plan supine scap, strength ABC program   Consulted and Agree with Plan of Care Patient      Patient will benefit from skilled therapeutic intervention in order to improve the following deficits and impairments:  Decreased knowledge of precautions, Decreased scar mobility, Decreased strength, Pain, Impaired UE functional use, Increased fascial restricitons, Postural dysfunction, Decreased range of motion, Increased edema  Visit Diagnosis: Stiffness of right shoulder, not elsewhere classified  Acute pain of right shoulder  Muscle weakness (generalized)     Problem List Patient Active Problem List   Diagnosis Date Noted  . Insomnia 07/16/2016  . Left groin pain 11/30/2015  . Severe obesity (BMI >= 40) ([place]) 10/26/2015  . Malignant neoplasm of lower-inner quadrant of right breast of female, estrogen receptor positive ([place]) 08/08/2015  . Diabetes mellitus without complication ([place]) 31/43/8887  . Paraspinous mass, left 03/31/2013  . Internal hemorrhoid 02/17/2013  . Abdominal pain, other specified site 02/17/2013  . Neoplasm of soft tissue, left shoulder and left upper arm 08/03/2012  . Lightheadedness 01/29/2012  . Cholelithiasis with cholecystitis 10/08/2011  . Hypokalemia 09/17/2011  . GERD 08/08/2010  . GOUT 12/18/2008  . Essential hypertension 03/15/2007  . HEADACHE 03/15/2007    [facility] 06/12/2017, 12:02 PM  [place], [place], [address] Phone: [phone]   Fax:  [phone]  Name: BRION HEDGES MRN: [id number] Date of Birth: Oct 13, 1960  Manus Gunning, PT 06/12/17 12:02 PM

## 2017-06-22 ENCOUNTER — Ambulatory Visit: Payer: 59 | Attending: Oncology

## 2017-06-22 DIAGNOSIS — I972 Postmastectomy lymphedema syndrome: Secondary | ICD-10-CM | POA: Diagnosis not present

## 2017-06-22 DIAGNOSIS — M25611 Stiffness of right shoulder, not elsewhere classified: Secondary | ICD-10-CM

## 2017-06-22 DIAGNOSIS — M25511 Pain in right shoulder: Secondary | ICD-10-CM | POA: Diagnosis not present

## 2017-06-22 NOTE — Therapy (Signed)
[facility] [address] [place], [place], [address] Phone: [phone]   Fax:  [phone]  Physical Therapy Treatment  Patient Details  Name: [name] MRN: [id number] Date of Birth: 10/25/1960 Referring Provider: Dr. [name]  Encounter Date: 06/22/2017      PT End of Session - 06/22/17 0932    Visit Number 13   Number of Visits 21   Date for PT Re-Evaluation 07/08/17   PT Start Time 0850   PT Stop Time 0931   PT Time Calculation (min) 41 min   Activity Tolerance Patient tolerated treatment well   Behavior During Therapy [facility] for tasks assessed/performed      Past Medical History:  Diagnosis Date  . Breast cancer ([facility])   . Breast cancer of lower-inner quadrant of right female breast ([facility]) 08/08/2015  . Colon polyps   . DDD (degenerative disc disease), cervical   . Degenerative joint disease   . Diabetes mellitus without complication ([facility])   . Diverticulosis   . Gallstones   . GERD (gastroesophageal reflux disease)   . Headache(784.0)    migraine like per patient  . Hypertension   . Low back pain    dr phillips, pain management  . Osteoarthritis     Past Surgical History:  Procedure Laterality Date  . CHOLECYSTECTOMY  10/28/2011   Procedure: LAPAROSCOPIC CHOLECYSTECTOMY WITH INTRAOPERATIVE CHOLANGIOGRAM;  Surgeon: Todd M Gerkin, MD;  [facility];  Service: General;  Laterality: N/A;  . COLONOSCOPY WITH ESOPHAGOGASTRODUODENOSCOPY (EGD)  02/03/2013   with Propofol  . EXAM UNDER ANESTHESIA WITH MANIPULATION OF KNEE Right 05/30/2003  . EXAM UNDER ANESTHESIA WITH MANIPULATION OF KNEE Left 12/27/2002  . GANGLION CYST EXCISION Right    right wrist  . KNEE ARTHROSCOPY Bilateral 01/02/2000  . KNEE ARTHROSCOPY Right   . LAPAROSCOPIC VAGINAL HYSTERECTOMY WITH SALPINGO OOPHORECTOMY Bilateral 07/13/2006  . LIPOMA EXCISION Right 02/21/2008   hip  . MASS EXCISION  08/24/2012   Procedure: EXCISION MASS;   Surgeon: Todd M Gerkin, MD;  Location: [facility];  Service: General;  Laterality: Left;  excisie soft tissue masses left shoulder(back) & left upper arm  . MASS EXCISION Left 01/24/2014   Procedure: EXCISION SOFT TISSUE MASSES LOWER LEFT BACK;  Surgeon: Todd M Gerkin, MD;  Location: [facility];  Service: General;  Laterality: Left;  . MASS EXCISION Right 04/30/2016   Procedure: EXCISION OF SKIN RIGHT CHEST WALL;  Surgeon: Paul Toth III, MD;  Location: [facility];  Service: General;  Laterality: Right;  EXCISION OF SKIN RIGHT CHEST WALL  . MASTECTOMY W/ SENTINEL NODE BIOPSY Right 10/01/2015   Procedure: RIGHT MASTECTOMY WITH SENTINEL LYMPH NODE BIOPSY;  Surgeon: Paul Toth III, MD;  [facility];  Service: General;  Laterality: Right;  . PORT-A-CATH REMOVAL Left 04/30/2016   Procedure: REMOVAL PORT-A-CATH;  Surgeon: Paul Toth III, MD;  Location: [facility];  Service: General;  Laterality: Left;  REMOVAL PORT-A-CATH  . PORTACATH PLACEMENT Left 11/01/2015   Procedure: INSERTION PORT-A-CATH;  Surgeon: Paul Toth III, MD;  Location: [facility];  Service: General;  Laterality: Left;  . REPLACEMENT UNICONDYLAR JOINT KNEE Left 04/20/2001  . REVISION TOTAL KNEE ARTHROPLASTY Right 06/18/2004  . REVISION TOTAL KNEE ARTHROPLASTY Left 11/15/2002  . REVISION TOTAL KNEE ARTHROPLASTY Left 10/12/2001  . REVISION TOTAL KNEE ARTHROPLASTY Right 07/09/2015  . TONSILLECTOMY    . TOTAL KNEE ARTHROPLASTY Right 04/25/2003  . TOTAL KNEE ARTHROPLASTY Left   06/25/2009    There were no vitals filed for this visit.      Subjective Assessment - 06/22/17 0851    Subjective I get my new compression sleeve and glove today and I plan on walking across the street to [employer] and exchange my compression bra that is just too big. My wrists are sore all the time from the anastrozole and I didn't get to the doctor yet about the lipoma on my arm. I'm not  really hurting today as much as I just feel stiff in my Rt shoulder and arm.   Pertinent History breast cancer with right  mastectomy with 20 lymph nodes removed in january 2017, chemo with peripheral neuropathy  and  had to have more skin removed before radiation radiation with skin disruption which was concluded last of October.  Past history includes multiple levels of degenerative disc disease in neck and spine and 4 knee surgeries on each knee with the last one in October 2016    Patient Stated Goals to get some range of motion in my right arm, tell me what I am doing wrong to cause this swelling in my chest                          [facility] Adult PT Treatment/Exercise - 06/22/17 0001      Manual Therapy   Manual Therapy Neural Stretch;Passive ROM;Soft tissue mobilization   Soft tissue mobilization to Rt pec in areas of tightness   Passive ROM with gentle pulling into right shoulder abduction and flexion with no complaints of pain today   Neural Stretch Rt UE during P/ROM to tolerance                        Long Term Clinic Goals - 06/10/17 1356      CC Long Term Goal  #1   Title Patient with verbalize an understanding of lymphedema risk reduction precautions   Baseline 06/10/17- pt able to verbalize this independently   Time 4   Period Weeks   Status Achieved     CC Long Term Goal  #2   Title Patient will be independent in a home exercise program for continued strengthening and stretching   Baseline Pt states she is independent in strength after breast cancer program- pt reports she completes it once a day   Time 4   Period Weeks   Status Partially Met     CC Long Term Goal  #3   Title Patient will improve right shoulder abduction to 130 degrees with no pain  so that she can perform dressing tasks indpendently    Baseline 92, 06/01/17- 122 degrees, 06/10/17- 146 degrees   Time 4   Period Weeks   Status Achieved     CC Long Term Goal  #4   Title  Pt will demonstrate 130 degrees of right shoulder flexion to allow her to reach items overhead   Baseline 85, 06/01/17- 155 degrees   Time 4   Period Weeks   Status Achieved     CC Long Term Goal  #5   Title Pt will obtain appropriate compression bra for long term managment of lymphedema, and an appropriate compression sleeve for prophylactic use   Baseline 06/01/17- pt is getting measured for a compression bra on 06/10/17, sleeve ordered last week   Time 4   Period Weeks   Status On-going       CC Long Term Goal  #6   Title Pt will verbalize a 75% improvement in pain in right trunk to allow improved comfort   Time 4   Period Weeks   Target Date 07/08/17     Additional Goals   Additional Goals Yes            Plan - 06/22/17 0932    Clinical Impression Statement Continued with focus on manual therapy and pt had some pain at end ranges but with VCs to relax her pain would decrease. Spent time educating pt on how tension is probably causing alot of her muscle spasms and needs to try to focus on relaxing shoulder throughout day whenever she thinks about it.    Rehab Potential Good   Clinical Impairments Affecting Rehab Potential 20 lymph nodes removed, previous radiation    PT Frequency 2x / week   PT Duration 4 weeks   PT Treatment/Interventions ADLs/Self Care Home Management;Patient/family education;Manual techniques;Scar mobilization;Passive range of motion;Therapeutic activities;Therapeutic exercise;Manual lymph drainage;Taping;Vasopneumatic Device   PT Next Visit Plan see if pt went to dr about possible lipoma, Continue myofascial work and soft tissue work  to right upper quadrant and axilla, MLD R lateral trunk   Consulted and Agree with Plan of Care Patient      Patient will benefit from skilled therapeutic intervention in order to improve the following deficits and impairments:  Decreased knowledge of precautions, Decreased scar mobility, Decreased strength, Pain, Impaired UE  functional use, Increased fascial restricitons, Postural dysfunction, Decreased range of motion, Increased edema  Visit Diagnosis: Stiffness of right shoulder, not elsewhere classified  Acute pain of right shoulder     Problem List Patient Active Problem List   Diagnosis Date Noted  . Insomnia 07/16/2016  . Left groin pain 11/30/2015  . Severe obesity (BMI >= 40) ([place]) 10/26/2015  . Malignant neoplasm of lower-inner quadrant of right breast of female, estrogen receptor positive ([place]) 08/08/2015  . Diabetes mellitus without complication ([place]) 62/83/1517  . Paraspinous mass, left 03/31/2013  . Internal hemorrhoid 02/17/2013  . Abdominal pain, other specified site 02/17/2013  . Neoplasm of soft tissue, left shoulder and left upper arm 08/03/2012  . Lightheadedness 01/29/2012  . Cholelithiasis with cholecystitis 10/08/2011  . Hypokalemia 09/17/2011  . GERD 08/08/2010  . GOUT 12/18/2008  . Essential hypertension 03/15/2007  . HEADACHE 03/15/2007    Otelia Limes , PTA 06/22/2017, 9:35 AM  [place], [place], [address] Phone: [phone]   Fax:  [phone]  Name: LUCETTA BAEHR MRN: [id number] Date of Birth: 08/21/1961

## 2017-06-24 ENCOUNTER — Ambulatory Visit: Payer: 59 | Admitting: Physical Therapy

## 2017-06-24 DIAGNOSIS — Z853 Personal history of malignant neoplasm of breast: Secondary | ICD-10-CM | POA: Diagnosis not present

## 2017-06-24 DIAGNOSIS — C50911 Malignant neoplasm of unspecified site of right female breast: Secondary | ICD-10-CM | POA: Diagnosis not present

## 2017-06-24 DIAGNOSIS — M25611 Stiffness of right shoulder, not elsewhere classified: Secondary | ICD-10-CM

## 2017-06-24 DIAGNOSIS — M25511 Pain in right shoulder: Secondary | ICD-10-CM

## 2017-06-24 NOTE — Therapy (Signed)
[place], [place], [address] Phone: [phone]   Fax:  [phone]  Physical Therapy Treatment  Patient Details  Name: [name] MRN: [id number] Date of Birth: 07/24/1961 Referring Provider: Dr. Jana Hakim  Encounter Date: 06/24/2017      PT End of Session - 06/24/17 1247    Visit Number 14   Number of Visits 21   Date for PT Re-Evaluation 07/08/17   PT Start Time 5102  pt. late   PT Stop Time 1018   PT Time Calculation (min) 39 min   Activity Tolerance Patient tolerated treatment well   Behavior During Therapy [facility] for tasks assessed/performed      Past Medical History:  Diagnosis Date  . Breast cancer ([place])   . Breast cancer of lower-inner quadrant of right female breast ([place]) 08/08/2015  . Colon polyps   . DDD (degenerative disc disease), cervical   . Degenerative joint disease   . Diabetes mellitus without complication ([place])   . Diverticulosis   . Gallstones   . GERD (gastroesophageal reflux disease)   . Headache(784.0)    migraine like per patient  . Hypertension   . Low back pain    dr phillips, pain management  . Osteoarthritis     Past Surgical History:  Procedure Laterality Date  . CHOLECYSTECTOMY  10/28/2011   Procedure: LAPAROSCOPIC CHOLECYSTECTOMY WITH INTRAOPERATIVE CHOLANGIOGRAM;  Surgeon: Earnstine Regal, MD;  [facility];  Service: General;  Laterality: N/A;  . COLONOSCOPY WITH ESOPHAGOGASTRODUODENOSCOPY (EGD)  02/03/2013   with Propofol  . EXAM UNDER ANESTHESIA WITH MANIPULATION OF KNEE Right 05/30/2003  . EXAM UNDER ANESTHESIA WITH MANIPULATION OF KNEE Left 12/27/2002  . GANGLION CYST EXCISION Right    right wrist  . KNEE ARTHROSCOPY Bilateral 01/02/2000  . KNEE ARTHROSCOPY Right   . LAPAROSCOPIC VAGINAL HYSTERECTOMY WITH SALPINGO OOPHORECTOMY Bilateral 07/13/2006  . LIPOMA EXCISION Right 02/21/2008   hip  . MASS EXCISION  08/24/2012   Procedure: EXCISION  MASS;  Surgeon: Earnstine Regal, MD;  Location: [place];  Service: General;  Laterality: Left;  excisie soft tissue masses left shoulder(back) & left upper arm  . MASS EXCISION Left 01/24/2014   Procedure: EXCISION SOFT TISSUE MASSES LOWER LEFT BACK;  Surgeon: Earnstine Regal, MD;  Location: [place];  Service: General;  Laterality: Left;  Marland Kitchen MASS EXCISION Right 04/30/2016   Procedure: EXCISION OF SKIN RIGHT CHEST WALL;  Surgeon: Autumn Messing III, MD;  Location: [place];  Service: General;  Laterality: Right;  EXCISION OF SKIN RIGHT CHEST WALL  . MASTECTOMY W/ SENTINEL NODE BIOPSY Right 10/01/2015   Procedure: RIGHT MASTECTOMY WITH SENTINEL LYMPH NODE BIOPSY;  Surgeon: Autumn Messing III, MD;  Location: [place];  Service: General;  Laterality: Right;  . PORT-A-CATH REMOVAL Left 04/30/2016   Procedure: REMOVAL PORT-A-CATH;  Surgeon: Autumn Messing III, MD;  Location: [place];  Service: General;  Laterality: Left;  REMOVAL PORT-A-CATH  . PORTACATH PLACEMENT Left 11/01/2015   Procedure: INSERTION PORT-A-CATH;  Surgeon: Autumn Messing III, MD;  Location: [place];  Service: General;  Laterality: Left;  . REPLACEMENT UNICONDYLAR JOINT KNEE Left 04/20/2001  . REVISION TOTAL KNEE ARTHROPLASTY Right 06/18/2004  . REVISION TOTAL KNEE ARTHROPLASTY Left 11/15/2002  . REVISION TOTAL KNEE ARTHROPLASTY Left 10/12/2001  . REVISION TOTAL KNEE ARTHROPLASTY Right 07/09/2015  . TONSILLECTOMY    . TOTAL KNEE ARTHROPLASTY Right 04/25/2003  . TOTAL  KNEE ARTHROPLASTY Left 06/25/2009    There were no vitals filed for this visit.      Subjective Assessment - 06/24/17 0941    Subjective I do the band exercises and stuff at home.  I know for a fact that my hand is swelling.  I have to pick the sleeve up today.  The shoulder pain is what bothers me. Is on medication for pain.  I think the therapy helped me a lot as far as the ROM.  The anastrozole that they have me  on--my wrist hurts so bad. Was told she had left frozen shoulder in 2016 and recovered from that. Hasn't asked doctor yet about possible lipoma.  Her daughter just had a baby yesterday.   Pertinent History breast cancer with right  mastectomy with 20 lymph nodes removed in january 2017, chemo with peripheral neuropathy  and  had to have more skin removed before radiation radiation with skin disruption which was concluded last of October.  Past history includes multiple levels of degenerative disc disease in neck and spine and 4 knee surgeries on each knee with the last one in October 2016    Currently in Pain? Yes   Pain Score 4    Pain Location Shoulder   Pain Orientation Right   Pain Descriptors / Indicators Aching   Aggravating Factors  nothing in particular   Pain Relieving Factors medication               LYMPHEDEMA/ONCOLOGY QUESTIONNAIRE - 06/24/17 0946      Right Upper Extremity Lymphedema   15 cm Proximal to Olecranon Process 38.4 cm   10 cm Proximal to Olecranon Process 37.8 cm   Olecranon Process 31.9 cm   15 cm Proximal to Ulnar Styloid Process 31.8 cm   10 cm Proximal to Ulnar Styloid Process 27 cm   Just Proximal to Ulnar Styloid Process 22.2 cm   Across Hand at [employer] 23.8 cm   At [place] of 2nd Digit 7.8 cm                  [facility] Adult PT Treatment/Exercise - 06/24/17 0001      Manual Therapy   Edema Management right UE circumference measurements taken   Manual Lymphatic Drainage (MLD) In Supine: Short neck, superficial and deep abdominals, Rt inguinal and Lt axillary nodes, Rt axillo-inguinal and anterior inter-axillary anstomosis then Rt UE from dorsal hand to lateral shoulder working from proximal to distal and retracing all steps, in left sidelying, posterior interaxillary anastomosis and right axillo-inguinal anastomosis                        Long Term Clinic Goals - 06/10/17 1356      CC Long Term Goal  #1   Title  Patient with verbalize an understanding of lymphedema risk reduction precautions   Baseline 06/10/17- pt able to verbalize this independently   Time 4   Period Weeks   Status Achieved     CC Long Term Goal  #2   Title Patient will be independent in a home exercise program for continued strengthening and stretching   Baseline Pt states she is independent in strength after breast cancer program- pt reports she completes it once a day   Time 4   Period Weeks   Status Partially Met     CC Long Term Goal  #3   Title Patient will improve right shoulder abduction to 130  degrees with no pain  so that she can perform dressing tasks indpendently    Baseline 92, 06/01/17- 122 degrees, 06/10/17- 146 degrees   Time 4   Period Weeks   Status Achieved     CC Long Term Goal  #4   Title Pt will demonstrate 130 degrees of right shoulder flexion to allow her to reach items overhead   Baseline 85, 06/01/17- 155 degrees   Time 4   Period Weeks   Status Achieved     CC Long Term Goal  #5   Title Pt will obtain appropriate compression bra for long term managment of lymphedema, and an appropriate compression sleeve for prophylactic use   Baseline 06/01/17- pt is getting measured for a compression bra on 06/10/17, sleeve ordered last week   Time 4   Period Weeks   Status On-going     CC Long Term Goal  #6   Title Pt will verbalize a 75% improvement in pain in right trunk to allow improved comfort   Time 4   Period Weeks   Target Date 07/08/17     Additional Goals   Additional Goals Yes            Plan - 06/24/17 1247    Clinical Impression Statement Pt. came in today complaining of significant right shoulder pain; she also felt like her arm was more swollen.  Focused today on manual lymph drainage as a result, and she did report feeling better at end of session.   Rehab Potential Good   Clinical Impairments Affecting Rehab Potential 20 lymph nodes removed, previous radiation    PT Frequency 2x  / week   PT Duration 4 weeks   PT Treatment/Interventions ADLs/Self Care Home Management;Patient/family education;Manual techniques;Scar mobilization;Passive range of motion;Therapeutic activities;Therapeutic exercise;Manual lymph drainage;Taping;Vasopneumatic Device   PT Next Visit Plan Continue myofascial work and soft tissue work  to right upper quadrant and axilla, MLD Rt lateral trunk and arm   PT Home Exercise Plan supine scap, strength ABC program   Consulted and Agree with Plan of Care Patient      Patient will benefit from skilled therapeutic intervention in order to improve the following deficits and impairments:  Decreased knowledge of precautions, Decreased scar mobility, Decreased strength, Pain, Impaired UE functional use, Increased fascial restricitons, Postural dysfunction, Decreased range of motion, Increased edema  Visit Diagnosis: Stiffness of right shoulder, not elsewhere classified  Acute pain of right shoulder     Problem List Patient Active Problem List   Diagnosis Date Noted  . Insomnia 07/16/2016  . Left groin pain 11/30/2015  . Severe obesity (BMI >= 40) ([place]) 10/26/2015  . Malignant neoplasm of lower-inner quadrant of right breast of female, estrogen receptor positive ([place]) 08/08/2015  . Diabetes mellitus without complication ([place]) 94/85/4627  . Paraspinous mass, left 03/31/2013  . Internal hemorrhoid 02/17/2013  . Abdominal pain, other specified site 02/17/2013  . Neoplasm of soft tissue, left shoulder and left upper arm 08/03/2012  . Lightheadedness 01/29/2012  . Cholelithiasis with cholecystitis 10/08/2011  . Hypokalemia 09/17/2011  . GERD 08/08/2010  . GOUT 12/18/2008  . Essential hypertension 03/15/2007  . HEADACHE 03/15/2007    SALISBURY,DONNA 06/24/2017, 12:50 PM  [place], [place], [address] Phone: [phone]   Fax:  [phone]  Name: [name] MRN: [id number] Date of Birth: [birth year]  Serafina Royals, PT 06/24/17 12:50 PM

## 2017-06-29 ENCOUNTER — Encounter (HOSPITAL_COMMUNITY): Payer: Self-pay | Admitting: Emergency Medicine

## 2017-06-29 ENCOUNTER — Emergency Department (HOSPITAL_COMMUNITY): Payer: 59

## 2017-06-29 ENCOUNTER — Ambulatory Visit: Payer: 59 | Admitting: Physical Therapy

## 2017-06-29 ENCOUNTER — Encounter: Payer: Self-pay | Admitting: Physical Therapy

## 2017-06-29 DIAGNOSIS — Z5321 Procedure and treatment not carried out due to patient leaving prior to being seen by health care provider: Secondary | ICD-10-CM | POA: Insufficient documentation

## 2017-06-29 DIAGNOSIS — M79631 Pain in right forearm: Secondary | ICD-10-CM | POA: Diagnosis not present

## 2017-06-29 DIAGNOSIS — M79601 Pain in right arm: Secondary | ICD-10-CM | POA: Diagnosis not present

## 2017-06-29 DIAGNOSIS — M25611 Stiffness of right shoulder, not elsewhere classified: Secondary | ICD-10-CM | POA: Diagnosis not present

## 2017-06-29 DIAGNOSIS — I972 Postmastectomy lymphedema syndrome: Secondary | ICD-10-CM

## 2017-06-29 DIAGNOSIS — M25511 Pain in right shoulder: Secondary | ICD-10-CM

## 2017-06-29 NOTE — Therapy (Addendum)
[place], [place], [address] Phone: [phone]   Fax:  [phone]  Physical Therapy Treatment  Patient Details  Name: [name] MRN: [id number] Date of Birth: 07/05/1961 Referring Provider: Dr. Jana Hakim  Encounter Date: 06/29/2017      PT End of Session - 06/29/17 1148    Visit Number 15   Number of Visits 21   Date for PT Re-Evaluation 07/08/17   PT Start Time 1103   PT Stop Time 1145   PT Time Calculation (min) 42 min   Activity Tolerance Patient tolerated treatment well   Behavior During Therapy [facility] for tasks assessed/performed      Past Medical History:  Diagnosis Date  . Breast cancer ([place])   . Breast cancer of lower-inner quadrant of right female breast ([place]) 08/08/2015  . Colon polyps   . DDD (degenerative disc disease), cervical   . Degenerative joint disease   . Diabetes mellitus without complication ([place])   . Diverticulosis   . Gallstones   . GERD (gastroesophageal reflux disease)   . Headache(784.0)    migraine like per patient  . Hypertension   . Low back pain    dr phillips, pain management  . Osteoarthritis     Past Surgical History:  Procedure Laterality Date  . CHOLECYSTECTOMY  10/28/2011   Procedure: LAPAROSCOPIC CHOLECYSTECTOMY WITH INTRAOPERATIVE CHOLANGIOGRAM;  Surgeon: Earnstine Regal, MD;  [facility];  Service: General;  Laterality: N/A;  . COLONOSCOPY WITH ESOPHAGOGASTRODUODENOSCOPY (EGD)  02/03/2013   with Propofol  . EXAM UNDER ANESTHESIA WITH MANIPULATION OF KNEE Right 05/30/2003  . EXAM UNDER ANESTHESIA WITH MANIPULATION OF KNEE Left 12/27/2002  . GANGLION CYST EXCISION Right    right wrist  . KNEE ARTHROSCOPY Bilateral 01/02/2000  . KNEE ARTHROSCOPY Right   . LAPAROSCOPIC VAGINAL HYSTERECTOMY WITH SALPINGO OOPHORECTOMY Bilateral 07/13/2006  . LIPOMA EXCISION Right 02/21/2008   hip  . MASS EXCISION  08/24/2012   Procedure: EXCISION MASS;   Surgeon: Earnstine Regal, MD;  Location: [place];  Service: General;  Laterality: Left;  excisie soft tissue masses left shoulder(back) & left upper arm  . MASS EXCISION Left 01/24/2014   Procedure: EXCISION SOFT TISSUE MASSES LOWER LEFT BACK;  Surgeon: Earnstine Regal, MD;  Location: [place];  Service: General;  Laterality: Left;  Marland Kitchen MASS EXCISION Right 04/30/2016   Procedure: EXCISION OF SKIN RIGHT CHEST WALL;  Surgeon: Autumn Messing III, MD;  Location: [place];  Service: General;  Laterality: Right;  EXCISION OF SKIN RIGHT CHEST WALL  . MASTECTOMY W/ SENTINEL NODE BIOPSY Right 10/01/2015   Procedure: RIGHT MASTECTOMY WITH SENTINEL LYMPH NODE BIOPSY;  Surgeon: Autumn Messing III, MD;  Location: [place];  Service: General;  Laterality: Right;  . PORT-A-CATH REMOVAL Left 04/30/2016   Procedure: REMOVAL PORT-A-CATH;  Surgeon: Autumn Messing III, MD;  Location: [place];  Service: General;  Laterality: Left;  REMOVAL PORT-A-CATH  . PORTACATH PLACEMENT Left 11/01/2015   Procedure: INSERTION PORT-A-CATH;  Surgeon: Autumn Messing III, MD;  Location: [place];  Service: General;  Laterality: Left;  . REPLACEMENT UNICONDYLAR JOINT KNEE Left 04/20/2001  . REVISION TOTAL KNEE ARTHROPLASTY Right 06/18/2004  . REVISION TOTAL KNEE ARTHROPLASTY Left 11/15/2002  . REVISION TOTAL KNEE ARTHROPLASTY Left 10/12/2001  . REVISION TOTAL KNEE ARTHROPLASTY Right 07/09/2015  . TONSILLECTOMY    . TOTAL KNEE ARTHROPLASTY Right 04/25/2003  . TOTAL KNEE ARTHROPLASTY Left  06/25/2009    There were no vitals filed for this visit.      Subjective Assessment - 06/29/17 1106    Subjective My exercises are going well. I am having a hard time with this right wrist. I wore my sleeve all day Saturday and all day Sunday. My wrist has these sharp pains. I have a glove too. My shoulder is still bothering me. I have to go to [facility] for my knees on October 31st. I might ask them to  reevluate my right shoulder.    Pertinent History breast cancer with right  mastectomy with 20 lymph nodes removed in january 2017, chemo with peripheral neuropathy  and  had to have more skin removed before radiation radiation with skin disruption which was concluded last of October.  Past history includes multiple levels of degenerative disc disease in neck and spine and 4 knee surgeries on each knee with the last one in October 2016    Patient Stated Goals to get some range of motion in my right arm, tell me what I am doing wrong to cause this swelling in my chest    Currently in Pain? Yes   Pain Score 7    Pain Location Wrist   Pain Orientation Right   Pain Descriptors / Indicators Shooting;Sharp   Pain Type Acute pain   Pain Onset In the past 7 days   Pain Frequency Constant   Pain Relieving Factors wearing the sleeve                         [facility] Adult PT Treatment/Exercise - 06/29/17 0001      Manual Therapy   Manual Lymphatic Drainage (MLD) In Supine: Short neck, superficial and deep abdominals, Rt inguinal and Lt axillary nodes, Rt axillo-inguinal and anterior inter-axillary anstomosis then Rt UE from dorsal hand to lateral shoulder working from proximal to distal and retracing all steps, in left sidelying, posterior interaxillary anastomosis and right axillo-inguinal anastomosis   Neural Stretch Rt UE during P/ROM to tolerance                        Long Term Clinic Goals - 06/10/17 1356      CC Long Term Goal  #1   Title Patient with verbalize an understanding of lymphedema risk reduction precautions   Baseline 06/10/17- pt able to verbalize this independently   Time 4   Period Weeks   Status Achieved     CC Long Term Goal  #2   Title Patient will be independent in a home exercise program for continued strengthening and stretching   Baseline Pt states she is independent in strength after breast cancer program- pt reports she completes it  once a day   Time 4   Period Weeks   Status Partially Met     CC Long Term Goal  #3   Title Patient will improve right shoulder abduction to 130 degrees with no pain  so that she can perform dressing tasks indpendently    Baseline 92, 06/01/17- 122 degrees, 06/10/17- 146 degrees   Time 4   Period Weeks   Status Achieved     CC Long Term Goal  #4   Title Pt will demonstrate 130 degrees of right shoulder flexion to allow her to reach items overhead   Baseline 85, 06/01/17- 155 degrees   Time 4   Period Weeks   Status Achieved  CC Long Term Goal  #5   Title Pt will obtain appropriate compression bra for long term managment of lymphedema, and an appropriate compression sleeve for prophylactic use   Baseline 06/01/17- pt is getting measured for a compression bra on 06/10/17, sleeve ordered last week   Time 4   Period Weeks   Status On-going     CC Long Term Goal  #6   Title Pt will verbalize a 75% improvement in pain in right trunk to allow improved comfort   Time 4   Period Weeks   Target Date 07/08/17     Additional Goals   Additional Goals Yes            Plan - 06/29/17 1149    Clinical Impression Statement Pt's wrist was bothering her today. It is very sore and she states wearing her compression sleeve has helped. She was encouraged to continue to wear her compression sleeve during the day. Also educated pt to place chip pack in lateral bra to help decrease right truncal edema. Focused on MLD to RUE especially at wrist to help decrease swelling in this area and reduce pain. Continued with PROM to right shoulder which is also bothering patient.    Rehab Potential Good   Clinical Impairments Affecting Rehab Potential 20 lymph nodes removed, previous radiation    PT Frequency 2x / week   PT Duration 4 weeks   PT Treatment/Interventions ADLs/Self Care Home Management;Patient/family education;Manual techniques;Scar mobilization;Passive range of motion;Therapeutic  activities;Therapeutic exercise;Manual lymph drainage;Taping;Vasopneumatic Device   PT Next Visit Plan Continue myofascial work and soft tissue work  to right upper quadrant and axilla, MLD Rt lateral trunk and arm   PT Home Exercise Plan supine scap, strength ABC program   Consulted and Agree with Plan of Care Patient      Patient will benefit from skilled therapeutic intervention in order to improve the following deficits and impairments:  Decreased knowledge of precautions, Decreased scar mobility, Decreased strength, Pain, Impaired UE functional use, Increased fascial restricitons, Postural dysfunction, Decreased range of motion, Increased edema  Visit Diagnosis: Stiffness of right shoulder, not elsewhere classified  Acute pain of right shoulder  Postmastectomy lymphedema     Problem List Patient Active Problem List   Diagnosis Date Noted  . Insomnia 07/16/2016  . Left groin pain 11/30/2015  . Severe obesity (BMI >= 40) ([place]) 10/26/2015  . Malignant neoplasm of lower-inner quadrant of right breast of female, estrogen receptor positive ([place]) 08/08/2015  . Diabetes mellitus without complication ([place]) 23/53/6144  . Paraspinous mass, left 03/31/2013  . Internal hemorrhoid 02/17/2013  . Abdominal pain, other specified site 02/17/2013  . Neoplasm of soft tissue, left shoulder and left upper arm 08/03/2012  . Lightheadedness 01/29/2012  . Cholelithiasis with cholecystitis 10/08/2011  . Hypokalemia 09/17/2011  . GERD 08/08/2010  . GOUT 12/18/2008  . Essential hypertension 03/15/2007  . HEADACHE 03/15/2007    Allyson Sabal [facility] 06/29/2017, 11:51 AM  [place], [place], [address] Phone: [phone]   Fax:  [phone]  Name: [name] MRN: [id number] Date of Birth: June 12, 1961  Manus Gunning, PT 06/29/17 11:51 AM  PHYSICAL THERAPY DISCHARGE SUMMARY  Visits from Start of Care:  15  Current functional level related to goals / functional outcomes: See above   Remaining deficits: See above   Education / Equipment: See above  Plan: Patient agrees to discharge.  Patient goals were partially met. Patient is being discharged  due to not returning since the last visit.  ?????     [facility] [place], [place] 10/19/17 10:45 AM

## 2017-06-29 NOTE — ED Triage Notes (Signed)
Patient c/o right arm pain since today. Denies injury. Reports hx breast cancer, taking oral chemo. States a side effect of one of her medications is bone fractures. Swelling noted to right arm.

## 2017-06-30 ENCOUNTER — Ambulatory Visit (HOSPITAL_BASED_OUTPATIENT_CLINIC_OR_DEPARTMENT_OTHER): Payer: 59

## 2017-06-30 ENCOUNTER — Emergency Department (HOSPITAL_COMMUNITY)
Admission: EM | Admit: 2017-06-30 | Discharge: 2017-06-30 | Disposition: A | Discharge status: Home / Self Care | Payer: 59 | Attending: Emergency Medicine | Admitting: Emergency Medicine

## 2017-06-30 ENCOUNTER — Telehealth: Payer: Self-pay | Admitting: Medical

## 2017-06-30 ENCOUNTER — Telehealth: Payer: Self-pay

## 2017-06-30 ENCOUNTER — Ambulatory Visit (HOSPITAL_BASED_OUTPATIENT_CLINIC_OR_DEPARTMENT_OTHER): Payer: 59 | Admitting: Medical

## 2017-06-30 ENCOUNTER — Other Ambulatory Visit: Payer: Self-pay | Admitting: Medical

## 2017-06-30 VITALS — BP 157/75 | HR 77 | Temp 98.5°F | Resp 20 | Wt 247.5 lb

## 2017-06-30 DIAGNOSIS — Z170 Estrogen receptor positive status [ER+]: Secondary | ICD-10-CM

## 2017-06-30 DIAGNOSIS — Z79811 Long term (current) use of aromatase inhibitors: Secondary | ICD-10-CM | POA: Diagnosis not present

## 2017-06-30 DIAGNOSIS — M25539 Pain in unspecified wrist: Secondary | ICD-10-CM

## 2017-06-30 DIAGNOSIS — C50311 Malignant neoplasm of lower-inner quadrant of right female breast: Secondary | ICD-10-CM

## 2017-06-30 DIAGNOSIS — M654 Radial styloid tenosynovitis [de Quervain]: Secondary | ICD-10-CM

## 2017-06-30 DIAGNOSIS — I890 Lymphedema, not elsewhere classified: Secondary | ICD-10-CM | POA: Diagnosis not present

## 2017-06-30 LAB — COMPREHENSIVE METABOLIC PANEL
ALT: 12 U/L (ref 0–55)
AST: 17 U/L (ref 5–34)
Albumin: 4.1 g/dL (ref 3.5–5.0)
Alkaline Phosphatase: 79 U/L (ref 40–150)
Anion Gap: 10 mEq/L (ref 3–11)
BUN: 10.5 mg/dL (ref 7.0–26.0)
CO2: 30 mEq/L — ABNORMAL HIGH (ref 22–29)
Calcium: 9.8 mg/dL (ref 8.4–10.4)
Chloride: 105 mEq/L (ref 98–109)
Creatinine: 1 mg/dL (ref 0.6–1.1)
EGFR: 75 mL/min/{1.73_m2} — ABNORMAL LOW (ref >90)
Glucose: 108 mg/dl (ref 70–140)
Potassium: 4 mEq/L (ref 3.5–5.1)
Sodium: 145 mEq/L (ref 136–145)
Total Bilirubin: 0.23 mg/dL (ref 0.20–1.20)
Total Protein: 7.5 g/dL (ref 6.4–8.3)

## 2017-06-30 LAB — CBC WITH DIFFERENTIAL/PLATELET
BASO%: 0.3 % (ref 0.0–2.0)
Basophils Absolute: 0 10*3/uL (ref 0.0–0.1)
EOS%: 1.6 % (ref 0.0–7.0)
Eosinophils Absolute: 0.1 10*3/uL (ref 0.0–0.5)
HCT: 38.3 % (ref 34.8–46.6)
HGB: 12.7 g/dL (ref 11.6–15.9)
LYMPH%: 20.7 % (ref 14.0–49.7)
MCH: 29.8 pg (ref 25.1–34.0)
MCHC: 33.2 g/dL (ref 31.5–36.0)
MCV: 89.8 fL (ref 79.5–101.0)
MONO#: 0.4 10*3/uL (ref 0.1–0.9)
MONO%: 4.7 % (ref 0.0–14.0)
NEUT#: 5.9 10*3/uL (ref 1.5–6.5)
NEUT%: 72.7 % (ref 38.4–76.8)
Platelets: 300 10*3/uL (ref 145–400)
RBC: 4.27 10*6/uL (ref 3.70–5.45)
RDW: 13.9 % (ref 11.2–14.5)
WBC: 8.1 10*3/uL (ref 3.9–10.3)
lymph#: 1.7 10*3/uL (ref 0.9–3.3)

## 2017-06-30 LAB — URIC ACID: Uric Acid, Serum: 6.1 mg/dl (ref 2.6–7.4)

## 2017-06-30 NOTE — ED Notes (Signed)
Pt stated she did not want to wait to be seen by a doctor.  She will call the cancer center in the morning to follow up with her PCP.

## 2017-06-30 NOTE — Telephone Encounter (Signed)
Scheduled appts per 10/9 sch msg. Patient is aware of the appointments.

## 2017-06-30 NOTE — Patient Instructions (Signed)
De Quervain Tenosynovitis  Tendons attach muscles to bones. They also help with joint movements. When tendons become irritated or swollen, it is called tendinitis.  The extensor pollicis brevis (EPB) tendon connects the EPB muscle to a bone that is near the base of the thumb. The EPB muscle helps to straighten and extend the thumb. De Quervain tenosynovitis is a condition in which the EPB tendon lining (sheath) becomes irritated, thickened, and swollen. This condition is sometimes called stenosing tenosynovitis. This condition causes pain on the thumb side of the back of the wrist.  What are the causes?  Causes of this condition include:   Activities that repeatedly cause your thumb and wrist to extend.   A sudden increase in activity or change in activity that affects your wrist.    What increases the risk?  This condition is more likely to develop in:   Females.   People who have diabetes.   Women who have recently given birth.   People who are over 40 years of age.   People who do activities that involve repeated hand and wrist motions, such as tennis, racquetball, volleyball, gardening, and taking care of children.   People who do heavy labor.   People who have poor wrist strength and flexibility.   People who do not warm up properly before activities.    What are the signs or symptoms?  Symptoms of this condition include:   Pain or tenderness over the thumb side of the back of the wrist when your thumb and wrist are not moving.   Pain that gets worse when you straighten your thumb or extend your thumb or wrist.   Pain when the injured area is touched.   Locking or catching of the thumb joint while you bend and straighten your thumb.   Decreased thumb motion due to pain.   Swelling over the affected area.    How is this diagnosed?  This condition is diagnosed with a medical history and physical exam. Your health care provider will ask for details about your injury and ask about your  symptoms.  How is this treated?  Treatment may include the use of icing and medicines to reduce pain and swelling. You may also be advised to wear a splint or brace to limit your thumb and wrist motion. In less severe cases, treatment may also include working with a physical therapist to strengthen your wrist and calm the irritation around your EPB tendon sheath. In severe cases, surgery may be needed.  Follow these instructions at home:  If you have a splint or brace:   Wear it as told by your health care provider. Remove it only as told by your health care provider.   Loosen the splint or brace if your fingers become numb and tingle, or if they turn cold and blue.   Keep the splint or brace clean and dry.  Managing pain, stiffness, and swelling   If directed, apply ice to the injured area.  ? Put ice in a plastic bag.  ? Place a towel between your skin and the bag.  ? Leave the ice on for 20 minutes, 2-3 times per day.   Move your fingers often to avoid stiffness and to lessen swelling.   Raise (elevate) the injured area above the level of your heart while you are sitting or lying down.  General instructions   Return to your normal activities as told by your health care provider. Ask your health care provider   what activities are safe for you.   Take over-the-counter and prescription medicines only as told by your health care provider.   Keep all follow-up visits as told by your health care provider. This is important.   Do not drive or operate heavy machinery while taking prescription pain medicine.  Contact a health care provider if:   Your pain, tenderness, or swelling gets worse, even if you have had treatment.   You have numbness or tingling in your wrist, hand, or fingers on the injured side.  This information is not intended to replace advice given to you by your health care provider. Make sure you discuss any questions you have with your health care provider.  Document Released: 09/08/2005  Document Revised: 02/14/2016 Document Reviewed: 11/14/2014  [employer]vier Interactive Patient Education  2018 [employer]

## 2017-06-30 NOTE — Telephone Encounter (Signed)
Pt is on anastrozole. She is getting lymphedema treatment. Last night her R wrist swelled more than normal and is extremely tender. She went to ED and got x-ray of arm. The wait was too long and she left. The right wrist is swollen, she can barely close her hand. She denies redness nor increased warmth. She is requesting to be seen. S/w Val and will set up with [facility].  Cbc, cmet ordered. inbasket to scheduler-no time frame given

## 2017-06-30 NOTE — Progress Notes (Signed)
[facility] Progress Note   [name] [id number] May 12, 1961 56 y.o.  [name] is managed by Dr. Tressa Busman  Actively treated with chemotherapy: no  Current Therapy: Anastrozole  Assessment: Plan:    Radial styloid tenosynovitis (de quervain): The patient was given a sheet of exercises to begin. She was instructed to obtain a splint to immobilize her right wrist when not exercising. She will call if no better and will be referred to orthopedics for consideration of a steroid injection.  Please see After Visit Summary for patient specific instructions.  Future Appointments Date Time Provider [place]  07/01/2017 1:00 PM [place], [place] L, [place] [facility] None  08/10/2017 11:00 AM [facility] None  08/10/2017 11:30 AM [name], Virgie Dad, MD [facility] None    No orders of the defined types were placed in this encounter.     Subjective:   Patient ID:  [name] is a 55 y.o. (DOB 1960/10/24) female.  Chief Complaint: No chief complaint on file.   HPI [name] is a 56 year old female with a history of a multicentric ER positive breast cancer who was last seen on 01/12/2017. The patient continues on anastrozole. The patient is currently undergoing lymphedema treatment. Last evening she noted that her local right wrist was swelling more than normal and was extremely tender. She went to the emergency room at [facility] and got an x-ray of her arm. The x-ray of the right forearm returned showing no acute abnormalities. She stated that the wait was too long in the emergency room last evening when she called our office this morning. She reported that her right wrist continued to be swollen to the point where she was unable to close her hand. She denied any erythema or increased warmth.   Medications: I have reviewed the patient's current medications.  Allergies:  Allergies  Allergen Reactions  . Codeine  Itching and Nausea And Vomiting    Itching all over the body  . Latex Hives  . Sulfonamide Derivatives Hives    All over the body    Past Medical History:  Diagnosis Date  . Breast cancer ([place])   . Breast cancer of lower-inner quadrant of right female breast ([place]) 08/08/2015  . Colon polyps   . DDD (degenerative disc disease), cervical   . Degenerative joint disease   . Diabetes mellitus without complication ([place])   . Diverticulosis   . Gallstones   . GERD (gastroesophageal reflux disease)   . Headache(784.0)    migraine like per patient  . Hypertension   . Low back pain    dr phillips, pain management  . Osteoarthritis     Past Surgical History:  Procedure Laterality Date  . CHOLECYSTECTOMY  10/28/2011   Procedure: LAPAROSCOPIC CHOLECYSTECTOMY WITH INTRAOPERATIVE CHOLANGIOGRAM;  Surgeon: Earnstine Regal, MD;  Location: [facility];  Service: General;  Laterality: N/A;  . COLONOSCOPY WITH ESOPHAGOGASTRODUODENOSCOPY (EGD)  02/03/2013   with Propofol  . EXAM UNDER ANESTHESIA WITH MANIPULATION OF KNEE Right 05/30/2003  . EXAM UNDER ANESTHESIA WITH MANIPULATION OF KNEE Left 12/27/2002  . GANGLION CYST EXCISION Right    right wrist  . KNEE ARTHROSCOPY Bilateral 01/02/2000  . KNEE ARTHROSCOPY Right   . LAPAROSCOPIC VAGINAL HYSTERECTOMY WITH SALPINGO OOPHORECTOMY Bilateral 07/13/2006  . LIPOMA EXCISION Right 02/21/2008   hip  . MASS EXCISION  08/24/2012   Procedure: EXCISION MASS;  Surgeon: Earnstine Regal, MD;  Location: [place];  Service: General;  Laterality: Left;  excisie soft tissue masses left shoulder(back) & left upper arm  . MASS EXCISION Left 01/24/2014   Procedure: EXCISION SOFT TISSUE MASSES LOWER LEFT BACK;  Surgeon: Earnstine Regal, MD;  Location: [place];  Service: General;  Laterality: Left;  Marland Kitchen MASS EXCISION Right 04/30/2016   Procedure: EXCISION OF SKIN RIGHT CHEST WALL;  Surgeon: Autumn Messing III, MD;  Location: [place];   Service: General;  Laterality: Right;  EXCISION OF SKIN RIGHT CHEST WALL  . MASTECTOMY W/ SENTINEL NODE BIOPSY Right 10/01/2015   Procedure: RIGHT MASTECTOMY WITH SENTINEL LYMPH NODE BIOPSY;  Surgeon: Autumn Messing III, MD;  Location: [place];  Service: General;  Laterality: Right;  . PORT-A-CATH REMOVAL Left 04/30/2016   Procedure: REMOVAL PORT-A-CATH;  Surgeon: Autumn Messing III, MD;  Location: [place];  Service: General;  Laterality: Left;  REMOVAL PORT-A-CATH  . PORTACATH PLACEMENT Left 11/01/2015   Procedure: INSERTION PORT-A-CATH;  Surgeon: Autumn Messing III, MD;  Location: [place];  Service: General;  Laterality: Left;  . REPLACEMENT UNICONDYLAR JOINT KNEE Left 04/20/2001  . REVISION TOTAL KNEE ARTHROPLASTY Right 06/18/2004  . REVISION TOTAL KNEE ARTHROPLASTY Left 11/15/2002  . REVISION TOTAL KNEE ARTHROPLASTY Left 10/12/2001  . REVISION TOTAL KNEE ARTHROPLASTY Right 07/09/2015  . TONSILLECTOMY    . TOTAL KNEE ARTHROPLASTY Right 04/25/2003  . TOTAL KNEE ARTHROPLASTY Left 06/25/2009    Family History  Problem Relation Age of Onset  . Stomach cancer Mother   . Heart disease Father   . Prostate cancer Father   . Kidney cancer Sister        one out of the four  . Hypertension Sister   . Hypertension Brother   . Crohn's disease Other        nephew  . Colon cancer Maternal Grandmother     Social History   Social History  . Marital status: Married    Spouse name: N/A  . Number of children: 3  . Years of education: N/A   Occupational History  . retired/disabled    Social History Main Topics  . Smoking status: Current Every Day Smoker    Packs/day: 0.25    Years: 20.00    Types: Cigarettes  . Smokeless tobacco: Former Systems developer    Quit date: 09/30/2015     Comment: smokes about 4 cigs/day; working on quitting - 08/22/16 gwd  . Alcohol use No  . Drug use: No  . Sexual activity: Yes    Birth control/ protection: Post-menopausal   Other Topics Concern  . Not  on file   Social History Narrative  . No narrative on file    Past Medical History, Surgical history, Social history, and Family history were reviewed and updated as appropriate.   Please see review of systems for further details on the patient's review from today.   Review of Systems:  Review of Systems  Constitutional: Negative for activity change, chills, diaphoresis and fever.  Musculoskeletal: Positive for arthralgias and joint swelling.    Objective:   Physical Exam:  BP (!) 157/75 (BP Location: Right Arm, Patient Position: Sitting)   Pulse 77   Temp 98.5 F (36.9 C) (Oral)   Resp 20   Wt 247 lb 8 oz (112.3 kg)   SpO2 100%   BMI 38.76 kg/m  ECOG: 0  Physical Exam  Constitutional: No distress.  HENT:  Head: Normocephalic and atraumatic.  Musculoskeletal: She exhibits edema (Diffuse  edema of the right wrist.) and tenderness (Tenderness over the styloid process of the right wrist.). She exhibits no deformity.  Positive Finkelstein sign of the right wrist.  Neurological: She is alert. Coordination normal.  Skin: Skin is warm and dry. She is not diaphoretic.  Psychiatric: She has a normal mood and affect. Her behavior is normal. Judgment and thought content normal.    Lab Review:     Component Value Date/Time   NA 145 06/30/2017 1419   K 4.0 06/30/2017 1419   CL 103 03/23/2017 0111   CO2 30 (H) 06/30/2017 1419   GLUCOSE 108 06/30/2017 1419   BUN 10.5 06/30/2017 1419   CREATININE 1.0 06/30/2017 1419   CALCIUM 9.8 06/30/2017 1419   PROT 7.5 06/30/2017 1419   ALBUMIN 4.1 06/30/2017 1419   AST 17 06/30/2017 1419   ALT 12 06/30/2017 1419   ALKPHOS 79 06/30/2017 1419   BILITOT 0.23 06/30/2017 1419   GFRNONAA >60 03/23/2017 0111   GFRAA >60 03/23/2017 0111       Component Value Date/Time   WBC 8.1 06/30/2017 1419   WBC 8.8 03/23/2017 0111   RBC 4.27 06/30/2017 1419   RBC 3.89 03/23/2017 0111   HGB 12.7 06/30/2017 1419   HCT 38.3 06/30/2017 1419   PLT  300 06/30/2017 1419   MCV 89.8 06/30/2017 1419   MCH 29.8 06/30/2017 1419   MCH 29.6 03/23/2017 0111   MCHC 33.2 06/30/2017 1419   MCHC 33.8 03/23/2017 0111   RDW 13.9 06/30/2017 1419   LYMPHSABS 1.7 06/30/2017 1419   MONOABS 0.4 06/30/2017 1419   EOSABS 0.1 06/30/2017 1419   BASOSABS 0.0 06/30/2017 1419   -------------------------------  Imaging from last 24 hours (if applicable):  Radiology interpretation: Dg Forearm Right  Result Date: 06/29/2017 CLINICAL DATA:  Right forearm pain for 1 week. EXAM: RIGHT FOREARM - 2 VIEW COMPARISON:  None. FINDINGS: Negative for a fracture involving the radius or ulna. Normal alignment at the right wrist. Normal alignment of the right elbow without a definite elbow joint effusion. Mild spurring and degenerative changes at the coronoid process of the ulna. IMPRESSION: No acute abnormality. Electronically Signed   By: Markus Daft M.D.   On: 06/29/2017 22:57

## 2017-07-01 ENCOUNTER — Other Ambulatory Visit: Payer: Self-pay | Admitting: Family Medicine

## 2017-07-01 ENCOUNTER — Ambulatory Visit: Payer: 59 | Admitting: Physical Therapy

## 2017-07-01 ENCOUNTER — Telehealth: Payer: Self-pay | Admitting: Medical

## 2017-07-01 NOTE — Telephone Encounter (Signed)
Per 10/9 - no los at checkout °

## 2017-07-01 NOTE — Telephone Encounter (Signed)
Can we refill this? 

## 2017-07-06 ENCOUNTER — Other Ambulatory Visit: Payer: Self-pay | Admitting: Family Medicine

## 2017-07-10 ENCOUNTER — Other Ambulatory Visit: Payer: Self-pay | Admitting: Oncology

## 2017-07-14 ENCOUNTER — Ambulatory Visit: Payer: 59

## 2017-07-15 ENCOUNTER — Encounter: Payer: Self-pay | Admitting: Family Medicine

## 2017-07-15 ENCOUNTER — Ambulatory Visit (INDEPENDENT_AMBULATORY_CARE_PROVIDER_SITE_OTHER): Payer: 59 | Admitting: Family Medicine

## 2017-07-15 VITALS — Temp 98.9°F | Ht 67.0 in | Wt 247.0 lb

## 2017-07-15 DIAGNOSIS — D179 Benign lipomatous neoplasm, unspecified: Secondary | ICD-10-CM | POA: Diagnosis not present

## 2017-07-15 DIAGNOSIS — Z23 Encounter for immunization: Secondary | ICD-10-CM | POA: Diagnosis not present

## 2017-07-15 NOTE — Progress Notes (Signed)
   Subjective:    Patient ID: [name], female    DOB: 1961-07-19, 56 y.o.   MRN: [id number]  HPI Here asking to check several small lumps she has felt on the abdomen over the past 3 months. They do not seem to be growing. At times they can be a little tender but not all the time. She has a hx of multiple lipomas, and she has had several removed.    Review of Systems  Constitutional: Negative.   Respiratory: Negative.   Cardiovascular: Negative.   Gastrointestinal: Negative.   Neurological: Negative.        Objective:   Physical Exam  Constitutional: She appears well-developed and well-nourished.  Cardiovascular: Normal rate, regular rhythm, normal heart sounds and intact distal pulses.   Pulmonary/Chest: Effort normal and breath sounds normal. No respiratory distress. She has no wheezes. She has no rales.  Abdominal: Soft. Bowel sounds are normal. She exhibits no distension. There is no rebound and no guarding.  She has 3 small firm mobile masses under the skin of the abdomen. The largest one is just superior to the umbilicus, and this one is slightly tender           Assessment & Plan:  These are benign lipomas. She will observe for now and return if needed.  Alysia Penna, MD

## 2017-07-15 NOTE — Patient Instructions (Signed)
WE NOW OFFER   [facility] FAST TRACK!!!  SAME DAY Appointments for ACUTE CARE  Such as: Sprains, Injuries, cuts, abrasions, rashes, muscle pain, joint pain, back pain Colds, flu, sore throats, headache, allergies, cough, fever  Ear pain, sinus and eye infections Abdominal pain, nausea, vomiting, diarrhea, upset stomach Animal/insect bites  3 Easy Ways to Schedule: Walk-In Scheduling Call in scheduling Mychart Sign-up: [email or web address]         

## 2017-07-15 NOTE — Addendum Note (Signed)
Addended by: Aggie Hacker A on: 07/15/2017 12:17 PM   Modules accepted: Orders

## 2017-07-22 DIAGNOSIS — Z96653 Presence of artificial knee joint, bilateral: Secondary | ICD-10-CM | POA: Diagnosis not present

## 2017-07-22 DIAGNOSIS — Z96651 Presence of right artificial knee joint: Secondary | ICD-10-CM | POA: Diagnosis not present

## 2017-07-22 DIAGNOSIS — M25531 Pain in right wrist: Secondary | ICD-10-CM | POA: Diagnosis not present

## 2017-07-22 DIAGNOSIS — M25561 Pain in right knee: Secondary | ICD-10-CM | POA: Diagnosis not present

## 2017-07-29 ENCOUNTER — Other Ambulatory Visit: Payer: Self-pay

## 2017-07-29 DIAGNOSIS — D492 Neoplasm of unspecified behavior of bone, soft tissue, and skin: Secondary | ICD-10-CM

## 2017-07-30 ENCOUNTER — Ambulatory Visit (HOSPITAL_BASED_OUTPATIENT_CLINIC_OR_DEPARTMENT_OTHER): Payer: 59 | Admitting: Oncology

## 2017-07-30 ENCOUNTER — Other Ambulatory Visit (HOSPITAL_BASED_OUTPATIENT_CLINIC_OR_DEPARTMENT_OTHER): Payer: 59

## 2017-07-30 VITALS — BP 160/77 | HR 77 | Temp 98.2°F | Resp 18 | Ht 67.0 in | Wt 247.4 lb

## 2017-07-30 DIAGNOSIS — Z170 Estrogen receptor positive status [ER+]: Secondary | ICD-10-CM

## 2017-07-30 DIAGNOSIS — Z79811 Long term (current) use of aromatase inhibitors: Secondary | ICD-10-CM | POA: Diagnosis not present

## 2017-07-30 DIAGNOSIS — C50311 Malignant neoplasm of lower-inner quadrant of right female breast: Secondary | ICD-10-CM

## 2017-07-30 DIAGNOSIS — N951 Menopausal and female climacteric states: Secondary | ICD-10-CM

## 2017-07-30 DIAGNOSIS — D492 Neoplasm of unspecified behavior of bone, soft tissue, and skin: Secondary | ICD-10-CM

## 2017-07-30 DIAGNOSIS — N898 Other specified noninflammatory disorders of vagina: Secondary | ICD-10-CM | POA: Diagnosis not present

## 2017-07-30 DIAGNOSIS — M1990 Unspecified osteoarthritis, unspecified site: Secondary | ICD-10-CM

## 2017-07-30 LAB — COMPREHENSIVE METABOLIC PANEL
ALT: 13 U/L (ref 0–55)
AST: 17 U/L (ref 5–34)
Albumin: 4.1 g/dL (ref 3.5–5.0)
Alkaline Phosphatase: 76 U/L (ref 40–150)
Anion Gap: 14 mEq/L — ABNORMAL HIGH (ref 3–11)
BUN: 15.6 mg/dL (ref 7.0–26.0)
CO2: 23 mEq/L (ref 22–29)
Calcium: 9.6 mg/dL (ref 8.4–10.4)
Chloride: 102 mEq/L (ref 98–109)
Creatinine: 0.9 mg/dL (ref 0.6–1.1)
EGFR: >60 mL/min/{1.73_m2} (ref >60)
Glucose: 116 mg/dl (ref 70–140)
Potassium: 3.3 mEq/L — ABNORMAL LOW (ref 3.5–5.1)
Sodium: 140 mEq/L (ref 136–145)
Total Bilirubin: 0.25 mg/dL (ref 0.20–1.20)
Total Protein: 7.4 g/dL (ref 6.4–8.3)

## 2017-07-30 LAB — DRAW EXTRA CLOT TUBE

## 2017-07-30 LAB — CBC WITH DIFFERENTIAL/PLATELET
BASO%: 0.1 % (ref 0.0–2.0)
Basophils Absolute: 0 10*3/uL (ref 0.0–0.1)
EOS%: 1.5 % (ref 0.0–7.0)
Eosinophils Absolute: 0.1 10*3/uL (ref 0.0–0.5)
HCT: 38.3 % (ref 34.8–46.6)
HGB: 12.7 g/dL (ref 11.6–15.9)
LYMPH%: 36.1 % (ref 14.0–49.7)
MCH: 29.9 pg (ref 25.1–34.0)
MCHC: 33.2 g/dL (ref 31.5–36.0)
MCV: 90.1 fL (ref 79.5–101.0)
MONO#: 0.3 10*3/uL (ref 0.1–0.9)
MONO%: 4.6 % (ref 0.0–14.0)
NEUT#: 3.9 10*3/uL (ref 1.5–6.5)
NEUT%: 57.7 % (ref 38.4–76.8)
Platelets: 258 10*3/uL (ref 145–400)
RBC: 4.25 10*6/uL (ref 3.70–5.45)
RDW: 13.7 % (ref 11.2–14.5)
WBC: 6.8 10*3/uL (ref 3.9–10.3)
lymph#: 2.4 10*3/uL (ref 0.9–3.3)

## 2017-07-30 MED ORDER — ANASTROZOLE 1 MG PO TABS: ORAL_TABLET | ORAL | 4 refills | Status: DC

## 2017-07-30 MED ORDER — GABAPENTIN 300 MG PO CAPS: 300.0000 mg | ORAL_CAPSULE | Freq: Every day | ORAL | 4 refills | Status: DC

## 2017-07-30 NOTE — Progress Notes (Signed)
[place]  [phone] [phone]   ID: LEANNA HAMID DOB: 1961-03-10  MR#: [id number]  YIR#:[id number]  Patient Care Team: Laurey Morale, MD as PCP - General Jovita Kussmaul, MD as Consulting Physician (General Surgery) Kani Chauvin, Virgie Dad, MD as Consulting Physician (Oncology) Tyler Pita, MD as Consulting Physician (Radiation Oncology) Nicholaus Bloom, MD (Anesthesiology) Delice Bison Charlestine Massed, NP as Nurse Practitioner (Hematology and Oncology) PCP: Laurey Morale, MD GYN: OTHER MD: Mardi Mainland MD, Nicholaus Bloom MD  CHIEF COMPLAINT: Multicentric estrogen receptor positive breast cancer  CURRENT TREATMENT:  Anastrozole  BREAST CANCER HISTORY: From the original intake note:  Ia herself noted a change in her right breast in October and brought it to the attention of her primary care physician. Her last mammogram had been April 2010. Dr. Sarajane Jews set [name] up for bilateral diagnostic mammography with tomosynthesis and [facility] 04/11/2015. This found the breast density to be category B. In the right breast there were 3 microlobulated masses in the lower inner quadrant measuring 1.9, 1.5, and 1.5 cm. These were multicentric. There were also diffuse fine pleomorphic calcifications surrounding these masses, that area spanning 11.6 cm. The masses were palpable by exam at 4:00 8 cm from the nipple and at 5:00 3 cm from the nipple. By ultrasonography there was an irregular hypoechoic mass in the right breast at 4:00 measuring 1.7 cm, 1 medial to this measuring 1.1 cm, and then the third irregular hypoechoic mass at the 5:00 position measuring 1.3 cm. The right axilla showed multiple lymph nodes with thickened cortices. The largest lymph node measured 0.9 cm.  On 08/02/2015, [name] underwent biopsy of all 3 breast masses as well as a right axillary lymph node. 2 of the tumors were similar invasive  ductal carcinomas, grade 2, estrogen receptor 95% positive, progesterone receptor 5% positive, with an MIB-1 of 10%, and HER-2 equivocal, with a signals ratio of 1.30 and the number per cell 4.0. The third tumor appeared's grade 1 or 2 was also estrogen receptor positive at 95%, progesterone receptor positive at 5%, with an MIP-1 of 5%, and a similar HER-2 profile the signals ratio being 1.46 but the number per cell being only 3.95. By immunohistochemistry on this tumor HER-2 was negative at 1+. Immunohistochemistry on the equivocal reading is pending.  Tarissa's subsequent history is as detailed below  INTERVAL HISTORY: Leanna returns today for follow-up and treatment of her estrogen receptor positive breast cancer.  She continues on anastrozole. She reports that she has severe hot flashes and vaginal dryness. She is interested in taking medication for her hot flashes. She has been using coconut oil for her vaginal dryness and she is interested pelvic health classes. She also experiences arthralgias. She has arthritis and sees specialist for her pain management.    REVIEW OF SYSTEMS: Deyci reports that she will walk outside for about 2 miles. She also adds she is always busy and always moving around. She has lost some weight.  She denies unusual headaches, visual changes, nausea, vomiting, or dizziness. There has been no unusual cough, phlegm production, or pleurisy. This been no change in bowel or bladder habits. She denies unexplained fatigue, bleeding, rash, or fever. A detailed review of systems was otherwise entirely stable.  PAST MEDICAL HISTORY: Past Medical History:  Diagnosis Date  . Breast cancer ([place])   . Breast cancer of lower-inner quadrant of right female breast ([place]) 08/08/2015  . Colon polyps   .  DDD (degenerative disc disease), cervical   . Degenerative joint disease   . Diabetes mellitus without complication ([place])   . Diverticulosis   . Gallstones   . GERD (gastroesophageal  reflux disease)   . Headache(784.0)    migraine like per patient  . Hypertension   . Low back pain    dr phillips, pain management  . Osteoarthritis     PAST SURGICAL HISTORY: Past Surgical History:  Procedure Laterality Date  . COLONOSCOPY WITH ESOPHAGOGASTRODUODENOSCOPY (EGD)  02/03/2013   with Propofol  . EXAM UNDER ANESTHESIA WITH MANIPULATION OF KNEE Right 05/30/2003  . EXAM UNDER ANESTHESIA WITH MANIPULATION OF KNEE Left 12/27/2002  . GANGLION CYST EXCISION Right    right wrist  . KNEE ARTHROSCOPY Bilateral 01/02/2000  . KNEE ARTHROSCOPY Right   . LAPAROSCOPIC VAGINAL HYSTERECTOMY WITH SALPINGO OOPHORECTOMY Bilateral 07/13/2006  . LIPOMA EXCISION Right 02/21/2008   hip  . REPLACEMENT UNICONDYLAR JOINT KNEE Left 04/20/2001  . REVISION TOTAL KNEE ARTHROPLASTY Right 06/18/2004  . REVISION TOTAL KNEE ARTHROPLASTY Left 11/15/2002  . REVISION TOTAL KNEE ARTHROPLASTY Left 10/12/2001  . REVISION TOTAL KNEE ARTHROPLASTY Right 07/09/2015  . TONSILLECTOMY    . TOTAL KNEE ARTHROPLASTY Right 04/25/2003  . TOTAL KNEE ARTHROPLASTY Left 06/25/2009    FAMILY HISTORY Family History  Problem Relation Age of Onset  . Stomach cancer Mother   . Heart disease Father   . Prostate cancer Father   . Kidney cancer Sister        one out of the four  . Hypertension Sister   . Hypertension Brother   . Crohn's disease Other        nephew  . Colon cancer Maternal Grandmother   Ms. Tisby is a real long radiation since she was coming to see me today if she wanted to she could drop by downstairs would see her and she would like to review them does let them know that she is originally The patient's father died at the age of 60 with congestive heart failure. Her mother is still living as of November 2016, age 24. The patient had 6 brothers, 4 sisters. One sister was diagnosed with kidney cancer at age 65. ([place]). She is doing well. The patient's maternal grandmother was diagnosed with colon cancer  at the age of 13. The patient's mother was diagnosed with a "rare stomach cancer" 20 years ago. The patient's father was diagnosed with prostate cancer at age 84. There is no history of breast or ovarian cancer in the family to her knowledge.   GYNECOLOGIC HISTORY:  No LMP recorded. Patient has had a hysterectomy.  menarche age 74, first live birth age 31, the patient is GX P3. She underwent remote hysterectomy with bilateral salpingo-oophorectomy. She did not use hormone replacement. She never used oral contraceptives.   SOCIAL HISTORY:  Dealie used to work at [employer] but she is now disabled because of her multiple arthritic and degenerative problems. Her husband Elberta Fortis A. Lauderbaugh works as a Freight forwarder. Son Dalphine Handing works in long care in [place]. Son Evette Doffing works for a Arboriculturist in [place]. Daughter  Silva Bandy is a Haematologist in [place]. The patient has 5 grandchildren. She attends a Physicist, medical church    ADVANCED DIRECTIVES: Not in place   HEALTH MAINTENANCE: Social History   Tobacco Use  . Smoking status: Current Every Day Smoker    Packs/day: 0.25    Years: 20.00    Pack years: 5.00  Types: Cigarettes  . Smokeless tobacco: Former Systems developer    Quit date: 09/30/2015  . Tobacco comment: smokes about 4 cigs/day; working on quitting - 08/22/16 gwd  Substance Use Topics  . Alcohol use: No    Alcohol/week: 0.0 oz  . Drug use: No     Colonoscopy:February 2016   WNU:UVOZDG post hysterectomy   Bone density:  Lipid panel:  Allergies  Allergen Reactions  . Codeine Itching and Nausea And Vomiting    Itching all over the body  . Latex Hives  . Sulfonamide Derivatives Hives    All over the body    Current Outpatient Medications  Medication Sig Dispense Refill  . amitriptyline (ELAVIL) 25 MG tablet Take 25 mg by mouth at bedtime.     Marland Kitchen amLODipine (NORVASC) 5 MG tablet TAKE 1 TABLET(5 MG) BY MOUTH TWICE DAILY 180 tablet 3    . anastrozole (ARIMIDEX) 1 MG tablet TAKE 1 TABLET(1 MG) BY MOUTH DAILY 30 tablet 0  . lisinopril (PRINIVIL,ZESTRIL) 10 MG tablet TAKE 1 TABLET(10 MG) BY MOUTH DAILY 90 tablet 3  . metFORMIN (GLUCOPHAGE) 500 MG tablet TAKE 1 TABLET(500 MG) BY MOUTH TWICE DAILY WITH A MEAL 180 tablet 3  . methadone (DOLOPHINE) 10 MG tablet Take 10 mg by mouth every 8 (eight) hours as needed for moderate pain.     . metoprolol (LOPRESSOR) 50 MG tablet TAKE 1 TABLET BY MOUTH TWICE DAILY (Patient taking differently: TAKE 50 MG BY MOUTH TWICE DAILY) 180 tablet 1  . omeprazole (PRILOSEC) 40 MG capsule TAKE 1 CAPSULE(40 MG) BY MOUTH DAILY (Patient taking differently: TAKE 1 CAPSULE(40 MG) BY MOUTH EACH MORNING) 90 capsule 1  . ondansetron (ZOFRAN) 8 MG tablet Take 0.5-1 tablets (4-8 mg total) by mouth every 8 (eight) hours as needed for nausea or vomiting. 20 tablet 0  . ONE TOUCH ULTRA TEST test strip USE TO TEST ONCE DAILY 100 each 1  . ONETOUCH DELICA LANCETS FINE MISC USE TO TEST ONCE DAILY 100 each 0  . oxyCODONE (ROXICODONE) 15 MG immediate release tablet Take 15 mg by mouth every 6 (six) hours as needed for pain. Reported on 12/10/2015    . potassium chloride (K-DUR,KLOR-CON) 10 MEQ tablet TAKE 2 TABLETS(20 MEQ) BY MOUTH TWICE DAILY 360 tablet 3  . zolpidem (AMBIEN) 10 MG tablet Take 1 tablet (10 mg total) by mouth at bedtime as needed. for sleep 30 tablet 5   No current facility-administered medications for this visit.     OBJECTIVE: Middle-aged African-American woman who appears stated age 1:   07/30/17 1146  BP: (!) 160/77  Pulse: 77  Resp: 18  Temp: 98.2 F (36.8 C)  SpO2: 94%     Body mass index is 38.75 kg/m.    ECOG FS:1 - Symptomatic but completely ambulatory   Sclerae unicteric, EOMs intact Oropharynx clear and moist No cervical or supraclavicular adenopathy Lungs no rales or rhonchi Heart regular rate and rhythm Abd soft, nontender, positive bowel sounds MSK no focal spinal  tenderness, no upper extremity lymphedema Neuro: nonfocal, well oriented, appropriate affect Breasts: The right breast is status post mastectomy and radiation.  There is still hyperpigmentation but no evidence of local recurrence.  The left breast is benign.  Both axillae are benign.    LAB RESULTS:  CMP     Component Value Date/Time   NA 145 06/30/2017 1419   K 4.0 06/30/2017 1419   CL 103 03/23/2017 0111   CO2 30 (H) 06/30/2017 1419   GLUCOSE  108 06/30/2017 1419   BUN 10.5 06/30/2017 1419   CREATININE 1.0 06/30/2017 1419   CALCIUM 9.8 06/30/2017 1419   PROT 7.5 06/30/2017 1419   ALBUMIN 4.1 06/30/2017 1419   AST 17 06/30/2017 1419   ALT 12 06/30/2017 1419   ALKPHOS 79 06/30/2017 1419   BILITOT 0.23 06/30/2017 1419   GFRNONAA >60 03/23/2017 0111   GFRAA >60 03/23/2017 0111    INo results found for: SPEP, UPEP  Lab Results  Component Value Date   WBC 6.8 07/30/2017   NEUTROABS 3.9 07/30/2017   HGB 12.7 07/30/2017   HCT 38.3 07/30/2017   MCV 90.1 07/30/2017   PLT 258 07/30/2017      Chemistry      Component Value Date/Time   NA 145 06/30/2017 1419   K 4.0 06/30/2017 1419   CL 103 03/23/2017 0111   CO2 30 (H) 06/30/2017 1419   BUN 10.5 06/30/2017 1419   CREATININE 1.0 06/30/2017 1419      Component Value Date/Time   CALCIUM 9.8 06/30/2017 1419   ALKPHOS 79 06/30/2017 1419   AST 17 06/30/2017 1419   ALT 12 06/30/2017 1419   BILITOT 0.23 06/30/2017 1419       No results found for: LABCA2  No components found for: LABCA125  No results for input(s): INR in the last 168 hours.  Urinalysis    Component Value Date/Time   COLORURINE YELLOW 03/22/2017 2225   APPEARANCEUR CLOUDY (A) 03/22/2017 2225   LABSPEC 1.019 03/22/2017 2225   LABSPEC 1.010 02/19/2016 1355   PHURINE 5.0 03/22/2017 2225   GLUCOSEU NEGATIVE 03/22/2017 2225   GLUCOSEU Negative 02/19/2016 1355   HGBUR LARGE (A) 03/22/2017 2225   HGBUR large 07/01/2010 0945   BILIRUBINUR NEGATIVE  03/22/2017 2225   BILIRUBINUR Negative 02/19/2016 1355   KETONESUR NEGATIVE 03/22/2017 2225   PROTEINUR 100 (A) 03/22/2017 2225   UROBILINOGEN 0.2 02/19/2016 1355   NITRITE NEGATIVE 03/22/2017 2225   LEUKOCYTESUR LARGE (A) 03/22/2017 2225   LEUKOCYTESUR Small 02/19/2016 1355    STUDIES: Unilateral left mammography October 09, 2016 was unremarkable  ASSESSMENT: 56 y.o. [place] woman status post right breast lower inner quadrant biopsy 3 and axillary lymph node biopsy 08/02/2015 for a clinical mpT1c N0, stage IA invasive ductal carcinoma, grade 1 or 2, estrogen receptor 95% positive, progesterone receptor 5% positive, with an MIB-1 between 5 and 10%, and HER-2 equivocal on 1 of the 2 biopsies (signals ratio 1.30, number per cell 4.0).  (1) status post right modified radical mastectomy 10/01/2015 for an mpT2 pN0, stage IIA invasive ductal carcinoma, grade 2, with a total of 20 benign lymph nodes removed  (2) Oncotype DX score of 32 predicts an outside the breast recurrence risk of 22% within 10 years if the patient's only systemic therapy is tamoxifen for 5 years. It also predicts significant benefit from adjuvant chemotherapy.  (3) patient completed adjuvant doxorubicin and cyclophosphamide in dose dense fashion 4, last dose 12/24/2015, followed by paclitaxel weekly starting 01/07/2016  (a) completed 10 of 12 planned paclitaxel doses, final 2 doses omitted because of neuropathy concerns    (4) postmastectomy radiation 06/02/16-07/17/16 1.  The right chest wall and supraclavicular region was treated to 50.4 Gy in 28 fractions of 1.8 Gy 2.  The mastectomy site was boosted to 60.4 Gy with 5 fractions of 2 Gy  (5) to start anastrozole 08/22/2016  (a) bone density 10/02/2016 shows a T score of -0.4, in the normal range.    PLAN: Is  now nearly 2 years out from definitive surgery for her breast cancer with no evidence of disease recurrence.  This is very favorable.  She generally tolerates  anastrozole fairly well.  I do not think the gel phenomenon she feels in the morning is really going to be related to this.  The plan is to continue anastrozole for a total of 5 years.  Today I am writing her for gabapentin to take at bedtime.  I think this will help with the nighttime hot flashes.  She will let me know if she has any side effects from that.  If she does not on tolerates it well then we will add venlafaxine in the morning but she will call us in about 2 weeks to let us know if she wishes to proceed with that  Otherwise she will see me again in a year.  She knows to call for any other issues that may develop before that visit. Amerigo Mcglory, Virgie Dad, MD  07/30/17 12:01 PM Medical Oncology and Hematology [facility] [address] [place], [place] [address] Tel. [phone]    Fax. [phone]  This document serves as a record of services personally performed by Chauncey Cruel, MD. It was created on his behalf by Margit Banda, a trained medical scribe. The creation of this record is based on the scribe's personal observations and the provider's statements to them.   I have reviewed the above documentation for accuracy and completeness, and I agree with the above.

## 2017-08-03 DIAGNOSIS — Z79891 Long term (current) use of opiate analgesic: Secondary | ICD-10-CM | POA: Diagnosis not present

## 2017-08-03 DIAGNOSIS — M150 Primary generalized (osteo)arthritis: Secondary | ICD-10-CM | POA: Diagnosis not present

## 2017-08-03 DIAGNOSIS — G894 Chronic pain syndrome: Secondary | ICD-10-CM | POA: Diagnosis not present

## 2017-08-06 ENCOUNTER — Other Ambulatory Visit: Payer: Self-pay | Admitting: Family Medicine

## 2017-08-10 ENCOUNTER — Other Ambulatory Visit: Payer: 59

## 2017-08-10 ENCOUNTER — Ambulatory Visit: Payer: 59 | Admitting: Oncology

## 2017-08-10 DIAGNOSIS — M654 Radial styloid tenosynovitis [de Quervain]: Secondary | ICD-10-CM | POA: Diagnosis not present

## 2017-08-10 DIAGNOSIS — M25531 Pain in right wrist: Secondary | ICD-10-CM | POA: Diagnosis not present

## 2017-08-10 DIAGNOSIS — M7989 Other specified soft tissue disorders: Secondary | ICD-10-CM | POA: Diagnosis not present

## 2017-08-24 DIAGNOSIS — C50911 Malignant neoplasm of unspecified site of right female breast: Secondary | ICD-10-CM | POA: Diagnosis not present

## 2017-08-24 DIAGNOSIS — Z853 Personal history of malignant neoplasm of breast: Secondary | ICD-10-CM | POA: Diagnosis not present

## 2017-08-31 ENCOUNTER — Other Ambulatory Visit: Payer: Self-pay | Admitting: Family Medicine

## 2017-09-16 ENCOUNTER — Other Ambulatory Visit: Payer: Self-pay | Admitting: Oncology

## 2017-09-16 DIAGNOSIS — Z1231 Encounter for screening mammogram for malignant neoplasm of breast: Secondary | ICD-10-CM

## 2017-09-24 ENCOUNTER — Other Ambulatory Visit: Payer: Self-pay | Admitting: Family Medicine

## 2017-09-24 NOTE — Telephone Encounter (Signed)
Last OV 07/15/2017. Rx was last refilled 03/27/2017 disp 30 with 5 refills. Sent to PCP for approval.

## 2017-09-25 NOTE — Telephone Encounter (Signed)
Rx was called into pt's pharmacy. 

## 2017-09-25 NOTE — Telephone Encounter (Signed)
Call in #[phone] with 5 rf 

## 2017-09-28 DIAGNOSIS — G894 Chronic pain syndrome: Secondary | ICD-10-CM | POA: Diagnosis not present

## 2017-09-28 DIAGNOSIS — Z79891 Long term (current) use of opiate analgesic: Secondary | ICD-10-CM | POA: Diagnosis not present

## 2017-09-28 DIAGNOSIS — M150 Primary generalized (osteo)arthritis: Secondary | ICD-10-CM | POA: Diagnosis not present

## 2017-10-02 IMAGING — DX DG FOOT COMPLETE 3+V*L*
3 series · 3 of 3 positions shown · non-contrast
Comparison: None.

CLINICAL DATA: Ankle foot pain and swelling for 1 month

EXAM:
LEFT FOOT - COMPLETE 3+ VIEW

[foot ap]
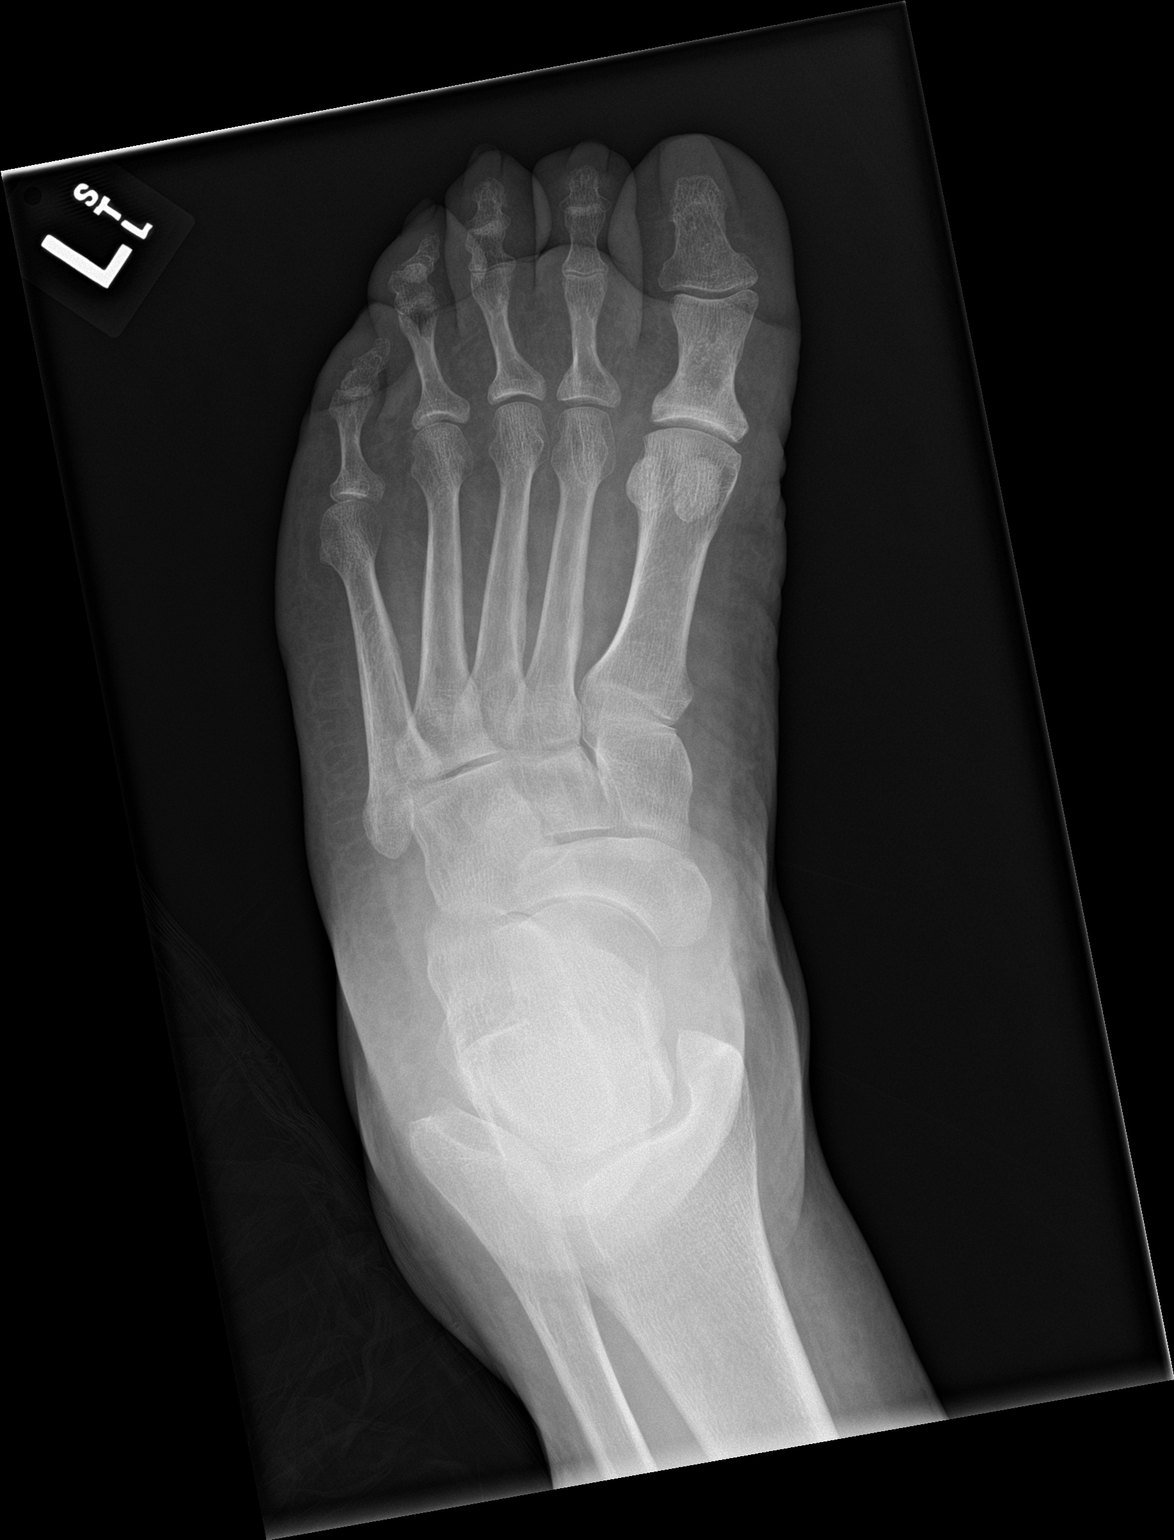

[foot obl]
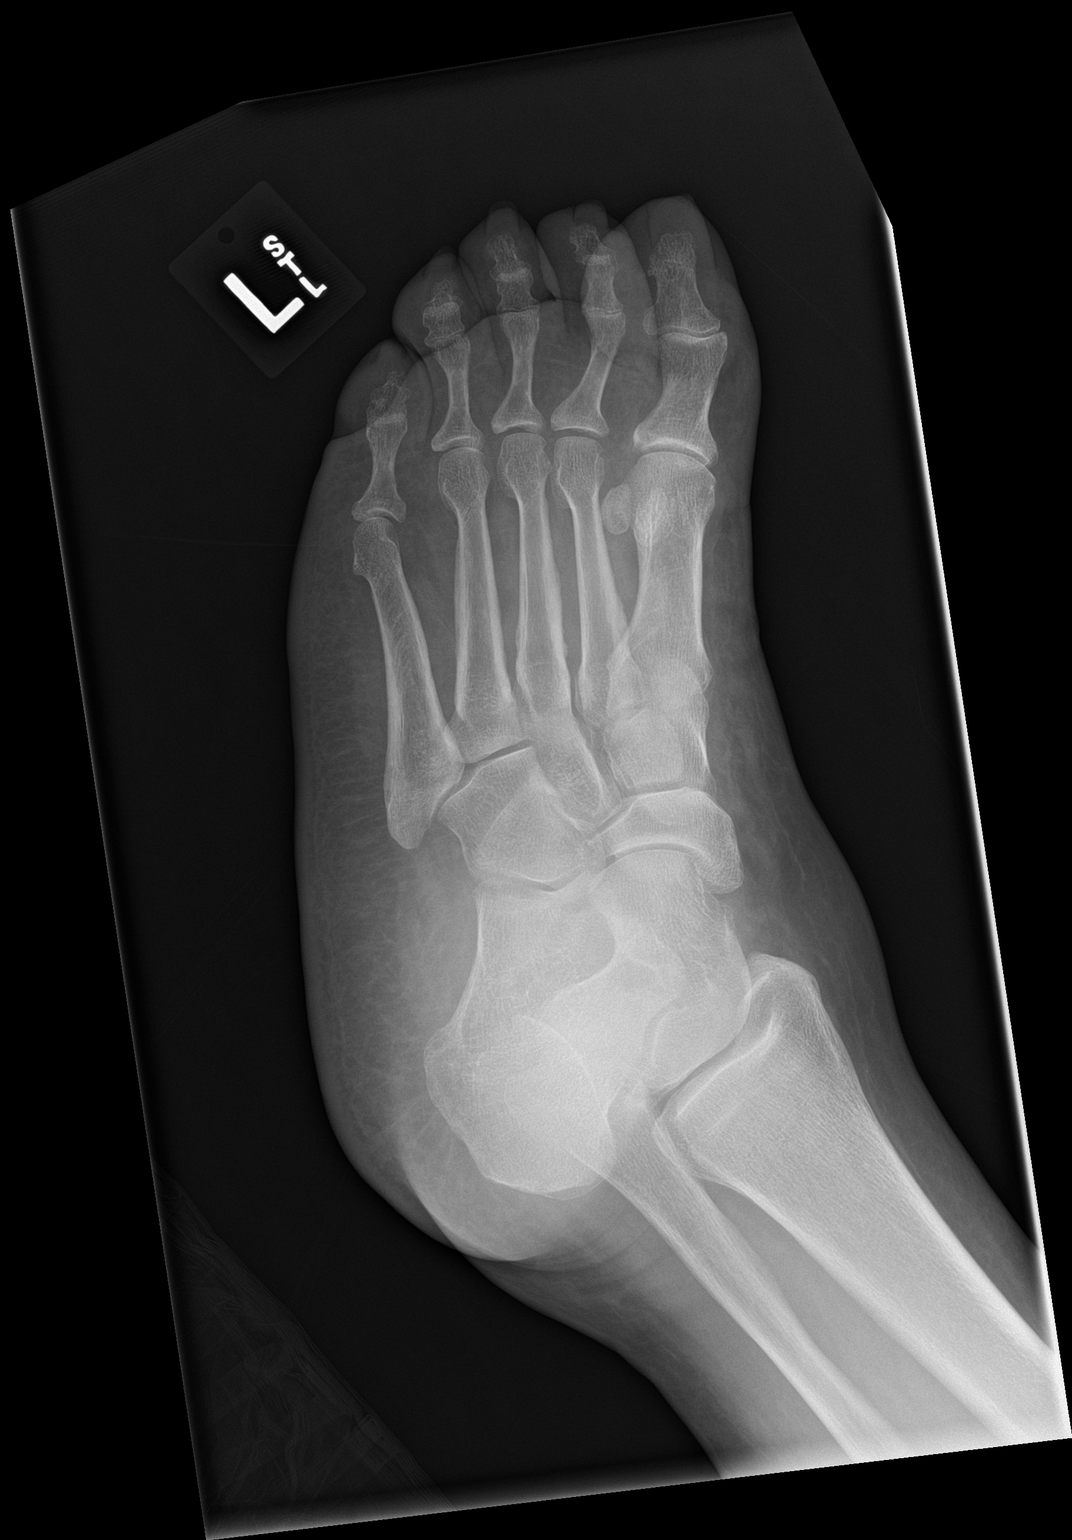

[foot lat]
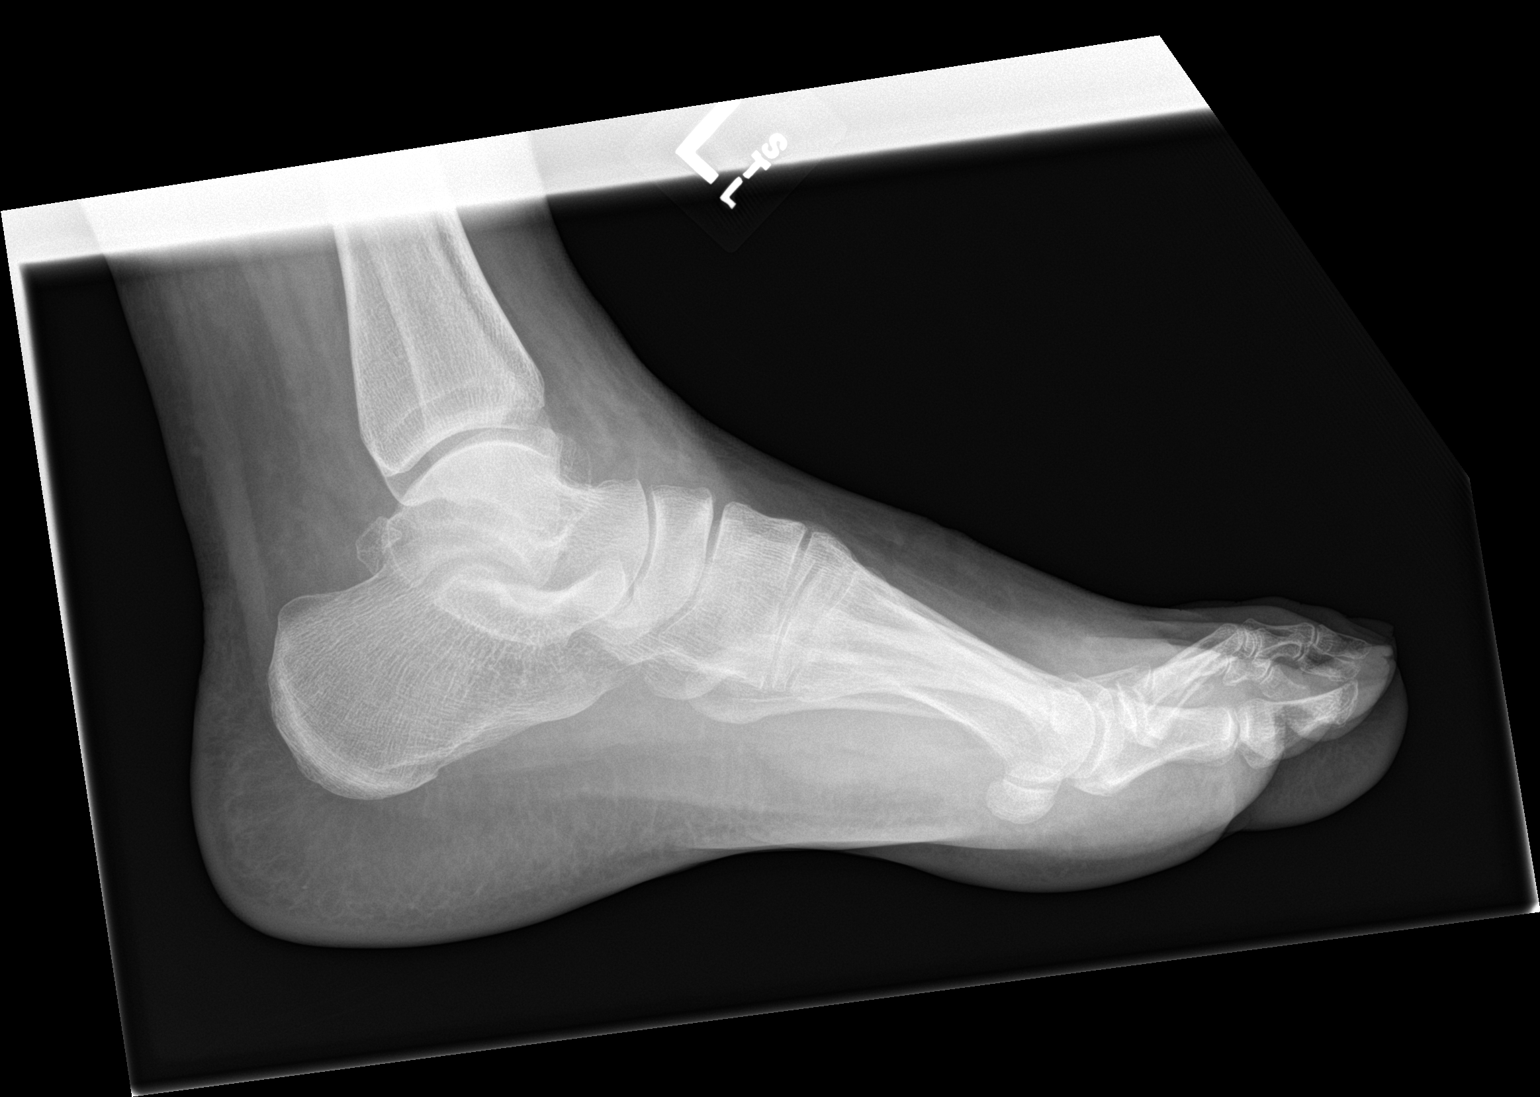

[3 of 3 positions shown; findings below may reference images not displayed]

FINDINGS: No acute fracture or malalignment. Mild osteopenia. Minimal
degenerative changes at the first MTP joint. Diffuse soft tissue
swelling.
IMPRESSION: Mild demineralization.  No acute osseous abnormality.

## 2017-10-03 ENCOUNTER — Other Ambulatory Visit: Payer: Self-pay | Admitting: Physical Medicine and Rehabilitation

## 2017-10-03 DIAGNOSIS — M47816 Spondylosis without myelopathy or radiculopathy, lumbar region: Secondary | ICD-10-CM

## 2017-10-09 ENCOUNTER — Ambulatory Visit
Admission: RE | Admit: 2017-10-09 | Discharge: 2017-10-09 | Disposition: A | Discharge status: Home / Self Care | Payer: 59 | Source: Ambulatory Visit | Attending: Oncology | Admitting: Oncology

## 2017-10-09 DIAGNOSIS — Z1231 Encounter for screening mammogram for malignant neoplasm of breast: Secondary | ICD-10-CM

## 2017-10-09 HISTORY — DX: Personal history of antineoplastic chemotherapy: Z92.21

## 2017-10-09 HISTORY — DX: Personal history of irradiation: Z92.3

## 2017-10-12 ENCOUNTER — Telehealth: Payer: Self-pay | Admitting: Family Medicine

## 2017-10-12 NOTE — Telephone Encounter (Signed)
Patient dropped off disability forms  Fax forms: [phone]   Attn: [employer]  Disposition: Dr's [employer]

## 2017-10-13 ENCOUNTER — Ambulatory Visit: Payer: Self-pay

## 2017-10-13 NOTE — Telephone Encounter (Signed)
Placed for Dr. Sarajane Jews to fill out

## 2017-10-13 NOTE — Telephone Encounter (Signed)
  Reason for Disposition . [1] MODERATE (e.g., interferes with normal activities) pelvic pain AND [2] pain comes and goes (cramps) AND [3] present > 24 hours  Answer Assessment - Initial Assessment Questions 1. LOCATION: "Where does it hurt?"      Left side of stomach into groin and back 2. RADIATION: "Does the pain shoot anywhere else?" (e.g., lower back, groin, thighs)     Into her back 3. ONSET: "When did the pain begin?" (e.g., minutes, hours or days ago)      Started Saturday 4. SUDDEN: "Gradual or sudden onset?"     [place] 5. PATTERN "Does the pain come and go, or is it constant?"    - If constant: "Is it getting better, staying the same, or worsening?"      (Note: Constant means the pain never goes away completely; most serious pain is constant and gets worse over time)     - If intermittent: "How long does it last?" "Do you have pain now?"     (Note: Intermittent means the pain goes away completely between bouts)     Constant "nagging pain" 6. SEVERITY: "How bad is the pain?"  (e.g., Scale 1-10; mild, moderate, or severe)   - MILD (1-3): doesn't interfere with normal activities, area soft and not tender to touch    - MODERATE (4-7): interferes with normal activities or awakens from sleep, tender to touch    - SEVERE (8-10): excruciating pain, doubled over, unable to do any normal activities      7 7. RECURRENT SYMPTOM: "Have you ever had this type of pelvic pain before?" If so, ask: "When was the last time?" and "What happened that time?"      Yes - Kidney infection 8. CAUSE: "What do you think is causing the pelvic pain?"     Unsure 9. RELIEVING/AGGRAVATING FACTORS: "What makes it better or worse?" (e.g., activity/rest, sexual intercourse, voiding, passing stool)     Nothing 10. OTHER SYMPTOMS: "Has there been any other symptoms?" (e.g., fever, vaginal bleeding, vaginal discharge, diarrhea, constipation, or voiding problems?"       No 11. PREGNANCY: "Is there any chance you  are pregnant?" "When was your last menstrual period?"       No  Protocols used: PELVIC PAIN - [facility]

## 2017-10-14 ENCOUNTER — Ambulatory Visit: Payer: 59 | Admitting: Family Medicine

## 2017-10-14 ENCOUNTER — Encounter: Payer: Self-pay | Admitting: Family Medicine

## 2017-10-14 ENCOUNTER — Ambulatory Visit (INDEPENDENT_AMBULATORY_CARE_PROVIDER_SITE_OTHER)
Admission: RE | Admit: 2017-10-14 | Discharge: 2017-10-14 | Disposition: A | Discharge status: Home / Self Care | Payer: 59 | Source: Ambulatory Visit | Attending: Family Medicine | Admitting: Family Medicine

## 2017-10-14 VITALS — BP 150/80 | HR 76 | Temp 98.3°F | Wt 249.7 lb

## 2017-10-14 DIAGNOSIS — R319 Hematuria, unspecified: Secondary | ICD-10-CM | POA: Diagnosis not present

## 2017-10-14 DIAGNOSIS — R1084 Generalized abdominal pain: Secondary | ICD-10-CM | POA: Diagnosis not present

## 2017-10-14 DIAGNOSIS — R109 Unspecified abdominal pain: Secondary | ICD-10-CM | POA: Diagnosis not present

## 2017-10-14 LAB — POCT URINALYSIS DIPSTICK
Bilirubin, UA: NEGATIVE
Glucose, UA: NEGATIVE
Ketones, UA: NEGATIVE
Leukocytes, UA: NEGATIVE
Nitrite, UA: NEGATIVE
Protein, UA: NEGATIVE
Spec Grav, UA: 1.015 (ref 1.010–1.025)
Urobilinogen, UA: 0.2 E.U./dL
pH, UA: 6.5 (ref 5.0–8.0)

## 2017-10-14 NOTE — Progress Notes (Signed)
Subjective:    Patient ID: [name], female    DOB: July 01, 1961, 57 y.o.   MRN: [id number]  No chief complaint on file.   HPI Patient was seen today for acute concern.  Patient endorses pelvic, lower abdominal, lower back pain that started on Saturday.  Pain is noted as a constant, stabbing sensation.  Pain is 6/10 at baseline but then will increase to 10/10.  Patient denies hematuria, dysuria, fever, chills, vomiting, and constipation.  Last BM was last night.  Patient denied having to strain.  Patient endorses baseline nausea and hot flashes as she is currently undergoing treatment for breast cancer.  She is on anastrozole.  Pt also denies any recent heavy lifting or activities out of her normal.  Pt has a h/o complete hysterectomy.  Past Medical History:  Diagnosis Date  . Breast cancer ([place]) 2017   right breast  . Breast cancer of lower-inner quadrant of right female breast ([place]) 08/08/2015  . Colon polyps   . DDD (degenerative disc disease), cervical   . Degenerative joint disease   . Diabetes mellitus without complication ([place])   . Diverticulosis   . Gallstones   . GERD (gastroesophageal reflux disease)   . Headache(784.0)    migraine like per patient  . Hypertension   . Low back pain    dr phillips, pain management  . Osteoarthritis   . Personal history of chemotherapy 2017  . Personal history of radiation therapy 2017    Allergies  Allergen Reactions  . Codeine Itching and Nausea And Vomiting    Itching all over the body  . Latex Hives  . Sulfonamide Derivatives Hives    All over the body    ROS General: Denies fever, chills, night sweats, changes in weight, changes in appetite HEENT: Denies headaches, ear pain, changes in vision, rhinorrhea, sore throat CV: Denies CP, palpitations, SOB, orthopnea Pulm: Denies SOB, cough, wheezing GI: Denies vomiting, diarrhea, constipation  +abdominal pain, nausea GU: Denies dysuria, hematuria, frequency, vaginal  discharge Msk: Denies muscle cramps, joint pains  +low back pain Neuro: Denies weakness, numbness, tingling Skin: Denies rashes, bruising Psych: Denies depression, anxiety, hallucinations     Objective:    Blood pressure (!) 150/80, pulse 76, temperature 98.3 F (36.8 C), temperature source Oral, weight 249 lb 11.2 oz (113.3 kg).   Gen. Pleasant, well-nourished, in no distress, normal affect   HEENT: [place]/AT, face symmetric, no scleral icterus, PERRLA, nares patent without drainage, pharynx without erythema or exudate.  No cervical lymphadenopathy. Lungs: no accessory muscle use, CTAB, no wheezes or rales Cardiovascular: RRR, no m/r/g, no peripheral edema Abdomen: BS present, soft, NT/ND, no hepatosplenomegaly.  LLQ>LUQ and suprapubic abdominal pain.  No masses appreciated. Musculoskeletal: No deformities, no cyanosis or clubbing, normal tone.  TTP of midline lumbar spina and b/l paraspinal muscles L>R.  No CVA tenderness on R pain noted on L.  Negative straight leg raise. Neuro:  A&Ox3, CN II-XII intact, normal gait Skin:  Warm, no lesions/ rash   Wt Readings from Last 3 Encounters:  10/14/17 249 lb 11.2 oz (113.3 kg)  07/30/17 247 lb 6.4 oz (112.2 kg)  07/15/17 247 lb (112 kg)    Lab Results  Component Value Date   WBC 6.8 07/30/2017   HGB 12.7 07/30/2017   HCT 38.3 07/30/2017   PLT 258 07/30/2017   GLUCOSE 116 07/30/2017   CHOL 212 (H) 04/07/2016   TRIG 263.0 (H) 04/07/2016   HDL 35.20 (L) 04/07/2016  LDLDIRECT 121.0 04/07/2016   LDLCALC 135 (H) 12/02/2013   ALT 13 07/30/2017   AST 17 07/30/2017   NA 140 07/30/2017   K 3.3 (L) 07/30/2017   CL 103 03/23/2017   CREATININE 0.9 07/30/2017   BUN 15.6 07/30/2017   CO2 23 07/30/2017   TSH 0.92 04/07/2016   INR 0.90 01/29/2012   HGBA1C 6.3 07/16/2016   MICROALBUR 1.7 06/12/2015    Assessment/Plan:  Hematuria, unspecified type  -UA with 10+ blood, otherrwise negative -will send urine for UCx  -given location of  pain and blood on UA will evaluate for renal calculi. -if needed will send in rx for flomax. - Plan: CT RENAL STONE STUDY, Urine Culture  Generalized abdominal pain  -discussed possible causes including UTI, MSK strain, constipation, renal calculi, diverticulosis, diverticulitis, hernia, GU conditions-though pt s/p complete hysterectomy. - Plan: POCT urinalysis dipstick.   -Will send urine for UCx  F/u prn. Grier Mitts, MD

## 2017-10-14 NOTE — Patient Instructions (Addendum)
You have an appointment for a CT scan to check for kidney stones today at 1:30 pm. [facility]. [employer].  3rd floor [place], [place]    Hematuria, Adult Hematuria is blood in your urine. It can be caused by a bladder infection, kidney infection, prostate infection, kidney stone, or cancer of your urinary tract. Infections can usually be treated with medicine, and a kidney stone usually will pass through your urine. If neither of these is the cause of your hematuria, further workup to find out the reason may be needed. It is very important that you tell your health care provider about any blood you see in your urine, even if the blood stops without treatment or happens without causing pain. Blood in your urine that happens and then stops and then happens again can be a symptom of a very serious condition. Also, pain is not a symptom in the initial stages of many urinary cancers. Follow these instructions at home:  Drink lots of fluid, 3-4 quarts a day. If you have been diagnosed with an infection, cranberry juice is especially recommended, in addition to large amounts of water.  Avoid caffeine, tea, and carbonated beverages because they tend to irritate the bladder.  Avoid alcohol because it may irritate the prostate.  Take all medicines as directed by your health care provider.  If you were prescribed an antibiotic medicine, finish it all even if you start to feel better.  If you have been diagnosed with a kidney stone, follow your health care provider's instructions regarding straining your urine to catch the stone.  Empty your bladder often. Avoid holding urine for long periods of time.  After a bowel movement, women should cleanse front to back. Use each tissue only once.  Empty your bladder before and after sexual intercourse if you are a female. Contact a health care provider if:  You develop back pain.  You have a fever.  You have a feeling of sickness in  your stomach (nausea) or vomiting.  Your symptoms are not better in 3 days. Return sooner if you are getting worse. Get help right away if:  You develop severe vomiting and are unable to keep the medicine down.  You develop severe back or abdominal pain despite taking your medicines.  You begin passing a large amount of blood or clots in your urine.  You feel extremely weak or faint, or you pass out. This information is not intended to replace advice given to you by your health care provider. Make sure you discuss any questions you have with your health care provider. Document Released: 09/08/2005 Document Revised: 02/14/2016 Document Reviewed: 05/09/2013 Elsevier Interactive Patient Education  2017 [employer].

## 2017-10-15 ENCOUNTER — Telehealth: Payer: Self-pay

## 2017-10-15 DIAGNOSIS — M545 Low back pain: Secondary | ICD-10-CM

## 2017-10-15 LAB — URINE CULTURE
MICRO NUMBER:: 90096621
SPECIMEN QUALITY:: ADEQUATE

## 2017-10-15 NOTE — Telephone Encounter (Signed)
REFILL REQUEST FOR LANCETS

## 2017-10-15 NOTE — Telephone Encounter (Signed)
The forms are ready to pick up

## 2017-10-16 MED ORDER — ONETOUCH DELICA LANCETS FINE MISC: 0 refills | Status: DC

## 2017-10-16 NOTE — Telephone Encounter (Signed)
Rx was sent  

## 2017-10-16 NOTE — Telephone Encounter (Signed)
Called and spoke with pt. Pt advised and voiced understanding. Pt wanted forms to be mailed to her. Forms have been placed to be mailed. Copies placed to be scanned into pt's chart.

## 2017-10-28 ENCOUNTER — Emergency Department (HOSPITAL_COMMUNITY): Payer: 59

## 2017-10-28 ENCOUNTER — Emergency Department (HOSPITAL_COMMUNITY)
Admission: EM | Admit: 2017-10-28 | Discharge: 2017-10-28 | Discharge status: Against Medical Advice | Payer: 59 | Attending: Emergency Medicine | Admitting: Emergency Medicine

## 2017-10-28 ENCOUNTER — Encounter (HOSPITAL_COMMUNITY): Payer: Self-pay

## 2017-10-28 ENCOUNTER — Other Ambulatory Visit: Payer: Self-pay

## 2017-10-28 ENCOUNTER — Emergency Department (HOSPITAL_COMMUNITY)
Admission: EM | Admit: 2017-10-28 | Discharge: 2017-10-28 | Disposition: A | Discharge status: Home / Self Care | Payer: 59 | Source: Home / Self Care | Attending: Emergency Medicine | Admitting: Emergency Medicine

## 2017-10-28 DIAGNOSIS — Z79899 Other long term (current) drug therapy: Secondary | ICD-10-CM

## 2017-10-28 DIAGNOSIS — Z96653 Presence of artificial knee joint, bilateral: Secondary | ICD-10-CM

## 2017-10-28 DIAGNOSIS — I10 Essential (primary) hypertension: Secondary | ICD-10-CM | POA: Insufficient documentation

## 2017-10-28 DIAGNOSIS — F17210 Nicotine dependence, cigarettes, uncomplicated: Secondary | ICD-10-CM

## 2017-10-28 DIAGNOSIS — E119 Type 2 diabetes mellitus without complications: Secondary | ICD-10-CM

## 2017-10-28 DIAGNOSIS — M546 Pain in thoracic spine: Secondary | ICD-10-CM | POA: Diagnosis not present

## 2017-10-28 DIAGNOSIS — Z853 Personal history of malignant neoplasm of breast: Secondary | ICD-10-CM | POA: Insufficient documentation

## 2017-10-28 DIAGNOSIS — Z91040 Latex allergy status: Secondary | ICD-10-CM | POA: Insufficient documentation

## 2017-10-28 DIAGNOSIS — M545 Low back pain: Secondary | ICD-10-CM | POA: Insufficient documentation

## 2017-10-28 DIAGNOSIS — M5441 Lumbago with sciatica, right side: Secondary | ICD-10-CM | POA: Diagnosis not present

## 2017-10-28 DIAGNOSIS — S299XXA Unspecified injury of thorax, initial encounter: Secondary | ICD-10-CM | POA: Diagnosis not present

## 2017-10-28 DIAGNOSIS — R079 Chest pain, unspecified: Secondary | ICD-10-CM | POA: Diagnosis not present

## 2017-10-28 DIAGNOSIS — Z7984 Long term (current) use of oral hypoglycemic drugs: Secondary | ICD-10-CM | POA: Insufficient documentation

## 2017-10-28 DIAGNOSIS — M5442 Lumbago with sciatica, left side: Secondary | ICD-10-CM | POA: Diagnosis not present

## 2017-10-28 DIAGNOSIS — Z041 Encounter for examination and observation following transport accident: Secondary | ICD-10-CM | POA: Diagnosis present

## 2017-10-28 DIAGNOSIS — T148XXA Other injury of unspecified body region, initial encounter: Secondary | ICD-10-CM | POA: Diagnosis not present

## 2017-10-28 MED ORDER — METHOCARBAMOL 500 MG PO TABS: 500.0000 mg | ORAL_TABLET | Freq: Every evening | ORAL | 0 refills | Status: DC | PRN

## 2017-10-28 MED ORDER — DICLOFENAC SODIUM 1 % TD GEL: 4.0000 g | Freq: Four times a day (QID) | TRANSDERMAL | 0 refills | Status: DC

## 2017-10-28 MED ORDER — ONDANSETRON 4 MG PO TBDP
4.0000 mg | ORAL_TABLET | Freq: Once | ORAL | Status: AC
  Administered 2017-10-28: 4 mg via ORAL
  Filled 2017-10-28: qty 1

## 2017-10-28 MED ORDER — MORPHINE SULFATE (PF) 4 MG/ML IV SOLN
6.0000 mg | Freq: Once | INTRAVENOUS | Status: AC
  Administered 2017-10-28: 6 mg via INTRAMUSCULAR
  Filled 2017-10-28: qty 2
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
## 2017-10-28 NOTE — ED Triage Notes (Signed)
I just got a phone call from [facility], where pt. Has presented herself to be seen--I will dismiss now.

## 2017-10-31 ENCOUNTER — Ambulatory Visit
Admission: RE | Admit: 2017-10-31 | Discharge: 2017-10-31 | Disposition: A | Discharge status: Home / Self Care | Payer: 59 | Source: Ambulatory Visit | Attending: Physical Medicine and Rehabilitation | Admitting: Physical Medicine and Rehabilitation

## 2017-10-31 DIAGNOSIS — M47816 Spondylosis without myelopathy or radiculopathy, lumbar region: Secondary | ICD-10-CM

## 2017-10-31 DIAGNOSIS — M48061 Spinal stenosis, lumbar region without neurogenic claudication: Secondary | ICD-10-CM | POA: Diagnosis not present

## 2017-11-05 ENCOUNTER — Encounter: Payer: Self-pay | Admitting: Family Medicine

## 2017-11-05 ENCOUNTER — Ambulatory Visit: Payer: 59 | Admitting: Family Medicine

## 2017-11-05 VITALS — BP 152/80 | HR 124 | Temp 98.1°F | Ht 67.0 in | Wt 254.2 lb

## 2017-11-05 DIAGNOSIS — S39012D Strain of muscle, fascia and tendon of lower back, subsequent encounter: Secondary | ICD-10-CM | POA: Diagnosis not present

## 2017-11-05 DIAGNOSIS — S20219D Contusion of unspecified front wall of thorax, subsequent encounter: Secondary | ICD-10-CM | POA: Diagnosis not present

## 2017-11-05 DIAGNOSIS — S161XXD Strain of muscle, fascia and tendon at neck level, subsequent encounter: Secondary | ICD-10-CM | POA: Diagnosis not present

## 2017-11-05 MED ORDER — METHYLPREDNISOLONE 4 MG PO TBPK: ORAL_TABLET | ORAL | 0 refills | Status: DC

## 2017-11-05 MED ORDER — METHOCARBAMOL 750 MG PO TABS: 750.0000 mg | ORAL_TABLET | Freq: Four times a day (QID) | ORAL | 2 refills | Status: DC | PRN
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
## 2017-11-06 ENCOUNTER — Other Ambulatory Visit: Payer: Self-pay | Admitting: Family Medicine

## 2017-11-11 DIAGNOSIS — M6281 Muscle weakness (generalized): Secondary | ICD-10-CM | POA: Diagnosis not present

## 2017-11-11 DIAGNOSIS — M542 Cervicalgia: Secondary | ICD-10-CM | POA: Diagnosis not present

## 2017-11-11 DIAGNOSIS — R293 Abnormal posture: Secondary | ICD-10-CM | POA: Diagnosis not present

## 2017-11-13 DIAGNOSIS — R293 Abnormal posture: Secondary | ICD-10-CM | POA: Diagnosis not present

## 2017-11-13 DIAGNOSIS — M542 Cervicalgia: Secondary | ICD-10-CM | POA: Diagnosis not present

## 2017-11-13 DIAGNOSIS — M6281 Muscle weakness (generalized): Secondary | ICD-10-CM | POA: Diagnosis not present

## 2017-11-15 ENCOUNTER — Other Ambulatory Visit: Payer: Self-pay | Admitting: Family Medicine

## 2017-11-15 DIAGNOSIS — E119 Type 2 diabetes mellitus without complications: Secondary | ICD-10-CM

## 2017-11-16 DIAGNOSIS — R293 Abnormal posture: Secondary | ICD-10-CM | POA: Diagnosis not present

## 2017-11-16 DIAGNOSIS — M542 Cervicalgia: Secondary | ICD-10-CM | POA: Diagnosis not present

## 2017-11-16 DIAGNOSIS — M6281 Muscle weakness (generalized): Secondary | ICD-10-CM | POA: Diagnosis not present

## 2017-11-18 DIAGNOSIS — M542 Cervicalgia: Secondary | ICD-10-CM | POA: Diagnosis not present

## 2017-11-18 DIAGNOSIS — M6281 Muscle weakness (generalized): Secondary | ICD-10-CM | POA: Diagnosis not present

## 2017-11-18 DIAGNOSIS — R293 Abnormal posture: Secondary | ICD-10-CM | POA: Diagnosis not present

## 2017-11-20 DIAGNOSIS — M6281 Muscle weakness (generalized): Secondary | ICD-10-CM | POA: Diagnosis not present

## 2017-11-20 DIAGNOSIS — M542 Cervicalgia: Secondary | ICD-10-CM | POA: Diagnosis not present

## 2017-11-20 DIAGNOSIS — R293 Abnormal posture: Secondary | ICD-10-CM | POA: Diagnosis not present

## 2017-11-23 DIAGNOSIS — Z79891 Long term (current) use of opiate analgesic: Secondary | ICD-10-CM | POA: Diagnosis not present

## 2017-11-23 DIAGNOSIS — M150 Primary generalized (osteo)arthritis: Secondary | ICD-10-CM | POA: Diagnosis not present

## 2017-11-23 DIAGNOSIS — G894 Chronic pain syndrome: Secondary | ICD-10-CM | POA: Diagnosis not present

## 2017-11-24 ENCOUNTER — Other Ambulatory Visit: Payer: Self-pay | Admitting: Family Medicine

## 2017-12-08 ENCOUNTER — Ambulatory Visit: Payer: Self-pay | Admitting: *Deleted

## 2017-12-08 NOTE — Telephone Encounter (Signed)
Pt   Has  A  History  Of  Hypertension . Her   Blood pressure  Was  elevated  Last pm BP  181/105    And  Today ;   She has   A  Headache  Yet  Is  Awake  And  Alert   And  Oriented . Speech   Is  Intact.  Appointment  Made   With Dry Sarajane Jews  For  Tomorrow .  Pt advised if  Any worsening  Of  Symptoms    Call back  Or   Go to  Er  /   Or call  9ii Reason for Disposition . Systolic BP  >= 268 OR Diastolic >= 341  Answer Assessment - Initial Assessment Questions 1. BLOOD PRESSURE: "What is the blood pressure?" "Did you take at least two measurements 5 minutes apart?"    165/100     171/106  2. ONSET: "When did you take your blood pressure?"      1430 3. HOW: "How did you obtain the blood pressure?" (e.g., visiting nurse, automatic home BP monitor)       Automatic   Machine    4. HISTORY: "Do you have a history of high blood pressure?"     Yes 5. MEDICATIONS: "Are you taking any medications for blood pressure?" "Have you missed any doses recently?"     Yes      -  Has    Not  Missed  Any  meds   6. OTHER SYMPTOMS: "Do you have any symptoms?" (e.g., headache, chest pain, blurred vision, difficulty breathing, weakness)      Headache    l  Side  Of   Head    7. PREGNANCY: "Is there any chance you are pregnant?" "When was your last menstrual period?"     n/a  Protocols used: HIGH BLOOD PRESSURE-A-AH

## 2017-12-09 ENCOUNTER — Ambulatory Visit: Payer: 59 | Admitting: Family Medicine

## 2017-12-09 ENCOUNTER — Encounter: Payer: Self-pay | Admitting: Family Medicine

## 2017-12-09 VITALS — BP 150/90 | HR 76 | Temp 98.1°F | Wt 249.8 lb

## 2017-12-09 DIAGNOSIS — E119 Type 2 diabetes mellitus without complications: Secondary | ICD-10-CM | POA: Diagnosis not present

## 2017-12-09 DIAGNOSIS — I10 Essential (primary) hypertension: Secondary | ICD-10-CM

## 2017-12-09 LAB — BASIC METABOLIC PANEL
BUN: 15 mg/dL (ref 6–23)
CO2: 30 mEq/L (ref 19–32)
Calcium: 10 mg/dL (ref 8.4–10.5)
Chloride: 102 mEq/L (ref 96–112)
Creatinine, Ser: 0.88 mg/dL (ref 0.40–1.20)
GFR: 84.86 mL/min (ref >60.00)
Glucose, Bld: 131 mg/dL — ABNORMAL HIGH (ref 70–99)
Potassium: 3.4 mEq/L — ABNORMAL LOW (ref 3.5–5.1)
Sodium: 145 mEq/L (ref 135–145)

## 2017-12-09 LAB — LIPID PANEL
Cholesterol: 276 mg/dL — ABNORMAL HIGH (ref 0–200)
HDL: 53.6 mg/dL (ref >39.00)
NonHDL: 222.2
Total CHOL/HDL Ratio: 5
Triglycerides: 212 mg/dL — ABNORMAL HIGH (ref 0.0–149.0)
VLDL: 42.4 mg/dL — ABNORMAL HIGH (ref 0.0–40.0)

## 2017-12-09 LAB — HEMOGLOBIN A1C: Hgb A1c MFr Bld: 6.6 % — ABNORMAL HIGH (ref 4.6–6.5)

## 2017-12-09 LAB — LDL CHOLESTEROL, DIRECT: Direct LDL: 183 mg/dL

## 2017-12-09 NOTE — Progress Notes (Signed)
   Subjective:    Patient ID: [name], female    DOB: [birth year], 57 y.o.   MRN: [id number]  HPI Here with concerns about her BP. She has been feeling a bit fatigued and has had some mild headaches the past few weeks so she started checking her BP at home. She had a reading 3 days ago up to 185/108, and early this morning she had one at 167/110. At other times it drops to the 150s over 90s. No chest pain or SOB. No leg swelling. Her weight has not changed.    Review of Systems  Constitutional: Negative.   Respiratory: Negative.   Cardiovascular: Negative.   Neurological: Positive for headaches.       Objective:   Physical Exam  Constitutional: She is oriented to person, place, and time. She appears well-developed and well-nourished. No distress.  Neck: No thyromegaly present.  Cardiovascular: Normal rate, regular rhythm, normal heart sounds and intact distal pulses.  Pulmonary/Chest: Effort normal and breath sounds normal. No respiratory distress. She has no wheezes. She has no rales.  Musculoskeletal: She exhibits no edema.  Lymphadenopathy:    She has no cervical adenopathy.  Neurological: She is alert and oriented to person, place, and time.          Assessment & Plan:  HTN, poorly controlled. We will increase the Amlodipine to a total of 10 mg daily and will increase the Lisinopril to a total of 20 mg daily. Get labs today including a BMET. Recheck in one week.  Alysia Penna, MD

## 2017-12-11 DIAGNOSIS — M5416 Radiculopathy, lumbar region: Secondary | ICD-10-CM | POA: Diagnosis not present

## 2017-12-11 DIAGNOSIS — M4726 Other spondylosis with radiculopathy, lumbar region: Secondary | ICD-10-CM | POA: Diagnosis not present

## 2017-12-14 ENCOUNTER — Other Ambulatory Visit: Payer: Self-pay

## 2017-12-14 MED ORDER — POTASSIUM CHLORIDE CRYS ER 20 MEQ PO TBCR: 20.0000 meq | EXTENDED_RELEASE_TABLET | Freq: Every day | ORAL | 3 refills | Status: DC

## 2017-12-14 MED ORDER — LISINOPRIL 10 MG PO TABS: 10.0000 mg | ORAL_TABLET | Freq: Two times a day (BID) | ORAL | 3 refills | Status: DC

## 2017-12-14 NOTE — Telephone Encounter (Signed)
RF klor-con 20 mEq tablets BID. Call in #[phone] with 3 rf  Dose was increased for Lisinopril bid daily pt asked to have a new Rx sent in.

## 2017-12-22 ENCOUNTER — Other Ambulatory Visit: Payer: Self-pay | Admitting: Family Medicine

## 2017-12-22 ENCOUNTER — Other Ambulatory Visit: Payer: Self-pay | Admitting: General Surgery

## 2017-12-22 DIAGNOSIS — E119 Type 2 diabetes mellitus without complications: Secondary | ICD-10-CM

## 2017-12-22 DIAGNOSIS — C50511 Malignant neoplasm of lower-outer quadrant of right female breast: Secondary | ICD-10-CM | POA: Diagnosis not present

## 2017-12-22 DIAGNOSIS — R590 Localized enlarged lymph nodes: Secondary | ICD-10-CM

## 2017-12-24 ENCOUNTER — Telehealth: Payer: Self-pay | Admitting: Family Medicine

## 2017-12-24 ENCOUNTER — Ambulatory Visit
Admission: RE | Admit: 2017-12-24 | Discharge: 2017-12-24 | Disposition: A | Discharge status: Home / Self Care | Payer: 59 | Source: Ambulatory Visit | Attending: General Surgery | Admitting: General Surgery

## 2017-12-24 ENCOUNTER — Other Ambulatory Visit: Payer: Self-pay | Admitting: Family Medicine

## 2017-12-24 DIAGNOSIS — N6489 Other specified disorders of breast: Secondary | ICD-10-CM | POA: Diagnosis not present

## 2017-12-24 DIAGNOSIS — R590 Localized enlarged lymph nodes: Secondary | ICD-10-CM

## 2017-12-24 DIAGNOSIS — R928 Other abnormal and inconclusive findings on diagnostic imaging of breast: Secondary | ICD-10-CM | POA: Diagnosis not present

## 2017-12-24 MED ORDER — LISINOPRIL 10 MG PO TABS: 20.0000 mg | ORAL_TABLET | Freq: Once | ORAL | 3 refills | Status: DC

## 2017-12-24 MED ORDER — AMLODIPINE BESYLATE 5 MG PO TABS: 10.0000 mg | ORAL_TABLET | Freq: Once | ORAL | 3 refills | Status: DC

## 2017-12-24 NOTE — Telephone Encounter (Signed)
Copied from [place] [id number]. Topic: Inquiry >> Dec 24, 2017  8:16 AM Pricilla Handler wrote: Reason for CRM: Patient states that Dr. Sarajane Jews changed the amount of both AmLODipine (NORVASC) 5 MG tablet and Lisinopril (PRINIVIL,ZESTRIL) 10 MG tablet that she takes to 4 tablets per day of each medication. Patient needs refills of both medications. Patient will be taking 4 tablets of each medication daily, and needs the quantity to be increased to accomodate. Patient's preferred pharmacy is [id number] [address], [place]. [phone] (Phone)  [phone] (Fax).       Thank You!!!

## 2017-12-24 NOTE — Telephone Encounter (Signed)
Copied from [place] [id number]. Topic: Inquiry >> Dec 24, 2017  8:16 AM Pricilla Handler wrote: Reason for CRM: Patient states that Dr. Sarajane Jews changed the amount of both AmLODipine (NORVASC) 5 MG tablet and Lisinopril (PRINIVIL,ZESTRIL) 10 MG tablet that she takes to 4 tablets per day of each medication. Patient needs refills of both medications. Patient will be taking 4 tablets of each medication daily, and needs the quantity to be increased to accomodate. Patient's preferred pharmacy is [id number] [address], [place]. [phone] (Phone)  [phone] (Fax).       Thank You!!!

## 2017-12-24 NOTE — Telephone Encounter (Signed)
Medication has been sent to pts pharmacy. 

## 2017-12-24 NOTE — Telephone Encounter (Signed)
Called and spoke with pt. Advised pt that she only needs to be taking lisinopril 10 Mg 1 tablet twice daily or 2 tablets once daily, amlodipine 5 mg take 1 tablet twice daily or two tablets once daily. Pt advised and voiced understanding. Pt advised on the correct amount to be taking. We will [facility] with clear directions for the pt.

## 2017-12-24 NOTE — Telephone Encounter (Signed)
Duplicate note- CRM has been routed to provider for review

## 2018-01-01 ENCOUNTER — Other Ambulatory Visit: Payer: Self-pay | Admitting: Family Medicine

## 2018-01-18 DIAGNOSIS — M150 Primary generalized (osteo)arthritis: Secondary | ICD-10-CM | POA: Diagnosis not present

## 2018-01-18 DIAGNOSIS — G894 Chronic pain syndrome: Secondary | ICD-10-CM | POA: Diagnosis not present

## 2018-01-18 DIAGNOSIS — Z79891 Long term (current) use of opiate analgesic: Secondary | ICD-10-CM | POA: Diagnosis not present

## 2018-01-21 ENCOUNTER — Encounter (INDEPENDENT_AMBULATORY_CARE_PROVIDER_SITE_OTHER): Payer: Self-pay | Admitting: Specialist

## 2018-01-21 ENCOUNTER — Ambulatory Visit (INDEPENDENT_AMBULATORY_CARE_PROVIDER_SITE_OTHER): Payer: 59 | Admitting: Specialist

## 2018-01-21 VITALS — BP 149/83 | HR 74 | Temp 97.3°F | Ht 67.0 in | Wt 249.0 lb

## 2018-01-21 DIAGNOSIS — M5126 Other intervertebral disc displacement, lumbar region: Secondary | ICD-10-CM | POA: Diagnosis not present

## 2018-01-21 DIAGNOSIS — M5136 Other intervertebral disc degeneration, lumbar region: Secondary | ICD-10-CM

## 2018-01-21 DIAGNOSIS — Q7649 Other congenital malformations of spine, not associated with scoliosis: Secondary | ICD-10-CM
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
## 2018-01-21 NOTE — Patient Instructions (Signed)
Avoid frequent bending and stooping  No lifting greater than 10 lbs. May use ice or moist heat for pain. Weight loss is of benefit. Handicap license is approved.   

## 2018-01-25 ENCOUNTER — Telehealth (INDEPENDENT_AMBULATORY_CARE_PROVIDER_SITE_OTHER): Payer: Self-pay | Admitting: Specialist

## 2018-01-25 NOTE — Telephone Encounter (Signed)
I called and advised her that when they call her, to let them know that she would like to go to [facility]

## 2018-01-25 NOTE — Telephone Encounter (Signed)
Patient called stating that she would like to go to the [facility] facility in [place], not [place].  CB#[phone].  Thank you.

## 2018-01-27 ENCOUNTER — Encounter: Payer: Self-pay | Admitting: Internal Medicine

## 2018-01-27 DIAGNOSIS — M2560 Stiffness of unspecified joint, not elsewhere classified: Secondary | ICD-10-CM | POA: Diagnosis not present

## 2018-01-27 DIAGNOSIS — M5441 Lumbago with sciatica, right side: Secondary | ICD-10-CM | POA: Diagnosis not present

## 2018-01-27 DIAGNOSIS — M545 Low back pain: Secondary | ICD-10-CM | POA: Diagnosis not present

## 2018-01-29 DIAGNOSIS — M2560 Stiffness of unspecified joint, not elsewhere classified: Secondary | ICD-10-CM | POA: Diagnosis not present

## 2018-01-29 DIAGNOSIS — M545 Low back pain: Secondary | ICD-10-CM | POA: Diagnosis not present

## 2018-01-29 DIAGNOSIS — M5441 Lumbago with sciatica, right side: Secondary | ICD-10-CM | POA: Diagnosis not present

## 2018-02-02 ENCOUNTER — Encounter: Payer: Self-pay | Admitting: Internal Medicine

## 2018-02-10 DIAGNOSIS — M5441 Lumbago with sciatica, right side: Secondary | ICD-10-CM | POA: Diagnosis not present

## 2018-02-10 DIAGNOSIS — M2560 Stiffness of unspecified joint, not elsewhere classified: Secondary | ICD-10-CM | POA: Diagnosis not present

## 2018-02-10 DIAGNOSIS — M545 Low back pain: Secondary | ICD-10-CM | POA: Diagnosis not present

## 2018-02-12 DIAGNOSIS — M5441 Lumbago with sciatica, right side: Secondary | ICD-10-CM | POA: Diagnosis not present

## 2018-02-12 DIAGNOSIS — M545 Low back pain: Secondary | ICD-10-CM | POA: Diagnosis not present

## 2018-02-12 DIAGNOSIS — M2560 Stiffness of unspecified joint, not elsewhere classified: Secondary | ICD-10-CM | POA: Diagnosis not present

## 2018-02-16 ENCOUNTER — Encounter: Payer: Self-pay | Admitting: Family Medicine

## 2018-02-16 ENCOUNTER — Ambulatory Visit (INDEPENDENT_AMBULATORY_CARE_PROVIDER_SITE_OTHER): Payer: 59 | Admitting: Family Medicine

## 2018-02-16 ENCOUNTER — Ambulatory Visit (INDEPENDENT_AMBULATORY_CARE_PROVIDER_SITE_OTHER)
Admission: RE | Admit: 2018-02-16 | Discharge: 2018-02-16 | Disposition: A | Discharge status: Home / Self Care | Payer: 59 | Source: Ambulatory Visit | Attending: Family Medicine | Admitting: Family Medicine

## 2018-02-16 VITALS — BP 120/70 | HR 80 | Temp 98.3°F | Ht 67.0 in | Wt 252.8 lb

## 2018-02-16 DIAGNOSIS — M542 Cervicalgia: Secondary | ICD-10-CM
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
## 2018-02-17 ENCOUNTER — Telehealth: Payer: Self-pay | Admitting: Family Medicine

## 2018-02-17 DIAGNOSIS — M2560 Stiffness of unspecified joint, not elsewhere classified: Secondary | ICD-10-CM | POA: Diagnosis not present

## 2018-02-17 DIAGNOSIS — M5441 Lumbago with sciatica, right side: Secondary | ICD-10-CM | POA: Diagnosis not present

## 2018-02-17 DIAGNOSIS — M545 Low back pain: Secondary | ICD-10-CM | POA: Diagnosis not present

## 2018-02-17 NOTE — Telephone Encounter (Signed)
Copied from [place] [id number]. Topic: Quick Communication - See Telephone Encounter >> Feb 17, 2018 12:59 PM Cleaster Corin, NT wrote: CRM for notification. See Telephone encounter for: 02/17/18. Pt. Calling back to receive results from x-ray done on yesterday 02/16/18 pt. Can be reached at [phone]

## 2018-02-17 NOTE — Telephone Encounter (Signed)
FYI. Results have not yet been reviewed by provider.

## 2018-02-19 DIAGNOSIS — M5441 Lumbago with sciatica, right side: Secondary | ICD-10-CM | POA: Diagnosis not present

## 2018-02-19 DIAGNOSIS — M2560 Stiffness of unspecified joint, not elsewhere classified: Secondary | ICD-10-CM | POA: Diagnosis not present

## 2018-02-19 DIAGNOSIS — M545 Low back pain: Secondary | ICD-10-CM | POA: Diagnosis not present

## 2018-02-19 NOTE — Telephone Encounter (Signed)
Sent to PCP to review X-ray results

## 2018-02-19 NOTE — Telephone Encounter (Signed)
Patient is calling back to get Xray results. [phone]

## 2018-02-19 NOTE — Telephone Encounter (Signed)
Called and spoke with pt. Pt advised of her X-ray results and voiced understanding.

## 2018-02-19 NOTE — Telephone Encounter (Signed)
See my Result Note  

## 2018-02-22 DIAGNOSIS — M5441 Lumbago with sciatica, right side: Secondary | ICD-10-CM | POA: Diagnosis not present

## 2018-02-22 DIAGNOSIS — M2560 Stiffness of unspecified joint, not elsewhere classified: Secondary | ICD-10-CM | POA: Diagnosis not present

## 2018-02-22 DIAGNOSIS — M545 Low back pain: Secondary | ICD-10-CM | POA: Diagnosis not present

## 2018-02-23 DIAGNOSIS — M545 Low back pain: Secondary | ICD-10-CM | POA: Diagnosis not present

## 2018-02-23 DIAGNOSIS — M2560 Stiffness of unspecified joint, not elsewhere classified: Secondary | ICD-10-CM | POA: Diagnosis not present

## 2018-02-23 DIAGNOSIS — M5441 Lumbago with sciatica, right side: Secondary | ICD-10-CM | POA: Diagnosis not present

## 2018-03-02 DIAGNOSIS — M5441 Lumbago with sciatica, right side: Secondary | ICD-10-CM | POA: Diagnosis not present

## 2018-03-02 DIAGNOSIS — M545 Low back pain: Secondary | ICD-10-CM | POA: Diagnosis not present

## 2018-03-02 DIAGNOSIS — M2560 Stiffness of unspecified joint, not elsewhere classified: Secondary | ICD-10-CM | POA: Diagnosis not present

## 2018-03-03 ENCOUNTER — Ambulatory Visit (INDEPENDENT_AMBULATORY_CARE_PROVIDER_SITE_OTHER): Payer: 59 | Admitting: Specialist

## 2018-03-03 ENCOUNTER — Encounter (INDEPENDENT_AMBULATORY_CARE_PROVIDER_SITE_OTHER): Payer: Self-pay | Admitting: Specialist

## 2018-03-03 ENCOUNTER — Telehealth (INDEPENDENT_AMBULATORY_CARE_PROVIDER_SITE_OTHER): Payer: Self-pay | Admitting: Specialist

## 2018-03-03 VITALS — BP 184/99 | HR 62 | Ht 67.0 in | Wt 249.0 lb

## 2018-03-03 DIAGNOSIS — M5030 Other cervical disc degeneration, unspecified cervical region: Secondary | ICD-10-CM | POA: Diagnosis not present

## 2018-03-03 DIAGNOSIS — M48062 Spinal stenosis, lumbar region with neurogenic claudication: Secondary | ICD-10-CM | POA: Diagnosis not present

## 2018-03-03 DIAGNOSIS — R29818 Other symptoms and signs involving the nervous system: Secondary | ICD-10-CM

## 2018-03-03 DIAGNOSIS — M4722 Other spondylosis with radiculopathy, cervical region: Secondary | ICD-10-CM

## 2018-03-03 DIAGNOSIS — M5136 Other intervertebral disc degeneration, lumbar region: Secondary | ICD-10-CM

## 2018-03-03 MED ORDER — PREGABALIN 75 MG PO CAPS: 75.0000 mg | ORAL_CAPSULE | Freq: Two times a day (BID) | ORAL | 0 refills | Status: DC

## 2018-03-03 NOTE — Telephone Encounter (Signed)
Patient called asked if she should continue physical therapy?

## 2018-03-03 NOTE — Telephone Encounter (Signed)
Patient called asked if she should continue physical therapy? The number to contact patient is [phone]
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
## 2018-03-03 NOTE — Patient Instructions (Signed)
Avoid overhead lifting and overhead use of the arms. Do not lift greater than 5 lbs. Adjust head rest in vehicle to prevent hyperextension if rear ended. Take extra precautions to avoid falling.Avoid bending, stooping and avoid lifting weights greater than 10 lbs. Avoid prolong standing and walking. Avoid frequent bending and stooping   Weight loss is of benefit. Handicap license is approved. MRI of the cervical spine to assess for nerve pinch on the right side at C5-6

## 2018-03-05 ENCOUNTER — Other Ambulatory Visit: Payer: Self-pay

## 2018-03-05 ENCOUNTER — Encounter (HOSPITAL_COMMUNITY): Payer: Self-pay | Admitting: Emergency Medicine

## 2018-03-05 ENCOUNTER — Ambulatory Visit: Payer: 59 | Admitting: Family Medicine

## 2018-03-05 ENCOUNTER — Emergency Department (HOSPITAL_COMMUNITY)
Admission: EM | Admit: 2018-03-05 | Discharge: 2018-03-05 | Disposition: A | Discharge status: Home / Self Care | Payer: 59 | Attending: Emergency Medicine | Admitting: Emergency Medicine

## 2018-03-05 ENCOUNTER — Ambulatory Visit: Payer: Self-pay

## 2018-03-05 ENCOUNTER — Emergency Department (HOSPITAL_COMMUNITY): Payer: 59

## 2018-03-05 DIAGNOSIS — F17210 Nicotine dependence, cigarettes, uncomplicated: Secondary | ICD-10-CM | POA: Insufficient documentation

## 2018-03-05 DIAGNOSIS — Z85831 Personal history of malignant neoplasm of soft tissue: Secondary | ICD-10-CM | POA: Insufficient documentation

## 2018-03-05 DIAGNOSIS — D179 Benign lipomatous neoplasm, unspecified: Secondary | ICD-10-CM | POA: Diagnosis not present

## 2018-03-05 DIAGNOSIS — Z96653 Presence of artificial knee joint, bilateral: Secondary | ICD-10-CM | POA: Diagnosis not present

## 2018-03-05 DIAGNOSIS — I10 Essential (primary) hypertension: Secondary | ICD-10-CM | POA: Insufficient documentation

## 2018-03-05 DIAGNOSIS — E119 Type 2 diabetes mellitus without complications: Secondary | ICD-10-CM | POA: Insufficient documentation

## 2018-03-05 DIAGNOSIS — Z853 Personal history of malignant neoplasm of breast: Secondary | ICD-10-CM | POA: Diagnosis not present

## 2018-03-05 DIAGNOSIS — Z79899 Other long term (current) drug therapy: Secondary | ICD-10-CM | POA: Diagnosis not present

## 2018-03-05 DIAGNOSIS — R202 Paresthesia of skin: Secondary | ICD-10-CM

## 2018-03-05 DIAGNOSIS — Z91040 Latex allergy status: Secondary | ICD-10-CM | POA: Diagnosis not present

## 2018-03-05 DIAGNOSIS — E876 Hypokalemia: Secondary | ICD-10-CM | POA: Insufficient documentation

## 2018-03-05 DIAGNOSIS — R200 Anesthesia of skin: Secondary | ICD-10-CM | POA: Diagnosis not present

## 2018-03-05 DIAGNOSIS — Z7984 Long term (current) use of oral hypoglycemic drugs: Secondary | ICD-10-CM | POA: Insufficient documentation

## 2018-03-05 DIAGNOSIS — R42 Dizziness and giddiness: Secondary | ICD-10-CM | POA: Diagnosis not present

## 2018-03-05 LAB — PROTIME-INR
INR: 0.95
Prothrombin Time: 12.6 seconds (ref 11.4–15.2)

## 2018-03-05 LAB — COMPREHENSIVE METABOLIC PANEL
ALT: 18 U/L (ref 14–54)
AST: 31 U/L (ref 15–41)
Albumin: 4.4 g/dL (ref 3.5–5.0)
Alkaline Phosphatase: 76 U/L (ref 38–126)
Anion gap: 13 (ref 5–15)
BUN: 10 mg/dL (ref 6–20)
CO2: 29 mmol/L (ref 22–32)
Calcium: 9.3 mg/dL (ref 8.9–10.3)
Chloride: 99 mmol/L — ABNORMAL LOW (ref 101–111)
Creatinine, Ser: 0.92 mg/dL (ref 0.44–1.00)
GFR calc Af Amer: >60 mL/min (ref >60)
GFR calc non Af Amer: >60 mL/min (ref >60)
Glucose, Bld: 124 mg/dL — ABNORMAL HIGH (ref 65–99)
Potassium: 3.1 mmol/L — ABNORMAL LOW (ref 3.5–5.1)
Sodium: 141 mmol/L (ref 135–145)
Total Bilirubin: 0.6 mg/dL (ref 0.3–1.2)
Total Protein: 7.2 g/dL (ref 6.5–8.1)

## 2018-03-05 LAB — CBC
HCT: 42.1 % (ref 36.0–46.0)
Hemoglobin: 13.4 g/dL (ref 12.0–15.0)
MCH: 29.3 pg (ref 26.0–34.0)
MCHC: 31.8 g/dL (ref 30.0–36.0)
MCV: 92.1 fL (ref 78.0–100.0)
Platelets: 320 10*3/uL (ref 150–400)
RBC: 4.57 MIL/uL (ref 3.87–5.11)
RDW: 13.7 % (ref 11.5–15.5)
WBC: 7.9 10*3/uL (ref 4.0–10.5)

## 2018-03-05 LAB — APTT: aPTT: 32 seconds (ref 24–36)

## 2018-03-05 LAB — DIFFERENTIAL
Abs Immature Granulocytes: 0 10*3/uL (ref 0.0–0.1)
Basophils Absolute: 0 10*3/uL (ref 0.0–0.1)
Basophils Relative: 0 %
Eosinophils Absolute: 0.1 10*3/uL (ref 0.0–0.7)
Eosinophils Relative: 1 %
Immature Granulocytes: 0 %
Lymphocytes Relative: 29 %
Lymphs Abs: 2.3 10*3/uL (ref 0.7–4.0)
Monocytes Absolute: 0.4 10*3/uL (ref 0.1–1.0)
Monocytes Relative: 5 %
Neutro Abs: 5.1 10*3/uL (ref 1.7–7.7)
Neutrophils Relative %: 65 %

## 2018-03-05 LAB — I-STAT CHEM 8, ED
BUN: 12 mg/dL (ref 6–20)
Calcium, Ion: 1 mmol/L — ABNORMAL LOW (ref 1.15–1.40)
Chloride: 98 mmol/L — ABNORMAL LOW (ref 101–111)
Creatinine, Ser: 0.8 mg/dL (ref 0.44–1.00)
Glucose, Bld: 119 mg/dL — ABNORMAL HIGH (ref 65–99)
HCT: 41 % (ref 36.0–46.0)
Hemoglobin: 13.9 g/dL (ref 12.0–15.0)
Potassium: 2.9 mmol/L — ABNORMAL LOW (ref 3.5–5.1)
Sodium: 140 mmol/L (ref 135–145)
TCO2: 29 mmol/L (ref 22–32)

## 2018-03-05 LAB — MAGNESIUM: Magnesium: 1.6 mg/dL — ABNORMAL LOW (ref 1.7–2.4)

## 2018-03-05 LAB — I-STAT TROPONIN, ED: Troponin i, poc: 0 ng/mL (ref 0.00–0.08)

## 2018-03-05 MED ORDER — MAGNESIUM OXIDE 400 (241.3 MG) MG PO TABS
400.0000 mg | ORAL_TABLET | Freq: Once | ORAL | Status: AC
  Administered 2018-03-05: 400 mg via ORAL
  Filled 2018-03-05: qty 1

## 2018-03-05 MED ORDER — POTASSIUM CHLORIDE CRYS ER 20 MEQ PO TBCR: 20.0000 meq | EXTENDED_RELEASE_TABLET | Freq: Two times a day (BID) | ORAL | 0 refills | Status: DC

## 2018-03-05 MED ORDER — MAGNESIUM OXIDE -MG SUPPLEMENT 200 MG PO TABS: 400.0000 mg | ORAL_TABLET | Freq: Every day | ORAL | 0 refills | Status: AC

## 2018-03-05 MED ORDER — POTASSIUM CHLORIDE CRYS ER 20 MEQ PO TBCR
40.0000 meq | EXTENDED_RELEASE_TABLET | Freq: Once | ORAL | Status: AC
  Administered 2018-03-05: 40 meq via ORAL
  Filled 2018-03-05: qty 2

## 2018-03-05 NOTE — ED Triage Notes (Signed)
Patient complains of sudden onset of dizziness, numbness, and weakness that started at 0900 this morning. No slurred speech, no facial droop, no unilateral weakness, grip strength equal bilaterally, no arm drift, no change in sensation, no change in vision. Patient alert, oriented, and in no apparent distress at this time.

## 2018-03-05 NOTE — ED Notes (Signed)
K- 2.9

## 2018-03-05 NOTE — Telephone Encounter (Signed)
Patient called in with c/o "numbness." She says "I woke up today around 0830-0900, I walked across the floor and noticed my feet were numb and tingling. Then I noticed my hands, arms, legs, top and bottom lips were numb. I was sick yesterday with a virus, vomited once a whole lot, but was able to keep liquids down. When I went to bed, everything was fine, even when I woke in the middle of the night. It is present now." I asked about other neurological symptoms, she denies. She denies weakness. I asked her to smile and grip something to see if everything was equal and not weak, she says "when I smile, it's even, no facial drooping. I can pick up this cup and it's equal with both hands, not weak." According to protocol, see PCP within 3 days. Appointment scheduled for today at 1430 with Dr. Sarajane Jews, care advice given, patient verbalized understanding.   Reason for Disposition . [1] Numbness or tingling on both sides of body AND [2] is a new symptom present > 24 hours  Answer Assessment - Initial Assessment Questions 1. SYMPTOM: "What is the main symptom you are concerned about?" (e.g., weakness, numbness)     Numbness to hands, lips, legs, arms 2. ONSET: "When did this start?" (minutes, hours, days; while sleeping)     When I woke up around 0830-0900 today 3. LAST NORMAL: "When was the last time you were normal (no symptoms)?"     Last night at midnight when I went to bed; no numbness during night when woke up 4. PATTERN "Does this come and go, or has it been constant since it started?"  "Is it present now?"     Constant 5. CARDIAC SYMPTOMS: "Have you had any of the following symptoms: chest pain, difficulty breathing, palpitations?"     No 6. NEUROLOGIC SYMPTOMS: "Have you had any of the following symptoms: headache, dizziness, vision loss, double vision, changes in speech, unsteady on your feet?"    No 7. OTHER SYMPTOMS: "Do you have any other symptoms?"     No 8. PREGNANCY: "Is there any chance you  are pregnant?" "When was your last menstrual period?"     No  Protocols used: NEUROLOGIC DEFICIT-A-AH

## 2018-03-05 NOTE — Telephone Encounter (Signed)
Noted  

## 2018-03-05 NOTE — ED Provider Notes (Signed)
[facility] EMERGENCY DEPARTMENT Provider Note   CSN: [id number] Arrival date & time: 03/05/18  1239     History   Chief Complaint Chief Complaint  Patient presents with  . Dizziness    HPI [name] is a 57 y.o. female.  HPI  57 year old female with a history of breast cancer, diabetes presents with numbness and tingling.  She states she started feeling fatigued yesterday.  This morning she woke up with multiple areas of numbness/tingling including bilateral lips (upper and lower), bilateral fingers, and bilateral feet/lower legs.  She states this is happened to her before and was related to low potassium and magnesium.  She is on chronic, daily potassium replacement.  She denies any new weakness.  She states is more of a tingling than the numbness.  No headache, blurry vision, chest pain.  No diarrhea.  No urinary symptoms. Symptoms are essentially stable since onset.  Past Medical History:  Diagnosis Date  . Breast cancer ([place]) 2017   right breast  . Breast cancer of lower-inner quadrant of right female breast ([place]) 08/08/2015  . Colon polyps   . DDD (degenerative disc disease), cervical   . Degenerative joint disease   . Diabetes mellitus without complication ([place])   . Diverticulosis   . Gallstones   . GERD (gastroesophageal reflux disease)   . Headache(784.0)    migraine like per patient  . Hypertension   . Low back pain    dr phillips, pain management  . Osteoarthritis   . Personal history of chemotherapy 2017  . Personal history of radiation therapy 2017    Patient Active Problem List   Diagnosis Date Noted  . 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 Insomnia 07/16/2016  . Left groin pain 11/30/2015  . Severe obesity (BMI >= 40) ([place]) 10/26/2015  . Malignant neoplasm of lower-inner quadrant of right breast of female, estrogen receptor positive ([place]) 08/08/2015  . Diabetes mellitus without complication ([place]) 31/51/7616  . Paraspinous mass, left  03/31/2013  . Internal hemorrhoid 02/17/2013  . Abdominal pain, other specified site 02/17/2013  . Neoplasm of soft tissue, left shoulder and left upper arm 08/03/2012  . Lightheadedness 01/29/2012  . Cholelithiasis with cholecystitis 10/08/2011  . Hypokalemia 09/17/2011  . GERD 08/08/2010  . GOUT 12/18/2008  . Essential hypertension 03/15/2007  . HEADACHE 03/15/2007    Past Surgical History:  Procedure Laterality Date  . CHOLECYSTECTOMY  10/28/2011   Procedure: LAPAROSCOPIC CHOLECYSTECTOMY WITH INTRAOPERATIVE CHOLANGIOGRAM;  Surgeon: Earnstine Regal, MD;  [facility];  Service: General;  Laterality: N/A;  . COLONOSCOPY WITH ESOPHAGOGASTRODUODENOSCOPY (EGD)  02/03/2013   with Propofol  . EXAM UNDER ANESTHESIA WITH MANIPULATION OF KNEE Right 05/30/2003  . EXAM UNDER ANESTHESIA WITH MANIPULATION OF KNEE Left 12/27/2002  . GANGLION CYST EXCISION Right    right wrist  . KNEE ARTHROSCOPY Bilateral 01/02/2000  . KNEE ARTHROSCOPY Right   . LAPAROSCOPIC VAGINAL HYSTERECTOMY WITH SALPINGO OOPHORECTOMY Bilateral 07/13/2006  . LIPOMA EXCISION Right 02/21/2008   hip  . MASS EXCISION  08/24/2012   Procedure: EXCISION MASS;  Surgeon: Earnstine Regal, MD;  Location: [facility];  Service: General;  Laterality: Left;  excisie soft tissue masses left shoulder(back) & left upper arm  . MASS EXCISION Left 01/24/2014   Procedure: EXCISION SOFT TISSUE MASSES LOWER LEFT BACK;  Surgeon: Earnstine Regal, MD;  Location: [facility];  Service: General;  Laterality: Left;  Marland Kitchen MASS EXCISION Right 04/30/2016   Procedure: EXCISION  OF SKIN RIGHT CHEST WALL;  Surgeon: Autumn Messing III, MD;  Location: [facility];  Service: General;  Laterality: Right;  EXCISION OF SKIN RIGHT CHEST WALL  . MASTECTOMY Right 2017  . MASTECTOMY W/ SENTINEL NODE BIOPSY Right 10/01/2015   Procedure: RIGHT MASTECTOMY WITH SENTINEL LYMPH NODE BIOPSY;  Surgeon: Autumn Messing III, MD;  Location: [facility];  Service:  General;  Laterality: Right;  . PORT-A-CATH REMOVAL Left 04/30/2016   Procedure: REMOVAL PORT-A-CATH;  Surgeon: Autumn Messing III, MD;  Location: [facility];  Service: General;  Laterality: Left;  REMOVAL PORT-A-CATH  . PORTACATH PLACEMENT Left 11/01/2015   Procedure: INSERTION PORT-A-CATH;  Surgeon: Autumn Messing III, MD;  Location: [facility];  Service: General;  Laterality: Left;  . REPLACEMENT UNICONDYLAR JOINT KNEE Left 04/20/2001  . REVISION TOTAL KNEE ARTHROPLASTY Right 06/18/2004  . REVISION TOTAL KNEE ARTHROPLASTY Left 11/15/2002  . REVISION TOTAL KNEE ARTHROPLASTY Left 10/12/2001  . REVISION TOTAL KNEE ARTHROPLASTY Right 07/09/2015  . TONSILLECTOMY    . TOTAL KNEE ARTHROPLASTY Right 04/25/2003  . TOTAL KNEE ARTHROPLASTY Left 06/25/2009     OB History   None      Home Medications    Prior to Admission medications   Medication Sig Start Date End Date Taking? Authorizing Provider  amitriptyline (ELAVIL) 25 MG tablet Take 25 mg by mouth at bedtime.    Yes [provider]  amLODipine (NORVASC) 5 MG tablet Take 2 tablets (10 mg total) by mouth once for 1 dose. Patient taking differently: Take 10 mg by mouth daily.  12/24/17 03/05/18 Yes Laurey Morale, MD  anastrozole (ARIMIDEX) 1 MG tablet TAKE 1 TABLET(1 MG) BY MOUTH DAILY 07/30/17  Yes [name], Virgie Dad, MD  KLOR-CON M20 20 MEQ tablet Take 1 tablet by mouth 2 (two) times daily. 02/15/18  Yes [provider]  lisinopril (PRINIVIL,ZESTRIL) 10 MG tablet Take 2 tablets (20 mg total) by mouth once for 1 dose. Patient taking differently: Take 10 mg by mouth daily.  12/24/17 03/05/18 Yes Laurey Morale, MD  metFORMIN (GLUCOPHAGE) 500 MG tablet TAKE 1 TABLET(500 MG) BY MOUTH TWICE DAILY WITH A MEAL 06/02/17  Yes Laurey Morale, MD  methadone (DOLOPHINE) 10 MG tablet Take 10 mg by mouth every 8 (eight) hours as needed for moderate pain.    Yes [provider]  methocarbamol (ROBAXIN-750) 750 MG  tablet Take 1 tablet (750 mg total) by mouth every 6 (six) hours as needed for muscle spasms. 11/05/17  Yes Laurey Morale, MD  metoprolol tartrate (LOPRESSOR) 50 MG tablet TAKE 1 TABLET BY MOUTH TWICE A DAY 11/24/17  Yes Laurey Morale, MD  omeprazole (PRILOSEC) 40 MG capsule TAKE 1 CAPSULE(40 MG) BY MOUTH DAILY Patient taking differently: TAKE 1 CAPSULE(40 MG) BY MOUTH EACH MORNING 05/30/16  Yes Laurey Morale, MD  oxyCODONE (ROXICODONE) 15 MG immediate release tablet Take 15 mg by mouth every 6 (six) hours as needed for pain. Reported on 12/10/2015 11/28/15  Yes [provider]  zolpidem (AMBIEN) 10 MG tablet TAKE 1 TABLET BY MOUTH AT BEDTIME AS NEEDED FOR SLEEP 09/25/17  Yes Laurey Morale, MD  Magnesium Oxide (MAG-OXIDE) 200 MG TABS Take 2 tablets (400 mg total) by mouth daily for 5 days. 03/05/18 03/10/18  Sherwood Gambler, MD  ondansetron (ZOFRAN) 8 MG tablet Take 0.5-1 tablets (4-8 mg total) by mouth every 8 (eight) hours as needed for nausea or vomiting. Patient not taking: Reported on 03/05/2018  01/12/17   Gardenia Phlegm, NP  ONE [facility] ULTRA TEST test strip TEST ONCE DAILY 01/01/18   Laurey Morale, MD  [facility] DELICA LANCETS FINE MISC USE TO TEST ONCE DAILY DX CODE E11.9 12/22/17   Laurey Morale, MD  potassium chloride SA (K-DUR,KLOR-CON) 20 MEQ tablet Take 1 tablet (20 mEq total) by mouth 2 (two) times daily for 4 days. 03/05/18 03/09/18  Sherwood Gambler, MD  pregabalin (LYRICA) 75 MG capsule Take 1 capsule (75 mg total) by mouth 2 (two) times daily. 03/03/18   [name], MD    Family History Family History  Problem Relation Age of Onset  . Stomach cancer Mother   . Heart disease Father   . Prostate cancer Father   . Kidney cancer Sister        one out of the four  . Hypertension Sister   . Hypertension Brother   . Crohn's disease Other        nephew  . Colon cancer Maternal Grandmother     Social History Social History   Tobacco Use  . Smoking status: Current Every  Day Smoker    Packs/day: 0.25    Years: 20.00    Pack years: 5.00    Types: Cigarettes  . Smokeless tobacco: Former Systems developer    Quit date: 09/30/2015  . Tobacco comment: smokes about 4 cigs/day; working on quitting - 08/22/16 gwd  Substance Use Topics  . Alcohol use: No    Alcohol/week: 0.0 oz  . Drug use: No     Allergies   Codeine; Latex; and Sulfonamide derivatives   Review of Systems Review of Systems  Constitutional: Positive for fatigue. Negative for fever.  Eyes: Negative for visual disturbance.  Respiratory: Negative for shortness of breath.   Cardiovascular: Negative for chest pain.  Gastrointestinal: Negative for abdominal pain and diarrhea.  Genitourinary: Negative for dysuria.  Neurological: Positive for numbness. Negative for dizziness, weakness and headaches.  All other systems reviewed and are negative.    Physical Exam Updated Vital Signs BP (!) 154/96   Pulse 82   Temp 98.3 F (36.8 C)   Resp (!) 22   SpO2 96%   Physical Exam  Constitutional: She is oriented to person, place, and time. She appears well-developed and well-nourished. No distress.  obese  HENT:  Head: Normocephalic and atraumatic.  Right Ear: External ear normal.  Left Ear: External ear normal.  Nose: Nose normal.  Eyes: Pupils are equal, round, and reactive to light. EOM are normal. Right eye exhibits no discharge. Left eye exhibits no discharge.  Neck: Neck supple.  Cardiovascular: Normal rate, regular rhythm and normal heart sounds.  Pulmonary/Chest: Effort normal and breath sounds normal.  Abdominal: Soft. There is no tenderness.  Neurological: She is alert and oriented to person, place, and time.  CN 3-12 grossly intact. 5/5 strength in all 4 extremities. Grossly normal sensation. Normal finger to nose.   Skin: Skin is warm and dry. She is not diaphoretic.  Nursing note and vitals reviewed.    ED Treatments / Results  Labs (all labs ordered are listed, but only abnormal  results are displayed) Labs Reviewed  COMPREHENSIVE METABOLIC PANEL - Abnormal; Notable for the following components:      Result Value   Potassium 3.1 (*)    Chloride 99 (*)    Glucose, Bld 124 (*)    All other components within normal limits  MAGNESIUM - Abnormal; Notable for the following components:  Magnesium 1.6 (*)    All other components within normal limits  I-STAT CHEM 8, ED - Abnormal; Notable for the following components:   Potassium 2.9 (*)    Chloride 98 (*)    Glucose, Bld 119 (*)    Calcium, Ion 1.00 (*)    All other components within normal limits  PROTIME-INR  APTT  CBC  DIFFERENTIAL  I-STAT TROPONIN, ED  CBG MONITORING, ED    EKG EKG Interpretation  Date/Time:  Friday March 05 2018 12:45:20 EDT Ventricular Rate:  69 PR Interval:  158 QRS Duration: 90 QT Interval:  430 QTC Calculation: 460 R Axis:   22 Text Interpretation:  Normal sinus rhythm Nonspecific ST and T wave abnormality Abnormal ECG nonspecific ST changes similar to July 2018 Confirmed by Sherwood Gambler [phone]) on 03/05/2018 5:57:34 PM   Radiology Ct Head Wo Contrast  Result Date: 03/05/2018 CLINICAL DATA:  Episode of numbness in BILATERAL arms and legs as well as lips this morning, no history of stroke, history of breast cancer, hypertension, smoker EXAM: CT HEAD WITHOUT CONTRAST TECHNIQUE: Contiguous axial images were obtained from the base of the skull through the vertex without intravenous contrast. COMPARISON:  07/03/2010 FINDINGS: Brain: Normal ventricular morphology. No midline shift or mass effect. Normal appearance of brain parenchyma. No intracranial hemorrhage, mass lesion, evidence of acute infarction, or extra-axial fluid collection. Vascular: Minimal atherosclerotic calcifications within internal carotid arteries bilaterally at skull base Skull: Intact Sinuses/Orbits: Clear Other: N/A IMPRESSION: No acute intracranial abnormalities. Electronically Signed   By: Lavonia Dana M.D.   On:  03/05/2018 14:15    Procedures Procedures (including critical care time)  Medications Ordered in ED Medications  potassium chloride SA (K-DUR,KLOR-CON) CR tablet 40 mEq (40 mEq Oral Given 03/05/18 2012)  magnesium oxide (MAG-OX) tablet 400 mg (400 mg Oral Given 03/05/18 2012)     Initial Impression / Assessment and Plan / ED Course  I have reviewed the triage vital signs and the nursing notes.  Pertinent labs & imaging results that were available during my care of the patient were reviewed by me and considered in my medical decision making (see chart for details).     Despite triage complaint, patient has no dizziness.  She has no focal weakness but has more tingling.Given these positive symptoms and bilateral symptoms I think this is likely from the mild hypokalemia and hypomagnesemia.  This is a recurring issue for her.  I will supplement these but she otherwise appears stable for discharge home.  CT head and other blood work unremarkable which were obtained in triage but I highly doubt stroke.  Final Clinical Impressions(s) / ED Diagnoses   Final diagnoses:  Paresthesia  Hypokalemia  Hypomagnesemia    ED Discharge Orders        Ordered    Magnesium Oxide (MAG-OXIDE) 200 MG TABS  Daily     03/05/18 2020    potassium chloride SA (K-DUR,KLOR-CON) 20 MEQ tablet  2 times daily     03/05/18 2020       Sherwood Gambler, MD 03/05/18 [id number]

## 2018-03-05 NOTE — Discharge Instructions (Signed)
Your potassium and magnesium are both low today.  Take the increased potassium prescription given to you in addition to your chronic potassium supplements.  If you develop worsening numbness or tingling or any weakness or other new/concerning symptoms and return to the ER.  Otherwise follow-up with your primary care doctor to get these labs rechecked.

## 2018-03-16 ENCOUNTER — Ambulatory Visit
Admission: RE | Admit: 2018-03-16 | Discharge: 2018-03-16 | Disposition: A | Discharge status: Home / Self Care | Payer: 59 | Source: Ambulatory Visit | Attending: Specialist | Admitting: Specialist

## 2018-03-16 DIAGNOSIS — M4802 Spinal stenosis, cervical region: Secondary | ICD-10-CM | POA: Diagnosis not present

## 2018-03-16 DIAGNOSIS — M5030 Other cervical disc degeneration, unspecified cervical region: Secondary | ICD-10-CM

## 2018-03-30 DIAGNOSIS — M150 Primary generalized (osteo)arthritis: Secondary | ICD-10-CM | POA: Diagnosis not present

## 2018-03-30 DIAGNOSIS — Z79891 Long term (current) use of opiate analgesic: Secondary | ICD-10-CM | POA: Diagnosis not present

## 2018-03-30 DIAGNOSIS — G894 Chronic pain syndrome: Secondary | ICD-10-CM | POA: Diagnosis not present

## 2018-03-31 ENCOUNTER — Ambulatory Visit (AMBULATORY_SURGERY_CENTER): Payer: Self-pay | Admitting: *Deleted

## 2018-03-31 VITALS — Ht 67.0 in | Wt 257.0 lb

## 2018-03-31 DIAGNOSIS — Z86010 Personal history of colonic polyps: Secondary | ICD-10-CM

## 2018-03-31 MED ORDER — NA SULFATE-K SULFATE-MG SULF 17.5-3.13-1.6 GM/177ML PO SOLN: ORAL | 0 refills | Status: DC

## 2018-03-31 NOTE — Progress Notes (Signed)
Patient denies any allergies to eggs or soy. Patient denies any problems with anesthesia/sedation. Patient denies any oxygen use at home. Patient denies taking any diet/weight loss medications or blood thinners. EMMI education video offered, pt declined. Suprep $15 off coupon given to pt.

## 2018-04-01 NOTE — Progress Notes (Signed)
appt 7/25

## 2018-04-05 ENCOUNTER — Encounter: Payer: Self-pay | Admitting: Internal Medicine

## 2018-04-13 ENCOUNTER — Encounter: Payer: Self-pay | Admitting: Internal Medicine

## 2018-04-13 ENCOUNTER — Ambulatory Visit (AMBULATORY_SURGERY_CENTER): Payer: 59 | Admitting: Internal Medicine

## 2018-04-13 VITALS — BP 157/75 | HR 56 | Temp 98.0°F | Resp 13 | Ht 67.0 in | Wt 249.0 lb

## 2018-04-13 DIAGNOSIS — D123 Benign neoplasm of transverse colon: Secondary | ICD-10-CM

## 2018-04-13 DIAGNOSIS — Z86010 Personal history of colonic polyps: Secondary | ICD-10-CM

## 2018-04-13 HISTORY — PX: COLONOSCOPY WITH ESOPHAGOGASTRODUODENOSCOPY (EGD): SHX5779

## 2018-04-13 MED ORDER — SODIUM CHLORIDE 0.9 % IV SOLN: 500.0000 mL | Freq: Once | INTRAVENOUS | Status: DC

## 2018-04-13 NOTE — Progress Notes (Signed)
Pt's states no medical or surgical changes since previsit or office visit. 

## 2018-04-13 NOTE — Progress Notes (Signed)
Called to room to assist during endoscopic procedure.  Patient ID and intended procedure confirmed with present staff. Received instructions for my participation in the procedure from the performing physician.  

## 2018-04-13 NOTE — Progress Notes (Signed)
Report given to PACU, vss 

## 2018-04-13 NOTE — Patient Instructions (Signed)
**  Handouts given on polyps, diverticulosis and hemorrhoids**  YOU HAD AN ENDOSCOPIC PROCEDURE TODAY: Refer to the procedure report and other information in the discharge instructions given to you for any specific questions about what was found during the examination. If this information does not answer your questions, please call [facility] office at [phone] to clarify.   YOU SHOULD EXPECT: Some feelings of bloating in the abdomen. Passage of more gas than usual. Walking can help get rid of the air that was put into your GI tract during the procedure and reduce the bloating. If you had a lower endoscopy (such as a colonoscopy or flexible sigmoidoscopy) you may notice spotting of blood in your stool or on the toilet paper. Some abdominal soreness may be present for a day or two, also.  DIET: Your first meal following the procedure should be a light meal and then it is ok to progress to your normal diet. A half-sandwich or bowl of soup is an example of a good first meal. Heavy or fried foods are harder to digest and may make you feel nauseous or bloated. Drink plenty of fluids but you should avoid alcoholic beverages for 24 hours. If you had a esophageal dilation, please see attached instructions for diet.    ACTIVITY: Your care partner should take you home directly after the procedure. You should plan to take it easy, moving slowly for the rest of the day. You can resume normal activity the day after the procedure however YOU SHOULD NOT DRIVE, use power tools, machinery or perform tasks that involve climbing or major physical exertion for 24 hours (because of the sedation medicines used during the test).   SYMPTOMS TO REPORT IMMEDIATELY: A gastroenterologist can be reached at any hour. Please call [phone]  for any of the following symptoms:  Following lower endoscopy (colonoscopy, flexible sigmoidoscopy) Excessive amounts of blood in the stool  Significant tenderness, worsening of abdominal  pains  Swelling of the abdomen that is new, acute  Fever of 100 or higher    FOLLOW UP:  If any biopsies were taken you will be contacted by phone or by letter within the next 1-3 weeks. Call [phone]  if you have not heard about the biopsies in 3 weeks.  Please also call with any specific questions about appointments or follow up tests.

## 2018-04-13 NOTE — Op Note (Signed)
[facility] Patient Name: [name] Procedure Date: 04/13/2018 7:57 AM MRN: [id number] Endoscopist: Docia Chuck. Henrene Pastor , MD Age: 57 Referring MD:  Date of Birth: 04/12/1961 Gender: Female Account #: [id number] Procedure:                Colonoscopy with cold snare polypectomy x 1 Indications:              High risk colon cancer surveillance: Personal                            history of adenoma with villous component, High                            risk colon cancer surveillance: Personal history of                            multiple (3 or more) adenomas. Prior exams 2011,                            2014 Medicines:                Monitored Anesthesia Care Procedure:                Pre-Anesthesia Assessment:                           - Prior to the procedure, a History and Physical                            was performed, and patient medications and                            allergies were reviewed. The patient's tolerance of                            previous anesthesia was also reviewed. The risks                            and benefits of the procedure and the sedation                            options and risks were discussed with the patient.                            All questions were answered, and informed consent                            was obtained. Prior Anticoagulants: The patient has                            taken no previous anticoagulant or antiplatelet                            agents. ASA Grade Assessment: II - A patient with  mild systemic disease. After reviewing the risks                            and benefits, the patient was deemed in                            satisfactory condition to undergo the procedure.                           After obtaining informed consent, the colonoscope                            was passed under direct vision. Throughout the                            procedure, the patient's blood  pressure, pulse, and                            oxygen saturations were monitored continuously. The                            Colonoscope was introduced through the anus and                            advanced to the the cecum, identified by                            appendiceal orifice and ileocecal valve. The                            ileocecal valve, appendiceal orifice, and rectum                            were photographed. The quality of the bowel                            preparation was good. The colonoscopy was performed                            without difficulty. The patient tolerated the                            procedure well. The bowel preparation used was                            SUPREP. Scope In: 8:30:50 AM Scope Out: 8:46:22 AM Scope Withdrawal Time: 0 hours 12 minutes 46 seconds  Total Procedure Duration: 0 hours 15 minutes 32 seconds  Findings:                 A 2 mm polyp was found in the transverse colon. The                            polyp was removed with a cold snare. Resection and  retrieval were complete.                           Multiple diverticula were found in the left colon.                           Internal hemorrhoids were found during retroflexion.                           The exam was otherwise without abnormality on                            direct and retroflexion views. Complications:            No immediate complications. Estimated blood loss:                            None. Estimated Blood Loss:     Estimated blood loss: none. Impression:               - One 2 mm polyp in the transverse colon, removed                            with a cold snare. Resected and retrieved.                           - Diverticulosis in the left colon.                           - Internal hemorrhoids.                           - The examination was otherwise normal on direct                            and retroflexion  views. Recommendation:           - Repeat colonoscopy in 5 years for surveillance.                           - Patient has a contact number available for                            emergencies. The signs and symptoms of potential                            delayed complications were discussed with the                            patient. Return to normal activities tomorrow.                            Written discharge instructions were provided to the                            patient.                           -  Resume previous diet.                           - Continue present medications.                           - Await pathology results. Docia Chuck. Henrene Pastor, MD 04/13/2018 8:53:06 AM This report has been signed electronically.

## 2018-04-14 ENCOUNTER — Telehealth: Payer: Self-pay | Admitting: *Deleted

## 2018-04-14 NOTE — Telephone Encounter (Signed)
  Follow up Call-  Call back number 04/13/2018  Post procedure Call Back phone  # [phone]  Permission to leave phone message Yes  Some recent data might be hidden     Patient questions:  Do you have a fever, pain , or abdominal swelling? No. Pain Score  0 *  Have you tolerated food without any problems? Yes.    Have you been able to return to your normal activities? Yes.    Do you have any questions about your discharge instructions: Diet   No. Medications  No. Follow up visit  No.  Do you have questions or concerns about your Care? Yes.    Actions: * If pain score is 4 or above: No action needed, pain <4.

## 2018-04-15 ENCOUNTER — Ambulatory Visit (INDEPENDENT_AMBULATORY_CARE_PROVIDER_SITE_OTHER): Payer: 59 | Admitting: Specialist

## 2018-04-15 ENCOUNTER — Encounter (INDEPENDENT_AMBULATORY_CARE_PROVIDER_SITE_OTHER): Payer: Self-pay | Admitting: Specialist

## 2018-04-15 VITALS — BP 150/80 | HR 69 | Ht 67.0 in | Wt 249.0 lb

## 2018-04-15 DIAGNOSIS — M51369 Other intervertebral disc degeneration, lumbar region without mention of lumbar back pain or lower extremity pain: Secondary | ICD-10-CM

## 2018-04-15 DIAGNOSIS — M47812 Spondylosis without myelopathy or radiculopathy, cervical region: Secondary | ICD-10-CM

## 2018-04-15 DIAGNOSIS — M5136 Other intervertebral disc degeneration, lumbar region: Secondary | ICD-10-CM | POA: Diagnosis not present

## 2018-04-15 DIAGNOSIS — M48062 Spinal stenosis, lumbar region with neurogenic claudication: Secondary | ICD-10-CM | POA: Diagnosis not present

## 2018-04-15 DIAGNOSIS — M5030 Other cervical disc degeneration, unspecified cervical region: Secondary | ICD-10-CM

## 2018-04-15 NOTE — Progress Notes (Addendum)
Office Visit Note   Patient: [name]           Date of Birth: July 24, 1961           MRN: [id number] Visit Date: 04/15/2018              Requested by: Laurey Morale, MD [place], [place] [address] PCP: Laurey Morale, MD   Assessment & Plan: Visit Diagnoses:  1. Degenerative disc disease, lumbar   2. Spinal stenosis of lumbar region with neurogenic claudication   3. Spondylosis without myelopathy or radiculopathy, cervical region   4. Degenerative disc disease, cervical    Body mass index is 39 kg/m.  Plan:  Avoid bending, stooping and avoid lifting weights greater than 10 lbs. Avoid prolong standing and walking. Order for a new walker with wheels. Surgery scheduling secretary Kandice Hams, will call you in the next week to schedule for surgery.  Surgery recommended is a two level lumbar fusion L5-S1 and L4-5 this would be done with rods, screws and cages with local bone graft and allograft (donor bone graft). Take methadone and oxycodone for for pain. Risk of surgery includes risk of infection 1 in 100 patients, bleeding 1/2% chance you would need a transfusion.   Risk to the nerves is one in 10,000. You will need to use a brace for 3 months and wean from the brace on the 4th month. Expect improved walking and standing tolerance. Expect relief of leg pain but numbness may persist depending on the length and degree of pressure that has been present. Follow-Up Instructions: Return in about 1 month (around 05/13/2018).   Orders:  No orders of the defined types were placed in this encounter.  No orders of the defined types were placed in this encounter.     Procedures: No procedures performed   Clinical Data: No additional findings.   Subjective: Chief Complaint  Patient presents with  . Neck - Pain    57 year old female with history of low back and left greater than right leg pain for 5 years now. It has been worsening over the last  one year. No bowel or bladder difficulty. She has pain with fatigue in the thighs and legs with standing and walking. She has to use a walking stick ( cane) with walking. She is only able to walk about less than a football field without a walker or cane. The pain is in the posterior lateral thigh and legs left greater than right. She was last seen and studies confirm degenerative disc disease disc height loss and narrowing of the neuroforamen Bilateral L5 at the L5-S1 level with degenerative disc changes and modic end plate changes. She has worsening pain with bending stooping and lifting consistent with mechanical back pain. She is taking methadone and oxycodone for break through pain every 5 hours. The medication is taken primarily  For her back and leg pain now for the last 5 years. She is in pain mangement with Dr. Hardin Negus.   Review of Systems  Constitutional: Negative.   HENT: Negative.   Eyes: Negative.   Respiratory: Negative.  Negative for choking.   Cardiovascular: Negative.   Gastrointestinal: Negative.   Endocrine: Negative.   Genitourinary: Negative.   Musculoskeletal: Negative.   Skin: Negative.   Allergic/Immunologic: Negative.   Neurological: Negative.   Hematological: Negative.   Psychiatric/Behavioral: Negative.      Objective: Vital Signs: BP (!) 150/80 (BP Location: Left Arm, Patient Position:  Sitting)   Pulse 69   Ht 5\' 7"  (1.702 m)   Wt 249 lb (112.9 kg)   BMI 39.00 kg/m   Physical Exam  Constitutional: She is oriented to person, place, and time. She appears well-developed and well-nourished.  HENT:  Head: Normocephalic and atraumatic.  Eyes: Pupils are equal, round, and reactive to light. EOM are normal.  Neck: Normal range of motion. Neck supple.  Pulmonary/Chest: Effort normal and breath sounds normal.  Abdominal: Soft. Bowel sounds are normal.  Neurological: She is alert and oriented to person, place, and time.  Skin: Skin is warm and dry.    Psychiatric: She has a normal mood and affect. Her behavior is normal. Judgment and thought content normal.    Back Exam   Tenderness  The patient is experiencing tenderness in the lumbar.  Range of Motion  Extension: abnormal  Flexion: abnormal  Lateral bend right: abnormal  Lateral bend left: abnormal  Rotation right: abnormal  Rotation left: abnormal   Muscle Strength  Right Quadriceps:  5/5  Left Quadriceps:  5/5  Right Hamstrings:  5/5  Left Hamstrings:  5/5   Tests  Straight leg raise right: negative Straight leg raise left: negative  Reflexes  Patellar: normal Achilles: normal Babinski's sign: normal   Other  Toe walk: normal Heel walk: normal Sensation: normal Gait: normal  Erythema: no back redness Scars: absent  Comments:  Bilateral EHL weakness 4/5      Specialty Comments:  No specialty comments available.  Imaging: No results found.   PMFS History: Patient Active Problem List   Diagnosis Date Noted  . Tenosynovitis, de Quervain 08/10/2017  . 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 Insomnia 07/16/2016  . Left groin pain 11/30/2015  . Severe obesity (BMI >= 40) ([facility]) 10/26/2015  . Malignant neoplasm of lower-inner quadrant of right breast of female, estrogen receptor positive ([facility]) 08/08/2015  . S/P revision of total knee 07/24/2015  . Acute postoperative pain 07/11/2015  . Loose total knee arthroplasty ([facility]) 07/02/2015  . Type 2 diabetes mellitus without complications ([facility]) 26/71/2458  . Adhesive capsulitis of left shoulder 10/11/2014  . Paraspinous mass, left 03/31/2013  . Other symptoms and signs involving the nervous system 03/31/2013  . Internal hemorrhoid 02/17/2013  . Abdominal pain, other specified site 02/17/2013  . Neoplasm of soft tissue, left shoulder and left upper arm 08/03/2012  . Lightheadedness 01/29/2012  . Cholelithiasis with cholecystitis 10/08/2011  . Hypokalemia 09/17/2011  . Gastro-esophageal reflux disease without  esophagitis 08/08/2010  . Gout 12/18/2008  . Essential (primary) hypertension 03/15/2007  . OA (osteoarthritis) of knee 03/15/2007  . HEADACHE 03/15/2007   Past Medical History:  Diagnosis Date  . Breast cancer ([facility]) 2017   right breast  . Breast cancer of lower-inner quadrant of right female breast ([facility]) 08/08/2015  . Colon polyps   . DDD (degenerative disc disease), cervical   . Degenerative joint disease   . Diabetes mellitus without complication ([facility])   . Diverticulosis   . Gallstones   . GERD (gastroesophageal reflux disease)   . Headache(784.0)    migraine like per patient  . Hypertension   . Low back pain    dr phillips, pain management  . Osteoarthritis   . Personal history of chemotherapy 2017  . Personal history of radiation therapy 2017    Family History  Problem Relation Age of Onset  . Stomach cancer Mother   . Heart disease Father   . Prostate cancer Father   . Kidney  cancer Sister        one out of the four  . Hypertension Sister   . Hypertension Brother   . Crohn's disease Other        nephew  . Colon cancer Maternal Grandmother   . Esophageal cancer Neg Hx     Past Surgical History:  Procedure Laterality Date  . CHOLECYSTECTOMY  10/28/2011   Procedure: LAPAROSCOPIC CHOLECYSTECTOMY WITH INTRAOPERATIVE CHOLANGIOGRAM;  Surgeon: Earnstine Regal, MD;  Location: [facility];  Service: General;  Laterality: N/A;  . COLONOSCOPY WITH ESOPHAGOGASTRODUODENOSCOPY (EGD)  02/03/2013   with Propofol  . EXAM UNDER ANESTHESIA WITH MANIPULATION OF KNEE Right 05/30/2003  . EXAM UNDER ANESTHESIA WITH MANIPULATION OF KNEE Left 12/27/2002  . GANGLION CYST EXCISION Right    right wrist  . KNEE ARTHROSCOPY Bilateral 01/02/2000  . KNEE ARTHROSCOPY Right   . LAPAROSCOPIC VAGINAL HYSTERECTOMY WITH SALPINGO OOPHORECTOMY Bilateral 07/13/2006  . LIPOMA EXCISION Right 02/21/2008   hip  . MASS EXCISION  08/24/2012   Procedure: EXCISION MASS;  Surgeon: Earnstine Regal, MD;  Location:  [facility];  Service: General;  Laterality: Left;  excisie soft tissue masses left shoulder(back) & left upper arm  . MASS EXCISION Left 01/24/2014   Procedure: EXCISION SOFT TISSUE MASSES LOWER LEFT BACK;  Surgeon: Earnstine Regal, MD;  Location: [facility];  Service: General;  Laterality: Left;  Marland Kitchen MASS EXCISION Right 04/30/2016   Procedure: EXCISION OF SKIN RIGHT CHEST WALL;  Surgeon: Autumn Messing III, MD;  Location: [facility];  Service: General;  Laterality: Right;  EXCISION OF SKIN RIGHT CHEST WALL  . MASTECTOMY Right 2017  . MASTECTOMY W/ SENTINEL NODE BIOPSY Right 10/01/2015   Procedure: RIGHT MASTECTOMY WITH SENTINEL LYMPH NODE BIOPSY;  Surgeon: Autumn Messing III, MD;  Location: [facility];  Service: General;  Laterality: Right;  . PORT-A-CATH REMOVAL Left 04/30/2016   Procedure: REMOVAL PORT-A-CATH;  Surgeon: Autumn Messing III, MD;  Location: [facility];  Service: General;  Laterality: Left;  REMOVAL PORT-A-CATH  . PORTACATH PLACEMENT Left 11/01/2015   Procedure: INSERTION PORT-A-CATH;  Surgeon: Autumn Messing III, MD;  Location: [facility];  Service: General;  Laterality: Left;  . REPLACEMENT UNICONDYLAR JOINT KNEE Left 04/20/2001  . REVISION TOTAL KNEE ARTHROPLASTY Right 06/18/2004  . REVISION TOTAL KNEE ARTHROPLASTY Left 11/15/2002  . REVISION TOTAL KNEE ARTHROPLASTY Left 10/12/2001  . REVISION TOTAL KNEE ARTHROPLASTY Right 07/09/2015  . TONSILLECTOMY    . TOTAL KNEE ARTHROPLASTY Right 04/25/2003  . TOTAL KNEE ARTHROPLASTY Left 06/25/2009   Social History   Occupational History  . Occupation: retired/disabled  Tobacco Use  . Smoking status: Current Every Day Smoker    Packs/day: 0.25    Years: 20.00    Pack years: 5.00    Types: Cigarettes  . Smokeless tobacco: Former Systems developer    Quit date: 09/30/2015  . Tobacco comment: smokes about 4 cigs/day; working on quitting - 08/22/16 gwd  Substance and Sexual Activity  . Alcohol use: No     Alcohol/week: 0.0 oz  . Drug use: No  . Sexual activity: Yes    Birth control/protection: Post-menopausal

## 2018-04-15 NOTE — Patient Instructions (Signed)
Avoid bending, stooping and avoid lifting weights greater than 10 lbs. Avoid prolong standing and walking. Order for a new walker with wheels. Surgery scheduling secretary Kandice Hams, will call you in the next week to schedule for surgery.  Surgery recommended is a two level lumbar fusion L5-S1 and L4-5 this would be done with rods, screws and cages with local bone graft and allograft (donor bone graft). Take methadone and oxycodone for for pain. Risk of surgery includes risk of infection 1 in 100 patients, bleeding 1/2% chance you would need a transfusion.   Risk to the nerves is one in 10,000. You will need to use a brace for 3 months and wean from the brace on the 4th month. Expect improved walking and standing tolerance. Expect relief of leg pain but numbness may persist depending on the length and degree of pressure that has been present.

## 2018-04-19 ENCOUNTER — Telehealth (INDEPENDENT_AMBULATORY_CARE_PROVIDER_SITE_OTHER): Payer: Self-pay | Admitting: Radiology

## 2018-04-19 ENCOUNTER — Other Ambulatory Visit: Payer: Self-pay | Admitting: Family Medicine

## 2018-04-19 ENCOUNTER — Encounter: Payer: Self-pay | Admitting: Internal Medicine

## 2018-04-19 NOTE — Telephone Encounter (Signed)
Patient called stating that she is suppose to be getting a call about scheduling back surgery with Dr. Louanne Skye, she asked to be called on her cellphone [phone]

## 2018-04-19 NOTE — Telephone Encounter (Signed)
Last OV: 02/16/18 Last filled: 09/25/17, #30, 5 RF Sig: TAKE 1 TABLET BY MOUTH AT BEDTIME AS NEEDED FOR SLEEP

## 2018-04-20 NOTE — Telephone Encounter (Signed)
Call in #[phone] with 5 rf 

## 2018-04-29 NOTE — Telephone Encounter (Signed)
I called patient and advised getting clearance from PCP.  Once received back, I will call her to schedule surgery.

## 2018-05-07 ENCOUNTER — Emergency Department (HOSPITAL_BASED_OUTPATIENT_CLINIC_OR_DEPARTMENT_OTHER): Payer: 59

## 2018-05-07 ENCOUNTER — Encounter: Payer: Self-pay | Admitting: *Deleted

## 2018-05-07 ENCOUNTER — Emergency Department (HOSPITAL_COMMUNITY): Payer: 59

## 2018-05-07 ENCOUNTER — Emergency Department (HOSPITAL_COMMUNITY)
Admission: EM | Admit: 2018-05-07 | Discharge: 2018-05-07 | Disposition: A | Discharge status: Home / Self Care | Payer: 59 | Attending: Emergency Medicine | Admitting: Emergency Medicine

## 2018-05-07 ENCOUNTER — Encounter (HOSPITAL_COMMUNITY): Payer: Self-pay

## 2018-05-07 ENCOUNTER — Ambulatory Visit: Payer: Self-pay | Admitting: Family Medicine

## 2018-05-07 ENCOUNTER — Other Ambulatory Visit: Payer: Self-pay

## 2018-05-07 DIAGNOSIS — M25462 Effusion, left knee: Secondary | ICD-10-CM | POA: Diagnosis not present

## 2018-05-07 DIAGNOSIS — E119 Type 2 diabetes mellitus without complications: Secondary | ICD-10-CM | POA: Insufficient documentation

## 2018-05-07 DIAGNOSIS — R1032 Left lower quadrant pain: Secondary | ICD-10-CM | POA: Insufficient documentation

## 2018-05-07 DIAGNOSIS — R609 Edema, unspecified: Secondary | ICD-10-CM | POA: Diagnosis not present

## 2018-05-07 DIAGNOSIS — I10 Essential (primary) hypertension: Secondary | ICD-10-CM | POA: Insufficient documentation

## 2018-05-07 DIAGNOSIS — M25561 Pain in right knee: Secondary | ICD-10-CM | POA: Diagnosis not present

## 2018-05-07 DIAGNOSIS — Z79899 Other long term (current) drug therapy: Secondary | ICD-10-CM | POA: Diagnosis not present

## 2018-05-07 DIAGNOSIS — F17210 Nicotine dependence, cigarettes, uncomplicated: Secondary | ICD-10-CM | POA: Diagnosis not present

## 2018-05-07 DIAGNOSIS — N281 Cyst of kidney, acquired: Secondary | ICD-10-CM | POA: Diagnosis not present

## 2018-05-07 DIAGNOSIS — M545 Low back pain: Secondary | ICD-10-CM | POA: Diagnosis not present

## 2018-05-07 DIAGNOSIS — Z7984 Long term (current) use of oral hypoglycemic drugs: Secondary | ICD-10-CM | POA: Insufficient documentation

## 2018-05-07 DIAGNOSIS — R2242 Localized swelling, mass and lump, left lower limb: Secondary | ICD-10-CM | POA: Diagnosis not present

## 2018-05-07 DIAGNOSIS — G8929 Other chronic pain: Secondary | ICD-10-CM

## 2018-05-07 DIAGNOSIS — R1031 Right lower quadrant pain: Secondary | ICD-10-CM | POA: Diagnosis not present

## 2018-05-07 DIAGNOSIS — M25562 Pain in left knee: Secondary | ICD-10-CM | POA: Diagnosis not present

## 2018-05-07 LAB — COMPREHENSIVE METABOLIC PANEL
ALT: 16 U/L (ref 0–44)
AST: 20 U/L (ref 15–41)
Albumin: 4.1 g/dL (ref 3.5–5.0)
Alkaline Phosphatase: 71 U/L (ref 38–126)
Anion gap: 12 (ref 5–15)
BUN: 12 mg/dL (ref 6–20)
CO2: 27 mmol/L (ref 22–32)
Calcium: 9.3 mg/dL (ref 8.9–10.3)
Chloride: 100 mmol/L (ref 98–111)
Creatinine, Ser: 0.86 mg/dL (ref 0.44–1.00)
GFR calc Af Amer: >60 mL/min (ref >60)
GFR calc non Af Amer: >60 mL/min (ref >60)
Glucose, Bld: 126 mg/dL — ABNORMAL HIGH (ref 70–99)
Potassium: 3.3 mmol/L — ABNORMAL LOW (ref 3.5–5.1)
Sodium: 139 mmol/L (ref 135–145)
Total Bilirubin: 0.6 mg/dL (ref 0.3–1.2)
Total Protein: 7 g/dL (ref 6.5–8.1)

## 2018-05-07 LAB — CBC
HCT: 40.2 % (ref 36.0–46.0)
Hemoglobin: 12.8 g/dL (ref 12.0–15.0)
MCH: 29.4 pg (ref 26.0–34.0)
MCHC: 31.8 g/dL (ref 30.0–36.0)
MCV: 92.2 fL (ref 78.0–100.0)
Platelets: 330 10*3/uL (ref 150–400)
RBC: 4.36 MIL/uL (ref 3.87–5.11)
RDW: 13.4 % (ref 11.5–15.5)
WBC: 6.4 10*3/uL (ref 4.0–10.5)

## 2018-05-07 LAB — URINALYSIS, ROUTINE W REFLEX MICROSCOPIC
Bacteria, UA: NONE SEEN
Bilirubin Urine: NEGATIVE
Glucose, UA: NEGATIVE mg/dL
Ketones, ur: NEGATIVE mg/dL
Leukocytes, UA: NEGATIVE
Nitrite: NEGATIVE
Protein, ur: NEGATIVE mg/dL
Specific Gravity, Urine: 1.023 (ref 1.005–1.030)
pH: 5 (ref 5.0–8.0)

## 2018-05-07 LAB — LIPASE, BLOOD: Lipase: 28 U/L (ref 11–51)

## 2018-05-07 MED ORDER — FENTANYL CITRATE (PF) 100 MCG/2ML IJ SOLN
50.0000 ug | Freq: Once | INTRAMUSCULAR | Status: AC
  Administered 2018-05-07: 50 ug via INTRAVENOUS
  Filled 2018-05-07: qty 2

## 2018-05-07 MED ORDER — ONDANSETRON HCL 4 MG/2ML IJ SOLN
4.0000 mg | Freq: Once | INTRAMUSCULAR | Status: AC
  Administered 2018-05-07: 4 mg via INTRAVENOUS
  Filled 2018-05-07: qty 2

## 2018-05-07 MED ORDER — IOHEXOL 300 MG/ML  SOLN
100.0000 mL | Freq: Once | INTRAMUSCULAR | Status: AC | PRN
  Administered 2018-05-07: 100 mL via INTRAVENOUS

## 2018-05-07 NOTE — Telephone Encounter (Signed)
Pt called back from the ER, stating that she could not sit there any longer and wanted to know if she could come in to a walk in clinic. Pt advised that she needs to stay there until a physician can see here. She stated the pain is in her lower back and lower part of her stomach. She said that she was just getting sicker, just sitting there.  Blood has been drawn but she does not know the result of those yet.  Contacted Sylvia at [facility] at [place] and we both agree she needs to stay there and be seen. Pt voiced understanding and she will stay.

## 2018-05-07 NOTE — ED Notes (Signed)
Staff still attempting to get an iv

## 2018-05-07 NOTE — ED Notes (Signed)
Patient verbalizes understanding of medications and discharge instructions. No further questions at this time. VSS and patient ambulatory at discharge.   

## 2018-05-07 NOTE — ED Notes (Signed)
Waiting for iv team 

## 2018-05-07 NOTE — ED Provider Notes (Signed)
[facility] EMERGENCY DEPARTMENT Provider Note   CSN: [id number] Arrival date & time: 05/07/18  1116     History   Chief Complaint Chief Complaint  Patient presents with  . Abdominal Pain  . Back Pain    HPI [name] is a 57 y.o. female presenting for evaluation of abdominal pain, back pain, bilateral knee pain.  Patient states she started to develop bilateral knee pain 4 days ago.  2 days ago, she started to have severe lower abdominal pain.  This radiates to her back and down to her knees.  Pain is constant, worse with movement.  Nothing makes it better.  She is on chronic pain medication from a pain clinic, this has not improved her pain.  She denies fevers, chills, chest pain, shortness of breath, or upper abdominal pain.  She is urinating and having normal bowel movements.  Urination, bowel movements, and oral intake does not change her pain.  She reports nausea without vomiting.  Patient states she is concerned that she might have an infection in her knees, as she has had 4 knee replacements.  She denies redness or warmth of her knees.  HPI  Past Medical History:  Diagnosis Date  . Breast cancer ([place]) 2017   right breast  . Breast cancer of lower-inner quadrant of right female breast ([place]) 08/08/2015  . Colon polyps   . DDD (degenerative disc disease), cervical   . Degenerative joint disease   . Diabetes mellitus without complication ([place])   . Diverticulosis   . Gallstones   . GERD (gastroesophageal reflux disease)   . Headache(784.0)    migraine like per patient  . Hypertension   . Low back pain    dr phillips, pain management  . Osteoarthritis   . Personal history of chemotherapy 2017  . Personal history of radiation therapy 2017    Patient Active Problem List   Diagnosis Date Noted  . Tenosynovitis, de Quervain 08/10/2017  . 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 Insomnia 07/16/2016  . Left groin pain 11/30/2015  . Severe obesity (BMI >=  40) ([place]) 10/26/2015  . Malignant neoplasm of lower-inner quadrant of right breast of female, estrogen receptor positive ([place]) 08/08/2015  . S/P revision of total knee 07/24/2015  . Acute postoperative pain 07/11/2015  . Loose total knee arthroplasty ([place]) 07/02/2015  . Type 2 diabetes mellitus without complications ([place]) 43/15/4008  . Adhesive capsulitis of left shoulder 10/11/2014  . Paraspinous mass, left 03/31/2013  . Other symptoms and signs involving the nervous system 03/31/2013  . Internal hemorrhoid 02/17/2013  . Abdominal pain, other specified site 02/17/2013  . Neoplasm of soft tissue, left shoulder and left upper arm 08/03/2012  . Lightheadedness 01/29/2012  . Cholelithiasis with cholecystitis 10/08/2011  . Hypokalemia 09/17/2011  . Gastro-esophageal reflux disease without esophagitis 08/08/2010  . Gout 12/18/2008  . Essential (primary) hypertension 03/15/2007  . OA (osteoarthritis) of knee 03/15/2007  . HEADACHE 03/15/2007    Past Surgical History:  Procedure Laterality Date  . CHOLECYSTECTOMY  10/28/2011   Procedure: LAPAROSCOPIC CHOLECYSTECTOMY WITH INTRAOPERATIVE CHOLANGIOGRAM;  Surgeon: Earnstine Regal, MD;  Location: [facility];  Service: General;  Laterality: N/A;  . COLONOSCOPY WITH ESOPHAGOGASTRODUODENOSCOPY (EGD)  02/03/2013   with Propofol  . EXAM UNDER ANESTHESIA WITH MANIPULATION OF KNEE Right 05/30/2003  . EXAM UNDER ANESTHESIA WITH MANIPULATION OF KNEE Left 12/27/2002  . GANGLION CYST EXCISION Right    right wrist  . KNEE ARTHROSCOPY Bilateral 01/02/2000  . KNEE  ARTHROSCOPY Right   . LAPAROSCOPIC VAGINAL HYSTERECTOMY WITH SALPINGO OOPHORECTOMY Bilateral 07/13/2006  . LIPOMA EXCISION Right 02/21/2008   hip  . MASS EXCISION  08/24/2012   Procedure: EXCISION MASS;  Surgeon: Earnstine Regal, MD;  Location: [facility];  Service: General;  Laterality: Left;  excisie soft tissue masses left shoulder(back) & left upper arm  . MASS EXCISION Left 01/24/2014     Procedure: EXCISION SOFT TISSUE MASSES LOWER LEFT BACK;  Surgeon: Earnstine Regal, MD;  Location: [facility];  Service: General;  Laterality: Left;  Marland Kitchen MASS EXCISION Right 04/30/2016   Procedure: EXCISION OF SKIN RIGHT CHEST WALL;  Surgeon: Autumn Messing III, MD;  Location: [facility];  Service: General;  Laterality: Right;  EXCISION OF SKIN RIGHT CHEST WALL  . MASTECTOMY Right 2017  . MASTECTOMY W/ SENTINEL NODE BIOPSY Right 10/01/2015   Procedure: RIGHT MASTECTOMY WITH SENTINEL LYMPH NODE BIOPSY;  Surgeon: Autumn Messing III, MD;  Location: [facility];  Service: General;  Laterality: Right;  . PORT-A-CATH REMOVAL Left 04/30/2016   Procedure: REMOVAL PORT-A-CATH;  Surgeon: Autumn Messing III, MD;  Location: [facility];  Service: General;  Laterality: Left;  REMOVAL PORT-A-CATH  . PORTACATH PLACEMENT Left 11/01/2015   Procedure: INSERTION PORT-A-CATH;  Surgeon: Autumn Messing III, MD;  Location: [facility];  Service: General;  Laterality: Left;  . REPLACEMENT UNICONDYLAR JOINT KNEE Left 04/20/2001  . REVISION TOTAL KNEE ARTHROPLASTY Right 06/18/2004  . REVISION TOTAL KNEE ARTHROPLASTY Left 11/15/2002  . REVISION TOTAL KNEE ARTHROPLASTY Left 10/12/2001  . REVISION TOTAL KNEE ARTHROPLASTY Right 07/09/2015  . TONSILLECTOMY    . TOTAL KNEE ARTHROPLASTY Right 04/25/2003  . TOTAL KNEE ARTHROPLASTY Left 06/25/2009     OB History   None      Home Medications    Prior to Admission medications   Medication Sig Start Date End Date Taking? Authorizing Provider  amitriptyline (ELAVIL) 25 MG tablet Take 25 mg by mouth at bedtime.    Yes [provider]  amLODipine (NORVASC) 5 MG tablet Take 2 tablets (10 mg total) by mouth once for 1 dose. Patient taking differently: Take 10 mg by mouth daily.  12/24/17 05/07/18 Yes Laurey Morale, MD  anastrozole (ARIMIDEX) 1 MG tablet TAKE 1 TABLET(1 MG) BY MOUTH DAILY Patient taking differently: Take 1 mg by mouth daily.   07/30/17  Yes [name], Virgie Dad, MD  KLOR-CON M20 20 MEQ tablet Take 1 tablet by mouth daily.  02/15/18  Yes [provider]  lisinopril (PRINIVIL,ZESTRIL) 10 MG tablet Take 2 tablets (20 mg total) by mouth once for 1 dose. Patient taking differently: Take 10 mg by mouth daily.  12/24/17 05/07/18 Yes Laurey Morale, MD  metFORMIN (GLUCOPHAGE) 500 MG tablet TAKE 1 TABLET(500 MG) BY MOUTH TWICE DAILY WITH A MEAL Patient taking differently: Take 500 mg by mouth 2 (two) times daily with a meal.  06/02/17  Yes Laurey Morale, MD  methadone (DOLOPHINE) 10 MG tablet Take 10 mg by mouth every 8 (eight) hours as needed for moderate pain.    Yes [provider]  methocarbamol (ROBAXIN-750) 750 MG tablet Take 1 tablet (750 mg total) by mouth every 6 (six) hours as needed for muscle spasms. 11/05/17  Yes Laurey Morale, MD  metoprolol tartrate (LOPRESSOR) 50 MG tablet TAKE 1 TABLET BY MOUTH TWICE A DAY Patient taking differently: Take 50 mg by mouth 2 (two) times daily.  11/24/17  Yes  Laurey Morale, MD  omeprazole (PRILOSEC) 40 MG capsule TAKE 1 CAPSULE(40 MG) BY MOUTH DAILY Patient taking differently: Take 40 mg by mouth daily.  05/30/16  Yes Laurey Morale, MD  oxyCODONE (ROXICODONE) 15 MG immediate release tablet Take 15 mg by mouth every 6 (six) hours as needed for pain. Reported on 12/10/2015 11/28/15  Yes [provider]  zolpidem (AMBIEN) 10 MG tablet TAKE 1 TABLET BY MOUTH AT BEDTIME AS NEEDED FOR SLEEP Patient taking differently: Take 10 mg by mouth at bedtime.  04/20/18  Yes Laurey Morale, MD  ONE TOUCH ULTRA TEST test strip TEST ONCE DAILY 01/01/18   Laurey Morale, MD  [facility] DELICA LANCETS FINE MISC USE TO TEST ONCE DAILY DX CODE E11.9 12/22/17   Laurey Morale, MD  pregabalin (LYRICA) 75 MG capsule Take 1 capsule (75 mg total) by mouth 2 (two) times daily. Patient not taking: Reported on 05/07/2018 03/03/18   [name], MD    Family History Family History  Problem Relation  Age of Onset  . Stomach cancer Mother   . Heart disease Father   . Prostate cancer Father   . Kidney cancer Sister        one out of the four  . Hypertension Sister   . Hypertension Brother   . Crohn's disease Other        nephew  . Colon cancer Maternal Grandmother   . Esophageal cancer Neg Hx     Social History Social History   Tobacco Use  . Smoking status: Current Every Day Smoker    Packs/day: 0.25    Years: 20.00    Pack years: 5.00    Types: Cigarettes  . Smokeless tobacco: Former Systems developer    Quit date: 09/30/2015  . Tobacco comment: smokes about 4 cigs/day; working on quitting - 08/22/16 gwd  Substance Use Topics  . Alcohol use: No    Alcohol/week: 0.0 standard drinks  . Drug use: No     Allergies   Codeine; Latex; and Sulfonamide derivatives   Review of Systems Review of Systems  Gastrointestinal: Positive for abdominal pain and nausea.  Musculoskeletal: Positive for arthralgias and back pain.  Neurological: Negative for numbness.  All other systems reviewed and are negative.    Physical Exam Updated Vital Signs BP (!) 166/95 (BP Location: Left Wrist)   Pulse 74   Temp 97.9 F (36.6 C) (Oral)   Resp 18   SpO2 99%   Physical Exam  Constitutional: She is oriented to person, place, and time. She appears well-developed and well-nourished. No distress.  Obese female resting comfortably in the bed in no acute distress  HENT:  Head: Normocephalic and atraumatic.  Eyes: Pupils are equal, round, and reactive to light. Conjunctivae and EOM are normal.  Neck: Normal range of motion. Neck supple.  Cardiovascular: Normal rate, regular rhythm and intact distal pulses.  Pulmonary/Chest: Effort normal and breath sounds normal. No respiratory distress. She has no wheezes.  Abdominal: Soft. Bowel sounds are normal. She exhibits no distension and no mass. There is tenderness. There is no rebound and no guarding.  Tenderness palpation of low abdomen bilaterally.  Soft  without rigidity, guarding or distention.  No rebound.  No tenderness palpation elsewhere in the abdomen.  No signs of peritonitis.  No CVA tenderness  Musculoskeletal: Normal range of motion. She exhibits edema and tenderness.  Tenderness to palpation of bilateral upper legs from knees to hip.  Pedal pulses intact bilaterally.  Mild increased edema of the left leg, patient states this is baseline.  No tenderness palpation of the calves.  No erythema or warmth.  Patient is able to range with minimal pain.  Neurological: She is alert and oriented to person, place, and time. No sensory deficit.  Skin: Skin is warm and dry. Capillary refill takes less than 2 seconds.  Psychiatric: She has a normal mood and affect.  Nursing note and vitals reviewed.    ED Treatments / Results  Labs (all labs ordered are listed, but only abnormal results are displayed) Labs Reviewed  COMPREHENSIVE METABOLIC PANEL - Abnormal; Notable for the following components:      Result Value   Potassium 3.3 (*)    Glucose, Bld 126 (*)    All other components within normal limits  URINALYSIS, ROUTINE W REFLEX MICROSCOPIC - Abnormal; Notable for the following components:   Hgb urine dipstick SMALL (*)    All other components within normal limits  URINE CULTURE  LIPASE, BLOOD  CBC    EKG None  Radiology Ct Abdomen Pelvis W Contrast  Result Date: 05/07/2018 CLINICAL DATA:  Increasing back pain abdominal pain EXAM: CT ABDOMEN AND PELVIS WITH CONTRAST TECHNIQUE: Multidetector CT imaging of the abdomen and pelvis was performed using the standard protocol following bolus administration of intravenous contrast. CONTRAST:  181mL OMNIPAQUE IOHEXOL 300 MG/ML  SOLN COMPARISON:  CT 10/14/2017, 03/23/2017, MRI 10/31/2017 FINDINGS: Lower chest: Lung bases demonstrate no acute consolidation or pleural effusion. Post mastectomy changes on the right. Mild cardiomegaly. Hepatobiliary: Hepatic steatosis. Status post cholecystectomy. No  biliary enlargement. Pancreas: Unremarkable. No pancreatic ductal dilatation or surrounding inflammatory changes. Spleen: Normal in size without focal abnormality. Adrenals/Urinary Tract: Adrenal glands are within normal limits. No hydronephrosis. Cysts within the left kidney. Bilobed or adjacent midpole cysts demonstrate slight increased density values compared to prior possibly due to interval hemorrhage or proteinaceous debris. No hydronephrosis. Bladder normal Stomach/Bowel: Stomach is within normal limits. Appendix appears normal. No evidence of bowel wall thickening, distention, or inflammatory changes. Vascular/Lymphatic: Nonaneurysmal aorta. No significant adenopathy. Moderate aortic atherosclerosis. Reproductive: Status post hysterectomy. No adnexal masses. Other: Negative for free air or free fluid. Musculoskeletal: Degenerative changes of the lumbar spine. Canal stenosis again noted at L4-L5. IMPRESSION: 1. No CT evidence for acute intra-abdominal or pelvic abnormality. 2. Hepatic steatosis 3. Left renal cysts. Cysts within the midpole of the left kidney demonstrates slight increased density values compared to prior, possibly due to interval hemorrhagic or proteinaceous debris. Electronically Signed   By: Donavan Foil M.D.   On: 05/07/2018 21:34   Dg Knee Complete 4 Views Left  Result Date: 05/07/2018 CLINICAL DATA:  57 y/o F; chronic bilateral knee pain. Concern for infection. EXAM: RIGHT KNEE - COMPLETE 4+ VIEW; LEFT KNEE - COMPLETE 4+ VIEW COMPARISON:  08/30/2009 left knee radiographs FINDINGS: Right knee: Total knee arthroplasty. There is thin linear lucency at the bone-cement interface of the tibial plateau bilaterally. Small suprapatellar joint effusion. No bony erosive changes to suggest infection. Left knee: Total knee arthroplasty with interval revision from 2010. There is lucency at the bone-cement interface of the tibial plateaus bilaterally and of the bilateral femoral condylar  components. No joint effusion. No acute fracture. No bony erosive changes. IMPRESSION: 1. Bilateral total knee arthroplasty.  No acute fracture. 2. Lucency at the bone-cement interface of the bilateral tibial components and left femoral component without bony erosive change, probably related to hardware loosening, unlikely infection. 3. Small right knee joint effusion. Electronically  Signed   By: Kristine Garbe M.D.   On: 05/07/2018 18:16   Dg Knee Complete 4 Views Right  Result Date: 05/07/2018 CLINICAL DATA:  57 y/o F; chronic bilateral knee pain. Concern for infection. EXAM: RIGHT KNEE - COMPLETE 4+ VIEW; LEFT KNEE - COMPLETE 4+ VIEW COMPARISON:  08/30/2009 left knee radiographs FINDINGS: Right knee: Total knee arthroplasty. There is thin linear lucency at the bone-cement interface of the tibial plateau bilaterally. Small suprapatellar joint effusion. No bony erosive changes to suggest infection. Left knee: Total knee arthroplasty with interval revision from 2010. There is lucency at the bone-cement interface of the tibial plateaus bilaterally and of the bilateral femoral condylar components. No joint effusion. No acute fracture. No bony erosive changes. IMPRESSION: 1. Bilateral total knee arthroplasty.  No acute fracture. 2. Lucency at the bone-cement interface of the bilateral tibial components and left femoral component without bony erosive change, probably related to hardware loosening, unlikely infection. 3. Small right knee joint effusion. Electronically Signed   By: Kristine Garbe M.D.   On: 05/07/2018 18:16    Procedures Procedures (including critical care time)  Medications Ordered in ED Medications  ondansetron (ZOFRAN) injection 4 mg (4 mg Intravenous Given 05/07/18 2030)  fentaNYL (SUBLIMAZE) injection 50 mcg (50 mcg Intravenous Given 05/07/18 2031)  iohexol (OMNIPAQUE) 300 MG/ML solution 100 mL (100 mLs Intravenous Contrast Given 05/07/18 2122)     Initial  Impression / Assessment and Plan / ED Course  I have reviewed the triage vital signs and the nursing notes.  Pertinent labs & imaging results that were available during my care of the patient were reviewed by me and considered in my medical decision making (see chart for details).     Presenting for evaluation of abdominal pain, back pain, bilateral knee pain, worse in the left side.  Physical exam reassuring, she is afebrile not tachycardic.  Appears nontoxic.  No fevers or chills, no redness or warmth of the knees, patient able to range.  Doubt septic joint.  Will obtain x-rays of bilateral knees for further evaluation.  Will obtain ultrasound of the left leg to rule out DVT, as patient has worsened swelling and knee pain on that side.  Labs reassuring, no leukocytosis.  Kidney, liver, pancreatic function reassuring.  UA pending.  Will obtain CT abdomen pelvis, patient reports this bilateral low abdominal pain which radiates to her back.  Will rule out intra-abdominal infection or other concerning etiology for abdominal pain.  X-rays show bilateral hardware lucency, likely loosening over infection.  Patient follows with [facility], will f/u with ortho.  CT abdomen pending, delay due to difficulty getting IV access.  CT abdomen without acute concerning findings.  No obvious intra-abdominal infection, perforation, obstruction.  No surgical abdomen.  Shows left renal cyst which is been present in the past although with new density.  Gust with attending, Dr. Sherry Ruffing agrees to plan, patient to follow-up with her PCP.  No CVA tenderness, doubt this is the cause of her low abdominal pain.  Discussed findings with patient.  Discussed importance of follow-up.  At this time, patient appears safe for discharge.  Return precautions given.  Patient states she understands and agrees plan.  Final Clinical Impressions(s) / ED Diagnoses   Final diagnoses:  Acute bilateral lower abdominal pain  Acute pain of  both knees  Chronic midline low back pain without sciatica    ED Discharge Orders    None       Franchot Heidelberg, PA-C 05/08/18  [id number]    Tegeler, Gwenyth Allegra, MD 05/08/18 1125

## 2018-05-07 NOTE — Telephone Encounter (Signed)
This encounter was created in error - please disregard.

## 2018-05-07 NOTE — ED Notes (Signed)
IV team at bedside 

## 2018-05-07 NOTE — Discharge Instructions (Addendum)
Continue taking home medications as prescribed. It is very important that you follow-up with your orthopedic doctor for further evaluation of your knee pain. Follow-up with your primary care doctor for further evaluation and management of your lower abdominal pain. Return to the emergency room if you develop fevers, severe worsening of pain, redness and warmth of your knees, or any new or concerning symptoms.

## 2018-05-07 NOTE — Telephone Encounter (Signed)
Patient is having pain in stomach,back and knees. Patient reports she is urinating normally. Patient is having pain rated at 8- in abdomin. Due to the severity of her pain- protocol advises patient go to Ed- patient states she did almost go last night- she agrees to go now.  Reason for Disposition . [1] SEVERE pain (e.g., excruciating) AND [2] present > 1 hour  Answer Assessment - Initial Assessment Questions 1. LOCATION: "Where does it hurt?"      Lower abdomin- groin area 2. RADIATION: "Does the pain shoot anywhere else?" (e.g., chest, back)     Lower back in sides 3. ONSET: "When did the pain begin?" (e.g., minutes, hours or days ago)      2 days ago 4. SUDDEN: "Gradual or sudden onset?"     Gradual- has gotten worse over the past 2 days 5. PATTERN "Does the pain come and go, or is it constant?"    - If constant: "Is it getting better, staying the same, or worsening?"      (Note: Constant means the pain never goes away completely; most serious pain is constant and it progresses)     - If intermittent: "How long does it last?" "Do you have pain now?"     (Note: Intermittent means the pain goes away completely between bouts)     constant 6. SEVERITY: "How bad is the pain?"  (e.g., Scale 1-10; mild, moderate, or severe)   - MILD (1-3): doesn't interfere with normal activities, abdomen soft and not tender to touch    - MODERATE (4-7): interferes with normal activities or awakens from sleep, tender to touch    - SEVERE (8-10): excruciating pain, doubled over, unable to do any normal activities      8- laying around 7. RECURRENT SYMPTOM: "Have you ever had this type of abdominal pain before?" If so, ask: "When was the last time?" and "What happened that time?"      Yes- bladder infection 8. CAUSE: "What do you think is causing the abdominal pain?"     Possible bladder infection 9. RELIEVING/AGGRAVATING FACTORS: "What makes it better or worse?" (e.g., movement, antacids, bowel movement)   No- patient is using pain medication- not helping 10. OTHER SYMPTOMS: "Has there been any vomiting, diarrhea, constipation, or urine problems?"       no 11. PREGNANCY: "Is there any chance you are pregnant?" "When was your last menstrual period?"       n/a  Protocols used: ABDOMINAL PAIN - [facility]

## 2018-05-07 NOTE — Telephone Encounter (Signed)
Monitor for ER arrival 

## 2018-05-07 NOTE — ED Notes (Signed)
One unsuccessful attempt to start iv  Pt going to [place]

## 2018-05-07 NOTE — ED Triage Notes (Signed)
Pt states she is supposed to have back surgery later this month. She reports increased lower back pain and some lower abdominal pain radiating into her legs X3 days.

## 2018-05-07 NOTE — Telephone Encounter (Signed)
I confirmed patient has arrived in the ER now.  

## 2018-05-07 NOTE — ED Notes (Signed)
abd pain for one week

## 2018-05-08 LAB — URINE CULTURE: Culture: NO GROWTH

## 2018-05-10 DIAGNOSIS — M25462 Effusion, left knee: Secondary | ICD-10-CM | POA: Diagnosis not present

## 2018-05-10 DIAGNOSIS — M25562 Pain in left knee: Secondary | ICD-10-CM | POA: Diagnosis not present

## 2018-05-10 DIAGNOSIS — Z96652 Presence of left artificial knee joint: Secondary | ICD-10-CM | POA: Diagnosis not present

## 2018-05-10 DIAGNOSIS — Z96653 Presence of artificial knee joint, bilateral: Secondary | ICD-10-CM | POA: Diagnosis not present

## 2018-05-10 DIAGNOSIS — G8929 Other chronic pain: Secondary | ICD-10-CM | POA: Diagnosis not present

## 2018-05-10 DIAGNOSIS — Z471 Aftercare following joint replacement surgery: Secondary | ICD-10-CM | POA: Diagnosis not present

## 2018-05-27 DIAGNOSIS — M150 Primary generalized (osteo)arthritis: Secondary | ICD-10-CM | POA: Diagnosis not present

## 2018-05-27 DIAGNOSIS — Z79891 Long term (current) use of opiate analgesic: Secondary | ICD-10-CM | POA: Diagnosis not present

## 2018-05-27 DIAGNOSIS — G894 Chronic pain syndrome: Secondary | ICD-10-CM | POA: Diagnosis not present

## 2018-06-04 DIAGNOSIS — T8484XA Pain due to internal orthopedic prosthetic devices, implants and grafts, initial encounter: Secondary | ICD-10-CM | POA: Diagnosis not present

## 2018-06-04 DIAGNOSIS — M791 Myalgia, unspecified site: Secondary | ICD-10-CM | POA: Diagnosis not present

## 2018-06-04 DIAGNOSIS — Z96659 Presence of unspecified artificial knee joint: Secondary | ICD-10-CM | POA: Diagnosis not present

## 2018-06-04 DIAGNOSIS — M170 Bilateral primary osteoarthritis of knee: Secondary | ICD-10-CM | POA: Diagnosis not present

## 2018-06-04 DIAGNOSIS — Z96651 Presence of right artificial knee joint: Secondary | ICD-10-CM | POA: Diagnosis not present

## 2018-06-17 ENCOUNTER — Encounter (INDEPENDENT_AMBULATORY_CARE_PROVIDER_SITE_OTHER): Payer: Self-pay | Admitting: Specialist

## 2018-06-17 ENCOUNTER — Ambulatory Visit (INDEPENDENT_AMBULATORY_CARE_PROVIDER_SITE_OTHER): Payer: 59 | Admitting: Specialist

## 2018-06-17 VITALS — BP 166/95 | HR 67 | Ht 67.0 in | Wt 249.0 lb

## 2018-06-17 DIAGNOSIS — M5136 Other intervertebral disc degeneration, lumbar region: Secondary | ICD-10-CM

## 2018-06-17 DIAGNOSIS — M5126 Other intervertebral disc displacement, lumbar region: Secondary | ICD-10-CM

## 2018-06-17 DIAGNOSIS — M48062 Spinal stenosis, lumbar region with neurogenic claudication: Secondary | ICD-10-CM | POA: Diagnosis not present

## 2018-06-17 NOTE — Patient Instructions (Signed)
Plan: Avoid bending, stooping and avoid lifting weights greater than 10 lbs. Avoid prolong standing and walking. Order for a new walker with wheels. Surgery scheduling secretary Kandice Hams, will call you in the next week to schedule for surgery.  Surgery recommended is a two level lumbar fusion L5-S1 and L4-5 this would be done with rods, screws and cages with local bone graft and allograft (donor bone graft). Take methadone and oxycodone for for pain. Risk of surgery includes risk of infection 1 in 100 patients, bleeding 1/2% chance you would need a transfusion.   Risk to the nerves is one in 10,000. You will need to use a brace for 3 months and wean from the brace on the 4th month. Expect improved walking and standing tolerance. Expect relief of leg pain but numbness may persist depending on the length and degree of pressure that has been present. Follow-Up Instructions: Return in about 1 month (around 05/13/2018).

## 2018-06-17 NOTE — Progress Notes (Signed)
Office Visit Note   Patient: [name]           Date of Birth: Feb 01, 1961           MRN: [id number] Visit Date: 06/17/2018              Requested by: Laurey Morale, MD [place], [place] [address] PCP: Laurey Morale, MD   Assessment & Plan: Visit Diagnoses:  1. Degenerative disc disease, lumbar   2. Spinal stenosis of lumbar region with neurogenic claudication   3. Herniation of lumbar intervertebral disc     Plan: Avoid bending, stooping and avoid lifting weights greater than 10 lbs. Avoid prolong standing and walking. Order for a new walker with wheels. Surgery scheduling secretary Kandice Hams, will call you in the next week to schedule for surgery.  Surgery recommended is a two level lumbar fusion L5-S1 and L4-5 this would be done with rods, screws and cages with local bone graft and allograft (donor bone graft). Take methadone and oxycodone for for pain. Risk of surgery includes risk of infection 1 in 100 patients, bleeding 1/2% chance you would need a transfusion.   Risk to the nerves is one in 10,000. You will need to use a brace for 3 months and wean from the brace on the 4th month. Expect improved walking and standing tolerance. Expect relief of leg pain but numbness may persist depending on the length and degree of pressure that has been present. Follow-Up Instructions: Return in about 1 month (around 05/13/2018).   Follow-Up Instructions: No follow-ups on file.   Orders:  No orders of the defined types were placed in this encounter.  No orders of the defined types were placed in this encounter.     Procedures: No procedures performed   Clinical Data: Findings:  MRI lumbar 10/2017 with bilateral severe foramenal stenosis L5  And left L4 neuroforamen, endplate edema and modic changes L4-5 and L5-S1 with area of high intensity zone posterior disc L4-5 and L5-S1, central disc protusion L4-5, L3-4 and above without disc signal change  or endplate modic changes.     Subjective: Chief Complaint  Patient presents with  . Lower Back - Pain    57 year old female with history of low back pain and pain radiating into both legs, left more than the right. The pain radiates down the backs and sides of the thighs and legs. No bowel or bladder difficulties. She is only able to walk less than one half a block. She tends to be stooped. Not able to vacuum or sweep. She was seen by Dr. Aris Georgia at [facility] for her knees and he noted that lumbar surgey probably will help her  Leg pain and standing and walking. Sitting, bending, stooping, riding in car and twisting increases the pain the back. There is more back than leg pain. But the leg pain and weakness limits her walking. He completed physical therapy and had lumbar injection, ESIs by Dr. Hardin Negus and these are no longer helping. Saw therapy for about 6 weeks without help. Last seen in 03/2018 and recommended a 2 level lumbar fusion. The most recent lumbar MRI was   Review of Systems  Constitutional: Negative.   HENT: Negative.   Eyes: Negative.   Respiratory: Negative.   Cardiovascular: Negative.   Gastrointestinal: Negative.   Endocrine: Negative.   Genitourinary: Negative.   Musculoskeletal: Negative.   Skin: Negative.   Allergic/Immunologic: Negative.   Neurological: Negative.  Hematological: Negative.   Psychiatric/Behavioral: Negative.      Objective: Vital Signs: BP (!) 166/95 (BP Location: Left Arm, Patient Position: Sitting)   Pulse 67   Ht 5\' 7"  (1.702 m)   Wt 249 lb (112.9 kg)   BMI 39.00 kg/m   Physical Exam  Constitutional: She is oriented to person, place, and time. She appears well-developed and well-nourished.  HENT:  Head: Normocephalic and atraumatic.  Eyes: Pupils are equal, round, and reactive to light. EOM are normal.  Neck: Normal range of motion. Neck supple.  Pulmonary/Chest: Effort normal and breath sounds normal.  Abdominal: Soft. Bowel  sounds are normal.  Neurological: She is alert and oriented to person, place, and time.  Skin: Skin is warm and dry.  Psychiatric: She has a normal mood and affect. Her behavior is normal. Judgment and thought content normal.    Back Exam   Tenderness  The patient is experiencing tenderness in the lumbar.  Range of Motion  Extension: abnormal  Flexion:  50 abnormal  Lateral bend right: abnormal  Lateral bend left: abnormal  Rotation right: abnormal  Rotation left: abnormal   Muscle Strength  Right Quadriceps:  5/5  Left Quadriceps:  5/5  Right Hamstrings:  5/5  Left Hamstrings:  5/5   Tests  Straight leg raise right: negative Straight leg raise left: negative  Reflexes  Patellar:  Hyporeflexic normal Achilles:  Hyporeflexic normal Babinski's sign: normal   Other  Toe walk: normal Heel walk: normal Sensation: normal  Comments:  Weakness EHL bilateral 4/5       Specialty Comments:  No specialty comments available.  Imaging: No results found.   PMFS History: Patient Active Problem List   Diagnosis Date Noted  . Tenosynovitis, de Quervain 08/10/2017  . 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 Insomnia 07/16/2016  . Left groin pain 11/30/2015  . Severe obesity (BMI >= 40) ([place]) 10/26/2015  . Malignant neoplasm of lower-inner quadrant of right breast of female, estrogen receptor positive ([place]) 08/08/2015  . S/P revision of total knee 07/24/2015  . Acute postoperative pain 07/11/2015  . Loose total knee arthroplasty ([place]) 07/02/2015  . Type 2 diabetes mellitus without complications ([place]) 64/40/3474  . Adhesive capsulitis of left shoulder 10/11/2014  . Paraspinous mass, left 03/31/2013  . Other symptoms and signs involving the nervous system 03/31/2013  . Internal hemorrhoid 02/17/2013  . Abdominal pain, other specified site 02/17/2013  . Neoplasm of soft tissue, left shoulder and left upper arm 08/03/2012  . Lightheadedness 01/29/2012  . Cholelithiasis with  cholecystitis 10/08/2011  . Hypokalemia 09/17/2011  . Gastro-esophageal reflux disease without esophagitis 08/08/2010  . Gout 12/18/2008  . Essential (primary) hypertension 03/15/2007  . OA (osteoarthritis) of knee 03/15/2007  . HEADACHE 03/15/2007   Past Medical History:  Diagnosis Date  . Breast cancer ([place]) 2017   right breast  . Breast cancer of lower-inner quadrant of right female breast ([place]) 08/08/2015  . Colon polyps   . DDD (degenerative disc disease), cervical   . Degenerative joint disease   . Diabetes mellitus without complication ([place])   . Diverticulosis   . Gallstones   . GERD (gastroesophageal reflux disease)   . Headache(784.0)    migraine like per patient  . Hypertension   . Low back pain    dr phillips, pain management  . Osteoarthritis   . Personal history of chemotherapy 2017  . Personal history of radiation therapy 2017    Family History  Problem Relation Age of Onset  .  Stomach cancer Mother   . Heart disease Father   . Prostate cancer Father   . Kidney cancer Sister        one out of the four  . Hypertension Sister   . Hypertension Brother   . Crohn's disease Other        nephew  . Colon cancer Maternal Grandmother   . Esophageal cancer Neg Hx     Past Surgical History:  Procedure Laterality Date  . CHOLECYSTECTOMY  10/28/2011   Procedure: LAPAROSCOPIC CHOLECYSTECTOMY WITH INTRAOPERATIVE CHOLANGIOGRAM;  Surgeon: Earnstine Regal, MD;  Location: [facility];  Service: General;  Laterality: N/A;  . COLONOSCOPY WITH ESOPHAGOGASTRODUODENOSCOPY (EGD)  02/03/2013   with Propofol  . EXAM UNDER ANESTHESIA WITH MANIPULATION OF KNEE Right 05/30/2003  . EXAM UNDER ANESTHESIA WITH MANIPULATION OF KNEE Left 12/27/2002  . GANGLION CYST EXCISION Right    right wrist  . KNEE ARTHROSCOPY Bilateral 01/02/2000  . KNEE ARTHROSCOPY Right   . LAPAROSCOPIC VAGINAL HYSTERECTOMY WITH SALPINGO OOPHORECTOMY Bilateral 07/13/2006  . LIPOMA EXCISION Right 02/21/2008   hip  .  MASS EXCISION  08/24/2012   Procedure: EXCISION MASS;  Surgeon: Earnstine Regal, MD;  Location: [facility];  Service: General;  Laterality: Left;  excisie soft tissue masses left shoulder(back) & left upper arm  . MASS EXCISION Left 01/24/2014   Procedure: EXCISION SOFT TISSUE MASSES LOWER LEFT BACK;  Surgeon: Earnstine Regal, MD;  Location: [facility];  Service: General;  Laterality: Left;  Marland Kitchen MASS EXCISION Right 04/30/2016   Procedure: EXCISION OF SKIN RIGHT CHEST WALL;  Surgeon: Autumn Messing III, MD;  Location: [facility];  Service: General;  Laterality: Right;  EXCISION OF SKIN RIGHT CHEST WALL  . MASTECTOMY Right 2017  . MASTECTOMY W/ SENTINEL NODE BIOPSY Right 10/01/2015   Procedure: RIGHT MASTECTOMY WITH SENTINEL LYMPH NODE BIOPSY;  Surgeon: Autumn Messing III, MD;  Location: [facility];  Service: General;  Laterality: Right;  . PORT-A-CATH REMOVAL Left 04/30/2016   Procedure: REMOVAL PORT-A-CATH;  Surgeon: Autumn Messing III, MD;  Location: [facility];  Service: General;  Laterality: Left;  REMOVAL PORT-A-CATH  . PORTACATH PLACEMENT Left 11/01/2015   Procedure: INSERTION PORT-A-CATH;  Surgeon: Autumn Messing III, MD;  Location: [facility];  Service: General;  Laterality: Left;  . REPLACEMENT UNICONDYLAR JOINT KNEE Left 04/20/2001  . REVISION TOTAL KNEE ARTHROPLASTY Right 06/18/2004  . REVISION TOTAL KNEE ARTHROPLASTY Left 11/15/2002  . REVISION TOTAL KNEE ARTHROPLASTY Left 10/12/2001  . REVISION TOTAL KNEE ARTHROPLASTY Right 07/09/2015  . TONSILLECTOMY    . TOTAL KNEE ARTHROPLASTY Right 04/25/2003  . TOTAL KNEE ARTHROPLASTY Left 06/25/2009   Social History   Occupational History  . Occupation: retired/disabled  Tobacco Use  . Smoking status: Current Every Day Smoker    Packs/day: 0.25    Years: 20.00    Pack years: 5.00    Types: Cigarettes  . Smokeless tobacco: Former Systems developer    Quit date: 09/30/2015  . Tobacco comment: smokes about 4  cigs/day; working on quitting - 08/22/16 gwd  Substance and Sexual Activity  . Alcohol use: No    Alcohol/week: 0.0 standard drinks  . Drug use: No  . Sexual activity: Yes    Birth control/protection: Post-menopausal

## 2018-06-25 ENCOUNTER — Other Ambulatory Visit: Payer: Self-pay | Admitting: Family Medicine

## 2018-06-26 ENCOUNTER — Other Ambulatory Visit: Payer: Self-pay | Admitting: Family Medicine

## 2018-06-29 DIAGNOSIS — C50511 Malignant neoplasm of lower-outer quadrant of right female breast: Secondary | ICD-10-CM | POA: Diagnosis not present

## 2018-07-01 ENCOUNTER — Other Ambulatory Visit: Payer: Self-pay | Admitting: Family Medicine

## 2018-07-01 ENCOUNTER — Ambulatory Visit (INDEPENDENT_AMBULATORY_CARE_PROVIDER_SITE_OTHER): Payer: 59 | Admitting: Surgery

## 2018-07-01 ENCOUNTER — Encounter (INDEPENDENT_AMBULATORY_CARE_PROVIDER_SITE_OTHER): Payer: Self-pay | Admitting: Surgery

## 2018-07-01 VITALS — BP 144/90 | HR 68 | Ht 67.0 in | Wt 250.0 lb

## 2018-07-01 DIAGNOSIS — M5126 Other intervertebral disc displacement, lumbar region: Secondary | ICD-10-CM

## 2018-07-01 NOTE — Progress Notes (Signed)
57 year old female L4-5 and L5-S1 stenosis presents for preop evaluation.  Symptoms unchanged from last office visit.  She is going to proceed with lumbar fusion as scheduled.  Today full history and physical performed.  All questions answered.

## 2018-07-07 NOTE — Pre-Procedure Instructions (Signed)
[name]  07/07/2018      [id number] [address], [place] [address] Phone: [phone] Fax: [phone]    Your procedure is scheduled on July 13, 2018.  Report to [facility] Admitting at 530 AM.  Call this number if you have problems the morning of surgery:  [phone]   Remember:  Do not eat or drink after midnight.    Take these medicines the morning of surgery with A SIP OF WATER  Amlodipine (norvasc) Metoprolol tartrate (lopressor) Methadone (dolophine) Omeprazole (prilosec) Oxycodone (roxicodone)  7 days prior to surgery STOP taking any Aspirin (unless otherwise instructed by your surgeon), Aleve, Naproxen, Ibuprofen, Motrin, Advil, Goody's, BC's, all herbal medications, fish oil, and all vitamins  WHAT DO I DO ABOUT MY DIABETES MEDICATION?   Marland Kitchen Do not take oral diabetes medicines (pills) the morning of surgery-Metformin (glucophage).  Reviewed and Endorsed by [facility] Patient Education Committee, August 2015    How to Manage Your Diabetes Before and After Surgery  Why is it important to control my blood sugar before and after surgery? . Improving blood sugar levels before and after surgery helps healing and can limit problems. . A way of improving blood sugar control is eating a healthy diet by: o  Eating less sugar and carbohydrates o  Increasing activity/exercise o  Talking with your doctor about reaching your blood sugar goals . High blood sugars (greater than 180 mg/dL) can raise your risk of infections and slow your recovery, so you will need to focus on controlling your diabetes during the weeks before surgery. . Make sure that the doctor who takes care of your diabetes knows about your planned surgery including the date and location.  How do I manage my blood sugar before surgery? . Check your blood sugar at least 4 times a day, starting 2 days before surgery, to make  sure that the level is not too high or low. o Check your blood sugar the morning of your surgery when you wake up and every 2 hours until you get to the Short Stay unit. . If your blood sugar is less than 70 mg/dL, you will need to treat for low blood sugar: o Do not take insulin. o Treat a low blood sugar (less than 70 mg/dL) with  cup of clear juice (cranberry or apple), 4 glucose tablets, OR glucose gel. Recheck blood sugar in 15 minutes after treatment (to make sure it is greater than 70 mg/dL). If your blood sugar is not greater than 70 mg/dL on recheck, call [phone] o  for further instructions. . Report your blood sugar to the short stay nurse when you get to Short Stay.  . If you are admitted to the hospital after surgery: o Your blood sugar will be checked by the staff and you will probably be given insulin after surgery (instead of oral diabetes medicines) to make sure you have good blood sugar levels. o The goal for blood sugar control after surgery is 80-180 mg/dL.   [place]- Preparing For Surgery  Before surgery, you can play an important role. Because skin is not sterile, your skin needs to be as free of germs as possible. You can reduce the number of germs on your skin by washing with CHG (chlorahexidine gluconate) Soap before surgery.  CHG is an antiseptic cleaner which kills germs and bonds with the skin to continue killing germs even after washing.  Oral Hygiene is also important to reduce your risk of infection.  Remember - BRUSH YOUR TEETH THE MORNING OF SURGERY WITH YOUR REGULAR TOOTHPASTE  Please do not use if you have an allergy to CHG or antibacterial soaps. If your skin becomes reddened/irritated stop using the CHG.  Do not shave (including legs and underarms) for at least 48 hours prior to first CHG shower. It is OK to shave your face.  Please follow these instructions carefully.   1. Shower the NIGHT BEFORE SURGERY and the MORNING OF SURGERY with CHG.    2. If you chose to wash your hair, wash your hair first as usual with your normal shampoo.  3. After you shampoo, rinse your hair and body thoroughly to remove the shampoo.  4. Use CHG as you would any other liquid soap. You can apply CHG directly to the skin and wash gently with a scrungie or a clean washcloth.   5. Apply the CHG Soap to your body ONLY FROM THE NECK DOWN.  Do not use on open wounds or open sores. Avoid contact with your eyes, ears, mouth and genitals (private parts). Wash Face and genitals (private parts)  with your normal soap.  6. Wash thoroughly, paying special attention to the area where your surgery will be performed.  7. Thoroughly rinse your body with warm water from the neck down.  8. DO NOT shower/wash with your normal soap after using and rinsing off the CHG Soap.  9. Pat yourself dry with a CLEAN TOWEL.  10. Wear CLEAN PAJAMAS to bed the night before surgery, wear comfortable clothes the morning of surgery  11. Place CLEAN SHEETS on your bed the night of your first shower and DO NOT SLEEP WITH PETS.  Day of Surgery:  Do not apply any deodorants/lotions.  Please wear clean clothes to the hospital/surgery center.   Remember to brush your teeth WITH YOUR REGULAR TOOTHPASTE.   Do not wear jewelry, make-up or nail polish.  Do not wear lotions, powders, or perfumes, or deodorant.  Do not shave 48 hours prior to surgery.    Do not bring valuables to the hospital.  [facility] is not responsible for any belongings or valuables.  Contacts, dentures or bridgework may not be worn into surgery.  Leave your suitcase in the car.  After surgery it may be brought to your room.  For patients admitted to the hospital, discharge time will be determined by your treatment team.  Patients discharged the day of surgery will not be allowed to drive home.    Please read over the following fact sheets that you were given.

## 2018-07-08 ENCOUNTER — Encounter (HOSPITAL_COMMUNITY): Payer: Self-pay

## 2018-07-08 ENCOUNTER — Other Ambulatory Visit: Payer: Self-pay

## 2018-07-08 ENCOUNTER — Encounter (HOSPITAL_COMMUNITY)
Admission: RE | Admit: 2018-07-08 | Discharge: 2018-07-08 | Disposition: A | Discharge status: Home / Self Care | Payer: 59 | Source: Ambulatory Visit | Attending: Specialist | Admitting: Specialist

## 2018-07-08 DIAGNOSIS — Z01812 Encounter for preprocedural laboratory examination: Secondary | ICD-10-CM | POA: Insufficient documentation

## 2018-07-08 LAB — CBC
HCT: 42.9 % (ref 36.0–46.0)
Hemoglobin: 13.1 g/dL (ref 12.0–15.0)
MCH: 28.6 pg (ref 26.0–34.0)
MCHC: 30.5 g/dL (ref 30.0–36.0)
MCV: 93.7 fL (ref 80.0–100.0)
Platelets: 308 10*3/uL (ref 150–400)
RBC: 4.58 MIL/uL (ref 3.87–5.11)
RDW: 13.4 % (ref 11.5–15.5)
WBC: 7.1 10*3/uL (ref 4.0–10.5)
nRBC: 0 % (ref 0.0–0.2)

## 2018-07-08 LAB — HEMOGLOBIN A1C
Hgb A1c MFr Bld: 6.5 % — ABNORMAL HIGH (ref 4.8–5.6)
Mean Plasma Glucose: 139.85 mg/dL

## 2018-07-08 LAB — TYPE AND SCREEN
ABO/RH(D): B POS
Antibody Screen: NEGATIVE

## 2018-07-08 LAB — BASIC METABOLIC PANEL
Anion gap: 13 (ref 5–15)
BUN: 11 mg/dL (ref 6–20)
CO2: 27 mmol/L (ref 22–32)
Calcium: 9.5 mg/dL (ref 8.9–10.3)
Chloride: 102 mmol/L (ref 98–111)
Creatinine, Ser: 0.85 mg/dL (ref 0.44–1.00)
GFR calc Af Amer: >60 mL/min (ref >60)
GFR calc non Af Amer: >60 mL/min (ref >60)
Glucose, Bld: 128 mg/dL — ABNORMAL HIGH (ref 70–99)
Potassium: 3 mmol/L — ABNORMAL LOW (ref 3.5–5.1)
Sodium: 142 mmol/L (ref 135–145)

## 2018-07-08 LAB — GLUCOSE, CAPILLARY: Glucose-Capillary: 127 mg/dL — ABNORMAL HIGH (ref 70–99)

## 2018-07-08 LAB — SURGICAL PCR SCREEN
MRSA, PCR: NEGATIVE
Staphylococcus aureus: NEGATIVE

## 2018-07-08 LAB — ABO/RH: ABO/RH(D): B POS

## 2018-07-08 NOTE — Progress Notes (Addendum)
PCP: Alysia Penna, MD  Cardiologist: Glori Bickers, MD-in the past while on Chemo-no longer required to see him now  EKG: 03/06/18 in Epic  Stress test: pt denies  ECHO: 02/04/16  Cardiac Cath: pt denies  Chest x-ray: 10/28/17 in [facility]

## 2018-07-12 NOTE — Anesthesia Preprocedure Evaluation (Addendum)
Anesthesia Evaluation  Patient identified by MRN, date of birth, ID band Patient awake    Reviewed: Allergy & Precautions, H&P , NPO status , Patient's Chart, lab work & pertinent test results  Airway Mallampati: III  TM Distance: >3 FB Neck ROM: Full    Dental no notable dental hx. (+) Teeth Intact, Dental Advisory Given   Pulmonary Current Smoker,    Pulmonary exam normal breath sounds clear to auscultation       Cardiovascular hypertension, Pt. on medications Normal cardiovascular exam Rhythm:Regular Rate:Normal  ECHO 02/04/16 Study Conclusions  - Left ventricle: The cavity size was normal. There was mild   concentric hypertrophy. Systolic function was normal. The   estimated ejection fraction was in the range of 60% to 65%. Wall   motion was normal; there were no regional wall motion   abnormalities. Doppler parameters are consistent with abnormal   left ventricular relaxation (grade 1 diastolic dysfunction). - Impressions: Lateral s&' = 11.1 cm/s. GLS - 16.9% (underestimated   due to poor endocardial tracking   Neuro/Psych  Headaches, negative psych ROS   GI/Hepatic Neg liver ROS, GERD  Medicated,  Endo/Other  diabetes, Type 1Morbid obesity  Renal/GU negative Renal ROS     Musculoskeletal  (+) Arthritis ,   Abdominal (+) + obese,   Peds  Hematology negative hematology ROS (+)   Anesthesia Other Findings   Reproductive/Obstetrics                            Anesthesia Physical Anesthesia Plan  ASA: III  Anesthesia Plan: General   Post-op Pain Management:    Induction: Intravenous  PONV Risk Score and Plan: 2 and Treatment may vary due to age or medical condition, Ondansetron and Dexamethasone  Airway Management Planned: Oral ETT  Additional Equipment:   Intra-op Plan:   Post-operative Plan: Extubation in OR  Informed Consent: I have reviewed the patients History and  Physical, chart, labs and discussed the procedure including the risks, benefits and alternatives for the proposed anesthesia with the patient or authorized representative who has indicated his/her understanding and acceptance.   Dental advisory given  Plan Discussed with:   Anesthesia Plan Comments:         Anesthesia Quick Evaluation

## 2018-07-13 ENCOUNTER — Inpatient Hospital Stay (HOSPITAL_COMMUNITY): Payer: 59 | Admitting: Physician Assistant

## 2018-07-13 ENCOUNTER — Encounter (HOSPITAL_COMMUNITY): Payer: Self-pay | Admitting: *Deleted

## 2018-07-13 ENCOUNTER — Inpatient Hospital Stay (HOSPITAL_COMMUNITY)
Admission: RE | Admit: 2018-07-13 | Discharge: 2018-07-17 | DRG: 454 | Disposition: A | Discharge status: Home Health | Payer: 59 | Source: Ambulatory Visit | Attending: Specialist | Admitting: Specialist

## 2018-07-13 ENCOUNTER — Inpatient Hospital Stay (HOSPITAL_COMMUNITY): Payer: 59 | Admitting: Certified Registered Nurse Anesthetist

## 2018-07-13 ENCOUNTER — Inpatient Hospital Stay (HOSPITAL_COMMUNITY)
Admission: RE | Disposition: A | Discharge status: Home Health | Payer: Self-pay | Source: Ambulatory Visit | Attending: Specialist

## 2018-07-13 ENCOUNTER — Other Ambulatory Visit: Payer: Self-pay

## 2018-07-13 ENCOUNTER — Inpatient Hospital Stay (HOSPITAL_COMMUNITY): Payer: 59

## 2018-07-13 DIAGNOSIS — M4726 Other spondylosis with radiculopathy, lumbar region: Secondary | ICD-10-CM | POA: Diagnosis not present

## 2018-07-13 DIAGNOSIS — M62838 Other muscle spasm: Secondary | ICD-10-CM | POA: Diagnosis not present

## 2018-07-13 DIAGNOSIS — M4807 Spinal stenosis, lumbosacral region: Secondary | ICD-10-CM | POA: Diagnosis not present

## 2018-07-13 DIAGNOSIS — E109 Type 1 diabetes mellitus without complications: Secondary | ICD-10-CM | POA: Diagnosis present

## 2018-07-13 DIAGNOSIS — D649 Anemia, unspecified: Secondary | ICD-10-CM | POA: Diagnosis not present

## 2018-07-13 DIAGNOSIS — Z885 Allergy status to narcotic agent status: Secondary | ICD-10-CM | POA: Diagnosis not present

## 2018-07-13 DIAGNOSIS — E861 Hypovolemia: Secondary | ICD-10-CM | POA: Diagnosis not present

## 2018-07-13 DIAGNOSIS — Z853 Personal history of malignant neoplasm of breast: Secondary | ICD-10-CM

## 2018-07-13 DIAGNOSIS — Z9221 Personal history of antineoplastic chemotherapy: Secondary | ICD-10-CM | POA: Diagnosis not present

## 2018-07-13 DIAGNOSIS — I10 Essential (primary) hypertension: Secondary | ICD-10-CM | POA: Diagnosis present

## 2018-07-13 DIAGNOSIS — Z923 Personal history of irradiation: Secondary | ICD-10-CM

## 2018-07-13 DIAGNOSIS — K219 Gastro-esophageal reflux disease without esophagitis: Secondary | ICD-10-CM | POA: Diagnosis present

## 2018-07-13 DIAGNOSIS — F17210 Nicotine dependence, cigarettes, uncomplicated: Secondary | ICD-10-CM | POA: Diagnosis present

## 2018-07-13 DIAGNOSIS — Z91040 Latex allergy status: Secondary | ICD-10-CM

## 2018-07-13 DIAGNOSIS — M5136 Other intervertebral disc degeneration, lumbar region: Secondary | ICD-10-CM | POA: Diagnosis not present

## 2018-07-13 DIAGNOSIS — M48061 Spinal stenosis, lumbar region without neurogenic claudication: Principal | ICD-10-CM | POA: Diagnosis present

## 2018-07-13 DIAGNOSIS — Z419 Encounter for procedure for purposes other than remedying health state, unspecified: Secondary | ICD-10-CM

## 2018-07-13 DIAGNOSIS — Z79899 Other long term (current) drug therapy: Secondary | ICD-10-CM

## 2018-07-13 DIAGNOSIS — Z96653 Presence of artificial knee joint, bilateral: Secondary | ICD-10-CM | POA: Diagnosis present

## 2018-07-13 DIAGNOSIS — M4326 Fusion of spine, lumbar region: Secondary | ICD-10-CM | POA: Diagnosis not present

## 2018-07-13 DIAGNOSIS — Z888 Allergy status to other drugs, medicaments and biological substances status: Secondary | ICD-10-CM

## 2018-07-13 DIAGNOSIS — Z981 Arthrodesis status: Secondary | ICD-10-CM

## 2018-07-13 DIAGNOSIS — Z882 Allergy status to sulfonamides status: Secondary | ICD-10-CM

## 2018-07-13 DIAGNOSIS — Z6841 Body Mass Index (BMI) 40.0 and over, adult: Secondary | ICD-10-CM

## 2018-07-13 HISTORY — PX: POSTERIOR FUSION LUMBAR SPINE: SUR632

## 2018-07-13 LAB — GLUCOSE, CAPILLARY
Glucose-Capillary: 132 mg/dL — ABNORMAL HIGH (ref 70–99)
Glucose-Capillary: 152 mg/dL — ABNORMAL HIGH (ref 70–99)
Glucose-Capillary: 159 mg/dL — ABNORMAL HIGH (ref 70–99)

## 2018-07-13 SURGERY — POSTERIOR LUMBAR FUSION 2 LEVEL
Anesthesia: General

## 2018-07-13 MED ORDER — DOCUSATE SODIUM 100 MG PO CAPS
100.0000 mg | ORAL_CAPSULE | Freq: Two times a day (BID) | ORAL | Status: DC
  Administered 2018-07-13 – 2018-07-17 (×8): 100 mg via ORAL
  Filled 2018-07-13 (×8): qty 1

## 2018-07-13 MED ORDER — SUCCINYLCHOLINE CHLORIDE 200 MG/10ML IV SOSY
PREFILLED_SYRINGE | INTRAVENOUS | Status: AC
  Filled 2018-07-13: qty 10

## 2018-07-13 MED ORDER — MEPERIDINE HCL 50 MG/ML IJ SOLN: 6.2500 mg | INTRAMUSCULAR | Status: DC | PRN

## 2018-07-13 MED ORDER — CEFAZOLIN SODIUM 1 G IJ SOLR
INTRAMUSCULAR | Status: AC
  Filled 2018-07-13: qty 20

## 2018-07-13 MED ORDER — MIDAZOLAM HCL 2 MG/2ML IJ SOLN
INTRAMUSCULAR | Status: AC
  Filled 2018-07-13: qty 2

## 2018-07-13 MED ORDER — GABAPENTIN 300 MG PO CAPS
300.0000 mg | ORAL_CAPSULE | Freq: Once | ORAL | Status: AC
  Administered 2018-07-13: 300 mg via ORAL
  Filled 2018-07-13 (×2): qty 1

## 2018-07-13 MED ORDER — SODIUM CHLORIDE 0.9 % IV SOLN
INTRAVENOUS | Status: DC
  Administered 2018-07-13 – 2018-07-14 (×2): via INTRAVENOUS

## 2018-07-13 MED ORDER — LIDOCAINE 2% (20 MG/ML) 5 ML SYRINGE
INTRAMUSCULAR | Status: AC
  Filled 2018-07-13: qty 5

## 2018-07-13 MED ORDER — HYDROMORPHONE HCL 1 MG/ML IJ SOLN
INTRAMUSCULAR | Status: AC
  Filled 2018-07-13: qty 1

## 2018-07-13 MED ORDER — HYDROMORPHONE HCL 1 MG/ML IJ SOLN
INTRAMUSCULAR | Status: DC | PRN
  Administered 2018-07-13: 1 mg via INTRAVENOUS

## 2018-07-13 MED ORDER — BUPIVACAINE HCL 0.5 % IJ SOLN
INTRAMUSCULAR | Status: DC | PRN
  Administered 2018-07-13: 20 mL

## 2018-07-13 MED ORDER — OXYCODONE HCL 5 MG PO TABS
10.0000 mg | ORAL_TABLET | ORAL | Status: DC | PRN
  Administered 2018-07-14 – 2018-07-17 (×6): 10 mg via ORAL
  Filled 2018-07-13 (×6): qty 2

## 2018-07-13 MED ORDER — GLYCOPYRROLATE PF 0.2 MG/ML IJ SOSY
PREFILLED_SYRINGE | INTRAMUSCULAR | Status: AC
  Filled 2018-07-13: qty 1

## 2018-07-13 MED ORDER — OXYCODONE HCL 5 MG PO TABS
ORAL_TABLET | ORAL | Status: AC
  Filled 2018-07-13: qty 1

## 2018-07-13 MED ORDER — SUGAMMADEX SODIUM 200 MG/2ML IV SOLN
INTRAVENOUS | Status: DC | PRN
  Administered 2018-07-13: 300 mg via INTRAVENOUS

## 2018-07-13 MED ORDER — METHOCARBAMOL 1000 MG/10ML IJ SOLN
500.0000 mg | Freq: Four times a day (QID) | INTRAVENOUS | Status: DC | PRN
  Filled 2018-07-13: qty 5

## 2018-07-13 MED ORDER — METHADONE HCL 10 MG PO TABS
10.0000 mg | ORAL_TABLET | Freq: Three times a day (TID) | ORAL | Status: DC
  Administered 2018-07-13 – 2018-07-17 (×11): 10 mg via ORAL
  Filled 2018-07-13 (×11): qty 1

## 2018-07-13 MED ORDER — HEMOSTATIC AGENTS (NO CHARGE) OPTIME
TOPICAL | Status: DC | PRN
  Administered 2018-07-13: 1 via TOPICAL

## 2018-07-13 MED ORDER — PREGABALIN 50 MG PO CAPS
75.0000 mg | ORAL_CAPSULE | Freq: Two times a day (BID) | ORAL | Status: DC
  Administered 2018-07-13 – 2018-07-16 (×2): 75 mg via ORAL
  Filled 2018-07-13 (×7): qty 1

## 2018-07-13 MED ORDER — THROMBIN (RECOMBINANT) 20000 UNITS EX SOLR
CUTANEOUS | Status: AC
  Filled 2018-07-13: qty 20000

## 2018-07-13 MED ORDER — ROCURONIUM BROMIDE 50 MG/5ML IV SOSY
PREFILLED_SYRINGE | INTRAVENOUS | Status: AC
  Filled 2018-07-13: qty 15

## 2018-07-13 MED ORDER — DEXAMETHASONE SODIUM PHOSPHATE 4 MG/ML IJ SOLN
INTRAMUSCULAR | Status: DC | PRN
  Administered 2018-07-13: 10 mg via INTRAVENOUS

## 2018-07-13 MED ORDER — GLYCOPYRROLATE PF 0.2 MG/ML IJ SOSY
PREFILLED_SYRINGE | INTRAMUSCULAR | Status: DC | PRN
  Administered 2018-07-13: .1 mg via INTRAVENOUS

## 2018-07-13 MED ORDER — ROCURONIUM BROMIDE 50 MG/5ML IV SOSY
PREFILLED_SYRINGE | INTRAVENOUS | Status: AC
  Filled 2018-07-13: qty 5

## 2018-07-13 MED ORDER — LACTATED RINGERS IV SOLN
INTRAVENOUS | Status: DC | PRN
  Administered 2018-07-13: 07:00:00 via INTRAVENOUS

## 2018-07-13 MED ORDER — CEFAZOLIN SODIUM-DEXTROSE 2-4 GM/100ML-% IV SOLN
2.0000 g | INTRAVENOUS | Status: AC
  Administered 2018-07-13 (×2): 2 g via INTRAVENOUS

## 2018-07-13 MED ORDER — SODIUM CHLORIDE 0.9 % IV SOLN: 250.0000 mL | INTRAVENOUS | Status: DC

## 2018-07-13 MED ORDER — PHENYLEPHRINE 40 MCG/ML (10ML) SYRINGE FOR IV PUSH (FOR BLOOD PRESSURE SUPPORT)
PREFILLED_SYRINGE | INTRAVENOUS | Status: DC | PRN
  Administered 2018-07-13 (×5): 80 ug via INTRAVENOUS

## 2018-07-13 MED ORDER — LISINOPRIL 20 MG PO TABS
20.0000 mg | ORAL_TABLET | Freq: Once | ORAL | Status: AC
  Administered 2018-07-13: 20 mg via ORAL
  Filled 2018-07-13: qty 1

## 2018-07-13 MED ORDER — DEXAMETHASONE SODIUM PHOSPHATE 10 MG/ML IJ SOLN
INTRAMUSCULAR | Status: AC
  Filled 2018-07-13: qty 1

## 2018-07-13 MED ORDER — HYDROMORPHONE HCL 1 MG/ML IJ SOLN
0.2500 mg | INTRAMUSCULAR | Status: DC | PRN
  Administered 2018-07-13 (×3): 0.5 mg via INTRAVENOUS

## 2018-07-13 MED ORDER — SODIUM CHLORIDE 0.9% FLUSH: 3.0000 mL | INTRAVENOUS | Status: DC | PRN

## 2018-07-13 MED ORDER — POTASSIUM CHLORIDE CRYS ER 20 MEQ PO TBCR
20.0000 meq | EXTENDED_RELEASE_TABLET | Freq: Every day | ORAL | Status: DC
  Administered 2018-07-13 – 2018-07-14 (×2): 20 meq via ORAL
  Filled 2018-07-13 (×2): qty 1

## 2018-07-13 MED ORDER — KETAMINE HCL 50 MG/5ML IJ SOSY
PREFILLED_SYRINGE | INTRAMUSCULAR | Status: AC
  Filled 2018-07-13: qty 10

## 2018-07-13 MED ORDER — LIDOCAINE 2% (20 MG/ML) 5 ML SYRINGE
INTRAMUSCULAR | Status: DC | PRN
  Administered 2018-07-13: 100 mg via INTRAVENOUS

## 2018-07-13 MED ORDER — PHENYLEPHRINE 40 MCG/ML (10ML) SYRINGE FOR IV PUSH (FOR BLOOD PRESSURE SUPPORT)
PREFILLED_SYRINGE | INTRAVENOUS | Status: AC
  Filled 2018-07-13: qty 10

## 2018-07-13 MED ORDER — ONDANSETRON HCL 4 MG/2ML IJ SOLN
4.0000 mg | Freq: Once | INTRAMUSCULAR | Status: AC | PRN
  Administered 2018-07-13: 4 mg via INTRAVENOUS

## 2018-07-13 MED ORDER — CEFAZOLIN SODIUM-DEXTROSE 2-4 GM/100ML-% IV SOLN
2.0000 g | Freq: Three times a day (TID) | INTRAVENOUS | Status: AC
  Administered 2018-07-13 – 2018-07-14 (×2): 2 g via INTRAVENOUS
  Filled 2018-07-13: qty 100

## 2018-07-13 MED ORDER — POLYETHYLENE GLYCOL 3350 17 G PO PACK: 17.0000 g | PACK | Freq: Every day | ORAL | Status: DC | PRN

## 2018-07-13 MED ORDER — AMLODIPINE BESYLATE 10 MG PO TABS
10.0000 mg | ORAL_TABLET | Freq: Every day | ORAL | Status: DC
  Administered 2018-07-13 – 2018-07-17 (×5): 10 mg via ORAL
  Filled 2018-07-13 (×5): qty 1

## 2018-07-13 MED ORDER — SODIUM CHLORIDE 0.9% FLUSH
3.0000 mL | Freq: Two times a day (BID) | INTRAVENOUS | Status: DC
  Administered 2018-07-13 – 2018-07-17 (×8): 3 mL via INTRAVENOUS

## 2018-07-13 MED ORDER — HYDROMORPHONE HCL 1 MG/ML IJ SOLN
INTRAMUSCULAR | Status: AC
  Filled 2018-07-13: qty 0.5

## 2018-07-13 MED ORDER — FLEET ENEMA 7-19 GM/118ML RE ENEM: 1.0000 | ENEMA | Freq: Once | RECTAL | Status: DC | PRN

## 2018-07-13 MED ORDER — MENTHOL 3 MG MT LOZG: 1.0000 | LOZENGE | OROMUCOSAL | Status: DC | PRN

## 2018-07-13 MED ORDER — METOPROLOL TARTRATE 50 MG PO TABS
50.0000 mg | ORAL_TABLET | Freq: Two times a day (BID) | ORAL | Status: DC
  Administered 2018-07-13 – 2018-07-17 (×8): 50 mg via ORAL
  Filled 2018-07-13 (×8): qty 1

## 2018-07-13 MED ORDER — METHOCARBAMOL 500 MG PO TABS
ORAL_TABLET | ORAL | Status: AC
  Filled 2018-07-13: qty 1

## 2018-07-13 MED ORDER — PROPOFOL 10 MG/ML IV BOLUS
INTRAVENOUS | Status: AC
  Filled 2018-07-13: qty 20

## 2018-07-13 MED ORDER — HYDROCODONE-ACETAMINOPHEN 7.5-325 MG PO TABS: 1.0000 | ORAL_TABLET | Freq: Once | ORAL | Status: DC | PRN

## 2018-07-13 MED ORDER — FENTANYL CITRATE (PF) 250 MCG/5ML IJ SOLN
INTRAMUSCULAR | Status: AC
  Filled 2018-07-13: qty 5

## 2018-07-13 MED ORDER — ACETAMINOPHEN 325 MG PO TABS
650.0000 mg | ORAL_TABLET | ORAL | Status: DC | PRN
  Administered 2018-07-13 – 2018-07-16 (×3): 650 mg via ORAL
  Filled 2018-07-13 (×3): qty 2

## 2018-07-13 MED ORDER — KETAMINE HCL 50 MG/ML IJ SOLN
INTRAMUSCULAR | Status: DC | PRN
  Administered 2018-07-13: 50 mg via INTRAMUSCULAR
  Administered 2018-07-13: 30 mg via INTRAMUSCULAR

## 2018-07-13 MED ORDER — ACETAMINOPHEN 500 MG PO TABS
1000.0000 mg | ORAL_TABLET | Freq: Once | ORAL | Status: AC
  Administered 2018-07-13: 1000 mg via ORAL
  Filled 2018-07-13: qty 2

## 2018-07-13 MED ORDER — ONDANSETRON HCL 4 MG/2ML IJ SOLN
INTRAMUSCULAR | Status: DC | PRN
  Administered 2018-07-13: 4 mg via INTRAVENOUS

## 2018-07-13 MED ORDER — PROPOFOL 10 MG/ML IV BOLUS
INTRAVENOUS | Status: DC | PRN
  Administered 2018-07-13: 180 mg via INTRAVENOUS

## 2018-07-13 MED ORDER — EPHEDRINE 5 MG/ML INJ
INTRAVENOUS | Status: AC
  Filled 2018-07-13: qty 10

## 2018-07-13 MED ORDER — INSULIN ASPART 100 UNIT/ML ~~LOC~~ SOLN
0.0000 [IU] | Freq: Three times a day (TID) | SUBCUTANEOUS | Status: DC
  Administered 2018-07-14 – 2018-07-16 (×4): 3 [IU] via SUBCUTANEOUS
  Administered 2018-07-16: 2 [IU] via SUBCUTANEOUS

## 2018-07-13 MED ORDER — ANASTROZOLE 1 MG PO TABS
1.0000 mg | ORAL_TABLET | Freq: Every day | ORAL | Status: DC
  Administered 2018-07-13 – 2018-07-16 (×4): 1 mg via ORAL
  Filled 2018-07-13 (×4): qty 1

## 2018-07-13 MED ORDER — ZOLPIDEM TARTRATE 5 MG PO TABS
5.0000 mg | ORAL_TABLET | Freq: Every evening | ORAL | Status: DC | PRN
  Administered 2018-07-16: 5 mg via ORAL
  Filled 2018-07-13: qty 1

## 2018-07-13 MED ORDER — BUPIVACAINE HCL (PF) 0.5 % IJ SOLN
INTRAMUSCULAR | Status: AC
  Filled 2018-07-13: qty 30

## 2018-07-13 MED ORDER — ONDANSETRON HCL 4 MG/2ML IJ SOLN
INTRAMUSCULAR | Status: AC
  Filled 2018-07-13: qty 2

## 2018-07-13 MED ORDER — THROMBIN 20000 UNITS EX SOLR
CUTANEOUS | Status: DC | PRN
  Administered 2018-07-13: 20 mL via TOPICAL

## 2018-07-13 MED ORDER — AMITRIPTYLINE HCL 25 MG PO TABS
50.0000 mg | ORAL_TABLET | Freq: Every day | ORAL | Status: DC
  Administered 2018-07-13 – 2018-07-16 (×4): 50 mg via ORAL
  Filled 2018-07-13 (×4): qty 2

## 2018-07-13 MED ORDER — CEFAZOLIN SODIUM-DEXTROSE 2-4 GM/100ML-% IV SOLN
INTRAVENOUS | Status: AC
  Filled 2018-07-13: qty 100

## 2018-07-13 MED ORDER — METHOCARBAMOL 500 MG PO TABS
500.0000 mg | ORAL_TABLET | Freq: Four times a day (QID) | ORAL | Status: DC | PRN
  Administered 2018-07-13 – 2018-07-17 (×5): 500 mg via ORAL
  Filled 2018-07-13 (×4): qty 1

## 2018-07-13 MED ORDER — SODIUM CHLORIDE 0.9 % IV SOLN
INTRAVENOUS | Status: DC | PRN
  Administered 2018-07-13: 14:00:00 via INTRAVENOUS

## 2018-07-13 MED ORDER — ROCURONIUM BROMIDE 50 MG/5ML IV SOSY
PREFILLED_SYRINGE | INTRAVENOUS | Status: DC | PRN
  Administered 2018-07-13: 50 mg via INTRAVENOUS
  Administered 2018-07-13: 30 mg via INTRAVENOUS
  Administered 2018-07-13 (×2): 50 mg via INTRAVENOUS

## 2018-07-13 MED ORDER — CHLORHEXIDINE GLUCONATE 4 % EX LIQD: 60.0000 mL | Freq: Once | CUTANEOUS | Status: DC

## 2018-07-13 MED ORDER — OXYCODONE HCL 5 MG PO TABS
5.0000 mg | ORAL_TABLET | ORAL | Status: DC | PRN
  Administered 2018-07-13 – 2018-07-15 (×2): 5 mg via ORAL
  Filled 2018-07-13: qty 1

## 2018-07-13 MED ORDER — ONDANSETRON HCL 4 MG/2ML IJ SOLN
4.0000 mg | Freq: Four times a day (QID) | INTRAMUSCULAR | Status: DC | PRN
  Administered 2018-07-13: 4 mg via INTRAVENOUS
  Filled 2018-07-13: qty 2

## 2018-07-13 MED ORDER — PHENOL 1.4 % MT LIQD: 1.0000 | OROMUCOSAL | Status: DC | PRN

## 2018-07-13 MED ORDER — SODIUM CHLORIDE 0.9 % IV SOLN
INTRAVENOUS | Status: DC | PRN
  Administered 2018-07-13 (×2): via INTRAVENOUS
  Administered 2018-07-13: 40 ug/min via INTRAVENOUS

## 2018-07-13 MED ORDER — PANTOPRAZOLE SODIUM 40 MG PO TBEC
40.0000 mg | DELAYED_RELEASE_TABLET | Freq: Two times a day (BID) | ORAL | Status: DC
  Administered 2018-07-13 – 2018-07-17 (×8): 40 mg via ORAL
  Filled 2018-07-13 (×8): qty 1

## 2018-07-13 MED ORDER — OXYCODONE HCL ER 20 MG PO T12A
20.0000 mg | EXTENDED_RELEASE_TABLET | Freq: Two times a day (BID) | ORAL | Status: DC
  Administered 2018-07-13 – 2018-07-17 (×7): 20 mg via ORAL
  Filled 2018-07-13 (×7): qty 1

## 2018-07-13 MED ORDER — ACETAMINOPHEN 650 MG RE SUPP: 650.0000 mg | RECTAL | Status: DC | PRN

## 2018-07-13 MED ORDER — BUPIVACAINE LIPOSOME 1.3 % IJ SUSP
20.0000 mL | Freq: Once | INTRAMUSCULAR | Status: AC
  Administered 2018-07-13: 20 mL
  Filled 2018-07-13: qty 20

## 2018-07-13 MED ORDER — FENTANYL CITRATE (PF) 100 MCG/2ML IJ SOLN
INTRAMUSCULAR | Status: DC | PRN
  Administered 2018-07-13: 100 ug via INTRAVENOUS
  Administered 2018-07-13: 50 ug via INTRAVENOUS
  Administered 2018-07-13 (×2): 100 ug via INTRAVENOUS

## 2018-07-13 MED ORDER — EPHEDRINE SULFATE-NACL 50-0.9 MG/10ML-% IV SOSY
PREFILLED_SYRINGE | INTRAVENOUS | Status: DC | PRN
  Administered 2018-07-13 (×3): 10 mg via INTRAVENOUS
  Administered 2018-07-13: 5 mg via INTRAVENOUS

## 2018-07-13 MED ORDER — 0.9 % SODIUM CHLORIDE (POUR BTL) OPTIME
TOPICAL | Status: DC | PRN
  Administered 2018-07-13: 1000 mL

## 2018-07-13 MED ORDER — ACETAMINOPHEN 10 MG/ML IV SOLN: 1000.0000 mg | Freq: Once | INTRAVENOUS | Status: DC | PRN

## 2018-07-13 MED ORDER — ALUM & MAG HYDROXIDE-SIMETH 200-200-20 MG/5ML PO SUSP: 30.0000 mL | Freq: Four times a day (QID) | ORAL | Status: DC | PRN

## 2018-07-13 MED ORDER — HYDROMORPHONE HCL 1 MG/ML IJ SOLN: 1.0000 mg | INTRAMUSCULAR | Status: DC | PRN

## 2018-07-13 MED ORDER — BISACODYL 5 MG PO TBEC
5.0000 mg | DELAYED_RELEASE_TABLET | Freq: Every day | ORAL | Status: DC | PRN
  Administered 2018-07-16: 5 mg via ORAL
  Filled 2018-07-13: qty 1

## 2018-07-13 MED ORDER — MIDAZOLAM HCL 5 MG/5ML IJ SOLN
INTRAMUSCULAR | Status: DC | PRN
  Administered 2018-07-13: 2 mg via INTRAVENOUS

## 2018-07-13 MED ORDER — ONDANSETRON HCL 4 MG PO TABS: 4.0000 mg | ORAL_TABLET | Freq: Four times a day (QID) | ORAL | Status: DC | PRN

## 2018-07-13 MED ORDER — ALBUMIN HUMAN 5 % IV SOLN
INTRAVENOUS | Status: DC | PRN
  Administered 2018-07-13 (×3): via INTRAVENOUS

## 2018-07-13 SURGICAL SUPPLY — 77 items
ADH SKN CLS APL DERMABOND .7 (GAUZE/BANDAGES/DRESSINGS) ×1
AGENT HMST KT MTR STRL THRMB (HEMOSTASIS) ×1
BLADE CLIPPER SURG (BLADE) IMPLANT
BONE CANC CHIPS 20CC PCAN1/4 (Bone Implant) ×6 IMPLANT
BONE VIVIGEN FORMABLE 5.4CC (Bone Implant) ×3 IMPLANT
BUR MATCHSTICK NEURO 3.0 LAGG (BURR) ×3 IMPLANT
BUR RND FLUTED 2.5 (BURR) IMPLANT
BUR SABER RD CUTTING 3.0 (BURR) IMPLANT
BUR SABER RD CUTTING 3.0MM (BURR)
CHIPS CANC BONE 20CC PCAN1/4 (Bone Implant) ×2 IMPLANT
COVER BACK TABLE 80X110 HD (DRAPES) ×3 IMPLANT
COVER MAYO STAND STRL (DRAPES) ×3 IMPLANT
COVER SURGICAL LIGHT HANDLE (MISCELLANEOUS) ×3 IMPLANT
COVER WAND RF STERILE (DRAPES) ×3 IMPLANT
DERMABOND ADVANCED (GAUZE/BANDAGES/DRESSINGS) ×2
DERMABOND ADVANCED .7 DNX12 (GAUZE/BANDAGES/DRESSINGS) ×1 IMPLANT
DRAPE C-ARM 42X72 X-RAY (DRAPES) ×6 IMPLANT
DRAPE C-ARMOR (DRAPES) ×3 IMPLANT
DRAPE MICROSCOPE LEICA (MISCELLANEOUS) ×3 IMPLANT
DRAPE SURG 17X23 STRL (DRAPES) ×12 IMPLANT
DRSG MEPILEX BORDER 4X4 (GAUZE/BANDAGES/DRESSINGS) ×2 IMPLANT
DRSG MEPILEX BORDER 4X8 (GAUZE/BANDAGES/DRESSINGS) ×2 IMPLANT
DURAPREP 26ML APPLICATOR (WOUND CARE) ×3 IMPLANT
ELECT BLADE 6.5 EXT (BLADE) ×2 IMPLANT
ELECT CAUTERY BLADE 6.4 (BLADE) ×3 IMPLANT
ELECT REM PT RETURN 9FT ADLT (ELECTROSURGICAL) ×3
ELECTRODE REM PT RTRN 9FT ADLT (ELECTROSURGICAL) ×1 IMPLANT
EVACUATOR 1/8 PVC DRAIN (DRAIN) IMPLANT
GLOVE BIOGEL PI IND STRL 8 (GLOVE) ×1 IMPLANT
GLOVE BIOGEL PI INDICATOR 8 (GLOVE) ×8
GLOVE ECLIPSE 9.0 STRL (GLOVE) ×9 IMPLANT
GLOVE ORTHO TXT STRL SZ7.5 (GLOVE) ×9 IMPLANT
GLOVE SURG 8.5 LATEX PF (GLOVE) ×9 IMPLANT
GOWN STRL REUS W/ TWL LRG LVL3 (GOWN DISPOSABLE) ×1 IMPLANT
GOWN STRL REUS W/TWL 2XL LVL3 (GOWN DISPOSABLE) ×16 IMPLANT
GOWN STRL REUS W/TWL LRG LVL3 (GOWN DISPOSABLE) ×3
GRAFT BNE CANC CHIPS 1-8 20CC (Bone Implant) IMPLANT
GRAFT BNE MATRIX VG FRMBL MD 5 (Bone Implant) IMPLANT
KIT BASIN OR (CUSTOM PROCEDURE TRAY) ×3 IMPLANT
KIT POSITION SURG JACKSON T1 (MISCELLANEOUS) ×3 IMPLANT
KIT TURNOVER KIT B (KITS) ×3 IMPLANT
MANIFOLD NEPTUNE II (INSTRUMENTS) ×3 IMPLANT
NDL SPNL 18GX3.5 QUINCKE PK (NEEDLE) ×1 IMPLANT
NEEDLE 22X1 1/2 (OR ONLY) (NEEDLE) ×3 IMPLANT
NEEDLE SPNL 18GX3.5 QUINCKE PK (NEEDLE) ×3 IMPLANT
NS IRRIG 1000ML POUR BTL (IV SOLUTION) ×5 IMPLANT
PACK LAMINECTOMY ORTHO (CUSTOM PROCEDURE TRAY) ×3 IMPLANT
PAD ARMBOARD 7.5X6 YLW CONV (MISCELLANEOUS) ×6 IMPLANT
PATTIES SURGICAL .75X.75 (GAUZE/BANDAGES/DRESSINGS) IMPLANT
PATTIES SURGICAL 1X1 (DISPOSABLE) ×3 IMPLANT
ROD EXPEDIUM PER BENT 65MM (Rod) ×4 IMPLANT
SCREW SET SINGLE INNER (Screw) ×12 IMPLANT
SCREW VIPER 7X45MM (Screw) ×2 IMPLANT
SCREW VIPER 7X50MM (Screw) ×10 IMPLANT
SPACER CONCORDE PRO 9X11X27 (Spacer) ×2 IMPLANT
SPACER CONCORDE PRO 9X13X27MM (Spacer) ×2 IMPLANT
SPONGE LAP 4X18 RFD (DISPOSABLE) ×2 IMPLANT
SPONGE SURGIFOAM ABS GEL 100 (HEMOSTASIS) ×3 IMPLANT
SURGIFLO W/THROMBIN 8M KIT (HEMOSTASIS) ×4 IMPLANT
SUT VIC AB 0 CT1 27 (SUTURE) ×3
SUT VIC AB 0 CT1 27XBRD ANBCTR (SUTURE) ×1 IMPLANT
SUT VIC AB 1 CTX 36 (SUTURE) ×6
SUT VIC AB 1 CTX36XBRD ANBCTR (SUTURE) ×2 IMPLANT
SUT VIC AB 2-0 CT1 27 (SUTURE) ×3
SUT VIC AB 2-0 CT1 TAPERPNT 27 (SUTURE) ×1 IMPLANT
SUT VIC AB 3-0 X1 27 (SUTURE) ×3 IMPLANT
SYR 20CC LL (SYRINGE) ×3 IMPLANT
SYR CONTROL 10ML LL (SYRINGE) ×6 IMPLANT
TAP CANN VIPER2 DL 5.0 (TAP) ×2 IMPLANT
TAP CANN VIPER2 DL 6.0 (TAP) ×2 IMPLANT
TAP CANN VIPER2 DL 7.0 (TAP) ×2 IMPLANT
TAP VIPER MIS 4.35MM (TAP) ×2 IMPLANT
TOWEL GREEN STERILE (TOWEL DISPOSABLE) ×3 IMPLANT
TOWEL GREEN STERILE FF (TOWEL DISPOSABLE) ×3 IMPLANT
TRAY FOLEY MTR SLVR 16FR STAT (SET/KITS/TRAYS/PACK) ×3 IMPLANT
WATER STERILE IRR 1000ML POUR (IV SOLUTION) ×3 IMPLANT
YANKAUER SUCT BULB TIP NO VENT (SUCTIONS) ×3 IMPLANT

## 2018-07-13 NOTE — Brief Op Note (Signed)
07/13/2018  3:05 PM  PATIENT:  [name]  57 y.o. female  PRE-OPERATIVE DIAGNOSIS:  lumbar degenerative disc disease and lumbar spinal stenosis L4-5 and L5-S1  POST-OPERATIVE DIAGNOSIS:  lumbar degenerative disc disease and lumbar spinal stenosis L4-5 and L5-S1  PROCEDURE:  Procedure(s): TRANSFORAMINAL LUMBAR INTERBODY FUSION RIGHT L4-5 AND RIGHT L5-S1 WITH SCREWS, RODS AND CAGES, LOCAL AND ALLOGRAFT BONE GRAFT AND VIVIGEN, BILATERAL DECOMPRESSIVE LAMINECTOMIES L4-5 AND L5-S1 (N/A)  SURGEON:  Surgeon(s) and Role:    [name], MD - Primary  PHYSICIAN ASSISTANT: Esaw Grandchild  ANESTHESIA:   local and general, Dr. Valma Cava  EBL:  500 mL   BLOOD ADMINISTERED:250 CC CELLSAVER  DRAINS: Urinary Catheter (Foley)   LOCAL MEDICATIONS USED:  MARCAINE 0.5% 1:1 EXPAREL 1.3%  Amount: 30 ml  SPECIMEN:  No Specimen  DISPOSITION OF SPECIMEN:  N/A  COUNTS:  YES  TOURNIQUET:  * No tourniquets in log *  DICTATION: .Dragon Dictation  PLAN OF CARE: Admit to inpatient   PATIENT DISPOSITION:  PACU - hemodynamically stable.   Delay start of Pharmacological VTE agent (>24hrs) due to surgical blood loss or risk of bleeding: yes

## 2018-07-13 NOTE — Anesthesia Postprocedure Evaluation (Signed)
Anesthesia Post Note  Patient: [name]  Procedure(s) Performed: TRANSFORAMINAL LUMBAR INTERBODY FUSION RIGHT L4-5 AND RIGHT L5-S1 WITH SCREWS, RODS AND CAGES, LOCAL AND ALLOGRAFT BONE GRAFT AND VIVIGEN, BILATERAL DECOMPRESSIVE LAMINECTOMIES L4-5 AND L5-S1 (N/A )     Patient location during evaluation: PACU Anesthesia Type: General Level of consciousness: awake and alert Pain management: pain level controlled Vital Signs Assessment: post-procedure vital signs reviewed and stable Respiratory status: spontaneous breathing, nonlabored ventilation, respiratory function stable and patient connected to nasal cannula oxygen Cardiovascular status: blood pressure returned to baseline and stable Postop Assessment: no apparent nausea or vomiting Anesthetic complications: no    Last Vitals:  Vitals:   07/13/18 1559 07/13/18 1600  BP:  119/79  Pulse: 95   Resp: 17   Temp:    SpO2: 92%     Last Pain:  Vitals:   07/13/18 1559  TempSrc:   PainSc: 9                  Barnet Glasgow

## 2018-07-13 NOTE — Progress Notes (Signed)
Pt. States gabapentin has caused bad headaches in the past. Notified Dr. Valma Cava of pt.'s intolerance. Stated it was okay to give to pt.

## 2018-07-13 NOTE — Op Note (Addendum)
07/13/2018  3:33 PM  PATIENT:  [name]  57 y.o. female  MRN: [id number]  OPERATIVE REPORT  PRE-OPERATIVE DIAGNOSIS:  lumbar degenerative disc disease and lumbar spinal stenosis L4-5 and L5-S1  POST-OPERATIVE DIAGNOSIS:  lumbar degenerative disc disease and lumbar spinal stenosis L4-5 and L5-S1  PROCEDURE:  Procedure(s): TRANSFORAMINAL LUMBAR INTERBODY FUSION RIGHT L4-5 AND RIGHT L5-S1 WITH SCREWS, RODS AND CAGES, LOCAL AND ALLOGRAFT BONE GRAFT AND VIVIGEN, BILATERAL DECOMPRESSIVE LAMINECTOMIES L4-5 AND L5-S1    SURGEON:  [name], MD     ASSISTANT:  Benjiman Core, PA-C  (Present throughout the entire procedure and necessary for completion of procedure in a timely manner)     ANESTHESIA:  General,supplemented with local marcaine 0.5% 1:1 exparel 1.3% total 30 cc. Dr. Valma Cava    COMPLICATIONS:  None.   EBL:780CC  CELL SAVER BLOOD RETURNED: 250CC    COMPONENTS  Implant Name Type Inv. Item Serial No. Manufacturer Lot No. LRB No. Used  BONE CANC CHIPS 20CC - [id number] Bone Implant BONE [facility] CHIPS 20CC [id number] [employer] [facility]  N/A 1  BONE VIVIGEN FORMABLE 5.4CC - [id number] Bone Implant BONE VIVIGEN FORMABLE 5.4CC [id number] [employer]  N/A 1  SPACER CONCORDE PRO [id number] - [id number] Spacer SPACER CONCORDE PRO [id number]  [employer] [id number] N/A 1  BONE CANC CHIPS 20CC - [id number] Bone Implant BONE CANC CHIPS 20CC [id number] [employer] [place] [facility] [facility]  N/A 1  SCREW VIPER 7X50MM - [id number] Screw SCREW VIPER 7X50MM  [employer]  N/A 5  SCREW SET SINGLE INNER - [id number] Screw SCREW SET SINGLE INNER  [employer]  N/A 6  SPACER CONCORDE PRO [id number] - [id number] Spacer SPACER CONCORDE PRO [id number]  [employer] [id number] N/A 1  ROD EXPEDIUM PER BENT 65MM - [id number] Rod ROD EXPEDIUM PER BENT 65MM  [employer]  N/A 2  SCREW VIPER 7X45MM - [id number] Screw SCREW  VIPER 7X45MM  [employer]  N/A 1  :   PROCEDURE: The patient was met in the holding area, and the appropriate lumbar levels right L4-5 and L5-S1 identified and marked with an "X" and my initials. I had discussion with the patient in the preop holding area regarding a change of consent form.The fusion levels are reidentified as L4-5 and L5-S1. Patient understands the rationale to perform TLIFs at two levels to decompress the left L4-5 and L5-S1 lateral recess and foramenal stenosis and to allow for maintaining her normal lordosis. The patient was then transported to OR and was placed under general anestheticwithout difficulty. The patient received appropriate preoperative antibiotic prophylaxis 2 gm Ancef.  Nursing staff inserted a Foley catheter under sterile conditions. The patient was then turned to a prone position using the [place] spine frame. PAS. all pressure points well padded the arms at the side to 90 90. Standard prep with DuraPrep solution draped in the usual manner from the lower dorsal spine the mid sacral segment. Iodine Vi-Drape was used and the old incision scar was marked. Time-out procedure was called and correct. Skin in the midline between L3and S2 was then infiltrated with local anesthesia, marcaine 1/2% 1:1 exparel 1.3% total 20 cc used. Incision was then made  extending from L3-S2  through the skin and subcutaneous layers down to the patient's lumbodorsal fascia and spinous processes. The incision then carried sharply excising the supraspinous ligament and then continuing the lateral aspect of the spinous processes of L3,  L4, L5 and S1. Cobb elevator used to carefully elevate the paralumbar muscles off of the posterior elements using electrocautery carefully drilled bleeding and perform dissection of the muscle tissues of the preserving the facet capsule at the L3-4. Continuing the exposure out laterally to expose the lateral margin of the facet joint line at L3-4, L4-5  and L5-S1. Incision was carried in the midline down to the S1 level area bleeders controlled using electrocautery monopolar electrocautery.   C-arm fluoroscopy was then brought into the field and using C-arm fluoroscopy then a hole made into the medial aspect of the pedicle of left L4 observed in the pedicle using C arm at the 5 oclock position on the left L4 pedicle nerve probe initial entry was determined on fluoroscopy to be good position alignment so that a 4.28m tap was passed to 48 mm within the left L4 pedicle to a depth of nearly 48 mm observed on C-arm fluoroscopy to be beyond the midpoint of the lumbar vertebra and then position alignment within the left L4 pedicle this was then removed and the pedicle channel probed demonstrating patency no sign of rupture the cortex of the pedicle. Tapping with a 4 mm screw tap, 5 mm tap, 679mtap and then 7 mm tap. Then 7.0 mm x 50 mm screw was chosen to be placed later following TLIF portion of this procedure. C-arm fluoroscopy was then brought into the field and using C-arm fluoroscopy then a hole made into the posterior medial aspect of the pedicle of right L4 observed in the pedicle using ball tipped nerve hook and hockey stick nerve probe initial entry was determined on fluoroscopy to be good position alignment so that 4.61m12map was then used to tap the right L4 pedicle to a depth of nearly 48 mm observed on C-arm fluoroscopy to be beyond the midpoint of the lumbar vertebra and then position alignment within the right L4 pedicle this was then removed and the pedicle channel probed demonstrating patency no sign of rupture the cortex of the pedicle. Tapping with a 4.43m76mp, 5 mm tap, 6mm 19m and then 7 mm tap.  Then 7.0 mm x 50 mm screw was then placed on the right side at L4. C-arm fluoroscopy was then brought into the field and using C-arm fluoroscopy then a hole made into the posterior and medial aspect of the left pedicle of L5 observed in the pedicle using  ball tipped nerve hook and hockey stick nerve probe initial entry was determined on fluoroscopy to be good position alignment so that a 4.61mm t41mwas then used to tap the left L5 pedicle to a depth of nearly 48 mm observed on C-arm fluoroscopy to be beyond the posterior one third of the lumbar vertebra and good position alignment within the left L5 pedicle this was then removed and the pedicle channel probed demonstrating patency no sign of rupture the cortex of the pedicle. Tapping with a 4.35 mm screw tap, 5 mm tap, 6mm ta62mnd then 7 mm tap. then 7.61mm x 552mm screw was chosen to be placed in the left L5 pedicle later following TLIF portion of this procedure. The pedicle channel of L5 on the left probed demonstrating patency no sign of rupture the cortex of the pedicle. Viper screw for fixation of this level was measured as 7.0 mm x 50 mm screw so  was placed on the left side at the L5 level. C-arm fluoroscopy was then brought into the field and using C-arm  fluoroscopy then a hole made into the chosen to be placed later following TLIF portion of this procedureposterior and medial aspect of the right pedicle of L5 observed in the pedicle using ball tipped nerve hook and hockey stick nerve probe initial entry was determined on fluoroscopy to be good position alignment so that a 4.53m tap was then used to tap the left L5 pedicle to a depth of nearly 421mobserved on C-arm fluoroscopy to be beyond the posterior one third of the lumbar vertebra and good position aligment within the right L5 pedicle this was then removed and the pedicle channel probed demonstrating patency no sign of rupture the cortex of the pedicle. Tapping with a  a 4.3536map, 5 mm tap, 6mm79mp and then 7 mm tap. Then 7.0mm 38m0 mm screw was placed on the right side at the L5 level. The pedicle channel of L5 on the right probed demonstrating patency no sign of rupture the cortex of the pedicle. C-arm fluoroscopy was then brought into the field and  using C-arm fluoroscopy then the hole into the pedicle of left S1 was placed and observed with ball-tipped probe the S1 pedicle on this side depth of 45 mm. Tapping performed to a 6 mm tap. A 7.0mm X61m mm carefully passed down the center of the S1 pedicle to a depth of nearly 45 mm. Observed on C-arm fluoroscopy to be in good position alignment channel was probed with a ball-tipped probe ensure patency no sign of cortical disruption. Following tapping with  4.35 mm screw tap, 5 mm tap, 6mm ta45mnd a 7.0 x 45 mm screw was placed on the left side pedicle at S1. C-arm fluoroscopy was used to localize the hole made in the medial aspect of the pedicle of S1 on the right localizing the pedicle within the spinal canal with nerve hook and hockey-stick nerve probe carefully passed down the center of the S1 pedicle to a depth of nearly 40 mm. Observed on C-arm fluoroscopy to be in good position alignment channel was probed with a ball-tipped probe ensure 7.0 mm x 40 pedicle screw was placed on the right side at the S1 level. Inferior left lamina of  L4 and L5 were then resected lower 50% with Leksell rongeur and partially on the right side at L4 and L5. The left  facet of L4-5 and L5-S1 were totally resected using osteotomes and kerrison ronguers in order to decompress the left side of the lumbar thecal sac at  L4-5 and L5-S1 and decompress the left L4 and L5 and S1 neuroforamen. Osteotomes and 2mm and84mm kerr32mns were used for this portion of the decompression. This provided for exposure of the left side L4-5 and L5-S1 neuroforamen for ease of placement of TLIFs (transforaminal lumbar interbody fusion) at each level inferior portions of the lamina and pars were also resected first beginning with the Leksell rongeur and osteotomes and then resecting using 2 and 3 mm Kerrison. Continued laminectomy was carried out resecting the central portions of the lamina of L4 and L5 performing foraminotomies on the left side at the  L4, L5 and S1 levels. The inferior articular process L4 and L5 were resected on the left side. The L5 nerve root identified bilaterally and the medial aspect of the L5 pedicle. 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 Loupe magnification and headlight were used during this portion procedure. The OR surgical microscope sterilely drape and brought into the field.   Attention then turned to placement of the transforaminal lumbar interbody fusion cages. Using a Penfield 4 the right lateral aspect of the thecal sac at the L4-5 disc space was carefully freed up The thecal sac could then easily be retracted in the posterior lateral aspect of the L4-5 disc was exposed 15 blade scalpel used to incise the posterolateral disc and an osteotome used to resect a small portion of bone off the superior aspect of the posterior superior vertebral body of L5 in order to ease the entry into the L4-5 disc space. A  28m kerrison rongeur was then able to be introduced in the disc space debrided it was quite narrow. 7 mm dilator shaver was used to dialate the L4-5 disc space on the right side further dilation  to 13 mm was successful and using small curettes and the disc space was debrided a minimal degenerative disc present in the endplates debrided to bleeding endplate bone. Trials the intervertebral disc space performed and a 13 mm trial cage provided the best fit. A 139mx 2770mepuy Lordotic ProTi concorde cage was carefully packed with morcellized bone graft and the been harvested from previous laminotomies.The cage was then inserted with the articulating insertion handle.  Additional bone graft was then  packed into the intervertebral disc space using cancellous allograft bone chips and vivigen and local bone graft. Bleeding controlled using bipolar electrocautery thrombin soaked gel cottonoids. Then turned to the right L5-S1 level similarly the exposure the posterior lateral aspect this was carried out using a Penfield 4 bipolar electrocautery to control small bleeders present. Derricho retractor used to retract the thecal sac and L5 nerve root a 15 blade scalpel was used to incise posterior lateral aspect of the right L5-S1disc the disc space at this level showed a rather severe narrowing posteriorly was more open anteriorly so that an osteotome again was used to resect a small portion the posterior superior lip of the vertebral body at S1  in order to gain ease of access into the L5-S1 disc space. The space was debrided of degenerative disc material using pituitary along root the entire disc space was then debrided of degenerative disc material using pituitary rongeurs curettage down to bleeding bone endplates. 9mm58mnd  10 mm shavers were used to debride the disc space and pituitary ronguers used to remove the loosened debris. This space was then carefully assess using spacers  a 11 mm trial cage provided the best fit, the Depuy Lordotic ProTi concorde cage 11mm20m7mm 36mchosen so that the permanent 11 mm cage by 27 mm Depuy Lordotic ProTi cage packed with local bone graft was placed into the intervertebral disc space. The posterior intervertebral disc space was then packed with autogenous local bone graft that been harvested from the central laminectomy in addition to allograft cancellous bone chips and vivigen. Bleeding controlled using bipolar electrocautery.  Observed on C-arm fluoroscopy to be in good position alignment. The cages at L4-5 and L5-S1 were placed anteriorly as best as possible the correct patient's kyphosis that was present. With this then the transforaminal lumbar interbody fusion portion  of the case was completed bleeders were controlled using bipolar electrocautery thrombin-soaked Gelfoam were  appropriate.Decortication of the facet joints carried out bilateral L4-5 and L5-S1. These were packed with cancellous local bone graft. 3 pedicle screws on the right already in place, the 3 viper corticofixation screws on the left were each placed and then each fastener carefully aligned  to allow for placement of rods. The right side first quarter inch titanium rod was then carefully contoured using the french benders and a precontoured 76m rod. This was then placed into the pedicle screws on the right extending from L4-S1 each of the caps were carefully placed loosely tightened. Attention turned to the left side were similarly and then screws were carefully adjusted to allow for a better pattern screws to allow for placement of fixation of the rod a quarter inch 65 mm precontoured titanium rod was then carefully contoured. This was able to be inserted into the left pedicle screw fasteners, Caps onto the L4 fasteners were tightened to 80 foot lbs. Across the right side  L4-5 and L5-S1 screw fasteners compression was obtained on the right side between L4 and L5, then L5 andS1 by compressing between the fasteners and tightening the screw caps 85 pounds. Similarly this was done on the left side at L4-5 and L5-S1 obtaining compression and tightened 85 pounds. Irrigation was carried out with copious amounts of saline solution this was done throughout the case. Cell Saver was used during the case. A single cross-link for the Depuy expedium system was placed at the L5-S1 level measured with the measuring tool and the appropriate A4 cross-link was then carefully contoured and applied to the rods and tightened again to 80 foot-pounds bilaterally using the appropriate torque screwdriver. Center screw on the cross-link was then carefully tightened 80 foot-pounds irrigation was carried out. Hockey stick neuroprobe was  used to probe the neuroforamen bilateral L4, L5 and S1, these were determined to be well decompressed. Permanent C-arm images were obtained in AP and lateral plane and oblique planes. Remaining local bone graft was then applied along both lateral posterior lateral region extending from L4 toS1 facet beds.Gelfoam was then removed spinal canal the lumbodorsal musculature carefully exam debrided of any devitalized tissue following removal of Viper retractors were the bleeders were controlled using electrocautery and the area dorsal lumbar muscle were then approximated in the midline with interrupted #1 Vicryl sutures loose the dorsal fascia was reattached to the spinous process of L3 to superiorly and S1 inferiorly this was done with #1 Vicryl sutures. Subcutaneous layers then approximated using interrupted 0 Vicryl sutures and 2-0 Vicryl sutures. Skin was closed with a running subcutaneous stitch of 4-0 Vicryl Dermabond was applied then MedPlex bandage. All instrument and sponge counts were correct. The patient was then returned to a supine position on her bed reactivated extubated and returned to the recovery room in satisfactory condition.   JBenjiman Core PA-C perform the duties of assistant surgeon during this case. He was present from the beginning of the case to the end of the case assisting in transfer the patient from his stretcher to the OR table and back to the stretcher at the end of the case. Assisted in careful retraction and suction of the laminectomy site delicate neural structures operating under the operating room microscope. He performed closure of the incision from the fascia to the skin applying the dressing.         JBasil Dess 07/13/2018, 3:33 PM

## 2018-07-13 NOTE — H&P (Signed)
[name] is an 57 y.o. female.   Chief Complaint: back pain and lower extremity radiculopathy HPI: patient with hx of L5-S1 Stenosis and above complaint presents for surgical intervention.  Progressively worsening symptoms.  Failed conservative treatment.    Past Medical History:  Diagnosis Date  . Breast cancer ([place]) 2017   right breast  . Breast cancer of lower-inner quadrant of right female breast ([place]) 08/08/2015  . Colon polyps   . DDD (degenerative disc disease), cervical   . Degenerative joint disease   . Diabetes mellitus without complication ([place])   . Diverticulosis   . Gallstones   . GERD (gastroesophageal reflux disease)   . Headache(784.0)    migraine like per patient  . Hypertension   . Low back pain    dr phillips, pain management  . Osteoarthritis   . Personal history of chemotherapy 2017  . Personal history of radiation therapy 2017    Past Surgical History:  Procedure Laterality Date  . CHOLECYSTECTOMY  10/28/2011   Procedure: LAPAROSCOPIC CHOLECYSTECTOMY WITH INTRAOPERATIVE CHOLANGIOGRAM;  Surgeon: Earnstine Regal, MD;  Location: [facility];  Service: General;  Laterality: N/A;  . COLONOSCOPY WITH ESOPHAGOGASTRODUODENOSCOPY (EGD)  02/03/2013   with Propofol  . EXAM UNDER ANESTHESIA WITH MANIPULATION OF KNEE Right 05/30/2003  . EXAM UNDER ANESTHESIA WITH MANIPULATION OF KNEE Left 12/27/2002  . GANGLION CYST EXCISION Right    right wrist  . KNEE ARTHROSCOPY Bilateral 01/02/2000  . KNEE ARTHROSCOPY Right   . LAPAROSCOPIC VAGINAL HYSTERECTOMY WITH SALPINGO OOPHORECTOMY Bilateral 07/13/2006  . LIPOMA EXCISION Right 02/21/2008   hip  . MASS EXCISION  08/24/2012   Procedure: EXCISION MASS;  Surgeon: Earnstine Regal, MD;  Location: [place];  Service: General;  Laterality: Left;  excisie soft tissue masses left shoulder(back) & left upper arm  . MASS EXCISION Left 01/24/2014   Procedure: EXCISION SOFT TISSUE MASSES LOWER LEFT BACK;  Surgeon: Earnstine Regal, MD;  Location: [place];  Service: General;  Laterality: Left;  Marland Kitchen MASS EXCISION Right 04/30/2016   Procedure: EXCISION OF SKIN RIGHT CHEST WALL;  Surgeon: Autumn Messing III, MD;  Location: [place] [facility];  Service: General;  Laterality: Right;  EXCISION OF SKIN RIGHT CHEST WALL  . MASTECTOMY Right 2017  . MASTECTOMY W/ SENTINEL NODE BIOPSY Right 10/01/2015   Procedure: RIGHT MASTECTOMY WITH SENTINEL LYMPH NODE BIOPSY;  Surgeon: Autumn Messing III, MD;  Location: [facility];  Service: General;  Laterality: Right;  . PORT-A-CATH REMOVAL Left 04/30/2016   Procedure: REMOVAL PORT-A-CATH;  Surgeon: Autumn Messing III, MD;  Location: [facility];  Service: General;  Laterality: Left;  REMOVAL PORT-A-CATH  . PORTACATH PLACEMENT Left 11/01/2015   Procedure: INSERTION PORT-A-CATH;  Surgeon: Autumn Messing III, MD;  Location: [facility];  Service: General;  Laterality: Left;  . REPLACEMENT UNICONDYLAR JOINT KNEE Left 04/20/2001  . REVISION TOTAL KNEE ARTHROPLASTY Right 06/18/2004  . REVISION TOTAL KNEE ARTHROPLASTY Left 11/15/2002  . REVISION TOTAL KNEE ARTHROPLASTY Left 10/12/2001  . REVISION TOTAL KNEE ARTHROPLASTY Right 07/09/2015  . TONSILLECTOMY    . TOTAL KNEE ARTHROPLASTY Right 04/25/2003  . TOTAL KNEE ARTHROPLASTY Left 06/25/2009    Family History  Problem Relation Age of Onset  . Stomach cancer Mother   . Heart disease Father   . Prostate cancer Father   . Kidney cancer Sister        one out of the four  . Hypertension Sister   .  Hypertension Brother   . Crohn's disease Other        nephew  . Colon cancer Maternal Grandmother   . Esophageal cancer Neg Hx    Social History:  reports that she has been smoking cigarettes. She has a 5.00 pack-year smoking history. She quit smokeless tobacco use about 2 years ago. She reports that she does not drink alcohol or use drugs.  Allergies:  Allergies  Allergen Reactions  . Codeine Itching, Nausea And  Vomiting and Other (See Comments)    Itching all over the body  . Latex Hives  . Sulfonamide Derivatives Hives and Other (See Comments)    All over the body    Medications Prior to Admission  Medication Sig Dispense Refill  . amitriptyline (ELAVIL) 25 MG tablet Take 50 mg by mouth at bedtime.     Marland Kitchen amLODipine (NORVASC) 5 MG tablet Take 2 tablets (10 mg total) by mouth once for 1 dose. (Patient taking differently: Take 10 mg by mouth daily. ) 180 tablet 3  . anastrozole (ARIMIDEX) 1 MG tablet TAKE 1 TABLET(1 MG) BY MOUTH DAILY (Patient taking differently: Take 1 mg by mouth at bedtime. ) 90 tablet 4  . KLOR-CON M20 20 MEQ tablet Take 20 mEq by mouth daily.     Marland Kitchen lisinopril (PRINIVIL,ZESTRIL) 10 MG tablet Take 2 tablets (20 mg total) by mouth once for 1 dose. (Patient taking differently: Take 10 mg by mouth daily. ) 180 tablet 3  . metFORMIN (GLUCOPHAGE) 500 MG tablet TAKE 1 TABLET BY MOUTH TWICE A DAY WITH A MEAL (Patient taking differently: Take 500 mg by mouth daily with breakfast. ) 60 tablet 8  . methadone (DOLOPHINE) 10 MG tablet Take 10 mg by mouth every 8 (eight) hours.     . metoprolol tartrate (LOPRESSOR) 50 MG tablet TAKE 1 TABLET BY MOUTH TWICE A DAY (Patient taking differently: Take 50 mg by mouth daily. ) 60 tablet 5  . omeprazole (PRILOSEC) 40 MG capsule TAKE 1 CAPSULE BY MOUTH EVERY DAY (Patient taking differently: Take 40 mg by mouth daily. ) 30 capsule 5  . oxyCODONE (ROXICODONE) 15 MG immediate release tablet Take 15 mg by mouth every 6 (six) hours as needed for pain.     Marland Kitchen zolpidem (AMBIEN) 10 MG tablet TAKE 1 TABLET BY MOUTH AT BEDTIME AS NEEDED FOR SLEEP (Patient taking differently: Take 10 mg by mouth at bedtime. ) 30 tablet 5  . methocarbamol (ROBAXIN-750) 750 MG tablet Take 1 tablet (750 mg total) by mouth every 6 (six) hours as needed for muscle spasms. (Patient not taking: Reported on 07/05/2018) 60 tablet 2  . ONE TOUCH ULTRA TEST test strip TEST ONCE DAILY (Patient not  taking: Reported on 07/05/2018) 25 each 3  . ONETOUCH DELICA LANCETS FINE MISC USE TO TEST ONCE DAILY DX CODE E11.9 (Patient not taking: Reported on 07/05/2018) 100 each 0  . pregabalin (LYRICA) 75 MG capsule Take 1 capsule (75 mg total) by mouth 2 (two) times daily. (Patient not taking: Reported on 07/05/2018) 60 capsule 0    Results for orders placed or performed during the hospital encounter of 07/13/18 (from the past 48 hour(s))  Glucose, capillary     Status: Abnormal   Collection Time: 07/13/18  5:58 AM  Result Value Ref Range   Glucose-Capillary 132 (H) 70 - 99 mg/dL   Comment 1 Notify RN    Comment 2 Document in Chart    No results found.  Review of Systems  Constitutional: Negative.   HENT: Negative.   Respiratory: Negative.   Cardiovascular: Negative.   Gastrointestinal: Negative.   Genitourinary: Negative.   Musculoskeletal: Positive for back pain.  Skin: Negative.   Neurological: Positive for tingling.  Psychiatric/Behavioral: Negative.     Blood pressure (!) 147/76, pulse 66, temperature 97.8 F (36.6 C), temperature source Oral, resp. rate 20, height 5\' 7"  (1.702 m), SpO2 95 %. Physical Exam  Constitutional: She is oriented to person, place, and time. No distress.  HENT:  Head: Normocephalic and atraumatic.  Eyes: Pupils are equal, round, and reactive to light.  Neck: Normal range of motion.  Cardiovascular: Normal heart sounds.  Respiratory: No respiratory distress.  GI: She exhibits no distension.  Musculoskeletal: She exhibits tenderness.  Neurological: She is alert and oriented to person, place, and time.  Skin: Skin is warm and dry.  Psychiatric: She has a normal mood and affect.     Assessment/Plan L4-S1 stenosis, back pain and lower radiculopathy  Will proceed with TRANSFORAMINAL LUMBAR INTERBODY FUSION RIGHT L4-5 AND RIGHT L5-S1 WITH SCREWS, RODS AND CAGES, LOCAL AND ALLOGRAFT BONE GRAFT AND VIVIGEN, BILATERAL DECOMPRESSIVE LAMINECTOMIES L4-5 AND  L5-S1 As scheduled.  Surgical procedure along with possible risks and complications discussed.   Benjiman Core, PA-C 07/13/2018, 7:16 AM

## 2018-07-13 NOTE — Anesthesia Procedure Notes (Signed)
Procedure Name: Intubation Date/Time: 07/13/2018 8:00 AM Performed by: Orlie Dakin, CRNA Pre-anesthesia Checklist: Patient identified, Emergency Drugs available, Suction available and Patient being monitored Patient Re-evaluated:Patient Re-evaluated prior to induction Oxygen Delivery Method: Circle system utilized Preoxygenation: Pre-oxygenation with 100% oxygen Induction Type: IV induction Ventilation: Mask ventilation without difficulty and Oral airway inserted - appropriate to patient size Laryngoscope Size: Mac and 4 Grade View: Grade III Tube type: Oral Tube size: 7.0 mm Number of attempts: 1 Airway Equipment and Method: Stylet Placement Confirmation: ETT inserted through vocal cords under direct vision,  positive ETCO2 and breath sounds checked- equal and bilateral Secured at: 22 cm Tube secured with: Tape Dental Injury: Teeth and Oropharynx as per pre-operative assessment  Comments: Noted floppy epiglottis with DL, anterior glottic opening.  4x4s bite block used at end of case.

## 2018-07-13 NOTE — Transfer of Care (Signed)
Immediate Anesthesia Transfer of Care Note  Patient: [name]  Procedure(s) Performed: TRANSFORAMINAL LUMBAR INTERBODY FUSION RIGHT L4-5 AND RIGHT L5-S1 WITH SCREWS, RODS AND CAGES, LOCAL AND ALLOGRAFT BONE GRAFT AND VIVIGEN, BILATERAL DECOMPRESSIVE LAMINECTOMIES L4-5 AND L5-S1 (N/A )  Patient Location: PACU  Anesthesia Type:General  Level of Consciousness: drowsy and patient cooperative  Airway & Oxygen Therapy: Patient Spontanous Breathing and Patient connected to face mask oxygen  Post-op Assessment: Report given to RN and Post -op Vital signs reviewed and stable  Post vital signs: Reviewed and stable  Last Vitals:  Vitals Value Taken Time  BP 145/83 07/13/2018  3:00 PM  Temp    Pulse 97 07/13/2018  3:01 PM  Resp 20 07/13/2018  3:01 PM  SpO2 98 % 07/13/2018  3:01 PM  Vitals shown include unvalidated device data.  Last Pain:  Vitals:   07/13/18 0602  TempSrc:   PainSc: 6          Complications: No apparent anesthesia complications

## 2018-07-14 LAB — CBC
HCT: 29.7 % — ABNORMAL LOW (ref 36.0–46.0)
Hemoglobin: 9.3 g/dL — ABNORMAL LOW (ref 12.0–15.0)
MCH: 28.5 pg (ref 26.0–34.0)
MCHC: 31.3 g/dL (ref 30.0–36.0)
MCV: 91.1 fL (ref 80.0–100.0)
Platelets: 180 10*3/uL (ref 150–400)
RBC: 3.26 MIL/uL — ABNORMAL LOW (ref 3.87–5.11)
RDW: 13.7 % (ref 11.5–15.5)
WBC: 8.2 10*3/uL (ref 4.0–10.5)
nRBC: 0 % (ref 0.0–0.2)

## 2018-07-14 LAB — GLUCOSE, CAPILLARY
Glucose-Capillary: 108 mg/dL — ABNORMAL HIGH (ref 70–99)
Glucose-Capillary: 129 mg/dL — ABNORMAL HIGH (ref 70–99)
Glucose-Capillary: 143 mg/dL — ABNORMAL HIGH (ref 70–99)
Glucose-Capillary: 147 mg/dL — ABNORMAL HIGH (ref 70–99)
Glucose-Capillary: 149 mg/dL — ABNORMAL HIGH (ref 70–99)

## 2018-07-14 LAB — BASIC METABOLIC PANEL
Anion gap: 7 (ref 5–15)
BUN: 13 mg/dL (ref 6–20)
CO2: 25 mmol/L (ref 22–32)
Calcium: 7.8 mg/dL — ABNORMAL LOW (ref 8.9–10.3)
Chloride: 110 mmol/L (ref 98–111)
Creatinine, Ser: 0.8 mg/dL (ref 0.44–1.00)
GFR calc Af Amer: >60 mL/min (ref >60)
GFR calc non Af Amer: >60 mL/min (ref >60)
Glucose, Bld: 90 mg/dL (ref 70–99)
Potassium: 3.1 mmol/L — ABNORMAL LOW (ref 3.5–5.1)
Sodium: 142 mmol/L (ref 135–145)

## 2018-07-14 MED ORDER — POTASSIUM CHLORIDE CRYS ER 20 MEQ PO TBCR
20.0000 meq | EXTENDED_RELEASE_TABLET | Freq: Two times a day (BID) | ORAL | Status: AC
  Administered 2018-07-14 – 2018-07-16 (×4): 20 meq via ORAL
  Filled 2018-07-14 (×4): qty 1

## 2018-07-14 MED FILL — Sodium Chloride IV Soln 0.9%: INTRAVENOUS | Qty: 2000 | Status: AC

## 2018-07-14 MED FILL — Thrombin (Recombinant) For Soln 20000 Unit: CUTANEOUS | Qty: 1 | Status: AC

## 2018-07-14 MED FILL — Heparin Sodium (Porcine) Inj 1000 Unit/ML: INTRAMUSCULAR | Qty: 30 | Status: AC

## 2018-07-14 NOTE — Evaluation (Signed)
Physical Therapy Evaluation Patient Details Name: [name] MRN: [id number] DOB: 11/30/1960 Today's Date: 07/14/2018   History of Present Illness  57 yo s/p TLIF L4-S1. WUJ:[id number] CA; OA (bil TKAs with multiple revisions).  Clinical Impression  Pt was able to walk a short distance into the hallway with the support of the RW.  Back education initiated and pt doing well.   PT to follow acutely for deficits listed below.      Follow Up Recommendations Home health PT;Supervision for mobility/OOB    Equipment Recommendations  None recommended by PT    Recommendations for Other Services   NA    Precautions / Restrictions Precautions Precautions: Back Precaution Booklet Issued: Yes (comment) Precaution Comments: back education handout given by OT, verbally reviewed by PT Required Braces or Orthoses: Spinal Brace Spinal Brace: Lumbar corset Restrictions Weight Bearing Restrictions: No      Mobility  Bed Mobility Overal bed mobility: Needs Assistance Bed Mobility: Sidelying to Sit   Sidelying to sit: Min assist       General bed mobility comments: Min assist to support trunk to get from side lying to sitting EOB.  Pt was already in side lying when I came in, so we verbally reviewed sleeping positions and log roll technique.   Transfers Overall transfer level: Needs assistance   Transfers: Sit to/from Stand Sit to Stand: Min guard            Ambulation/Gait Ambulation/Gait assistance: Min guard Gait Distance (Feet): 55 Feet Assistive device: Rolling walker (2 wheeled) Gait Pattern/deviations: Step-through pattern;Shuffle;Trunk flexed     General Gait Details: Verbal cues for upright posture during gait, reliance on RW for support.          Balance Overall balance assessment: Needs assistance Sitting-balance support: No upper extremity supported;Feet supported Sitting balance-Leahy Scale: Good     Standing balance support: Bilateral upper extremity  supported Standing balance-Leahy Scale: Poor Standing balance comment: reliant on RW in standing.                              Pertinent Vitals/Pain Pain Assessment: Faces Faces Pain Scale: Hurts even more Pain Location: back Pain Descriptors / Indicators: Aching Pain Intervention(s): Limited activity within patient's tolerance;Monitored during session;Repositioned    Home Living Family/patient expects to be discharged to:: Private residence Living Arrangements: Spouse/significant other Available Help at Discharge: Family;Available 24 hours/day(husband and daughter) Type of Home: House Home Access: Stairs to enter Entrance Stairs-Rails: Right;Left;Can reach both Entrance Stairs-Number of Steps: 3 Home Layout: One level Home Equipment: Walker - 2 wheels;Cane - single point;Shower seat - built in      Prior Function Level of Independence: Independent with assistive device(s)         Comments: uses a cane for gait     Hand Dominance   Dominant Hand: Right    Extremity/Trunk Assessment   Upper Extremity Assessment Upper Extremity Assessment: Defer to OT evaluation    Lower Extremity Assessment Lower Extremity Assessment: RLE deficits/detail;LLE deficits/detail RLE Deficits / Details: per pt report h/o bil TKA and revisions.  R knee with more ROM than left grossly to 90 degrees sitting EOB.  Strength and sensation equal and pt seems to have some resolution of her R LE symptoms she was having PTA.  LLE Deficits / Details: Left leg with limited knee flexion ROM due to multiple TKAs and revisions, ~75 degrees per gross assessment, so difficulty with sit  to stand transitions as this knee cannot flex to 90 degrees, but this is her baseline.     Cervical / Trunk Assessment Cervical / Trunk Assessment: Other exceptions(back fusion)  Communication   Communication: No difficulties  Cognition Arousal/Alertness: Awake/alert Behavior During Therapy: WFL for tasks  assessed/performed Overall Cognitive Status: Within Functional Limits for tasks assessed                                               Assessment/Plan    PT Assessment Patient needs continued PT services  PT Problem List Decreased strength;Decreased activity tolerance;Decreased balance;Decreased mobility;Decreased range of motion;Decreased knowledge of use of DME;Decreased knowledge of precautions;Obesity;Pain       PT Treatment Interventions DME instruction;Gait training;Stair training;Functional mobility training;Therapeutic activities;Therapeutic exercise;Balance training;Patient/family education    PT Goals (Current goals can be found in the Care Plan section)  Acute Rehab PT Goals Patient Stated Goal: to be independent PT Goal Formulation: With patient Time For Goal Achievement: 07/21/18 Potential to Achieve Goals: Good    Frequency Min 5X/week    AM-PAC PT "6 Clicks" Daily Activity  Outcome Measure Difficulty turning over in bed (including adjusting bedclothes, sheets and blankets)?: Unable Difficulty moving from lying on back to sitting on the side of the bed? : Unable Difficulty sitting down on and standing up from a chair with arms (e.g., wheelchair, bedside commode, etc,.)?: Unable Help needed moving to and from a bed to chair (including a wheelchair)?: A Little Help needed walking in hospital room?: A Little Help needed climbing 3-5 steps with a railing? : A Little 6 Click Score: 12    End of Session Equipment Utilized During Treatment: Back brace Activity Tolerance: Patient tolerated treatment well Patient left: Other (comment)(with OT in the bathroom)   PT Visit Diagnosis: Muscle weakness (generalized) (M62.81);Difficulty in walking, not elsewhere classified (R26.2);Other symptoms and signs involving the nervous system (R29.898);Pain Pain - Right/Left: (lower) Pain - part of body: (back)    Time: 3790-2409 PT Time Calculation (min) (ACUTE  ONLY): 19 min   Charges:          Wells Guiles B. Yisroel Mullendore, PT, DPT  Acute Rehabilitation [phone] pager #[phone] office   PT Evaluation $PT Eval Moderate Complexity: 1 Mod

## 2018-07-14 NOTE — Progress Notes (Signed)
Occupational Therapy Evaluation Patient Details Name: [name] MRN: [id number] DOB: 12-18-1960 Today's Date: 07/14/2018    History of Present Illness 57 yo s/p TLIF L4-S1. FXT:[id number] CA; OA.    Clinical Impression   PTA pt modified independent with ADL and mobility @ cane level. Pt mobilizing well with min guard A @ RW level. Requires Mod A with LB ADL. Will follow acutely to educate on ADL and mobility following back precautions with use of AE and DME. Anticipate pt will make good progress.     Follow Up Recommendations  No OT follow up;Supervision - Intermittent    Equipment Recommendations  3 in 1 bedside commode    Recommendations for Other Services       Precautions / Restrictions Precautions Precautions: Back Precaution Booklet Issued: Yes (comment) Required Braces or Orthoses: Spinal Brace Spinal Brace: Lumbar corset      Mobility Bed Mobility                  Transfers Overall transfer level: Needs assistance   Transfers: Sit to/from Stand Sit to Stand: Min guard              Balance Overall balance assessment: No apparent balance deficits (not formally assessed)                                         ADL either performed or assessed with clinical judgement   ADL Overall ADL's : Needs assistance/impaired     Grooming: Supervision/safety;Set up   Upper Body Bathing: Set up;Supervision/ safety;Sitting   Lower Body Bathing: Moderate assistance;Sit to/from stand   Upper Body Dressing : Minimal assistance;Sitting   Lower Body Dressing: Moderate assistance;Sit to/from stand   Toilet Transfer: Min guard;Ambulation   Toileting- Clothing Manipulation and Hygiene: Moderate assistance;Sit to/from stand       Functional mobility during ADLs: Min guard;Rolling walker General ADL Comments: Began education regarding back precautions and AE     Vision         Perception     Praxis      Pertinent Vitals/Pain  Pain Assessment: 0-10 Pain Score: 5  Pain Location: back Pain Descriptors / Indicators: Aching Pain Intervention(s): Limited activity within patient's tolerance     Hand Dominance Right   Extremity/Trunk Assessment Upper Extremity Assessment Upper Extremity Assessment: Overall WFL for tasks assessed   Lower Extremity Assessment Lower Extremity Assessment: Defer to PT evaluation(limited ROM L knee)   Cervical / Trunk Assessment Cervical / Trunk Assessment: Other exceptions(back fusion)   Communication Communication Communication: No difficulties   Cognition Arousal/Alertness: Awake/alert Behavior During Therapy: WFL for tasks assessed/performed Overall Cognitive Status: Within Functional Limits for tasks assessed                                     General Comments  demosntrated correct bed mobility technique    Exercises     Shoulder Instructions      Home Living Family/patient expects to be discharged to:: Private residence Living Arrangements: Spouse/significant other Available Help at Discharge: Family;Available 24 hours/day(husband and daughter) Type of Home: House Home Access: Stairs to enter [employer] of Steps: 3 Entrance Stairs-Rails: Right;Left;Can reach both Home Layout: One level     Bathroom Shower/Tub: Occupational psychologist: Standard Bathroom Accessibility: Yes How  Accessible: Accessible via walker Home Equipment: [place] - 2 wheels;Cane - single point;Shower seat - built in          Prior Functioning/Environment Level of Independence: Independent with assistive device(s)        Comments: uses a cane for gait        OT Problem List: Decreased strength;Decreased activity tolerance;Decreased range of motion;Decreased knowledge of use of DME or AE;Decreased knowledge of precautions;Obesity;Pain      OT Treatment/Interventions: Self-care/ADL training;DME and/or AE instruction;Therapeutic  activities;Patient/family education    OT Goals(Current goals can be found in the care plan section) Acute Rehab OT Goals Patient Stated Goal: to be indepednetnq OT Goal Formulation: With patient Time For Goal Achievement: 07/28/18 Potential to Achieve Goals: Good  OT Frequency: Min 3X/week   Barriers to D/C:            Co-evaluation              AM-PAC PT "6 Clicks" Daily Activity     Outcome Measure Help from another person eating meals?: None Help from another person taking care of personal grooming?: A Little Help from another person toileting, which includes using toliet, bedpan, or urinal?: A Little Help from another person bathing (including washing, rinsing, drying)?: A Little Help from another person to put on and taking off regular upper body clothing?: A Little Help from another person to put on and taking off regular lower body clothing?: A Little 6 Click Score: 19   End of Session Equipment Utilized During Treatment: Gait belt;Rolling walker;Back brace Nurse Communication: Mobility status  Activity Tolerance: Patient tolerated treatment well Patient left: in chair;with call bell/phone within reach  OT Visit Diagnosis: Other abnormalities of gait and mobility (R26.89);Pain Pain - part of body: (back)                Time: 8616-8372 OT Time Calculation (min): 17 min Charges:  OT General Charges $OT Visit: 1 Visit OT Evaluation $OT Eval Low Complexity: [place], OT/L   Acute OT Clinical Specialist Acute Rehabilitation Services Pager [phone] Office [phone]  07/14/2018, 5:22 PM

## 2018-07-14 NOTE — Progress Notes (Signed)
     Subjective: 1 Day Post-Op Procedure(s) (LRB): TRANSFORAMINAL LUMBAR INTERBODY FUSION RIGHT L4-5 AND RIGHT L5-S1 WITH SCREWS, RODS AND CAGES, LOCAL AND ALLOGRAFT BONE GRAFT AND VIVIGEN, BILATERAL DECOMPRESSIVE LAMINECTOMIES L4-5 AND L5-S1 (N/A) Awake. Alert and oriented x 4. IV is uncomfortable left wrist.   Patient reports pain as moderate.    Objective:   VITALS:  Temp:  [98.1 F (36.7 C)-99.6 F (37.6 C)] 99.6 F (37.6 C) (10/23 1630) Pulse Rate:  [74-99] 80 (10/23 1630) Resp:  [12-18] 18 (10/22 2006) BP: (114-145)/(48-86) 133/74 (10/23 1630) SpO2:  [88 %-100 %] 98 % (10/23 1225)  Neurologically intact ABD soft Neurovascular intact Sensation intact distally Intact pulses distally Dorsiflexion/Plantar flexion intact Incision: dressing C/D/I and no drainage   LABS Recent Labs    07/14/18 0406  HGB 9.3*  WBC 8.2  PLT 180   Recent Labs    07/14/18 0406  NA 142  K 3.1*  CL 110  CO2 25  BUN 13  CREATININE 0.80  GLUCOSE 90   No results for input(s): LABPT, INR in the last 72 hours.   Assessment/Plan: 1 Day Post-Op Procedure(s) (LRB): TRANSFORAMINAL LUMBAR INTERBODY FUSION RIGHT L4-5 AND RIGHT L5-S1 WITH SCREWS, RODS AND CAGES, LOCAL AND ALLOGRAFT BONE GRAFT AND VIVIGEN, BILATERAL DECOMPRESSIVE LAMINECTOMIES L4-5 AND L5-S1 (N/A)  Advance diet Up with therapy D/C IV fluids  Continue with PT/OT  Basil Dess 07/14/2018, 5:31 PMPatient ID: [name], female   DOB: 1961-03-12, 57 y.o.   MRN: [id number]

## 2018-07-14 NOTE — Progress Notes (Signed)
Orthopedic Tech Progress Note Patient Details:  [name] 08-Sep-1961 [id number] Called in bio-tech brace order; spoke with Bella Kennedy Patient ID: [name], female   DOB: 1961/05/31, 57 y.o.   MRN: [id number]   [name] 07/14/2018, 9:49 AM

## 2018-07-14 NOTE — Progress Notes (Signed)
Orthopedic Tech Progress Note Patient Details:  NILE DORNING 08-Feb-1961 [id number]  Patient ID: [name], female   DOB: 01/13/1961, 57 y.o.   MRN: [id number]   [name] 07/14/2018, 9:39 AM Called in bio-tech brace order; spoke with Bella Kennedy

## 2018-07-15 ENCOUNTER — Telehealth (INDEPENDENT_AMBULATORY_CARE_PROVIDER_SITE_OTHER): Payer: Self-pay | Admitting: Radiology

## 2018-07-15 LAB — BASIC METABOLIC PANEL
Anion gap: 8 (ref 5–15)
BUN: 15 mg/dL (ref 6–20)
CO2: 27 mmol/L (ref 22–32)
Calcium: 8.6 mg/dL — ABNORMAL LOW (ref 8.9–10.3)
Chloride: 103 mmol/L (ref 98–111)
Creatinine, Ser: 1.23 mg/dL — ABNORMAL HIGH (ref 0.44–1.00)
GFR calc Af Amer: 55 mL/min — ABNORMAL LOW (ref >60)
GFR calc non Af Amer: 47 mL/min — ABNORMAL LOW (ref >60)
Glucose, Bld: 122 mg/dL — ABNORMAL HIGH (ref 70–99)
Potassium: 3.3 mmol/L — ABNORMAL LOW (ref 3.5–5.1)
Sodium: 138 mmol/L (ref 135–145)

## 2018-07-15 LAB — CBC
HCT: 31 % — ABNORMAL LOW (ref 36.0–46.0)
Hemoglobin: 9.7 g/dL — ABNORMAL LOW (ref 12.0–15.0)
MCH: 29 pg (ref 26.0–34.0)
MCHC: 31.3 g/dL (ref 30.0–36.0)
MCV: 92.5 fL (ref 80.0–100.0)
Platelets: 198 10*3/uL (ref 150–400)
RBC: 3.35 MIL/uL — ABNORMAL LOW (ref 3.87–5.11)
RDW: 14.2 % (ref 11.5–15.5)
WBC: 10.6 10*3/uL — ABNORMAL HIGH (ref 4.0–10.5)
nRBC: 0 % (ref 0.0–0.2)

## 2018-07-15 LAB — GLUCOSE, CAPILLARY
Glucose-Capillary: 119 mg/dL — ABNORMAL HIGH (ref 70–99)
Glucose-Capillary: 120 mg/dL — ABNORMAL HIGH (ref 70–99)
Glucose-Capillary: 138 mg/dL — ABNORMAL HIGH (ref 70–99)

## 2018-07-15 MED ORDER — DIPHENHYDRAMINE HCL 25 MG PO CAPS
50.0000 mg | ORAL_CAPSULE | Freq: Three times a day (TID) | ORAL | Status: DC | PRN
  Administered 2018-07-15 – 2018-07-16 (×2): 50 mg via ORAL
  Filled 2018-07-15 (×2): qty 2

## 2018-07-15 NOTE — Telephone Encounter (Signed)
They need orders for Benadryl, they are saying patient is itching. Patient is in 4 N11

## 2018-07-15 NOTE — Progress Notes (Addendum)
Physical Therapy Treatment Patient Details Name: [name] MRN: [id number] DOB: 06/26/1961 Today's Date: 07/15/2018    History of Present Illness 57 yo s/p TLIF L4-S1. OFB:PZWCHE CA; OA (bil TKAs with multiple revisions).    PT Comments    Pt is progressing well with gait and mobility, but does fatigue with increased gait distance with RW today.  She will need to practice stairs prior to d/c home (likely tomorrow).  PT will continue to follow acutely for safe mobility progression    Follow Up Recommendations  Home health PT;Supervision for mobility/OOB     Equipment Recommendations  None recommended by PT    Recommendations for Other Services   NA     Precautions / Restrictions Precautions Precautions: Back Required Braces or Orthoses: Spinal Brace Spinal Brace: Lumbar corset    Mobility  Bed Mobility               General bed mobility comments: Pt was standing in bathroom with RN tech  Transfers Overall transfer level: Needs assistance Equipment used: Rolling walker (2 wheeled) Transfers: Sit to/from [facility] Sit to Stand: Supervision Stand pivot transfers: Supervision       General transfer comment: supervision for safety and cues for hand placement.   Ambulation/Gait Ambulation/Gait assistance: Min guard Gait Distance (Feet): 130 Feet Assistive device: Rolling walker (2 wheeled) Gait Pattern/deviations: Step-through pattern;Shuffle;Trunk flexed     General Gait Details: Verbal cues for upright posture, the further we got the slower she got.  She fatigued easily       Balance Overall balance assessment: Needs assistance Sitting-balance support: Feet supported;No upper extremity supported Sitting balance-Leahy Scale: Good     Standing balance support: Bilateral upper extremity supported Standing balance-Leahy Scale: Fair Standing balance comment: able to take hands off of RW in static standing for ADL tasks.                              Cognition Arousal/Alertness: Awake/alert Behavior During Therapy: WFL for tasks assessed/performed Overall Cognitive Status: Within Functional Limits for tasks assessed                                           General Comments General comments (skin integrity, edema, etc.): Verbally reviewed back precautions.  Pt able to report 2/3 (forgot twisting)      Pertinent Vitals/Pain Pain Assessment: Faces Faces Pain Scale: Hurts even more Pain Location: back Pain Descriptors / Indicators: Sore Pain Intervention(s): Limited activity within patient's tolerance;Monitored during session;Repositioned           PT Goals (current goals can now be found in the care plan section) Acute Rehab PT Goals Patient Stated Goal: to be independent Progress towards PT goals: Progressing toward goals    Frequency    Min 5X/week      PT Plan Current plan remains appropriate       AM-PAC PT "6 Clicks" Daily Activity  Outcome Measure  Difficulty turning over in bed (including adjusting bedclothes, sheets and blankets)?: Unable Difficulty moving from lying on back to sitting on the side of the bed? : Unable Difficulty sitting down on and standing up from a chair with arms (e.g., wheelchair, bedside commode, etc,.)?: Unable Help needed moving to and from a bed to chair (including a wheelchair)?: A Little Help needed walking  in hospital room?: A Little Help needed climbing 3-5 steps with a railing? : A Little 6 Click Score: 12    End of Session Equipment Utilized During Treatment: Gait belt;Back brace Activity Tolerance: Patient limited by fatigue Patient left: in chair;with call bell/phone within reach   PT Visit Diagnosis: Muscle weakness (generalized) (M62.81);Difficulty in walking, not elsewhere classified (R26.2);Other symptoms and signs involving the nervous system (R29.898);Pain Pain - Right/Left: (lower) Pain - part of body: (back)      Time: 1959-7471 PT Time Calculation (min) (ACUTE ONLY): 21 min  Charges:  $Gait Training: 8-22 mins                    Hasaan Radde B. Pavel Gadd, PT, DPT  Acute Rehabilitation [phone] pager #[phone] office   07/15/2018, 7:54 PM

## 2018-07-15 NOTE — Progress Notes (Signed)
Occupational Therapy Treatment Patient Details Name: [name] MRN: [id number] DOB: 12/25/1960 Today's Date: 07/15/2018    History of present illness 57 yo s/p TLIF L4-S1. YSA:YTKZSW CA; OA (bil TKAs with multiple revisions).   OT comments  Making excellent progress. Focus of session on education on use of AE for LB ADL and pericare after toileting. Pt will benefit from an additional session prior to DC. Pt very appreciative.   Follow Up Recommendations  No OT follow up;Supervision - Intermittent    Equipment Recommendations  3 in 1 bedside commode    Recommendations for Other Services      Precautions / Restrictions Precautions Precautions: Back Required Braces or Orthoses: Spinal Brace Spinal Brace: Lumbar corset       Mobility Bed Mobility Overal bed mobility: Needs Assistance Bed Mobility: Sit to Sidelying         Sit to sidelying: Min assist General bed mobility comments: to A with LLE  Transfers Overall transfer level: Needs assistance   Transfers: Sit to/from Stand;Stand Pivot Transfers Sit to Stand: Supervision Stand pivot transfers: Supervision            Balance     Sitting balance-Leahy Scale: Good       Standing balance-Leahy Scale: Fair Standing balance comment: able to release RW for ADL                           ADL either performed or assessed with clinical judgement   ADL                                       Functional mobility during ADLs: Supervision/safety;Rolling walker General ADL Comments: Educated pt on use of AE for LB ADL; pt able to return demonstrate use of AE with min vc; demosntrated use of toilet tongs for pericare. Pt very appreciative of information; Pt to bring shoes in tomorrow a dn wrok on ADL prior to DC; Educated on need for 3in1 - pt verbalized understnaidng.     Vision       Perception     Praxis      Cognition Arousal/Alertness: Awake/alert Behavior During Therapy:  WFL for tasks assessed/performed Overall Cognitive Status: Within Functional Limits for tasks assessed                                          Exercises     Shoulder Instructions       General Comments      Pertinent Vitals/ Pain       Pain Assessment: 0-10 Pain Score: 3  Pain Location: back Pain Descriptors / Indicators: Sore Pain Intervention(s): Limited activity within patient's tolerance  Home Living                                          Prior Functioning/Environment              Frequency  Min 3X/week        Progress Toward Goals  OT Goals(current goals can now be found in the care plan section)  Progress towards OT goals: Progressing toward goals  Acute Rehab OT Goals Patient Stated  Goal: to be independent OT Goal Formulation: With patient Time For Goal Achievement: 07/28/18 Potential to Achieve Goals: Good ADL Goals Pt Will Perform Lower Body Bathing: with modified independence;sit to/from stand;with adaptive equipment Pt Will Perform Lower Body Dressing: with modified independence;with adaptive equipment;sit to/from stand Pt Will Transfer to Toilet: with modified independence;ambulating;bedside commode Pt Will Perform Toileting - Clothing Manipulation and hygiene: with modified independence;with adaptive equipment;sit to/from stand Additional ADL Goal #1: Pt will indepenently verbalize 3/3 back precautions  Plan Discharge plan remains appropriate    Co-evaluation                 AM-PAC PT "6 Clicks" Daily Activity     Outcome Measure   Help from another person eating meals?: None Help from another person taking care of personal grooming?: None Help from another person toileting, which includes using toliet, bedpan, or urinal?: A Little Help from another person bathing (including washing, rinsing, drying)?: A Little Help from another person to put on and taking off regular upper body clothing?:  None Help from another person to put on and taking off regular lower body clothing?: A Little 6 Click Score: 21    End of Session Equipment Utilized During Treatment: Rolling walker;Back brace  OT Visit Diagnosis: Other abnormalities of gait and mobility (R26.89);Pain Pain - part of body: (back)   Activity Tolerance Patient tolerated treatment well   Patient Left in bed;with call bell/phone within reach   Nurse Communication Mobility status        Time: 6063-0160 OT Time Calculation (min): 32 min  Charges: OT General Charges $OT Visit: 1 Visit OT Treatments $Self Care/Home Management : 23-37 mins  Maurie Boettcher, OT/L   Acute OT Clinical Specialist [place] Pager [phone] Office [phone]    [facility] 07/15/2018, 1:33 PM

## 2018-07-15 NOTE — Progress Notes (Signed)
Patient c/o itching on back at area of dressing.  Itching occurred when dressing was changed to honeycomb dressing.  Honeycomb dressing removed and replaced with Mepilex.  Itching has improved since dressing change but still present.  Area on back around dressing slightly red, no other areas of redness noted.  Patient denies any difficulty with breathing.  Patient requesting Benadryl.  Dr. Otho Ket PA notified, he recommends holding on Benadryl to ensure itching isn't related to medication reaction.  Will monitor closely.

## 2018-07-16 DIAGNOSIS — M5136 Other intervertebral disc degeneration, lumbar region: Secondary | ICD-10-CM | POA: Diagnosis present

## 2018-07-16 DIAGNOSIS — M4726 Other spondylosis with radiculopathy, lumbar region: Secondary | ICD-10-CM | POA: Diagnosis present

## 2018-07-16 DIAGNOSIS — M51369 Other intervertebral disc degeneration, lumbar region without mention of lumbar back pain or lower extremity pain: Secondary | ICD-10-CM | POA: Diagnosis present

## 2018-07-16 LAB — CBC
HCT: 30.1 % — ABNORMAL LOW (ref 36.0–46.0)
Hemoglobin: 9.2 g/dL — ABNORMAL LOW (ref 12.0–15.0)
MCH: 28.3 pg (ref 26.0–34.0)
MCHC: 30.6 g/dL (ref 30.0–36.0)
MCV: 92.6 fL (ref 80.0–100.0)
Platelets: 210 10*3/uL (ref 150–400)
RBC: 3.25 MIL/uL — ABNORMAL LOW (ref 3.87–5.11)
RDW: 13.8 % (ref 11.5–15.5)
WBC: 8.6 10*3/uL (ref 4.0–10.5)
nRBC: 0.2 % (ref 0.0–0.2)

## 2018-07-16 LAB — BASIC METABOLIC PANEL
Anion gap: 7 (ref 5–15)
BUN: 11 mg/dL (ref 6–20)
CO2: 25 mmol/L (ref 22–32)
Calcium: 8.6 mg/dL — ABNORMAL LOW (ref 8.9–10.3)
Chloride: 105 mmol/L (ref 98–111)
Creatinine, Ser: 1.03 mg/dL — ABNORMAL HIGH (ref 0.44–1.00)
GFR calc Af Amer: >60 mL/min (ref >60)
GFR calc non Af Amer: 59 mL/min — ABNORMAL LOW (ref >60)
Glucose, Bld: 109 mg/dL — ABNORMAL HIGH (ref 70–99)
Potassium: 3.7 mmol/L (ref 3.5–5.1)
Sodium: 137 mmol/L (ref 135–145)

## 2018-07-16 LAB — GLUCOSE, CAPILLARY
Glucose-Capillary: 132 mg/dL — ABNORMAL HIGH (ref 70–99)
Glucose-Capillary: 122 mg/dL — ABNORMAL HIGH (ref 70–99)
Glucose-Capillary: 120 mg/dL — ABNORMAL HIGH (ref 70–99)
Glucose-Capillary: 137 mg/dL — ABNORMAL HIGH (ref 70–99)

## 2018-07-16 MED ORDER — OXYCODONE HCL ER 20 MG PO T12A: 20.0000 mg | EXTENDED_RELEASE_TABLET | Freq: Two times a day (BID) | ORAL | 0 refills | Status: AC

## 2018-07-16 MED ORDER — ONDANSETRON HCL 4 MG PO TABS: 4.0000 mg | ORAL_TABLET | Freq: Four times a day (QID) | ORAL | 0 refills | Status: DC | PRN

## 2018-07-16 MED ORDER — OXYCODONE HCL 10 MG PO TABS: 10.0000 mg | ORAL_TABLET | ORAL | 0 refills | Status: AC | PRN

## 2018-07-16 MED ORDER — DIPHENHYDRAMINE HCL 50 MG PO CAPS: 50.0000 mg | ORAL_CAPSULE | Freq: Three times a day (TID) | ORAL | 0 refills | Status: DC | PRN

## 2018-07-16 MED ORDER — ANASTROZOLE 1 MG PO TABS: 1.0000 mg | ORAL_TABLET | Freq: Every day | ORAL | 3 refills | Status: DC

## 2018-07-16 MED ORDER — AMLODIPINE BESYLATE 10 MG PO TABS: 10.0000 mg | ORAL_TABLET | Freq: Every day | ORAL | 3 refills | Status: DC

## 2018-07-16 MED ORDER — METHOCARBAMOL 750 MG PO TABS: 750.0000 mg | ORAL_TABLET | Freq: Four times a day (QID) | ORAL | 2 refills | Status: DC | PRN

## 2018-07-16 MED ORDER — BACLOFEN 10 MG PO TABS
10.0000 mg | ORAL_TABLET | Freq: Two times a day (BID) | ORAL | Status: DC
  Administered 2018-07-16 – 2018-07-17 (×3): 10 mg via ORAL
  Filled 2018-07-16 (×3): qty 1

## 2018-07-16 NOTE — Plan of Care (Signed)
Patient progressed well through night.  Patient ambulated with walker and required moderate assistance.  Vitals remained stable outside of minor fever which was treated with tylenol.

## 2018-07-16 NOTE — Care Management Note (Signed)
Case Management Note  Patient Details  Name: MERNA BALDI MRN: [id number] Date of Birth: 1961/06/30  Subjective/Objective:    57 yo s/p TLIF L4-S1.  PTA, pt independent with assistive devices; lives with spouse.                 Action/Plan: PT/OT recommending HH follow up, and pt agreeable to services.  Referral to [facility], per pt choice; start of care 24-48h post dc date.  Referral to [facility] for 3 in 1 BSC.  Pt has RW at home.    Expected Discharge Date:                  Expected Discharge Plan:  [place]  In-House Referral:     Discharge planning Services  CM Consult  Post Acute Care Choice:  Home Health Choice offered to:  Patient  DME Arranged:  3-N-1 DME Agency:  [place]:  PT, OT [place] Agency:  [facility]  Status of Service:  Completed, signed off  If discussed at [employer] of Stay Meetings, dates discussed:    Additional Comments:  Reinaldo Raddle, RN, BSN  Trauma/Neuro ICU Case Manager [phone]

## 2018-07-16 NOTE — Progress Notes (Signed)
Physical Therapy Treatment Patient Details Name: [name] MRN: [id number] DOB: 1961-01-08 Today's Date: 07/16/2018    History of Present Illness 57 yo s/p TLIF L4-S1. JAS:[id number] CA; OA (bil TKAs with multiple revisions).    PT Comments    Pt progressing toward PT goals; incr activity tolerance and gait distance today; reviewed up/down stairs and pt tolerated well; she is hopeful to d/c today; continue PT POC  Follow Up Recommendations  Home health PT;Supervision for mobility/OOB     Equipment Recommendations  None recommended by PT    Recommendations for Other Services       Precautions / Restrictions Precautions Precautions: Back Precaution Comments: verbally reviewed back precautions with pt Required Braces or Orthoses: Spinal Brace Spinal Brace: Lumbar corset Restrictions Weight Bearing Restrictions: No    Mobility  Bed Mobility Overal bed mobility: Needs Assistance Bed Mobility: Rolling;Sidelying to Sit Rolling: Supervision Sidelying to sit: Min guard       General bed mobility comments: light use of rail, HOB elevated 30*, cues for knee flexion and log roll, min-guard to bring trunk to upright  Transfers Overall transfer level: Needs assistance Equipment used: Rolling walker (2 wheeled) Transfers: Sit to/from Stand Sit to Stand: Supervision         General transfer comment: supervision for safety and cues for hand placement.   Ambulation/Gait Ambulation/Gait assistance: Min guard;Supervision Gait Distance (Feet): 220 Feet (110' x2--sitting brief rest between, after stairs) Assistive device: Rolling walker (2 wheeled) Gait Pattern/deviations: Step-through pattern;Decreased stride length     General Gait Details: verbal cues for upright posture, neutral spine and step length   Stairs Stairs: Yes Stairs assistance: Min assist Stair Management: One rail Right;Forwards;Step to pattern Number of Stairs: 3 General stair comments: verbal cues  for technique, safety, posture   Wheelchair Mobility    Modified Rankin (Stroke Patients Only)       Balance             Standing balance-Leahy Scale: Fair Standing balance comment: UE support needed for dynamic tasks                            Cognition Arousal/Alertness: Awake/alert Behavior During Therapy: WFL for tasks assessed/performed Overall Cognitive Status: Within Functional Limits for tasks assessed                                        Exercises      General Comments        Pertinent Vitals/Pain Pain Score: 4  Pain Location: back Pain Descriptors / Indicators: Sore Pain Intervention(s): Monitored during session;Repositioned    Home Living                      Prior Function            PT Goals (current goals can now be found in the care plan section) Acute Rehab PT Goals Patient Stated Goal: to be independent PT Goal Formulation: With patient Time For Goal Achievement: 07/21/18 Potential to Achieve Goals: Good Progress towards PT goals: Progressing toward goals    Frequency    Min 5X/week      PT Plan Current plan remains appropriate    Co-evaluation              AM-PAC PT "6 Clicks" Daily Activity  Outcome Measure  Difficulty turning over in bed (including adjusting bedclothes, sheets and blankets)?: Unable Difficulty moving from lying on back to sitting on the side of the bed? : Unable Difficulty sitting down on and standing up from a chair with arms (e.g., wheelchair, bedside commode, etc,.)?: Unable Help needed moving to and from a bed to chair (including a wheelchair)?: A Little Help needed walking in hospital room?: A Little Help needed climbing 3-5 steps with a railing? : A Little 6 Click Score: 12    End of Session Equipment Utilized During Treatment: Gait belt;Back brace Activity Tolerance: Patient tolerated treatment well Patient left: in chair;with call bell/phone within  reach   PT Visit Diagnosis: Muscle weakness (generalized) (M62.81);Difficulty in walking, not elsewhere classified (R26.2);Other symptoms and signs involving the nervous system (R29.898);Pain     Time: 9147-8295 PT Time Calculation (min) (ACUTE ONLY): 24 min  Charges:  $Gait Training: 23-37 mins                     Kenyon Ana, PT  Pager: [phone] Acute Rehab Dept [facility]): [phone]   07/16/2018    [facility] 07/16/2018, 1:04 PM

## 2018-07-16 NOTE — Progress Notes (Signed)
     Subjective: 3 Days Post-Op Procedure(s) (LRB): TRANSFORAMINAL LUMBAR INTERBODY FUSION RIGHT L4-5 AND RIGHT L5-S1 WITH SCREWS, RODS AND CAGES, LOCAL AND ALLOGRAFT BONE GRAFT AND VIVIGEN, BILATERAL DECOMPRESSIVE LAMINECTOMIES L4-5 AND L5-S1 (N/A)  Patient reports pain as moderate.    Objective:   VITALS:  Temp:  [98.3 F (36.8 C)-100.4 F (38 C)] 98.4 F (36.9 C) (10/25 1201) Pulse Rate:  [64-85] 64 (10/25 1201) BP: (124-150)/(64-75) 126/68 (10/25 1201) SpO2:  [93 %-98 %] 98 % (10/25 0855) Awake alert and oriented x4. Pain in the right buttock with spasm,    Neurologically intact ABD soft Neurovascular intact Sensation intact distally Intact pulses distally Dorsiflexion/Plantar flexion intact Incision: no drainage No cellulitis present   LABS Recent Labs    07/14/18 0406 07/15/18 0415  HGB 9.3* 9.7*  WBC 8.2 10.6*  PLT 180 198   Recent Labs    07/14/18 0406 07/15/18 0415  NA 142 138  K 3.1* 3.3*  CL 110 103  CO2 25 27  BUN 13 15  CREATININE 0.80 1.23*  GLUCOSE 90 122*   No results for input(s): LABPT, INR in the last 72 hours.   Assessment/Plan: 3 Days Post-Op Procedure(s) (LRB): TRANSFORAMINAL LUMBAR INTERBODY FUSION RIGHT L4-5 AND RIGHT L5-S1 WITH SCREWS, RODS AND CAGES, LOCAL AND ALLOGRAFT BONE GRAFT AND VIVIGEN, BILATERAL DECOMPRESSIVE LAMINECTOMIES L4-5 AND L5-S1 (N/A)  Advance diet Up with therapy  Check CBC and BMET as her creatinine was slightly elevated yesterday, may be hypovolemia. Start baclofen for spasm.   Basil Dess 07/16/2018, 1:04 PMPatient ID: [name], female   DOB: 1961/04/19, 57 y.o.   MRN: [id number]

## 2018-07-16 NOTE — Progress Notes (Signed)
     Subjective: 3 Days Post-Op Procedure(s) (LRB): TRANSFORAMINAL LUMBAR INTERBODY FUSION RIGHT L4-5 AND RIGHT L5-S1 WITH SCREWS, RODS AND CAGES, LOCAL AND ALLOGRAFT BONE GRAFT AND VIVIGEN, BILATERAL DECOMPRESSIVE LAMINECTOMIES L4-5 AND L5-S1 (N/A)  Patient reports pain as marked.    Objective:   VITALS:  Temp:  [98.3 F (36.8 C)-100.4 F (38 C)] 98.7 F (37.1 C) (10/25 1558) Pulse Rate:  [64-85] 73 (10/25 1558) BP: (118-150)/(53-75) 118/53 (10/25 1558) SpO2:  [93 %-99 %] 99 % (10/25 1558) Increased muscle spasm today. Legs N-V normal. Dressing intact but rolling up inferiorly, no BM. Worked out with PT and OT.   Neurologically intact ABD soft Neurovascular intact Sensation intact distally Intact pulses distally Dorsiflexion/Plantar flexion intact Incision: dressing C/D/I and no drainage   LABS Recent Labs    07/14/18 0406 07/15/18 0415 07/16/18 1431  HGB 9.3* 9.7* 9.2*  WBC 8.2 10.6* 8.6  PLT 180 198 210   Recent Labs    07/15/18 0415 07/16/18 1431  NA 138 137  K 3.3* 3.7  CL 103 105  CO2 27 25  BUN 15 11  CREATININE 1.23* 1.03*  GLUCOSE 122* 109*   No results for input(s): LABPT, INR in the last 72 hours.   Assessment/Plan: 3 Days Post-Op Procedure(s) (LRB): TRANSFORAMINAL LUMBAR INTERBODY FUSION RIGHT L4-5 AND RIGHT L5-S1 WITH SCREWS, RODS AND CAGES, LOCAL AND ALLOGRAFT BONE GRAFT AND VIVIGEN, BILATERAL DECOMPRESSIVE LAMINECTOMIES L4-5 AND L5-S1 (N/A)  Hgb is stable, K has returned to normal. Creatinine is normal.  Muscular spasm probably secondary to disc space irritation. Will increase muscle relaxers.   Advance diet Up with therapy Plan for discharge tomorrow and she is in agreement.  Basil Dess 07/16/2018, 5:15 PMPatient ID: [name], female   DOB: 10-02-1960, 57 y.o.   MRN: [id number]

## 2018-07-16 NOTE — Discharge Instructions (Signed)

## 2018-07-16 NOTE — Progress Notes (Signed)
OT Progress Note  Continued education on use of AE to help with LB ADL. Pt verbalized understanding. Pt's husband works during the day and pt's daughter should be able to assist after DC. Pt would like HHOT. Will continue to follow acutely.    07/16/18 1700  OT Visit Information  Last OT Received On 07/16/18  Assistance Needed +1  History of Present Illness 57 yo s/p TLIF L4-S1. BSW:HQPRFF CA; OA (bil TKAs with multiple revisions).  Precautions  Precautions Back  Required Braces or Orthoses Spinal Brace  Spinal Brace Lumbar corset  Pain Assessment  Pain Assessment 0-10  Pain Score 8  Pain Location back  Pain Descriptors / Indicators Sore;Cramping  Pain Intervention(s) Limited activity within patient's tolerance;Patient requesting pain meds-RN notified;Ice applied;Repositioned  Cognition  Arousal/Alertness Awake/alert  Behavior During Therapy WFL for tasks assessed/performed  Overall Cognitive Status Within Functional Limits for tasks assessed  Upper Extremity Assessment  Upper Extremity Assessment Overall WFL for tasks assessed  Lower Extremity Assessment  Lower Extremity Assessment Defer to PT evaluation  ADL  General ADL Comments Educated pt on use of elastic shoestrings to convert tennis shoes into "slip on shoes. Pt verbalized understanidng. Pt educated on use of long handled sponge. Pt in increased pian. Ice applied to back and pt educated on side lying as option.   OT - End of Session  Activity Tolerance Patient limited by pain  Patient left in bed;with call bell/phone within reach  Nurse Communication Patient requests pain meds  OT Assessment/Plan  OT Plan Discharge plan remains appropriate  OT Visit Diagnosis Other abnormalities of gait and mobility (R26.89);Pain  Pain - part of body  (back)  Follow Up Recommendations Home health OT;Supervision - Intermittent  OT Equipment 3 in 1 bedside commode  AM-PAC OT "6 Clicks" Daily Activity Outcome Measure  Help from another  person eating meals? 4  Help from another person taking care of personal grooming? 4  Help from another person toileting, which includes using toliet, bedpan, or urinal? 3  Help from another person bathing (including washing, rinsing, drying)? 3  Help from another person to put on and taking off regular upper body clothing? 4  Help from another person to put on and taking off regular lower body clothing? 3  6 Click Score 21  ADL G Code Conversion CJ  OT Goal Progression  Progress towards OT goals Progressing toward goals  Acute Rehab OT Goals  Patient Stated Goal to be independent  OT Goal Formulation With patient  Time For Goal Achievement 07/28/18  Potential to Achieve Goals Good  ADL Goals  Pt Will Perform Lower Body Bathing with modified independence;sit to/from stand;with adaptive equipment  Pt Will Perform Lower Body Dressing with modified independence;with adaptive equipment;sit to/from stand  Pt Will Transfer to Toilet with modified independence;ambulating;bedside commode  Pt Will Perform Toileting - Clothing Manipulation and hygiene with modified independence;with adaptive equipment;sit to/from stand  Additional ADL Goal #1 Pt will indepenently verbalize 3/3 back precautions  OT Time Calculation  OT Start Time (ACUTE ONLY) 1639  OT Stop Time (ACUTE ONLY) 1651  OT Time Calculation (min) 12 min  OT General Charges  $OT Visit 1 Visit  OT Treatments  $Self Care/Home Management  8-22 mins  Maurie Boettcher, OT/L   Acute OT Clinical Specialist [place] Pager [phone] Office [phone]

## 2018-07-16 NOTE — Progress Notes (Addendum)
Occupational Therapy Treatment Patient Details Name: [name] MRN: [id number] DOB: 05/30/1961 Today's Date: 07/16/2018    History of present illness 57 yo s/p TLIF L4-S1. WER:XVQMGQ CA; OA (bil TKAs with multiple revisions).   OT comments  Pt continues to make excellent progress. Moving slowly but moves well. Focus of session on use of AE and DME for ADL. Pt able to return demonstrate. Pt will benefit from further OT to maximize functional level of independence.  Pt appropriate to DC home when medically stable.   Follow Up Recommendations  HH OT follow up;Supervision - Intermittent    Equipment Recommendations  3 in 1 bedside commode    Recommendations for Other Services      Precautions / Restrictions Precautions Precautions: Back Required Braces or Orthoses: Spinal Brace Spinal Brace: Lumbar corset       Mobility Bed Mobility             Sit to sidelying: Min assist General bed mobility comments: Min A to lift LLE onto bed. Pt states husband will be able to assist  Transfers Overall transfer level: Needs assistance Equipment used: Rolling walker (2 wheeled) Transfers: Sit to/from Stand Sit to Stand: Supervision Stand pivot transfers: Supervision            Balance             Standing balance-Leahy Scale: Fair                             ADL either performed or assessed with clinical judgement   ADL                          transferred onto toilet and completed toilet hygiene using toilet aid. Pt able to donn doff panties and socks with AE. Will need elastic laces.                General ADL Comments: Completed education regarding use of AE for ADL tasks. Pt able to return demonstrate. REviewed home set up to increase independence with ADL tasks adn reduce risk of falls.      Vision       Perception     Praxis      Cognition Arousal/Alertness: Awake/alert Behavior During Therapy: WFL for tasks  assessed/performed Overall Cognitive Status: Within Functional Limits for tasks assessed                                          Exercises     Shoulder Instructions       General Comments      Pertinent Vitals/ Pain       Pain Assessment: 0-10 Pain Score: 5  Pain Location: back Pain Descriptors / Indicators: Sore;Cramping Pain Intervention(s): Limited activity within patient's tolerance;Premedicated before session;Repositioned  Home Living                                          Prior Functioning/Environment              Frequency  Min 3X/week        Progress Toward Goals  OT Goals(current goals can now be found in the care plan section)  Progress towards OT  goals: Progressing toward goals  Acute Rehab OT Goals Patient Stated Goal: to be independent OT Goal Formulation: With patient Time For Goal Achievement: 07/28/18 Potential to Achieve Goals: Good ADL Goals Pt Will Perform Lower Body Bathing: with modified independence;sit to/from stand;with adaptive equipment Pt Will Perform Lower Body Dressing: with modified independence;with adaptive equipment;sit to/from stand Pt Will Transfer to Toilet: with modified independence;ambulating;bedside commode Pt Will Perform Toileting - Clothing Manipulation and hygiene: with modified independence;with adaptive equipment;sit to/from stand Additional ADL Goal #1: Pt will indepenently verbalize 3/3 back precautions  Plan Discharge plan remains appropriate    Co-evaluation                 AM-PAC PT "6 Clicks" Daily Activity     Outcome Measure   Help from another person eating meals?: None Help from another person taking care of personal grooming?: None Help from another person toileting, which includes using toliet, bedpan, or urinal?: A Little Help from another person bathing (including washing, rinsing, drying)?: A Little Help from another person to put on and taking off  regular upper body clothing?: None Help from another person to put on and taking off regular lower body clothing?: A Little 6 Click Score: 21    End of Session Equipment Utilized During Treatment: Rolling walker;Back brace  OT Visit Diagnosis: Other abnormalities of gait and mobility (R26.89);Pain Pain - part of body: (back)   Activity Tolerance Patient tolerated treatment well   Patient Left in bed;with call bell/phone within reach   Nurse Communication Mobility status        Time: 9407-6808 OT Time Calculation (min): 35 min  Charges: OT General Charges $OT Visit: 1 Visit OT Treatments $Self Care/Home Management : 23-37 mins  Maurie Boettcher, OT/L   Acute OT Clinical Specialist [place] Pager [phone] Office [phone]    [facility] 07/16/2018, 4:11 PM

## 2018-07-17 LAB — GLUCOSE, CAPILLARY: Glucose-Capillary: 118 mg/dL — ABNORMAL HIGH (ref 70–99)

## 2018-07-17 NOTE — Progress Notes (Addendum)
     Subjective: 4 Days Post-Op Procedure(s) (LRB): TRANSFORAMINAL LUMBAR INTERBODY FUSION RIGHT L4-5 AND RIGHT L5-S1 WITH SCREWS, RODS AND CAGES, LOCAL AND ALLOGRAFT BONE GRAFT AND VIVIGEN, BILATERAL DECOMPRESSIVE LAMINECTOMIES L4-5 AND L5-S1 (N/A)Awake, alert and oriented x 4.She is tolerating po meds and nourishment. Moderate pain. Voiding without difficulty. Patient reports no BM but nursing indicates that patient had large evacuation yesterday. No numbness or paresthesias.   Patient reports pain as moderate.    Objective:   VITALS:  Temp:  [98.1 F (36.7 C)-98.7 F (37.1 C)] 98.1 F (36.7 C) (10/26 0803) Pulse Rate:  [64-78] 72 (10/26 0803) Resp:  [18] 18 (10/26 0803) BP: (118-143)/(53-78) 124/68 (10/26 0803) SpO2:  [91 %-99 %] 94 % (10/26 0803)  Neurologically intact ABD soft Neurovascular intact Sensation intact distally Intact pulses distally Dorsiflexion/Plantar flexion intact Incision: dressing C/D/I and no drainage No cellulitis present   LABS Recent Labs    07/15/18 0415 07/16/18 1431  HGB 9.7* 9.2*  WBC 10.6* 8.6  PLT 198 210   Recent Labs    07/15/18 0415 07/16/18 1431  NA 138 137  K 3.3* 3.7  CL 103 105  CO2 27 25  BUN 15 11  CREATININE 1.23* 1.03*  GLUCOSE 122* 109*   No results for input(s): LABPT, INR in the last 72 hours.   Assessment/Plan: 4 Days Post-Op Procedure(s) (LRB): TRANSFORAMINAL LUMBAR INTERBODY FUSION RIGHT L4-5 AND RIGHT L5-S1 WITH SCREWS, RODS AND CAGES, LOCAL AND ALLOGRAFT BONE GRAFT AND VIVIGEN, BILATERAL DECOMPRESSIVE LAMINECTOMIES L4-5 AND L5-S1 (N/A)  Mild anemia due to blood loss during surgery.   Advance diet Up with therapy Discharge home with home health Allow to shower today,then change dressing with new waffle dressing. Fleets enema if she wishes before going home.  Basil Dess 07/17/2018, 9:18 AMPatient ID: [name], female   DOB: 1961/03/07, 57 y.o.   MRN: [id number]

## 2018-07-17 NOTE — Progress Notes (Signed)
Patient ready for discharge. She is alert and oriented and vital signs are stable. She has all of her belongings with her. I have discussed discharge instructions with her and all questions regarding her discharge have been answered. IV has been removed without complication and catheter intact. Patient will be transported home by her son who is currently here with her. She will leave unit wearing ASPEN back brace and by wheelchair and will  Meet her son at the front entrance of the hospital.

## 2018-07-18 DIAGNOSIS — M4807 Spinal stenosis, lumbosacral region: Secondary | ICD-10-CM | POA: Diagnosis not present

## 2018-07-18 DIAGNOSIS — M5136 Other intervertebral disc degeneration, lumbar region: Secondary | ICD-10-CM | POA: Diagnosis not present

## 2018-07-18 DIAGNOSIS — Z4789 Encounter for other orthopedic aftercare: Secondary | ICD-10-CM | POA: Diagnosis not present

## 2018-07-19 ENCOUNTER — Other Ambulatory Visit: Payer: Self-pay | Admitting: Family Medicine

## 2018-07-21 DIAGNOSIS — M5136 Other intervertebral disc degeneration, lumbar region: Secondary | ICD-10-CM | POA: Diagnosis not present

## 2018-07-21 DIAGNOSIS — M4807 Spinal stenosis, lumbosacral region: Secondary | ICD-10-CM | POA: Diagnosis not present

## 2018-07-21 DIAGNOSIS — Z4789 Encounter for other orthopedic aftercare: Secondary | ICD-10-CM | POA: Diagnosis not present

## 2018-07-23 NOTE — Discharge Summary (Signed)
Patient ID: [name] MRN: [id number] DOB/AGE: 01/03/1961 57 y.o.  Admit date: 07/13/2018 Discharge date: 07/23/2018  Admission Diagnoses:  Principal Problem:   Other spondylosis with radiculopathy, lumbar region Active Problems:   S/P lumbar fusion   DDD (degenerative disc disease), lumbar   Discharge Diagnoses:  Principal Problem:   Other spondylosis with radiculopathy, lumbar region Active Problems:   S/P lumbar fusion   DDD (degenerative disc disease), lumbar  status post Procedure(s): TRANSFORAMINAL LUMBAR INTERBODY FUSION RIGHT L4-5 AND RIGHT L5-S1 WITH SCREWS, RODS AND CAGES, LOCAL AND ALLOGRAFT BONE GRAFT AND VIVIGEN, BILATERAL DECOMPRESSIVE LAMINECTOMIES L4-5 AND L5-S1  Past Medical History:  Diagnosis Date  . Breast cancer ([place]) 2017   right breast  . Breast cancer of lower-inner quadrant of right female breast ([place]) 08/08/2015  . Colon polyps   . DDD (degenerative disc disease), cervical   . Degenerative joint disease   . Diabetes mellitus without complication ([place])   . Diverticulosis   . Gallstones   . GERD (gastroesophageal reflux disease)   . Headache(784.0)    migraine like per patient  . Hypertension   . Low back pain    dr phillips, pain management  . Osteoarthritis   . Personal history of chemotherapy 2017  . Personal history of radiation therapy 2017    Surgeries: Procedure(s): TRANSFORAMINAL LUMBAR INTERBODY FUSION RIGHT L4-5 AND RIGHT L5-S1 WITH SCREWS, RODS AND CAGES, LOCAL AND ALLOGRAFT BONE GRAFT AND VIVIGEN, BILATERAL DECOMPRESSIVE LAMINECTOMIES L4-5 AND L5-S1 on 07/13/2018   Consultants:   Discharged Condition: Improved  Hospital Course: [name] is an 57 y.o. female who was admitted 07/13/2018 for operative treatment of Other spondylosis with radiculopathy, lumbar region. Patient failed conservative treatments (please see the history and physical for the specifics) and had severe unremitting pain that affects sleep, daily  activities and work/hobbies. After pre-op clearance, the patient was taken to the operating room on 07/13/2018 and underwent  Procedure(s): TRANSFORAMINAL LUMBAR INTERBODY FUSION RIGHT L4-5 AND RIGHT L5-S1 WITH SCREWS, RODS AND CAGES, LOCAL AND ALLOGRAFT BONE GRAFT AND VIVIGEN, BILATERAL DECOMPRESSIVE LAMINECTOMIES L4-5 AND L5-S1.    Patient was given perioperative antibiotics:  Anti-infectives (From admission, onward)   Start     Dose/Rate Route Frequency Ordered Stop   07/13/18 1815  ceFAZolin (ANCEF) IVPB 2g/100 mL premix     2 g 200 mL/hr over 30 Minutes Intravenous Every 8 hours 07/13/18 1812 07/14/18 0153   07/13/18 1810  ceFAZolin (ANCEF) 2-4 GM/100ML-% IVPB    Note to Pharmacy:  Carney Living   : cabinet override      07/13/18 1810 07/14/18 0629   07/13/18 0737  ceFAZolin (ANCEF) 2-4 GM/100ML-% IVPB    Note to Pharmacy:  Vania Rea   : cabinet override      07/13/18 0737 07/13/18 0811   07/13/18 0730  ceFAZolin (ANCEF) IVPB 2g/100 mL premix     2 g 200 mL/hr over 30 Minutes Intravenous On call to O.R. 07/13/18 0722 07/13/18 1226       Patient was given sequential compression devices and early ambulation to prevent DVT.   Patient benefited maximally from hospital stay and there were no complications. At the time of discharge, the patient was urinating/moving their bowels without difficulty, tolerating a regular diet, pain is controlled with oral pain medications and they have been cleared by PT/OT.   Recent vital signs: No data found.   Recent laboratory studies: No results for input(s): WBC, HGB, HCT, PLT, NA, K, CL, CO2,  BUN, CREATININE, GLUCOSE, INR, CALCIUM in the last 72 hours.  Invalid input(s): PT, 2   Discharge Medications:   Allergies as of 07/17/2018      Reactions   Codeine Itching, Nausea And Vomiting, Other (See Comments)   Itching all over the body   Latex Hives   Sulfonamide Derivatives Hives, Other (See Comments)   All over the body   Gabapentin Other  (See Comments)   headache   Lyrica [pregabalin] Other (See Comments)   headache      Medication List    STOP taking these medications   methadone 10 MG tablet Commonly known as:  DOLOPHINE   oxyCODONE 15 MG immediate release tablet Commonly known as:  ROXICODONE Replaced by:  oxyCODONE 20 mg 12 hr tablet   zolpidem 10 MG tablet Commonly known as:  AMBIEN     TAKE these medications   amitriptyline 25 MG tablet Commonly known as:  ELAVIL Take 50 mg by mouth at bedtime.   amLODipine 10 MG tablet Commonly known as:  NORVASC Take 1 tablet (10 mg total) by mouth daily. What changed:    medication strength  when to take this   anastrozole 1 MG tablet Commonly known as:  ARIMIDEX Take 1 tablet (1 mg total) by mouth at bedtime.   diphenhydrAMINE 50 MG capsule Commonly known as:  BENADRYL Take 1 capsule (50 mg total) by mouth every 8 (eight) hours as needed for itching, allergies or sleep.   KLOR-CON M20 20 MEQ tablet Generic drug:  potassium chloride SA Take 20 mEq by mouth daily.   lisinopril 10 MG tablet Commonly known as:  PRINIVIL,ZESTRIL Take 2 tablets (20 mg total) by mouth once for 1 dose. What changed:    how much to take  when to take this   metFORMIN 500 MG tablet Commonly known as:  GLUCOPHAGE TAKE 1 TABLET BY MOUTH TWICE A DAY WITH A MEAL What changed:  See the new instructions.   methocarbamol 750 MG tablet Commonly known as:  ROBAXIN Take 1 tablet (750 mg total) by mouth every 6 (six) hours as needed for muscle spasms.   metoprolol tartrate 50 MG tablet Commonly known as:  LOPRESSOR TAKE 1 TABLET BY MOUTH TWICE A DAY What changed:  when to take this   omeprazole 40 MG capsule Commonly known as:  PRILOSEC TAKE 1 CAPSULE BY MOUTH EVERY DAY What changed:  how much to take   ondansetron 4 MG tablet Commonly known as:  ZOFRAN Take 1 tablet (4 mg total) by mouth every 6 (six) hours as needed for nausea or vomiting.   ONE TOUCH ULTRA TEST  test strip Generic drug:  glucose blood TEST ONCE DAILY   ONETOUCH DELICA LANCETS FINE Misc USE TO TEST ONCE DAILY DX CODE E11.9   oxyCODONE 20 mg 12 hr tablet Commonly known as:  OXYCONTIN Take 1 tablet (20 mg total) by mouth every 12 (twelve) hours for 7 days. Replaces:  oxyCODONE 15 MG immediate release tablet   Oxycodone HCl 10 MG Tabs Take 1 tablet (10 mg total) by mouth every 4 (four) hours as needed for up to 7 days for severe pain ((score 7 to 10)).   pregabalin 75 MG capsule Commonly known as:  LYRICA Take 1 capsule (75 mg total) by mouth 2 (two) times daily.       Diagnostic Studies: Dg Lumbar Spine Complete  Result Date: 07/13/2018 CLINICAL DATA:  For L4-5 and L5-S1 fusion EXAM: LUMBAR SPINE - COMPLETE 4+  VIEW COMPARISON:  CT abdomen pelvis of 05/07/2017 FINDINGS: Three C-arm spot films were returned. These show posterior hardware for fusion of L4-5 and L5-S1. Interbody fusion plugs are in good position with no complicating features. IMPRESSION: Hardware for posterior fusion at L4-5 and L5-S1. No complicating features. Electronically Signed   By: Ivar Drape M.D.   On: 07/13/2018 16:13   Dg C-arm 1-60 Min  Result Date: 07/13/2018 CLINICAL DATA:  For L4-5 and L5-S1 fusion EXAM: DG C-ARM 61-120 MIN COMPARISON:  CT abdomen pelvis of 05/07/2018 FINDINGS: C-arm fluoroscopy was provided during posterior fusion at L4-5 and L5-S1. Fluoroscopy time of 2 minutes 11 seconds was recorded. IMPRESSION: C-arm fluoroscopy provided. Electronically Signed   By: Ivar Drape M.D.   On: 07/13/2018 16:12   Dg C-arm 1-60 Min  Result Date: 07/13/2018 CLINICAL DATA:  Lumbar spine surgery. EXAM: DG C-ARM 61-120 MIN COMPARISON:  MRI 10/31/2017. FINDINGS: L4-L5 and L5-S1 posterior interbody fusion. Hardware intact. Anatomic alignment. No acute bony abnormality. 2 hour 11 seconds fluoroscopy time. IMPRESSION: Lumbar spine surgery. Electronically Signed   By: Marcello Moores  Register   On: 07/13/2018 15:29    Dg C-arm 1-60 Min  Result Date: 07/13/2018 CLINICAL DATA:  Lumbar surgery. EXAM: DG C-ARM 61-120 MIN COMPARISON:  MRI 10/31/2017. FINDINGS: Lumbar spine numbered as per prior MRI. L4-L5 and L5-S1 posterior and interbody fusion. Hardware intact. Anatomic alignment. No acute bony abnormality. 2 minutes 11 seconds fluoroscopy time. IMPRESSION: L4-L5 and L5-S1 posterior interbody fusion. Hardware intact. Anatomic alignment. Electronically Signed   By: Marcello Moores  Register   On: 07/13/2018 15:27    Discharge Instructions    Call MD / Call 911   Complete by:  As directed    If you experience chest pain or shortness of breath, CALL 911 and be transported to the hospital emergency room.  If you develope a fever above 101 F, pus (white drainage) or increased drainage or redness at the wound, or calf pain, call your surgeon's office.   Constipation Prevention   Complete by:  As directed    Drink plenty of fluids.  Prune juice may be helpful.  You may use a stool softener, such as Colace (over the counter) 100 mg twice a day.  Use MiraLax (over the counter) for constipation as needed.   Diet - low sodium heart healthy   Complete by:  As directed    Discharge instructions   Complete by:  As directed    Call if there is increasing drainage, fever greater than 101.5, severe head aches, and worsening nausea or light sensitivity. If shortness of breath, bloody cough or chest tightness or pain go to an emergency room. No lifting greater than 10 lbs. Avoid bending, stooping and twisting. Use brace when sitting and out of bed even to go to bathroom. Walk in house for first 2 weeks then may start to get out slowly increasing distances up to one mile by 4-6 weeks post op. After 5 days may shower and change dressing following bathing with shower.When bathing remove the brace shower and replace brace before getting out of the shower. If drainage, keep dry dressing and do not bathe the incision, use an moisture  impervious dressing. Please call and return for scheduled follow up appointment 2 weeks from the time of surgery.   Driving restrictions   Complete by:  As directed    No driving for 8 weeks   Increase activity slowly as tolerated   Complete by:  As directed  Lifting restrictions   Complete by:  As directed    No lifting for 12 weeks      Follow-up Information    [name], MD In 2 weeks.   Specialty:  Orthopedic Surgery Why:  For wound re-check Contact information: [address] [address] [phone]           Discharge Plan:  discharge to home  Disposition:     Signed: Benjiman Core for Basil Dess Md 07/23/2018, 11:18 AM

## 2018-07-26 DIAGNOSIS — Z4789 Encounter for other orthopedic aftercare: Secondary | ICD-10-CM | POA: Diagnosis not present

## 2018-07-26 DIAGNOSIS — M150 Primary generalized (osteo)arthritis: Secondary | ICD-10-CM | POA: Diagnosis not present

## 2018-07-26 DIAGNOSIS — M4807 Spinal stenosis, lumbosacral region: Secondary | ICD-10-CM | POA: Diagnosis not present

## 2018-07-26 DIAGNOSIS — G894 Chronic pain syndrome: Secondary | ICD-10-CM | POA: Diagnosis not present

## 2018-07-26 DIAGNOSIS — M5136 Other intervertebral disc degeneration, lumbar region: Secondary | ICD-10-CM | POA: Diagnosis not present

## 2018-07-26 DIAGNOSIS — Z79891 Long term (current) use of opiate analgesic: Secondary | ICD-10-CM | POA: Diagnosis not present

## 2018-07-28 DIAGNOSIS — M5136 Other intervertebral disc degeneration, lumbar region: Secondary | ICD-10-CM | POA: Diagnosis not present

## 2018-07-28 DIAGNOSIS — Z4789 Encounter for other orthopedic aftercare: Secondary | ICD-10-CM | POA: Diagnosis not present

## 2018-07-28 DIAGNOSIS — M4807 Spinal stenosis, lumbosacral region: Secondary | ICD-10-CM | POA: Diagnosis not present

## 2018-07-29 ENCOUNTER — Ambulatory Visit (INDEPENDENT_AMBULATORY_CARE_PROVIDER_SITE_OTHER): Payer: Self-pay

## 2018-07-29 ENCOUNTER — Encounter (INDEPENDENT_AMBULATORY_CARE_PROVIDER_SITE_OTHER): Payer: Self-pay | Admitting: Surgery

## 2018-07-29 ENCOUNTER — Ambulatory Visit (INDEPENDENT_AMBULATORY_CARE_PROVIDER_SITE_OTHER): Payer: 59 | Admitting: Surgery

## 2018-07-29 VITALS — BP 138/82 | HR 71 | Temp 97.3°F | Ht 67.0 in | Wt 256.2 lb

## 2018-07-29 DIAGNOSIS — Z981 Arthrodesis status: Secondary | ICD-10-CM

## 2018-07-29 NOTE — Progress Notes (Signed)
57 year old white female who is 2 weeks status post L4-5 and L5-S1 fusion returns.  States that she is doing very well and pleased with her progress up at this point.  Preop leg symptoms are much improved.  Established with the pain clinic so does not need medication from [place].  Exam Very pleasant female alert and oriented in no acute distress.  Wound looks good.  Staples removed and Steri-Strips applied.  No drainage or signs of infection.  Neurologically intact.  Plan Advised patient that she was to be compliant with wearing her brace at least 12 weeks postop.  No bending, twisting, lifting.  Follow-up in 4 weeks for recheck.  Return sooner if needed.

## 2018-07-30 ENCOUNTER — Telehealth: Payer: Self-pay | Admitting: *Deleted

## 2018-07-30 DIAGNOSIS — Z170 Estrogen receptor positive status [ER+]: Principal | ICD-10-CM

## 2018-07-30 DIAGNOSIS — C50311 Malignant neoplasm of lower-inner quadrant of right female breast: Secondary | ICD-10-CM

## 2018-07-30 NOTE — Telephone Encounter (Signed)
Lab order entered.

## 2018-08-02 ENCOUNTER — Inpatient Hospital Stay: Payer: 59 | Attending: Oncology | Admitting: Oncology

## 2018-08-02 ENCOUNTER — Telehealth: Payer: Self-pay | Admitting: Oncology

## 2018-08-02 ENCOUNTER — Inpatient Hospital Stay: Payer: 59

## 2018-08-02 VITALS — BP 174/82 | HR 75 | Temp 98.3°F | Resp 18 | Ht 67.0 in | Wt 225.5 lb

## 2018-08-02 DIAGNOSIS — Z170 Estrogen receptor positive status [ER+]: Secondary | ICD-10-CM | POA: Diagnosis not present

## 2018-08-02 DIAGNOSIS — M4807 Spinal stenosis, lumbosacral region: Secondary | ICD-10-CM | POA: Diagnosis not present

## 2018-08-02 DIAGNOSIS — C50311 Malignant neoplasm of lower-inner quadrant of right female breast: Secondary | ICD-10-CM | POA: Insufficient documentation

## 2018-08-02 DIAGNOSIS — Z4789 Encounter for other orthopedic aftercare: Secondary | ICD-10-CM | POA: Diagnosis not present

## 2018-08-02 DIAGNOSIS — M5136 Other intervertebral disc degeneration, lumbar region: Secondary | ICD-10-CM | POA: Diagnosis not present

## 2018-08-02 DIAGNOSIS — F17210 Nicotine dependence, cigarettes, uncomplicated: Secondary | ICD-10-CM | POA: Diagnosis not present

## 2018-08-02 DIAGNOSIS — I10 Essential (primary) hypertension: Secondary | ICD-10-CM | POA: Diagnosis not present

## 2018-08-02 DIAGNOSIS — Z79899 Other long term (current) drug therapy: Secondary | ICD-10-CM | POA: Diagnosis not present

## 2018-08-02 LAB — CBC WITH DIFFERENTIAL (CANCER CENTER ONLY)
Abs Immature Granulocytes: 0.02 10*3/uL (ref 0.00–0.07)
Basophils Absolute: 0 10*3/uL (ref 0.0–0.1)
Basophils Relative: 0 %
Eosinophils Absolute: 0.1 10*3/uL (ref 0.0–0.5)
Eosinophils Relative: 2 %
HCT: 33.6 % — ABNORMAL LOW (ref 36.0–46.0)
Hemoglobin: 10.8 g/dL — ABNORMAL LOW (ref 12.0–15.0)
Immature Granulocytes: 0 %
Lymphocytes Relative: 35 %
Lymphs Abs: 2.9 10*3/uL (ref 0.7–4.0)
MCH: 29.1 pg (ref 26.0–34.0)
MCHC: 32.1 g/dL (ref 30.0–36.0)
MCV: 90.6 fL (ref 80.0–100.0)
Monocytes Absolute: 0.5 10*3/uL (ref 0.1–1.0)
Monocytes Relative: 5 %
Neutro Abs: 4.7 10*3/uL (ref 1.7–7.7)
Neutrophils Relative %: 58 %
Platelet Count: 417 10*3/uL — ABNORMAL HIGH (ref 150–400)
RBC: 3.71 MIL/uL — ABNORMAL LOW (ref 3.87–5.11)
RDW: 13.9 % (ref 11.5–15.5)
WBC Count: 8.3 10*3/uL (ref 4.0–10.5)
nRBC: 0 % (ref 0.0–0.2)

## 2018-08-02 LAB — CMP (CANCER CENTER ONLY)
ALT: 8 U/L (ref 0–44)
AST: 11 U/L — ABNORMAL LOW (ref 15–41)
Albumin: 4 g/dL (ref 3.5–5.0)
Alkaline Phosphatase: 83 U/L (ref 38–126)
Anion gap: 12 (ref 5–15)
BUN: 14 mg/dL (ref 6–20)
CO2: 28 mmol/L (ref 22–32)
Calcium: 9.7 mg/dL (ref 8.9–10.3)
Chloride: 102 mmol/L (ref 98–111)
Creatinine: 1.03 mg/dL — ABNORMAL HIGH (ref 0.44–1.00)
GFR, Est AFR Am: >60 mL/min (ref >60)
GFR, Estimated: 59 mL/min — ABNORMAL LOW (ref >60)
Glucose, Bld: 110 mg/dL — ABNORMAL HIGH (ref 70–99)
Potassium: 3.3 mmol/L — ABNORMAL LOW (ref 3.5–5.1)
Sodium: 142 mmol/L (ref 135–145)
Total Bilirubin: <0.2 mg/dL — ABNORMAL LOW (ref 0.3–1.2)
Total Protein: 7.5 g/dL (ref 6.5–8.1)

## 2018-08-02 MED ORDER — TAMOXIFEN CITRATE 20 MG PO TABS: 20.0000 mg | ORAL_TABLET | Freq: Every day | ORAL | 12 refills | Status: AC

## 2018-08-02 NOTE — Progress Notes (Signed)
[place]  [phone] Fax:([phone]) [phone]   ID: [name] DOB: January 07, 1961  MR#: [id number]  JFH#:[id number]  Patient Care Team: Laurey Morale, MD as PCP - General Jovita Kussmaul, MD as Consulting Physician (General Surgery) Dorisann Schwanke, Virgie Dad, MD as Consulting Physician (Oncology) Tyler Pita, MD as Consulting Physician (Radiation Oncology) Nicholaus Bloom, MD (Anesthesiology) Delice Bison, Charlestine Massed, NP as Nurse Practitioner (Hematology and Oncology) [name], MD as Consulting Physician (Orthopedic Surgery) PCP: Laurey Morale, MD GYN: OTHER MD: Mardi Mainland MD, Nicholaus Bloom MD  CHIEF COMPLAINT: Multicentric estrogen receptor positive breast cancer  CURRENT TREATMENT: To start tamoxifen 09/22/2018  BREAST CANCER HISTORY: From the original intake note:  [name] herself noted a change in her right breast in October and brought it to the attention of her primary care physician. Her last mammogram had been April 2010. Dr. Sarajane Jews set [name] up for bilateral diagnostic mammography with tomosynthesis and [facility] 04[facility]11/2015. This found the breast density to be category B. In the right breast there were 3 microlobulated masses in the lower inner quadrant measuring 1.9, 1.5, and 1.5 cm. These were multicentric. There were also diffuse fine pleomorphic calcifications surrounding these masses, that area spanning 11.6 cm. The masses were palpable by exam at 4:00 8 cm from the nipple and at 5:00 3 cm from the nipple. By ultrasonography there was an irregular hypoechoic mass in the right breast at 4:00 measuring 1.7 cm, 1 medial to this measuring 1.1 cm, and then the third irregular hypoechoic mass at the 5:00 position measuring 1.3 cm. The right axilla showed multiple lymph nodes with thickened cortices. The largest lymph node measured 0.9 cm.  On 08/02/2015, [name] underwent biopsy of all 3 breast  masses as well as a right axillary lymph node. 2 of the tumors were similar invasive ductal carcinomas, grade 2, estrogen receptor 95% positive, progesterone receptor 5% positive, with an MIB-1 of 10%, and HER-2 equivocal, with a signals ratio of 1.30 and the number per cell 4.0. The third tumor appeared's grade 1 or 2 was also estrogen receptor positive at 95%, progesterone receptor positive at 5%, with an MIP-1 of 5%, and a similar HER-2 profile the signals ratio being 1.46 but the number per cell being only 3.95. By immunohistochemistry on this tumor HER-2 was negative at 1+. Immunohistochemistry on the equivocal reading is pending.  [name]'s subsequent history is as detailed below  INTERVAL HISTORY: [name] returns today for follow-up and treatment of her estrogen receptor positive breast cancer accompanied by her husband.  She continues on anastrozole and is doing "alright ".  She has noticed her hair thinning She has some body aches. She has sever hot flashes but she "deals with them."She also complains of insomnia but that is less likely to be related to the anastrozole  Since her last visit she has had diagnostic mammogram of left breast on 12/24/17 that showed: Stable left breast mammogram. No focal abnormality within the left axilla. ACR Breast Density Category b: There are scattered areas of fibroglandular density.  Also on 07/13/2018 she underwent right L4-5 and right L5-S1 fusion, with screws rods and cages and allograft bone graft under Basil Dess.  She is undergoing physical therapy and she tells me she walked half a mile yesterday with physical therapy's help  REVIEW OF SYSTEMS: [name] reports that for exercise, she does her physical therapy. Is in a lot of pain due to her back surgery  recently and is wearing body brace. She notes she did not even remember leaving hospital and coming home due to the medication she was on. Her staples were removed last Thursday and was given a cane to help  her with her mobility. Her PT is going good can tolerate the pain surprisingly well.Her physical therapist made her walk half a mile  [place] but her back starts to hurt and takes pain medication. She denies unusual headaches, visual changes, nausea, vomiting, or dizziness. There has been no unusual cough, phlegm production, or pleurisy. This been no change in bowel or bladder habits. She denies unexplained fatigue or unexplained weight loss, bleeding, rash, or fever. A detailed review of systems was otherwise stable.   PAST MEDICAL HISTORY: Past Medical History:  Diagnosis Date  . Breast cancer ([place]) 2017   right breast  . Breast cancer of lower-inner quadrant of right female breast ([place]) 08/08/2015  . Colon polyps   . DDD (degenerative disc disease), cervical   . Degenerative joint disease   . Diabetes mellitus without complication ([place])   . Diverticulosis   . Gallstones   . GERD (gastroesophageal reflux disease)   . Headache(784.0)    migraine like per patient  . Hypertension   . Low back pain    dr phillips, pain management  . Osteoarthritis   . Personal history of chemotherapy 2017  . Personal history of radiation therapy 2017    PAST SURGICAL HISTORY: Past Surgical History:  Procedure Laterality Date  . CHOLECYSTECTOMY  10/28/2011   Procedure: LAPAROSCOPIC CHOLECYSTECTOMY WITH INTRAOPERATIVE CHOLANGIOGRAM;  Surgeon: Earnstine Regal, MD;  [facility];  Service: General;  Laterality: N/A;  . COLONOSCOPY WITH ESOPHAGOGASTRODUODENOSCOPY (EGD)  02/03/2013   with Propofol  . EXAM UNDER ANESTHESIA WITH MANIPULATION OF KNEE Right 05/30/2003  . EXAM UNDER ANESTHESIA WITH MANIPULATION OF KNEE Left 12/27/2002  . GANGLION CYST EXCISION Right    right wrist  . KNEE ARTHROSCOPY Bilateral 01/02/2000  . KNEE ARTHROSCOPY Right   . LAPAROSCOPIC VAGINAL HYSTERECTOMY WITH SALPINGO OOPHORECTOMY Bilateral 07/13/2006  . LIPOMA EXCISION Right 02/21/2008   hip  . MASS EXCISION   08/24/2012   Procedure: EXCISION MASS;  Surgeon: Earnstine Regal, MD;  Location: [facility];  Service: General;  Laterality: Left;  excisie soft tissue masses left shoulder(back) & left upper arm  . MASS EXCISION Left 01/24/2014   Procedure: EXCISION SOFT TISSUE MASSES LOWER LEFT BACK;  Surgeon: Earnstine Regal, MD;  Location: [facility];  Service: General;  Laterality: Left;  Marland Kitchen MASS EXCISION Right 04/30/2016   Procedure: EXCISION OF SKIN RIGHT CHEST WALL;  Surgeon: Autumn Messing III, MD;  Location: [facility];  Service: General;  Laterality: Right;  EXCISION OF SKIN RIGHT CHEST WALL  . MASTECTOMY Right 2017  . MASTECTOMY W/ SENTINEL NODE BIOPSY Right 10/01/2015   Procedure: RIGHT MASTECTOMY WITH SENTINEL LYMPH NODE BIOPSY;  Surgeon: Autumn Messing III, MD;  Location: [facility];  Service: General;  Laterality: Right;  . PORT-A-CATH REMOVAL Left 04/30/2016   Procedure: REMOVAL PORT-A-CATH;  Surgeon: Autumn Messing III, MD;  Location: [facility];  Service: General;  Laterality: Left;  REMOVAL PORT-A-CATH  . PORTACATH PLACEMENT Left 11/01/2015   Procedure: INSERTION PORT-A-CATH;  Surgeon: Autumn Messing III, MD;  Location: [facility];  Service: General;  Laterality: Left;  . REPLACEMENT UNICONDYLAR JOINT KNEE Left 04/20/2001  . REVISION TOTAL KNEE ARTHROPLASTY Right 06/18/2004  . REVISION  TOTAL KNEE ARTHROPLASTY Left 11/15/2002  . REVISION TOTAL KNEE ARTHROPLASTY Left 10/12/2001  . REVISION TOTAL KNEE ARTHROPLASTY Right 07/09/2015  . TONSILLECTOMY    . TOTAL KNEE ARTHROPLASTY Right 04/25/2003  . TOTAL KNEE ARTHROPLASTY Left 06/25/2009    FAMILY HISTORY Family History  Problem Relation Age of Onset  . Stomach cancer Mother   . Heart disease Father   . Prostate cancer Father   . Kidney cancer Sister        one out of the four  . Hypertension Sister   . Hypertension Brother   . Crohn's disease Other        nephew  . Colon cancer Maternal Grandmother     . Esophageal cancer Neg Hx   [name] is a real long radiation since she was coming to see me today if she wanted to she could drop by downstairs would see her and she would like to review them does let them know that she is originally The patient's father died at the age of 59 with congestive heart failure. Her mother is still living as of November 2016, age 104. The patient had 6 brothers, 4 sisters. One sister was diagnosed with kidney cancer at age 15. ([place]). She is doing well. The patient's maternal grandmother was diagnosed with colon cancer at the age of 16. The patient's mother was diagnosed with a "rare stomach cancer" 20 years ago. The patient's father was diagnosed with prostate cancer at age 72. There is no history of breast or ovarian cancer in the family to her knowledge.   GYNECOLOGIC HISTORY:  No LMP recorded. Patient has had a hysterectomy.  menarche age 25, first live birth age 89, the patient is GX P3. She underwent remote hysterectomy with bilateral salpingo-oophorectomy. She did not use hormone replacement. She never used oral contraceptives.   SOCIAL HISTORY:  [place] used to work at [employer] but she is now disabled because of her multiple arthritic and degenerative problems. Her husband [name] works as a Freight forwarder. Son [name] works in Teacher, adult education care in [place]. Son [name] works for a Arboriculturist in [place]. Daughter  [name] is a Haematologist in [place]. The patient has 5 grandchildren. She attends a Physicist, medical church    ADVANCED DIRECTIVES: Not in place   HEALTH MAINTENANCE: Social History   Tobacco Use  . Smoking status: Current Every Day Smoker    Packs/day: 0.25    Years: 20.00    Pack years: 5.00    Types: Cigarettes  . Smokeless tobacco: Former Systems developer    Quit date: 09/30/2015  . Tobacco comment: smokes about 4 cigs/day; working on quitting - 08/22/16 gwd  Substance Use Topics  . Alcohol  use: No    Alcohol/week: 0.0 standard drinks  . Drug use: No     Colonoscopy:February 2016   QQV:ZDGLOV post hysterectomy   Bone density:  Lipid panel:  Allergies  Allergen Reactions  . Codeine Itching, Nausea And Vomiting and Other (See Comments)    Itching all over the body  . Latex Hives  . Sulfonamide Derivatives Hives and Other (See Comments)    All over the body  . Gabapentin Other (See Comments)    headache  . Lyrica [Pregabalin] Other (See Comments)    headache    Current Outpatient Medications  Medication Sig Dispense Refill  . amitriptyline (ELAVIL) 25 MG tablet Take 50 mg by mouth at bedtime.     Marland Kitchen amLODipine (NORVASC) 10  MG tablet Take 1 tablet (10 mg total) by mouth daily. 30 tablet 3  . KLOR-CON M20 20 MEQ tablet Take 20 mEq by mouth daily.     Marland Kitchen lisinopril (PRINIVIL,ZESTRIL) 10 MG tablet Take 2 tablets (20 mg total) by mouth once for 1 dose. (Patient taking differently: Take 10 mg by mouth daily. ) 180 tablet 3  . metFORMIN (GLUCOPHAGE) 500 MG tablet TAKE 1 TABLET BY MOUTH TWICE A DAY WITH A MEAL (Patient taking differently: Take 500 mg by mouth daily with breakfast. ) 60 tablet 8  . metoprolol tartrate (LOPRESSOR) 50 MG tablet TAKE 1 TABLET BY MOUTH TWICE A DAY (Patient taking differently: Take 50 mg by mouth daily. ) 60 tablet 5  . omeprazole (PRILOSEC) 40 MG capsule TAKE 1 CAPSULE BY MOUTH EVERY DAY (Patient taking differently: Take 40 mg by mouth daily. ) 30 capsule 5  . ONE TOUCH ULTRA TEST test strip TEST ONCE DAILY 25 each 3  . ONETOUCH DELICA LANCETS FINE MISC USE TO TEST ONCE DAILY DX CODE E11.9 100 each 0  . pregabalin (LYRICA) 75 MG capsule Take 1 capsule (75 mg total) by mouth 2 (two) times daily. 60 capsule 0   No current facility-administered medications for this visit.     OBJECTIVE: Middle-aged African-American woman wearing a chest brace  Vitals:   08/02/18 1500  BP: (!) 174/82  Pulse: 75  Resp: 18  Temp: 98.3 F (36.8 C)  SpO2: 99%      Body mass index is 35.32 kg/m.    ECOG FS:2 - Symptomatic, <50% confined to bed   Sclerae unicteric, pupils round and equal No cervical or supraclavicular adenopathy Lungs no rales or rhonchi Heart regular rate and rhythm Abd soft, obese, nontender, positive bowel sounds MSK wearing a body brace as noted Neuro: nonfocal, well oriented, appropriate affect Breasts: The left axilla shows no suspicious masses by palpation   LAB RESULTS:  CMP     Component Value Date/Time   NA 137 07/16/2018 1431   NA 140 07/30/2017 1132   K 3.7 07/16/2018 1431   K 3.3 (L) 07/30/2017 1132   CL 105 07/16/2018 1431   CO2 25 07/16/2018 1431   CO2 23 07/30/2017 1132   GLUCOSE 109 (H) 07/16/2018 1431   GLUCOSE 116 07/30/2017 1132   BUN 11 07/16/2018 1431   BUN 15.6 07/30/2017 1132   CREATININE 1.03 (H) 07/16/2018 1431   CREATININE 0.9 07/30/2017 1132   CALCIUM 8.6 (L) 07/16/2018 1431   CALCIUM 9.6 07/30/2017 1132   PROT 7.0 05/07/2018 1140   PROT 7.4 07/30/2017 1132   ALBUMIN 4.1 05/07/2018 1140   ALBUMIN 4.1 07/30/2017 1132   AST 20 05/07/2018 1140   AST 17 07/30/2017 1132   ALT 16 05/07/2018 1140   ALT 13 07/30/2017 1132   ALKPHOS 71 05/07/2018 1140   ALKPHOS 76 07/30/2017 1132   BILITOT 0.6 05/07/2018 1140   BILITOT 0.25 07/30/2017 1132   GFRNONAA 59 (L) 07/16/2018 1431   GFRAA >60 07/16/2018 1431    INo results found for: SPEP, UPEP  Lab Results  Component Value Date   WBC 8.3 08/02/2018   NEUTROABS 4.7 08/02/2018   HGB 10.8 (L) 08/02/2018   HCT 33.6 (L) 08/02/2018   MCV 90.6 08/02/2018   PLT 417 (H) 08/02/2018      Chemistry      Component Value Date/Time   NA 137 07/16/2018 1431   NA 140 07/30/2017 1132   K 3.7 07/16/2018 1431  K 3.3 (L) 07/30/2017 1132   CL 105 07/16/2018 1431   CO2 25 07/16/2018 1431   CO2 23 07/30/2017 1132   BUN 11 07/16/2018 1431   BUN 15.6 07/30/2017 1132   CREATININE 1.03 (H) 07/16/2018 1431   CREATININE 0.9 07/30/2017 1132        Component Value Date/Time   CALCIUM 8.6 (L) 07/16/2018 1431   CALCIUM 9.6 07/30/2017 1132   ALKPHOS 71 05/07/2018 1140   ALKPHOS 76 07/30/2017 1132   AST 20 05/07/2018 1140   AST 17 07/30/2017 1132   ALT 16 05/07/2018 1140   ALT 13 07/30/2017 1132   BILITOT 0.6 05/07/2018 1140   BILITOT 0.25 07/30/2017 1132       No results found for: LABCA2  No components found for: LABCA125  No results for input(s): INR in the last 168 hours.  Urinalysis    Component Value Date/Time   COLORURINE YELLOW 05/07/2018 1628   APPEARANCEUR CLEAR 05/07/2018 1628   LABSPEC 1.023 05/07/2018 1628   LABSPEC 1.010 02/19/2016 1355   PHURINE 5.0 05/07/2018 1628   GLUCOSEU NEGATIVE 05/07/2018 1628   GLUCOSEU Negative 02/19/2016 1355   HGBUR SMALL (A) 05/07/2018 1628   HGBUR large 07/01/2010 0945   BILIRUBINUR NEGATIVE 05/07/2018 1628   BILIRUBINUR neg 10/14/2017 1010   BILIRUBINUR Negative 02/19/2016 1355   KETONESUR NEGATIVE 05/07/2018 1628   PROTEINUR NEGATIVE 05/07/2018 1628   UROBILINOGEN 0.2 10/14/2017 1010   UROBILINOGEN 0.2 02/19/2016 1355   NITRITE NEGATIVE 05/07/2018 1628   LEUKOCYTESUR NEGATIVE 05/07/2018 1628   LEUKOCYTESUR Small 02/19/2016 1355    STUDIES: Unilateral left mammography and axillary ultrasonography April 2019  ASSESSMENT: 57 y.o. [name] woman status post right breast lower inner quadrant biopsy 3 and axillary lymph node biopsy 08/02/2015 for a clinical mpT1c N0, stage IA invasive ductal carcinoma, grade 1 or 2, estrogen receptor 95% positive, progesterone receptor 5% positive, with an MIB-1 between 5 and 10%, and HER-2 equivocal on 1 of the 2 biopsies (signals ratio 1.30, number per cell 4.0).  (1) status post right modified radical mastectomy 10/01/2015 for an mpT2 pN0, stage IIA invasive ductal carcinoma, grade 2, with a total of 20 benign lymph nodes removed  (2) Oncotype DX score of 32 predicts an outside the breast recurrence risk of 22% within 10 years if  the patient's only systemic therapy is tamoxifen for 5 years. It also predicts significant benefit from adjuvant chemotherapy.  (3) patient completed adjuvant doxorubicin and cyclophosphamide in dose dense fashion 4, last dose 12/24/2015, followed by paclitaxel weekly starting 01/07/2016  (a) completed 10 of 12 planned paclitaxel doses, final 2 doses omitted because of neuropathy concerns    (4) postmastectomy radiation 06/02/16-07/17/16 1.  The right chest wall and supraclavicular region was treated to 50.4 Gy in 28 fractions of 1.8 Gy 2.  The mastectomy site was boosted to 60.4 Gy with 5 fractions of 2 Gy  (5) started anastrozole 08/22/2016  (a) bone density 10/02/2016 shows a T score of -0.4, in the normal range.  (b) anastrozole discontinued November 2019 secondary to side effects  (6) to start tamoxifen 09/22/2018  (a) status post remote TAH-BSO.    PLAN: Ernestyne is now nearly 3 years out from definitive surgery for breast cancer with no evidence of disease recurrence.  This is very favorable.  She is tolerating anastrozole only moderately well.  It is possible she might do considerably better with tamoxifen.  Since she has had over 2 years of anastrozole, switching to tamoxifen  at this point will not affect her risk reduction.  According I have asked her to stop anastrozole and then start tamoxifen after break of several weeks.  Specifically she will start tamoxifen 09/22/2018.  She will then see me in February to make sure she is tolerating it well.  We did discuss the possible toxicity side effects and complications of tamoxifen including the rare case of blood clots.  Recall she took oral contraceptives remotely with no clotting complications.  Also notes she is status post hysterectomy  She knows to call for any other issues that may develop before her next visit here. Jozsef Wescoat, Virgie Dad, MD  08/02/18 3:30 PM Medical Oncology and Hematology [facility] [address] [place], [place] [address] Tel. [phone]    Fax. [phone]    Elie Goody, am acting as scribe for Dr. Virgie Dad. Haeven Nickle.  I, Lurline Del MD, have reviewed the above documentation for accuracy and completeness, and I agree with the above.

## 2018-08-02 NOTE — Telephone Encounter (Signed)
Printed calendar and avs. °

## 2018-08-04 DIAGNOSIS — M4807 Spinal stenosis, lumbosacral region: Secondary | ICD-10-CM | POA: Diagnosis not present

## 2018-08-04 DIAGNOSIS — Z4789 Encounter for other orthopedic aftercare: Secondary | ICD-10-CM | POA: Diagnosis not present

## 2018-08-04 DIAGNOSIS — M5136 Other intervertebral disc degeneration, lumbar region: Secondary | ICD-10-CM | POA: Diagnosis not present

## 2018-08-13 DIAGNOSIS — Z4789 Encounter for other orthopedic aftercare: Secondary | ICD-10-CM | POA: Diagnosis not present

## 2018-08-13 DIAGNOSIS — M4807 Spinal stenosis, lumbosacral region: Secondary | ICD-10-CM | POA: Diagnosis not present

## 2018-08-13 DIAGNOSIS — M5136 Other intervertebral disc degeneration, lumbar region: Secondary | ICD-10-CM | POA: Diagnosis not present

## 2018-08-26 ENCOUNTER — Encounter (INDEPENDENT_AMBULATORY_CARE_PROVIDER_SITE_OTHER): Payer: Self-pay | Admitting: Specialist

## 2018-08-26 ENCOUNTER — Ambulatory Visit (INDEPENDENT_AMBULATORY_CARE_PROVIDER_SITE_OTHER): Payer: Self-pay

## 2018-08-26 ENCOUNTER — Ambulatory Visit (INDEPENDENT_AMBULATORY_CARE_PROVIDER_SITE_OTHER): Payer: 59 | Admitting: Specialist

## 2018-08-26 VITALS — BP 130/74 | HR 76 | Ht 67.0 in | Wt 253.5 lb

## 2018-08-26 DIAGNOSIS — Z981 Arthrodesis status: Secondary | ICD-10-CM

## 2018-08-26 NOTE — Progress Notes (Signed)
Post-Op Visit Note   Patient: [name]           Date of Birth: Apr 07, 1961           MRN: [id number] Visit Date: 08/26/2018 PCP: Laurey Morale, MD   Assessment & Plan:  Chief Complaint:  Chief Complaint  Patient presents with  . Lower Back - Routine Post Op   Visit Diagnoses:  1. S/P lumbar fusion   Some numbness and tingling right side into the right knee.  Mild swelling right thigh. Motor is normal  Plan: Avoid frequent bending and stooping  No lifting greater than 10 lbs. May use ice or moist heat for pain. Weight loss is of benefit.  Exercise is important to improve your indurance and does allow people to function better inspite of back pain.    Follow-Up Instructions: No follow-ups on file.   Orders:  Orders Placed This Encounter  Procedures  . XR Lumbar Spine 2-3 Views   No orders of the defined types were placed in this encounter.   Imaging: No results found.  PMFS History: Patient Active Problem List   Diagnosis Date Noted  . DDD (degenerative disc disease), lumbar 07/16/2018    Priority: High    Class: Chronic  . Other spondylosis with radiculopathy, lumbar region 07/16/2018    Priority: High    Class: Chronic  . S/P lumbar fusion 07/13/2018  . Tenosynovitis, de Quervain 08/10/2017  . 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 Insomnia 07/16/2016  . Left groin pain 11/30/2015  . Severe obesity (BMI >= 40) ([place]) 10/26/2015  . Malignant neoplasm of lower-inner quadrant of right breast of female, estrogen receptor positive ([place]) 08/08/2015  . S/P revision of total knee 07/24/2015  . Acute postoperative pain 07/11/2015  . Loose total knee arthroplasty ([place]) 07/02/2015  . Type 2 diabetes mellitus without complications ([place]) 32/35/5732  . Adhesive capsulitis of left shoulder 10/11/2014  . Paraspinous mass, left 03/31/2013  . Other symptoms and signs involving the nervous system 03/31/2013  . Internal hemorrhoid 02/17/2013  . Abdominal pain, other  specified site 02/17/2013  . Neoplasm of soft tissue, left shoulder and left upper arm 08/03/2012  . Lightheadedness 01/29/2012  . Cholelithiasis with cholecystitis 10/08/2011  . Hypokalemia 09/17/2011  . Gastro-esophageal reflux disease without esophagitis 08/08/2010  . Gout 12/18/2008  . Essential (primary) hypertension 03/15/2007  . OA (osteoarthritis) of knee 03/15/2007  . HEADACHE 03/15/2007   Past Medical History:  Diagnosis Date  . Breast cancer ([place]) 2017   right breast  . Breast cancer of lower-inner quadrant of right female breast ([place]) 08/08/2015  . Colon polyps   . DDD (degenerative disc disease), cervical   . Degenerative joint disease   . Diabetes mellitus without complication ([place])   . Diverticulosis   . Gallstones   . GERD (gastroesophageal reflux disease)   . Headache(784.0)    migraine like per patient  . Hypertension   . Low back pain    dr phillips, pain management  . Osteoarthritis   . Personal history of chemotherapy 2017  . Personal history of radiation therapy 2017    Family History  Problem Relation Age of Onset  . Stomach cancer Mother   . Heart disease Father   . Prostate cancer Father   . Kidney cancer Sister        one out of the four  . Hypertension Sister   . Hypertension Brother   . Crohn's disease Other  nephew  . Colon cancer Maternal Grandmother   . Esophageal cancer Neg Hx     Past Surgical History:  Procedure Laterality Date  . CHOLECYSTECTOMY  10/28/2011   Procedure: LAPAROSCOPIC CHOLECYSTECTOMY WITH INTRAOPERATIVE CHOLANGIOGRAM;  Surgeon: Earnstine Regal, MD;  [facility];  Service: General;  Laterality: N/A;  . COLONOSCOPY WITH ESOPHAGOGASTRODUODENOSCOPY (EGD)  02/03/2013   with Propofol  . EXAM UNDER ANESTHESIA WITH MANIPULATION OF KNEE Right 05/30/2003  . EXAM UNDER ANESTHESIA WITH MANIPULATION OF KNEE Left 12/27/2002  . GANGLION CYST EXCISION Right    right wrist  . KNEE ARTHROSCOPY Bilateral 01/02/2000  . KNEE  ARTHROSCOPY Right   . LAPAROSCOPIC VAGINAL HYSTERECTOMY WITH SALPINGO OOPHORECTOMY Bilateral 07/13/2006  . LIPOMA EXCISION Right 02/21/2008   hip  . MASS EXCISION  08/24/2012   Procedure: EXCISION MASS;  Surgeon: Earnstine Regal, MD;  Location: [facility];  Service: General;  Laterality: Left;  excisie soft tissue masses left shoulder(back) & left upper arm  . MASS EXCISION Left 01/24/2014   Procedure: EXCISION SOFT TISSUE MASSES LOWER LEFT BACK;  Surgeon: Earnstine Regal, MD;  Location: [facility];  Service: General;  Laterality: Left;  Marland Kitchen MASS EXCISION Right 04/30/2016   Procedure: EXCISION OF SKIN RIGHT CHEST WALL;  Surgeon: Autumn Messing III, MD;  Location: [facility];  Service: General;  Laterality: Right;  EXCISION OF SKIN RIGHT CHEST WALL  . MASTECTOMY Right 2017  . MASTECTOMY W/ SENTINEL NODE BIOPSY Right 10/01/2015   Procedure: RIGHT MASTECTOMY WITH SENTINEL LYMPH NODE BIOPSY;  Surgeon: Autumn Messing III, MD;  Location: [facility];  Service: General;  Laterality: Right;  . PORT-A-CATH REMOVAL Left 04/30/2016   Procedure: REMOVAL PORT-A-CATH;  Surgeon: Autumn Messing III, MD;  Location: [facility];  Service: General;  Laterality: Left;  REMOVAL PORT-A-CATH  . PORTACATH PLACEMENT Left 11/01/2015   Procedure: INSERTION PORT-A-CATH;  Surgeon: Autumn Messing III, MD;  Location: [facility];  Service: General;  Laterality: Left;  . REPLACEMENT UNICONDYLAR JOINT KNEE Left 04/20/2001  . REVISION TOTAL KNEE ARTHROPLASTY Right 06/18/2004  . REVISION TOTAL KNEE ARTHROPLASTY Left 11/15/2002  . REVISION TOTAL KNEE ARTHROPLASTY Left 10/12/2001  . REVISION TOTAL KNEE ARTHROPLASTY Right 07/09/2015  . TONSILLECTOMY    . TOTAL KNEE ARTHROPLASTY Right 04/25/2003  . TOTAL KNEE ARTHROPLASTY Left 06/25/2009   Social History   Occupational History  . Occupation: retired/disabled  Tobacco Use  . Smoking status: Current Every Day Smoker    Packs/day: 0.25    Years:  20.00    Pack years: 5.00    Types: Cigarettes  . Smokeless tobacco: Former Systems developer    Quit date: 09/30/2015  . Tobacco comment: smokes about 4 cigs/day; working on quitting - 08/22/16 gwd  Substance and Sexual Activity  . Alcohol use: No    Alcohol/week: 0.0 standard drinks  . Drug use: No  . Sexual activity: Yes    Birth control/protection: Post-menopausal

## 2018-08-26 NOTE — Patient Instructions (Signed)
Avoid frequent bending and stooping  No lifting greater than 10 lbs. May use ice or moist heat for pain. Weight loss is of benefit.  Exercise is important to improve your indurance and does allow people to function better inspite of back pain. 

## 2018-08-30 ENCOUNTER — Other Ambulatory Visit: Payer: Self-pay | Admitting: Family Medicine

## 2018-09-03 ENCOUNTER — Other Ambulatory Visit: Payer: Self-pay | Admitting: *Deleted

## 2018-09-03 ENCOUNTER — Telehealth: Payer: Self-pay | Admitting: *Deleted

## 2018-09-03 DIAGNOSIS — C50311 Malignant neoplasm of lower-inner quadrant of right female breast: Secondary | ICD-10-CM

## 2018-09-03 DIAGNOSIS — Z170 Estrogen receptor positive status [ER+]: Principal | ICD-10-CM

## 2018-09-03 NOTE — Telephone Encounter (Signed)
This RN spoke with pt per her call stating need for referral for PT due to issues of ROM in right shoulder.  [name] denies any redness or swelling.  Referral placed for PT at the Lymphedema clinic.

## 2018-09-07 ENCOUNTER — Other Ambulatory Visit: Payer: Self-pay

## 2018-09-07 ENCOUNTER — Encounter: Payer: Self-pay | Admitting: Physical Therapy

## 2018-09-07 ENCOUNTER — Ambulatory Visit: Payer: 59 | Attending: Oncology | Admitting: Physical Therapy

## 2018-09-07 DIAGNOSIS — R293 Abnormal posture: Secondary | ICD-10-CM | POA: Insufficient documentation

## 2018-09-07 DIAGNOSIS — M6281 Muscle weakness (generalized): Secondary | ICD-10-CM | POA: Diagnosis not present

## 2018-09-07 DIAGNOSIS — L599 Disorder of the skin and subcutaneous tissue related to radiation, unspecified: Secondary | ICD-10-CM | POA: Insufficient documentation

## 2018-09-07 DIAGNOSIS — M25611 Stiffness of right shoulder, not elsewhere classified: Secondary | ICD-10-CM | POA: Diagnosis not present

## 2018-09-07 DIAGNOSIS — I972 Postmastectomy lymphedema syndrome: Secondary | ICD-10-CM

## 2018-09-07 NOTE — Patient Instructions (Signed)
Shoulder: Flexion (Supine)    With hands shoulder width apart, slowly lower dowel to floor behind head. Do not let elbows bend. Keep back flat. Hold _10-15___ seconds. Repeat _5-10___ times. Do _2___ sessions per day. CAUTION: Stretch slowly and gently.  Copyright  VHI. All rights reserved.  Shoulder: Abduction (Supine)    With right arm flat on floor, hold dowel in palm. Slowly move arm up to side of head by pushing with opposite arm. Do not let elbow bend. Hold _10-15___ seconds. Repeat _5-10___ times. Do ___2_ sessions per day. CAUTION: Stretch slowly and gently.  Copyright  VHI. All rights reserved.

## 2018-09-07 NOTE — Addendum Note (Signed)
Addended by: Manus Gunning L on: 09/07/2018 12:39 PM   Modules accepted: Orders

## 2018-09-07 NOTE — Therapy (Signed)
[place], [place], [address] Phone: [phone]   Fax:  [phone]  Physical Therapy Evaluation  Patient Details  Name: [name] MRN: [id number] Date of Birth: [birth year] Referring Provider (PT): [name]   Encounter Date: 09/07/2018  PT End of Session - 09/07/18 1218    Visit Number  1    Number of Visits  9    Date for PT Re-Evaluation  10/05/18    PT Start Time  0809    PT Stop Time  0840    PT Time Calculation (min)  31 min    Activity Tolerance  Patient tolerated treatment well    Behavior During Therapy  [facility] for tasks assessed/performed       Past Medical History:  Diagnosis Date  . Breast cancer ([place]) 2017   right breast  . Breast cancer of lower-inner quadrant of right female breast ([place]) 08/08/2015  . Colon polyps   . DDD (degenerative disc disease), cervical   . Degenerative joint disease   . Diabetes mellitus without complication ([place])   . Diverticulosis   . Gallstones   . GERD (gastroesophageal reflux disease)   . Headache(784.0)    migraine like per patient  . Hypertension   . Low back pain    dr phillips, pain management  . Osteoarthritis   . Personal history of chemotherapy 2017  . Personal history of radiation therapy 2017    Past Surgical History:  Procedure Laterality Date  . CHOLECYSTECTOMY  10/28/2011   Procedure: LAPAROSCOPIC CHOLECYSTECTOMY WITH INTRAOPERATIVE CHOLANGIOGRAM;  Surgeon: Earnstine Regal, MD;  Location: [facility];  Service: General;  Laterality: N/A;  . COLONOSCOPY WITH ESOPHAGOGASTRODUODENOSCOPY (EGD)  02/03/2013   with Propofol  . EXAM UNDER ANESTHESIA WITH MANIPULATION OF KNEE Right 05/30/2003  . EXAM UNDER ANESTHESIA WITH MANIPULATION OF KNEE Left 12/27/2002  . GANGLION CYST EXCISION Right    right wrist  . KNEE ARTHROSCOPY Bilateral 01/02/2000  . KNEE ARTHROSCOPY Right   . LAPAROSCOPIC VAGINAL HYSTERECTOMY WITH SALPINGO OOPHORECTOMY  Bilateral 07/13/2006  . LIPOMA EXCISION Right 02/21/2008   hip  . MASS EXCISION  08/24/2012   Procedure: EXCISION MASS;  Surgeon: Earnstine Regal, MD;  Location: [place];  Service: General;  Laterality: Left;  excisie soft tissue masses left shoulder(back) & left upper arm  . MASS EXCISION Left 01/24/2014   Procedure: EXCISION SOFT TISSUE MASSES LOWER LEFT BACK;  Surgeon: Earnstine Regal, MD;  Location: [place];  Service: General;  Laterality: Left;  Marland Kitchen MASS EXCISION Right 04/30/2016   Procedure: EXCISION OF SKIN RIGHT CHEST WALL;  Surgeon: Autumn Messing III, MD;  Location: [place];  Service: General;  Laterality: Right;  EXCISION OF SKIN RIGHT CHEST WALL  . MASTECTOMY Right 2017  . MASTECTOMY W/ SENTINEL NODE BIOPSY Right 10/01/2015   Procedure: RIGHT MASTECTOMY WITH SENTINEL LYMPH NODE BIOPSY;  Surgeon: Autumn Messing III, MD;  Location: [place];  Service: General;  Laterality: Right;  . PORT-A-CATH REMOVAL Left 04/30/2016   Procedure: REMOVAL PORT-A-CATH;  Surgeon: Autumn Messing III, MD;  Location: [place];  Service: General;  Laterality: Left;  REMOVAL PORT-A-CATH  . PORTACATH PLACEMENT Left 11/01/2015   Procedure: INSERTION PORT-A-CATH;  Surgeon: Autumn Messing III, MD;  Location: [place];  Service: General;  Laterality: Left;  . REPLACEMENT UNICONDYLAR JOINT KNEE Left 04/20/2001  . REVISION TOTAL KNEE ARTHROPLASTY Right 06/18/2004  . REVISION TOTAL KNEE  ARTHROPLASTY Left 11/15/2002  . REVISION TOTAL KNEE ARTHROPLASTY Left 10/12/2001  . REVISION TOTAL KNEE ARTHROPLASTY Right 07/09/2015  . TONSILLECTOMY    . TOTAL KNEE ARTHROPLASTY Right 04/25/2003  . TOTAL KNEE ARTHROPLASTY Left 06/25/2009    There were no vitals filed for this visit.   Subjective Assessment - 09/07/18 0811    Subjective  I am having trouble lifting my right arm and my hand is swelling and I can not get my rings off. I am having swelling in my right armpit.      Pertinent History  breast cancer with right  mastectomy with 20 lymph nodes removed in january 2017, chemo with peripheral neuropathy  and  had to have more skin removed before radiation radiation with skin disruption which was concluded last of October.  Past history includes multiple levels of degenerative disc disease in neck and spine and 4 knee surgeries on each knee with the last one in October 2016. Pt had back surgery in October 2019 - rods placed pt has to wear back brace - no bending, no lifting over 5 lbs    Patient Stated Goals  get arm back to where she can reach higher    Currently in Pain?  No/denies    Pain Score  0-No pain         [facility] PT Assessment - 09/07/18 0001      Assessment   Medical Diagnosis  right breast cancer    Referring Provider (PT)  [name]    Onset Date/Surgical Date  08/02/15    Hand Dominance  Right    Prior Therapy  Was being seen a year ago for right shoulder ROM and R trunk lymphedema      Precautions   Precautions  Back    Precaution Comments  no lifting over 5 lbs, no bending      Restrictions   Weight Bearing Restrictions  Yes    Other Position/Activity Restrictions  no lifting over 5 lbs      Balance Screen   Has the patient fallen in the past 6 months  No    Has the patient had a decrease in activity level because of a fear of falling?   No    Is the patient reluctant to leave their home because of a fear of falling?   No      Home Film/video editor residence    Living Arrangements  Spouse/significant other;Other relatives    Available Help at Discharge  Family    Type of [place] to enter    Entrance Stairs-Number of Steps  2      Prior Function   Level of Independence  Needs assistance with ADLs   due to back precautions   Vocation  On disability    Leisure  pt states she does a lot of walking but she is limited secondary to recent back surgery      Cognition   Overall  Cognitive Status  Within Functional Limits for tasks assessed      AROM   Right Shoulder Flexion  107 Degrees    Right Shoulder ABduction  95 Degrees    Right Shoulder Internal Rotation  64 Degrees    Right Shoulder External Rotation  56 Degrees    Left Shoulder Flexion  147 Degrees    Left Shoulder ABduction  144 Degrees    Left Shoulder Internal Rotation  48 Degrees    Left Shoulder External Rotation  84 Degrees        LYMPHEDEMA/ONCOLOGY QUESTIONNAIRE - 09/07/18 0823      Type   Cancer Type  Right breast cancer      Surgeries   Mastectomy Date  08/02/15    Axillary Lymph Node Dissection Date  08/02/15    Number Lymph Nodes Removed  21      Date Lymphedema/Swelling Started   Date  03/11/17      Treatment   Active Chemotherapy Treatment  No    Past Chemotherapy Treatment  Yes    Active Radiation Treatment  No    Past Radiation Treatment  Yes    Current Hormone Treatment  Yes    Drug Name  Tamoxifen   off for 1 month and will begin again Sep 22 2018     What other symptoms do you have   Are you Having Heaviness or Tightness  Yes    Are you having Pain  Yes    Are you having pitting edema  No    Is it Hard or Difficult finding clothes that fit  No    Do you have infections  No    Is there Decreased scar mobility  Yes      Lymphedema Assessments   Lymphedema Assessments  Upper extremities      Right Upper Extremity Lymphedema   15 cm Proximal to Olecranon Process  38.3 cm    10 cm Proximal to Olecranon Process  --    Olecranon Process  33.6 cm    15 cm Proximal to Ulnar Styloid Process  31 cm    10 cm Proximal to Ulnar Styloid Process  --    Just Proximal to Ulnar Styloid Process  21.2 cm    Across Hand at [employer]  23.6 cm    At [place] of 2nd Digit  8.1 cm      Left Upper Extremity Lymphedema   15 cm Proximal to Olecranon Process  42 cm    10 cm Proximal to Olecranon Process  --    Olecranon Process  33.6 cm    15 cm Proximal to Ulnar Styloid Process   30.6 cm    10 cm Proximal to Ulnar Styloid Process  --    Just Proximal to Ulnar Styloid Process  21.5 cm    Across Hand at [employer]  24.5 cm    At [place] of 2nd Digit  8 cm             Objective measurements completed on examination: See above findings.              PT Education - 09/07/18 1221    Education provided  Yes    Education Details  post op breast exercises, supine cane exercises    Person(s) Educated  Patient    Methods  Explanation;Handout;Demonstration    Comprehension  Verbalized understanding;Returned demonstration          PT Long Term Goals - 09/07/18 1223      PT LONG TERM GOAL #1   Title  Pt will demonstrate 145 degrees of right shoulder flexion to allow her to reach up in to cabinets    Baseline  107    Time  4    Period  Weeks    Status  New    Target Date  10/05/18      PT LONG TERM GOAL #2  Title  Pt will demonstrate 145 degrees of right shoulder abduction to allow pt to reach out to side    Baseline  95    Time  4    Period  Weeks    Status  New    Target Date  10/05/18      PT LONG TERM GOAL #3   Title  Pt will report a 50% decrease in tightness in right axilla with right shoulder movement    Time  4    Period  Weeks    Status  New    Target Date  10/05/18      PT LONG TERM GOAL #4   Title  Pt to be independent in a home exercise program for continued strengthening and stretching    Time  4    Period  Weeks    Status  New    Target Date  10/05/18      PT LONG TERM GOAL #5   Title  Pt to report a 50% improvement in right lateral trunk lymphedema to allow improved comfort    Time  4    Period  Weeks    Status  New    Target Date  10/05/18             Plan - 09/07/18 0840    Clinical Impression Statement  Pt presents to PT with limited R shoulder motion and R lateral trunk and hand swelling. She was seen in this clinic a year ago for decreased right shoulder ROM and lymphedema following treatment for  right breast cancer. She is extremely limited with her right shoulder ROM now and states it may be due to her recent back surgery since she has been less active. She wears a back brace now and has to continue doing so for three months. She is unable to bend forward or lift more than 5 lbs. Pt would benefit from skilled PT services to increase right shoulder ROM so pt can reach in to her cabinets and hook her bra independently and decrease right lateral trunk lymphedema as well as assist pt without obtaining appropriate compression garments.    History and Personal Factors relevant to plan of care:  right handed, arthritis    Clinical Presentation  Stable    Clinical Decision Making  Low    Rehab Potential  Good    Clinical Impairments Affecting Rehab Potential  20 lymph nodes removed, previous radiation     PT Frequency  2x / week    PT Duration  4 weeks    PT Treatment/Interventions  ADLs/Self Care Home Management;Patient/family education;Manual techniques;Scar mobilization;Passive range of motion;Therapeutic activities;Therapeutic exercise;Manual lymph drainage;Taping;Vasopneumatic Device;Compression bandaging;Joint Manipulations;Iontophoresis 4mg /ml Dexamethasone    PT Next Visit Plan  begin gentle PROM to right shoulder, ensure pt log rolls to get up and down from mat table, pulleys, ball    PT Home Exercise Plan  cane exercises in supine, post op breast exercises    Recommended Other Services  sent demographics to [facility] for new sleeve and glove    Consulted and Agree with Plan of Care  Patient       Patient will benefit from skilled therapeutic intervention in order to improve the following deficits and impairments:  Decreased knowledge of precautions, Decreased scar mobility, Decreased strength, Pain, Impaired UE functional use, Increased fascial restricitons, Postural dysfunction, Decreased range of motion, Increased edema  Visit Diagnosis: Stiffness of right shoulder, not  elsewhere classified  Postmastectomy lymphedema  Muscle weakness (generalized)  Abnormal posture  Disorder of the skin and subcutaneous tissue related to radiation, unspecified     Problem List Patient Active Problem List   Diagnosis Date Noted  . DDD (degenerative disc disease), lumbar 07/16/2018    Class: Chronic  . Other spondylosis with radiculopathy, lumbar region 07/16/2018    Class: Chronic  . S/P lumbar fusion 07/13/2018  . Tenosynovitis, de Quervain 08/10/2017  . 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 Insomnia 07/16/2016  . Left groin pain 11/30/2015  . Severe obesity (BMI >= 40) ([place]) 10/26/2015  . Malignant neoplasm of lower-inner quadrant of right breast of female, estrogen receptor positive ([place]) 08/08/2015  . S/P revision of total knee 07/24/2015  . Acute postoperative pain 07/11/2015  . Loose total knee arthroplasty ([place]) 07/02/2015  . Type 2 diabetes mellitus without complications ([place]) 02/77/4128  . Adhesive capsulitis of left shoulder 10/11/2014  . Paraspinous mass, left 03/31/2013  . Other symptoms and signs involving the nervous system 03/31/2013  . Internal hemorrhoid 02/17/2013  . Abdominal pain, other specified site 02/17/2013  . Neoplasm of soft tissue, left shoulder and left upper arm 08/03/2012  . Lightheadedness 01/29/2012  . Cholelithiasis with cholecystitis 10/08/2011  . Hypokalemia 09/17/2011  . Gastro-esophageal reflux disease without esophagitis 08/08/2010  . Gout 12/18/2008  . Essential (primary) hypertension 03/15/2007  . OA (osteoarthritis) of knee 03/15/2007  . HEADACHE 03/15/2007    Allyson Sabal [facility] 09/07/2018, 12:25 PM  [place], [place], [address] Phone: [phone]   Fax:  [phone]  Name: [name] MRN: [id number] Date of Birth: 13-Dec-1960  Manus Gunning, PT 09/07/18 12:26 PM

## 2018-09-13 ENCOUNTER — Ambulatory Visit: Payer: 59 | Admitting: Physical Therapy

## 2018-09-13 ENCOUNTER — Encounter: Payer: Self-pay | Admitting: Physical Therapy

## 2018-09-13 DIAGNOSIS — L599 Disorder of the skin and subcutaneous tissue related to radiation, unspecified: Secondary | ICD-10-CM

## 2018-09-13 DIAGNOSIS — M6281 Muscle weakness (generalized): Secondary | ICD-10-CM

## 2018-09-13 DIAGNOSIS — I972 Postmastectomy lymphedema syndrome: Secondary | ICD-10-CM

## 2018-09-13 DIAGNOSIS — M25611 Stiffness of right shoulder, not elsewhere classified: Secondary | ICD-10-CM

## 2018-09-13 DIAGNOSIS — R293 Abnormal posture: Secondary | ICD-10-CM

## 2018-09-13 NOTE — Therapy (Signed)
[place], [place], [address] Phone: [phone]   Fax:  [phone]  Physical Therapy Treatment  Patient Details  Name: [name] MRN: [id number] Date of Birth: [birth year] Referring Provider (PT): [name]   Encounter Date: 09/13/2018  PT End of Session - 09/13/18 1224    Visit Number  2    Number of Visits  9    Date for PT Re-Evaluation  10/05/18    PT Start Time  0854    PT Stop Time  0932    PT Time Calculation (min)  38 min    Activity Tolerance  Patient tolerated treatment well    Behavior During Therapy  [facility] for tasks assessed/performed       Past Medical History:  Diagnosis Date  . Breast cancer ([place]) 2017   right breast  . Breast cancer of lower-inner quadrant of right female breast ([place]) 08/08/2015  . Colon polyps   . DDD (degenerative disc disease), cervical   . Degenerative joint disease   . Diabetes mellitus without complication ([place])   . Diverticulosis   . Gallstones   . GERD (gastroesophageal reflux disease)   . Headache(784.0)    migraine like per patient  . Hypertension   . Low back pain    dr phillips, pain management  . Osteoarthritis   . Personal history of chemotherapy 2017  . Personal history of radiation therapy 2017    Past Surgical History:  Procedure Laterality Date  . CHOLECYSTECTOMY  10/28/2011   Procedure: LAPAROSCOPIC CHOLECYSTECTOMY WITH INTRAOPERATIVE CHOLANGIOGRAM;  Surgeon: Earnstine Regal, MD;  Location: [facility];  Service: General;  Laterality: N/A;  . COLONOSCOPY WITH ESOPHAGOGASTRODUODENOSCOPY (EGD)  02/03/2013   with Propofol  . EXAM UNDER ANESTHESIA WITH MANIPULATION OF KNEE Right 05/30/2003  . EXAM UNDER ANESTHESIA WITH MANIPULATION OF KNEE Left 12/27/2002  . GANGLION CYST EXCISION Right    right wrist  . KNEE ARTHROSCOPY Bilateral 01/02/2000  . KNEE ARTHROSCOPY Right   . LAPAROSCOPIC VAGINAL HYSTERECTOMY WITH SALPINGO OOPHORECTOMY  Bilateral 07/13/2006  . LIPOMA EXCISION Right 02/21/2008   hip  . MASS EXCISION  08/24/2012   Procedure: EXCISION MASS;  Surgeon: Earnstine Regal, MD;  Location: [place];  Service: General;  Laterality: Left;  excisie soft tissue masses left shoulder(back) & left upper arm  . MASS EXCISION Left 01/24/2014   Procedure: EXCISION SOFT TISSUE MASSES LOWER LEFT BACK;  Surgeon: Earnstine Regal, MD;  Location: [place];  Service: General;  Laterality: Left;  Marland Kitchen MASS EXCISION Right 04/30/2016   Procedure: EXCISION OF SKIN RIGHT CHEST WALL;  Surgeon: Autumn Messing III, MD;  Location: [place];  Service: General;  Laterality: Right;  EXCISION OF SKIN RIGHT CHEST WALL  . MASTECTOMY Right 2017  . MASTECTOMY W/ SENTINEL NODE BIOPSY Right 10/01/2015   Procedure: RIGHT MASTECTOMY WITH SENTINEL LYMPH NODE BIOPSY;  Surgeon: Autumn Messing III, MD;  Location: [place];  Service: General;  Laterality: Right;  . PORT-A-CATH REMOVAL Left 04/30/2016   Procedure: REMOVAL PORT-A-CATH;  Surgeon: Autumn Messing III, MD;  Location: [facility];  Service: General;  Laterality: Left;  REMOVAL PORT-A-CATH  . PORTACATH PLACEMENT Left 11/01/2015   Procedure: INSERTION PORT-A-CATH;  Surgeon: Autumn Messing III, MD;  Location: [facility];  Service: General;  Laterality: Left;  . REPLACEMENT UNICONDYLAR JOINT KNEE Left 04/20/2001  . REVISION TOTAL KNEE ARTHROPLASTY Right 06/18/2004  . REVISION TOTAL KNEE  ARTHROPLASTY Left 11/15/2002  . REVISION TOTAL KNEE ARTHROPLASTY Left 10/12/2001  . REVISION TOTAL KNEE ARTHROPLASTY Right 07/09/2015  . TONSILLECTOMY    . TOTAL KNEE ARTHROPLASTY Right 04/25/2003  . TOTAL KNEE ARTHROPLASTY Left 06/25/2009    There were no vitals filed for this visit.  Subjective Assessment - 09/13/18 1212    Subjective  Pt states she is having pain and swelling in her right chest, arm and hand today.  She has a ring on her right hand that she knows needs to come  off.     Pertinent History  breast cancer with right  mastectomy with 20 lymph nodes removed in january 2017, chemo with peripheral neuropathy  and  had to have more skin removed before radiation radiation with skin disruption which was concluded last of October.  Past history includes multiple levels of degenerative disc disease in neck and spine and 4 knee surgeries on each knee with the last one in October 2016. Pt had back surgery in October 2019 - rods placed pt has to wear back brace - no bending, no lifting over 5 lbs    Patient Stated Goals  get arm back to where she can reach higher    Currently in Pain?  Yes    Pain Score  --   did not rate, but c/o pain in her right shoulder and axillary area                       [facility] Adult PT Treatment/Exercise - 09/13/18 0001      Manual Therapy   Manual Therapy  Edema management;Soft tissue mobilization;Myofascial release;Manual Lymphatic Drainage (MLD);Compression Bandaging    Manual therapy comments  with back brace in place, pt assisted to left sidelying.     Edema Management  discussed nighttime options and pt agrees to tribute vest and right arm sleeve.  Demographics sent to [facility] and script sent to Dr. Jana Hakim     Soft tissue mobilization  with patient in left sidelying , and with thick massage cream, soft tissue work to tightness in right upper traps and at posterior axilla under arm.      Myofascial Release  prolonged pressure at trigger point at area of latt inserion with pt doing active long axis external rotation and abduction.     Manual Lymphatic Drainage (MLD)  diaphragmatic breaths  anterior and posterior anastamosis, right upper arm, forearm down to hand and return     Compression Bandaging  on elastomull to right fingers 2,3,4, to assit with later attempts for pt to remove her ring                  PT Long Term Goals - 09/07/18 1223      PT LONG TERM GOAL #1   Title  Pt will demonstrate 145  degrees of right shoulder flexion to allow her to reach up in to cabinets    Baseline  107    Time  4    Period  Weeks    Status  New    Target Date  10/05/18      PT LONG TERM GOAL #2   Title  Pt will demonstrate 145 degrees of right shoulder abduction to allow pt to reach out to side    Baseline  95    Time  4    Period  Weeks    Status  New    Target Date  10/05/18  PT LONG TERM GOAL #3   Title  Pt will report a 50% decrease in tightness in right axilla with right shoulder movement    Time  4    Period  Weeks    Status  New    Target Date  10/05/18      PT LONG TERM GOAL #4   Title  Pt to be independent in a home exercise program for continued strengthening and stretching    Time  4    Period  Weeks    Status  New    Target Date  10/05/18      PT LONG TERM GOAL #5   Title  Pt to report a 50% improvement in right lateral trunk lymphedema to allow improved comfort    Time  4    Period  Weeks    Status  New    Target Date  10/05/18            Plan - 09/13/18 1225    Clinical Impression Statement  Pt continues with pain and swelling in right upper quadrants.  She got some relief from pain with manual work today.  She will benfit from new daytime and needs nighttime garments as she has never had any. Information sent today     Clinical Impairments Affecting Rehab Potential  20 lymph nodes removed, previous radiation     PT Frequency  2x / week    PT Duration  4 weeks    PT Treatment/Interventions  ADLs/Self Care Home Management;Patient/family education;Manual techniques;Scar mobilization;Passive range of motion;Therapeutic activities;Therapeutic exercise;Manual lymph drainage;Taping;Vasopneumatic Device;Compression bandaging;Joint Manipulations;Iontophoresis 4mg /ml Dexamethasone    PT Next Visit Plan  continue gentle PROM to right shoulder,with MLD and manual work as indicated  ensure pt log rolls to get up and down from mat table, pulleys, ball    Consulted and  Agree with Plan of Care  Patient       Patient will benefit from skilled therapeutic intervention in order to improve the following deficits and impairments:  Decreased knowledge of precautions, Decreased scar mobility, Decreased strength, Pain, Impaired UE functional use, Increased fascial restricitons, Postural dysfunction, Decreased range of motion, Increased edema  Visit Diagnosis: Stiffness of right shoulder, not elsewhere classified  Postmastectomy lymphedema  Muscle weakness (generalized)  Abnormal posture  Disorder of the skin and subcutaneous tissue related to radiation, unspecified     Problem List Patient Active Problem List   Diagnosis Date Noted  . DDD (degenerative disc disease), lumbar 07/16/2018    Class: Chronic  . Other spondylosis with radiculopathy, lumbar region 07/16/2018    Class: Chronic  . S/P lumbar fusion 07/13/2018  . Tenosynovitis, de Quervain 08/10/2017  . 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 Insomnia 07/16/2016  . Left groin pain 11/30/2015  . Severe obesity (BMI >= 40) ([place]) 10/26/2015  . Malignant neoplasm of lower-inner quadrant of right breast of female, estrogen receptor positive ([place]) 08/08/2015  . S/P revision of total knee 07/24/2015  . Acute postoperative pain 07/11/2015  . Loose total knee arthroplasty ([place]) 07/02/2015  . Type 2 diabetes mellitus without complications ([place]) 56/21/3086  . Adhesive capsulitis of left shoulder 10/11/2014  . Paraspinous mass, left 03/31/2013  . Other symptoms and signs involving the nervous system 03/31/2013  . Internal hemorrhoid 02/17/2013  . Abdominal pain, other specified site 02/17/2013  . Neoplasm of soft tissue, left shoulder and left upper arm 08/03/2012  . Lightheadedness 01/29/2012  . Cholelithiasis with cholecystitis 10/08/2011  . Hypokalemia 09/17/2011  .  Gastro-esophageal reflux disease without esophagitis 08/08/2010  . Gout 12/18/2008  . Essential (primary) hypertension 03/15/2007  . OA  (osteoarthritis) of knee 03/15/2007  . HEADACHE 03/15/2007   Donato Heinz. Owens Shark PT  Norwood Levo 09/13/2018, 12:28 PM  [place], [place], [address] Phone: [phone]   Fax:  [phone]  Name: KAILA DEVRIES MRN: [id number] Date of Birth: Dec 11, 1960

## 2018-09-14 DIAGNOSIS — C50911 Malignant neoplasm of unspecified site of right female breast: Secondary | ICD-10-CM | POA: Diagnosis not present

## 2018-09-23 ENCOUNTER — Ambulatory Visit: Payer: 59 | Attending: Oncology | Admitting: Physical Therapy

## 2018-09-23 ENCOUNTER — Encounter: Payer: Self-pay | Admitting: Physical Therapy

## 2018-09-23 DIAGNOSIS — L599 Disorder of the skin and subcutaneous tissue related to radiation, unspecified: Secondary | ICD-10-CM | POA: Insufficient documentation

## 2018-09-23 DIAGNOSIS — M25511 Pain in right shoulder: Secondary | ICD-10-CM | POA: Insufficient documentation

## 2018-09-23 DIAGNOSIS — M6281 Muscle weakness (generalized): Secondary | ICD-10-CM | POA: Insufficient documentation

## 2018-09-23 DIAGNOSIS — R293 Abnormal posture: Secondary | ICD-10-CM | POA: Diagnosis not present

## 2018-09-23 DIAGNOSIS — M25611 Stiffness of right shoulder, not elsewhere classified: Secondary | ICD-10-CM | POA: Insufficient documentation

## 2018-09-23 DIAGNOSIS — I972 Postmastectomy lymphedema syndrome: Secondary | ICD-10-CM | POA: Diagnosis not present

## 2018-09-23 NOTE — Therapy (Signed)
[place], [place], [address] Phone: [phone]   Fax:  [phone]  Physical Therapy Treatment  Patient Details  Name: [name] MRN: [id number] Date of Birth: 1961-03-28 Referring Provider (PT): [name]   Encounter Date: 09/23/2018  PT End of Session - 09/23/18 1346    Visit Number  3    Number of Visits  9    Date for PT Re-Evaluation  10/05/18    PT Start Time  4166    PT Stop Time  0630    PT Time Calculation (min)  41 min    Activity Tolerance  Patient tolerated treatment well    Behavior During Therapy  [facility] for tasks assessed/performed       Past Medical History:  Diagnosis Date  . Breast cancer ([place]) 2017   right breast  . Breast cancer of lower-inner quadrant of right female breast ([place]) 08/08/2015  . Colon polyps   . DDD (degenerative disc disease), cervical   . Degenerative joint disease   . Diabetes mellitus without complication ([place])   . Diverticulosis   . Gallstones   . GERD (gastroesophageal reflux disease)   . Headache(784.0)    migraine like per patient  . Hypertension   . Low back pain    dr phillips, pain management  . Osteoarthritis   . Personal history of chemotherapy 2017  . Personal history of radiation therapy 2017    Past Surgical History:  Procedure Laterality Date  . CHOLECYSTECTOMY  10/28/2011   Procedure: LAPAROSCOPIC CHOLECYSTECTOMY WITH INTRAOPERATIVE CHOLANGIOGRAM;  Surgeon: Earnstine Regal, MD;  [facility];  Service: General;  Laterality: N/A;  . COLONOSCOPY WITH ESOPHAGOGASTRODUODENOSCOPY (EGD)  02/03/2013   with Propofol  . EXAM UNDER ANESTHESIA WITH MANIPULATION OF KNEE Right 05/30/2003  . EXAM UNDER ANESTHESIA WITH MANIPULATION OF KNEE Left 12/27/2002  . GANGLION CYST EXCISION Right    right wrist  . KNEE ARTHROSCOPY Bilateral 01/02/2000  . KNEE ARTHROSCOPY Right   . LAPAROSCOPIC VAGINAL HYSTERECTOMY WITH SALPINGO OOPHORECTOMY  Bilateral 07/13/2006  . LIPOMA EXCISION Right 02/21/2008   hip  . MASS EXCISION  08/24/2012   Procedure: EXCISION MASS;  Surgeon: Earnstine Regal, MD;  Location: [facility];  Service: General;  Laterality: Left;  excisie soft tissue masses left shoulder(back) & left upper arm  . MASS EXCISION Left 01/24/2014   Procedure: EXCISION SOFT TISSUE MASSES LOWER LEFT BACK;  Surgeon: Earnstine Regal, MD;  Location: [facility];  Service: General;  Laterality: Left;  Marland Kitchen MASS EXCISION Right 04/30/2016   Procedure: EXCISION OF SKIN RIGHT CHEST WALL;  Surgeon: Autumn Messing III, MD;  Location: [facility];  Service: General;  Laterality: Right;  EXCISION OF SKIN RIGHT CHEST WALL  . MASTECTOMY Right 2017  . MASTECTOMY W/ SENTINEL NODE BIOPSY Right 10/01/2015   Procedure: RIGHT MASTECTOMY WITH SENTINEL LYMPH NODE BIOPSY;  Surgeon: Autumn Messing III, MD;  Location: [facility];  Service: General;  Laterality: Right;  . PORT-A-CATH REMOVAL Left 04/30/2016   Procedure: REMOVAL PORT-A-CATH;  Surgeon: Autumn Messing III, MD;  Location: [place];  Service: General;  Laterality: Left;  REMOVAL PORT-A-CATH  . PORTACATH PLACEMENT Left 11/01/2015   Procedure: INSERTION PORT-A-CATH;  Surgeon: Autumn Messing III, MD;  Location: [facility];  Service: General;  Laterality: Left;  . REPLACEMENT UNICONDYLAR JOINT KNEE Left 04/20/2001  . REVISION TOTAL KNEE ARTHROPLASTY Right 06/18/2004  . REVISION TOTAL KNEE  ARTHROPLASTY Left 11/15/2002  . REVISION TOTAL KNEE ARTHROPLASTY Left 10/12/2001  . REVISION TOTAL KNEE ARTHROPLASTY Right 07/09/2015  . TONSILLECTOMY    . TOTAL KNEE ARTHROPLASTY Right 04/25/2003  . TOTAL KNEE ARTHROPLASTY Left 06/25/2009    There were no vitals filed for this visit.  Subjective Assessment - 09/23/18 1310    Subjective  I am having some pain today. I feel like my shoulder is moving a little better.     Pertinent History  breast cancer with right  mastectomy  with 20 lymph nodes removed in january 2017, chemo with peripheral neuropathy  and  had to have more skin removed before radiation radiation with skin disruption which was concluded last of October.  Past history includes multiple levels of degenerative disc disease in neck and spine and 4 knee surgeries on each knee with the last one in October 2016. Pt had back surgery in October 2019 - rods placed pt has to wear back brace - no bending, no lifting over 5 lbs    Patient Stated Goals  get arm back to where she can reach higher    Currently in Pain?  Yes    Pain Score  5     Pain Location  Axilla                       [facility] Adult PT Treatment/Exercise - 09/23/18 0001      Exercises   Exercises  Shoulder      Shoulder Exercises: Pulleys   Flexion  2 minutes   pt returned therapist demo   ABduction  2 minutes   pt returned therapist demo     Shoulder Exercises: Therapy Ball   Flexion  10 reps   pt returned therapist demo     Manual Therapy   Manual Therapy  Passive ROM    Soft tissue mobilization  with pt in left sidelying to right posterior axilla and external rotators in area of tightness    Manual Lymphatic Drainage (MLD)  5 diaphragmatic breaths, short neck, right inguinal nodes and establishment of axillo inguinal pathway, RUE working proximal to distal then retracing steps spending extra time on right lateral trunk    Passive ROM  to R shoulder in direction of flexion and abduction                  PT Long Term Goals - 09/07/18 1223      PT LONG TERM GOAL #1   Title  Pt will demonstrate 145 degrees of right shoulder flexion to allow her to reach up in to cabinets    Baseline  107    Time  4    Period  Weeks    Status  New    Target Date  10/05/18      PT LONG TERM GOAL #2   Title  Pt will demonstrate 145 degrees of right shoulder abduction to allow pt to reach out to side    Baseline  95    Time  4    Period  Weeks    Status  New    Target  Date  10/05/18      PT LONG TERM GOAL #3   Title  Pt will report a 50% decrease in tightness in right axilla with right shoulder movement    Time  4    Period  Weeks    Status  New    Target Date  10/05/18  PT LONG TERM GOAL #4   Title  Pt to be independent in a home exercise program for continued strengthening and stretching    Time  4    Period  Weeks    Status  New    Target Date  10/05/18      PT LONG TERM GOAL #5   Title  Pt to report a 50% improvement in right lateral trunk lymphedema to allow improved comfort    Time  4    Period  Weeks    Status  New    Target Date  10/05/18            Plan - 09/23/18 1347    Clinical Impression Statement  Continued with manual therapy to right posterior axilla in area of tightness. She reports increased ability to move RUE after therapy today. Will send pt's compression garment prescription to [place] with [facility] for compression garments per pt's request.     Rehab Potential  Good    Clinical Impairments Affecting Rehab Potential  20 lymph nodes removed, previous radiation     PT Frequency  2x / week    PT Duration  4 weeks    PT Treatment/Interventions  ADLs/Self Care Home Management;Patient/family education;Manual techniques;Scar mobilization;Passive range of motion;Therapeutic activities;Therapeutic exercise;Manual lymph drainage;Taping;Vasopneumatic Device;Compression bandaging;Joint Manipulations;Iontophoresis 4mg /ml Dexamethasone    PT Next Visit Plan  continue gentle PROM to right shoulder,with MLD and manual work as indicated  ensure pt log rolls to get up and down from mat table, pulleys, ball    PT Home Exercise Plan  cane exercises in supine, post op breast exercises       Patient will benefit from skilled therapeutic intervention in order to improve the following deficits and impairments:  Decreased knowledge of precautions, Decreased scar mobility, Decreased strength, Pain, Impaired UE functional use, Increased  fascial restricitons, Postural dysfunction, Decreased range of motion, Increased edema  Visit Diagnosis: Stiffness of right shoulder, not elsewhere classified  Postmastectomy lymphedema  Abnormal posture     Problem List Patient Active Problem List   Diagnosis Date Noted  . DDD (degenerative disc disease), lumbar 07/16/2018    Class: Chronic  . Other spondylosis with radiculopathy, lumbar region 07/16/2018    Class: Chronic  . S/P lumbar fusion 07/13/2018  . Tenosynovitis, de Quervain 08/10/2017  . 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 Insomnia 07/16/2016  . Left groin pain 11/30/2015  . Severe obesity (BMI >= 40) ([place]) 10/26/2015  . Malignant neoplasm of lower-inner quadrant of right breast of female, estrogen receptor positive ([place]) 08/08/2015  . S/P revision of total knee 07/24/2015  . Acute postoperative pain 07/11/2015  . Loose total knee arthroplasty ([place]) 07/02/2015  . Type 2 diabetes mellitus without complications ([place]) 60/06/9322  . Adhesive capsulitis of left shoulder 10/11/2014  . Paraspinous mass, left 03/31/2013  . Other symptoms and signs involving the nervous system 03/31/2013  . Internal hemorrhoid 02/17/2013  . Abdominal pain, other specified site 02/17/2013  . Neoplasm of soft tissue, left shoulder and left upper arm 08/03/2012  . Lightheadedness 01/29/2012  . Cholelithiasis with cholecystitis 10/08/2011  . Hypokalemia 09/17/2011  . Gastro-esophageal reflux disease without esophagitis 08/08/2010  . Gout 12/18/2008  . Essential (primary) hypertension 03/15/2007  . OA (osteoarthritis) of knee 03/15/2007  . HEADACHE 03/15/2007    Allyson Sabal [facility] 09/23/2018, 1:50 PM  [place], [place], [address] Phone: [phone]   Fax:  [phone]  Name: [name]  MRN: [id number] Date of Birth: July 24, 1961  Allyson Sabal [place], PT 09/23/18 1:50 PM

## 2018-09-27 DIAGNOSIS — M150 Primary generalized (osteo)arthritis: Secondary | ICD-10-CM | POA: Diagnosis not present

## 2018-09-27 DIAGNOSIS — Z79891 Long term (current) use of opiate analgesic: Secondary | ICD-10-CM | POA: Diagnosis not present

## 2018-09-27 DIAGNOSIS — G894 Chronic pain syndrome: Secondary | ICD-10-CM | POA: Diagnosis not present

## 2018-09-28 ENCOUNTER — Encounter: Payer: 59 | Admitting: Physical Therapy

## 2018-09-30 ENCOUNTER — Encounter: Payer: 59 | Admitting: Physical Therapy

## 2018-10-07 ENCOUNTER — Other Ambulatory Visit: Payer: Self-pay

## 2018-10-07 ENCOUNTER — Encounter: Payer: Self-pay | Admitting: Physical Therapy

## 2018-10-07 ENCOUNTER — Ambulatory Visit: Payer: 59 | Admitting: Physical Therapy

## 2018-10-07 DIAGNOSIS — I972 Postmastectomy lymphedema syndrome: Secondary | ICD-10-CM

## 2018-10-07 DIAGNOSIS — M6281 Muscle weakness (generalized): Secondary | ICD-10-CM

## 2018-10-07 DIAGNOSIS — M25511 Pain in right shoulder: Secondary | ICD-10-CM

## 2018-10-07 DIAGNOSIS — M25611 Stiffness of right shoulder, not elsewhere classified: Secondary | ICD-10-CM

## 2018-10-07 DIAGNOSIS — R293 Abnormal posture: Secondary | ICD-10-CM

## 2018-10-07 DIAGNOSIS — L599 Disorder of the skin and subcutaneous tissue related to radiation, unspecified: Secondary | ICD-10-CM

## 2018-10-07 NOTE — Therapy (Signed)
[place], [place], [address] Phone: [phone]   Fax:  [phone]  Physical Therapy Treatment  Patient Details  Name: [name] MRN: [id number] Date of Birth: 1960/10/30 Referring Provider (PT): [name]   Encounter Date: 10/07/2018  PT End of Session - 10/07/18 1627    Visit Number  4    Number of Visits  17    Date for PT Re-Evaluation  11/04/18    Authorization Type  30 visit limit- PT/OT/ST combined    PT Start Time  1301    PT Stop Time  1350    PT Time Calculation (min)  49 min    Activity Tolerance  Patient tolerated treatment well    Behavior During Therapy  [facility] for tasks assessed/performed       Past Medical History:  Diagnosis Date  . Breast cancer ([place]) 2017   right breast  . Breast cancer of lower-inner quadrant of right female breast ([place]) 08/08/2015  . Colon polyps   . DDD (degenerative disc disease), cervical   . Degenerative joint disease   . Diabetes mellitus without complication ([place])   . Diverticulosis   . Gallstones   . GERD (gastroesophageal reflux disease)   . Headache(784.0)    migraine like per patient  . Hypertension   . Low back pain    dr phillips, pain management  . Osteoarthritis   . Personal history of chemotherapy 2017  . Personal history of radiation therapy 2017    Past Surgical History:  Procedure Laterality Date  . CHOLECYSTECTOMY  10/28/2011   Procedure: LAPAROSCOPIC CHOLECYSTECTOMY WITH INTRAOPERATIVE CHOLANGIOGRAM;  Surgeon: Earnstine Regal, MD;  [facility];  Service: General;  Laterality: N/A;  . COLONOSCOPY WITH ESOPHAGOGASTRODUODENOSCOPY (EGD)  02/03/2013   with Propofol  . EXAM UNDER ANESTHESIA WITH MANIPULATION OF KNEE Right 05/30/2003  . EXAM UNDER ANESTHESIA WITH MANIPULATION OF KNEE Left 12/27/2002  . GANGLION CYST EXCISION Right    right wrist  . KNEE ARTHROSCOPY Bilateral 01/02/2000  . KNEE ARTHROSCOPY Right   .  LAPAROSCOPIC VAGINAL HYSTERECTOMY WITH SALPINGO OOPHORECTOMY Bilateral 07/13/2006  . LIPOMA EXCISION Right 02/21/2008   hip  . MASS EXCISION  08/24/2012   Procedure: EXCISION MASS;  Surgeon: Earnstine Regal, MD;  Location: [place];  Service: General;  Laterality: Left;  excisie soft tissue masses left shoulder(back) & left upper arm  . MASS EXCISION Left 01/24/2014   Procedure: EXCISION SOFT TISSUE MASSES LOWER LEFT BACK;  Surgeon: Earnstine Regal, MD;  Location: [place];  Service: General;  Laterality: Left;  Marland Kitchen MASS EXCISION Right 04/30/2016   Procedure: EXCISION OF SKIN RIGHT CHEST WALL;  Surgeon: Autumn Messing III, MD;  Location: [place];  Service: General;  Laterality: Right;  EXCISION OF SKIN RIGHT CHEST WALL  . MASTECTOMY Right 2017  . MASTECTOMY W/ SENTINEL NODE BIOPSY Right 10/01/2015   Procedure: RIGHT MASTECTOMY WITH SENTINEL LYMPH NODE BIOPSY;  Surgeon: Autumn Messing III, MD;  Location: [place];  Service: General;  Laterality: Right;  . PORT-A-CATH REMOVAL Left 04/30/2016   Procedure: REMOVAL PORT-A-CATH;  Surgeon: Autumn Messing III, MD;  Location: [place];  Service: General;  Laterality: Left;  REMOVAL PORT-A-CATH  . PORTACATH PLACEMENT Left 11/01/2015   Procedure: INSERTION PORT-A-CATH;  Surgeon: Autumn Messing III, MD;  Location: [place];  Service: General;  Laterality: Left;  . REPLACEMENT UNICONDYLAR JOINT KNEE Left 04/20/2001  .  REVISION TOTAL KNEE ARTHROPLASTY Right 06/18/2004  . REVISION TOTAL KNEE ARTHROPLASTY Left 11/15/2002  . REVISION TOTAL KNEE ARTHROPLASTY Left 10/12/2001  . REVISION TOTAL KNEE ARTHROPLASTY Right 07/09/2015  . TONSILLECTOMY    . TOTAL KNEE ARTHROPLASTY Right 04/25/2003  . TOTAL KNEE ARTHROPLASTY Left 06/25/2009    There were no vitals filed for this visit.  Subjective Assessment - 10/07/18 1302    Subjective  My shoulder and arm are doing pretty good. THe pain is better but I have more pain  at night.     Pertinent History  breast cancer with right  mastectomy with 20 lymph nodes removed in january 2017, chemo with peripheral neuropathy  and  had to have more skin removed before radiation radiation with skin disruption which was concluded last of October.  Past history includes multiple levels of degenerative disc disease in neck and spine and 4 knee surgeries on each knee with the last one in October 2016. Pt had back surgery in October 2019 - rods placed pt has to wear back brace - no bending, no lifting over 5 lbs    Patient Stated Goals  get arm back to where she can reach higher    Currently in Pain?  Yes    Pain Score  6     Pain Location  Neck                       [facility] Adult PT Treatment/Exercise - 10/07/18 0001      Shoulder Exercises: Pulleys   Flexion  2 minutes   pt returned therapist demo   ABduction  2 minutes   pt returned therapist demo     Shoulder Exercises: Therapy Ball   Flexion  10 reps   pt returned therapist demo   ABduction  Right;10 reps   pt returned therapist demo     Manual Therapy   Soft tissue mobilization  across right mastectomy scar and to right lateral trunk in area of tightness    Passive ROM  to R shoulder in direction of flexion and abduction                  PT Long Term Goals - 10/07/18 1303      PT LONG TERM GOAL #1   Title  Pt will demonstrate 145 degrees of right shoulder flexion to allow her to reach up in to cabinets    Baseline  107, 10/07/18- 140    Time  4    Period  Weeks    Status  On-going      PT LONG TERM GOAL #2   Title  Pt will demonstrate 145 degrees of right shoulder abduction to allow pt to reach out to side    Baseline  95, 10/07/18- 140     Time  4    Period  Weeks    Status  On-going      PT LONG TERM GOAL #3   Title  Pt will report a 50% decrease in tightness in right axilla with right shoulder movement    Baseline  10/07/18- 30%     Time  4    Period  Weeks    Status   On-going      PT LONG TERM GOAL #4   Title  Pt to be independent in a home exercise program for continued strengthening and stretching    Time  4    Period  Weeks  Status  On-going      PT LONG TERM GOAL #5   Title  Pt to report a 50% improvement in right lateral trunk lymphedema to allow improved comfort    Baseline  10/07/18- 40%    Time  4    Period  Weeks    Status  On-going            Plan - 10/07/18 1628    Clinical Impression Statement  Assessed pt's progress towards goal in therapy. She is progressing towards all goals and is just 5 degrees shy of meeting her flexion and abduction goal. She is still having considerable tightness across her pec muscle and lateral trunk. Pt would benefit from addional skilled PT visits to further progress pt towards ROM goals and decrease tightness.     Rehab Potential  Good    Clinical Impairments Affecting Rehab Potential  20 lymph nodes removed, previous radiation     PT Frequency  2x / week    PT Duration  4 weeks    PT Treatment/Interventions  ADLs/Self Care Home Management;Patient/family education;Manual techniques;Scar mobilization;Passive range of motion;Therapeutic activities;Therapeutic exercise;Manual lymph drainage;Taping;Vasopneumatic Device;Compression bandaging;Joint Manipulations;Iontophoresis 4mg /ml Dexamethasone    PT Next Visit Plan  continue gentle PROM to right shoulder,with MLD and manual work as indicated  ensure pt log rolls to get up and down from mat table, pulleys, ball    PT Home Exercise Plan  cane exercises in supine, post op breast exercises    Consulted and Agree with Plan of Care  Patient       Patient will benefit from skilled therapeutic intervention in order to improve the following deficits and impairments:  Decreased knowledge of precautions, Decreased scar mobility, Decreased strength, Pain, Impaired UE functional use, Increased fascial restricitons, Postural dysfunction, Decreased range of motion,  Increased edema  Visit Diagnosis: Stiffness of right shoulder, not elsewhere classified  Abnormal posture     Problem List Patient Active Problem List   Diagnosis Date Noted  . DDD (degenerative disc disease), lumbar 07/16/2018    Class: Chronic  . Other spondylosis with radiculopathy, lumbar region 07/16/2018    Class: Chronic  . S/P lumbar fusion 07/13/2018  . Tenosynovitis, de Quervain 08/10/2017  . 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 Insomnia 07/16/2016  . Left groin pain 11/30/2015  . Severe obesity (BMI >= 40) ([place]) 10/26/2015  . Malignant neoplasm of lower-inner quadrant of right breast of female, estrogen receptor positive ([place]) 08/08/2015  . S/P revision of total knee 07/24/2015  . Acute postoperative pain 07/11/2015  . Loose total knee arthroplasty ([place]) 07/02/2015  . Type 2 diabetes mellitus without complications ([place]) 99/37/1696  . Adhesive capsulitis of left shoulder 10/11/2014  . Paraspinous mass, left 03/31/2013  . Other symptoms and signs involving the nervous system 03/31/2013  . Internal hemorrhoid 02/17/2013  . Abdominal pain, other specified site 02/17/2013  . Neoplasm of soft tissue, left shoulder and left upper arm 08/03/2012  . Lightheadedness 01/29/2012  . Cholelithiasis with cholecystitis 10/08/2011  . Hypokalemia 09/17/2011  . Gastro-esophageal reflux disease without esophagitis 08/08/2010  . Gout 12/18/2008  . Essential (primary) hypertension 03/15/2007  . OA (osteoarthritis) of knee 03/15/2007  . HEADACHE 03/15/2007    Allyson Sabal [facility] 10/07/2018, 4:31 PM  [place], [place], [address] Phone: [phone]   Fax:  [phone]  Name: [name] MRN: [id number] Date of Birth: Sep 06, 1961  Manus Gunning, PT 10/07/18 4:31 PM

## 2018-10-08 ENCOUNTER — Other Ambulatory Visit: Payer: Self-pay | Admitting: Oncology

## 2018-10-08 DIAGNOSIS — Z1231 Encounter for screening mammogram for malignant neoplasm of breast: Secondary | ICD-10-CM

## 2018-10-11 ENCOUNTER — Ambulatory Visit (INDEPENDENT_AMBULATORY_CARE_PROVIDER_SITE_OTHER): Payer: 59 | Admitting: Specialist

## 2018-10-12 ENCOUNTER — Other Ambulatory Visit: Payer: Self-pay

## 2018-10-12 ENCOUNTER — Ambulatory Visit: Payer: 59 | Admitting: Physical Therapy

## 2018-10-12 ENCOUNTER — Encounter: Payer: Self-pay | Admitting: Physical Therapy

## 2018-10-12 DIAGNOSIS — M25611 Stiffness of right shoulder, not elsewhere classified: Secondary | ICD-10-CM

## 2018-10-12 DIAGNOSIS — R293 Abnormal posture: Secondary | ICD-10-CM

## 2018-10-12 NOTE — Patient Instructions (Signed)

## 2018-10-12 NOTE — Therapy (Signed)
[place], [place], [address] Phone: [phone]   Fax:  [phone]  Physical Therapy Treatment  Patient Details  Name: [name] MRN: [id number] Date of Birth: 1961-08-18 Referring Provider (PT): [name]   Encounter Date: 10/12/2018  PT End of Session - 10/12/18 1425    Visit Number  5    Number of Visits  17    Date for PT Re-Evaluation  11/04/18    Authorization Type  30 visit limit- PT/OT/ST combined    PT Start Time  1304    PT Stop Time  1345    PT Time Calculation (min)  41 min    Activity Tolerance  Patient tolerated treatment well    Behavior During Therapy  [facility] for tasks assessed/performed       Past Medical History:  Diagnosis Date  . Breast cancer ([place]) 2017   right breast  . Breast cancer of lower-inner quadrant of right female breast ([place]) 08/08/2015  . Colon polyps   . DDD (degenerative disc disease), cervical   . Degenerative joint disease   . Diabetes mellitus without complication ([place])   . Diverticulosis   . Gallstones   . GERD (gastroesophageal reflux disease)   . Headache(784.0)    migraine like per patient  . Hypertension   . Low back pain    dr phillips, pain management  . Osteoarthritis   . Personal history of chemotherapy 2017  . Personal history of radiation therapy 2017    Past Surgical History:  Procedure Laterality Date  . CHOLECYSTECTOMY  10/28/2011   Procedure: LAPAROSCOPIC CHOLECYSTECTOMY WITH INTRAOPERATIVE CHOLANGIOGRAM;  Surgeon: Earnstine Regal, MD;  [facility];  Service: General;  Laterality: N/A;  . COLONOSCOPY WITH ESOPHAGOGASTRODUODENOSCOPY (EGD)  02/03/2013   with Propofol  . EXAM UNDER ANESTHESIA WITH MANIPULATION OF KNEE Right 05/30/2003  . EXAM UNDER ANESTHESIA WITH MANIPULATION OF KNEE Left 12/27/2002  . GANGLION CYST EXCISION Right    right wrist  . KNEE ARTHROSCOPY Bilateral 01/02/2000  . KNEE ARTHROSCOPY Right   .  LAPAROSCOPIC VAGINAL HYSTERECTOMY WITH SALPINGO OOPHORECTOMY Bilateral 07/13/2006  . LIPOMA EXCISION Right 02/21/2008   hip  . MASS EXCISION  08/24/2012   Procedure: EXCISION MASS;  Surgeon: Earnstine Regal, MD;  Location: [place];  Service: General;  Laterality: Left;  excisie soft tissue masses left shoulder(back) & left upper arm  . MASS EXCISION Left 01/24/2014   Procedure: EXCISION SOFT TISSUE MASSES LOWER LEFT BACK;  Surgeon: Earnstine Regal, MD;  Location: [place];  Service: General;  Laterality: Left;  Marland Kitchen MASS EXCISION Right 04/30/2016   Procedure: EXCISION OF SKIN RIGHT CHEST WALL;  Surgeon: Autumn Messing III, MD;  Location: [place];  Service: General;  Laterality: Right;  EXCISION OF SKIN RIGHT CHEST WALL  . MASTECTOMY Right 2017  . MASTECTOMY W/ SENTINEL NODE BIOPSY Right 10/01/2015   Procedure: RIGHT MASTECTOMY WITH SENTINEL LYMPH NODE BIOPSY;  Surgeon: Autumn Messing III, MD;  Location: [place];  Service: General;  Laterality: Right;  . PORT-A-CATH REMOVAL Left 04/30/2016   Procedure: REMOVAL PORT-A-CATH;  Surgeon: Autumn Messing III, MD;  Location: [place];  Service: General;  Laterality: Left;  REMOVAL PORT-A-CATH  . PORTACATH PLACEMENT Left 11/01/2015   Procedure: INSERTION PORT-A-CATH;  Surgeon: Autumn Messing III, MD;  Location: [place];  Service: General;  Laterality: Left;  . REPLACEMENT UNICONDYLAR JOINT KNEE Left 04/20/2001  .  REVISION TOTAL KNEE ARTHROPLASTY Right 06/18/2004  . REVISION TOTAL KNEE ARTHROPLASTY Left 11/15/2002  . REVISION TOTAL KNEE ARTHROPLASTY Left 10/12/2001  . REVISION TOTAL KNEE ARTHROPLASTY Right 07/09/2015  . TONSILLECTOMY    . TOTAL KNEE ARTHROPLASTY Right 04/25/2003  . TOTAL KNEE ARTHROPLASTY Left 06/25/2009    There were no vitals filed for this visit.  Subjective Assessment - 10/12/18 1316    Subjective  I am having pain in my wrist and in my knees. The cold weather is not helping.      Pertinent History  breast cancer with right  mastectomy with 20 lymph nodes removed in january 2017, chemo with peripheral neuropathy  and  had to have more skin removed before radiation radiation with skin disruption which was concluded last of October.  Past history includes multiple levels of degenerative disc disease in neck and spine and 4 knee surgeries on each knee with the last one in October 2016. Pt had back surgery in October 2019 - rods placed pt has to wear back brace - no bending, no lifting over 5 lbs    Patient Stated Goals  get arm back to where she can reach higher    Currently in Pain?  Yes    Pain Score  4     Pain Location  Arm    Pain Orientation  Right    Pain Descriptors / Indicators  Sharp    Pain Type  Acute pain    Pain Onset  Yesterday    Pain Frequency  Intermittent                       [facility] Adult PT Treatment/Exercise - 10/12/18 0001      Shoulder Exercises: Supine   Horizontal ABduction  Strengthening;Both;10 reps   pt returned therapist demo   Theraband Level (Shoulder Horizontal ABduction)  Level 1 (Yellow)    External Rotation  Strengthening;Both;10 reps   pt returned therapist demo   Theraband Level (Shoulder External Rotation)  Level 1 (Yellow)    Flexion  Strengthening;Both;10 reps   narrow and wide grip, pt returned therapist demo   Theraband Level (Shoulder Flexion)  Level 1 (Yellow)    Diagonals  Strengthening;Both;10 reps   pt returned therapist demo   Theraband Level (Shoulder Diagonals)  Level 1 (Yellow)    Other Supine Exercises  supine with arms outstretched and elbows straight for pec stretch with 30 sec holds      Shoulder Exercises: Pulleys   Flexion  2 minutes   pt returned therapist demo   ABduction  2 minutes   pt returned therapist demo     Shoulder Exercises: Therapy Ball   Flexion  10 reps   pt returned therapist demo   ABduction  Right;10 reps   pt returned therapist demo     Manual Therapy   Soft  tissue mobilization  across right mastectomy scar and to right lateral trunk in area of tightness    Passive ROM  to R shoulder in direction of flexion and abduction                  PT Long Term Goals - 10/07/18 1303      PT LONG TERM GOAL #1   Title  Pt will demonstrate 145 degrees of right shoulder flexion to allow her to reach up in to cabinets    Baseline  107, 10/07/18- 140    Time  4    Period  Weeks    Status  On-going      PT LONG TERM GOAL #2   Title  Pt will demonstrate 145 degrees of right shoulder abduction to allow pt to reach out to side    Baseline  95, 10/07/18- 140     Time  4    Period  Weeks    Status  On-going      PT LONG TERM GOAL #3   Title  Pt will report a 50% decrease in tightness in right axilla with right shoulder movement    Baseline  10/07/18- 30%     Time  4    Period  Weeks    Status  On-going      PT LONG TERM GOAL #4   Title  Pt to be independent in a home exercise program for continued strengthening and stretching    Time  4    Period  Weeks    Status  On-going      PT LONG TERM GOAL #5   Title  Pt to report a 50% improvement in right lateral trunk lymphedema to allow improved comfort    Baseline  10/07/18- 40%    Time  4    Period  Weeks    Status  On-going            Plan - 10/12/18 1425    Clinical Impression Statement  Pt is still having tightness in right lateral trunk and tightness in right shoulder. She reports she recently has had pain in right upper arm. Instructed pt in supine scapular series to improve posture and decrease pain today and issued these as part of a home exercise program. Continued with soft tissue mobilization to right trunk.     Rehab Potential  Good    Clinical Impairments Affecting Rehab Potential  20 lymph nodes removed, previous radiation     PT Frequency  2x / week    PT Duration  4 weeks    PT Treatment/Interventions  ADLs/Self Care Home Management;Patient/family education;Manual  techniques;Scar mobilization;Passive range of motion;Therapeutic activities;Therapeutic exercise;Manual lymph drainage;Taping;Vasopneumatic Device;Compression bandaging;Joint Manipulations;Iontophoresis 4mg /ml Dexamethasone    PT Next Visit Plan  assess arm pain and see if supine scap series helped, continue gentle PROM to right shoulder,with MLD and manual work as indicated  ensure pt log rolls to get up and down from mat table, pulleys, ball    PT Home Exercise Plan  cane exercises in supine, post op breast exercises, supine scap series       Patient will benefit from skilled therapeutic intervention in order to improve the following deficits and impairments:  Decreased knowledge of precautions, Decreased scar mobility, Decreased strength, Pain, Impaired UE functional use, Increased fascial restricitons, Postural dysfunction, Decreased range of motion, Increased edema  Visit Diagnosis: Stiffness of right shoulder, not elsewhere classified  Abnormal posture     Problem List Patient Active Problem List   Diagnosis Date Noted  . DDD (degenerative disc disease), lumbar 07/16/2018    Class: Chronic  . Other spondylosis with radiculopathy, lumbar region 07/16/2018    Class: Chronic  . S/P lumbar fusion 07/13/2018  . Tenosynovitis, de Quervain 08/10/2017  . 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 Insomnia 07/16/2016  . Left groin pain 11/30/2015  . Severe obesity (BMI >= 40) ([place]) 10/26/2015  . Malignant neoplasm of lower-inner quadrant of right breast of female, estrogen receptor positive ([place]) 08/08/2015  . S/P revision of total knee 07/24/2015  . Acute postoperative pain 07/11/2015  .  Loose total knee arthroplasty ([place]) 07/02/2015  . Type 2 diabetes mellitus without complications ([place]) 04/54/0981  . Adhesive capsulitis of left shoulder 10/11/2014  . Paraspinous mass, left 03/31/2013  . Other symptoms and signs involving the nervous system 03/31/2013  . Internal hemorrhoid 02/17/2013  .  Abdominal pain, other specified site 02/17/2013  . Neoplasm of soft tissue, left shoulder and left upper arm 08/03/2012  . Lightheadedness 01/29/2012  . Cholelithiasis with cholecystitis 10/08/2011  . Hypokalemia 09/17/2011  . Gastro-esophageal reflux disease without esophagitis 08/08/2010  . Gout 12/18/2008  . Essential (primary) hypertension 03/15/2007  . OA (osteoarthritis) of knee 03/15/2007  . HEADACHE 03/15/2007    Allyson Sabal [facility] 10/12/2018, 2:27 PM  [place], [place], [address] Phone: [phone]   Fax:  [phone]  Name: [name] MRN: [id number] Date of Birth: 21-Aug-1961  Manus Gunning, PT 10/12/18 2:27 PM

## 2018-10-14 ENCOUNTER — Encounter: Payer: 59 | Admitting: Physical Therapy

## 2018-10-18 ENCOUNTER — Ambulatory Visit: Payer: 59 | Admitting: Rehabilitation

## 2018-10-18 ENCOUNTER — Encounter: Payer: Self-pay | Admitting: Rehabilitation

## 2018-10-18 ENCOUNTER — Ambulatory Visit
Admission: RE | Admit: 2018-10-18 | Discharge: 2018-10-18 | Disposition: A | Discharge status: Home / Self Care | Payer: 59 | Source: Ambulatory Visit | Attending: Oncology | Admitting: Oncology

## 2018-10-18 DIAGNOSIS — Z1231 Encounter for screening mammogram for malignant neoplasm of breast: Secondary | ICD-10-CM | POA: Diagnosis not present

## 2018-10-18 DIAGNOSIS — I972 Postmastectomy lymphedema syndrome: Secondary | ICD-10-CM

## 2018-10-18 DIAGNOSIS — M25511 Pain in right shoulder: Secondary | ICD-10-CM

## 2018-10-18 DIAGNOSIS — M25611 Stiffness of right shoulder, not elsewhere classified: Secondary | ICD-10-CM | POA: Diagnosis not present

## 2018-10-18 DIAGNOSIS — R293 Abnormal posture: Secondary | ICD-10-CM

## 2018-10-18 DIAGNOSIS — M6281 Muscle weakness (generalized): Secondary | ICD-10-CM

## 2018-10-18 NOTE — Therapy (Addendum)
[place], [place], [address] Phone: [phone]   Fax:  [phone]  Physical Therapy Treatment  Patient Details  Name: [name] MRN: [id number] Date of Birth: April 30, 1961 Referring Provider (PT): [name]   Encounter Date: 10/18/2018  PT End of Session - 10/18/18 1514    Visit Number  6    Number of Visits  17    Date for PT Re-Evaluation  11/04/18    Authorization Type  30 visit limit- PT/OT/ST combined    PT Start Time  1430    PT Stop Time  1513    PT Time Calculation (min)  43 min    Activity Tolerance  Patient tolerated treatment well    Behavior During Therapy  [facility] for tasks assessed/performed       Past Medical History:  Diagnosis Date  . Breast cancer ([place]) 2017   right breast  . Breast cancer of lower-inner quadrant of right female breast ([place]) 08/08/2015  . Colon polyps   . DDD (degenerative disc disease), cervical   . Degenerative joint disease   . Diabetes mellitus without complication ([place])   . Diverticulosis   . Gallstones   . GERD (gastroesophageal reflux disease)   . Headache(784.0)    migraine like per patient  . Hypertension   . Low back pain    dr phillips, pain management  . Osteoarthritis   . Personal history of chemotherapy 2017  . Personal history of radiation therapy 2017    Past Surgical History:  Procedure Laterality Date  . CHOLECYSTECTOMY  10/28/2011   Procedure: LAPAROSCOPIC CHOLECYSTECTOMY WITH INTRAOPERATIVE CHOLANGIOGRAM;  Surgeon: Earnstine Regal, MD;  Location: [facility];  Service: General;  Laterality: N/A;  . COLONOSCOPY WITH ESOPHAGOGASTRODUODENOSCOPY (EGD)  02/03/2013   with Propofol  . EXAM UNDER ANESTHESIA WITH MANIPULATION OF KNEE Right 05/30/2003  . EXAM UNDER ANESTHESIA WITH MANIPULATION OF KNEE Left 12/27/2002  . GANGLION CYST EXCISION Right    right wrist  . KNEE ARTHROSCOPY Bilateral 01/02/2000  . KNEE ARTHROSCOPY Right   .  LAPAROSCOPIC VAGINAL HYSTERECTOMY WITH SALPINGO OOPHORECTOMY Bilateral 07/13/2006  . LIPOMA EXCISION Right 02/21/2008   hip  . MASS EXCISION  08/24/2012   Procedure: EXCISION MASS;  Surgeon: Earnstine Regal, MD;  Location: [facility];  Service: General;  Laterality: Left;  excisie soft tissue masses left shoulder(back) & left upper arm  . MASS EXCISION Left 01/24/2014   Procedure: EXCISION SOFT TISSUE MASSES LOWER LEFT BACK;  Surgeon: Earnstine Regal, MD;  Location: [facility];  Service: General;  Laterality: Left;  Marland Kitchen MASS EXCISION Right 04/30/2016   Procedure: EXCISION OF SKIN RIGHT CHEST WALL;  Surgeon: Autumn Messing III, MD;  Location: [facility];  Service: General;  Laterality: Right;  EXCISION OF SKIN RIGHT CHEST WALL  . MASTECTOMY Right 2017  . MASTECTOMY W/ SENTINEL NODE BIOPSY Right 10/01/2015   Procedure: RIGHT MASTECTOMY WITH SENTINEL LYMPH NODE BIOPSY;  Surgeon: Autumn Messing III, MD;  Location: [facility];  Service: General;  Laterality: Right;  . PORT-A-CATH REMOVAL Left 04/30/2016   Procedure: REMOVAL PORT-A-CATH;  Surgeon: Autumn Messing III, MD;  Location: [facility];  Service: General;  Laterality: Left;  REMOVAL PORT-A-CATH  . PORTACATH PLACEMENT Left 11/01/2015   Procedure: INSERTION PORT-A-CATH;  Surgeon: Autumn Messing III, MD;  Location: [facility];  Service: General;  Laterality: Left;  . REPLACEMENT UNICONDYLAR JOINT KNEE Left 04/20/2001  .  REVISION TOTAL KNEE ARTHROPLASTY Right 06/18/2004  . REVISION TOTAL KNEE ARTHROPLASTY Left 11/15/2002  . REVISION TOTAL KNEE ARTHROPLASTY Left 10/12/2001  . REVISION TOTAL KNEE ARTHROPLASTY Right 07/09/2015  . TONSILLECTOMY    . TOTAL KNEE ARTHROPLASTY Right 04/25/2003  . TOTAL KNEE ARTHROPLASTY Left 06/25/2009    There were no vitals filed for this visit.  Subjective Assessment - 10/18/18 1433    Subjective  My left hand is hurting and swollen.  I don't know why.      Pertinent History   breast cancer with right  mastectomy with 20 lymph nodes removed in january 2017, chemo with peripheral neuropathy  and  had to have more skin removed before radiation radiation with skin disruption which was concluded last of October.  Past history includes multiple levels of degenerative disc disease in neck and spine and 4 knee surgeries on each knee with the last one in October 2016. Pt had back surgery in October 2019 - rods placed pt has to wear back brace - no bending, no lifting over 5 lbs    Patient Stated Goals  get arm back to where she can reach higher    Currently in Pain?  Yes   Lt hand 5/10   Pain Score  2     Pain Location  Arm    Pain Orientation  Right    Pain Descriptors / Indicators  Sharp    Pain Type  Acute pain    Pain Onset  More than a month ago    Pain Frequency  Constant         [facility] PT Assessment - 10/18/18 0001      Observation/Other Assessments   Observations  appearance of increased Lt hand edema mild but with pt having trouble making a fist and tenderness at the 5th and 5th carpals.  No swelling or tenderness upper arm.  Pt advised to let MD know if the it remains or worsens and to go to ER if the swelling seems to present proximally                    [facility] Adult PT Treatment/Exercise - 10/18/18 0001      Shoulder Exercises: Supine   Other Supine Exercises  supine flexion AROM x 10       Shoulder Exercises: Pulleys   Flexion  2 minutes    Other Pulley Exercises  had to stop pulleys due to Lt hand pain      Shoulder Exercises: Therapy Ball   Flexion  10 reps    Other Therapy Ball Exercises  no abductiondue to wrist pain      Manual Therapy   Passive ROM  to the Rt shoulder to tolerance all directions.  MLD from Rt axilla to Rt inguinals during flexion holds                  PT Long Term Goals - 10/07/18 1303      PT LONG TERM GOAL #1   Title  Pt will demonstrate 145 degrees of right shoulder flexion to allow her to  reach up in to cabinets    Baseline  107, 10/07/18- 140    Time  4    Period  Weeks    Status  On-going      PT LONG TERM GOAL #2   Title  Pt will demonstrate 145 degrees of right shoulder abduction to allow pt to reach out to side  Baseline  95, 10/07/18- 140     Time  4    Period  Weeks    Status  On-going      PT LONG TERM GOAL #3   Title  Pt will report a 50% decrease in tightness in right axilla with right shoulder movement    Baseline  10/07/18- 30%     Time  4    Period  Weeks    Status  On-going      PT LONG TERM GOAL #4   Title  Pt to be independent in a home exercise program for continued strengthening and stretching    Time  4    Period  Weeks    Status  On-going      PT LONG TERM GOAL #5   Title  Pt to report a 50% improvement in right lateral trunk lymphedema to allow improved comfort    Baseline  10/07/18- 40%    Time  4    Period  Weeks    Status  On-going            Plan - 10/18/18 1514    Clinical Impression Statement  Had to modify treatment today wiht pt having significant Lt hand pain.  No known reason per pt.  May be slightly swollen but hard to compare to the Rt UE which has some lymphedema per pt.  Pt aware to let MD know and go to ER if swelling presents proximally.  Focus on manual ROM and MLD.STM to lateral side body in supine     PT Frequency  2x / week    PT Duration  4 weeks    PT Treatment/Interventions  ADLs/Self Care Home Management;Patient/family education;Manual techniques;Scar mobilization;Passive range of motion;Therapeutic activities;Therapeutic exercise;Manual lymph drainage;Taping;Vasopneumatic Device;Compression bandaging;Joint Manipulations;Iontophoresis 22m/ml Dexamethasone    PT Next Visit Plan  how is left hand pain? continue gentle PROM to right shoulder,with MLD and manual work as indicated  ensure pt log rolls to get up and down from mat table, pulleys, ball       Patient will benefit from skilled therapeutic intervention  in order to improve the following deficits and impairments:  Decreased knowledge of precautions, Decreased scar mobility, Decreased strength, Pain, Impaired UE functional use, Increased fascial restricitons, Postural dysfunction, Decreased range of motion, Increased edema  Visit Diagnosis: Stiffness of right shoulder, not elsewhere classified  Abnormal posture  Postmastectomy lymphedema  Muscle weakness (generalized)  Acute pain of right shoulder     Problem List Patient Active Problem List   Diagnosis Date Noted  . DDD (degenerative disc disease), lumbar 07/16/2018    Class: Chronic  . Other spondylosis with radiculopathy, lumbar region 07/16/2018    Class: Chronic  . S/P lumbar fusion 07/13/2018  . Tenosynovitis, de Quervain 08/10/2017  . 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 Insomnia 07/16/2016  . Left groin pain 11/30/2015  . Severe obesity (BMI >= 40) ([facility] 10/26/2015  . Malignant neoplasm of lower-inner quadrant of right breast of female, estrogen receptor positive ([facility] 08/08/2015  . S/P revision of total knee 07/24/2015  . Acute postoperative pain 07/11/2015  . Loose total knee arthroplasty ([facility] 07/02/2015  . Type 2 diabetes mellitus without complications ([facility] 041/32/4401 . Adhesive capsulitis of left shoulder 10/11/2014  . Paraspinous mass, left 03/31/2013  . Other symptoms and signs involving the nervous system 03/31/2013  . Internal hemorrhoid 02/17/2013  . Abdominal pain, other specified site 02/17/2013  . Neoplasm of soft tissue, left shoulder and left upper  arm 08/03/2012  . Lightheadedness 01/29/2012  . Cholelithiasis with cholecystitis 10/08/2011  . Hypokalemia 09/17/2011  . Gastro-esophageal reflux disease without esophagitis 08/08/2010  . Gout 12/18/2008  . Essential (primary) hypertension 03/15/2007  . OA (osteoarthritis) of knee 03/15/2007  . HEADACHE 03/15/2007    Shan Levans, PT 10/18/2018, 3:17 PM  [place], [place], [address] Phone: [phone]   Fax:  [phone]  Name: [name] MRN: [id number] Date of Birth: 10/14/1960   PHYSICAL THERAPY DISCHARGE SUMMARY  Visits from Start of Care: 6  Current functional level related to goals / functional outcomes: Pt did return after last visit and being sick with the flu.      Plan: Patient agrees to discharge.  Patient goals were partially met. Patient is being discharged due to not returning since the last visit.  ?????      Shan Levans, PT 12/06/18

## 2018-10-20 ENCOUNTER — Encounter: Payer: 59 | Admitting: Physical Therapy

## 2018-10-22 ENCOUNTER — Other Ambulatory Visit: Payer: Self-pay

## 2018-10-22 DIAGNOSIS — Z170 Estrogen receptor positive status [ER+]: Principal | ICD-10-CM

## 2018-10-22 DIAGNOSIS — C50311 Malignant neoplasm of lower-inner quadrant of right female breast: Secondary | ICD-10-CM

## 2018-10-24 NOTE — Progress Notes (Signed)
[facility]  Telephone:([phone]) [phone] Fax:([phone]) [phone]   ID: [name] DOB: October 12, 1960  MR#: [id number]  GYF#:[id number]  Patient Care Team: Laurey Morale, MD as PCP - General Jovita Kussmaul, MD as Consulting Physician (General Surgery) [name], Virgie Dad, MD as Consulting Physician (Oncology) Tyler Pita, MD as Consulting Physician (Radiation Oncology) Nicholaus Bloom, MD (Anesthesiology) Delice Bison, Charlestine Massed, NP as Nurse Practitioner (Hematology and Oncology) Diane Oto, MD as Consulting Physician (Orthopedic Surgery) PCP: Laurey Morale, MD GYN: OTHER MD: Mardi Mainland MD, Nicholaus Bloom MD  CHIEF COMPLAINT: Multicentric estrogen receptor positive breast cancer  CURRENT TREATMENT: Tamoxifen  BREAST CANCER HISTORY: From the original intake note:  [name] herself noted a change in her right breast in October and brought it to the attention of her primary care physician. Her last mammogram had been April 2010. Dr. Sarajane Jews set [name] up for bilateral diagnostic mammography with tomosynthesis and [facility] 04[facility]11/2015. This found the breast density to be category B. In the right breast there were 3 microlobulated masses in the lower inner quadrant measuring 1.9, 1.5, and 1.5 cm. These were multicentric. There were also diffuse fine pleomorphic calcifications surrounding these masses, that area spanning 11.6 cm. The masses were palpable by exam at 4:00 8 cm from the nipple and at 5:00 3 cm from the nipple. By ultrasonography there was an irregular hypoechoic mass in the right breast at 4:00 measuring 1.7 cm, 1 medial to this measuring 1.1 cm, and then the third irregular hypoechoic mass at the 5:00 position measuring 1.3 cm. The right axilla showed multiple lymph nodes with thickened cortices. The largest lymph node measured 0.9 cm.  On 08/02/2015, [name] underwent biopsy of all 3 breast masses as well as a  right axillary lymph node. 2 of the tumors were similar invasive ductal carcinomas, grade 2, estrogen receptor 95% positive, progesterone receptor 5% positive, with an MIB-1 of 10%, and HER-2 equivocal, with a signals ratio of 1.30 and the number per cell 4.0. The third tumor appeared's grade 1 or 2 was also estrogen receptor positive at 95%, progesterone receptor positive at 5%, with an MIP-1 of 5%, and a similar HER-2 profile the signals ratio being 1.46 but the number per cell being only 3.95. By immunohistochemistry on this tumor HER-2 was negative at 1+. Immunohistochemistry on the equivocal reading is pending.  [name]'s subsequent history is as detailed below   INTERVAL HISTORY: [name] returns today for follow-up and treatment of her estrogen receptor positive breast cancer.    She was switched from anastrozole to tamoxifen November 2019.  She is having some hot flashes.  She had a slightly pink discharge when she first started tamoxifen, but that has since resolved.  [name]'s last bone density screening on 10/02/2016, showed a T-score of -0.4, which is considered normal. She is overdue for her biennial bone density screening.   Since her last visit here, she underwent a digital screening unilateral left mammogram with tomography on 10/18/2018 showing Breast Density Category B. There is no mammographic evidence of malignancy.   REVIEW OF SYSTEMS: Jirah has had some swelling on her right side. She ordered a compression garment. She has some arthralgia in her bilateral wrists. For exercise, she has been walking, usually about 30 minutes per day. The patient denies unusual headaches, visual changes, nausea, vomiting, or dizziness. There has been no unusual cough, phlegm production, or pleurisy. This been no change in bowel or bladder habits.  The patient denies unexplained fatigue or unexplained weight loss, bleeding, rash, or fever. A detailed review of systems was otherwise noncontributory.     PAST MEDICAL HISTORY: Past Medical History:  Diagnosis Date  . Breast cancer ([name]) 2017   right breast  . Breast cancer of lower-inner quadrant of right female breast ([name]) 08/08/2015  . Colon polyps   . DDD (degenerative disc disease), cervical   . Degenerative joint disease   . Diabetes mellitus without complication ([name])   . Diverticulosis   . Gallstones   . GERD (gastroesophageal reflux disease)   . Headache(784.0)    migraine like per patient  . Hypertension   . Low back pain    dr phillips, pain management  . Osteoarthritis   . Personal history of chemotherapy 2017  . Personal history of radiation therapy 2017    PAST SURGICAL HISTORY: Past Surgical History:  Procedure Laterality Date  . CHOLECYSTECTOMY  10/28/2011   Procedure: LAPAROSCOPIC CHOLECYSTECTOMY WITH INTRAOPERATIVE CHOLANGIOGRAM;  Surgeon: Earnstine Regal, MD;  Location: [facility];  Service: General;  Laterality: N/A;  . COLONOSCOPY WITH ESOPHAGOGASTRODUODENOSCOPY (EGD)  02/03/2013   with Propofol  . EXAM UNDER ANESTHESIA WITH MANIPULATION OF KNEE Right 05/30/2003  . EXAM UNDER ANESTHESIA WITH MANIPULATION OF KNEE Left 12/27/2002  . GANGLION CYST EXCISION Right    right wrist  . KNEE ARTHROSCOPY Bilateral 01/02/2000  . KNEE ARTHROSCOPY Right   . LAPAROSCOPIC VAGINAL HYSTERECTOMY WITH SALPINGO OOPHORECTOMY Bilateral 07/13/2006  . LIPOMA EXCISION Right 02/21/2008   hip  . MASS EXCISION  08/24/2012   Procedure: EXCISION MASS;  Surgeon: Earnstine Regal, MD;  Location: [facility];  Service: General;  Laterality: Left;  excisie soft tissue masses left shoulder(back) & left upper arm  . MASS EXCISION Left 01/24/2014   Procedure: EXCISION SOFT TISSUE MASSES LOWER LEFT BACK;  Surgeon: Earnstine Regal, MD;  Location: [facility];  Service: General;  Laterality: Left;  Marland Kitchen MASS EXCISION Right 04/30/2016   Procedure: EXCISION OF SKIN RIGHT CHEST WALL;  Surgeon: Autumn Messing III, MD;  Location: [facility];  Service: General;  Laterality: Right;  EXCISION OF SKIN RIGHT CHEST WALL  . MASTECTOMY Right 2017  . MASTECTOMY W/ SENTINEL NODE BIOPSY Right 10/01/2015   Procedure: RIGHT MASTECTOMY WITH SENTINEL LYMPH NODE BIOPSY;  Surgeon: Autumn Messing III, MD;  Location: [facility];  Service: General;  Laterality: Right;  . PORT-A-CATH REMOVAL Left 04/30/2016   Procedure: REMOVAL PORT-A-CATH;  Surgeon: Autumn Messing III, MD;  Location: [facility];  Service: General;  Laterality: Left;  REMOVAL PORT-A-CATH  . PORTACATH PLACEMENT Left 11/01/2015   Procedure: INSERTION PORT-A-CATH;  Surgeon: Autumn Messing III, MD;  Location: [facility];  Service: General;  Laterality: Left;  . REPLACEMENT UNICONDYLAR JOINT KNEE Left 04/20/2001  . REVISION TOTAL KNEE ARTHROPLASTY Right 06/18/2004  . REVISION TOTAL KNEE ARTHROPLASTY Left 11/15/2002  . REVISION TOTAL KNEE ARTHROPLASTY Left 10/12/2001  . REVISION TOTAL KNEE ARTHROPLASTY Right 07/09/2015  . TONSILLECTOMY    . TOTAL KNEE ARTHROPLASTY Right 04/25/2003  . TOTAL KNEE ARTHROPLASTY Left 06/25/2009    FAMILY HISTORY Family History  Problem Relation Age of Onset  . Stomach cancer Mother   . Heart disease Father   . Prostate cancer Father   . Kidney cancer Sister        one out of the four  . Hypertension Sister   . Hypertension Brother   . Crohn's disease Other  nephew  . Colon cancer Maternal Grandmother   . Esophageal cancer Neg Hx    Ms. Mongeau is a real long radiation since she was coming to see me today if she wanted to she could drop by downstairs would see her and she would like to review them does let them know that she is originally The patient's father died at the age of 37 with congestive heart failure. Her mother is still living as of 02/032020, age 79. The patient had 6 brothers, 4 sisters. One sister was diagnosed with kidney cancer at age 38. ([place]). She is doing well. The patient's maternal grandmother was  diagnosed with colon cancer at the age of 25. The patient's mother was diagnosed with a "rare stomach cancer" 20 years ago. The patient's father was diagnosed with prostate cancer at age 52. There is no history of breast or ovarian cancer in the family to her knowledge.    GYNECOLOGIC HISTORY:  No LMP recorded. Patient has had a hysterectomy.  menarche age 45, first live birth age 48, the patient is GX P3. She underwent remote hysterectomy with bilateral salpingo-oophorectomy. She did not use hormone replacement. She never used oral contraceptives.    SOCIAL HISTORY:  Leandra used to work at [employer] but she is now disabled because of her multiple arthritic and degenerative problems. Her husband Elberta Fortis A. Sardo works as a Freight forwarder. Son Dalphine Handing works in long care in [place]. Son Evette Doffing works for a Arboriculturist in [place]. Daughter  Silva Bandy is a Haematologist in [place]. The patient has 5 grandchildren. She attends a Physicist, medical church    ADVANCED DIRECTIVES: Not in place   HEALTH MAINTENANCE: Social History   Tobacco Use  . Smoking status: Current Every Day Smoker    Packs/day: 0.25    Years: 20.00    Pack years: 5.00    Types: Cigarettes  . Smokeless tobacco: Former Systems developer    Quit date: 09/30/2015  . Tobacco comment: smokes about 4 cigs/day; working on quitting - 08/22/16 gwd  Substance Use Topics  . Alcohol use: No    Alcohol/week: 0.0 standard drinks  . Drug use: No     Colonoscopy:February 2016   ERD:EYCXKG post hysterectomy   Bone density:  Lipid panel:  Allergies  Allergen Reactions  . Codeine Itching, Nausea And Vomiting and Other (See Comments)    Itching all over the body  . Latex Hives  . Sulfonamide Derivatives Hives and Other (See Comments)    All over the body  . Gabapentin Other (See Comments)    headache  . Lyrica [Pregabalin] Other (See Comments)    headache    Current Outpatient Medications    Medication Sig Dispense Refill  . amitriptyline (ELAVIL) 25 MG tablet Take 50 mg by mouth at bedtime.     Marland Kitchen amLODipine (NORVASC) 10 MG tablet Take 1 tablet (10 mg total) by mouth daily. 30 tablet 3  . KLOR-CON M20 20 MEQ tablet Take 20 mEq by mouth daily.     Marland Kitchen lisinopril (PRINIVIL,ZESTRIL) 10 MG tablet Take 2 tablets (20 mg total) by mouth once for 1 dose. (Patient taking differently: Take 10 mg by mouth daily. ) 180 tablet 3  . metFORMIN (GLUCOPHAGE) 500 MG tablet TAKE 1 TABLET BY MOUTH TWICE A DAY WITH A MEAL (Patient taking differently: Take 500 mg by mouth daily with breakfast. ) 60 tablet 8  . metoprolol tartrate (LOPRESSOR) 50 MG tablet TAKE 1 TABLET BY MOUTH  TWICE A DAY 60 tablet 5  . omeprazole (PRILOSEC) 40 MG capsule TAKE 1 CAPSULE BY MOUTH EVERY DAY (Patient taking differently: Take 40 mg by mouth daily. ) 30 capsule 5  . ONE TOUCH ULTRA TEST test strip TEST ONCE DAILY 25 each 3  . ONETOUCH DELICA LANCETS FINE MISC USE TO TEST ONCE DAILY DX CODE E11.9 100 each 0  . pregabalin (LYRICA) 75 MG capsule Take 1 capsule (75 mg total) by mouth 2 (two) times daily. 60 capsule 0   No current facility-administered medications for this visit.     OBJECTIVE: Middle-aged African-American woman in no acute distress Vitals:   10/25/18 0948  BP: (!) 156/84  Pulse: 71  Resp: 18  Temp: 98.4 F (36.9 C)  SpO2: 98%     Body mass index is 39.47 kg/m.    ECOG FS:1 - Symptomatic but completely ambulatory   Sclerae unicteric, pupils round and equal Oropharynx clear, full upper plate No cervical or supraclavicular adenopathy Lungs no rales or rhonchi Heart regular rate and rhythm Abd soft, obese, nontender, positive bowel sounds MSK no focal spinal tenderness, grade 1 right upper extremity lymphedema Neuro: nonfocal, well oriented, appropriate affect Breasts: Status post right mastectomy and right chest wall radiation.  There is some tenderness to exam which is not new and there is still some  hyperpigmentation.  There is no evidence of local recurrence.  The left breast is unremarkable.  Both axillae are benign.  LAB RESULTS:  CMP     Component Value Date/Time   NA 142 08/02/2018 1438   NA 140 07/30/2017 1132   K 3.3 (L) 08/02/2018 1438   K 3.3 (L) 07/30/2017 1132   CL 102 08/02/2018 1438   CO2 28 08/02/2018 1438   CO2 23 07/30/2017 1132   GLUCOSE 110 (H) 08/02/2018 1438   GLUCOSE 116 07/30/2017 1132   BUN 14 08/02/2018 1438   BUN 15.6 07/30/2017 1132   CREATININE 1.03 (H) 08/02/2018 1438   CREATININE 0.9 07/30/2017 1132   CALCIUM 9.7 08/02/2018 1438   CALCIUM 9.6 07/30/2017 1132   PROT 7.5 08/02/2018 1438   PROT 7.4 07/30/2017 1132   ALBUMIN 4.0 08/02/2018 1438   ALBUMIN 4.1 07/30/2017 1132   AST 11 (L) 08/02/2018 1438   AST 17 07/30/2017 1132   ALT 8 08/02/2018 1438   ALT 13 07/30/2017 1132   ALKPHOS 83 08/02/2018 1438   ALKPHOS 76 07/30/2017 1132   BILITOT <0.2 (L) 08/02/2018 1438   BILITOT 0.25 07/30/2017 1132   GFRNONAA 59 (L) 08/02/2018 1438   GFRAA >60 08/02/2018 1438    INo results found for: SPEP, UPEP  Lab Results  Component Value Date   WBC 6.6 10/25/2018   NEUTROABS 4.3 10/25/2018   HGB 12.1 10/25/2018   HCT 37.8 10/25/2018   MCV 88.5 10/25/2018   PLT 263 10/25/2018      Chemistry      Component Value Date/Time   NA 142 08/02/2018 1438   NA 140 07/30/2017 1132   K 3.3 (L) 08/02/2018 1438   K 3.3 (L) 07/30/2017 1132   CL 102 08/02/2018 1438   CO2 28 08/02/2018 1438   CO2 23 07/30/2017 1132   BUN 14 08/02/2018 1438   BUN 15.6 07/30/2017 1132   CREATININE 1.03 (H) 08/02/2018 1438   CREATININE 0.9 07/30/2017 1132      Component Value Date/Time   CALCIUM 9.7 08/02/2018 1438   CALCIUM 9.6 07/30/2017 1132   ALKPHOS 83 08/02/2018 1438  ALKPHOS 76 07/30/2017 1132   AST 11 (L) 08/02/2018 1438   AST 17 07/30/2017 1132   ALT 8 08/02/2018 1438   ALT 13 07/30/2017 1132   BILITOT <0.2 (L) 08/02/2018 1438   BILITOT 0.25 07/30/2017  1132       No results found for: LABCA2  No components found for: [id number]  No results for input(s): INR in the last 168 hours.  Urinalysis    Component Value Date/Time   COLORURINE YELLOW 05/07/2018 1628   APPEARANCEUR CLEAR 05/07/2018 1628   LABSPEC 1.023 05/07/2018 1628   LABSPEC 1.010 02/19/2016 1355   PHURINE 5.0 05/07/2018 1628   GLUCOSEU NEGATIVE 05/07/2018 1628   GLUCOSEU Negative 02/19/2016 1355   HGBUR SMALL (A) 05/07/2018 1628   HGBUR large 07/01/2010 0945   BILIRUBINUR NEGATIVE 05/07/2018 1628   BILIRUBINUR neg 10/14/2017 1010   BILIRUBINUR Negative 02/19/2016 1355   KETONESUR NEGATIVE 05/07/2018 1628   PROTEINUR NEGATIVE 05/07/2018 1628   UROBILINOGEN 0.2 10/14/2017 1010   UROBILINOGEN 0.2 02/19/2016 1355   NITRITE NEGATIVE 05/07/2018 1628   LEUKOCYTESUR NEGATIVE 05/07/2018 1628   LEUKOCYTESUR Small 02/19/2016 1355    STUDIES: Mm 3d Screen Breast Uni Left  Result Date: 10/18/2018 CLINICAL DATA:  Screening. EXAM: DIGITAL SCREENING UNILATERAL LEFT MAMMOGRAM WITH CAD AND TOMO COMPARISON:  Previous exam(s). ACR Breast Density Category b: There are scattered areas of fibroglandular density. FINDINGS: There are no findings suspicious for malignancy. Images were processed with CAD. IMPRESSION: No mammographic evidence of malignancy. A result letter of this screening mammogram will be mailed directly to the patient. RECOMMENDATION: Screening mammogram in one year. (Code:[id number]-B-01Y) BI-RADS CATEGORY  1: Negative. Electronically Signed   By: Lajean Manes M.D.   On: 10/18/2018 13:29    ASSESSMENT: 58 y.o. [place] woman status post right breast lower inner quadrant biopsy 3 and axillary lymph node biopsy 08/02/2015 for a clinical mpT1c N0, stage IA invasive ductal carcinoma, grade 1 or 2, estrogen receptor 95% positive, progesterone receptor 5% positive, with an MIB-1 between 5 and 10%, and HER-2 equivocal on 1 of the 2 biopsies (signals ratio 1.30, number per cell  4.0).  (1) status post right modified radical mastectomy 10/01/2015 for an mpT2 pN0, stage IIA invasive ductal carcinoma, grade 2, with a total of 20 benign lymph nodes removed  (2) Oncotype DX score of 32 predicts an outside the breast recurrence risk of 22% within 10 years if the patient's only systemic therapy is tamoxifen for 5 years. It also predicts significant benefit from adjuvant chemotherapy.  (3) patient completed adjuvant doxorubicin and cyclophosphamide in dose dense fashion 4, last dose 12/24/2015, followed by paclitaxel weekly starting 01/07/2016  (a) completed 10 of 12 planned paclitaxel doses, final 2 doses omitted because of neuropathy concerns    (4) postmastectomy radiation 06/02/16-07/17/16 1.  The right chest wall and supraclavicular region was treated to 50.4 Gy in 28 fractions of 1.8 Gy 2.  The mastectomy site was boosted to 60.4 Gy with 5 fractions of 2 Gy  (5) started anastrozole 08/22/2016, switched to tamoxifen 08/10/2018  (a) bone density 10/02/2016 shows a T score of -0.4, in the normal range.    PLAN: Chanetta is now 3 years out from definitive surgery for her breast cancer with no evidence of disease recurrence.  This is very favorable.  She is tolerating tamoxifen with no unusual side effects.  The plan will be to continue that through December 2022  She benefited from physical therapy and she tells me she has  a compression "machine" that will be delivered to her home in the near future, which hopefully will help even more  I have encouraged her to continue her walking program and gave her information on the tai chi available here on Wednesday mornings  Otherwise she will return to see me in 1 year.  Since she is now on tamoxifen and her last bone density was normal I do not think it is urgent to have a repeat bone density at that time.    She knows to call for any other issue that may develop before that visit.  [name], Virgie Dad, MD  10/25/18 10:03  AM Medical Oncology and Hematology [facility] [address] [place], [place] [address] Tel. [phone]    Fax. [phone]  I, Jacqualyn Posey am acting as a Education administrator for Chauncey Cruel, MD.   I, Lurline Del MD, have reviewed the above documentation for accuracy and completeness, and I agree with the above.

## 2018-10-25 ENCOUNTER — Inpatient Hospital Stay: Payer: 59 | Attending: Oncology

## 2018-10-25 ENCOUNTER — Inpatient Hospital Stay (HOSPITAL_BASED_OUTPATIENT_CLINIC_OR_DEPARTMENT_OTHER): Payer: 59 | Admitting: Oncology

## 2018-10-25 VITALS — BP 156/84 | HR 71 | Temp 98.4°F | Resp 18 | Ht 67.0 in | Wt 252.0 lb

## 2018-10-25 DIAGNOSIS — Z170 Estrogen receptor positive status [ER+]: Secondary | ICD-10-CM | POA: Diagnosis not present

## 2018-10-25 DIAGNOSIS — E119 Type 2 diabetes mellitus without complications: Secondary | ICD-10-CM | POA: Insufficient documentation

## 2018-10-25 DIAGNOSIS — Z79810 Long term (current) use of selective estrogen receptor modulators (SERMs): Secondary | ICD-10-CM

## 2018-10-25 DIAGNOSIS — Z9221 Personal history of antineoplastic chemotherapy: Secondary | ICD-10-CM

## 2018-10-25 DIAGNOSIS — I10 Essential (primary) hypertension: Secondary | ICD-10-CM | POA: Diagnosis not present

## 2018-10-25 DIAGNOSIS — Z7984 Long term (current) use of oral hypoglycemic drugs: Secondary | ICD-10-CM | POA: Insufficient documentation

## 2018-10-25 DIAGNOSIS — Z9011 Acquired absence of right breast and nipple: Secondary | ICD-10-CM

## 2018-10-25 DIAGNOSIS — C50311 Malignant neoplasm of lower-inner quadrant of right female breast: Secondary | ICD-10-CM

## 2018-10-25 DIAGNOSIS — Z79899 Other long term (current) drug therapy: Secondary | ICD-10-CM | POA: Insufficient documentation

## 2018-10-25 DIAGNOSIS — F17210 Nicotine dependence, cigarettes, uncomplicated: Secondary | ICD-10-CM | POA: Diagnosis not present

## 2018-10-25 DIAGNOSIS — Z923 Personal history of irradiation: Secondary | ICD-10-CM

## 2018-10-25 LAB — CMP (CANCER CENTER ONLY)
ALT: 10 U/L (ref 0–44)
AST: 16 U/L (ref 15–41)
Albumin: 4 g/dL (ref 3.5–5.0)
Alkaline Phosphatase: 82 U/L (ref 38–126)
Anion gap: 11 (ref 5–15)
BUN: 12 mg/dL (ref 6–20)
CO2: 29 mmol/L (ref 22–32)
Calcium: 9.2 mg/dL (ref 8.9–10.3)
Chloride: 103 mmol/L (ref 98–111)
Creatinine: 0.85 mg/dL (ref 0.44–1.00)
GFR, Est AFR Am: >60 mL/min (ref >60)
GFR, Estimated: >60 mL/min (ref >60)
Glucose, Bld: 138 mg/dL — ABNORMAL HIGH (ref 70–99)
Potassium: 3.5 mmol/L (ref 3.5–5.1)
Sodium: 143 mmol/L (ref 135–145)
Total Bilirubin: 0.3 mg/dL (ref 0.3–1.2)
Total Protein: 7.1 g/dL (ref 6.5–8.1)

## 2018-10-25 LAB — CBC WITH DIFFERENTIAL (CANCER CENTER ONLY)
Abs Immature Granulocytes: 0.01 10*3/uL (ref 0.00–0.07)
Basophils Absolute: 0 10*3/uL (ref 0.0–0.1)
Basophils Relative: 0 %
Eosinophils Absolute: 0.1 10*3/uL (ref 0.0–0.5)
Eosinophils Relative: 1 %
HCT: 37.8 % (ref 36.0–46.0)
Hemoglobin: 12.1 g/dL (ref 12.0–15.0)
Immature Granulocytes: 0 %
Lymphocytes Relative: 30 %
Lymphs Abs: 2 10*3/uL (ref 0.7–4.0)
MCH: 28.3 pg (ref 26.0–34.0)
MCHC: 32 g/dL (ref 30.0–36.0)
MCV: 88.5 fL (ref 80.0–100.0)
Monocytes Absolute: 0.3 10*3/uL (ref 0.1–1.0)
Monocytes Relative: 5 %
Neutro Abs: 4.3 10*3/uL (ref 1.7–7.7)
Neutrophils Relative %: 64 %
Platelet Count: 263 10*3/uL (ref 150–400)
RBC: 4.27 MIL/uL (ref 3.87–5.11)
RDW: 14.6 % (ref 11.5–15.5)
WBC Count: 6.6 10*3/uL (ref 4.0–10.5)
nRBC: 0 % (ref 0.0–0.2)

## 2018-10-25 MED ORDER — TAMOXIFEN CITRATE 20 MG PO TABS: 20.0000 mg | ORAL_TABLET | Freq: Every day | ORAL | 12 refills | Status: AC

## 2018-10-26 ENCOUNTER — Encounter: Payer: 59 | Admitting: Physical Therapy

## 2018-10-26 ENCOUNTER — Encounter: Payer: Self-pay | Admitting: Family Medicine

## 2018-10-26 ENCOUNTER — Ambulatory Visit: Payer: 59 | Admitting: Family Medicine

## 2018-10-26 ENCOUNTER — Telehealth: Payer: Self-pay

## 2018-10-26 VITALS — BP 150/72 | HR 104 | Temp 100.8°F | Wt 248.4 lb

## 2018-10-26 DIAGNOSIS — R509 Fever, unspecified: Secondary | ICD-10-CM | POA: Diagnosis not present

## 2018-10-26 DIAGNOSIS — J101 Influenza due to other identified influenza virus with other respiratory manifestations: Secondary | ICD-10-CM

## 2018-10-26 LAB — POCT INFLUENZA A/B
Influenza A, POC: POSITIVE — AB
Influenza B, POC: NEGATIVE

## 2018-10-26 MED ORDER — BENZONATATE 200 MG PO CAPS: 200.0000 mg | ORAL_CAPSULE | Freq: Two times a day (BID) | ORAL | 1 refills | Status: DC | PRN

## 2018-10-26 MED ORDER — OSELTAMIVIR PHOSPHATE 75 MG PO CAPS: 75.0000 mg | ORAL_CAPSULE | Freq: Two times a day (BID) | ORAL | 0 refills | Status: DC

## 2018-10-26 MED ORDER — HYDROCODONE-HOMATROPINE 5-1.5 MG/5ML PO SYRP: 5.0000 mL | ORAL_SOLUTION | ORAL | 0 refills | Status: DC | PRN

## 2018-10-26 NOTE — Progress Notes (Signed)
   Subjective:    Patient ID: [name], female    DOB: 1961-07-31, 58 y.o.   MRN: [id number]  HPI Here for the onset yesterday of fever, body aches, diarrhea, and a dry cough. On Coricidin.   Review of Systems  Constitutional: Positive for fever.  HENT: Negative for postnasal drip, sinus pressure, sinus pain and sore throat.   Eyes: Negative.   Respiratory: Positive for cough.   Cardiovascular: Negative.   Gastrointestinal: Positive for diarrhea. Negative for abdominal distention, abdominal pain and nausea.       Objective:   Physical Exam Constitutional:      Appearance: She is ill-appearing.  HENT:     Right Ear: Tympanic membrane and ear canal normal.     Left Ear: Tympanic membrane and ear canal normal.     Nose: Nose normal.     Mouth/Throat:     Pharynx: Oropharynx is clear.  Eyes:     Conjunctiva/sclera: Conjunctivae normal.  Pulmonary:     Effort: Pulmonary effort is normal. No respiratory distress.     Breath sounds: Normal breath sounds. No stridor. No wheezing, rhonchi or rales.  Lymphadenopathy:     Cervical: No cervical adenopathy.  Neurological:     Mental Status: She is alert.           Assessment & Plan:  Influenza A. Treat with Tamiflu.  Alysia Penna, MD

## 2018-10-26 NOTE — Telephone Encounter (Signed)
Alex @ [facility] called to advise pt is picking up Methadone 10mg , 1tab q6h, #120 and Oxycodone 15mg , 1tab q6h, #120 while also trying to pick up the Hydromet cough syrup. He wanted to confirm with Dr. Sarajane Jews that he is aware pt is going to be taking all these medications.  Spoke with Dr. Sarajane Jews - He was not aware pt was on Methadone and Oxy. He would like to cancel rx for Hydromet and replace with benzonatate 200mg , 1 capsule BID prn cough, #30 with 1 refill.   Called pharmacy and spoke with [facility]. She canceled rx for Hydromet and replaced with benzonatate. Nothing further needed.

## 2018-10-28 ENCOUNTER — Encounter: Payer: 59 | Admitting: Physical Therapy

## 2018-11-01 ENCOUNTER — Encounter: Payer: 59 | Admitting: Rehabilitation

## 2018-11-03 ENCOUNTER — Telehealth: Payer: Self-pay | Admitting: Rehabilitation

## 2018-11-03 ENCOUNTER — Encounter: Payer: 59 | Admitting: Rehabilitation

## 2018-11-04 ENCOUNTER — Encounter (INDEPENDENT_AMBULATORY_CARE_PROVIDER_SITE_OTHER): Payer: Self-pay | Admitting: Specialist

## 2018-11-04 ENCOUNTER — Ambulatory Visit (INDEPENDENT_AMBULATORY_CARE_PROVIDER_SITE_OTHER): Payer: 59

## 2018-11-04 ENCOUNTER — Ambulatory Visit (INDEPENDENT_AMBULATORY_CARE_PROVIDER_SITE_OTHER): Payer: 59 | Admitting: Specialist

## 2018-11-04 VITALS — BP 146/85 | HR 64 | Ht 67.0 in | Wt 253.0 lb

## 2018-11-04 DIAGNOSIS — Z981 Arthrodesis status: Secondary | ICD-10-CM | POA: Diagnosis not present

## 2018-11-04 NOTE — Patient Instructions (Signed)
Avoid frequent bending and stooping  No lifting greater than 10 lbs. May use ice or moist heat for pain. Weight loss is of benefit. Best medication for lumbar disc disease is arthritis medications like motrin, celebrex and naprosyn. Exercise is important to improve your indurance and does allow people to function better inspite of back pain.   

## 2018-11-04 NOTE — Progress Notes (Signed)
Office Visit Note   Patient: [name]           Date of Birth: [birth year]           MRN: [id number] Visit Date: 11/04/2018              Requested by: Laurey Morale, MD [place], [place] [id number] PCP: Laurey Morale, MD   Assessment & Plan: Visit Diagnoses:  1. S/P lumbar fusion     Plan:Avoid frequent bending and stooping  No lifting greater than 10 lbs. May use ice or moist heat for pain. Weight loss is of benefit. Best medication for lumbar disc disease is arthritis medications like motrin, celebrex and naprosyn. Exercise is important to improve your indurance and does allow people to function better inspite of back pain.  Follow-Up Instructions: Return in about 3 months (around 02/02/2019).   Orders:  Orders Placed This Encounter  Procedures  . XR Lumbar Spine 2-3 Views   No orders of the defined types were placed in this encounter.     Procedures: No procedures performed   Clinical Data: No additional findings.   Subjective: Chief Complaint  Patient presents with  . Lower Back - Follow-up    16 wk 2 days post----TRANSFORAMINAL LUMBAR INTERBODY FUSION RIGHT L4-5 AND RIGHT L5-S1 WITH SCREWS, RODS AND CAGES, LOCAL AND ALLOGRAFT BONE GRAFT AND VIVIGEN, BILATERAL DECOMPRESSIVE LAMINECTOMIES L4-5 AND L3-S29     58 year old female with history of L4-5 and L5-S1 fusion in 06/2018 and she has been experiencing numbness and tingling in to the left groin and left leg greater than right  Since the surgery. She experiences night pain that keeps her awake at night . She is taking the same meds, methadone and oxycodone, prescribed by Dr. Nicholaus Bloom. The pain in the back is  Better but the leg discomfort is still there. No bowel or bladder difficulty. Cough or sneeze is not  Painful. Bending and stooping is painful. Sitting long periods with dyscomfort and with in and out of the car. It feels better one hundred precent. She has been able to cut  back her baseline meds.    Review of Systems  Constitutional: Negative.   HENT: Negative.   Eyes: Negative.   Respiratory: Negative.   Cardiovascular: Negative.   Gastrointestinal: Negative.   Endocrine: Negative.   Genitourinary: Negative.   Musculoskeletal: Negative.   Skin: Negative.   Allergic/Immunologic: Negative.   Neurological: Negative.   Hematological: Negative.   Psychiatric/Behavioral: Negative.      Objective: Vital Signs: BP (!) 146/85 (BP Location: Left Arm, Patient Position: Sitting)   Pulse 64   Ht 5\' 7"  (1.702 m)   Wt 253 lb (114.8 kg)   BMI 39.63 kg/m   Physical Exam Constitutional:      Appearance: She is well-developed.  HENT:     Head: Normocephalic and atraumatic.  Eyes:     Pupils: Pupils are equal, round, and reactive to light.  Neck:     Musculoskeletal: Normal range of motion and neck supple.  Pulmonary:     Effort: Pulmonary effort is normal.     Breath sounds: Normal breath sounds.  Abdominal:     General: Bowel sounds are normal.     Palpations: Abdomen is soft.  Musculoskeletal: Normal range of motion.  Skin:    General: Skin is warm and dry.  Neurological:     Mental Status: She is alert and oriented to person,  place, and time.  Psychiatric:        Behavior: Behavior normal.        Thought Content: Thought content normal.        Judgment: Judgment normal.     Ortho Exam  Specialty Comments:  No specialty comments available.  Imaging: Xr Lumbar Spine 2-3 Views  Result Date: 11/04/2018 AP and lateral flexion and extension radiographs with less than 1 mm of motion at the anterior lumbar spine measured at similar points. Hardware and interbody cages in good position and alignment . Fusion L4 to S1 appears solid.     PMFS History: Patient Active Problem List   Diagnosis Date Noted  . DDD (degenerative disc disease), lumbar 07/16/2018    Priority: High    Class: Chronic  . Other spondylosis with radiculopathy, lumbar  region 07/16/2018    Priority: High    Class: Chronic  . S/P lumbar fusion 07/13/2018  . Tenosynovitis, de Quervain 08/10/2017  . 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 Insomnia 07/16/2016  . Left groin pain 11/30/2015  . Severe obesity (BMI >= 40) ([place]) 10/26/2015  . Malignant neoplasm of lower-inner quadrant of right breast of female, estrogen receptor positive ([facility]) 08/08/2015  . S/P revision of total knee 07/24/2015  . Acute postoperative pain 07/11/2015  . Loose total knee arthroplasty ([facility] [facility]) 07/02/2015  . Type 2 diabetes mellitus without complications ([facility]) 16/06/9603  . Adhesive capsulitis of left shoulder 10/11/2014  . Paraspinous mass, left 03/31/2013  . Other symptoms and signs involving the nervous system 03/31/2013  . Internal hemorrhoid 02/17/2013  . Abdominal pain, other specified site 02/17/2013  . Neoplasm of soft tissue, left shoulder and left upper arm 08/03/2012  . Lightheadedness 01/29/2012  . Cholelithiasis with cholecystitis 10/08/2011  . Hypokalemia 09/17/2011  . Gastro-esophageal reflux disease without esophagitis 08/08/2010  . Gout 12/18/2008  . Essential (primary) hypertension 03/15/2007  . OA (osteoarthritis) of knee 03/15/2007  . HEADACHE 03/15/2007   Past Medical History:  Diagnosis Date  . Breast cancer ([facility]) 2017   right breast  . Breast cancer of lower-inner quadrant of right female breast ([place]) 08/08/2015  . Colon polyps   . DDD (degenerative disc disease), cervical   . Degenerative joint disease   . Diabetes mellitus without complication ([place])   . Diverticulosis   . Gallstones   . GERD (gastroesophageal reflux disease)   . Headache(784.0)    migraine like per patient  . Hypertension   . Low back pain    dr phillips, pain management  . Osteoarthritis   . Personal history of chemotherapy 2017  . Personal history of radiation therapy 2017    Family History  Problem Relation Age of Onset  . Stomach cancer Mother   . Heart disease Father   .  Prostate cancer Father   . Kidney cancer Sister        one out of the four  . Hypertension Sister   . Hypertension Brother   . Crohn's disease Other        nephew  . Colon cancer Maternal Grandmother   . Esophageal cancer Neg Hx     Past Surgical History:  Procedure Laterality Date  . CHOLECYSTECTOMY  10/28/2011   Procedure: LAPAROSCOPIC CHOLECYSTECTOMY WITH INTRAOPERATIVE CHOLANGIOGRAM;  Surgeon: Earnstine Regal, MD;  Location: [facility];  Service: General;  Laterality: N/A;  . COLONOSCOPY WITH ESOPHAGOGASTRODUODENOSCOPY (EGD)  02/03/2013   with Propofol  . EXAM UNDER ANESTHESIA WITH MANIPULATION OF KNEE Right 05/30/2003  . EXAM  UNDER ANESTHESIA WITH MANIPULATION OF KNEE Left 12/27/2002  . GANGLION CYST EXCISION Right    right wrist  . KNEE ARTHROSCOPY Bilateral 01/02/2000  . KNEE ARTHROSCOPY Right   . LAPAROSCOPIC VAGINAL HYSTERECTOMY WITH SALPINGO OOPHORECTOMY Bilateral 07/13/2006  . LIPOMA EXCISION Right 02/21/2008   hip  . MASS EXCISION  08/24/2012   Procedure: EXCISION MASS;  Surgeon: Earnstine Regal, MD;  Location: [facility];  Service: General;  Laterality: Left;  excisie soft tissue masses left shoulder(back) & left upper arm  . MASS EXCISION Left 01/24/2014   Procedure: EXCISION SOFT TISSUE MASSES LOWER LEFT BACK;  Surgeon: Earnstine Regal, MD;  Location: [facility];  Service: General;  Laterality: Left;  Marland Kitchen MASS EXCISION Right 04/30/2016   Procedure: EXCISION OF SKIN RIGHT CHEST WALL;  Surgeon: Autumn Messing III, MD;  Location: [facility];  Service: General;  Laterality: Right;  EXCISION OF SKIN RIGHT CHEST WALL  . MASTECTOMY Right 2017  . MASTECTOMY W/ SENTINEL NODE BIOPSY Right 10/01/2015   Procedure: RIGHT MASTECTOMY WITH SENTINEL LYMPH NODE BIOPSY;  Surgeon: Autumn Messing III, MD;  Location: [facility];  Service: General;  Laterality: Right;  . PORT-A-CATH REMOVAL Left 04/30/2016   Procedure: REMOVAL PORT-A-CATH;  Surgeon: Autumn Messing III, MD;  Location:  [facility];  Service: General;  Laterality: Left;  REMOVAL PORT-A-CATH  . PORTACATH PLACEMENT Left 11/01/2015   Procedure: INSERTION PORT-A-CATH;  Surgeon: Autumn Messing III, MD;  Location: [facility];  Service: General;  Laterality: Left;  . REPLACEMENT UNICONDYLAR JOINT KNEE Left 04/20/2001  . REVISION TOTAL KNEE ARTHROPLASTY Right 06/18/2004  . REVISION TOTAL KNEE ARTHROPLASTY Left 11/15/2002  . REVISION TOTAL KNEE ARTHROPLASTY Left 10/12/2001  . REVISION TOTAL KNEE ARTHROPLASTY Right 07/09/2015  . TONSILLECTOMY    . TOTAL KNEE ARTHROPLASTY Right 04/25/2003  . TOTAL KNEE ARTHROPLASTY Left 06/25/2009   Social History   Occupational History  . Occupation: retired/disabled  Tobacco Use  . Smoking status: Current Every Day Smoker    Packs/day: 0.25    Years: 20.00    Pack years: 5.00    Types: Cigarettes  . Smokeless tobacco: Former Systems developer    Quit date: 09/30/2015  . Tobacco comment: smokes about 4 cigs/day; working on quitting - 08/22/16 gwd  Substance and Sexual Activity  . Alcohol use: No    Alcohol/week: 0.0 standard drinks  . Drug use: No  . Sexual activity: Yes    Birth control/protection: Post-menopausal

## 2018-11-12 ENCOUNTER — Other Ambulatory Visit: Payer: Self-pay | Admitting: Family Medicine

## 2018-11-12 NOTE — Telephone Encounter (Signed)
Dr. Fry please advise on refill. thanks 

## 2018-11-12 NOTE — Telephone Encounter (Signed)
Call in #[phone] with 5 rf 

## 2018-11-15 NOTE — Telephone Encounter (Signed)
Refill called to the pharmacy and left on the VM 

## 2018-11-23 DIAGNOSIS — M654 Radial styloid tenosynovitis [de Quervain]: Secondary | ICD-10-CM | POA: Diagnosis not present

## 2018-11-23 DIAGNOSIS — Z79891 Long term (current) use of opiate analgesic: Secondary | ICD-10-CM | POA: Diagnosis not present

## 2018-11-23 DIAGNOSIS — M25562 Pain in left knee: Secondary | ICD-10-CM | POA: Diagnosis not present

## 2018-11-23 DIAGNOSIS — G894 Chronic pain syndrome: Secondary | ICD-10-CM | POA: Diagnosis not present

## 2018-11-23 DIAGNOSIS — M150 Primary generalized (osteo)arthritis: Secondary | ICD-10-CM | POA: Diagnosis not present

## 2018-12-19 ENCOUNTER — Other Ambulatory Visit: Payer: Self-pay | Admitting: Family Medicine

## 2018-12-23 ENCOUNTER — Telehealth: Payer: Self-pay | Admitting: Family Medicine

## 2018-12-23 NOTE — Telephone Encounter (Signed)
Called to schdule webex with pt and she stated that her husband has the email and she will have to get that information and will call us back to schedule the appt

## 2019-01-06 ENCOUNTER — Telehealth: Payer: Self-pay | Admitting: Rehabilitation

## 2019-01-06 ENCOUNTER — Encounter: Payer: Self-pay | Admitting: Rehabilitation

## 2019-01-06 ENCOUNTER — Encounter: Payer: Self-pay | Admitting: General Practice

## 2019-01-06 NOTE — Telephone Encounter (Signed)
Called [name] back to let her know that the [employer] funds would not make any difference in her total deductible amount.  Pt aware

## 2019-01-06 NOTE — Telephone Encounter (Signed)
Reached out to patient regarding cost of compression garments.  PT will contact [employer] and [employer] about assistance there. Otherwise patient will wait until her deductible is lower and purchase them then.

## 2019-01-06 NOTE — Progress Notes (Signed)
[facility] CSW Progress Notes  Called patient, states she is "done" with physical therapy but that PT had recommended compression vest for lymphedema treatment.  Was measured for compression vest and was notified yesterday that her portion would be over $1000 after insurance has paid their portion.  Asking for options for any financial help.  Will send her information on [employer] and [facility].  She is not in active treatment but is taking tamoxifan. Her current treatment status and her financial/household information will impact her ability to apply for these resources.  CSW will mail patient information on how to apply for help from these two agencies - pt will contact CSW if she needs assistance to submit application.  Edwyna Shell, LCSW Clinical Social Worker Phone:  [phone]

## 2019-01-11 ENCOUNTER — Telehealth: Payer: Self-pay | Admitting: General Practice

## 2019-01-11 NOTE — Telephone Encounter (Signed)
[facility] CSW Progress Notes  Call to patient to go over financial assistance materials mailed last week ([employer] in [place]) - pt to determine if she meets eligibility criteria.  CSW can assist as needed.  Left VM w my contact information as I was unable to reach her.  Edwyna Shell, LCSW Clinical Social Worker Phone:  [phone]

## 2019-01-15 ENCOUNTER — Other Ambulatory Visit: Payer: Self-pay | Admitting: Family Medicine

## 2019-01-18 ENCOUNTER — Other Ambulatory Visit: Payer: Self-pay | Admitting: Family Medicine

## 2019-01-25 ENCOUNTER — Ambulatory Visit (INDEPENDENT_AMBULATORY_CARE_PROVIDER_SITE_OTHER): Payer: 59 | Admitting: Family Medicine

## 2019-01-25 ENCOUNTER — Encounter: Payer: Self-pay | Admitting: Family Medicine

## 2019-01-25 ENCOUNTER — Other Ambulatory Visit: Payer: Self-pay

## 2019-01-25 DIAGNOSIS — E119 Type 2 diabetes mellitus without complications: Secondary | ICD-10-CM

## 2019-01-25 DIAGNOSIS — I10 Essential (primary) hypertension: Secondary | ICD-10-CM

## 2019-01-25 DIAGNOSIS — K219 Gastro-esophageal reflux disease without esophagitis: Secondary | ICD-10-CM

## 2019-01-25 DIAGNOSIS — M25572 Pain in left ankle and joints of left foot: Secondary | ICD-10-CM | POA: Diagnosis not present

## 2019-01-25 MED ORDER — LISINOPRIL 10 MG PO TABS: 10.0000 mg | ORAL_TABLET | Freq: Every day | ORAL | 3 refills | Status: DC

## 2019-01-25 MED ORDER — METOPROLOL TARTRATE 50 MG PO TABS: 50.0000 mg | ORAL_TABLET | Freq: Two times a day (BID) | ORAL | 3 refills | Status: DC

## 2019-01-25 MED ORDER — AMLODIPINE BESYLATE 10 MG PO TABS: 10.0000 mg | ORAL_TABLET | Freq: Every day | ORAL | 3 refills | Status: DC

## 2019-01-25 MED ORDER — OMEPRAZOLE 40 MG PO CPDR: 40.0000 mg | DELAYED_RELEASE_CAPSULE | Freq: Two times a day (BID) | ORAL | 3 refills | Status: DC

## 2019-01-25 NOTE — Progress Notes (Signed)
Subjective:    Patient ID: [name], female    DOB: [birth year], 59 y.o.   MRN: [id number]  HPI Virtual Visit via Video Note  I connected with the patient on 01/25/19 at 10:45 AM EDT by a video enabled telemedicine application and verified that I am speaking with the correct person using two identifiers.  Location patient: home Location provider:work or home office Persons participating in the virtual visit: patient, provider  I discussed the limitations of evaluation and management by telemedicine and the availability of in person appointments. The patient expressed understanding and agreed to proceed.   HPI: Here for several issues. First she has had some epigastric pains off and on for the past 4 weeks. Some nausea but no vomiting. No fever or trouble swallowing. Appetite is good. Her BMs are mostly regular but they are loose at times. She takes Omeprazole 40 mg once daily in the mornings. Also the left ankle has been swollen and mildly painful for about 2 months. No recent trauma. It is never red or warm. The right ankle does not swell. Otherwise she needs some refills. Her am fasting glucoses average 120-130. Her BP is stable, and yesterday at Dr. Myles Rosenthal' office it was 133/76.    ROS: See pertinent positives and negatives per HPI.  Past Medical History:  Diagnosis Date  . Breast cancer ([facility]) 2017   right breast  . Breast cancer of lower-inner quadrant of right female breast ([facility]) 08/08/2015  . Colon polyps   . DDD (degenerative disc disease), cervical   . Degenerative joint disease   . Diabetes mellitus without complication ([facility])   . Diverticulosis   . Gallstones   . GERD (gastroesophageal reflux disease)   . Headache(784.0)    migraine like per patient  . Hypertension   . Low back pain    dr phillips, pain management  . Osteoarthritis   . Personal history of chemotherapy 2017  . Personal history of radiation therapy 2017    Past Surgical History:  Procedure  Laterality Date  . CHOLECYSTECTOMY  10/28/2011   Procedure: LAPAROSCOPIC CHOLECYSTECTOMY WITH INTRAOPERATIVE CHOLANGIOGRAM;  Surgeon: Earnstine Regal, MD;  [facility];  Service: General;  Laterality: N/A;  . COLONOSCOPY WITH ESOPHAGOGASTRODUODENOSCOPY (EGD)  02/03/2013   with Propofol  . EXAM UNDER ANESTHESIA WITH MANIPULATION OF KNEE Right 05/30/2003  . EXAM UNDER ANESTHESIA WITH MANIPULATION OF KNEE Left 12/27/2002  . GANGLION CYST EXCISION Right    right wrist  . KNEE ARTHROSCOPY Bilateral 01/02/2000  . KNEE ARTHROSCOPY Right   . LAPAROSCOPIC VAGINAL HYSTERECTOMY WITH SALPINGO OOPHORECTOMY Bilateral 07/13/2006  . LIPOMA EXCISION Right 02/21/2008   hip  . MASS EXCISION  08/24/2012   Procedure: EXCISION MASS;  Surgeon: Earnstine Regal, MD;  Location: [facility];  Service: General;  Laterality: Left;  excisie soft tissue masses left shoulder(back) & left upper arm  . MASS EXCISION Left 01/24/2014   Procedure: EXCISION SOFT TISSUE MASSES LOWER LEFT BACK;  Surgeon: Earnstine Regal, MD;  Location: [facility];  Service: General;  Laterality: Left;  Marland Kitchen MASS EXCISION Right 04/30/2016   Procedure: EXCISION OF SKIN RIGHT CHEST WALL;  Surgeon: Autumn Messing III, MD;  Location: [facility];  Service: General;  Laterality: Right;  EXCISION OF SKIN RIGHT CHEST WALL  . MASTECTOMY Right 2017  . MASTECTOMY W/ SENTINEL NODE BIOPSY Right 10/01/2015   Procedure: RIGHT MASTECTOMY WITH SENTINEL LYMPH NODE BIOPSY;  Surgeon: Autumn Messing III, MD;  [facility];  Service: General;  Laterality: Right;  . PORT-A-CATH REMOVAL Left 04/30/2016   Procedure: REMOVAL PORT-A-CATH;  Surgeon: Autumn Messing III, MD;  Location: [facility];  Service: General;  Laterality: Left;  REMOVAL PORT-A-CATH  . PORTACATH PLACEMENT Left 11/01/2015   Procedure: INSERTION PORT-A-CATH;  Surgeon: Autumn Messing III, MD;  Location: [facility];  Service: General;  Laterality: Left;  . REPLACEMENT  UNICONDYLAR JOINT KNEE Left 04/20/2001  . REVISION TOTAL KNEE ARTHROPLASTY Right 06/18/2004  . REVISION TOTAL KNEE ARTHROPLASTY Left 11/15/2002  . REVISION TOTAL KNEE ARTHROPLASTY Left 10/12/2001  . REVISION TOTAL KNEE ARTHROPLASTY Right 07/09/2015  . TONSILLECTOMY    . TOTAL KNEE ARTHROPLASTY Right 04/25/2003  . TOTAL KNEE ARTHROPLASTY Left 06/25/2009    Family History  Problem Relation Age of Onset  . Stomach cancer Mother   . Heart disease Father   . Prostate cancer Father   . Kidney cancer Sister        one out of the four  . Hypertension Sister   . Hypertension Brother   . Crohn's disease Other        nephew  . Colon cancer Maternal Grandmother   . Esophageal cancer Neg Hx      Current Outpatient Medications:  .  amitriptyline (ELAVIL) 25 MG tablet, Take 50 mg by mouth at bedtime. , Disp: , Rfl:  .  amLODipine (NORVASC) 10 MG tablet, Take 1 tablet (10 mg total) by mouth daily., Disp: 90 tablet, Rfl: 3 .  benzonatate (TESSALON) 200 MG capsule, Take 1 capsule (200 mg total) by mouth 2 (two) times daily as needed for cough., Disp: 30 capsule, Rfl: 1 .  KLOR-CON M20 20 MEQ tablet, Take 20 mEq by mouth daily. , Disp: , Rfl:  .  metFORMIN (GLUCOPHAGE) 500 MG tablet, TAKE 1 TABLET BY MOUTH TWICE A DAY WITH A MEAL (Patient taking differently: Take 500 mg by mouth daily with breakfast. ), Disp: 60 tablet, Rfl: 8 .  metoprolol tartrate (LOPRESSOR) 50 MG tablet, Take 1 tablet (50 mg total) by mouth 2 (two) times daily., Disp: 180 tablet, Rfl: 3 .  omeprazole (PRILOSEC) 40 MG capsule, Take 1 capsule (40 mg total) by mouth 2 (two) times daily., Disp: 180 capsule, Rfl: 3 .  ONE TOUCH ULTRA TEST test strip, TEST ONCE DAILY, Disp: 25 each, Rfl: 3 .  ONETOUCH DELICA LANCETS FINE MISC, USE TO TEST ONCE DAILY DX CODE E11.9, Disp: 100 each, Rfl: 0 .  pregabalin (LYRICA) 75 MG capsule, Take 1 capsule (75 mg total) by mouth 2 (two) times daily., Disp: 60 capsule, Rfl: 0 .  tamoxifen (NOLVADEX) 20  MG tablet, Take 20 mg by mouth daily., Disp: , Rfl:  .  zolpidem (AMBIEN) 10 MG tablet, TAKE 1 TABLET BY MOUTH AT BEDTIME AS NEEDED FOR INSOMNIA, Disp: 30 tablet, Rfl: 5 .  lisinopril (ZESTRIL) 10 MG tablet, Take 1 tablet (10 mg total) by mouth daily., Disp: 90 tablet, Rfl: 3  EXAM:  VITALS per patient if applicable:  GENERAL: alert, oriented, appears well and in no acute distress  HEENT: atraumatic, conjunttiva clear, no obvious abnormalities on inspection of external nose and ears  NECK: normal movements of the head and neck  LUNGS: on inspection no signs of respiratory distress, breathing rate appears normal, no obvious gross SOB, gasping or wheezing  CV: no obvious cyanosis  MS: moves all visible extremities without noticeable abnormality  PSYCH/NEURO: pleasant and cooperative, no obvious depression or anxiety,  speech and thought processing grossly intact  ASSESSMENT AND PLAN: The epigastric pain is likely due to GERD, so we will increase the Omeprazole to BID. The ankle swelling is likely due to degenerative arthritis and she can use ice and Ibuprofen as needed. This doe snot sound like gout, and it is not likely to be a medication side effect because the swelling is unilateral. Her HTN and diabetes are well controlled.  Alysia Penna, MD  Discussed the following assessment and plan:  No diagnosis found.     I discussed the assessment and treatment plan with the patient. The patient was provided an opportunity to ask questions and all were answered. The patient agreed with the plan and demonstrated an understanding of the instructions.   The patient was advised to call back or seek an in-person evaluation if the symptoms worsen or if the condition fails to improve as anticipated.     Review of Systems     Objective:   Physical Exam        Assessment & Plan:

## 2019-01-26 DIAGNOSIS — C50511 Malignant neoplasm of lower-outer quadrant of right female breast: Secondary | ICD-10-CM | POA: Diagnosis not present

## 2019-01-28 ENCOUNTER — Other Ambulatory Visit: Payer: Self-pay | Admitting: General Surgery

## 2019-01-28 DIAGNOSIS — R1084 Generalized abdominal pain: Secondary | ICD-10-CM

## 2019-02-02 ENCOUNTER — Ambulatory Visit (INDEPENDENT_AMBULATORY_CARE_PROVIDER_SITE_OTHER): Payer: 59 | Admitting: Specialist

## 2019-02-02 ENCOUNTER — Ambulatory Visit: Payer: Self-pay | Admitting: Specialist

## 2019-02-07 ENCOUNTER — Ambulatory Visit: Payer: 59 | Admitting: Specialist

## 2019-02-07 ENCOUNTER — Ambulatory Visit (INDEPENDENT_AMBULATORY_CARE_PROVIDER_SITE_OTHER): Payer: 59

## 2019-02-07 ENCOUNTER — Other Ambulatory Visit: Payer: Self-pay

## 2019-02-07 ENCOUNTER — Encounter: Payer: Self-pay | Admitting: Specialist

## 2019-02-07 VITALS — BP 138/80 | HR 66 | Ht 67.0 in | Wt 253.0 lb

## 2019-02-07 DIAGNOSIS — Z981 Arthrodesis status: Secondary | ICD-10-CM

## 2019-02-07 NOTE — Progress Notes (Signed)
Office Visit Note   Patient: [name]           Date of Birth: 1961/06/01           MRN: [id number] Visit Date: 02/07/2019              Requested by: Laurey Morale, MD [place], [place] [id number] PCP: Laurey Morale, MD   Assessment & Plan: Visit Diagnoses:  1. S/P lumbar fusion     Plan: Avoid overhead lifting and overhead use of the arms. Do not lift greater than 5 lbs. Adjust head rest in vehicle to prevent hyperextension if rear ended. Take extra precautions to avoid falling. Avoid frequent bending and stooping  No lifting greater than 10 lbs. May use ice or moist heat for pain. Weight loss is of benefit. Best medication for lumbar disc disease is arthritis medications like tylenol ES. Exercise is important to improve your indurance and does allow people to function better inspite of back pain.  Follow-Up Instructions: Return in about 4 weeks (around 03/07/2019).   Orders:  Orders Placed This Encounter  Procedures  . XR Lumbar Spine 2-3 Views   No orders of the defined types were placed in this encounter.     Procedures: No procedures performed   Clinical Data: No additional findings.   Subjective: Chief Complaint  Patient presents with  . Lower Back - Follow-up    07/13/2018: Had TLIF Rt L4-5, L5-S1, and Decompressive Laminectomies L4-5, L51-S53    58 year old female with history of lumbar fusion L4-5 and L5-S1 in 06/2018. She has intermittant  Numbness into the left leg and foot but is able to stand and walk for exercise and is doing some  Gardening. No bowel or bladder dysfunction. She is not taking much medication but does go to pain management with Dr. Hardin Negus for pain.   Review of Systems   Objective: Vital Signs: BP 138/80 (BP Location: Left Arm, Patient Position: Sitting)   Pulse 66   Ht 5\' 7"  (1.702 m)   Wt 253 lb (114.8 kg)   BMI 39.63 kg/m   Physical Exam  Ortho Exam  Specialty Comments:  No specialty  comments available.  Imaging: Xr Lumbar Spine 2-3 Views  Result Date: 02/07/2019 AP and lateral flexion and extension radiographs with pedicle screws and rods L4 to S1 with interbody fusion at L4-5 and L5-S1. With flexion and extension there is less than 1 mm of motion over the anterior Lumbar spine L4 to S1.     PMFS History: Patient Active Problem List   Diagnosis Date Noted  . DDD (degenerative disc disease), lumbar 07/16/2018    Priority: High    Class: Chronic  . Other spondylosis with radiculopathy, lumbar region 07/16/2018    Priority: High    Class: Chronic  . S/P lumbar fusion 07/13/2018  . Tenosynovitis, de Quervain 08/10/2017  . 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 Insomnia 07/16/2016  . Left groin pain 11/30/2015  . Severe obesity (BMI >= 40) ([place]) 10/26/2015  . Malignant neoplasm of lower-inner quadrant of right breast of female, estrogen receptor positive ([place]) 08/08/2015  . S/P revision of total knee 07/24/2015  . Acute postoperative pain 07/11/2015  . Loose total knee arthroplasty ([place]) 07/02/2015  . Type 2 diabetes mellitus without complications ([place]) 78/67/6720  . Adhesive capsulitis of left shoulder 10/11/2014  . Paraspinous mass, left 03/31/2013  . Other symptoms and signs involving the nervous system 03/31/2013  . Internal  hemorrhoid 02/17/2013  . Abdominal pain, other specified site 02/17/2013  . Neoplasm of soft tissue, left shoulder and left upper arm 08/03/2012  . Lightheadedness 01/29/2012  . Cholelithiasis with cholecystitis 10/08/2011  . Hypokalemia 09/17/2011  . Gastro-esophageal reflux disease without esophagitis 08/08/2010  . Gout 12/18/2008  . Essential (primary) hypertension 03/15/2007  . OA (osteoarthritis) of knee 03/15/2007  . HEADACHE 03/15/2007   Past Medical History:  Diagnosis Date  . Breast cancer ([place]) 2017   right breast  . Breast cancer of lower-inner quadrant of right female breast ([place]) 08/08/2015  . Colon polyps   . DDD  (degenerative disc disease), cervical   . Degenerative joint disease   . Diabetes mellitus without complication ([place])   . Diverticulosis   . Gallstones   . GERD (gastroesophageal reflux disease)   . Headache(784.0)    migraine like per patient  . Hypertension   . Low back pain    dr phillips, pain management  . Osteoarthritis   . Personal history of chemotherapy 2017  . Personal history of radiation therapy 2017    Family History  Problem Relation Age of Onset  . Stomach cancer Mother   . Heart disease Father   . Prostate cancer Father   . Kidney cancer Sister        one out of the four  . Hypertension Sister   . Hypertension Brother   . Crohn's disease Other        nephew  . Colon cancer Maternal Grandmother   . Esophageal cancer Neg Hx     Past Surgical History:  Procedure Laterality Date  . CHOLECYSTECTOMY  10/28/2011   Procedure: LAPAROSCOPIC CHOLECYSTECTOMY WITH INTRAOPERATIVE CHOLANGIOGRAM;  Surgeon: Earnstine Regal, MD;  Location: [facility];  Service: General;  Laterality: N/A;  . COLONOSCOPY WITH ESOPHAGOGASTRODUODENOSCOPY (EGD)  02/03/2013   with Propofol  . EXAM UNDER ANESTHESIA WITH MANIPULATION OF KNEE Right 05/30/2003  . EXAM UNDER ANESTHESIA WITH MANIPULATION OF KNEE Left 12/27/2002  . GANGLION CYST EXCISION Right    right wrist  . KNEE ARTHROSCOPY Bilateral 01/02/2000  . KNEE ARTHROSCOPY Right   . LAPAROSCOPIC VAGINAL HYSTERECTOMY WITH SALPINGO OOPHORECTOMY Bilateral 07/13/2006  . LIPOMA EXCISION Right 02/21/2008   hip  . MASS EXCISION  08/24/2012   Procedure: EXCISION MASS;  Surgeon: Earnstine Regal, MD;  Location: [facility];  Service: General;  Laterality: Left;  excisie soft tissue masses left shoulder(back) & left upper arm  . MASS EXCISION Left 01/24/2014   Procedure: EXCISION SOFT TISSUE MASSES LOWER LEFT BACK;  Surgeon: Earnstine Regal, MD;  Location: [facility];  Service: General;  Laterality: Left;  Marland Kitchen MASS EXCISION Right  04/30/2016   Procedure: EXCISION OF SKIN RIGHT CHEST WALL;  Surgeon: Autumn Messing III, MD;  Location: [facility];  Service: General;  Laterality: Right;  EXCISION OF SKIN RIGHT CHEST WALL  . MASTECTOMY Right 2017  . MASTECTOMY W/ SENTINEL NODE BIOPSY Right 10/01/2015   Procedure: RIGHT MASTECTOMY WITH SENTINEL LYMPH NODE BIOPSY;  Surgeon: Autumn Messing III, MD;  Location: [facility];  Service: General;  Laterality: Right;  . PORT-A-CATH REMOVAL Left 04/30/2016   Procedure: REMOVAL PORT-A-CATH;  Surgeon: Autumn Messing III, MD;  Location: [facility];  Service: General;  Laterality: Left;  REMOVAL PORT-A-CATH  . PORTACATH PLACEMENT Left 11/01/2015   Procedure: INSERTION PORT-A-CATH;  Surgeon: Autumn Messing III, MD;  Location: [facility];  Service: General;  Laterality: Left;  .  REPLACEMENT UNICONDYLAR JOINT KNEE Left 04/20/2001  . REVISION TOTAL KNEE ARTHROPLASTY Right 06/18/2004  . REVISION TOTAL KNEE ARTHROPLASTY Left 11/15/2002  . REVISION TOTAL KNEE ARTHROPLASTY Left 10/12/2001  . REVISION TOTAL KNEE ARTHROPLASTY Right 07/09/2015  . TONSILLECTOMY    . TOTAL KNEE ARTHROPLASTY Right 04/25/2003  . TOTAL KNEE ARTHROPLASTY Left 06/25/2009   Social History   Occupational History  . Occupation: retired/disabled  Tobacco Use  . Smoking status: Current Every Day Smoker    Packs/day: 0.25    Years: 20.00    Pack years: 5.00    Types: Cigarettes  . Smokeless tobacco: Former Systems developer    Quit date: 09/30/2015  . Tobacco comment: smokes about 4 cigs/day; working on quitting - 08/22/16 gwd  Substance and Sexual Activity  . Alcohol use: No    Alcohol/week: 0.0 standard drinks  . Drug use: No  . Sexual activity: Yes    Birth control/protection: Post-menopausal

## 2019-02-07 NOTE — Patient Instructions (Signed)
Avoid overhead lifting and overhead use of the arms. Do not lift greater than 5 lbs. Adjust head rest in vehicle to prevent hyperextension if rear ended. Take extra precautions to avoid falling. Avoid frequent bending and stooping  No lifting greater than 10 lbs. May use ice or moist heat for pain. Weight loss is of benefit. Best medication for lumbar disc disease is arthritis medications like tylenol ES. Exercise is important to improve your indurance and does allow people to function better inspite of back pain.

## 2019-02-09 ENCOUNTER — Encounter: Payer: Self-pay | Admitting: Specialist

## 2019-02-10 ENCOUNTER — Other Ambulatory Visit: Payer: Self-pay | Admitting: General Surgery

## 2019-02-10 DIAGNOSIS — R1013 Epigastric pain: Secondary | ICD-10-CM

## 2019-02-11 ENCOUNTER — Other Ambulatory Visit: Payer: Self-pay | Admitting: General Surgery

## 2019-02-11 DIAGNOSIS — R1013 Epigastric pain: Secondary | ICD-10-CM

## 2019-02-18 ENCOUNTER — Ambulatory Visit
Admission: RE | Admit: 2019-02-18 | Discharge: 2019-02-18 | Disposition: A | Discharge status: Home / Self Care | Payer: 59 | Source: Ambulatory Visit | Attending: General Surgery | Admitting: General Surgery

## 2019-02-18 DIAGNOSIS — R1013 Epigastric pain: Secondary | ICD-10-CM

## 2019-02-18 MED ORDER — IOPAMIDOL (ISOVUE-300) INJECTION 61%
125.0000 mL | Freq: Once | INTRAVENOUS | Status: AC | PRN
  Administered 2019-02-18: 125 mL via INTRAVENOUS

## 2019-04-19 ENCOUNTER — Encounter (HOSPITAL_COMMUNITY): Payer: Self-pay | Admitting: Emergency Medicine

## 2019-04-19 ENCOUNTER — Emergency Department (HOSPITAL_COMMUNITY)
Admission: EM | Admit: 2019-04-19 | Discharge: 2019-04-19 | Disposition: A | Discharge status: Home / Self Care | Payer: 59 | Attending: Emergency Medicine | Admitting: Emergency Medicine

## 2019-04-19 ENCOUNTER — Ambulatory Visit: Payer: Self-pay | Admitting: *Deleted

## 2019-04-19 ENCOUNTER — Emergency Department (HOSPITAL_COMMUNITY): Payer: 59

## 2019-04-19 DIAGNOSIS — Z7984 Long term (current) use of oral hypoglycemic drugs: Secondary | ICD-10-CM | POA: Insufficient documentation

## 2019-04-19 DIAGNOSIS — Z853 Personal history of malignant neoplasm of breast: Secondary | ICD-10-CM | POA: Insufficient documentation

## 2019-04-19 DIAGNOSIS — F17210 Nicotine dependence, cigarettes, uncomplicated: Secondary | ICD-10-CM | POA: Insufficient documentation

## 2019-04-19 DIAGNOSIS — R55 Syncope and collapse: Secondary | ICD-10-CM | POA: Insufficient documentation

## 2019-04-19 DIAGNOSIS — Z91040 Latex allergy status: Secondary | ICD-10-CM | POA: Diagnosis not present

## 2019-04-19 DIAGNOSIS — E119 Type 2 diabetes mellitus without complications: Secondary | ICD-10-CM | POA: Diagnosis not present

## 2019-04-19 DIAGNOSIS — Z96653 Presence of artificial knee joint, bilateral: Secondary | ICD-10-CM | POA: Diagnosis not present

## 2019-04-19 DIAGNOSIS — I10 Essential (primary) hypertension: Secondary | ICD-10-CM | POA: Insufficient documentation

## 2019-04-19 DIAGNOSIS — Z79899 Other long term (current) drug therapy: Secondary | ICD-10-CM | POA: Insufficient documentation

## 2019-04-19 LAB — URINALYSIS, ROUTINE W REFLEX MICROSCOPIC
Bilirubin Urine: NEGATIVE
Glucose, UA: NEGATIVE mg/dL
Ketones, ur: NEGATIVE mg/dL
Leukocytes,Ua: NEGATIVE
Nitrite: NEGATIVE
Protein, ur: NEGATIVE mg/dL
Specific Gravity, Urine: 1.016 (ref 1.005–1.030)
pH: 6 (ref 5.0–8.0)

## 2019-04-19 LAB — BASIC METABOLIC PANEL
Anion gap: 14 (ref 5–15)
BUN: 10 mg/dL (ref 6–20)
CO2: 22 mmol/L (ref 22–32)
Calcium: 8.9 mg/dL (ref 8.9–10.3)
Chloride: 104 mmol/L (ref 98–111)
Creatinine, Ser: 0.93 mg/dL (ref 0.44–1.00)
GFR calc Af Amer: >60 mL/min (ref >60)
GFR calc non Af Amer: >60 mL/min (ref >60)
Glucose, Bld: 101 mg/dL — ABNORMAL HIGH (ref 70–99)
Potassium: 3.2 mmol/L — ABNORMAL LOW (ref 3.5–5.1)
Sodium: 140 mmol/L (ref 135–145)

## 2019-04-19 LAB — CBC
HCT: 40.1 % (ref 36.0–46.0)
Hemoglobin: 12.9 g/dL (ref 12.0–15.0)
MCH: 29.8 pg (ref 26.0–34.0)
MCHC: 32.2 g/dL (ref 30.0–36.0)
MCV: 92.6 fL (ref 80.0–100.0)
Platelets: 237 10*3/uL (ref 150–400)
RBC: 4.33 MIL/uL (ref 3.87–5.11)
RDW: 13.3 % (ref 11.5–15.5)
WBC: 6.9 10*3/uL (ref 4.0–10.5)
nRBC: 0 % (ref 0.0–0.2)

## 2019-04-19 LAB — I-STAT BETA HCG BLOOD, ED (MC, WL, AP ONLY): I-stat hCG, quantitative: <5 m[IU]/mL (ref <5)

## 2019-04-19 LAB — TROPONIN I (HIGH SENSITIVITY)
Troponin I (High Sensitivity): 7 ng/L (ref <18)
Troponin I (High Sensitivity): 7 ng/L (ref <18)

## 2019-04-19 MED ORDER — POTASSIUM CHLORIDE CRYS ER 20 MEQ PO TBCR
40.0000 meq | EXTENDED_RELEASE_TABLET | Freq: Once | ORAL | Status: AC
  Administered 2019-04-19: 40 meq via ORAL
  Filled 2019-04-19: qty 2

## 2019-04-19 MED ORDER — SODIUM CHLORIDE 0.9% FLUSH: 3.0000 mL | Freq: Once | INTRAVENOUS | Status: DC

## 2019-04-19 NOTE — ED Triage Notes (Signed)
Pt reports she was washing clothes an hour ago and she had an episode where she broke out into a sweat, felt weak had substernal chest pressure and tingling in her lips.  Pt states a similar episode happened on Sunday as well. Pt still has chest pressure with tingling in lips. Pt is alert and ox4, clear speech. No weakness.

## 2019-04-19 NOTE — ED Notes (Signed)
Patient verbalizes understanding of discharge instructions. Opportunity for questioning and answers were provided. Armband removed by staff, pt discharged from ED.  

## 2019-04-19 NOTE — ED Provider Notes (Signed)
[facility] EMERGENCY DEPARTMENT Provider Note   CSN: [id number] Arrival date & time: 04/19/19  1357     History   Chief Complaint Chief Complaint  Patient presents with  . Near Syncope    HPI [name] is a 58 y.o. female.     Patient c/o a couple episodes of near syncope in past week. Indicates this past Sunday, was at church, went to stand, and felt faint - indicates family member held onto her and assisted her back down. Today, was standing folding clothes, when felt faint/lightheaded, and felt sweaty, warm, as if may pass out. No trauma/fall. Denies associated chest pain or discomfort. No palpitations. Denies headache. No recent blood loss, rectal bleeding or melena. No cough or fever. No known covid+ exposure. No dysuria or gu c/o.   The history is provided by the patient.  Near Syncope Pertinent negatives include no chest pain, no abdominal pain, no headaches and no shortness of breath.    Past Medical History:  Diagnosis Date  . Breast cancer ([place]) 2017   right breast  . Breast cancer of lower-inner quadrant of right female breast ([place]) 08/08/2015  . Colon polyps   . DDD (degenerative disc disease), cervical   . Degenerative joint disease   . Diabetes mellitus without complication ([place])   . Diverticulosis   . Gallstones   . GERD (gastroesophageal reflux disease)   . Headache(784.0)    migraine like per patient  . Hypertension   . Low back pain    dr phillips, pain management  . Osteoarthritis   . Personal history of chemotherapy 2017  . Personal history of radiation therapy 2017    Patient Active Problem List   Diagnosis Date Noted  . DDD (degenerative disc disease), lumbar 07/16/2018    Class: Chronic  . Other spondylosis with radiculopathy, lumbar region 07/16/2018    Class: Chronic  . S/P lumbar fusion 07/13/2018  . Tenosynovitis, de Quervain 08/10/2017  . 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 Insomnia 07/16/2016  . Left groin  pain 11/30/2015  . Severe obesity (BMI >= 40) ([place]) 10/26/2015  . Malignant neoplasm of lower-inner quadrant of right breast of female, estrogen receptor positive ([place]) 08/08/2015  . S/P revision of total knee 07/24/2015  . Acute postoperative pain 07/11/2015  . Loose total knee arthroplasty ([place]) 07/02/2015  . Type 2 diabetes mellitus without complications ([place]) 67/61/9509  . Adhesive capsulitis of left shoulder 10/11/2014  . Paraspinous mass, left 03/31/2013  . Other symptoms and signs involving the nervous system 03/31/2013  . Internal hemorrhoid 02/17/2013  . Abdominal pain, other specified site 02/17/2013  . Neoplasm of soft tissue, left shoulder and left upper arm 08/03/2012  . Lightheadedness 01/29/2012  . Cholelithiasis with cholecystitis 10/08/2011  . Hypokalemia 09/17/2011  . Gastro-esophageal reflux disease without esophagitis 08/08/2010  . Gout 12/18/2008  . Essential (primary) hypertension 03/15/2007  . OA (osteoarthritis) of knee 03/15/2007  . HEADACHE 03/15/2007    Past Surgical History:  Procedure Laterality Date  . CHOLECYSTECTOMY  10/28/2011   Procedure: LAPAROSCOPIC CHOLECYSTECTOMY WITH INTRAOPERATIVE CHOLANGIOGRAM;  Surgeon: Earnstine Regal, MD;  [facility];  Service: General;  Laterality: N/A;  . COLONOSCOPY WITH ESOPHAGOGASTRODUODENOSCOPY (EGD)  02/03/2013   with Propofol  . EXAM UNDER ANESTHESIA WITH MANIPULATION OF KNEE Right 05/30/2003  . EXAM UNDER ANESTHESIA WITH MANIPULATION OF KNEE Left 12/27/2002  . GANGLION CYST EXCISION Right    right wrist  . KNEE ARTHROSCOPY Bilateral 01/02/2000  . KNEE ARTHROSCOPY  Right   . LAPAROSCOPIC VAGINAL HYSTERECTOMY WITH SALPINGO OOPHORECTOMY Bilateral 07/13/2006  . LIPOMA EXCISION Right 02/21/2008   hip  . MASS EXCISION  08/24/2012   Procedure: EXCISION MASS;  Surgeon: Earnstine Regal, MD;  [facility];  Service: General;  Laterality: Left;  excisie soft tissue masses left shoulder(back) & left  upper arm  . MASS EXCISION Left 01/24/2014   Procedure: EXCISION SOFT TISSUE MASSES LOWER LEFT BACK;  Surgeon: Earnstine Regal, MD;  Location: [facility];  Service: General;  Laterality: Left;  Marland Kitchen MASS EXCISION Right 04/30/2016   Procedure: EXCISION OF SKIN RIGHT CHEST WALL;  Surgeon: Autumn Messing III, MD;  Location: [facility];  Service: General;  Laterality: Right;  EXCISION OF SKIN RIGHT CHEST WALL  . MASTECTOMY Right 2017  . MASTECTOMY W/ SENTINEL NODE BIOPSY Right 10/01/2015   Procedure: RIGHT MASTECTOMY WITH SENTINEL LYMPH NODE BIOPSY;  Surgeon: Autumn Messing III, MD;  Location: [facility];  Service: General;  Laterality: Right;  . PORT-A-CATH REMOVAL Left 04/30/2016   Procedure: REMOVAL PORT-A-CATH;  Surgeon: Autumn Messing III, MD;  Location: [facility];  Service: General;  Laterality: Left;  REMOVAL PORT-A-CATH  . PORTACATH PLACEMENT Left 11/01/2015   Procedure: INSERTION PORT-A-CATH;  Surgeon: Autumn Messing III, MD;  Location: [facility];  Service: General;  Laterality: Left;  . REPLACEMENT UNICONDYLAR JOINT KNEE Left 04/20/2001  . REVISION TOTAL KNEE ARTHROPLASTY Right 06/18/2004  . REVISION TOTAL KNEE ARTHROPLASTY Left 11/15/2002  . REVISION TOTAL KNEE ARTHROPLASTY Left 10/12/2001  . REVISION TOTAL KNEE ARTHROPLASTY Right 07/09/2015  . TONSILLECTOMY    . TOTAL KNEE ARTHROPLASTY Right 04/25/2003  . TOTAL KNEE ARTHROPLASTY Left 06/25/2009     OB History   No obstetric history on file.      Home Medications    Prior to Admission medications   Medication Sig Start Date End Date Taking? Authorizing Provider  amitriptyline (ELAVIL) 25 MG tablet Take 50 mg by mouth at bedtime.     [provider]  amLODipine (NORVASC) 10 MG tablet Take 1 tablet (10 mg total) by mouth daily. 01/25/19   Laurey Morale, MD  amLODipine (NORVASC) 5 MG tablet TAKE 2 TABLETS (10 MG TOTAL) BY MOUTH ONCE FOR 1 DOSE. 01/25/19 01/25/19  Laurey Morale, MD  benzonatate  (TESSALON) 200 MG capsule Take 1 capsule (200 mg total) by mouth 2 (two) times daily as needed for cough. 10/26/18   Laurey Morale, MD  KLOR-CON M20 20 MEQ tablet Take 20 mEq by mouth daily.  02/15/18   [provider]  lisinopril (ZESTRIL) 10 MG tablet TAKE 2 TABLETS (20 MG TOTAL) BY MOUTH ONCE FOR 1 DOSE. 01/25/19 01/25/19  Laurey Morale, MD  lisinopril (ZESTRIL) 10 MG tablet Take 1 tablet (10 mg total) by mouth daily. 01/25/19   Laurey Morale, MD  metFORMIN (GLUCOPHAGE) 500 MG tablet TAKE 1 TABLET BY MOUTH TWICE A DAY WITH A MEAL Patient taking differently: Take 500 mg by mouth daily with breakfast.  06/25/18   Laurey Morale, MD  metoprolol tartrate (LOPRESSOR) 50 MG tablet Take 1 tablet (50 mg total) by mouth 2 (two) times daily. 01/25/19   Laurey Morale, MD  omeprazole (PRILOSEC) 40 MG capsule Take 1 capsule (40 mg total) by mouth 2 (two) times daily. 01/25/19   Laurey Morale, MD  ONE [facility] ULTRA TEST test strip TEST ONCE DAILY 06/26/18   Laurey Morale,  MD  [facility] DELICA LANCETS FINE MISC USE TO TEST ONCE DAILY DX CODE E11.9 12/22/17   Laurey Morale, MD  pregabalin (LYRICA) 75 MG capsule Take 1 capsule (75 mg total) by mouth 2 (two) times daily. 03/03/18   [name], MD  tamoxifen (NOLVADEX) 20 MG tablet Take 20 mg by mouth daily. 01/16/19   [provider]  zolpidem (AMBIEN) 10 MG tablet TAKE 1 TABLET BY MOUTH AT BEDTIME AS NEEDED FOR INSOMNIA 11/15/18   Laurey Morale, MD    Family History Family History  Problem Relation Age of Onset  . Stomach cancer Mother   . Heart disease Father   . Prostate cancer Father   . Kidney cancer Sister        one out of the four  . Hypertension Sister   . Hypertension Brother   . Crohn's disease Other        nephew  . Colon cancer Maternal Grandmother   . Esophageal cancer Neg Hx     Social History Social History   Tobacco Use  . Smoking status: Current Every Day Smoker    Packs/day: 0.25    Years: 20.00    Pack years: 5.00     Types: Cigarettes  . Smokeless tobacco: Former Systems developer    Quit date: 09/30/2015  . Tobacco comment: smokes about 4 cigs/day; working on quitting - 08/22/16 gwd  Substance Use Topics  . Alcohol use: No    Alcohol/week: 0.0 standard drinks  . Drug use: No     Allergies   Codeine, Latex, Sulfonamide derivatives, Gabapentin, and Lyrica [pregabalin]   Review of Systems Review of Systems  Constitutional: Negative for chills and fever.  HENT: Negative for sore throat.   Eyes: Negative for redness and visual disturbance.  Respiratory: Negative for cough and shortness of breath.   Cardiovascular: Positive for near-syncope. Negative for chest pain, palpitations and leg swelling.  Gastrointestinal: Negative for abdominal pain, blood in stool, diarrhea and vomiting.  Endocrine: Negative for polyuria.  Genitourinary: Negative for dysuria and flank pain.  Musculoskeletal: Negative for back pain and neck pain.  Skin: Negative for rash.  Neurological: Positive for light-headedness. Negative for speech difficulty, weakness, numbness and headaches.  Hematological: Does not bruise/bleed easily.  Psychiatric/Behavioral: Negative for confusion.     Physical Exam Updated Vital Signs BP (!) 160/83 (BP Location: Left Arm)   Pulse 72   Temp 98.1 F (36.7 C) (Oral)   Resp 14   SpO2 99%   Physical Exam Vitals signs and nursing note reviewed.  Constitutional:      Appearance: Normal appearance. She is well-developed.  HENT:     Head: Atraumatic.     Nose: Nose normal.     Mouth/Throat:     Mouth: Mucous membranes are moist.  Eyes:     General: No scleral icterus.    Conjunctiva/sclera: Conjunctivae normal.     Pupils: Pupils are equal, round, and reactive to light.  Neck:     Musculoskeletal: Normal range of motion and neck supple. No neck rigidity or muscular tenderness.     Trachea: No tracheal deviation.  Cardiovascular:     Rate and Rhythm: Normal rate and regular rhythm.     Pulses:  Normal pulses.     Heart sounds: Normal heart sounds. No murmur. No friction rub. No gallop.   Pulmonary:     Effort: Pulmonary effort is normal. No respiratory distress.     Breath sounds: Normal breath sounds.  Abdominal:     General: Bowel sounds are normal. There is no distension.     Palpations: Abdomen is soft. There is no mass.     Tenderness: There is no abdominal tenderness. There is no guarding or rebound.     Hernia: No hernia is present.  Genitourinary:    Comments: No cva tenderness.  Musculoskeletal:        General: No tenderness.     Right lower leg: No edema.     Left lower leg: No edema.  Skin:    General: Skin is warm and dry.     Findings: No rash.  Neurological:     Mental Status: She is alert.     Comments: Alert, speech normal/fluent. Motor intact bil, stre 5/5. sens grossly intact bil.   Psychiatric:        Mood and Affect: Mood normal.      ED Treatments / Results  Labs (all labs ordered are listed, but only abnormal results are displayed) Results for orders placed or performed during the hospital encounter of 25/05/39  Basic metabolic panel  Result Value Ref Range   Sodium 140 135 - 145 mmol/L   Potassium 3.2 (L) 3.5 - 5.1 mmol/L   Chloride 104 98 - 111 mmol/L   CO2 22 22 - 32 mmol/L   Glucose, Bld 101 (H) 70 - 99 mg/dL   BUN 10 6 - 20 mg/dL   Creatinine, Ser 0.93 0.44 - 1.00 mg/dL   Calcium 8.9 8.9 - 10.3 mg/dL   GFR calc non Af Amer >60 >60 mL/min   GFR calc Af Amer >60 >60 mL/min   Anion gap 14 5 - 15  CBC  Result Value Ref Range   WBC 6.9 4.0 - 10.5 K/uL   RBC 4.33 3.87 - 5.11 MIL/uL   Hemoglobin 12.9 12.0 - 15.0 g/dL   HCT 40.1 36.0 - 46.0 %   MCV 92.6 80.0 - 100.0 fL   MCH 29.8 26.0 - 34.0 pg   MCHC 32.2 30.0 - 36.0 g/dL   RDW 13.3 11.5 - 15.5 %   Platelets 237 150 - 400 K/uL   nRBC 0.0 0.0 - 0.2 %  Urinalysis, Routine w reflex microscopic  Result Value Ref Range   Color, Urine YELLOW YELLOW   APPearance CLEAR CLEAR    Specific Gravity, Urine 1.016 1.005 - 1.030   pH 6.0 5.0 - 8.0   Glucose, UA NEGATIVE NEGATIVE mg/dL   Hgb urine dipstick MODERATE (A) NEGATIVE   Bilirubin Urine NEGATIVE NEGATIVE   Ketones, ur NEGATIVE NEGATIVE mg/dL   Protein, ur NEGATIVE NEGATIVE mg/dL   Nitrite NEGATIVE NEGATIVE   Leukocytes,Ua NEGATIVE NEGATIVE   RBC / HPF 0-5 0 - 5 RBC/hpf   WBC, UA 0-5 0 - 5 WBC/hpf   Bacteria, UA RARE (A) NONE SEEN   Squamous Epithelial / LPF 0-5 0 - 5   Mucus PRESENT   I-Stat beta hCG blood, ED  Result Value Ref Range   I-stat hCG, quantitative <5.0 <5 mIU/mL   Comment 3          Troponin I (High Sensitivity)  Result Value Ref Range   Troponin I (High Sensitivity) 7 <18 ng/L  Troponin I (High Sensitivity)  Result Value Ref Range   Troponin I (High Sensitivity) 7 <18 ng/L   Dg Chest 2 View  Result Date: 04/19/2019 CLINICAL DATA:  Chest pain. EXAM: CHEST - 2 VIEW COMPARISON:  10/28/2017 FINDINGS: The heart size and mediastinal  contours are within normal limits. Both lungs are clear. The visualized skeletal structures are unremarkable. Multiple surgical clips in the right axilla from prior mastectomy. IMPRESSION: No active cardiopulmonary disease. Electronically Signed   By: Lorriane Shire M.D.   On: 04/19/2019 16:16    EKG EKG Interpretation  Date/Time:  Tuesday April 19 2019 14:02:52 EDT Ventricular Rate:  71 PR Interval:  152 QRS Duration: 92 QT Interval:  428 QTC Calculation: 465 R Axis:   38 Text Interpretation:  Normal sinus rhythm Nonspecific ST and T wave abnormality Baseline wander No significant change since last tracing Confirmed by Lajean Saver [phone]) on 04/19/2019 6:34:13 PM   Radiology Dg Chest 2 View  Result Date: 04/19/2019 CLINICAL DATA:  Chest pain. EXAM: CHEST - 2 VIEW COMPARISON:  10/28/2017 FINDINGS: The heart size and mediastinal contours are within normal limits. Both lungs are clear. The visualized skeletal structures are unremarkable. Multiple surgical  clips in the right axilla from prior mastectomy. IMPRESSION: No active cardiopulmonary disease. Electronically Signed   By: Lorriane Shire M.D.   On: 04/19/2019 16:16    Procedures Procedures (including critical care time)  Medications Ordered in ED Medications  sodium chloride flush (NS) 0.9 % injection 3 mL (has no administration in time range)     Initial Impression / Assessment and Plan / ED Course  I have reviewed the triage vital signs and the nursing notes.  Pertinent labs & imaging results that were available during my care of the patient were reviewed by me and considered in my medical decision making (see chart for details).  Iv ns. Monitor. Ecg. Labs sent.   Reviewed nursing notes and prior charts for additional history. Cardiac cath, multiple yrs ago - no signif cad at that time.   Labs reviewed by me - initial hs trop normal.   CXR reviewed by me  - no pna.   Po fluids.  Pt in sinus rhythm, no dysrhythmia noted.   Await repeat troponin.   Recheck pt - no cp or discomfort, no faintness, no increased wob.   Additional labs reviewed by me - delta hs trop normal. ua neg.   Patient appears stable for d/c. REc cardiology f/u, possible holter. Return precautions provided.     Final Clinical Impressions(s) / ED Diagnoses   Final diagnoses:  None    ED Discharge Orders    None       Lajean Saver, MD 04/19/19 1919

## 2019-04-19 NOTE — ED Notes (Signed)
Pt given a  Bag lunch and water.

## 2019-04-19 NOTE — Telephone Encounter (Signed)
Pt currently in [facility] ED for evaluation.

## 2019-04-19 NOTE — Discharge Instructions (Addendum)
It was our pleasure to provide your ER care today - we hope that you feel better.  Rest. Drink plenty of fluids.   If you begin to feel faint, sit or lie down right away, to minimize chance of fainting and/or injury.   Follow up with cardiologist in the coming week - discuss possible outpatient heart monitoring.   Return to ER if worse, new symptoms, fevers, chest pain, trouble breathing, fainting spells, other concern.

## 2019-04-19 NOTE — Telephone Encounter (Signed)
Patient was out of town last week and had hot spell and about fainted. Patient states it happened again on Sunday- patient sweated and had tingling in fingers and lips and felt faint again. Patient has had this happen before and she states her potassium was low. Patient states her symptoms are bilateral and she recovers with rest and Gatorade/hydration. Patient is a breast cancer survivor and takes Tomoxifen which causes her to sweat a lot. Patient has not had anymore spells- patient has been taking potassium on a regular basis.  Call to office- while calling office patient had another dizzy spell- advised patient to go to ED- she is going to contact her daughter to take her- I will call her back in a few minutes to make sure she got hold of her. Call back to patient- her daughter is on the way- advised if she should get worse before she gets there call 911.  Reason for Disposition . [1] Numbness (i.e., loss of sensation) of the face, arm / hand, or leg / foot on one side of the body AND [2] sudden onset AND [3] brief (now gone)  Answer Assessment - Initial Assessment Questions 1. SYMPTOM: "What is the main symptom you are concerned about?" (e.g., weakness, numbness)     Numbness and tingling in fingers and lips 2. ONSET: "When did this start?" (minutes, hours, days; while sleeping)     It occurred Sunday- lasted for 3 hours- patient states the slight tingling is in her lips 3. LAST NORMAL: "When was the last time you were normal (no symptoms)?"     Saturday- patient states she has had this happen before with low K+ 4. PATTERN "Does this come and go, or has it been constant since it started?"  "Is it present now?"     Comes and goes- got better- patient feels tingling in morning in fingers 5. CARDIAC SYMPTOMS: "Have you had any of the following symptoms: chest pain, difficulty breathing, palpitations?"     no 6. NEUROLOGIC SYMPTOMS: "Have you had any of the following symptoms: headache, dizziness,  vision loss, double vision, changes in speech, unsteady on your feet?"     Dizzy with the spells- otherwise patient recovers 7. OTHER SYMPTOMS: "Do you have any other symptoms?"     no 8. PREGNANCY: "Is there any chance you are pregnant?" "When was your last menstrual period?"     n/a  Protocols used: NEUROLOGIC DEFICIT-A-AH

## 2019-04-20 ENCOUNTER — Other Ambulatory Visit: Payer: Self-pay | Admitting: Family Medicine

## 2019-04-22 ENCOUNTER — Telehealth: Payer: Self-pay | Admitting: Internal Medicine

## 2019-04-22 NOTE — Telephone Encounter (Signed)

## 2019-04-27 ENCOUNTER — Telehealth (INDEPENDENT_AMBULATORY_CARE_PROVIDER_SITE_OTHER): Payer: 59 | Admitting: Internal Medicine

## 2019-04-27 ENCOUNTER — Telehealth: Payer: Self-pay | Admitting: Radiology

## 2019-04-27 ENCOUNTER — Encounter: Payer: Self-pay | Admitting: Internal Medicine

## 2019-04-27 VITALS — BP 151/99 | HR 101

## 2019-04-27 DIAGNOSIS — R079 Chest pain, unspecified: Secondary | ICD-10-CM | POA: Diagnosis not present

## 2019-04-27 DIAGNOSIS — I10 Essential (primary) hypertension: Secondary | ICD-10-CM | POA: Diagnosis not present

## 2019-04-27 DIAGNOSIS — R55 Syncope and collapse: Secondary | ICD-10-CM

## 2019-04-27 DIAGNOSIS — Z9221 Personal history of antineoplastic chemotherapy: Secondary | ICD-10-CM | POA: Diagnosis not present

## 2019-04-27 NOTE — Patient Instructions (Addendum)
Medication Instructions:  Your Physician recommend you continue on your current medication as directed.    If you need a refill on your cardiac medications before your next appointment, please call your pharmacy.   Lab work: None  Testing/Procedures: Your physician has recommended that you wear an event monitor for 30 days. Event monitors are medical devices that record the heart's electrical activity. Doctors most often [place] these monitors to diagnose arrhythmias. Arrhythmias are problems with the speed or rhythm of the heartbeat. The monitor is a small, portable device. You can wear one while you do your normal daily activities. This is usually used to diagnose what is causing palpitations/syncope (passing out).  Your physician has requested that you have a lexiscan myoview. For further information please visit [email or web address]. Please follow instruction sheet, as given. [facility] has requested that you have an echocardiogram. Echocardiography is a painless test that uses sound waves to create images of your heart. It provides your doctor with information about the size and shape of your heart and how well your heart's chambers and valves are working. This procedure takes approximately one hour. There are no restrictions for this procedure. [address]  Follow-Up: Your physician recommends that you schedule a follow-up appointment in 6 weeks with APP.  You are scheduled for a Myocardial Perfusion Imaging Study  The test will take approximately 3 to 4 hours to complete; you may bring reading material.  If someone comes with you to your appointment, they will need to remain in the main lobby due to limited space in the testing area. **If you are pregnant or breastfeeding, please notify the nuclear lab prior to your appointment**  How to prepare for your Myocardial Perfusion Test: . Do not eat or drink 3 hours prior to your test,  except you may have water. . Do not consume products containing caffeine (regular or decaffeinated) 12 hours prior to your test. (ex: coffee, chocolate, sodas, tea). . Do bring a list of your current medications with you.  If not listed below, you may take your medications as normal. . Do wear comfortable clothes (no dresses or overalls) and walking shoes, tennis shoes preferred (No heels or open toe shoes are allowed). . Do NOT wear cologne, perfume, aftershave, or lotions (deodorant is allowed). . If these instructions are not followed, your test will have to be rescheduled.  Please report to [address], [address] for your test.  If you have questions or concerns about your appointment, you can call the Nuclear Lab at [phone].  If you cannot keep your appointment, please provide 24 hours notification to the Nuclear Lab, to avoid a possible $50 charge to your account.

## 2019-04-27 NOTE — Telephone Encounter (Signed)
Enrolled patient for a 30 day [employer] Event monitor to be mailed. Brief instructions were gone over with the patient and she knows to expect the monitor to arrive in 3-4 days.

## 2019-04-27 NOTE — Progress Notes (Signed)
Virtual Visit via Video Note   This visit type was conducted due to national recommendations for restrictions regarding the COVID-19 Pandemic (e.g. social distancing) in an effort to limit this patient's exposure and mitigate transmission in our community.  Due to her co-morbid illnesses, this patient is at least at moderate risk for complications without adequate follow up.  This format is felt to be most appropriate for this patient at this time.  All issues noted in this document were discussed and addressed.  A limited physical exam was performed with this format.  Please refer to the patient's chart for her consent to telehealth for [facility].   Date:  04/27/2019   ID:  [name], DOB September 27, 1960, MRN [id number]  Patient Location: Home Provider Location: Home  PCP:  Laurey Morale, MD  Cardiologist:  No primary care provider on file.  Electrophysiologist:  None   Evaluation Performed:  Follow-Up Visit  Chief Complaint:  presyncope  History of Present Illness:    [name] is a 58 y.o. female with right breast cancer with chemotherapy and radiation, diabetes, GERD, HTN, who presents for episodes of presyncope. Started 1 mo ago. Gets hot and dizzy, and has to sit down. Noticed it while doing laundry last Monday, had significant presyncope but no syncope. Feels her heart racing. In a separate episode, she was walking on the with son and felt like she was going to pass out, approximately 1 mo ago. She was walking into church and noticed it happen again. Feels this sensation about 1 x per week. She also notes chest heaviness with and without presyncope, with emotional stress - heart racing, sits for 10 -15 minutes and feels better. No medications used to relieve symptoms. Chest heaviness is substernal. Not worsened by deep breathing.   fhx - mom had episodes like this and has a pacemaker she's 96 now.  The patient does not have symptoms concerning for COVID-19 infection  (fever, chills, cough, or new shortness of breath).    Past Medical History:  Diagnosis Date  . Breast cancer ([place]) 2017   right breast  . Breast cancer of lower-inner quadrant of right female breast ([place]) 08/08/2015  . Colon polyps   . DDD (degenerative disc disease), cervical   . Degenerative joint disease   . Diabetes mellitus without complication ([place])   . Diverticulosis   . Gallstones   . GERD (gastroesophageal reflux disease)   . Headache(784.0)    migraine like per patient  . Hypertension   . Low back pain    dr phillips, pain management  . Osteoarthritis   . Personal history of chemotherapy 2017  . Personal history of radiation therapy 2017   Past Surgical History:  Procedure Laterality Date  . CHOLECYSTECTOMY  10/28/2011   Procedure: LAPAROSCOPIC CHOLECYSTECTOMY WITH INTRAOPERATIVE CHOLANGIOGRAM;  Surgeon: Earnstine Regal, MD;  Location: [facility];  Service: General;  Laterality: N/A;  . COLONOSCOPY WITH ESOPHAGOGASTRODUODENOSCOPY (EGD)  02/03/2013   with Propofol  . EXAM UNDER ANESTHESIA WITH MANIPULATION OF KNEE Right 05/30/2003  . EXAM UNDER ANESTHESIA WITH MANIPULATION OF KNEE Left 12/27/2002  . GANGLION CYST EXCISION Right    right wrist  . KNEE ARTHROSCOPY Bilateral 01/02/2000  . KNEE ARTHROSCOPY Right   . LAPAROSCOPIC VAGINAL HYSTERECTOMY WITH SALPINGO OOPHORECTOMY Bilateral 07/13/2006  . LIPOMA EXCISION Right 02/21/2008   hip  . MASS EXCISION  08/24/2012   Procedure: EXCISION MASS;  Surgeon: Earnstine Regal, MD;  Location: [facility];  Service: General;  Laterality: Left;  excisie soft tissue masses left shoulder(back) & left upper arm  . MASS EXCISION Left 01/24/2014   Procedure: EXCISION SOFT TISSUE MASSES LOWER LEFT BACK;  Surgeon: Earnstine Regal, MD;  Location: [place];  Service: General;  Laterality: Left;  Marland Kitchen MASS EXCISION Right 04/30/2016   Procedure: EXCISION OF SKIN RIGHT CHEST WALL;  Surgeon: Autumn Messing III, MD;  Location: [place];  Service: General;  Laterality: Right;  EXCISION OF SKIN RIGHT CHEST WALL  . MASTECTOMY Right 2017  . MASTECTOMY W/ SENTINEL NODE BIOPSY Right 10/01/2015   Procedure: RIGHT MASTECTOMY WITH SENTINEL LYMPH NODE BIOPSY;  Surgeon: Autumn Messing III, MD;  Location: [place];  Service: General;  Laterality: Right;  . PORT-A-CATH REMOVAL Left 04/30/2016   Procedure: REMOVAL PORT-A-CATH;  Surgeon: Autumn Messing III, MD;  Location: [place];  Service: General;  Laterality: Left;  REMOVAL PORT-A-CATH  . PORTACATH PLACEMENT Left 11/01/2015   Procedure: INSERTION PORT-A-CATH;  Surgeon: Autumn Messing III, MD;  Location: [place];  Service: General;  Laterality: Left;  . REPLACEMENT UNICONDYLAR JOINT KNEE Left 04/20/2001  . REVISION TOTAL KNEE ARTHROPLASTY Right 06/18/2004  . REVISION TOTAL KNEE ARTHROPLASTY Left 11/15/2002  . REVISION TOTAL KNEE ARTHROPLASTY Left 10/12/2001  . REVISION TOTAL KNEE ARTHROPLASTY Right 07/09/2015  . TONSILLECTOMY    . TOTAL KNEE ARTHROPLASTY Right 04/25/2003  . TOTAL KNEE ARTHROPLASTY Left 06/25/2009     Current Meds  Medication Sig  . amitriptyline (ELAVIL) 25 MG tablet Take 75 mg by mouth at bedtime.   Marland Kitchen amLODipine (NORVASC) 5 MG tablet Take 5 mg by mouth daily.  Marland Kitchen KLOR-CON M20 20 MEQ tablet TAKE 1 TABLET BY MOUTH EVERY DAY  . lisinopril (ZESTRIL) 10 MG tablet Take 1 tablet (10 mg total) by mouth daily.  . metFORMIN (GLUCOPHAGE) 500 MG tablet TAKE 1 TABLET BY MOUTH TWICE A DAY WITH A MEAL (Patient taking differently: Take 500 mg by mouth daily with breakfast. )  . methadone (DOLOPHINE) 5 MG tablet Take 5 mg by mouth every 8 (eight) hours as needed.  . metoprolol tartrate (LOPRESSOR) 50 MG tablet Take 1 tablet (50 mg total) by mouth 2 (two) times daily. (Patient taking differently: Take 50 mg by mouth daily. )  . ONE TOUCH ULTRA TEST test strip TEST ONCE DAILY  . ONETOUCH DELICA LANCETS FINE MISC USE TO TEST ONCE DAILY DX CODE E11.9  .  oxyCODONE (ROXICODONE) 15 MG immediate release tablet Take 15 mg by mouth every 4 (four) hours as needed for pain.  . tamoxifen (NOLVADEX) 20 MG tablet Take 20 mg by mouth daily.  Marland Kitchen zolpidem (AMBIEN) 10 MG tablet TAKE 1 TABLET BY MOUTH AT BEDTIME AS NEEDED FOR INSOMNIA     Allergies:   Codeine, Latex, Sulfonamide derivatives, Gabapentin, and Lyrica [pregabalin]   Social History   Tobacco Use  . Smoking status: Current Every Day Smoker    Packs/day: 0.25    Years: 20.00    Pack years: 5.00    Types: Cigarettes  . Smokeless tobacco: Former Systems developer    Quit date: 09/30/2015  . Tobacco comment: smokes about 4 cigs/day; working on quitting - 08/22/16 gwd  Substance Use Topics  . Alcohol use: No    Alcohol/week: 0.0 standard drinks  . Drug use: No     Family Hx: The patient's family history includes Colon cancer in her maternal grandmother; Crohn's disease in an other family  member; Heart disease in her father; Hypertension in her brother and sister; Kidney cancer in her sister; Prostate cancer in her father; Stomach cancer in her mother. There is no history of Esophageal cancer.  ROS:   Please see the history of present illness.     All other systems reviewed and are negative.   Prior CV studies:   The following studies were reviewed today:    Labs/Other Tests and Data Reviewed:    EKG:  An ECG dated 04/19/2019 was personally reviewed today and demonstrated:  NSR, nonspecific ST-T wave abnormality  Recent Labs: 10/25/2018: ALT 10 04/19/2019: BUN 10; Creatinine, Ser 0.93; Hemoglobin 12.9; Platelets 237; Potassium 3.2; Sodium 140   Recent Lipid Panel Lab Results  Component Value Date/Time   CHOL 276 (H) 12/09/2017 10:25 AM   TRIG 212.0 (H) 12/09/2017 10:25 AM   HDL 53.60 12/09/2017 10:25 AM   CHOLHDL 5 12/09/2017 10:25 AM   LDLCALC 135 (H) 12/02/2013 11:16 AM   LDLDIRECT 183.0 12/09/2017 10:25 AM    Wt Readings from Last 3 Encounters:  02/07/19 253 lb (114.8 kg)  11/04/18 253  lb (114.8 kg)  10/26/18 248 lb 6 oz (112.7 kg)     Objective:    Vital Signs:  BP (!) 151/99   Pulse (!) 101    VITAL SIGNS:  reviewed GEN:  no acute distress EYES:  sclerae anicteric, EOMI - Extraocular Movements Intact RESPIRATORY:  normal respiratory effort, symmetric expansion CARDIOVASCULAR:  no peripheral edema SKIN:  no rash, lesions or ulcers. MUSCULOSKELETAL:  no obvious deformities. NEURO:  alert and oriented x 3, no obvious focal deficit PSYCH:  normal affect  ASSESSMENT & PLAN:    1. Pre-syncope   2. Chest pain, unspecified type   3. Essential (primary) hypertension   4. History of chemotherapy    Presyncope - will obtain an echo and event monitor to ensure palpitations and presyncope are not from a structural or rhythm source.   Chest pain - will obtain a myoview with concerns of chest heaviness. Risk factors include history of HTN and obesity.   COVID-19 Education: The signs and symptoms of COVID-19 were discussed with the patient and how to seek care for testing (follow up with PCP or arrange E-visit).  The importance of social distancing was discussed today.  Time:   Today, I have spent 30 minutes with the patient with telehealth technology discussing the above problems.     Medication Adjustments/Labs and Tests Ordered: Current medicines are reviewed at length with the patient today.  Concerns regarding medicines are outlined above.   Tests Ordered: Orders Placed This Encounter  Procedures  . MYOCARDIAL PERFUSION IMAGING  . CARDIAC EVENT MONITOR  . ECHOCARDIOGRAM COMPLETE    Medication Changes: No orders of the defined types were placed in this encounter.   Follow Up: 6 weeks  Signed, Elouise Munroe, MD  04/27/2019 2:59 PM    [place]

## 2019-04-29 ENCOUNTER — Telehealth (HOSPITAL_COMMUNITY): Payer: Self-pay

## 2019-04-29 NOTE — Telephone Encounter (Signed)
Encounter complete. 

## 2019-05-03 ENCOUNTER — Ambulatory Visit (INDEPENDENT_AMBULATORY_CARE_PROVIDER_SITE_OTHER): Payer: 59

## 2019-05-03 DIAGNOSIS — R55 Syncope and collapse: Secondary | ICD-10-CM | POA: Diagnosis not present

## 2019-05-04 ENCOUNTER — Ambulatory Visit (HOSPITAL_COMMUNITY): Payer: 59

## 2019-05-05 ENCOUNTER — Ambulatory Visit (HOSPITAL_COMMUNITY): Payer: 59

## 2019-05-10 ENCOUNTER — Telehealth (HOSPITAL_COMMUNITY): Payer: Self-pay | Admitting: *Deleted

## 2019-05-10 ENCOUNTER — Ambulatory Visit (HOSPITAL_COMMUNITY): Payer: 59 | Attending: Cardiology

## 2019-05-10 ENCOUNTER — Other Ambulatory Visit: Payer: Self-pay

## 2019-05-10 DIAGNOSIS — R55 Syncope and collapse: Secondary | ICD-10-CM | POA: Insufficient documentation

## 2019-05-10 DIAGNOSIS — Z9221 Personal history of antineoplastic chemotherapy: Secondary | ICD-10-CM | POA: Insufficient documentation

## 2019-05-10 NOTE — Telephone Encounter (Signed)
Close encounter 

## 2019-05-11 ENCOUNTER — Telehealth (HOSPITAL_COMMUNITY): Payer: Self-pay | Admitting: *Deleted

## 2019-05-11 NOTE — Telephone Encounter (Signed)
Close encounter 

## 2019-05-12 ENCOUNTER — Ambulatory Visit: Payer: Self-pay | Admitting: *Deleted

## 2019-05-12 NOTE — Telephone Encounter (Signed)
Pt offered visit today with another provider but declined, she wishes to see her PCP. Pt scheduled for tomorrow.

## 2019-05-12 NOTE — Telephone Encounter (Signed)
Patient woke up this morning- she has swelling in her tongue- she took 2 Benadryl and reports the swelling improved. Patient reports she has swelling in the R jaw and neck "like a gland or something". Patient reports pain when she swallows and pressure in her R ear. Patient is not in distress- she is working at her church presently. Call to office for appointment  Reason for Disposition . Earache also present  Answer Assessment - Initial Assessment Questions 1. LOCATION: "Where is the swollen node located?" "Is the matching node on the other side of the body also swollen?"      R under jawline 2. SIZE: "How big is the node?" (Inches or centimeters) (or compare to common objects such as pea, bean, marble, golf ball)      Gumball size 3. ONSET: "When did the swelling start?"      This morning 4. NECK NODES: "Is there a sore throat, runny nose or other symptoms of a cold?"      Sore throat on the right 5. GROIN OR ARMPIT NODES: "Is there a sore, scratch, cut or painful red area on that arm or leg?"      no 6. FEVER: "Do you have a fever?" If so, ask: "What is it, how was it measured, and when did it start?"      No-98.1 7. CAUSE: "What do you think is causing the swollen lymph nodes?"     None known 8. OTHER SYMPTOMS: "Do you have any other symptoms?"     Tongue was swollen this morning 9. PREGNANCY: "Is there any chance you are pregnant?" "When was your last menstrual period?"     n/a  Protocols used: SORE THROAT-A-AH, LYMPH NODES [place]

## 2019-05-13 ENCOUNTER — Other Ambulatory Visit: Payer: Self-pay

## 2019-05-13 ENCOUNTER — Telehealth (INDEPENDENT_AMBULATORY_CARE_PROVIDER_SITE_OTHER): Payer: 59 | Admitting: Family Medicine

## 2019-05-13 ENCOUNTER — Encounter: Payer: Self-pay | Admitting: Family Medicine

## 2019-05-13 DIAGNOSIS — J039 Acute tonsillitis, unspecified: Secondary | ICD-10-CM | POA: Diagnosis not present

## 2019-05-13 MED ORDER — CEPHALEXIN 500 MG PO CAPS: 500.0000 mg | ORAL_CAPSULE | Freq: Three times a day (TID) | ORAL | 0 refills | Status: AC

## 2019-05-13 NOTE — Progress Notes (Signed)
Virtual Visit via Video Note  I connected with the patient on 05/13/19 at  8:30 AM EDT by a video enabled telemedicine application and verified that I am speaking with the correct person using two identifiers.  Location patient: home Location provider:work or home office Persons participating in the virtual visit: patient, provider  I discussed the limitations of evaluation and management by telemedicine and the availability of in person appointments. The patient expressed understanding and agreed to proceed.   HPI: Here for 2 days of a bad ST on the right side, right ear pain, and tender swollen lymph nodes under the right jaw. No headache or cough or SOB or NVD.    ROS: See pertinent positives and negatives per HPI.  Past Medical History:  Diagnosis Date  . Breast cancer ([place]) 2017   right breast  . Breast cancer of lower-inner quadrant of right female breast ([place]) 08/08/2015  . Colon polyps   . DDD (degenerative disc disease), cervical   . Degenerative joint disease   . Diabetes mellitus without complication ([place])   . Diverticulosis   . Gallstones   . GERD (gastroesophageal reflux disease)   . Headache(784.0)    migraine like per patient  . Hypertension   . Low back pain    dr phillips, pain management  . Osteoarthritis   . Personal history of chemotherapy 2017  . Personal history of radiation therapy 2017    Past Surgical History:  Procedure Laterality Date  . CHOLECYSTECTOMY  10/28/2011   Procedure: LAPAROSCOPIC CHOLECYSTECTOMY WITH INTRAOPERATIVE CHOLANGIOGRAM;  Surgeon: Earnstine Regal, MD;  [facility];  Service: General;  Laterality: N/A;  . COLONOSCOPY WITH ESOPHAGOGASTRODUODENOSCOPY (EGD)  02/03/2013   with Propofol  . EXAM UNDER ANESTHESIA WITH MANIPULATION OF KNEE Right 05/30/2003  . EXAM UNDER ANESTHESIA WITH MANIPULATION OF KNEE Left 12/27/2002  . GANGLION CYST EXCISION Right    right wrist  . KNEE ARTHROSCOPY Bilateral 01/02/2000  . KNEE ARTHROSCOPY  Right   . LAPAROSCOPIC VAGINAL HYSTERECTOMY WITH SALPINGO OOPHORECTOMY Bilateral 07/13/2006  . LIPOMA EXCISION Right 02/21/2008   hip  . MASS EXCISION  08/24/2012   Procedure: EXCISION MASS;  Surgeon: Earnstine Regal, MD;  Location: [place];  Service: General;  Laterality: Left;  excisie soft tissue masses left shoulder(back) & left upper arm  . MASS EXCISION Left 01/24/2014   Procedure: EXCISION SOFT TISSUE MASSES LOWER LEFT BACK;  Surgeon: Earnstine Regal, MD;  Location: [place];  Service: General;  Laterality: Left;  Marland Kitchen MASS EXCISION Right 04/30/2016   Procedure: EXCISION OF SKIN RIGHT CHEST WALL;  Surgeon: Autumn Messing III, MD;  Location: [place];  Service: General;  Laterality: Right;  EXCISION OF SKIN RIGHT CHEST WALL  . MASTECTOMY Right 2017  . MASTECTOMY W/ SENTINEL NODE BIOPSY Right 10/01/2015   Procedure: RIGHT MASTECTOMY WITH SENTINEL LYMPH NODE BIOPSY;  Surgeon: Autumn Messing III, MD;  Location: [place];  Service: General;  Laterality: Right;  . PORT-A-CATH REMOVAL Left 04/30/2016   Procedure: REMOVAL PORT-A-CATH;  Surgeon: Autumn Messing III, MD;  Location: [place];  Service: General;  Laterality: Left;  REMOVAL PORT-A-CATH  . PORTACATH PLACEMENT Left 11/01/2015   Procedure: INSERTION PORT-A-CATH;  Surgeon: Autumn Messing III, MD;  Location: [place];  Service: General;  Laterality: Left;  . REPLACEMENT UNICONDYLAR JOINT KNEE Left 04/20/2001  . REVISION TOTAL KNEE ARTHROPLASTY Right 06/18/2004  . REVISION TOTAL KNEE ARTHROPLASTY Left 11/15/2002  .  REVISION TOTAL KNEE ARTHROPLASTY Left 10/12/2001  . REVISION TOTAL KNEE ARTHROPLASTY Right 07/09/2015  . TONSILLECTOMY    . TOTAL KNEE ARTHROPLASTY Right 04/25/2003  . TOTAL KNEE ARTHROPLASTY Left 06/25/2009    Family History  Problem Relation Age of Onset  . Stomach cancer Mother   . Heart disease Father   . Prostate cancer Father   . Kidney cancer Sister        one out of  the four  . Hypertension Sister   . Hypertension Brother   . Crohn's disease Other        nephew  . Colon cancer Maternal Grandmother   . Esophageal cancer Neg Hx      Current Outpatient Medications:  .  amitriptyline (ELAVIL) 25 MG tablet, Take 75 mg by mouth at bedtime. , Disp: , Rfl:  .  amLODipine (NORVASC) 5 MG tablet, Take 5 mg by mouth daily., Disp: , Rfl:  .  cephALEXin (KEFLEX) 500 MG capsule, Take 1 capsule (500 mg total) by mouth 3 (three) times daily for 10 days., Disp: 30 capsule, Rfl: 0 .  KLOR-CON M20 20 MEQ tablet, TAKE 1 TABLET BY MOUTH EVERY DAY, Disp: 30 tablet, Rfl: 2 .  lisinopril (ZESTRIL) 10 MG tablet, Take 1 tablet (10 mg total) by mouth daily., Disp: 90 tablet, Rfl: 3 .  metFORMIN (GLUCOPHAGE) 500 MG tablet, TAKE 1 TABLET BY MOUTH TWICE A DAY WITH A MEAL (Patient taking differently: Take 500 mg by mouth daily with breakfast. ), Disp: 60 tablet, Rfl: 8 .  methadone (DOLOPHINE) 5 MG tablet, Take 5 mg by mouth every 8 (eight) hours as needed., Disp: , Rfl:  .  metoprolol tartrate (LOPRESSOR) 50 MG tablet, Take 1 tablet (50 mg total) by mouth 2 (two) times daily. (Patient taking differently: Take 50 mg by mouth daily. ), Disp: 180 tablet, Rfl: 3 .  omeprazole (PRILOSEC) 40 MG capsule, Take 1 capsule (40 mg total) by mouth 2 (two) times daily. (Patient taking differently: Take 40 mg by mouth daily. ), Disp: 180 capsule, Rfl: 3 .  ONE TOUCH ULTRA TEST test strip, TEST ONCE DAILY, Disp: 25 each, Rfl: 3 .  ONETOUCH DELICA LANCETS FINE MISC, USE TO TEST ONCE DAILY DX CODE E11.9, Disp: 100 each, Rfl: 0 .  oxyCODONE (ROXICODONE) 15 MG immediate release tablet, Take 15 mg by mouth every 4 (four) hours as needed for pain., Disp: , Rfl:  .  tamoxifen (NOLVADEX) 20 MG tablet, Take 20 mg by mouth daily., Disp: , Rfl:  .  zolpidem (AMBIEN) 10 MG tablet, TAKE 1 TABLET BY MOUTH AT BEDTIME AS NEEDED FOR INSOMNIA, Disp: 30 tablet, Rfl: 5  EXAM:  VITALS per patient if  applicable:  GENERAL: alert, oriented, appears well and in no acute distress  HEENT: atraumatic, conjunttiva clear, no obvious abnormalities on inspection of external nose and ears  NECK: normal movements of the head and neck  LUNGS: on inspection no signs of respiratory distress, breathing rate appears normal, no obvious gross SOB, gasping or wheezing  CV: no obvious cyanosis  MS: moves all visible extremities without noticeable abnormality  PSYCH/NEURO: pleasant and cooperative, no obvious depression or anxiety, speech and thought processing grossly intact  ASSESSMENT AND PLAN: Tonsillitis, treat with Keflex. Use Ibuprofen prn.  Alysia Penna, MD  Discussed the following assessment and plan:  No diagnosis found.     I discussed the assessment and treatment plan with the patient. The patient was provided an opportunity to ask questions and  all were answered. The patient agreed with the plan and demonstrated an understanding of the instructions.   The patient was advised to call back or seek an in-person evaluation if the symptoms worsen or if the condition fails to improve as anticipated.

## 2019-05-17 ENCOUNTER — Ambulatory Visit (HOSPITAL_COMMUNITY)
Admission: RE | Admit: 2019-05-17 | Discharge: 2019-05-17 | Disposition: A | Discharge status: Home / Self Care | Payer: 59 | Source: Ambulatory Visit | Attending: Internal Medicine | Admitting: Internal Medicine

## 2019-05-17 ENCOUNTER — Other Ambulatory Visit: Payer: Self-pay

## 2019-05-17 DIAGNOSIS — R079 Chest pain, unspecified: Secondary | ICD-10-CM | POA: Diagnosis not present

## 2019-05-17 MED ORDER — TECHNETIUM TC 99M TETROFOSMIN IV KIT
28.0000 | PACK | Freq: Once | INTRAVENOUS | Status: AC | PRN
  Administered 2019-05-17: 28 via INTRAVENOUS
  Filled 2019-05-17: qty 28

## 2019-05-17 MED ORDER — REGADENOSON 0.4 MG/5ML IV SOLN
0.4000 mg | Freq: Once | INTRAVENOUS | Status: AC
  Administered 2019-05-17: 0.4 mg via INTRAVENOUS

## 2019-05-18 ENCOUNTER — Ambulatory Visit (HOSPITAL_COMMUNITY)
Admission: RE | Admit: 2019-05-18 | Discharge: 2019-05-18 | Disposition: A | Discharge status: Home / Self Care | Payer: 59 | Source: Ambulatory Visit | Attending: Cardiovascular Disease | Admitting: Cardiovascular Disease

## 2019-05-18 LAB — MYOCARDIAL PERFUSION IMAGING
LV dias vol: 129 mL (ref 46–106)
LV sys vol: 73 mL
Peak HR: 88 {beats}/min
Rest HR: 62 {beats}/min
SDS: 4
SRS: 3
SSS: 7
TID: 1.04

## 2019-05-18 MED ORDER — TECHNETIUM TC 99M TETROFOSMIN IV KIT
29.0000 | PACK | Freq: Once | INTRAVENOUS | Status: AC | PRN
  Administered 2019-05-18: 29 via INTRAVENOUS

## 2019-05-21 ENCOUNTER — Other Ambulatory Visit: Payer: Self-pay | Admitting: Family Medicine

## 2019-05-23 NOTE — Telephone Encounter (Signed)
Last filled 11/15/2018 Last OV 01/25/2019  Ok to fill?

## 2019-05-27 ENCOUNTER — Encounter: Payer: Self-pay | Admitting: Adult Health

## 2019-05-30 NOTE — Progress Notes (Signed)
Virtual Visit via Video Note   This visit type was conducted due to national recommendations for restrictions regarding the COVID-19 Pandemic (e.g. social distancing) in an effort to limit this patient's exposure and mitigate transmission in our community.  Due to her co-morbid illnesses, this patient is at least at moderate risk for complications without adequate follow up.  This format is felt to be most appropriate for this patient at this time.  All issues noted in this document were discussed and addressed.  A limited physical exam was performed with this format.  Please refer to the patient's chart for her consent to telehealth for [facility].   Date:  05/31/2019   ID:  [name], DOB 1960/10/02, MRN [id number]  Patient Location: Home Provider Location: Home  PCP:  Laurey Morale, MD  Cardiologist: Dr.Acharya  Electrophysiologist:  None   Evaluation Performed:  Follow-Up Visit  Chief Complaint:  Follow up  History of Present Illness:    [name] is a 58 y.o. female we are following for ongoing assessment and management of hypertension, with history of presyncope, chest heaviness and racing heart rate.  The patient has a history of right breast cancer with chemotherapy and radiation, diabetes, and GERD.  She was seen last in the office on 04/27/2019 by Dr.Acharya   The patient was ordered an echocardiogram, event monitor, and Myoview stress test due to multiple cardiovascular risk factors.  Echocardiogram dated 05/10/2019 revealed normal LVEF of 55% to 60%, global longitudinal strain was normal at 17.2%.  She had mild diastolic dysfunction , there were no valvular abnormalities.    Nuclear medicine stress test dated 05/18/2019 revealed reduced LVEF, inconclusive EKG due to resting ST depression.  There was a medium size fixed defect present in the mid anterior, mid anterior septal, apical anterior, and apical septal location.  The findings were consistent with prior  myocardial infarction versus artifact.  It was suggested that it was unlikely a fixed perfusion due to artifact and found to be a low risk study. [name] has just taken off her cardiac monitor and is due reported in the mail in the morning.   [name] is without complaints of a cardiac etiology today.  She believes now that all of her flushing and diaphoresis is related to her tamoxifen.  She states that when she takes it within an hour she can have some flushing and diaphoresis.  But also can happen at other times as well.  Until we see the cardiac monitor we will not be able to make recommendations.  She is otherwise staying very busy, working in her home and with her community.  She denies any exertional angina or weakness.  The patient does not have symptoms concerning for COVID-19 infection (fever, chills, cough, or new shortness of breath).    Past Medical History:  Diagnosis Date  . Breast cancer ([place]) 2017   right breast  . Breast cancer of lower-inner quadrant of right female breast ([place]) 08/08/2015  . Colon polyps   . DDD (degenerative disc disease), cervical   . Degenerative joint disease   . Diabetes mellitus without complication ([place])   . Diverticulosis   . Gallstones   . GERD (gastroesophageal reflux disease)   . Headache(784.0)    migraine like per patient  . Hypertension   . Low back pain    dr phillips, pain management  . Osteoarthritis   . Personal history of chemotherapy 2017  . Personal history of radiation  therapy 2017   Past Surgical History:  Procedure Laterality Date  . CHOLECYSTECTOMY  10/28/2011   Procedure: LAPAROSCOPIC CHOLECYSTECTOMY WITH INTRAOPERATIVE CHOLANGIOGRAM;  Surgeon: Earnstine Regal, MD;  Location: [facility];  Service: General;  Laterality: N/A;  . COLONOSCOPY WITH ESOPHAGOGASTRODUODENOSCOPY (EGD)  02/03/2013   with Propofol  . EXAM UNDER ANESTHESIA WITH MANIPULATION OF KNEE Right 05/30/2003  . EXAM UNDER ANESTHESIA WITH MANIPULATION OF KNEE  Left 12/27/2002  . GANGLION CYST EXCISION Right    right wrist  . KNEE ARTHROSCOPY Bilateral 01/02/2000  . KNEE ARTHROSCOPY Right   . LAPAROSCOPIC VAGINAL HYSTERECTOMY WITH SALPINGO OOPHORECTOMY Bilateral 07/13/2006  . LIPOMA EXCISION Right 02/21/2008   hip  . MASS EXCISION  08/24/2012   Procedure: EXCISION MASS;  Surgeon: Earnstine Regal, MD;  Location: [place];  Service: General;  Laterality: Left;  excisie soft tissue masses left shoulder(back) & left upper arm  . MASS EXCISION Left 01/24/2014   Procedure: EXCISION SOFT TISSUE MASSES LOWER LEFT BACK;  Surgeon: Earnstine Regal, MD;  Location: [place];  Service: General;  Laterality: Left;  Marland Kitchen MASS EXCISION Right 04/30/2016   Procedure: EXCISION OF SKIN RIGHT CHEST WALL;  Surgeon: Autumn Messing III, MD;  Location: [place];  Service: General;  Laterality: Right;  EXCISION OF SKIN RIGHT CHEST WALL  . MASTECTOMY Right 2017  . MASTECTOMY W/ SENTINEL NODE BIOPSY Right 10/01/2015   Procedure: RIGHT MASTECTOMY WITH SENTINEL LYMPH NODE BIOPSY;  Surgeon: Autumn Messing III, MD;  Location: [place];  Service: General;  Laterality: Right;  . PORT-A-CATH REMOVAL Left 04/30/2016   Procedure: REMOVAL PORT-A-CATH;  Surgeon: Autumn Messing III, MD;  Location: [place];  Service: General;  Laterality: Left;  REMOVAL PORT-A-CATH  . PORTACATH PLACEMENT Left 11/01/2015   Procedure: INSERTION PORT-A-CATH;  Surgeon: Autumn Messing III, MD;  Location: [place];  Service: General;  Laterality: Left;  . REPLACEMENT UNICONDYLAR JOINT KNEE Left 04/20/2001  . REVISION TOTAL KNEE ARTHROPLASTY Right 06/18/2004  . REVISION TOTAL KNEE ARTHROPLASTY Left 11/15/2002  . REVISION TOTAL KNEE ARTHROPLASTY Left 10/12/2001  . REVISION TOTAL KNEE ARTHROPLASTY Right 07/09/2015  . TONSILLECTOMY    . TOTAL KNEE ARTHROPLASTY Right 04/25/2003  . TOTAL KNEE ARTHROPLASTY Left 06/25/2009     Current Meds  Medication Sig  .  amitriptyline (ELAVIL) 100 MG tablet Take 100 mg by mouth at bedtime.  Marland Kitchen amLODipine (NORVASC) 5 MG tablet Take 5 mg by mouth daily.  Marland Kitchen KLOR-CON M20 20 MEQ tablet TAKE 1 TABLET BY MOUTH EVERY DAY  . lisinopril (ZESTRIL) 10 MG tablet Take 1 tablet (10 mg total) by mouth daily.  . metFORMIN (GLUCOPHAGE-XR) 500 MG 24 hr tablet Take 500 mg by mouth daily with breakfast.  . metoprolol tartrate (LOPRESSOR) 50 MG tablet Take 1 tablet (50 mg total) by mouth 2 (two) times daily.  Marland Kitchen omeprazole (PRILOSEC) 40 MG capsule Take 40 mg by mouth daily.  . ONE TOUCH ULTRA TEST test strip TEST ONCE DAILY  . ONETOUCH DELICA LANCETS FINE MISC USE TO TEST ONCE DAILY DX CODE E11.9  . oxyCODONE (ROXICODONE) 15 MG immediate release tablet Take 15 mg by mouth every 4 (four) hours as needed for pain.  . tamoxifen (NOLVADEX) 20 MG tablet Take 20 mg by mouth daily.  Marland Kitchen zolpidem (AMBIEN) 10 MG tablet TAKE 1 TABLET BY MOUTH AT BEDTIME AS NEEDED FOR INSOMNIA  . [DISCONTINUED] amitriptyline (ELAVIL) 25 MG tablet Take 75 mg by mouth  at bedtime.   . [DISCONTINUED] methadone (DOLOPHINE) 5 MG tablet Take 5 mg by mouth every 8 (eight) hours as needed.  . [DISCONTINUED] omeprazole (PRILOSEC) 40 MG capsule Take 1 capsule (40 mg total) by mouth 2 (two) times daily. (Patient taking differently: Take 40 mg by mouth daily. )     Allergies:   Codeine, Latex, Sulfonamide derivatives, Gabapentin, and Lyrica [pregabalin]   Social History   Tobacco Use  . Smoking status: Current Every Day Smoker    Packs/day: 0.25    Years: 20.00    Pack years: 5.00    Types: Cigarettes  . Smokeless tobacco: Former Systems developer    Quit date: 09/30/2015  . Tobacco comment: smokes about 4 cigs/day; working on quitting - 08/22/16 gwd  Substance Use Topics  . Alcohol use: No    Alcohol/week: 0.0 standard drinks  . Drug use: No     Family Hx: The patient's family history includes Colon cancer in her maternal grandmother; Crohn's disease in an other family member;  Heart disease in her father; Hypertension in her brother and sister; Kidney cancer in her sister; Prostate cancer in her father; Stomach cancer in her mother. There is no history of Esophageal cancer.  ROS:   Please see the history of present illness.    All other systems reviewed and are negative.   Prior CV studies:   The following studies were reviewed today: Echocardiogram 05/10/2019  1. The average left ventricular global longitudinal strain is normal at -17.2 %.  2. The left ventricle has normal systolic function, with an ejection fraction of 55-60%. The cavity size was moderately dilated. Left ventricular diastolic Doppler parameters are consistent with pseudonormalization.  3. The right ventricle has normal systolic function. The cavity was normal. There is no increase in right ventricular wall thickness. Right ventricular systolic pressure could not be assessed.  4. The aortic valve is tricuspid. Mild sclerosis of the aortic valve.  5. The aorta is normal unless otherwise noted.  6. The interatrial septum appears to be lipomatous.  7. There is right bowing of the interatrial septum, suggestive of elevated left atrial pressure.  8. Left atrial size was mildly dilated.  NM Stress Test: 05/18/2019   Nuclear stress EF: 43%.  The left ventricular ejection fraction is moderately decreased (30-44%).  Inconclusive EKG due to resting ST depression  There is a medium sized fixed defect present in the mid anterior, mid anteroseptal, apical anterior and apical septal location.  Findings consistent with prior myocardial infarction versus artifact. Recent normal wall motion on TTE would suggest likely fixed perfusion defect represents artifact.  This is a low risk study.     Labs/Other Tests and Data Reviewed:    EKG:  No ECG reviewed.  Recent Labs: 10/25/2018: ALT 10 04/19/2019: BUN 10; Creatinine, Ser 0.93; Hemoglobin 12.9; Platelets 237; Potassium 3.2; Sodium 140   Recent Lipid  Panel Lab Results  Component Value Date/Time   CHOL 276 (H) 12/09/2017 10:25 AM   TRIG 212.0 (H) 12/09/2017 10:25 AM   HDL 53.60 12/09/2017 10:25 AM   CHOLHDL 5 12/09/2017 10:25 AM   LDLCALC 135 (H) 12/02/2013 11:16 AM   LDLDIRECT 183.0 12/09/2017 10:25 AM    Wt Readings from Last 3 Encounters:  05/31/19 250 lb (113.4 kg)  05/17/19 253 lb (114.8 kg)  02/07/19 253 lb (114.8 kg)     Objective:    Vital Signs:  BP (!) 153/94   Pulse 86   Ht 5\' 7"  (1.702 m)  Wt 250 lb (113.4 kg)   BMI 39.16 kg/m    VITAL SIGNS:  reviewed GEN:  no acute distress RESPIRATORY:  normal respiratory effort, symmetric expansion NEURO:  alert and oriented x 3, no obvious focal deficit PSYCH:  normal affect  ASSESSMENT & PLAN:    1.  Chest discomfort: Nuclear medicine stress test was low risk, echocardiogram revealed normal LV function with some diastolic dysfunction.  Longitudinal strain was normal.  Would not move forward with any further cardiac testing unless symptoms worsen, at which time would recommend cardiac catheterization.  For now continue current medical therapy.  I have discussed her test results with her and answered questions.  2.  Palpitations: Patient has just turned in her cardiac monitor.  Once it is downloaded and evaluated we will make further recommendations if necessary.  Will await results.  3.  Diaphoresis: She continues to have episodes of diaphoresis which she believes may be related to tamoxifen.  She will discuss this further with her oncologist.  She had been on another chemotherapy drug which caused her to have alopecia so she was changed to tamoxifen.  I have asked her to stay hydrated.  She will need to have follow-up BMET by PCP in the next several months as she also has had some issues with hypokalemia.  With frequent diaphoresis this will need to be monitored closely.  She remains on p.o. potassium supplement.  4.  Breast cancer: Continues ongoing management by  oncology.  COVID-19 Education: The signs and symptoms of COVID-19 were discussed with the patient and how to seek care for testing (follow up with PCP or arrange E-visit).  The importance of social distancing was discussed today.  Time:   Today, I have spent 15 minutes with the patient with telehealth technology discussing the above problems.     Medication Adjustments/Labs and Tests Ordered: Current medicines are reviewed at length with the patient today.  Concerns regarding medicines are outlined above.   Tests Ordered: No orders of the defined types were placed in this encounter.   Medication Changes: No orders of the defined types were placed in this encounter.   Disposition:  Follow up 6 months unless cardiac monitor is abnormal.  Signed, Phill Myron. West Pugh, ANP, AACC  05/31/2019 2:18 PM    [facility]

## 2019-05-31 ENCOUNTER — Encounter: Payer: Self-pay | Admitting: Adult Health

## 2019-05-31 ENCOUNTER — Telehealth (INDEPENDENT_AMBULATORY_CARE_PROVIDER_SITE_OTHER): Payer: 59 | Admitting: Adult Health

## 2019-05-31 VITALS — BP 153/94 | HR 86 | Ht 67.0 in | Wt 250.0 lb

## 2019-05-31 DIAGNOSIS — R55 Syncope and collapse: Secondary | ICD-10-CM

## 2019-05-31 DIAGNOSIS — I10 Essential (primary) hypertension: Secondary | ICD-10-CM | POA: Diagnosis not present

## 2019-05-31 DIAGNOSIS — R61 Generalized hyperhidrosis: Secondary | ICD-10-CM

## 2019-05-31 DIAGNOSIS — R002 Palpitations: Secondary | ICD-10-CM

## 2019-05-31 NOTE — Patient Instructions (Signed)
Medication Instructions:  Continue current medications  If you need a refill on your cardiac medications before your next appointment, please call your pharmacy.  Labwork: None Ordered   Testing/Procedures: None Ordered  Follow-Up: You will need a follow up appointment in 6 months.  Please call our office 2 months in advance to schedule this appointment.  You may see Dr Margaretann Loveless or one of the following Advanced Practice Providers on your designated Care Team:   Rosaria Ferries, PA-C . Jory Sims, DNP, ANP     At [facility], you and your health needs are our priority.  As part of our continuing mission to provide you with exceptional heart care, we have created designated Provider Care Teams.  These Care Teams include your primary Cardiologist (physician) and Advanced Practice Providers (APPs -  Physician Assistants and Nurse Practitioners) who all work together to provide you with the care you need, when you need it.  Thank you for choosing [facility] at [facility]!!

## 2019-06-13 ENCOUNTER — Other Ambulatory Visit: Payer: Self-pay | Admitting: Family Medicine

## 2019-06-14 ENCOUNTER — Other Ambulatory Visit: Payer: Self-pay

## 2019-06-22 ENCOUNTER — Telehealth: Payer: Self-pay | Admitting: *Deleted

## 2019-06-22 NOTE — Telephone Encounter (Signed)
Received call from pt stating over the last 3 months she has been experiencing a sharp stabbing pain intravaginally.  Pt denies any dryness and states she uses coconut oil frequently to prevent dryness. Pt also denies any discharge or recent trauma. Pt states she had a total hysterectomy in 2001 and no longer sees a gynecologist.  RN will review with MD to determine best course of action.

## 2019-07-01 ENCOUNTER — Other Ambulatory Visit: Payer: Self-pay | Admitting: Family Medicine

## 2019-08-06 ENCOUNTER — Emergency Department (HOSPITAL_COMMUNITY)
Admission: EM | Admit: 2019-08-06 | Discharge: 2019-08-07 | Disposition: A | Discharge status: Home / Self Care | Payer: 59 | Attending: Emergency Medicine | Admitting: Emergency Medicine

## 2019-08-06 ENCOUNTER — Emergency Department (HOSPITAL_COMMUNITY): Payer: 59

## 2019-08-06 ENCOUNTER — Other Ambulatory Visit: Payer: Self-pay

## 2019-08-06 DIAGNOSIS — F17210 Nicotine dependence, cigarettes, uncomplicated: Secondary | ICD-10-CM | POA: Insufficient documentation

## 2019-08-06 DIAGNOSIS — E119 Type 2 diabetes mellitus without complications: Secondary | ICD-10-CM | POA: Diagnosis not present

## 2019-08-06 DIAGNOSIS — Z79899 Other long term (current) drug therapy: Secondary | ICD-10-CM | POA: Diagnosis not present

## 2019-08-06 DIAGNOSIS — M79604 Pain in right leg: Secondary | ICD-10-CM | POA: Diagnosis not present

## 2019-08-06 DIAGNOSIS — R2243 Localized swelling, mass and lump, lower limb, bilateral: Secondary | ICD-10-CM | POA: Insufficient documentation

## 2019-08-06 DIAGNOSIS — M79605 Pain in left leg: Secondary | ICD-10-CM | POA: Insufficient documentation

## 2019-08-06 DIAGNOSIS — I10 Essential (primary) hypertension: Secondary | ICD-10-CM | POA: Diagnosis not present

## 2019-08-06 DIAGNOSIS — M7989 Other specified soft tissue disorders: Secondary | ICD-10-CM

## 2019-08-06 LAB — CBC
HCT: 37.5 % (ref 36.0–46.0)
Hemoglobin: 12.4 g/dL (ref 12.0–15.0)
MCH: 30.3 pg (ref 26.0–34.0)
MCHC: 33.1 g/dL (ref 30.0–36.0)
MCV: 91.7 fL (ref 80.0–100.0)
Platelets: 242 10*3/uL (ref 150–400)
RBC: 4.09 MIL/uL (ref 3.87–5.11)
RDW: 13.2 % (ref 11.5–15.5)
WBC: 8.5 10*3/uL (ref 4.0–10.5)
nRBC: 0 % (ref 0.0–0.2)

## 2019-08-06 LAB — COMPREHENSIVE METABOLIC PANEL
ALT: 13 U/L (ref 0–44)
AST: 24 U/L (ref 15–41)
Albumin: 3.7 g/dL (ref 3.5–5.0)
Alkaline Phosphatase: 74 U/L (ref 38–126)
Anion gap: 14 (ref 5–15)
BUN: 19 mg/dL (ref 6–20)
CO2: 25 mmol/L (ref 22–32)
Calcium: 9 mg/dL (ref 8.9–10.3)
Chloride: 101 mmol/L (ref 98–111)
Creatinine, Ser: 1 mg/dL (ref 0.44–1.00)
GFR calc Af Amer: >60 mL/min (ref >60)
GFR calc non Af Amer: >60 mL/min (ref >60)
Glucose, Bld: 129 mg/dL — ABNORMAL HIGH (ref 70–99)
Potassium: 2.7 mmol/L — CL (ref 3.5–5.1)
Sodium: 140 mmol/L (ref 135–145)
Total Bilirubin: 0.3 mg/dL (ref 0.3–1.2)
Total Protein: 6.5 g/dL (ref 6.5–8.1)

## 2019-08-06 LAB — URINALYSIS, ROUTINE W REFLEX MICROSCOPIC
Bilirubin Urine: NEGATIVE
Glucose, UA: NEGATIVE mg/dL
Ketones, ur: NEGATIVE mg/dL
Leukocytes,Ua: NEGATIVE
Nitrite: NEGATIVE
Protein, ur: NEGATIVE mg/dL
Specific Gravity, Urine: 1.014 (ref 1.005–1.030)
pH: 6 (ref 5.0–8.0)

## 2019-08-06 LAB — PROTIME-INR
INR: 0.9 (ref 0.8–1.2)
Prothrombin Time: 12.2 seconds (ref 11.4–15.2)

## 2019-08-06 MED ORDER — ENOXAPARIN SODIUM 120 MG/0.8ML ~~LOC~~ SOLN
113.0000 mg | Freq: Once | SUBCUTANEOUS | Status: AC
  Administered 2019-08-07: 115 mg via SUBCUTANEOUS
  Filled 2019-08-06: qty 0.75

## 2019-08-06 MED ORDER — SODIUM CHLORIDE 0.9% FLUSH: 3.0000 mL | Freq: Once | INTRAVENOUS | Status: DC

## 2019-08-06 MED ORDER — HYDROMORPHONE HCL 1 MG/ML IJ SOLN
1.0000 mg | Freq: Once | INTRAMUSCULAR | Status: AC
  Administered 2019-08-06: 1 mg via INTRAMUSCULAR
  Filled 2019-08-06: qty 1

## 2019-08-06 MED ORDER — POTASSIUM CHLORIDE CRYS ER 20 MEQ PO TBCR
60.0000 meq | EXTENDED_RELEASE_TABLET | Freq: Once | ORAL | Status: AC
  Administered 2019-08-06: 60 meq via ORAL
  Filled 2019-08-06: qty 3

## 2019-08-06 NOTE — Discharge Instructions (Signed)
Please report to be emergency department at 11 AM tomorrow for your ultrasound to rule out a deep vein thrombosis.  Please return to emergency department if you develop any new or worsening symptoms.  Please follow-up with the orthopedic doctor for further evaluation and treatment of your loosening hardware in both knees.

## 2019-08-06 NOTE — ED Triage Notes (Signed)
The pt has had  Pain and swelling in both her legs especially in her lt calf and knee.  No temp  The pain is increasing   She thinks its a drug reaction  On a cancer drus for breast cancer

## 2019-08-06 NOTE — ED Provider Notes (Signed)
[facility] EMERGENCY DEPARTMENT Provider Note   CSN: [id number] Arrival date & time: 08/06/19  2142     History   Chief Complaint Chief Complaint  Patient presents with  . Leg Pain    HPI [name] is a 58 y.o. female with history of breast cancer on tamoxifen, diabetes, hypertension who presents with a 3-day history of bilateral leg swelling and pain, worse on the left.  Patient reports pain mostly in her calves.  She also reports having bilateral knee replacements revised multiple times, however her pain feels different than previous knee pain.  She denies any fever, chest pain, shortness of breath, abdominal pain, nausea, vomiting.  Patient has been on tamoxifen almost a year.  She has been off chemo since 2017.  She has been taking her home pain medication, methadone and oxycodone 10 mg without relief.  She goes to pain clinic for chronic back pain.  She denies any saddle anesthesia, bowel or bladder incontinence, numbness or tingling in her legs.     HPI  Past Medical History:  Diagnosis Date  . Breast cancer ([place]) 2017   right breast  . Breast cancer of lower-inner quadrant of right female breast ([place]) 08/08/2015  . Colon polyps   . DDD (degenerative disc disease), cervical   . Degenerative joint disease   . Diabetes mellitus without complication ([place])   . Diverticulosis   . Gallstones   . GERD (gastroesophageal reflux disease)   . Headache(784.0)    migraine like per patient  . Hypertension   . Low back pain    dr phillips, pain management  . Osteoarthritis   . Personal history of chemotherapy 2017  . Personal history of radiation therapy 2017    Patient Active Problem List   Diagnosis Date Noted  . DDD (degenerative disc disease), lumbar 07/16/2018    Class: Chronic  . Other spondylosis with radiculopathy, lumbar region 07/16/2018    Class: Chronic  . S/P lumbar fusion 07/13/2018  . Tenosynovitis, de Quervain 08/10/2017  . Multiple  lipomas 07/15/2017  . Insomnia 07/16/2016  . Left groin pain 11/30/2015  . Severe obesity (BMI >= 40) ([place]) 10/26/2015  . Malignant neoplasm of lower-inner quadrant of right breast of female, estrogen receptor positive ([place]) 08/08/2015  . S/P revision of total knee 07/24/2015  . Acute postoperative pain 07/11/2015  . Loose total knee arthroplasty ([place]) 07/02/2015  . Type 2 diabetes mellitus without complications ([place]) [id number]  . Adhesive capsulitis of left shoulder 10/11/2014  . Paraspinous mass, left 03/31/2013  . Other symptoms and signs involving the nervous system 03/31/2013  . Internal hemorrhoid 02/17/2013  . Abdominal pain, other specified site 02/17/2013  . Neoplasm of soft tissue, left shoulder and left upper arm 08/03/2012  . Lightheadedness 01/29/2012  . Cholelithiasis with cholecystitis 10/08/2011  . Hypokalemia 09/17/2011  . Gastro-esophageal reflux disease without esophagitis 08/08/2010  . Gout 12/18/2008  . Essential (primary) hypertension 03/15/2007  . OA (osteoarthritis) of knee 03/15/2007  . HEADACHE 03/15/2007    Past Surgical History:  Procedure Laterality Date  . CHOLECYSTECTOMY  10/28/2011   Procedure: LAPAROSCOPIC CHOLECYSTECTOMY WITH INTRAOPERATIVE CHOLANGIOGRAM;  Surgeon: Earnstine Regal, MD;  Location: [facility];  Service: General;  Laterality: N/A;  . COLONOSCOPY WITH ESOPHAGOGASTRODUODENOSCOPY (EGD)  02/03/2013   with Propofol  . EXAM UNDER ANESTHESIA WITH MANIPULATION OF KNEE Right 05/30/2003  . EXAM UNDER ANESTHESIA WITH MANIPULATION OF KNEE Left 12/27/2002  . GANGLION CYST EXCISION Right    right  wrist  . KNEE ARTHROSCOPY Bilateral 01/02/2000  . KNEE ARTHROSCOPY Right   . LAPAROSCOPIC VAGINAL HYSTERECTOMY WITH SALPINGO OOPHORECTOMY Bilateral 07/13/2006  . LIPOMA EXCISION Right 02/21/2008   hip  . MASS EXCISION  08/24/2012   Procedure: EXCISION MASS;  Surgeon: Earnstine Regal, MD;  Location: [facility];  Service: General;  Laterality:  Left;  excisie soft tissue masses left shoulder(back) & left upper arm  . MASS EXCISION Left 01/24/2014   Procedure: EXCISION SOFT TISSUE MASSES LOWER LEFT BACK;  Surgeon: Earnstine Regal, MD;  Location: [facility];  Service: General;  Laterality: Left;  Marland Kitchen MASS EXCISION Right 04/30/2016   Procedure: EXCISION OF SKIN RIGHT CHEST WALL;  Surgeon: Autumn Messing III, MD;  Location: [facility];  Service: General;  Laterality: Right;  EXCISION OF SKIN RIGHT CHEST WALL  . MASTECTOMY Right 2017  . MASTECTOMY W/ SENTINEL NODE BIOPSY Right 10/01/2015   Procedure: RIGHT MASTECTOMY WITH SENTINEL LYMPH NODE BIOPSY;  Surgeon: Autumn Messing III, MD;  Location: [facility];  Service: General;  Laterality: Right;  . PORT-A-CATH REMOVAL Left 04/30/2016   Procedure: REMOVAL PORT-A-CATH;  Surgeon: Autumn Messing III, MD;  Location: [facility];  Service: General;  Laterality: Left;  REMOVAL PORT-A-CATH  . PORTACATH PLACEMENT Left 11/01/2015   Procedure: INSERTION PORT-A-CATH;  Surgeon: Autumn Messing III, MD;  Location: [facility];  Service: General;  Laterality: Left;  . REPLACEMENT UNICONDYLAR JOINT KNEE Left 04/20/2001  . REVISION TOTAL KNEE ARTHROPLASTY Right 06/18/2004  . REVISION TOTAL KNEE ARTHROPLASTY Left 11/15/2002  . REVISION TOTAL KNEE ARTHROPLASTY Left 10/12/2001  . REVISION TOTAL KNEE ARTHROPLASTY Right 07/09/2015  . TONSILLECTOMY    . TOTAL KNEE ARTHROPLASTY Right 04/25/2003  . TOTAL KNEE ARTHROPLASTY Left 06/25/2009     OB History   No obstetric history on file.      Home Medications    Prior to Admission medications   Medication Sig Start Date End Date Taking? Authorizing Provider  amitriptyline (ELAVIL) 100 MG tablet Take 100 mg by mouth at bedtime. 05/24/19  Yes [provider]  amLODipine (NORVASC) 10 MG tablet Take 10 mg by mouth daily. 07/17/19  Yes [provider]  KLOR-CON M20 20 MEQ tablet TAKE 1 TABLET BY MOUTH EVERY DAY Patient taking  differently: Take 20 mEq by mouth daily.  04/20/19  Yes Laurey Morale, MD  lisinopril (ZESTRIL) 10 MG tablet Take 1 tablet (10 mg total) by mouth daily. 01/25/19  Yes Laurey Morale, MD  metFORMIN (GLUCOPHAGE) 500 MG tablet TAKE 1 TABLET BY MOUTH TWICE A DAY WITH A MEAL Patient taking differently: Take 500 mg by mouth daily with breakfast.  07/01/19  Yes Laurey Morale, MD  methadone (DOLOPHINE) 10 MG tablet Take 10 mg by mouth every 8 (eight) hours.  05/24/19  Yes [provider]  metoprolol tartrate (LOPRESSOR) 50 MG tablet Take 1 tablet (50 mg total) by mouth 2 (two) times daily. 01/25/19  Yes Laurey Morale, MD  omeprazole (PRILOSEC) 40 MG capsule TAKE 1 CAPSULE BY MOUTH EVERY DAY Patient taking differently: Take 40 mg by mouth daily.  06/13/19  Yes Laurey Morale, MD  Oxycodone HCl 10 MG TABS Take 10 mg by mouth every 6 (six) hours as needed (for pain).  07/22/19  Yes [provider]  tamoxifen (NOLVADEX) 20 MG tablet Take 20 mg by mouth daily. 01/16/19  Yes [provider]  zolpidem (AMBIEN) 10 MG tablet TAKE  1 TABLET BY MOUTH AT BEDTIME AS NEEDED FOR INSOMNIA Patient taking differently: Take 10 mg by mouth at bedtime as needed for sleep.  05/23/19  Yes Laurey Morale, MD  [facility] ULTRA TEST test strip TEST ONCE DAILY 06/26/18   Laurey Morale, MD  [facility] DELICA LANCETS FINE MISC USE TO TEST ONCE DAILY DX CODE E11.9 12/22/17   Laurey Morale, MD    Family History Family History  Problem Relation Age of Onset  . Stomach cancer Mother   . Heart disease Father   . Prostate cancer Father   . Kidney cancer Sister        one out of the four  . Hypertension Sister   . Hypertension Brother   . Crohn's disease Other        nephew  . Colon cancer Maternal Grandmother   . Esophageal cancer Neg Hx     Social History Social History   Tobacco Use  . Smoking status: Current Every Day Smoker    Packs/day: 0.25    Years: 20.00    Pack years: 5.00    Types: Cigarettes   . Smokeless tobacco: Former Systems developer    Quit date: 09/30/2015  . Tobacco comment: smokes about 4 cigs/day; working on quitting - 08/22/16 gwd  Substance Use Topics  . Alcohol use: No    Alcohol/week: 0.0 standard drinks  . Drug use: No     Allergies   Codeine, Latex, Sulfonamide derivatives, Gabapentin, and Lyrica [pregabalin]   Review of Systems Review of Systems  Constitutional: Negative for chills and fever.  HENT: Negative for facial swelling and sore throat.   Respiratory: Negative for shortness of breath.   Cardiovascular: Positive for leg swelling. Negative for chest pain.  Gastrointestinal: Negative for abdominal pain, nausea and vomiting.  Genitourinary: Negative for dysuria.  Musculoskeletal: Positive for arthralgias. Negative for back pain.  Skin: Negative for rash and wound.  Neurological: Negative for headaches.  Psychiatric/Behavioral: The patient is not nervous/anxious.      Physical Exam Updated Vital Signs BP (!) 145/88   Pulse 76   Temp 98.1 F (36.7 C) (Oral)   Resp 16   Ht 5\' 7"  (1.702 m)   Wt 114.3 kg   SpO2 100%   BMI 39.47 kg/m   Physical Exam Vitals signs and nursing note reviewed.  Constitutional:      General: She is not in acute distress.    Appearance: She is well-developed. She is not diaphoretic.  HENT:     Head: Normocephalic and atraumatic.     Mouth/Throat:     Pharynx: No oropharyngeal exudate.  Eyes:     General: No scleral icterus.       Right eye: No discharge.        Left eye: No discharge.     Conjunctiva/sclera: Conjunctivae normal.     Pupils: Pupils are equal, round, and reactive to light.  Neck:     Musculoskeletal: Normal range of motion and neck supple.     Thyroid: No thyromegaly.  Cardiovascular:     Rate and Rhythm: Normal rate and regular rhythm.     Heart sounds: Normal heart sounds. No murmur. No friction rub. No gallop.   Pulmonary:     Effort: Pulmonary effort is normal. No respiratory distress.     Breath  sounds: Normal breath sounds. No stridor. No wheezing or rales.  Abdominal:     General: Bowel sounds are normal. There is no distension.  Palpations: Abdomen is soft.     Tenderness: There is no abdominal tenderness. There is no guarding or rebound.  Musculoskeletal:     Right knee: Tenderness (posterior) found.     Left knee: Tenderness (anterior) found.     Right lower leg: She exhibits tenderness. Edema present.     Left lower leg: She exhibits tenderness. Edema present.  Lymphadenopathy:     Cervical: No cervical adenopathy.  Skin:    General: Skin is warm and dry.     Coloration: Skin is not pale.     Findings: No rash.  Neurological:     Mental Status: She is alert.     Coordination: Coordination normal.      ED Treatments / Results  Labs (all labs ordered are listed, but only abnormal results are displayed) Labs Reviewed  COMPREHENSIVE METABOLIC PANEL - Abnormal; Notable for the following components:      Result Value   Potassium 2.7 (*)    Glucose, Bld 129 (*)    All other components within normal limits  URINALYSIS, ROUTINE W REFLEX MICROSCOPIC - Abnormal; Notable for the following components:   Hgb urine dipstick MODERATE (*)    Bacteria, UA RARE (*)    All other components within normal limits  CBC  PROTIME-INR    EKG EKG Interpretation  Date/Time:  Saturday August 06 2019 23:27:21 EST Ventricular Rate:  68 PR Interval:    QRS Duration: 103 QT Interval:  460 QTC Calculation: 490 R Axis:   48 Text Interpretation: Sinus rhythm Probable left atrial enlargement Minimal ST depression, diffuse leads Borderline prolonged QT interval When compared with ECG of 04/19/2019, No significant change was found Confirmed by Delora Fuel [id number]) on 08/06/2019 11:32:38 PM   Radiology Dg Knee Complete 4 Views Left  Result Date: 08/06/2019 CLINICAL DATA:  Knee pain, no known injury, initial encounter EXAM: LEFT KNEE - COMPLETE 4+ VIEW COMPARISON:  05/07/2018  FINDINGS: Left knee prosthesis is noted. The overall appearance is similar to that seen on the prior exam. There is lucency again along the tibial component suggestive of loosening. This is stable from the prior exam. No joint effusion is seen. No other focal abnormality is noted. IMPRESSION: Persistent changes suggestive of loosening along the tibial component. Electronically Signed   By: Inez Catalina M.D.   On: 08/06/2019 23:20   Dg Knee Complete 4 Views Right  Result Date: 08/06/2019 CLINICAL DATA:  Knee pain EXAM: RIGHT KNEE - COMPLETE 4+ VIEW COMPARISON:  05/07/2018 FINDINGS: Prosthesis is again identified. Lucency is noted along the tibial component similar to that seen on the prior exam. No acute fracture or dislocation is noted. No soft tissue abnormality is seen. IMPRESSION: Stable changes suspicious for loosening of the tibial component. No other focal abnormality is noted. Electronically Signed   By: Inez Catalina M.D.   On: 08/06/2019 23:22    Procedures Procedures (including critical care time)  Medications Ordered in ED Medications  sodium chloride flush (NS) 0.9 % injection 3 mL (3 mLs Intravenous Not Given 08/06/19 2251)  HYDROmorphone (DILAUDID) injection 1 mg (1 mg Intramuscular Given 08/06/19 2249)  potassium chloride SA (KLOR-CON) CR tablet 60 mEq (60 mEq Oral Given 08/06/19 2322)  enoxaparin (LOVENOX) injection 115 mg (115 mg Subcutaneous Given 08/07/19 0053)     Initial Impression / Assessment and Plan / ED Course  I have reviewed the triage vital signs and the nursing notes.  Pertinent labs & imaging results that were available during my  care of the patient were reviewed by me and considered in my medical decision making (see chart for details).        Patient presenting with bilateral leg pain and swelling, worse on the left.  She takes tamoxifen and has for the past 9 months.  DVT ultrasound is unavailable at this time of day.  Will have patient return tomorrow  for this.  Labs are unremarkable except for hypokalemia at 2.7 with stable EKG.  Patient has history of this and takes 20 mEq of potassium daily.  60 mEq given in the ED and patient advised to double her dose for the next 5 days.  Advised recheck with PCP.  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  This is not new and it was seen on last x-ray.  Will have patient follow-up with orthopedics for this.  Patient's pain could be related to this of DVT ultrasound is negative.  Continue home pain medication as prescribed.  Patient given dose of Lovenox prior to discharge.  Patient understands and agrees with plan.  Patient vitals stable throughout ED course and discharged in satisfactory condition. I discussed patient case with Dr. Tyrone Nine who guided the patient's management and agrees with plan.   Final Clinical Impressions(s) / ED Diagnoses   Final diagnoses:  Bilateral leg pain  Leg swelling    ED Discharge Orders         Ordered    LE VENOUS     08/06/19 2335           Frederica Kuster, PA-C 08/07/19 0129    Little, Wenda Overland, MD 08/07/19 1332

## 2019-08-07 ENCOUNTER — Ambulatory Visit (HOSPITAL_BASED_OUTPATIENT_CLINIC_OR_DEPARTMENT_OTHER)
Admission: RE | Admit: 2019-08-07 | Discharge: 2019-08-07 | Disposition: A | Discharge status: Home / Self Care | Payer: 59 | Source: Ambulatory Visit | Attending: Emergency Medicine | Admitting: Emergency Medicine

## 2019-08-07 DIAGNOSIS — R52 Pain, unspecified: Secondary | ICD-10-CM

## 2019-08-07 NOTE — ED Notes (Signed)
Patient verbalizes understanding of discharge instructions. Opportunity for questioning and answers were provided. Armband removed by staff, pt discharged from ED.  

## 2019-08-07 NOTE — Progress Notes (Signed)
VASCULAR LAB PRELIMINARY  PRELIMINARY  PRELIMINARY  PRELIMINARY  Bilateral lower extremity venous duplex completed.    Preliminary report:  See CV proc for preliminary results.  Arel Tippen, RVT 08/07/2019, 11:31 AM

## 2019-08-09 ENCOUNTER — Other Ambulatory Visit: Payer: Self-pay | Admitting: Family Medicine

## 2019-08-10 ENCOUNTER — Ambulatory Visit: Payer: 59 | Admitting: Specialist

## 2019-08-22 ENCOUNTER — Telehealth: Payer: Self-pay

## 2019-08-22 ENCOUNTER — Other Ambulatory Visit: Payer: Self-pay | Admitting: Family Medicine

## 2019-08-22 DIAGNOSIS — E119 Type 2 diabetes mellitus without complications: Secondary | ICD-10-CM

## 2019-08-22 NOTE — Telephone Encounter (Signed)
Copied from [place] [id number]. Topic: General - Inquiry >> Aug 22, 2019 10:08 AM Richardo Priest, NT wrote: Reason for CRM: Patient called in stating she would like to have some routine labs ordered. Please advise.

## 2019-08-23 NOTE — Telephone Encounter (Signed)
Set up an in person complete physical and have her come in fasting

## 2019-08-23 NOTE — Telephone Encounter (Signed)
Called pt to schedule appt. No answer or vm.

## 2019-08-26 NOTE — Telephone Encounter (Signed)
Pt has been scheduled.  °

## 2019-09-08 ENCOUNTER — Other Ambulatory Visit: Payer: Self-pay

## 2019-09-09 ENCOUNTER — Ambulatory Visit (INDEPENDENT_AMBULATORY_CARE_PROVIDER_SITE_OTHER): Payer: 59 | Admitting: Family Medicine

## 2019-09-09 ENCOUNTER — Encounter: Payer: Self-pay | Admitting: Family Medicine

## 2019-09-09 VITALS — BP 140/80 | HR 72 | Temp 97.4°F | Wt 260.2 lb

## 2019-09-09 DIAGNOSIS — Z0000 Encounter for general adult medical examination without abnormal findings: Secondary | ICD-10-CM | POA: Diagnosis not present

## 2019-09-09 DIAGNOSIS — E119 Type 2 diabetes mellitus without complications: Secondary | ICD-10-CM | POA: Diagnosis not present

## 2019-09-09 LAB — CBC WITH DIFFERENTIAL/PLATELET
Basophils Absolute: 0 10*3/uL (ref 0.0–0.1)
Basophils Relative: 0.7 % (ref 0.0–3.0)
Eosinophils Absolute: 0.1 10*3/uL (ref 0.0–0.7)
Eosinophils Relative: 1.2 % (ref 0.0–5.0)
HCT: 38.8 % (ref 36.0–46.0)
Hemoglobin: 12.9 g/dL (ref 12.0–15.0)
Lymphocytes Relative: 33.2 % (ref 12.0–46.0)
Lymphs Abs: 2.1 10*3/uL (ref 0.7–4.0)
MCHC: 33.2 g/dL (ref 30.0–36.0)
MCV: 89.8 fl (ref 78.0–100.0)
Monocytes Absolute: 0.3 10*3/uL (ref 0.1–1.0)
Monocytes Relative: 4.8 % (ref 3.0–12.0)
Neutro Abs: 3.8 10*3/uL (ref 1.4–7.7)
Neutrophils Relative %: 60.1 % (ref 43.0–77.0)
Platelets: 238 10*3/uL (ref 150.0–400.0)
RBC: 4.32 Mil/uL (ref 3.87–5.11)
RDW: 13.6 % (ref 11.5–15.5)
WBC: 6.4 10*3/uL (ref 4.0–10.5)

## 2019-09-09 LAB — TSH: TSH: 3.34 u[IU]/mL (ref 0.35–4.50)

## 2019-09-09 LAB — LIPID PANEL
Cholesterol: 188 mg/dL (ref 0–200)
HDL: 38.8 mg/dL — ABNORMAL LOW (ref >39.00)
NonHDL: 149.4
Total CHOL/HDL Ratio: 5
Triglycerides: 383 mg/dL — ABNORMAL HIGH (ref 0.0–149.0)
VLDL: 76.6 mg/dL — ABNORMAL HIGH (ref 0.0–40.0)

## 2019-09-09 LAB — BASIC METABOLIC PANEL
BUN: 13 mg/dL (ref 6–23)
CO2: 31 mEq/L (ref 19–32)
Calcium: 9.1 mg/dL (ref 8.4–10.5)
Chloride: 101 mEq/L (ref 96–112)
Creatinine, Ser: 1.03 mg/dL (ref 0.40–1.20)
GFR: 66.18 mL/min (ref >60.00)
Glucose, Bld: 99 mg/dL (ref 70–99)
Potassium: 3.7 mEq/L (ref 3.5–5.1)
Sodium: 140 mEq/L (ref 135–145)

## 2019-09-09 LAB — HEMOGLOBIN A1C: Hgb A1c MFr Bld: 6.6 % — ABNORMAL HIGH (ref 4.6–6.5)

## 2019-09-09 LAB — HEPATIC FUNCTION PANEL
ALT: 11 U/L (ref 0–35)
AST: 18 U/L (ref 0–37)
Albumin: 4.2 g/dL (ref 3.5–5.2)
Alkaline Phosphatase: 73 U/L (ref 39–117)
Bilirubin, Direct: 0.1 mg/dL (ref 0.0–0.3)
Total Bilirubin: 0.4 mg/dL (ref 0.2–1.2)
Total Protein: 6.7 g/dL (ref 6.0–8.3)

## 2019-09-09 LAB — LDL CHOLESTEROL, DIRECT: Direct LDL: 81 mg/dL

## 2019-09-09 MED ORDER — ZOLPIDEM TARTRATE 10 MG PO TABS: 20.0000 mg | ORAL_TABLET | Freq: Every day | ORAL | 5 refills | Status: DC

## 2019-09-09 MED ORDER — AMLODIPINE BESYLATE 10 MG PO TABS: 10.0000 mg | ORAL_TABLET | Freq: Every day | ORAL | 3 refills | Status: DC

## 2019-09-09 MED ORDER — POTASSIUM CHLORIDE CRYS ER 20 MEQ PO TBCR: 20.0000 meq | EXTENDED_RELEASE_TABLET | Freq: Every day | ORAL | 3 refills | Status: DC

## 2019-09-09 NOTE — Progress Notes (Signed)
   Subjective:    Patient ID: [name], female    DOB: 06-23-1961, 58 y.o.   MRN: [id number]  HPI Here for a well exam. Other than her orthopedic issues she is doing well. Her BP and glucoses have been well managed. She does have trouble with sleeping. She has been on Ambien for about 2 years. It worked well at first but not for several months. She often gets only 3 hours of sleep a night.    Review of Systems  Constitutional: Negative.   HENT: Negative.   Eyes: Negative.   Respiratory: Negative.   Cardiovascular: Negative.   Gastrointestinal: Negative.   Genitourinary: Negative for decreased urine volume, difficulty urinating, dyspareunia, dysuria, enuresis, flank pain, frequency, hematuria, pelvic pain and urgency.  Musculoskeletal: Positive for arthralgias and back pain.  Skin: Negative.   Neurological: Negative.   Psychiatric/Behavioral: Positive for sleep disturbance.       Objective:   Physical Exam Constitutional:      General: She is not in acute distress.    Appearance: She is well-developed. She is obese.  HENT:     Head: Normocephalic and atraumatic.     Right Ear: External ear normal.     Left Ear: External ear normal.     Nose: Nose normal.     Mouth/Throat:     Pharynx: No oropharyngeal exudate.  Eyes:     General: No scleral icterus.    Conjunctiva/sclera: Conjunctivae normal.     Pupils: Pupils are equal, round, and reactive to light.  Neck:     Thyroid: No thyromegaly.     Vascular: No JVD.  Cardiovascular:     Rate and Rhythm: Normal rate and regular rhythm.     Heart sounds: Normal heart sounds. No murmur. No friction rub. No gallop.   Pulmonary:     Effort: Pulmonary effort is normal. No respiratory distress.     Breath sounds: Normal breath sounds. No wheezing or rales.  Chest:     Chest wall: No tenderness.  Abdominal:     General: Bowel sounds are normal. There is no distension.     Palpations: Abdomen is soft. There is no mass.   Tenderness: There is no abdominal tenderness. There is no guarding or rebound.  Musculoskeletal:        General: No tenderness. Normal range of motion.     Cervical back: Normal range of motion and neck supple.  Lymphadenopathy:     Cervical: No cervical adenopathy.  Skin:    General: Skin is warm and dry.     Findings: No erythema or rash.  Neurological:     Mental Status: She is alert and oriented to person, place, and time.     Cranial Nerves: No cranial nerve deficit.     Motor: No abnormal muscle tone.     Coordination: Coordination normal.     Deep Tendon Reflexes: Reflexes are normal and symmetric. Reflexes normal.  Psychiatric:        Behavior: Behavior normal.        Thought Content: Thought content normal.        Judgment: Judgment normal.           Assessment & Plan:  Well exam. We discussed diet and exercise. Get fasting labs including an A1c. For sleep she will try taking 2 Ambien pills (20 mg) at bedtime.  Alysia Penna, MD

## 2019-09-14 ENCOUNTER — Ambulatory Visit: Payer: 59 | Admitting: Specialist

## 2019-09-19 ENCOUNTER — Telehealth: Payer: Self-pay

## 2019-09-19 NOTE — Telephone Encounter (Signed)
Pt notified of lab results and verbalized understanding.

## 2019-09-19 NOTE — Telephone Encounter (Signed)
Copied from [place] [id number]. Topic: General - Other >> Sep 19, 2019  2:45 PM Leward Quan A wrote: Reason for CRM: Patient called to request her lab results from last labs completed. Waiting for a call at Ph# [phone]

## 2019-09-20 ENCOUNTER — Encounter: Payer: Self-pay | Admitting: Family Medicine

## 2019-09-20 ENCOUNTER — Other Ambulatory Visit: Payer: Self-pay

## 2019-09-20 ENCOUNTER — Telehealth: Payer: Self-pay

## 2019-09-20 ENCOUNTER — Ambulatory Visit (INDEPENDENT_AMBULATORY_CARE_PROVIDER_SITE_OTHER): Payer: 59 | Admitting: Family Medicine

## 2019-09-20 VITALS — BP 156/90 | Temp 97.7°F | Ht 67.0 in | Wt 257.6 lb

## 2019-09-20 DIAGNOSIS — R300 Dysuria: Secondary | ICD-10-CM | POA: Diagnosis not present

## 2019-09-20 LAB — POCT URINALYSIS DIPSTICK
Bilirubin, UA: NEGATIVE
Blood, UA: POSITIVE
Glucose, UA: NEGATIVE
Ketones, UA: NEGATIVE
Nitrite, UA: NEGATIVE
Protein, UA: POSITIVE — AB
Spec Grav, UA: 1.015 (ref 1.010–1.025)
Urobilinogen, UA: 0.2 E.U./dL
pH, UA: 6 (ref 5.0–8.0)

## 2019-09-20 MED ORDER — CEPHALEXIN 500 MG PO CAPS: 500.0000 mg | ORAL_CAPSULE | Freq: Four times a day (QID) | ORAL | 0 refills | Status: DC

## 2019-09-20 MED ORDER — ONDANSETRON 4 MG PO TBDP: 4.0000 mg | ORAL_TABLET | Freq: Three times a day (TID) | ORAL | 0 refills | Status: DC | PRN

## 2019-09-20 NOTE — Patient Instructions (Signed)

## 2019-09-20 NOTE — Telephone Encounter (Signed)
Copied from [place] [id number]. Topic: Appointment Scheduling - Scheduling Inquiry for Clinic >> Sep 20, 2019 10:02 AM Burchel, Abbi R wrote: Reason for CRM: Pt would like to sched same day appt for UTI sx, please call pt: [phone]

## 2019-09-20 NOTE — Progress Notes (Signed)
Subjective:     Patient ID: [name], female   DOB: 1961/08/15, 58 y.o.   MRN: [id number]  HPI   [name] is seen with onset yesterday morning of severe burning with urination and some decreased urinary volume.  She has had good volume today but has had persistent burning with urination.  No gross hematuria.  No fever.  No chills.  No flank pain.  She has had some nausea but no vomiting.  No recent UTIs.  No recent bladder instrumentation.  Allergic to sulfa  Past Medical History:  Diagnosis Date  . Breast cancer ([place]) 2017   right breast  . Breast cancer of lower-inner quadrant of right female breast ([place]) 08/08/2015  . Colon polyps   . DDD (degenerative disc disease), cervical   . Degenerative joint disease   . Diabetes mellitus without complication ([place])   . Diverticulosis   . Gallstones   . GERD (gastroesophageal reflux disease)   . Headache(784.0)    migraine like per patient  . Hypertension   . Low back pain    dr phillips, pain management  . Osteoarthritis   . Personal history of chemotherapy 2017  . Personal history of radiation therapy 2017   Past Surgical History:  Procedure Laterality Date  . CHOLECYSTECTOMY  10/28/2011   Procedure: LAPAROSCOPIC CHOLECYSTECTOMY WITH INTRAOPERATIVE CHOLANGIOGRAM;  Surgeon: Earnstine Regal, MD;  Location: [facility];  Service: General;  Laterality: N/A;  . COLONOSCOPY WITH ESOPHAGOGASTRODUODENOSCOPY (EGD)  04/13/2018   per Dr. Henrene Pastor, adenomatous polyp, repeat in 5 yrs   . EXAM UNDER ANESTHESIA WITH MANIPULATION OF KNEE Right 05/30/2003  . EXAM UNDER ANESTHESIA WITH MANIPULATION OF KNEE Left 12/27/2002  . GANGLION CYST EXCISION Right    right wrist  . KNEE ARTHROSCOPY Bilateral 01/02/2000  . KNEE ARTHROSCOPY Right   . LAPAROSCOPIC VAGINAL HYSTERECTOMY WITH SALPINGO OOPHORECTOMY Bilateral 07/13/2006  . LIPOMA EXCISION Right 02/21/2008   hip  . MASS EXCISION  08/24/2012   Procedure: EXCISION MASS;  Surgeon: Earnstine Regal, MD;   Location: [place];  Service: General;  Laterality: Left;  excisie soft tissue masses left shoulder(back) & left upper arm  . MASS EXCISION Left 01/24/2014   Procedure: EXCISION SOFT TISSUE MASSES LOWER LEFT BACK;  Surgeon: Earnstine Regal, MD;  Location: [place];  Service: General;  Laterality: Left;  Marland Kitchen MASS EXCISION Right 04/30/2016   Procedure: EXCISION OF SKIN RIGHT CHEST WALL;  Surgeon: Autumn Messing III, MD;  Location: [place];  Service: General;  Laterality: Right;  EXCISION OF SKIN RIGHT CHEST WALL  . MASTECTOMY Right 2017  . MASTECTOMY W/ SENTINEL NODE BIOPSY Right 10/01/2015   Procedure: RIGHT MASTECTOMY WITH SENTINEL LYMPH NODE BIOPSY;  Surgeon: Autumn Messing III, MD;  Location: [place];  Service: General;  Laterality: Right;  . PORT-A-CATH REMOVAL Left 04/30/2016   Procedure: REMOVAL PORT-A-CATH;  Surgeon: Autumn Messing III, MD;  Location: [facility];  Service: General;  Laterality: Left;  REMOVAL PORT-A-CATH  . PORTACATH PLACEMENT Left 11/01/2015   Procedure: INSERTION PORT-A-CATH;  Surgeon: Autumn Messing III, MD;  Location: [place];  Service: General;  Laterality: Left;  . REPLACEMENT UNICONDYLAR JOINT KNEE Left 04/20/2001  . REVISION TOTAL KNEE ARTHROPLASTY Right 06/18/2004  . REVISION TOTAL KNEE ARTHROPLASTY Left 11/15/2002  . REVISION TOTAL KNEE ARTHROPLASTY Left 10/12/2001  . REVISION TOTAL KNEE ARTHROPLASTY Right 07/09/2015  . TONSILLECTOMY    . TOTAL KNEE ARTHROPLASTY Right 04/25/2003  .  TOTAL KNEE ARTHROPLASTY Left 06/25/2009    reports that she has been smoking cigarettes. She has a 5.00 pack-year smoking history. She quit smokeless tobacco use about 3 years ago. She reports that she does not drink alcohol or use drugs. family history includes Colon cancer in her maternal grandmother; Crohn's disease in an other family member; Heart disease in her father; Hypertension in her brother and sister; Kidney cancer in her  sister; Prostate cancer in her father; Stomach cancer in her mother. Allergies  Allergen Reactions  . Codeine Itching, Nausea And Vomiting and Other (See Comments)    Itching all over the body  . Latex Hives  . Sulfonamide Derivatives Hives and Other (See Comments)    All over the body  . Gabapentin Other (See Comments)    headache  . Lyrica [Pregabalin] Other (See Comments)    headache  \   Review of Systems  Constitutional: Negative for appetite change, chills and fever.  Gastrointestinal: Positive for nausea. Negative for abdominal pain, constipation, diarrhea and vomiting.  Genitourinary: Positive for dysuria and frequency.  Musculoskeletal: Negative for back pain.  Neurological: Negative for dizziness.       Objective:   Physical Exam Vitals reviewed.  Constitutional:      Appearance: Normal appearance.  Cardiovascular:     Rate and Rhythm: Normal rate and regular rhythm.  Pulmonary:     Effort: Pulmonary effort is normal.     Breath sounds: Normal breath sounds.  Neurological:     Mental Status: She is alert.        Assessment:     1 day history of dysuria.  Suspect probable uncomplicated cystitis.  She has 3+ leukocytes and 2+ blood    Plan:     -Urine culture sent -Start Keflex 500 mg 4 times daily for 5 days -Plenty of fluids -Zofran 4 mg ODT every 8 hours as needed for nausea -Follow-up for any persistent or worsening symptoms  Eulas Post MD [facility]

## 2019-09-20 NOTE — Telephone Encounter (Signed)
Pt has been scheduled for today

## 2019-09-21 ENCOUNTER — Other Ambulatory Visit: Payer: Self-pay | Admitting: Oncology

## 2019-09-21 DIAGNOSIS — Z1231 Encounter for screening mammogram for malignant neoplasm of breast: Secondary | ICD-10-CM

## 2019-09-22 ENCOUNTER — Ambulatory Visit: Payer: Self-pay

## 2019-09-22 LAB — URINE CULTURE
MICRO NUMBER:: 1236867
Result:: NO GROWTH
SPECIMEN QUALITY:: ADEQUATE

## 2019-09-22 NOTE — Telephone Encounter (Signed)
Pt. Reports she is still having burning when she voids. States she has "been putting coconut oil down there because I get so dry." Instructed to hold of applying the coconut oil to vaginal area and see if that helps. Will call back if burning persists. Answer Assessment - Initial Assessment Questions 1. ANTIBIOTIC: "What antibiotic are you taking?" "How many times per day?"     Keflex 2. DURATION: "When was the antibiotic started?"     Yesterday 3. MAIN SYMPTOM: "What is the main symptom you are concerned about?"     Burning 4. FEVER: "Do you have a fever?" If so, ask: "What is it, how was it measured, and when did it start?"     No 5. OTHER SYMPTOMS: "Do you have any other symptoms?" (e.g., flank pain, vaginal discharge, blood in urine)     No  Protocols used: URINARY TRACT INFECTION ON ANTIBIOTIC FOLLOW-UP CALL - FEMALE-A-AH

## 2019-09-22 NOTE — Telephone Encounter (Signed)
Message Routed to PCP CMA 

## 2019-10-24 ENCOUNTER — Telehealth: Payer: Self-pay | Admitting: *Deleted

## 2019-10-24 ENCOUNTER — Other Ambulatory Visit: Payer: Self-pay

## 2019-10-24 ENCOUNTER — Telehealth: Payer: Self-pay

## 2019-10-24 ENCOUNTER — Other Ambulatory Visit: Payer: Self-pay | Admitting: *Deleted

## 2019-10-24 ENCOUNTER — Inpatient Hospital Stay: Payer: 59 | Attending: Oncology

## 2019-10-24 DIAGNOSIS — Z923 Personal history of irradiation: Secondary | ICD-10-CM | POA: Diagnosis not present

## 2019-10-24 DIAGNOSIS — Z90710 Acquired absence of both cervix and uterus: Secondary | ICD-10-CM | POA: Insufficient documentation

## 2019-10-24 DIAGNOSIS — Z9079 Acquired absence of other genital organ(s): Secondary | ICD-10-CM | POA: Insufficient documentation

## 2019-10-24 DIAGNOSIS — Z96653 Presence of artificial knee joint, bilateral: Secondary | ICD-10-CM | POA: Insufficient documentation

## 2019-10-24 DIAGNOSIS — Z8051 Family history of malignant neoplasm of kidney: Secondary | ICD-10-CM | POA: Insufficient documentation

## 2019-10-24 DIAGNOSIS — C50311 Malignant neoplasm of lower-inner quadrant of right female breast: Secondary | ICD-10-CM | POA: Insufficient documentation

## 2019-10-24 DIAGNOSIS — Z90722 Acquired absence of ovaries, bilateral: Secondary | ICD-10-CM | POA: Insufficient documentation

## 2019-10-24 DIAGNOSIS — Z9012 Acquired absence of left breast and nipple: Secondary | ICD-10-CM | POA: Insufficient documentation

## 2019-10-24 DIAGNOSIS — Z79810 Long term (current) use of selective estrogen receptor modulators (SERMs): Secondary | ICD-10-CM | POA: Insufficient documentation

## 2019-10-24 DIAGNOSIS — Z9221 Personal history of antineoplastic chemotherapy: Secondary | ICD-10-CM | POA: Insufficient documentation

## 2019-10-24 DIAGNOSIS — N951 Menopausal and female climacteric states: Secondary | ICD-10-CM | POA: Insufficient documentation

## 2019-10-24 DIAGNOSIS — Z170 Estrogen receptor positive status [ER+]: Secondary | ICD-10-CM | POA: Insufficient documentation

## 2019-10-24 DIAGNOSIS — Z800 Family history of malignant neoplasm of digestive organs: Secondary | ICD-10-CM | POA: Insufficient documentation

## 2019-10-24 LAB — CMP (CANCER CENTER ONLY)
ALT: 14 U/L (ref 0–44)
AST: 17 U/L (ref 15–41)
Albumin: 3.9 g/dL (ref 3.5–5.0)
Alkaline Phosphatase: 71 U/L (ref 38–126)
Anion gap: 10 (ref 5–15)
BUN: 13 mg/dL (ref 6–20)
CO2: 30 mmol/L (ref 22–32)
Calcium: 8.5 mg/dL — ABNORMAL LOW (ref 8.9–10.3)
Chloride: 103 mmol/L (ref 98–111)
Creatinine: 0.92 mg/dL (ref 0.44–1.00)
GFR, Est AFR Am: >60 mL/min (ref >60)
GFR, Estimated: >60 mL/min (ref >60)
Glucose, Bld: 118 mg/dL — ABNORMAL HIGH (ref 70–99)
Potassium: 3 mmol/L — CL (ref 3.5–5.1)
Sodium: 143 mmol/L (ref 135–145)
Total Bilirubin: 0.2 mg/dL — ABNORMAL LOW (ref 0.3–1.2)
Total Protein: 6.6 g/dL (ref 6.5–8.1)

## 2019-10-24 LAB — CBC WITH DIFFERENTIAL (CANCER CENTER ONLY)
Abs Immature Granulocytes: 0.02 10*3/uL (ref 0.00–0.07)
Basophils Absolute: 0 10*3/uL (ref 0.0–0.1)
Basophils Relative: 0 %
Eosinophils Absolute: 0.1 10*3/uL (ref 0.0–0.5)
Eosinophils Relative: 2 %
HCT: 36.6 % (ref 36.0–46.0)
Hemoglobin: 11.9 g/dL — ABNORMAL LOW (ref 12.0–15.0)
Immature Granulocytes: 0 %
Lymphocytes Relative: 35 %
Lymphs Abs: 2.1 10*3/uL (ref 0.7–4.0)
MCH: 29.4 pg (ref 26.0–34.0)
MCHC: 32.5 g/dL (ref 30.0–36.0)
MCV: 90.4 fL (ref 80.0–100.0)
Monocytes Absolute: 0.3 10*3/uL (ref 0.1–1.0)
Monocytes Relative: 5 %
Neutro Abs: 3.6 10*3/uL (ref 1.7–7.7)
Neutrophils Relative %: 58 %
Platelet Count: 248 10*3/uL (ref 150–400)
RBC: 4.05 MIL/uL (ref 3.87–5.11)
RDW: 14 % (ref 11.5–15.5)
WBC Count: 6.2 10*3/uL (ref 4.0–10.5)
nRBC: 0 % (ref 0.0–0.2)

## 2019-10-24 NOTE — Telephone Encounter (Signed)
Received a call from the lab reporting potassium level is 3.0. Billy Coast, RN for Dr. Jana Hakim made aware and to relay result to Dr. Jana Hakim.

## 2019-10-24 NOTE — Telephone Encounter (Signed)
Per lab draw today noted potassium of 3.0.  This RN called pt who states she does forget to take it and " I will take on right now"  This note will be forwarded to prescribing MD of lasix and potassium  for communication of abnormal lab.

## 2019-10-25 NOTE — Telephone Encounter (Signed)
Noted  

## 2019-10-26 NOTE — Progress Notes (Signed)
[place]  Telephone:([phone]) [phone] Fax:(336) [phone]   ID: [name] DOB: 06-Jun-1961  MR#: [id number]  TKZ#:[id number]  Patient Care Team: Laurey Morale, MD as PCP - General Jovita Kussmaul, MD as Consulting Physician (General Surgery) Sho Salguero, Virgie Dad, MD as Consulting Physician (Oncology) Tyler Pita, MD as Consulting Physician (Radiation Oncology) Nicholaus Bloom, MD (Anesthesiology) Delice Bison, Charlestine Massed, NP as Nurse Practitioner (Hematology and Oncology) [name], MD as Consulting Physician (Orthopedic Surgery) OTHER MD: Mardi Mainland MD, Nicholaus Bloom MD  CHIEF COMPLAINT: 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    She continues on tamoxifen with good tolerance.  Her only complaint is hot flashes.  She does not have vaginal wetness issues.  She did have a recent urinary tract infection which was treated with Keflex.  [name]'s last bone density screening on 10/02/2016, showed a T-score of -0.4, which is considered normal. She is overdue for her biennial bone density screening.   She is scheduled for left screening mammogram on 10/31/2019.   Since her last visit, she underwent abdomen/pelvis CT on 02/18/2019 for upper abdominal pain. This showed no acute intra-abdominal or pelvic finding; no evidence of metastatic disease; stable hepatic steatosis; stable left renal cysts.   She is followed by Dr. Louanne Skye since lumbar fusion surgery in 06/2018.  She presented to the ED on 04/19/2019 with chest pain. Chest x-ray performed at that time was negative for active cardiopulmonary disease.  She also presented to the ED on 08/06/2019 with bilateral knee pain. She is status post bilateral knee replacements. She underwent bilateral knee x-rays, which showed stable changes  suspicious for loosening of tibial component, as seen on prior exam.   REVIEW OF SYSTEMS: [name] tells me her husband is very careful even though he works outside of the house not to bring the virus into her.  She pretty much stays home.  She has dyed her hair orange the same color as her outfit today.   She is anxious to receive the vaccine but of course she is not yet 64 so she is not in the current year.  A detailed review of systems today was otherwise stable.   BREAST CANCER HISTORY: From the original intake note:  [name] herself noted a change in her right breast in October and brought it to the attention of her primary care physician. Her last mammogram had been April 2010. Dr. Sarajane Jews set [name] up for bilateral diagnostic mammography with tomosynthesis and [facility] 04[facility]11[facility]2015. This found the breast density to be category B. In the right breast there were 3 microlobulated masses in the lower inner quadrant measuring 1.9, 1.5, and 1.5 cm. These were multicentric. There were also diffuse fine pleomorphic calcifications surrounding these masses, that area spanning 11.6 cm. The masses were palpable by exam at 4:00 8 cm from the nipple and at 5:00 3 cm from the nipple. By ultrasonography there was an irregular hypoechoic mass in the right breast at 4:00 measuring 1.7 cm, 1 medial to this measuring 1.1 cm, and then the third irregular hypoechoic mass at the 5:00 position measuring 1.3 cm. The right axilla showed multiple lymph nodes with thickened cortices. The largest lymph node measured 0.9 cm.  On 08/02/2015, [name] underwent biopsy of all 3 breast masses as well as a right axillary lymph node. 2  of the tumors were similar invasive ductal carcinomas, grade 2, estrogen receptor 95% positive, progesterone receptor 5% positive, with an MIB-1 of 10%, and HER-2 equivocal, with a signals ratio of 1.30 and the number per cell 4.0. The third tumor appeared's  grade 1 or 2 was also estrogen receptor positive at 95%, progesterone receptor positive at 5%, with an MIP-1 of 5%, and a similar HER-2 profile the signals ratio being 1.46 but the number per cell being only 3.95. By immunohistochemistry on this tumor HER-2 was negative at 1+. Immunohistochemistry on the equivocal reading is pending.  Loralye's subsequent history is as detailed below   PAST MEDICAL HISTORY: Past Medical History:  Diagnosis Date  . Breast cancer ([place]) 2017   right breast  . Breast cancer of lower-inner quadrant of right female breast ([facility]) 08/08/2015  . Colon polyps   . DDD (degenerative disc disease), cervical   . Degenerative joint disease   . Diabetes mellitus without complication ([place])   . Diverticulosis   . Gallstones   . GERD (gastroesophageal reflux disease)   . Headache(784.0)    migraine like per patient  . Hypertension   . Low back pain    dr phillips, pain management  . Osteoarthritis   . Personal history of chemotherapy 2017  . Personal history of radiation therapy 2017    PAST SURGICAL HISTORY: Past Surgical History:  Procedure Laterality Date  . CHOLECYSTECTOMY  10/28/2011   Procedure: LAPAROSCOPIC CHOLECYSTECTOMY WITH INTRAOPERATIVE CHOLANGIOGRAM;  Surgeon: Earnstine Regal, MD;  Location: [facility];  Service: General;  Laterality: N/A;  . COLONOSCOPY WITH ESOPHAGOGASTRODUODENOSCOPY (EGD)  04/13/2018   per Dr. Henrene Pastor, adenomatous polyp, repeat in 5 yrs   . EXAM UNDER ANESTHESIA WITH MANIPULATION OF KNEE Right 05/30/2003  . EXAM UNDER ANESTHESIA WITH MANIPULATION OF KNEE Left 12/27/2002  . GANGLION CYST EXCISION Right    right wrist  . KNEE ARTHROSCOPY Bilateral 01/02/2000  . KNEE ARTHROSCOPY Right   . LAPAROSCOPIC VAGINAL HYSTERECTOMY WITH SALPINGO OOPHORECTOMY Bilateral 07/13/2006  . LIPOMA EXCISION Right 02/21/2008   hip  . MASS EXCISION  08/24/2012   Procedure: EXCISION MASS;  Surgeon: Earnstine Regal, MD;  Location: [facility];   Service: General;  Laterality: Left;  excisie soft tissue masses left shoulder(back) & left upper arm  . MASS EXCISION Left 01/24/2014   Procedure: EXCISION SOFT TISSUE MASSES LOWER LEFT BACK;  Surgeon: Earnstine Regal, MD;  Location: [facility];  Service: General;  Laterality: Left;  Marland Kitchen MASS EXCISION Right 04/30/2016   Procedure: EXCISION OF SKIN RIGHT CHEST WALL;  Surgeon: Autumn Messing III, MD;  Location: [facility];  Service: General;  Laterality: Right;  EXCISION OF SKIN RIGHT CHEST WALL  . MASTECTOMY Right 2017  . MASTECTOMY W/ SENTINEL NODE BIOPSY Right 10/01/2015   Procedure: RIGHT MASTECTOMY WITH SENTINEL LYMPH NODE BIOPSY;  Surgeon: Autumn Messing III, MD;  Location: [facility];  Service: General;  Laterality: Right;  . PORT-A-CATH REMOVAL Left 04/30/2016   Procedure: REMOVAL PORT-A-CATH;  Surgeon: Autumn Messing III, MD;  Location: [facility];  Service: General;  Laterality: Left;  REMOVAL PORT-A-CATH  . PORTACATH PLACEMENT Left 11/01/2015   Procedure: INSERTION PORT-A-CATH;  Surgeon: Autumn Messing III, MD;  Location: [facility];  Service: General;  Laterality: Left;  . REPLACEMENT UNICONDYLAR JOINT KNEE Left 04/20/2001  . REVISION TOTAL KNEE ARTHROPLASTY Right 06/18/2004  . REVISION TOTAL KNEE ARTHROPLASTY Left 11/15/2002  . REVISION TOTAL KNEE ARTHROPLASTY  Left 10/12/2001  . REVISION TOTAL KNEE ARTHROPLASTY Right 07/09/2015  . TONSILLECTOMY    . TOTAL KNEE ARTHROPLASTY Right 04/25/2003  . TOTAL KNEE ARTHROPLASTY Left 06/25/2009    FAMILY HISTORY Family History  Problem Relation Age of Onset  . Stomach cancer Mother   . Heart disease Father   . Prostate cancer Father   . Kidney cancer Sister        one out of the four  . Hypertension Sister   . Hypertension Brother   . Crohn's disease Other        nephew  . Colon cancer Maternal Grandmother   . Esophageal cancer Neg Hx    The patient's father died at the age of 3 with congestive heart failure.  Her mother is still living as of 02/032020, age 63. The patient had 6 brothers, 4 sisters. One sister was diagnosed with kidney cancer at age 31. ([place]). She is doing well. The patient's maternal grandmother was diagnosed with colon cancer at the age of 19. The patient's mother was diagnosed with a "rare stomach cancer" 20 years ago. The patient's father was diagnosed with prostate cancer at age 15. There is no history of breast or ovarian cancer in the family to her knowledge.    GYNECOLOGIC HISTORY:  No LMP recorded. Patient has had a hysterectomy.  menarche age 73, first live birth age 79, the patient is GX P3. She underwent remote hysterectomy with bilateral salpingo-oophorectomy. She did not use hormone replacement. She never used oral contraceptives.    SOCIAL HISTORY:  Adoria used to work at [employer] but she is now disabled because of her multiple arthritic and degenerative problems. Her husband [name] works as a Freight forwarder. Son [name] works in long care in [place]. Son [name] works for a Arboriculturist in [place]. Daughter  [name] is a Haematologist in [place]. The patient has 5 grandchildren. She attends a Physicist, medical church    ADVANCED DIRECTIVES: Not in place   HEALTH MAINTENANCE: Social History   Tobacco Use  . Smoking status: Current Every Day Smoker    Packs/day: 0.25    Years: 20.00    Pack years: 5.00    Types: Cigarettes  . Smokeless tobacco: Former Systems developer    Quit date: 09/30/2015  . Tobacco comment: smokes about 4 cigs/day; working on quitting - 08/22/16 gwd  Substance Use Topics  . Alcohol use: No    Alcohol/week: 0.0 standard drinks  . Drug use: No     Colonoscopy:February 2016   ZHY:QMVHQI post hysterectomy   Bone density:  Lipid panel:  Allergies  Allergen Reactions  . Codeine Itching, Nausea And Vomiting and Other (See Comments)    Itching all over the body  . Latex Hives  .  Sulfonamide Derivatives Hives and Other (See Comments)    All over the body  . Gabapentin Other (See Comments)    headache  . Lyrica [Pregabalin] Other (See Comments)    headache    Current Outpatient Medications  Medication Sig Dispense Refill  . amitriptyline (ELAVIL) 100 MG tablet Take 100 mg by mouth at bedtime.    Marland Kitchen amLODipine (NORVASC) 10 MG tablet Take 1 tablet (10 mg total) by mouth daily. 90 tablet 3  . cephALEXin (KEFLEX) 500 MG capsule Take 1 capsule (500 mg total) by mouth 4 (four) times daily. 20 capsule 0  . lisinopril (ZESTRIL) 10 MG tablet Take 1 tablet (10 mg total) by mouth daily. [place]  tablet 3  . metFORMIN (GLUCOPHAGE) 500 MG tablet TAKE 1 TABLET BY MOUTH TWICE A DAY WITH A MEAL (Patient taking differently: Take 500 mg by mouth daily with breakfast. ) 60 tablet 3  . methadone (DOLOPHINE) 10 MG tablet Take 10 mg by mouth every 8 (eight) hours.     . metoprolol tartrate (LOPRESSOR) 50 MG tablet Take 1 tablet (50 mg total) by mouth 2 (two) times daily. 180 tablet 3  . omeprazole (PRILOSEC) 40 MG capsule TAKE 1 CAPSULE BY MOUTH EVERY DAY (Patient taking differently: Take 40 mg by mouth daily. ) 30 capsule 5  . ondansetron (ZOFRAN ODT) 4 MG disintegrating tablet Take 1 tablet (4 mg total) by mouth every 8 (eight) hours as needed for nausea or vomiting. 15 tablet 0  . OneTouch Delica Lancets 60V MISC USE TO TEST ONCE DAILY DX CODE E11.9 100 each 0  . ONETOUCH ULTRA test strip TEST ONCE DAILY 25 strip 3  . Oxycodone HCl 10 MG TABS Take 10 mg by mouth every 6 (six) hours as needed (for pain).     . potassium chloride SA (KLOR-CON M20) 20 MEQ tablet Take 1 tablet (20 mEq total) by mouth daily. 90 tablet 3  . tamoxifen (NOLVADEX) 20 MG tablet Take 20 mg by mouth daily.    Marland Kitchen zolpidem (AMBIEN) 10 MG tablet Take 2 tablets (20 mg total) by mouth at bedtime. 60 tablet 5   No current facility-administered medications for this visit.    OBJECTIVE: Morbidly obese African-American woman  who appears stated age  59:   10/27/19 1307  BP: (!) 153/82  Pulse: 80  Resp: 18  Temp: 98.9 F (37.2 C)  SpO2: 100%     Body mass index is 41.13 kg/m.    ECOG FS:1 - Symptomatic but completely ambulatory   Sclerae unicteric, EOMs intact Wearing a mask No cervical or supraclavicular adenopathy Lungs no rales or rhonchi Heart regular rate and rhythm Abd soft, nontender, positive bowel sounds MSK no focal spinal tenderness, no upper extremity lymphedema Neuro: nonfocal, well oriented, appropriate affect Breasts: The right breast is status post mastectomy.  It also received chest wall radiation.  The area remains somewhat irregular and hyperpigmented but there is no evidence of local recurrence.  The left breast is benign.  Both axillae are benign.   LAB RESULTS:  CMP     Component Value Date/Time   NA 143 10/24/2019 1102   NA 140 07/30/2017 1132   K 3.0 (LL) 10/24/2019 1102   K 3.3 (L) 07/30/2017 1132   CL 103 10/24/2019 1102   CO2 30 10/24/2019 1102   CO2 23 07/30/2017 1132   GLUCOSE 118 (H) 10/24/2019 1102   GLUCOSE 116 07/30/2017 1132   BUN 13 10/24/2019 1102   BUN 15.6 07/30/2017 1132   CREATININE 0.92 10/24/2019 1102   CREATININE 0.9 07/30/2017 1132   CALCIUM 8.5 (L) 10/24/2019 1102   CALCIUM 9.6 07/30/2017 1132   PROT 6.6 10/24/2019 1102   PROT 7.4 07/30/2017 1132   ALBUMIN 3.9 10/24/2019 1102   ALBUMIN 4.1 07/30/2017 1132   AST 17 10/24/2019 1102   AST 17 07/30/2017 1132   ALT 14 10/24/2019 1102   ALT 13 07/30/2017 1132   ALKPHOS 71 10/24/2019 1102   ALKPHOS 76 07/30/2017 1132   BILITOT 0.2 (L) 10/24/2019 1102   BILITOT 0.25 07/30/2017 1132   GFRNONAA >60 10/24/2019 1102   GFRAA >60 10/24/2019 1102    INo results found for: SPEP, UPEP  Lab Results  Component Value Date   WBC 6.2 10/24/2019   NEUTROABS 3.6 10/24/2019   HGB 11.9 (L) 10/24/2019   HCT 36.6 10/24/2019   MCV 90.4 10/24/2019   PLT 248 10/24/2019      Chemistry      Component  Value Date/Time   NA 143 10/24/2019 1102   NA 140 07/30/2017 1132   K 3.0 (LL) 10/24/2019 1102   K 3.3 (L) 07/30/2017 1132   CL 103 10/24/2019 1102   CO2 30 10/24/2019 1102   CO2 23 07/30/2017 1132   BUN 13 10/24/2019 1102   BUN 15.6 07/30/2017 1132   CREATININE 0.92 10/24/2019 1102   CREATININE 0.9 07/30/2017 1132      Component Value Date/Time   CALCIUM 8.5 (L) 10/24/2019 1102   CALCIUM 9.6 07/30/2017 1132   ALKPHOS 71 10/24/2019 1102   ALKPHOS 76 07/30/2017 1132   AST 17 10/24/2019 1102   AST 17 07/30/2017 1132   ALT 14 10/24/2019 1102   ALT 13 07/30/2017 1132   BILITOT 0.2 (L) 10/24/2019 1102   BILITOT 0.25 07/30/2017 1132       No results found for: LABCA2  No components found for: LABCA125  No results for input(s): INR in the last 168 hours.  Urinalysis    Component Value Date/Time   COLORURINE YELLOW 08/06/2019 2200   APPEARANCEUR CLEAR 08/06/2019 2200   LABSPEC 1.014 08/06/2019 2200   LABSPEC 1.010 02/19/2016 1355   PHURINE 6.0 08/06/2019 2200   GLUCOSEU NEGATIVE 08/06/2019 2200   GLUCOSEU Negative 02/19/2016 1355   HGBUR MODERATE (A) 08/06/2019 2200   HGBUR large 07/01/2010 0945   BILIRUBINUR Negative 09/20/2019 1653   BILIRUBINUR Negative 02/19/2016 1355   KETONESUR NEGATIVE 08/06/2019 2200   PROTEINUR Positive (A) 09/20/2019 1653   PROTEINUR NEGATIVE 08/06/2019 2200   UROBILINOGEN 0.2 09/20/2019 1653   UROBILINOGEN 0.2 02/19/2016 1355   NITRITE Negative 09/20/2019 1653   NITRITE NEGATIVE 08/06/2019 2200   LEUKOCYTESUR Large (3+) (A) 09/20/2019 1653   LEUKOCYTESUR NEGATIVE 08/06/2019 2200   LEUKOCYTESUR Small 02/19/2016 1355    STUDIES: No results found.   ASSESSMENT: 59 y.o. [place] woman status post right breast lower inner quadrant biopsy 3 and axillary lymph node biopsy 08/02/2015 for a clinical mpT1c N0, stage IA invasive ductal carcinoma, grade 1 or 2, estrogen receptor 95% positive, progesterone receptor 5% positive, with an MIB-1  between 5 and 10%, and HER-2 equivocal on 1 of the 2 biopsies (signals ratio 1.30, number per cell 4.0).  (1) status post right modified radical mastectomy 10/01/2015 for an mpT2 pN0, stage IIA invasive ductal carcinoma, grade 2, with a total of 20 benign lymph nodes removed  (2) Oncotype DX score of 32 predicts an outside the breast recurrence risk of 22% within 10 years if the patient's only systemic therapy is tamoxifen for 5 years. It also predicts significant benefit from adjuvant chemotherapy.  (3) patient completed adjuvant doxorubicin and cyclophosphamide in dose dense fashion 4, last dose 12/24/2015, followed by paclitaxel weekly starting 01/07/2016  (a) completed 10 of 12 planned paclitaxel doses, final 2 doses omitted because of neuropathy concerns    (4) postmastectomy radiation 06/02/16-07/17/16 1.  The right chest wall and supraclavicular region was treated to 50.4 Gy in 28 fractions of 1.8 Gy 2.  The mastectomy site was boosted to 60.4 Gy with 5 fractions of 2 Gy  (5) started anastrozole 08/22/2016, switched to tamoxifen 08/10/2018  (a) bone density 10/02/2016 shows a T score of -0.4,  in the normal range.    PLAN: [name] is now a little over 4 years out from definitive surgery for her breast cancer with no evidence of disease recurrence.  This is very favorable.  She is tolerating tamoxifen well and the plan is to continue that for a total of 5 years after which she will "graduate".  I think she would benefit from low-dose venlafaxine for her hot flashes I put that prescription in for her.  She has a good understanding of the possible toxicities side effects and complications and knows not to stop it abruptly.  If she wants to get off that medication she will call us.  Otherwise she will see me one last time in March of next year.  Total encounter time 25 minutes.*   Hanan Moen, Virgie Dad, MD  10/27/19 1:25 PM Medical Oncology and Hematology [facility] [place], [place] [address] Tel. [phone]    Fax. [phone]   I, Wilburn Mylar, am acting as scribe for Dr. Virgie Dad. Suttyn Cryder.  I, Lurline Del MD, have reviewed the above documentation for accuracy and completeness, and I agree with the above.    *Total Encounter Time as defined by the Centers for Medicare and Medicaid Services includes, in addition to the face-to-face time of a patient visit (documented in the note above) non-face-to-face time: obtaining and reviewing outside history, ordering and reviewing medications, tests or procedures, care coordination (communications with other health care professionals or caregivers) and documentation in the medical record.

## 2019-10-27 ENCOUNTER — Ambulatory Visit: Payer: 59 | Admitting: Oncology

## 2019-10-27 ENCOUNTER — Inpatient Hospital Stay (HOSPITAL_BASED_OUTPATIENT_CLINIC_OR_DEPARTMENT_OTHER): Payer: 59 | Admitting: Oncology

## 2019-10-27 ENCOUNTER — Other Ambulatory Visit: Payer: Self-pay

## 2019-10-27 VITALS — BP 153/82 | HR 80 | Temp 98.9°F | Resp 18 | Ht 67.0 in | Wt 262.6 lb

## 2019-10-27 DIAGNOSIS — Z170 Estrogen receptor positive status [ER+]: Secondary | ICD-10-CM

## 2019-10-27 DIAGNOSIS — E119 Type 2 diabetes mellitus without complications: Secondary | ICD-10-CM

## 2019-10-27 DIAGNOSIS — Z6841 Body Mass Index (BMI) 40.0 and over, adult: Secondary | ICD-10-CM

## 2019-10-27 DIAGNOSIS — M4726 Other spondylosis with radiculopathy, lumbar region: Secondary | ICD-10-CM | POA: Diagnosis not present

## 2019-10-27 DIAGNOSIS — C50311 Malignant neoplasm of lower-inner quadrant of right female breast: Secondary | ICD-10-CM

## 2019-10-27 MED ORDER — VENLAFAXINE HCL ER 37.5 MG PO CP24: 37.5000 mg | ORAL_CAPSULE | Freq: Every day | ORAL | 4 refills | Status: DC

## 2019-10-27 MED ORDER — TAMOXIFEN CITRATE 20 MG PO TABS: 20.0000 mg | ORAL_TABLET | Freq: Every day | ORAL | 4 refills | Status: DC

## 2019-10-28 ENCOUNTER — Telehealth: Payer: Self-pay | Admitting: Oncology

## 2019-10-28 NOTE — Telephone Encounter (Signed)
I talk with patient regarding schedule  

## 2019-10-31 ENCOUNTER — Ambulatory Visit: Payer: 59

## 2019-10-31 ENCOUNTER — Other Ambulatory Visit: Payer: Self-pay | Admitting: Family Medicine

## 2019-10-31 DIAGNOSIS — E119 Type 2 diabetes mellitus without complications: Secondary | ICD-10-CM

## 2019-11-09 ENCOUNTER — Telehealth: Payer: Self-pay | Admitting: *Deleted

## 2019-11-09 NOTE — Telephone Encounter (Signed)
This RN spoke with pt per her call stating she has had 2 episodes of elevated bp up to 215/109 and then 198/90.  She states only change recently was starting the venlafaxine on 10/28/2019 with dose of 37.5mg .  She stated yesterday she had a headache- today she does not.  She states she is on 3 BP medications and is compliant with taking.  This RN advised her to stop the venlafaxine now ( she has been on for less then 2 weeks )   She needs to notify her MD who monitors her BP as well as continue to check her BP at home.  Pt verbalized understanding.

## 2019-11-11 ENCOUNTER — Ambulatory Visit: Payer: 59 | Admitting: Specialist

## 2019-11-11 ENCOUNTER — Other Ambulatory Visit: Payer: Self-pay | Admitting: Oncology

## 2019-11-11 NOTE — Progress Notes (Signed)
Daleen felt that her blood pressure was getting under control and has stopped the venlafaxine.

## 2019-11-21 ENCOUNTER — Other Ambulatory Visit: Payer: Self-pay | Admitting: Family Medicine

## 2019-11-24 ENCOUNTER — Other Ambulatory Visit: Payer: Self-pay

## 2019-11-24 ENCOUNTER — Ambulatory Visit (INDEPENDENT_AMBULATORY_CARE_PROVIDER_SITE_OTHER): Payer: 59 | Admitting: Specialist

## 2019-11-24 ENCOUNTER — Encounter: Payer: Self-pay | Admitting: Specialist

## 2019-11-24 VITALS — BP 140/85 | HR 68 | Ht 67.0 in | Wt 263.0 lb

## 2019-11-24 DIAGNOSIS — Z981 Arthrodesis status: Secondary | ICD-10-CM

## 2019-11-24 DIAGNOSIS — M4722 Other spondylosis with radiculopathy, cervical region: Secondary | ICD-10-CM

## 2019-11-24 DIAGNOSIS — M48062 Spinal stenosis, lumbar region with neurogenic claudication: Secondary | ICD-10-CM | POA: Diagnosis not present

## 2019-11-24 DIAGNOSIS — Q7649 Other congenital malformations of spine, not associated with scoliosis: Secondary | ICD-10-CM

## 2019-11-24 DIAGNOSIS — M5136 Other intervertebral disc degeneration, lumbar region: Secondary | ICD-10-CM | POA: Diagnosis not present

## 2019-11-24 DIAGNOSIS — M5030 Other cervical disc degeneration, unspecified cervical region: Secondary | ICD-10-CM | POA: Diagnosis not present

## 2019-11-24 DIAGNOSIS — M542 Cervicalgia: Secondary | ICD-10-CM

## 2019-11-24 NOTE — Progress Notes (Addendum)
Office Visit Note   Patient: [name]           Date of Birth: 10-04-1960           MRN: [id number] Visit Date: 11/24/2019              Requested by: Laurey Morale, MD [place],  [place] [address] PCP: Laurey Morale, MD   Assessment & Plan: Visit Diagnoses:  1. Degenerative disc disease, lumbar   2. Spinal stenosis of lumbar region with neurogenic claudication   3. Degenerative disc disease, cervical   4. Other cervical disc degeneration, unspecified cervical region   5. Cervicalgia   6. Congenital stenosis of lumbar spine   7. Other spondylosis with radiculopathy, cervical region   8. S/P lumbar fusion     Plan: Avoid frequent bending and stooping  No lifting greater than 10 lbs. May use ice or moist heat for pain. Weight loss is of benefit. Best medication for lumbar disc disease is arthritis medications like motrin, celebrex and naprosyn. Exercise is important to improve your indurance and does allow people to function better inspite of back pain.    Follow-Up Instructions: No follow-ups on file.   Orders:  No orders of the defined types were placed in this encounter.  No orders of the defined types were placed in this encounter.     Procedures: No procedures performed   Clinical Data: No additional findings.   Subjective: Chief Complaint  Patient presents with  . Right Knee - Follow-up  . Left Knee - Follow-up  . Lower Back - Follow-up    59 year old female with history of bilateral knee replacements and revisions, has had 4 surgeries on each knee with the last by Dr. Lester Kinsman at [facility] about 4 years on the left and 7 years ago on the right side. She is able to grocery shop and uses a cane for ambulation longer distances uses a cane to help with the left knee some times hyperextending on her. No bowel or bladder difficulty. She has some numbness and tingling of the feet. She is using amytriptyline and it helps  100 mg po    Qhs.    Review of Systems   Objective: Vital Signs: BP 140/85 (BP Location: Left Arm, Patient Position: Sitting)   Pulse 68   Ht 5\' 7"  (1.702 m)   Wt 263 lb (119.3 kg)   BMI 41.19 kg/m   Physical Exam Musculoskeletal:     Lumbar back: Negative right straight leg raise test and negative left straight leg raise test.     Back Exam   Tenderness  The patient is experiencing tenderness in the lumbar.  Range of Motion  Extension: abnormal  Flexion: abnormal  Lateral bend right: abnormal  Lateral bend left: abnormal  Rotation right: abnormal  Rotation left: abnormal   Muscle Strength  Right Quadriceps:  5/5  Left Quadriceps:  5/5  Right Hamstrings:  5/5  Left Hamstrings:  5/5   Tests  Straight leg raise right: negative Straight leg raise left: negative  Reflexes  Patellar: 0/4 Achilles: 0/4 Biceps: 0/4  Other  Toe walk: normal Heel walk: normal Sensation: normal Gait: normal  Erythema: no back redness Scars: absent  Comments:  Mild right greater than left knee swelling      Specialty Comments:  No specialty comments available.  Imaging: No results found.   PMFS History: Patient Active Problem List   Diagnosis Date Noted  .  DDD (degenerative disc disease), lumbar 07/16/2018    Priority: High    Class: Chronic  . Other spondylosis with radiculopathy, lumbar region 07/16/2018    Priority: High    Class: Chronic  . S/P lumbar fusion 07/13/2018  . Tenosynovitis, de Quervain 08/10/2017  . 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 Insomnia 07/16/2016  . Left groin pain 11/30/2015  . Morbid obesity with BMI of 40.0-44.9, adult ([place]) 10/26/2015  . Malignant neoplasm of lower-inner quadrant of right breast of female, estrogen receptor positive ([place]) 08/08/2015  . S/P revision of total knee 07/24/2015  . Acute postoperative pain 07/11/2015  . Loose total knee arthroplasty ([place]) 07/02/2015  . Type 2 diabetes mellitus without complications ([place]) [id number]   . Adhesive capsulitis of left shoulder 10/11/2014  . Paraspinous mass, left 03/31/2013  . Other symptoms and signs involving the nervous system 03/31/2013  . Internal hemorrhoid 02/17/2013  . Abdominal pain, other specified site 02/17/2013  . Neoplasm of soft tissue, left shoulder and left upper arm 08/03/2012  . Lightheadedness 01/29/2012  . Cholelithiasis with cholecystitis 10/08/2011  . Hypokalemia 09/17/2011  . Gastro-esophageal reflux disease without esophagitis 08/08/2010  . Gout 12/18/2008  . Essential (primary) hypertension 03/15/2007  . OA (osteoarthritis) of knee 03/15/2007  . HEADACHE 03/15/2007   Past Medical History:  Diagnosis Date  . Breast cancer ([place]) 2017   right breast  . Breast cancer of lower-inner quadrant of right female breast ([place]) 08/08/2015  . Colon polyps   . DDD (degenerative disc disease), cervical   . Degenerative joint disease   . Diabetes mellitus without complication ([place])   . Diverticulosis   . Gallstones   . GERD (gastroesophageal reflux disease)   . Headache(784.0)    migraine like per patient  . Hypertension   . Low back pain    dr phillips, pain management  . Osteoarthritis   . Personal history of chemotherapy 2017  . Personal history of radiation therapy 2017    Family History  Problem Relation Age of Onset  . Stomach cancer Mother   . Heart disease Father   . Prostate cancer Father   . Kidney cancer Sister        one out of the four  . Hypertension Sister   . Hypertension Brother   . Crohn's disease Other        nephew  . Colon cancer Maternal Grandmother   . Esophageal cancer Neg Hx     Past Surgical History:  Procedure Laterality Date  . CHOLECYSTECTOMY  10/28/2011   Procedure: LAPAROSCOPIC CHOLECYSTECTOMY WITH INTRAOPERATIVE CHOLANGIOGRAM;  Surgeon: Earnstine Regal, MD;  Location: [facility];  Service: General;  Laterality: N/A;  . COLONOSCOPY WITH ESOPHAGOGASTRODUODENOSCOPY (EGD)  04/13/2018   per Dr. Henrene Pastor, adenomatous  polyp, repeat in 5 yrs   . EXAM UNDER ANESTHESIA WITH MANIPULATION OF KNEE Right 05/30/2003  . EXAM UNDER ANESTHESIA WITH MANIPULATION OF KNEE Left 12/27/2002  . GANGLION CYST EXCISION Right    right wrist  . KNEE ARTHROSCOPY Bilateral 01/02/2000  . KNEE ARTHROSCOPY Right   . LAPAROSCOPIC VAGINAL HYSTERECTOMY WITH SALPINGO OOPHORECTOMY Bilateral 07/13/2006  . LIPOMA EXCISION Right 02/21/2008   hip  . MASS EXCISION  08/24/2012   Procedure: EXCISION MASS;  Surgeon: Earnstine Regal, MD;  Location: [facility];  Service: General;  Laterality: Left;  excisie soft tissue masses left shoulder(back) & left upper arm  . MASS EXCISION Left 01/24/2014   Procedure: EXCISION SOFT TISSUE MASSES LOWER LEFT BACK;  Surgeon:  Earnstine Regal, MD;  Location: [facility];  Service: General;  Laterality: Left;  Marland Kitchen MASS EXCISION Right 04/30/2016   Procedure: EXCISION OF SKIN RIGHT CHEST WALL;  Surgeon: Autumn Messing III, MD;  Location: [facility];  Service: General;  Laterality: Right;  EXCISION OF SKIN RIGHT CHEST WALL  . MASTECTOMY Right 2017  . MASTECTOMY W/ SENTINEL NODE BIOPSY Right 10/01/2015   Procedure: RIGHT MASTECTOMY WITH SENTINEL LYMPH NODE BIOPSY;  Surgeon: Autumn Messing III, MD;  Location: [facility];  Service: General;  Laterality: Right;  . PORT-A-CATH REMOVAL Left 04/30/2016   Procedure: REMOVAL PORT-A-CATH;  Surgeon: Autumn Messing III, MD;  Location: [facility];  Service: General;  Laterality: Left;  REMOVAL PORT-A-CATH  . PORTACATH PLACEMENT Left 11/01/2015   Procedure: INSERTION PORT-A-CATH;  Surgeon: Autumn Messing III, MD;  Location: [facility];  Service: General;  Laterality: Left;  . REPLACEMENT UNICONDYLAR JOINT KNEE Left 04/20/2001  . REVISION TOTAL KNEE ARTHROPLASTY Right 06/18/2004  . REVISION TOTAL KNEE ARTHROPLASTY Left 11/15/2002  . REVISION TOTAL KNEE ARTHROPLASTY Left 10/12/2001  . REVISION TOTAL KNEE ARTHROPLASTY Right 07/09/2015  .  TONSILLECTOMY    . TOTAL KNEE ARTHROPLASTY Right 04/25/2003  . TOTAL KNEE ARTHROPLASTY Left 06/25/2009   Social History   Occupational History  . Occupation: retired/disabled  Tobacco Use  . Smoking status: Current Every Day Smoker    Packs/day: 0.25    Years: 20.00    Pack years: 5.00    Types: Cigarettes  . Smokeless tobacco: Former Systems developer    Quit date: 09/30/2015  . Tobacco comment: smokes about 4 cigs/day; working on quitting - 08/22/16 gwd  Substance and Sexual Activity  . Alcohol use: No    Alcohol/week: 0.0 standard drinks  . Drug use: No  . Sexual activity: Yes    Birth control/protection: Post-menopausal

## 2019-12-06 ENCOUNTER — Other Ambulatory Visit: Payer: Self-pay | Admitting: Family Medicine

## 2019-12-06 DIAGNOSIS — E119 Type 2 diabetes mellitus without complications: Secondary | ICD-10-CM

## 2019-12-07 ENCOUNTER — Ambulatory Visit
Admission: RE | Admit: 2019-12-07 | Discharge: 2019-12-07 | Disposition: A | Discharge status: Home / Self Care | Payer: 59 | Source: Ambulatory Visit | Attending: Oncology | Admitting: Oncology

## 2019-12-07 ENCOUNTER — Other Ambulatory Visit: Payer: Self-pay

## 2019-12-07 DIAGNOSIS — Z1231 Encounter for screening mammogram for malignant neoplasm of breast: Secondary | ICD-10-CM

## 2019-12-11 ENCOUNTER — Other Ambulatory Visit: Payer: Self-pay | Admitting: Family Medicine

## 2020-01-11 ENCOUNTER — Other Ambulatory Visit: Payer: Self-pay | Admitting: Family Medicine

## 2020-01-11 DIAGNOSIS — E119 Type 2 diabetes mellitus without complications: Secondary | ICD-10-CM

## 2020-01-13 ENCOUNTER — Telehealth: Payer: Self-pay | Admitting: *Deleted

## 2020-01-13 NOTE — Telephone Encounter (Signed)
Patient called after hours line. Patient reports her nephew stays with her and his daughter has tested positive for COVID and now she is scared and wants to be tested for COVID to be on the safe side. She is a breast cancer survivor. Caller wants to know where she can go to be tested. No current symptoms.

## 2020-01-13 NOTE — Telephone Encounter (Signed)
Spoke with the patient. Information for [facility] as well as the [facility] covid clinic were given. Nothing further needed.

## 2020-02-02 ENCOUNTER — Other Ambulatory Visit: Payer: Self-pay | Admitting: Family Medicine

## 2020-02-24 ENCOUNTER — Other Ambulatory Visit: Payer: Self-pay | Admitting: Family Medicine

## 2020-02-24 DIAGNOSIS — E119 Type 2 diabetes mellitus without complications: Secondary | ICD-10-CM

## 2020-02-25 ENCOUNTER — Encounter (HOSPITAL_COMMUNITY): Payer: Self-pay | Admitting: Emergency Medicine

## 2020-02-25 ENCOUNTER — Emergency Department (HOSPITAL_COMMUNITY)
Admission: EM | Admit: 2020-02-25 | Discharge: 2020-02-25 | Disposition: A | Discharge status: Home / Self Care | Payer: 59 | Attending: Emergency Medicine | Admitting: Emergency Medicine

## 2020-02-25 ENCOUNTER — Emergency Department (HOSPITAL_COMMUNITY): Payer: 59

## 2020-02-25 ENCOUNTER — Other Ambulatory Visit: Payer: Self-pay

## 2020-02-25 DIAGNOSIS — Z96653 Presence of artificial knee joint, bilateral: Secondary | ICD-10-CM | POA: Insufficient documentation

## 2020-02-25 DIAGNOSIS — Z85831 Personal history of malignant neoplasm of soft tissue: Secondary | ICD-10-CM | POA: Insufficient documentation

## 2020-02-25 DIAGNOSIS — Z7984 Long term (current) use of oral hypoglycemic drugs: Secondary | ICD-10-CM | POA: Insufficient documentation

## 2020-02-25 DIAGNOSIS — M545 Low back pain, unspecified: Secondary | ICD-10-CM

## 2020-02-25 DIAGNOSIS — E876 Hypokalemia: Secondary | ICD-10-CM | POA: Diagnosis not present

## 2020-02-25 DIAGNOSIS — Z79899 Other long term (current) drug therapy: Secondary | ICD-10-CM | POA: Diagnosis not present

## 2020-02-25 DIAGNOSIS — F17210 Nicotine dependence, cigarettes, uncomplicated: Secondary | ICD-10-CM | POA: Diagnosis not present

## 2020-02-25 DIAGNOSIS — Z91040 Latex allergy status: Secondary | ICD-10-CM | POA: Diagnosis not present

## 2020-02-25 DIAGNOSIS — Z853 Personal history of malignant neoplasm of breast: Secondary | ICD-10-CM | POA: Insufficient documentation

## 2020-02-25 DIAGNOSIS — E119 Type 2 diabetes mellitus without complications: Secondary | ICD-10-CM | POA: Insufficient documentation

## 2020-02-25 DIAGNOSIS — R109 Unspecified abdominal pain: Secondary | ICD-10-CM | POA: Insufficient documentation

## 2020-02-25 DIAGNOSIS — I10 Essential (primary) hypertension: Secondary | ICD-10-CM | POA: Insufficient documentation

## 2020-02-25 LAB — CBC
HCT: 38.2 % (ref 36.0–46.0)
Hemoglobin: 11.4 g/dL — ABNORMAL LOW (ref 12.0–15.0)
MCH: 30.4 pg (ref 26.0–34.0)
MCHC: 29.8 g/dL — ABNORMAL LOW (ref 30.0–36.0)
MCV: 101.9 fL — ABNORMAL HIGH (ref 80.0–100.0)
Platelets: 203 10*3/uL (ref 150–400)
RBC: 3.75 MIL/uL — ABNORMAL LOW (ref 3.87–5.11)
RDW: 13.5 % (ref 11.5–15.5)
WBC: 7.2 10*3/uL (ref 4.0–10.5)
nRBC: 0 % (ref 0.0–0.2)

## 2020-02-25 LAB — BASIC METABOLIC PANEL
Anion gap: 11 (ref 5–15)
BUN: 13 mg/dL (ref 6–20)
CO2: 29 mmol/L (ref 22–32)
Calcium: 8.3 mg/dL — ABNORMAL LOW (ref 8.9–10.3)
Chloride: 100 mmol/L (ref 98–111)
Creatinine, Ser: 0.9 mg/dL (ref 0.44–1.00)
GFR calc Af Amer: >60 mL/min (ref >60)
GFR calc non Af Amer: >60 mL/min (ref >60)
Glucose, Bld: 95 mg/dL (ref 70–99)
Potassium: 2.8 mmol/L — ABNORMAL LOW (ref 3.5–5.1)
Sodium: 140 mmol/L (ref 135–145)

## 2020-02-25 LAB — URINALYSIS, ROUTINE W REFLEX MICROSCOPIC
Bilirubin Urine: NEGATIVE
Glucose, UA: NEGATIVE mg/dL
Hgb urine dipstick: NEGATIVE
Ketones, ur: NEGATIVE mg/dL
Leukocytes,Ua: NEGATIVE
Nitrite: NEGATIVE
Protein, ur: NEGATIVE mg/dL
Specific Gravity, Urine: 1.017 (ref 1.005–1.030)
pH: 7 (ref 5.0–8.0)

## 2020-02-25 MED ORDER — ONDANSETRON HCL 4 MG/2ML IJ SOLN
4.0000 mg | Freq: Once | INTRAMUSCULAR | Status: DC
  Filled 2020-02-25: qty 2

## 2020-02-25 MED ORDER — POTASSIUM CHLORIDE CRYS ER 20 MEQ PO TBCR
20.0000 meq | EXTENDED_RELEASE_TABLET | Freq: Every day | ORAL | 0 refills | Status: DC
Start: 2020-02-25 — End: 2020-02-25

## 2020-02-25 MED ORDER — POTASSIUM CHLORIDE CRYS ER 20 MEQ PO TBCR
40.0000 meq | EXTENDED_RELEASE_TABLET | Freq: Once | ORAL | Status: AC
  Administered 2020-02-25: 40 meq via ORAL
  Filled 2020-02-25: qty 2

## 2020-02-25 MED ORDER — METOCLOPRAMIDE HCL 5 MG/ML IJ SOLN
10.0000 mg | Freq: Once | INTRAMUSCULAR | Status: AC
  Administered 2020-02-25: 10 mg via INTRAVENOUS
  Filled 2020-02-25: qty 2

## 2020-02-25 MED ORDER — POTASSIUM CHLORIDE 10 MEQ/100ML IV SOLN
10.0000 meq | INTRAVENOUS | Status: AC
  Administered 2020-02-25 (×2): 10 meq via INTRAVENOUS
  Filled 2020-02-25 (×2): qty 100

## 2020-02-25 MED ORDER — HYDROMORPHONE HCL 1 MG/ML IJ SOLN: 2.0000 mg | Freq: Once | INTRAMUSCULAR | Status: DC

## 2020-02-25 MED ORDER — POTASSIUM CHLORIDE CRYS ER 20 MEQ PO TBCR
20.0000 meq | EXTENDED_RELEASE_TABLET | Freq: Every day | ORAL | 0 refills | Status: DC
Start: 2020-02-25 — End: 2020-07-19

## 2020-02-25 MED ORDER — ONDANSETRON HCL 4 MG/2ML IJ SOLN
4.0000 mg | Freq: Once | INTRAMUSCULAR | Status: AC
  Administered 2020-02-25: 4 mg via INTRAVENOUS

## 2020-02-25 MED ORDER — CELECOXIB 200 MG PO CAPS
200.0000 mg | ORAL_CAPSULE | Freq: Two times a day (BID) | ORAL | 0 refills | Status: DC
Start: 2020-02-25 — End: 2020-07-19

## 2020-02-25 MED ORDER — ONDANSETRON 4 MG PO TBDP: 4.0000 mg | ORAL_TABLET | Freq: Once | ORAL | Status: DC

## 2020-02-25 MED ORDER — LIDOCAINE 5 % EX PTCH
1.0000 | MEDICATED_PATCH | CUTANEOUS | 0 refills | Status: DC
Start: 2020-02-25 — End: 2020-02-25

## 2020-02-25 MED ORDER — LIDOCAINE 5 % EX PTCH: 1.0000 | MEDICATED_PATCH | CUTANEOUS | 0 refills | Status: DC

## 2020-02-25 MED ORDER — HYDROMORPHONE HCL 1 MG/ML IJ SOLN
1.0000 mg | Freq: Once | INTRAMUSCULAR | Status: AC
  Administered 2020-02-25: 1 mg via INTRAVENOUS
  Filled 2020-02-25: qty 1

## 2020-02-25 NOTE — ED Provider Notes (Signed)
[facility] EMERGENCY DEPARTMENT Provider Note   CSN: [id number] Arrival date & time: 02/25/20  1521     History No chief complaint on file.   [name] is a 59 y.o. female with a pmh of chronic lumbar pain, degenerative disc disease, status post lumbar fusion surgery who presents emergency department chief complaint of lower back pain.  Patient states that the pain radiates forward into her abdomen.  Is worse whenever she twists moves or changes position.  She states she is already on methadone and oxycodone and is not helping her pain.  She denies any noted injuries.  She denies urinary symptoms.  She denies chest pain, shortness of breath.  Pain is better when she lies still.  It does not radiate down into her legs.  HPI     Past Medical History:  Diagnosis Date  . Breast cancer ([place]) 2017   right breast  . Breast cancer of lower-inner quadrant of right female breast ([place]) 08/08/2015  . Colon polyps   . DDD (degenerative disc disease), cervical   . Degenerative joint disease   . Diabetes mellitus without complication ([place])   . Diverticulosis   . Gallstones   . GERD (gastroesophageal reflux disease)   . Headache(784.0)    migraine like per patient  . Hypertension   . Low back pain    dr phillips, pain management  . Osteoarthritis   . Personal history of chemotherapy 2017  . Personal history of radiation therapy 2017    Patient Active Problem List   Diagnosis Date Noted  . DDD (degenerative disc disease), lumbar 07/16/2018    Class: Chronic  . Other spondylosis with radiculopathy, lumbar region 07/16/2018    Class: Chronic  . S/P lumbar fusion 07/13/2018  . Tenosynovitis, de Quervain 08/10/2017  . 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 Insomnia 07/16/2016  . Left groin pain 11/30/2015  . Morbid obesity with BMI of 40.0-44.9, adult ([place]) 10/26/2015  . Malignant neoplasm of lower-inner quadrant of right breast of female, estrogen receptor positive  ([place]) 08/08/2015  . S/P revision of total knee 07/24/2015  . Acute postoperative pain 07/11/2015  . Loose total knee arthroplasty ([place]) 07/02/2015  . Type 2 diabetes mellitus without complications ([place]) 29/92/4268  . Adhesive capsulitis of left shoulder 10/11/2014  . Paraspinous mass, left 03/31/2013  . Other symptoms and signs involving the nervous system 03/31/2013  . Internal hemorrhoid 02/17/2013  . Abdominal pain, other specified site 02/17/2013  . Neoplasm of soft tissue, left shoulder and left upper arm 08/03/2012  . Lightheadedness 01/29/2012  . Cholelithiasis with cholecystitis 10/08/2011  . Hypokalemia 09/17/2011  . Gastro-esophageal reflux disease without esophagitis 08/08/2010  . Gout 12/18/2008  . Essential (primary) hypertension 03/15/2007  . OA (osteoarthritis) of knee 03/15/2007  . HEADACHE 03/15/2007    Past Surgical History:  Procedure Laterality Date  . CHOLECYSTECTOMY  10/28/2011   Procedure: LAPAROSCOPIC CHOLECYSTECTOMY WITH INTRAOPERATIVE CHOLANGIOGRAM;  Surgeon: Earnstine Regal, MD;  [facility];  Service: General;  Laterality: N/A;  . COLONOSCOPY WITH ESOPHAGOGASTRODUODENOSCOPY (EGD)  04/13/2018   per Dr. Henrene Pastor, adenomatous polyp, repeat in 5 yrs   . EXAM UNDER ANESTHESIA WITH MANIPULATION OF KNEE Right 05/30/2003  . EXAM UNDER ANESTHESIA WITH MANIPULATION OF KNEE Left 12/27/2002  . GANGLION CYST EXCISION Right    right wrist  . KNEE ARTHROSCOPY Bilateral 01/02/2000  . KNEE ARTHROSCOPY Right   . LAPAROSCOPIC VAGINAL HYSTERECTOMY WITH SALPINGO OOPHORECTOMY Bilateral 07/13/2006  . LIPOMA EXCISION Right 02/21/2008  hip  . MASS EXCISION  08/24/2012   Procedure: EXCISION MASS;  Surgeon: Earnstine Regal, MD;  Location: [facility];  Service: General;  Laterality: Left;  excisie soft tissue masses left shoulder(back) & left upper arm  . MASS EXCISION Left 01/24/2014   Procedure: EXCISION SOFT TISSUE MASSES LOWER LEFT BACK;  Surgeon: Earnstine Regal,  MD;  Location: [facility];  Service: General;  Laterality: Left;  Marland Kitchen MASS EXCISION Right 04/30/2016   Procedure: EXCISION OF SKIN RIGHT CHEST WALL;  Surgeon: Autumn Messing III, MD;  Location: [facility];  Service: General;  Laterality: Right;  EXCISION OF SKIN RIGHT CHEST WALL  . MASTECTOMY Right 2017  . MASTECTOMY W/ SENTINEL NODE BIOPSY Right 10/01/2015   Procedure: RIGHT MASTECTOMY WITH SENTINEL LYMPH NODE BIOPSY;  Surgeon: Autumn Messing III, MD;  Location: [facility];  Service: General;  Laterality: Right;  . PORT-A-CATH REMOVAL Left 04/30/2016   Procedure: REMOVAL PORT-A-CATH;  Surgeon: Autumn Messing III, MD;  Location: [facility];  Service: General;  Laterality: Left;  REMOVAL PORT-A-CATH  . PORTACATH PLACEMENT Left 11/01/2015   Procedure: INSERTION PORT-A-CATH;  Surgeon: Autumn Messing III, MD;  Location: [facility];  Service: General;  Laterality: Left;  . REPLACEMENT UNICONDYLAR JOINT KNEE Left 04/20/2001  . REVISION TOTAL KNEE ARTHROPLASTY Right 06/18/2004  . REVISION TOTAL KNEE ARTHROPLASTY Left 11/15/2002  . REVISION TOTAL KNEE ARTHROPLASTY Left 10/12/2001  . REVISION TOTAL KNEE ARTHROPLASTY Right 07/09/2015  . TONSILLECTOMY    . TOTAL KNEE ARTHROPLASTY Right 04/25/2003  . TOTAL KNEE ARTHROPLASTY Left 06/25/2009     OB History   No obstetric history on file.     Family History  Problem Relation Age of Onset  . Stomach cancer Mother   . Heart disease Father   . Prostate cancer Father   . Kidney cancer Sister        one out of the four  . Hypertension Sister   . Hypertension Brother   . Crohn's disease Other        nephew  . Colon cancer Maternal Grandmother   . Esophageal cancer Neg Hx     Social History   Tobacco Use  . Smoking status: Current Every Day Smoker    Packs/day: 0.25    Years: 20.00    Pack years: 5.00    Types: Cigarettes  . Smokeless tobacco: Former Systems developer    Quit date: 09/30/2015  . Tobacco comment: smokes about 4  cigs/day; working on quitting - 08/22/16 gwd  Substance Use Topics  . Alcohol use: No    Alcohol/week: 0.0 standard drinks  . Drug use: No    Home Medications Prior to Admission medications   Medication Sig Start Date End Date Taking? Authorizing Provider  amitriptyline (ELAVIL) 100 MG tablet Take 100 mg by mouth at bedtime. 05/24/19   [provider]  amLODipine (NORVASC) 10 MG tablet Take 1 tablet (10 mg total) by mouth daily. 09/09/19   Laurey Morale, MD  Lancets [facility] DELICA PLUS [id number]) MISC USE TO TEST ONCE DAILY DX CODE E11.9 01/11/20   Laurey Morale, MD  lisinopril (ZESTRIL) 10 MG tablet TAKE 1 TABLET BY MOUTH EVERY DAY 02/02/20   Laurey Morale, MD  metFORMIN (GLUCOPHAGE) 500 MG tablet TAKE 1 TABLET BY MOUTH TWICE A DAY WITH A MEAL 11/21/19   Laurey Morale, MD  methadone (DOLOPHINE) 10 MG tablet Take 10 mg by mouth every 8 (eight)  hours.  05/24/19   [provider]  metoprolol tartrate (LOPRESSOR) 50 MG tablet Take 1 tablet (50 mg total) by mouth 2 (two) times daily. 01/25/19   Laurey Morale, MD  omeprazole (PRILOSEC) 40 MG capsule TAKE 1 CAPSULE BY MOUTH EVERY DAY 12/12/19   Laurey Morale, MD  ondansetron (ZOFRAN ODT) 4 MG disintegrating tablet Take 1 tablet (4 mg total) by mouth every 8 (eight) hours as needed for nausea or vomiting. 09/20/19   Burchette, Alinda Sierras, MD  [facility] ULTRA test strip TEST ONCE DAILY 08/24/19   Laurey Morale, MD  Oxycodone HCl 10 MG TABS Take 10 mg by mouth every 6 (six) hours as needed (for pain).  07/22/19   [provider]  potassium chloride SA (KLOR-CON M20) 20 MEQ tablet Take 1 tablet (20 mEq total) by mouth daily. 09/09/19   Laurey Morale, MD  tamoxifen (NOLVADEX) 20 MG tablet Take 1 tablet (20 mg total) by mouth daily. 10/27/19   [name], Virgie Dad, MD  venlafaxine XR (EFFEXOR-XR) 37.5 MG 24 hr capsule Take 1 capsule (37.5 mg total) by mouth daily with breakfast. 10/27/19   [name], Virgie Dad, MD  zolpidem (AMBIEN) 10 MG  tablet Take 2 tablets (20 mg total) by mouth at bedtime. 09/09/19   Laurey Morale, MD    Allergies    Codeine, Latex, Sulfonamide derivatives, Gabapentin, and Lyrica [pregabalin]  Review of Systems   Review of Systems Ten systems reviewed and are negative for acute change, except as noted in the HPI.   Physical Exam Updated Vital Signs BP (!) 165/91   Pulse 75   Temp 97.9 F (36.6 C) (Oral)   Resp 14   SpO2 91%   Physical Exam Vitals and nursing note reviewed.  Constitutional:      General: She is not in acute distress.    Appearance: She is well-developed. She is not diaphoretic.  HENT:     Head: Normocephalic and atraumatic.  Eyes:     General: No scleral icterus.    Conjunctiva/sclera: Conjunctivae normal.  Cardiovascular:     Rate and Rhythm: Normal rate and regular rhythm.     Heart sounds: Normal heart sounds. No murmur. No friction rub. No gallop.   Pulmonary:     Effort: Pulmonary effort is normal. No respiratory distress.     Breath sounds: Normal breath sounds.  Abdominal:     General: Bowel sounds are normal. There is no distension.     Palpations: Abdomen is soft. There is no mass.     Tenderness: There is no abdominal tenderness. There is no right CVA tenderness, left CVA tenderness or guarding.  Musculoskeletal:     Cervical back: Normal range of motion.     Comments: No midline spinal tenderness.  Old incision over the left lumbar paraspinal region status post lipoma removal with palpable scar tissue.  Exquisite tenderness to palpation in this region.  No evidence of infection.  Pain is worse with twisting, bending or any movement of the lumbar spine.  Normal strength of the lower extremities.  Normal deep tendon reflexes.  Skin:    General: Skin is warm and dry.  Neurological:     Mental Status: She is alert and oriented to person, place, and time.  Psychiatric:        Behavior: Behavior normal.     ED Results / Procedures / Treatments   Labs (all  labs ordered are listed, but only abnormal results are displayed)  Labs Reviewed  BASIC METABOLIC PANEL - Abnormal; Notable for the following components:      Result Value   Potassium 2.8 (*)    Calcium 8.3 (*)    All other components within normal limits  CBC - Abnormal; Notable for the following components:   RBC 3.75 (*)    Hemoglobin 11.4 (*)    MCV 101.9 (*)    MCHC 29.8 (*)    All other components within normal limits  URINALYSIS, ROUTINE W REFLEX MICROSCOPIC    EKG None  Radiology CT Renal Stone Study  Result Date: 02/25/2020 CLINICAL DATA:  LEFT flank pain radiating to LEFT groin since Wednesday, nausea, suspected kidney stone EXAM: CT ABDOMEN AND PELVIS WITHOUT CONTRAST TECHNIQUE: Multidetector CT imaging of the abdomen and pelvis was performed following the standard protocol without IV contrast. Sagittal and coronal MPR images reconstructed from axial data set. Oral contrast not administered for this indication. COMPARISON:  02/18/2019 FINDINGS: Lower chest: Minimal bibasilar atelectasis Hepatobiliary: Gallbladder surgically absent.  Liver unremarkable. Pancreas: Normal appearance Spleen: Normal appearance Adrenals/Urinary Tract: Adrenal glands and RIGHT kidney normal appearance. Multiple LEFT renal cysts. No urinary tract calcification, hydronephrosis or hydroureter. Bladder unremarkable. Stomach/Bowel: Normal appendix. Stomach and bowel loops normal appearance. Vascular/Lymphatic: Atherosclerotic calcifications aorta and iliac arteries without aneurysmal dilatation. Few pelvic phleboliths. Reproductive: Uterus surgically absent with nonvisualization of ovaries. Other: No free air or free fluid. No hernia or inflammatory process. Musculoskeletal: Prior L4-S1 fusion.  No acute osseous findings. IMPRESSION: No evidence of urinary tract calcification or dilatation. No acute intra-abdominal or intrapelvic abnormalities. Electronically Signed   By: Lavonia Dana M.D.   On: 02/25/2020 18:15     Procedures Procedures (including critical care time)  Medications Ordered in ED Medications  HYDROmorphone (DILAUDID) injection 2 mg (2 mg Intravenous Not Given 02/25/20 1838)  ondansetron [facility]) injection 4 mg (4 mg Intravenous Not Given 02/25/20 1838)  ondansetron [facility]) injection 4 mg (4 mg Intravenous Given 02/25/20 1838)  potassium chloride SA (KLOR-CON) CR tablet 40 mEq (40 mEq Oral Given 02/25/20 1843)  potassium chloride 10 mEq in 100 mL IVPB (10 mEq Intravenous New Bag/Given 02/25/20 2032)  HYDROmorphone (DILAUDID) injection 1 mg (1 mg Intravenous Given 02/25/20 1838)  metoCLOPramide (REGLAN) injection 10 mg (10 mg Intravenous Given 02/25/20 2029)    ED Course  I have reviewed the triage vital signs and the nursing notes.  Pertinent labs & imaging results that were available during my care of the patient were reviewed by me and considered in my medical decision making (see chart for details).    MDM Rules/Calculators/A&P                     Patient here with acute on chronic lower back pain. The differential diagnosis of emergent flank pain includes, but is not limited to :Abdominal aortic aneurysm,, Renal artery embolism,Renal vein thrombosis, Aortic dissection, Mesenteric ischemia, Pyelonephritis, Renal infarction, Renal hemorrhage, Nephrolithiasis/ Renal Colic, Bladder tumor,Cystitis, Biliary colic, Pancreatitis Perforated peptic ulcer Appendicitis ,Inguinal Hernia, Diverticulitis, Bowel obstruction  Shingles Lower lobe pneumonia, Retroperitoneal hematoma/abscess/tumor, Epidural abscess, Epidural hematoma. She has reproducible lumbar paraspinal pain which is worse with movement.  I personally ordered interpreted and reviewed patient's labs which shows low potassium which appears to be a chronic issue for the patient.  This was repleted here in the emergency department both orally and by IV.  Patient CBC C shows a mildly low low hemoglobin at 11.4 which is macrocytic.  No other  significant  abnormalities, urine is clear without abnormality, no and evidence of infection.  I personally reviewed the images of a CT renal stone study which showed no acute abnormalities including acute musculoskeletal fractures.  Patient treated here in the emergency department for her hypokalemia and for pain.  Will be discharged with Celebrex, Lidoderm on top of her chronic pain medications.  She may follow-up with her primary care physician.  Final Clinical Impression(s) / ED Diagnoses Final diagnoses:  None    Rx / DC Orders ED Discharge Orders    None       Tabithia, Stroder, PA-C 02/25/20 2159    Truddie Hidden, MD 02/26/20 1152

## 2020-02-25 NOTE — ED Notes (Signed)
Pt transported to CT ?

## 2020-02-25 NOTE — Discharge Instructions (Addendum)
SEEK IMMEDIATE MEDICAL ATTENTION IF: New numbness, tingling, weakness, or problem with the use of your arms or legs.  Severe back pain not relieved with medications.  Change in bowel or bladder control.  Increasing pain in any areas of the body (such as chest or abdominal pain).  Shortness of breath, dizziness or fainting.  Nausea (feeling sick to your stomach), vomiting, fever, or sweats.  

## 2020-02-25 NOTE — ED Triage Notes (Signed)
C/o L flank pain that radiates to L groin x 1 week with nausea today.  Denies fever and chills.

## 2020-03-13 ENCOUNTER — Other Ambulatory Visit: Payer: Self-pay | Admitting: Family Medicine

## 2020-03-14 NOTE — Telephone Encounter (Signed)
Last filled 09/09/2019 Last OV 09/20/2019  Ok to fill?

## 2020-03-19 ENCOUNTER — Other Ambulatory Visit: Payer: Self-pay | Admitting: Family Medicine

## 2020-03-19 DIAGNOSIS — E119 Type 2 diabetes mellitus without complications: Secondary | ICD-10-CM

## 2020-03-19 NOTE — Telephone Encounter (Signed)
Please advise. Should the patient continue the zofran.  meds pended.

## 2020-03-19 NOTE — Telephone Encounter (Signed)
Pt's One Touch Glucose Monitor broke and would like a refill. Pt is also, requesting a refill on Zofran 4 mg. Pt uses [facility] on [address]. Thanks

## 2020-03-20 NOTE — Telephone Encounter (Signed)
Call in Zofran to use as needed, #15 with one rf. Also get her a new glucose monitor

## 2020-03-21 ENCOUNTER — Ambulatory Visit (HOSPITAL_COMMUNITY)
Admission: RE | Admit: 2020-03-21 | Discharge: 2020-03-21 | Disposition: A | Discharge status: Home / Self Care | Payer: 59 | Source: Ambulatory Visit | Attending: Anesthesiology | Admitting: Anesthesiology

## 2020-03-21 ENCOUNTER — Other Ambulatory Visit (HOSPITAL_COMMUNITY): Payer: Self-pay | Admitting: Anesthesiology

## 2020-03-21 DIAGNOSIS — M25559 Pain in unspecified hip: Secondary | ICD-10-CM

## 2020-03-21 MED ORDER — ONDANSETRON 4 MG PO TBDP: 4.0000 mg | ORAL_TABLET | Freq: Three times a day (TID) | ORAL | 0 refills | Status: DC | PRN

## 2020-04-10 ENCOUNTER — Other Ambulatory Visit: Payer: Self-pay | Admitting: Family Medicine

## 2020-04-12 ENCOUNTER — Emergency Department (HOSPITAL_COMMUNITY)
Admission: EM | Admit: 2020-04-12 | Discharge: 2020-04-12 | Disposition: A | Discharge status: Home / Self Care | Payer: 59 | Attending: Emergency Medicine | Admitting: Emergency Medicine

## 2020-04-12 ENCOUNTER — Encounter (HOSPITAL_COMMUNITY): Payer: Self-pay | Admitting: Emergency Medicine

## 2020-04-12 ENCOUNTER — Other Ambulatory Visit: Payer: Self-pay

## 2020-04-12 ENCOUNTER — Emergency Department (HOSPITAL_COMMUNITY): Payer: 59

## 2020-04-12 DIAGNOSIS — Z86010 Personal history of colonic polyps: Secondary | ICD-10-CM | POA: Diagnosis not present

## 2020-04-12 DIAGNOSIS — Z96653 Presence of artificial knee joint, bilateral: Secondary | ICD-10-CM | POA: Insufficient documentation

## 2020-04-12 DIAGNOSIS — Z923 Personal history of irradiation: Secondary | ICD-10-CM | POA: Insufficient documentation

## 2020-04-12 DIAGNOSIS — Z7984 Long term (current) use of oral hypoglycemic drugs: Secondary | ICD-10-CM | POA: Insufficient documentation

## 2020-04-12 DIAGNOSIS — F17210 Nicotine dependence, cigarettes, uncomplicated: Secondary | ICD-10-CM | POA: Diagnosis not present

## 2020-04-12 DIAGNOSIS — Z853 Personal history of malignant neoplasm of breast: Secondary | ICD-10-CM | POA: Diagnosis not present

## 2020-04-12 DIAGNOSIS — Z79899 Other long term (current) drug therapy: Secondary | ICD-10-CM | POA: Insufficient documentation

## 2020-04-12 DIAGNOSIS — I10 Essential (primary) hypertension: Secondary | ICD-10-CM | POA: Insufficient documentation

## 2020-04-12 DIAGNOSIS — Z91040 Latex allergy status: Secondary | ICD-10-CM | POA: Diagnosis not present

## 2020-04-12 DIAGNOSIS — E876 Hypokalemia: Secondary | ICD-10-CM

## 2020-04-12 DIAGNOSIS — Z9221 Personal history of antineoplastic chemotherapy: Secondary | ICD-10-CM | POA: Insufficient documentation

## 2020-04-12 DIAGNOSIS — T783XXA Angioneurotic edema, initial encounter: Secondary | ICD-10-CM | POA: Diagnosis not present

## 2020-04-12 DIAGNOSIS — T464X5A Adverse effect of angiotensin-converting-enzyme inhibitors, initial encounter: Secondary | ICD-10-CM | POA: Diagnosis not present

## 2020-04-12 DIAGNOSIS — E119 Type 2 diabetes mellitus without complications: Secondary | ICD-10-CM | POA: Diagnosis not present

## 2020-04-12 DIAGNOSIS — R220 Localized swelling, mass and lump, head: Secondary | ICD-10-CM | POA: Diagnosis present

## 2020-04-12 LAB — CBC WITH DIFFERENTIAL/PLATELET
Abs Immature Granulocytes: 0.03 10*3/uL (ref 0.00–0.07)
Basophils Absolute: 0 10*3/uL (ref 0.0–0.1)
Basophils Relative: 0 %
Eosinophils Absolute: 0.1 10*3/uL (ref 0.0–0.5)
Eosinophils Relative: 1 %
HCT: 42 % (ref 36.0–46.0)
Hemoglobin: 13.6 g/dL (ref 12.0–15.0)
Immature Granulocytes: 0 %
Lymphocytes Relative: 35 %
Lymphs Abs: 3.7 10*3/uL (ref 0.7–4.0)
MCH: 29.6 pg (ref 26.0–34.0)
MCHC: 32.4 g/dL (ref 30.0–36.0)
MCV: 91.5 fL (ref 80.0–100.0)
Monocytes Absolute: 0.5 10*3/uL (ref 0.1–1.0)
Monocytes Relative: 4 %
Neutro Abs: 6.2 10*3/uL (ref 1.7–7.7)
Neutrophils Relative %: 60 %
Platelets: 263 10*3/uL (ref 150–400)
RBC: 4.59 MIL/uL (ref 3.87–5.11)
RDW: 13.2 % (ref 11.5–15.5)
WBC: 10.4 10*3/uL (ref 4.0–10.5)
nRBC: 0 % (ref 0.0–0.2)

## 2020-04-12 LAB — BASIC METABOLIC PANEL
Anion gap: 13 (ref 5–15)
BUN: 10 mg/dL (ref 6–20)
CO2: 28 mmol/L (ref 22–32)
Calcium: 8.9 mg/dL (ref 8.9–10.3)
Chloride: 100 mmol/L (ref 98–111)
Creatinine, Ser: 0.87 mg/dL (ref 0.44–1.00)
GFR calc Af Amer: >60 mL/min (ref >60)
GFR calc non Af Amer: >60 mL/min (ref >60)
Glucose, Bld: 135 mg/dL — ABNORMAL HIGH (ref 70–99)
Potassium: 3 mmol/L — ABNORMAL LOW (ref 3.5–5.1)
Sodium: 141 mmol/L (ref 135–145)

## 2020-04-12 MED ORDER — FAMOTIDINE IN NACL 20-0.9 MG/50ML-% IV SOLN
20.0000 mg | Freq: Once | INTRAVENOUS | Status: AC
  Administered 2020-04-12: 20 mg via INTRAVENOUS
  Filled 2020-04-12: qty 50

## 2020-04-12 MED ORDER — ONDANSETRON 4 MG PO TBDP
8.0000 mg | ORAL_TABLET | Freq: Once | ORAL | Status: AC
  Administered 2020-04-12: 8 mg via ORAL
  Filled 2020-04-12: qty 2

## 2020-04-12 MED ORDER — EPINEPHRINE 0.3 MG/0.3ML IJ SOAJ
0.3000 mg | Freq: Once | INTRAMUSCULAR | Status: AC
  Administered 2020-04-12: 0.3 mg via INTRAMUSCULAR
  Filled 2020-04-12: qty 0.3

## 2020-04-12 MED ORDER — POTASSIUM CHLORIDE CRYS ER 20 MEQ PO TBCR
40.0000 meq | EXTENDED_RELEASE_TABLET | Freq: Once | ORAL | Status: AC
  Administered 2020-04-12: 40 meq via ORAL
  Filled 2020-04-12: qty 2

## 2020-04-12 MED ORDER — METHYLPREDNISOLONE SODIUM SUCC 125 MG IJ SOLR
125.0000 mg | Freq: Once | INTRAMUSCULAR | Status: AC
  Administered 2020-04-12: 125 mg via INTRAVENOUS
  Filled 2020-04-12: qty 2

## 2020-04-12 MED ORDER — PREDNISONE 20 MG PO TABS
20.0000 mg | ORAL_TABLET | Freq: Two times a day (BID) | ORAL | 0 refills | Status: DC
Start: 2020-04-12 — End: 2020-07-19

## 2020-04-12 MED ORDER — DIPHENHYDRAMINE HCL 50 MG/ML IJ SOLN
25.0000 mg | Freq: Once | INTRAMUSCULAR | Status: AC
  Administered 2020-04-12: 25 mg via INTRAVENOUS
  Filled 2020-04-12: qty 1

## 2020-04-12 NOTE — ED Provider Notes (Signed)
[facility] EMERGENCY DEPARTMENT Provider Note   CSN: [id number] Arrival date & time: 04/12/20  0747     History Chief Complaint  Patient presents with  . Angioedema    [name] is a 59 y.o. female.  HPI She presents for evaluation of swelling in the mouth and throat.  She notices about 5 AM, at which time she took 3 Benadryl tablets.  No history of same.  History of lisinopril use for hypertension.  Family members have had angioedema from ACE inhibitors in the past.  She denies fever, chills, cough, shortness of breath, weakness or dizziness.  She presents for evaluation, by private vehicle.  There are no other known modifying factors.    Past Medical History:  Diagnosis Date  . Breast cancer ([place]) 2017   right breast  . Breast cancer of lower-inner quadrant of right female breast ([place]) 08/08/2015  . Colon polyps   . DDD (degenerative disc disease), cervical   . Degenerative joint disease   . Diabetes mellitus without complication ([place])   . Diverticulosis   . Gallstones   . GERD (gastroesophageal reflux disease)   . Headache(784.0)    migraine like per patient  . Hypertension   . Low back pain    dr phillips, pain management  . Osteoarthritis   . Personal history of chemotherapy 2017  . Personal history of radiation therapy 2017    Patient Active Problem List   Diagnosis Date Noted  . DDD (degenerative disc disease), lumbar 07/16/2018    Class: Chronic  . Other spondylosis with radiculopathy, lumbar region 07/16/2018    Class: Chronic  . S/P lumbar fusion 07/13/2018  . Tenosynovitis, de Quervain 08/10/2017  . 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 Insomnia 07/16/2016  . Left groin pain 11/30/2015  . Morbid obesity with BMI of 40.0-44.9, adult ([place]) 10/26/2015  . Malignant neoplasm of lower-inner quadrant of right breast of female, estrogen receptor positive ([place]) 08/08/2015  . S/P revision of total knee 07/24/2015  . Acute postoperative  pain 07/11/2015  . Loose total knee arthroplasty ([place]) 07/02/2015  . Type 2 diabetes mellitus without complications ([place]) 19/41/7408  . Adhesive capsulitis of left shoulder 10/11/2014  . Paraspinous mass, left 03/31/2013  . Other symptoms and signs involving the nervous system 03/31/2013  . Internal hemorrhoid 02/17/2013  . Abdominal pain, other specified site 02/17/2013  . Neoplasm of soft tissue, left shoulder and left upper arm 08/03/2012  . Lightheadedness 01/29/2012  . Cholelithiasis with cholecystitis 10/08/2011  . Hypokalemia 09/17/2011  . Gastro-esophageal reflux disease without esophagitis 08/08/2010  . Gout 12/18/2008  . Essential (primary) hypertension 03/15/2007  . OA (osteoarthritis) of knee 03/15/2007  . HEADACHE 03/15/2007    Past Surgical History:  Procedure Laterality Date  . CHOLECYSTECTOMY  10/28/2011   Procedure: LAPAROSCOPIC CHOLECYSTECTOMY WITH INTRAOPERATIVE CHOLANGIOGRAM;  Surgeon: Earnstine Regal, MD;  Location: [facility];  Service: General;  Laterality: N/A;  . COLONOSCOPY WITH ESOPHAGOGASTRODUODENOSCOPY (EGD)  04/13/2018   per Dr. Henrene Pastor, adenomatous polyp, repeat in 5 yrs   . EXAM UNDER ANESTHESIA WITH MANIPULATION OF KNEE Right 05/30/2003  . EXAM UNDER ANESTHESIA WITH MANIPULATION OF KNEE Left 12/27/2002  . GANGLION CYST EXCISION Right    right wrist  . KNEE ARTHROSCOPY Bilateral 01/02/2000  . KNEE ARTHROSCOPY Right   . LAPAROSCOPIC VAGINAL HYSTERECTOMY WITH SALPINGO OOPHORECTOMY Bilateral 07/13/2006  . LIPOMA EXCISION Right 02/21/2008   hip  . MASS EXCISION  08/24/2012   Procedure: EXCISION MASS;  Surgeon: Sherren Mocha  Leeanne Mannan, MD;  Location: [place];  Service: General;  Laterality: Left;  excisie soft tissue masses left shoulder(back) & left upper arm  . MASS EXCISION Left 01/24/2014   Procedure: EXCISION SOFT TISSUE MASSES LOWER LEFT BACK;  Surgeon: Earnstine Regal, MD;  Location: [place];  Service: General;  Laterality: Left;  Marland Kitchen  MASS EXCISION Right 04/30/2016   Procedure: EXCISION OF SKIN RIGHT CHEST WALL;  Surgeon: Autumn Messing III, MD;  Location: [place];  Service: General;  Laterality: Right;  EXCISION OF SKIN RIGHT CHEST WALL  . MASTECTOMY Right 2017  . MASTECTOMY W/ SENTINEL NODE BIOPSY Right 10/01/2015   Procedure: RIGHT MASTECTOMY WITH SENTINEL LYMPH NODE BIOPSY;  Surgeon: Autumn Messing III, MD;  Location: [place];  Service: General;  Laterality: Right;  . PORT-A-CATH REMOVAL Left 04/30/2016   Procedure: REMOVAL PORT-A-CATH;  Surgeon: Autumn Messing III, MD;  Location: [place];  Service: General;  Laterality: Left;  REMOVAL PORT-A-CATH  . PORTACATH PLACEMENT Left 11/01/2015   Procedure: INSERTION PORT-A-CATH;  Surgeon: Autumn Messing III, MD;  Location: [place];  Service: General;  Laterality: Left;  . REPLACEMENT UNICONDYLAR JOINT KNEE Left 04/20/2001  . REVISION TOTAL KNEE ARTHROPLASTY Right 06/18/2004  . REVISION TOTAL KNEE ARTHROPLASTY Left 11/15/2002  . REVISION TOTAL KNEE ARTHROPLASTY Left 10/12/2001  . REVISION TOTAL KNEE ARTHROPLASTY Right 07/09/2015  . TONSILLECTOMY    . TOTAL KNEE ARTHROPLASTY Right 04/25/2003  . TOTAL KNEE ARTHROPLASTY Left 06/25/2009     OB History   No obstetric history on file.     Family History  Problem Relation Age of Onset  . Stomach cancer Mother   . Heart disease Father   . Prostate cancer Father   . Kidney cancer Sister        one out of the four  . Hypertension Sister   . Hypertension Brother   . Crohn's disease Other        nephew  . Colon cancer Maternal Grandmother   . Esophageal cancer Neg Hx     Social History   Tobacco Use  . Smoking status: Current Every Day Smoker    Packs/day: 0.25    Years: 20.00    Pack years: 5.00    Types: Cigarettes  . Smokeless tobacco: Former Systems developer    Quit date: 09/30/2015  . Tobacco comment: smokes about 4 cigs/day; working on quitting - 08/22/16 gwd  Vaping Use  . Vaping Use: Never used    Substance Use Topics  . Alcohol use: No    Alcohol/week: 0.0 standard drinks  . Drug use: No    Home Medications Prior to Admission medications   Medication Sig Start Date End Date Taking? Authorizing Provider  amitriptyline (ELAVIL) 100 MG tablet Take 150 mg by mouth at bedtime.  05/24/19  Yes [provider]  amLODipine (NORVASC) 10 MG tablet Take 1 tablet (10 mg total) by mouth daily. 09/09/19  Yes Laurey Morale, MD  lidocaine (LIDODERM) 5 % Place 1 patch onto the skin daily. Remove & Discard patch within 12 hours or as directed by MD 02/25/20  Yes Kenton Kingfisher, Abigail, PA-C  metFORMIN (GLUCOPHAGE) 500 MG tablet TAKE 1 TABLET BY MOUTH TWICE A DAY WITH A MEAL Patient taking differently: Take 500 mg by mouth daily with breakfast.  04/10/20  Yes Laurey Morale, MD  methadone (DOLOPHINE) 10 MG tablet Take 10 mg by mouth every 8 (eight) hours.  05/24/19  Yes [provider]  metoprolol tartrate (LOPRESSOR) 50 MG tablet TAKE 1 TABLET BY MOUTH TWICE A DAY Patient taking differently: Take 50 mg by mouth daily.  03/20/20  Yes Laurey Morale, MD  omeprazole (PRILOSEC) 40 MG capsule TAKE 1 CAPSULE BY MOUTH EVERY DAY Patient taking differently: Take 40 mg by mouth daily.  12/12/19  Yes Laurey Morale, MD  Oxycodone HCl 10 MG TABS Take 10 mg by mouth every 6 (six) hours as needed (for pain).  07/22/19  Yes [provider]  potassium chloride SA (KLOR-CON) 20 MEQ tablet Take 1 tablet (20 mEq total) by mouth daily. 02/25/20  Yes Margarita Mail, PA-C  tamoxifen (NOLVADEX) 20 MG tablet Take 1 tablet (20 mg total) by mouth daily. 10/27/19  Yes [name], Virgie Dad, MD  zolpidem (AMBIEN) 10 MG tablet TAKE 2 TABLETS (20 MG TOTAL) BY MOUTH AT BEDTIME. INS ONLY PAYS ONE DAILY Patient taking differently: Take 10 mg by mouth at bedtime.  03/20/20  Yes Laurey Morale, MD  lisinopril (ZESTRIL) 10 MG tablet TAKE 1 TABLET BY MOUTH EVERY DAY Patient taking differently: Take 10 mg by mouth daily.  02/02/20  04/12/20 Yes Laurey Morale, MD  celecoxib (CELEBREX) 200 MG capsule Take 1 capsule (200 mg total) by mouth 2 (two) times daily. Patient not taking: Reported on 04/12/2020 02/25/20   Margarita Mail, PA-C  Lancets [facility] DELICA PLUS [id number]) MISC USE TO TEST ONCE DAILY DX CODE E11.9 02/27/20   Laurey Morale, MD  ondansetron (ZOFRAN ODT) 4 MG disintegrating tablet Take 1 tablet (4 mg total) by mouth every 8 (eight) hours as needed for nausea or vomiting. Patient not taking: Reported on 04/12/2020 03/21/20   Laurey Morale, MD  [facility] ULTRA test strip TEST ONCE DAILY 08/24/19   Laurey Morale, MD  potassium chloride SA (KLOR-CON M20) 20 MEQ tablet Take 1 tablet (20 mEq total) by mouth daily. Patient not taking: Reported on 04/12/2020 09/09/19   Laurey Morale, MD  predniSONE (DELTASONE) 20 MG tablet Take 1 tablet (20 mg total) by mouth 2 (two) times daily. 04/12/20   Daleen Bo, MD  venlafaxine XR (EFFEXOR-XR) 37.5 MG 24 hr capsule Take 1 capsule (37.5 mg total) by mouth daily with breakfast. Patient not taking: Reported on 04/12/2020 10/27/19   [name], Virgie Dad, MD    Allergies    Lisinopril, Codeine, Latex, Sulfonamide derivatives, Gabapentin, and Lyrica [pregabalin]  Review of Systems   Review of Systems  All other systems reviewed and are negative.   Physical Exam Updated Vital Signs BP (!) 155/93   Pulse 90   Temp 98.4 F (36.9 C) (Oral)   Resp 19   Ht 5\' 7"  (1.702 m)   Wt 113.9 kg   SpO2 94%   BMI 39.31 kg/m   Physical Exam Vitals and nursing note reviewed.  Constitutional:      General: She is not in acute distress.    Appearance: She is well-developed. She is not ill-appearing, toxic-appearing or diaphoretic.  HENT:     Head: Normocephalic and atraumatic.     Right Ear: External ear normal.     Left Ear: External ear normal.     Nose: No congestion or rhinorrhea.     Mouth/Throat:     Comments: Garbled voice consistent with upper airway edema.  Tongue swollen right  side with right-sided angioedema.  Sublingual fullness is present bilaterally.  No stridor Eyes:     Conjunctiva/sclera: Conjunctivae normal.  Pupils: Pupils are equal, round, and reactive to light.  Neck:     Trachea: Phonation normal.  Cardiovascular:     Rate and Rhythm: Normal rate and regular rhythm.     Heart sounds: Normal heart sounds.  Pulmonary:     Effort: Pulmonary effort is normal. No respiratory distress.     Breath sounds: Normal breath sounds. No stridor. No rhonchi.  Abdominal:     General: There is no distension.     Palpations: Abdomen is soft.  Musculoskeletal:        General: Normal range of motion.     Cervical back: Normal range of motion and neck supple.  Skin:    General: Skin is warm and dry.  Neurological:     Mental Status: She is alert and oriented to person, place, and time.     Cranial Nerves: No cranial nerve deficit.     Sensory: No sensory deficit.     Motor: No abnormal muscle tone.     Coordination: Coordination normal.  Psychiatric:        Mood and Affect: Mood normal.        Behavior: Behavior normal.        Thought Content: Thought content normal.        Judgment: Judgment normal.     ED Results / Procedures / Treatments   Labs (all labs ordered are listed, but only abnormal results are displayed) Labs Reviewed  BASIC METABOLIC PANEL - Abnormal; Notable for the following components:      Result Value   Potassium 3.0 (*)    Glucose, Bld 135 (*)    All other components within normal limits  CBC WITH DIFFERENTIAL/PLATELET    EKG None  Radiology DG Chest Port 1 View  Result Date: 04/12/2020 CLINICAL DATA:  Possible allergic reaction. Neck swelling and difficulty breathing. EXAM: PORTABLE CHEST 1 VIEW COMPARISON:  04/19/2019 FINDINGS: The heart is borderline enlarged given the AP projection and portable technique. The lungs are grossly clear. Minimal streaky basilar atelectasis. No pleural effusions or pulmonary edema. The bony  thorax is intact. IMPRESSION: Borderline cardiac enlargement. Streaky basilar atelectasis but no edema or effusions. Electronically Signed   By: Marijo Sanes M.D.   On: 04/12/2020 09:04    Procedures .Critical Care Performed by: Daleen Bo, MD Authorized by: Daleen Bo, MD   Critical care provider statement:    Critical care time (minutes):  55   Critical care start time:  04/12/2020 8:15 AM   Critical care end time:  04/12/2020 1:29 PM   Critical care time was exclusive of:  Separately billable procedures and treating other patients   Critical care was necessary to treat or prevent imminent or life-threatening deterioration of the following conditions:  Respiratory failure   Critical care was time spent personally by me on the following activities:  Blood draw for specimens, development of treatment plan with patient or surrogate, discussions with consultants, evaluation of patient's response to treatment, examination of patient, obtaining history from patient or surrogate, ordering and performing treatments and interventions, ordering and review of laboratory studies, pulse oximetry, re-evaluation of patient's condition, review of old charts and ordering and review of radiographic studies   (including critical care time)  Medications Ordered in ED Medications  potassium chloride SA (KLOR-CON) CR tablet 40 mEq (has no administration in time range)  methylPREDNISolone sodium succinate (SOLU-MEDROL) 125 mg/2 mL injection 125 mg (125 mg Intravenous Given 04/12/20 0843)  famotidine (PEPCID) IVPB 20 mg premix (  0 mg Intravenous Stopped 04/12/20 1037)  diphenhydrAMINE (BENADRYL) injection 25 mg (25 mg Intravenous Given 04/12/20 0839)  EPINEPHrine (EPI-PEN) injection 0.3 mg (0.3 mg Intramuscular Given 04/12/20 0838)  ondansetron (ZOFRAN-ODT) disintegrating tablet 8 mg (8 mg Oral Given 04/12/20 1227)    ED Course  I have reviewed the triage vital signs and the nursing notes.  Pertinent labs  & imaging results that were available during my care of the patient were reviewed by me and considered in my medical decision making (see chart for details).  Clinical Course as of Apr 12 1337  Thu Apr 12, 2020  0828 Meds ordered to treat allergic reaction, nursing is aware of the acuity of the situation.   [EW]  1005 She feels like her tongue is less swollen.  The voice has improved.  The tongue does appear less swollen but she still has fullness beneath her tongue.  No respiratory distress at this time.  We will continue to observe.   [EW]    Clinical Course User Index [EW] Daleen Bo, MD   MDM Rules/Calculators/A&P                           Patient Vitals for the past 24 hrs:  BP Temp Temp src Pulse Resp SpO2 Height Weight  04/12/20 1300 (!) 155/93 -- -- 90 19 94 % -- --  04/12/20 1030 (!) 150/79 -- -- 80 16 96 % -- --  04/12/20 0948 (!) 160/87 -- -- 89 18 98 % -- --  04/12/20 0900 135/84 -- -- 89 18 94 % -- --  04/12/20 0851 (!) 146/97 -- -- 89 19 97 % -- --  04/12/20 0747 (!) 151/101 98.4 F (36.9 C) Oral 91 18 97 % 5\' 7"  (1.702 m) 113.9 kg    1:23 PM Reevaluation with update and discussion. After initial assessment and treatment, an updated evaluation reveals she is more comfortable now and states she has been able ambulate to the bathroom without shortness of breath or dizziness.  She has not yet taken her daily amlodipine and metoprolol.  Findings discussed with the patient.  She understands to stop taking lisinopril.  All questions answered. Daleen Bo   Medical Decision Making:  This patient is presenting for evaluation of swelling of tongue and throat, which does require a range of treatment options, and is a complaint that involves a high risk of morbidity and mortality. The differential diagnoses include infection, ACE inhibitor angioedema, nonspecific allergy I decided to review old records, and in summary middle-aged female, on ACE inhibitor presenting with  angioedema.  I did not require additional historical information from anyone.  Clinical Laboratory Tests Ordered, included CBC and Metabolic panel. Review indicates normal except potassium low.   Cardiac Monitor Tracing which shows normal sinus rhythm    Critical Interventions-clinical evaluation, medication treatment, laboratory testing, observation and reassessment.  After These Interventions, the Patient was reevaluated and was found improved with decreased oral and sublingual angioedema.  Suspect ACE inhibitor related.  Incidental hypokalemia, patient currently on potassium.  She is not currently taking any medications that tend to leach potassium.  Doubt metabolic instability.  Patient given a dose of potassium prior to discharge.  She is instructed to maintain her usual potassium dosing.  CRITICAL CARE-yes Performed by: Daleen Bo  Nursing Notes Reviewed/ Care Coordinated Applicable Imaging Reviewed Interpretation of Laboratory Data incorporated into ED treatment  The patient appears reasonably screened and/or stabilized for discharge and  I doubt any other medical condition or other [facility] requiring further screening, evaluation, or treatment in the ED at this time prior to discharge.  Plan: Home Medications-stay on diphenhydramine and famotidine, usual dosing for 5 days, stop lisinopril continue others; Home Treatments-gradual advance diet and activity; return here if the recommended treatment, does not improve the symptoms; Recommended follow up-PCP follow-up 1 week and as needed     Final Clinical Impression(s) / ED Diagnoses Final diagnoses:  Angiotensin converting enzyme inhibitor-aggravated angioedema, initial encounter  Hypokalemia    Rx / DC Orders ED Discharge Orders         Ordered    predniSONE (DELTASONE) 20 MG tablet  2 times daily     Discontinue  Reprint     04/12/20 1324           Daleen Bo, MD 04/12/20 1339

## 2020-04-12 NOTE — Discharge Instructions (Addendum)
Do not take lisinopril anymore.  This type of medicine is called an ACE inhibitor.  Avoid these as well.  See your doctor next week for a blood pressure check and further care as needed.  You will need to continue treating the reaction using prednisone that was sent to your drugstore.  Start taking it this evening.  Also take diphenhydramine 25 mg 4 times a day, and famotidine 20 mg twice a day, both for 5 days.  Your potassium level was low, make sure that you continue taking your potassium pills, and try to eat foods which contain plenty of potassium.  Have your doctor recheck your potassium level, next week.  Return here if needed for problems such as trouble breathing, swelling, weakness or dizziness.

## 2020-04-12 NOTE — ED Triage Notes (Addendum)
Pt arrives via [facility] from home, woke up with sob and tongue swelling. Pt reports difficulty swallowing and has obvious difficulty speaking due to swelling. Airway intact. Took 3 benadryl. No hives present. Pt does take lisinopril with family hx of angioedema related to lisinopril. EMS VSS. Lung sounds clear. BP 129/77, HR 88, 96% on ra.

## 2020-04-23 ENCOUNTER — Ambulatory Visit: Payer: 59 | Admitting: Family Medicine

## 2020-05-10 ENCOUNTER — Telehealth: Payer: Self-pay | Admitting: *Deleted

## 2020-05-10 ENCOUNTER — Other Ambulatory Visit: Payer: Self-pay | Admitting: Family Medicine

## 2020-05-10 DIAGNOSIS — E119 Type 2 diabetes mellitus without complications: Secondary | ICD-10-CM

## 2020-05-10 NOTE — Telephone Encounter (Signed)
This RN returned call to [facility] supply per VM requesting verification that Dr Jannifer Rodney is attending physician per pt's request for an " arm sleeve ".  Return call number given as [phone] reference # [id number].  Per call- RN placed a hold line with no answer x 10 minutes - call discontinued by RN due to other clinic needs.

## 2020-05-25 ENCOUNTER — Other Ambulatory Visit: Payer: Self-pay | Admitting: Family Medicine

## 2020-07-13 ENCOUNTER — Emergency Department (HOSPITAL_COMMUNITY): Payer: 59

## 2020-07-13 ENCOUNTER — Emergency Department (HOSPITAL_COMMUNITY)
Admission: EM | Admit: 2020-07-13 | Discharge: 2020-07-13 | Disposition: A | Discharge status: Home / Self Care | Payer: 59 | Attending: Emergency Medicine | Admitting: Emergency Medicine

## 2020-07-13 ENCOUNTER — Other Ambulatory Visit: Payer: Self-pay

## 2020-07-13 ENCOUNTER — Encounter (HOSPITAL_COMMUNITY): Payer: Self-pay | Admitting: *Deleted

## 2020-07-13 DIAGNOSIS — M25561 Pain in right knee: Secondary | ICD-10-CM | POA: Insufficient documentation

## 2020-07-13 DIAGNOSIS — M25562 Pain in left knee: Secondary | ICD-10-CM | POA: Diagnosis not present

## 2020-07-13 DIAGNOSIS — Z91040 Latex allergy status: Secondary | ICD-10-CM | POA: Diagnosis not present

## 2020-07-13 DIAGNOSIS — T161XXA Foreign body in right ear, initial encounter: Secondary | ICD-10-CM | POA: Diagnosis not present

## 2020-07-13 DIAGNOSIS — Z853 Personal history of malignant neoplasm of breast: Secondary | ICD-10-CM | POA: Diagnosis not present

## 2020-07-13 DIAGNOSIS — Z7984 Long term (current) use of oral hypoglycemic drugs: Secondary | ICD-10-CM | POA: Diagnosis not present

## 2020-07-13 DIAGNOSIS — Z96653 Presence of artificial knee joint, bilateral: Secondary | ICD-10-CM | POA: Diagnosis not present

## 2020-07-13 DIAGNOSIS — S301XXA Contusion of abdominal wall, initial encounter: Secondary | ICD-10-CM | POA: Insufficient documentation

## 2020-07-13 DIAGNOSIS — D72829 Elevated white blood cell count, unspecified: Secondary | ICD-10-CM | POA: Insufficient documentation

## 2020-07-13 DIAGNOSIS — M25571 Pain in right ankle and joints of right foot: Secondary | ICD-10-CM | POA: Diagnosis not present

## 2020-07-13 DIAGNOSIS — S99921A Unspecified injury of right foot, initial encounter: Secondary | ICD-10-CM | POA: Diagnosis present

## 2020-07-13 DIAGNOSIS — E119 Type 2 diabetes mellitus without complications: Secondary | ICD-10-CM | POA: Diagnosis not present

## 2020-07-13 DIAGNOSIS — E876 Hypokalemia: Secondary | ICD-10-CM | POA: Insufficient documentation

## 2020-07-13 DIAGNOSIS — L539 Erythematous condition, unspecified: Secondary | ICD-10-CM | POA: Diagnosis not present

## 2020-07-13 DIAGNOSIS — S82831A Other fracture of upper and lower end of right fibula, initial encounter for closed fracture: Secondary | ICD-10-CM | POA: Diagnosis not present

## 2020-07-13 DIAGNOSIS — Z79899 Other long term (current) drug therapy: Secondary | ICD-10-CM | POA: Diagnosis not present

## 2020-07-13 DIAGNOSIS — I10 Essential (primary) hypertension: Secondary | ICD-10-CM | POA: Diagnosis not present

## 2020-07-13 DIAGNOSIS — Y92410 Unspecified street and highway as the place of occurrence of the external cause: Secondary | ICD-10-CM | POA: Insufficient documentation

## 2020-07-13 DIAGNOSIS — F17210 Nicotine dependence, cigarettes, uncomplicated: Secondary | ICD-10-CM | POA: Diagnosis not present

## 2020-07-13 LAB — COMPREHENSIVE METABOLIC PANEL
ALT: 28 U/L (ref 0–44)
AST: 44 U/L — ABNORMAL HIGH (ref 15–41)
Albumin: 4.3 g/dL (ref 3.5–5.0)
Alkaline Phosphatase: 68 U/L (ref 38–126)
Anion gap: 14 (ref 5–15)
BUN: 14 mg/dL (ref 6–20)
CO2: 28 mmol/L (ref 22–32)
Calcium: 9.2 mg/dL (ref 8.9–10.3)
Chloride: 100 mmol/L (ref 98–111)
Creatinine, Ser: 0.83 mg/dL (ref 0.44–1.00)
GFR, Estimated: >60 mL/min (ref >60)
Glucose, Bld: 105 mg/dL — ABNORMAL HIGH (ref 70–99)
Potassium: 3 mmol/L — ABNORMAL LOW (ref 3.5–5.1)
Sodium: 142 mmol/L (ref 135–145)
Total Bilirubin: 0.5 mg/dL (ref 0.3–1.2)
Total Protein: 7.4 g/dL (ref 6.5–8.1)

## 2020-07-13 LAB — CBC
HCT: 39.6 % (ref 36.0–46.0)
Hemoglobin: 13.3 g/dL (ref 12.0–15.0)
MCH: 30.4 pg (ref 26.0–34.0)
MCHC: 33.6 g/dL (ref 30.0–36.0)
MCV: 90.6 fL (ref 80.0–100.0)
Platelets: 277 10*3/uL (ref 150–400)
RBC: 4.37 MIL/uL (ref 3.87–5.11)
RDW: 13.2 % (ref 11.5–15.5)
WBC: 11.4 10*3/uL — ABNORMAL HIGH (ref 4.0–10.5)
nRBC: 0 % (ref 0.0–0.2)

## 2020-07-13 LAB — I-STAT CHEM 8, ED
BUN: 14 mg/dL (ref 6–20)
Calcium, Ion: 1.07 mmol/L — ABNORMAL LOW (ref 1.15–1.40)
Chloride: 100 mmol/L (ref 98–111)
Creatinine, Ser: 0.8 mg/dL (ref 0.44–1.00)
Glucose, Bld: 107 mg/dL — ABNORMAL HIGH (ref 70–99)
HCT: 40 % (ref 36.0–46.0)
Hemoglobin: 13.6 g/dL (ref 12.0–15.0)
Potassium: 2.8 mmol/L — ABNORMAL LOW (ref 3.5–5.1)
Sodium: 141 mmol/L (ref 135–145)
TCO2: 31 mmol/L (ref 22–32)

## 2020-07-13 LAB — PROTIME-INR
INR: 1 (ref 0.8–1.2)
Prothrombin Time: 12.7 seconds (ref 11.4–15.2)

## 2020-07-13 LAB — ETHANOL: Alcohol, Ethyl (B): <10 mg/dL (ref <10)

## 2020-07-13 MED ORDER — POTASSIUM CHLORIDE CRYS ER 20 MEQ PO TBCR
40.0000 meq | EXTENDED_RELEASE_TABLET | Freq: Once | ORAL | Status: AC
  Administered 2020-07-13: 40 meq via ORAL
  Filled 2020-07-13: qty 2

## 2020-07-13 MED ORDER — SODIUM CHLORIDE 0.9 % IV BOLUS
500.0000 mL | Freq: Once | INTRAVENOUS | Status: AC
  Administered 2020-07-13: 500 mL via INTRAVENOUS

## 2020-07-13 MED ORDER — IOHEXOL 300 MG/ML  SOLN
100.0000 mL | Freq: Once | INTRAMUSCULAR | Status: AC | PRN
  Administered 2020-07-13: 100 mL via INTRAVENOUS

## 2020-07-13 MED ORDER — ONDANSETRON HCL 4 MG/2ML IJ SOLN
4.0000 mg | Freq: Once | INTRAMUSCULAR | Status: AC
  Administered 2020-07-13: 4 mg via INTRAVENOUS
  Filled 2020-07-13: qty 2

## 2020-07-13 MED ORDER — HYDROMORPHONE HCL 1 MG/ML IJ SOLN
1.0000 mg | Freq: Once | INTRAMUSCULAR | Status: AC
  Administered 2020-07-13: 1 mg via INTRAVENOUS
  Filled 2020-07-13: qty 1

## 2020-07-13 MED ORDER — SODIUM CHLORIDE (PF) 0.9 % IJ SOLN
INTRAMUSCULAR | Status: AC
  Filled 2020-07-13: qty 50
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
## 2020-07-13 NOTE — Discharge Instructions (Addendum)
Follow up with Orthopedics  You were given pain medication while in the ED  Return for new or worsening symptoms

## 2020-07-16 ENCOUNTER — Ambulatory Visit: Payer: 59 | Admitting: Internal Medicine

## 2020-07-18 ENCOUNTER — Telehealth: Payer: Self-pay | Admitting: Physician Assistant

## 2020-07-18 NOTE — Telephone Encounter (Signed)
Phone rang with no answer Per review, she is already scheduled for a virtual visit - Isaac Laud PA is not in the office tomorrow, only virtual clinic AM/PM

## 2020-07-18 NOTE — Telephone Encounter (Signed)
Patient called because she wants to change her in office visit to a virtual appt for tomorrow.

## 2020-07-19 ENCOUNTER — Telehealth: Payer: Self-pay

## 2020-07-19 ENCOUNTER — Telehealth (INDEPENDENT_AMBULATORY_CARE_PROVIDER_SITE_OTHER): Payer: 59 | Admitting: Physician Assistant

## 2020-07-19 ENCOUNTER — Encounter: Payer: Self-pay | Admitting: Physician Assistant

## 2020-07-19 VITALS — BP 148/86 | HR 77 | Ht 67.0 in | Wt 260.0 lb

## 2020-07-19 DIAGNOSIS — R55 Syncope and collapse: Secondary | ICD-10-CM | POA: Diagnosis not present

## 2020-07-19 DIAGNOSIS — I10 Essential (primary) hypertension: Secondary | ICD-10-CM | POA: Diagnosis not present

## 2020-07-19 DIAGNOSIS — E119 Type 2 diabetes mellitus without complications: Secondary | ICD-10-CM

## 2020-07-19 DIAGNOSIS — Z170 Estrogen receptor positive status [ER+]: Secondary | ICD-10-CM

## 2020-07-19 DIAGNOSIS — C50311 Malignant neoplasm of lower-inner quadrant of right female breast: Secondary | ICD-10-CM

## 2020-07-19 NOTE — Telephone Encounter (Signed)
Called patient and left a detailed voice message for the AVS instructions gave [name]'s recommendations and asked to give our office a call if she has any questions. AVS summary mailed to patient.

## 2020-07-19 NOTE — Telephone Encounter (Signed)
  Patient Consent for Virtual Visit         [name] has provided verbal consent on 07/19/2020 for a virtual visit (video or telephone).   CONSENT FOR VIRTUAL VISIT FOR:  [name]  By participating in this virtual visit I agree to the following:  I hereby voluntarily request, consent and authorize [facility] and its employed or contracted physicians, physician assistants, nurse practitioners or other licensed health care professionals (the Practitioner), to provide me with telemedicine health care services (the "Services") as deemed necessary by the treating Practitioner. I acknowledge and consent to receive the Services by the Practitioner via telemedicine. I understand that the telemedicine visit will involve communicating with the Practitioner through live audiovisual communication technology and the disclosure of certain medical information by electronic transmission. I acknowledge that I have been given the opportunity to request an in-person assessment or other available alternative prior to the telemedicine visit and am voluntarily participating in the telemedicine visit.  I understand that I have the right to withhold or withdraw my consent to the use of telemedicine in the course of my care at any time, without affecting my right to future care or treatment, and that the Practitioner or I may terminate the telemedicine visit at any time. I understand that I have the right to inspect all information obtained and/or recorded in the course of the telemedicine visit and may receive copies of available information for a reasonable fee.  I understand that some of the potential risks of receiving the Services via telemedicine include:  Marland Kitchen Delay or interruption in medical evaluation due to technological equipment failure or disruption; . Information transmitted may not be sufficient (e.g. poor resolution of images) to allow for appropriate medical decision making by the  Practitioner; and/or  . In rare instances, security protocols could fail, causing a breach of personal health information.  Furthermore, I acknowledge that it is my responsibility to provide information about my medical history, conditions and care that is complete and accurate to the best of my ability. I acknowledge that Practitioner's advice, recommendations, and/or decision may be based on factors not within their control, such as incomplete or inaccurate data provided by me or distortions of diagnostic images or specimens that may result from electronic transmissions. I understand that the practice of medicine is not an exact science and that Practitioner makes no warranties or guarantees regarding treatment outcomes. I acknowledge that a copy of this consent can be made available to me via my patient portal ([place]), or I can request a printed copy by calling the office of [facility].    I understand that my insurance will be billed for this visit.   I have read or had this consent read to me. . I understand the contents of this consent, which adequately explains the benefits and risks of the Services being provided via telemedicine.  . I have been provided ample opportunity to ask questions regarding this consent and the Services and have had my questions answered to my satisfaction. . I give my informed consent for the services to be provided through the use of telemedicine in my medical care

## 2020-07-19 NOTE — Patient Instructions (Addendum)
Medication Instructions:   TAKE Metoprolol Tartrate (Lopressor) 50 mg 2 times a day as prescribed  *If you need a refill on your cardiac medications before your next appointment, please call your pharmacy*  Lab Work: No Labs If you have labs (blood work) drawn today and your tests are completely normal, you will receive your results only by: Marland Kitchen MyChart Message (if you have MyChart) OR . A paper copy in the mail If you have any lab test that is abnormal or we need to change your treatment, we will call you to review the results.  Testing/Procedures: No Testing  Follow-Up: At [facility], you and your health needs are our priority.  As part of our continuing mission to provide you with exceptional heart care, we have created designated Provider Care Teams.  These Care Teams include your primary Cardiologist (physician) and Advanced Practice Providers (APPs -  Physician Assistants and Nurse Practitioners) who all work together to provide you with the care you need, when you need it.  Your next appointment:   1 year(s)  The format for your next appointment:   In Person  Provider:   Cherlynn Kaiser, MD
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
## 2020-07-20 NOTE — Telephone Encounter (Signed)
The patient completed a virtual visit with Almyra Deforest 07/19/2020.

## 2020-07-24 ENCOUNTER — Other Ambulatory Visit: Payer: Self-pay

## 2020-07-24 ENCOUNTER — Ambulatory Visit: Payer: 59 | Admitting: Family Medicine

## 2020-07-24 ENCOUNTER — Encounter: Payer: Self-pay | Admitting: Family Medicine

## 2020-07-24 VITALS — BP 132/72 | HR 86 | Temp 98.7°F | Ht 67.01 in | Wt 266.0 lb

## 2020-07-24 DIAGNOSIS — S82831D Other fracture of upper and lower end of right fibula, subsequent encounter for closed fracture with routine healing: Secondary | ICD-10-CM
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
## 2020-08-15 ENCOUNTER — Other Ambulatory Visit: Payer: Self-pay | Admitting: Family Medicine

## 2020-09-10 ENCOUNTER — Encounter: Payer: 59 | Admitting: Family Medicine

## 2020-09-11 ENCOUNTER — Encounter: Payer: Self-pay | Admitting: Family Medicine

## 2020-09-11 ENCOUNTER — Ambulatory Visit (INDEPENDENT_AMBULATORY_CARE_PROVIDER_SITE_OTHER): Payer: 59 | Admitting: Family Medicine

## 2020-09-11 ENCOUNTER — Other Ambulatory Visit: Payer: Self-pay

## 2020-09-11 VITALS — BP 128/80 | HR 77 | Temp 98.4°F | Ht 67.0 in | Wt 258.2 lb

## 2020-09-11 DIAGNOSIS — E119 Type 2 diabetes mellitus without complications: Secondary | ICD-10-CM

## 2020-09-11 DIAGNOSIS — Z0000 Encounter for general adult medical examination without abnormal findings: Secondary | ICD-10-CM | POA: Diagnosis not present

## 2020-09-11 LAB — POCT GLYCOSYLATED HEMOGLOBIN (HGB A1C): Hemoglobin A1C: 6.6 % — AB (ref 4.0–5.6)

## 2020-09-11 MED ORDER — ZOLPIDEM TARTRATE 10 MG PO TABS
ORAL_TABLET | ORAL | 5 refills | Status: DC
Start: 2020-09-11 — End: 2021-04-01

## 2020-09-11 MED ORDER — AMLODIPINE BESYLATE 10 MG PO TABS
10.0000 mg | ORAL_TABLET | Freq: Every day | ORAL | 3 refills | Status: DC
Start: 2020-09-11 — End: 2020-11-12

## 2020-09-11 MED ORDER — POTASSIUM CHLORIDE CRYS ER 20 MEQ PO TBCR
20.0000 meq | EXTENDED_RELEASE_TABLET | Freq: Every day | ORAL | 3 refills | Status: DC
Start: 2020-09-11 — End: 2021-09-17

## 2020-09-11 NOTE — Progress Notes (Signed)
Subjective:    Patient ID: [name], female    DOB: 09-Jan-1961, 58 y.o.   MRN: [id number]  HPI Here for a well exam. She is doing well in general. She has been seeing Dr. Debby Bud at [facility] for a fractured right fibula. She gets to stop wearing the walking boot tomorrow. She does complain of some loose stools and cramps ever since she had a cholecystectomy a few years ago. Her A1c today is 6.6    Review of Systems  Constitutional: Negative.   HENT: Negative.   Eyes: Negative.   Respiratory: Negative.   Cardiovascular: Negative.   Gastrointestinal: Positive for diarrhea.  Genitourinary: Negative for decreased urine volume, difficulty urinating, dyspareunia, dysuria, enuresis, flank pain, frequency, hematuria, pelvic pain and urgency.  Musculoskeletal: Negative.   Skin: Negative.   Neurological: Negative.   Psychiatric/Behavioral: Negative.        Objective:   Physical Exam Constitutional:      General: She is not in acute distress.    Appearance: She is well-developed and well-nourished. She is obese.  HENT:     Head: Normocephalic and atraumatic.     Right Ear: External ear normal.     Left Ear: External ear normal.     Nose: Nose normal.     Mouth/Throat:     Mouth: Oropharynx is clear and moist.     Pharynx: No oropharyngeal exudate.  Eyes:     General: No scleral icterus.    Extraocular Movements: EOM normal.     Conjunctiva/sclera: Conjunctivae normal.     Pupils: Pupils are equal, round, and reactive to light.  Neck:     Thyroid: No thyromegaly.     Vascular: No JVD.  Cardiovascular:     Rate and Rhythm: Normal rate and regular rhythm.     Pulses: Intact distal pulses.     Heart sounds: Normal heart sounds. No murmur heard. No friction rub. No gallop.   Pulmonary:     Effort: Pulmonary effort is normal. No respiratory distress.     Breath sounds: Normal breath sounds. No wheezing or rales.  Chest:     Chest wall: No tenderness.  Abdominal:      General: Bowel sounds are normal. There is no distension.     Palpations: Abdomen is soft. There is no mass.     Tenderness: There is no abdominal tenderness. There is no guarding or rebound.  Musculoskeletal:        General: No tenderness or edema. Normal range of motion.     Cervical back: Normal range of motion and neck supple.  Lymphadenopathy:     Cervical: No cervical adenopathy.  Skin:    General: Skin is warm and dry.     Findings: No erythema or rash.  Neurological:     Mental Status: She is alert and oriented to person, place, and time.     Cranial Nerves: No cranial nerve deficit.     Motor: No abnormal muscle tone.     Coordination: Coordination normal.     Deep Tendon Reflexes: Reflexes are normal and symmetric. Reflexes normal.  Psychiatric:        Mood and Affect: Mood and affect normal.        Behavior: Behavior normal.        Thought Content: Thought content normal.        Judgment: Judgment normal.           Assessment & Plan:  Well exam. We  discussed diet and exercise. Get fasting labs. For the loose stools, I suggested she see her GI, Dr. Henrene Pastor, for this. Alysia Penna, MD  Alysia Penna, MD

## 2020-09-18 ENCOUNTER — Other Ambulatory Visit: Payer: 59

## 2020-10-09 NOTE — Addendum Note (Signed)
Addended by: Marrion Coy on: 10/09/2020 12:48 PM   Modules accepted: Orders

## 2020-10-11 ENCOUNTER — Other Ambulatory Visit: Payer: 59

## 2020-10-18 ENCOUNTER — Other Ambulatory Visit: Payer: Self-pay

## 2020-10-19 ENCOUNTER — Ambulatory Visit: Payer: 59 | Admitting: Family Medicine

## 2020-10-19 ENCOUNTER — Encounter: Payer: Self-pay | Admitting: Family Medicine

## 2020-10-19 VITALS — BP 140/80 | HR 112 | Temp 98.1°F | Resp 20 | Wt 258.2 lb

## 2020-10-19 DIAGNOSIS — Z0000 Encounter for general adult medical examination without abnormal findings: Secondary | ICD-10-CM | POA: Diagnosis not present

## 2020-10-19 DIAGNOSIS — E119 Type 2 diabetes mellitus without complications: Secondary | ICD-10-CM | POA: Diagnosis not present

## 2020-10-19 DIAGNOSIS — G629 Polyneuropathy, unspecified: Secondary | ICD-10-CM

## 2020-10-19 DIAGNOSIS — I10 Essential (primary) hypertension: Secondary | ICD-10-CM

## 2020-10-19 DIAGNOSIS — E876 Hypokalemia: Secondary | ICD-10-CM

## 2020-10-19 NOTE — Progress Notes (Signed)
   Subjective:    Patient ID: [name], female    DOB: 05-28-1961, 60 y.o.   MRN: [id number]  HPI Here for 2 weeks of an intermittent "hot" feeling that runs from the left knee down to the left foot. No redness in the leg. No warmth to the touch. No actual pain. She takes 150 mg of Amitriptyline at bedtime per Dr. Hardin Negus for neuropathy.    Review of Systems  Constitutional: Negative.   Respiratory: Negative.   Cardiovascular: Negative.        Objective:   Physical Exam Constitutional:      Appearance: Normal appearance.  Cardiovascular:     Rate and Rhythm: Normal rate and regular rhythm.     Pulses: Normal pulses.     Heart sounds: Normal heart sounds.  Pulmonary:     Effort: Pulmonary effort is normal.     Breath sounds: Normal breath sounds.  Musculoskeletal:     Comments: The left lower leg appears unremarkable. She has her usual moderate edema. No erythema or warmth. No tenderness or cords.   Neurological:     Mental Status: She is alert.           Assessment & Plan:  Neuropathy. I advised her to increase the Amitriptyline to 200 mg each night. She will see Dr. Hardin Negus again next month.  Alysia Penna, MD

## 2020-10-20 LAB — CBC WITH DIFFERENTIAL/PLATELET
Absolute Monocytes: 439 cells/uL (ref 200–950)
Basophils Absolute: 17 cells/uL (ref 0–200)
Basophils Relative: 0.2 %
Eosinophils Absolute: 95 cells/uL (ref 15–500)
Eosinophils Relative: 1.1 %
HCT: 38.5 % (ref 35.0–45.0)
Hemoglobin: 13.3 g/dL (ref 11.7–15.5)
Lymphs Abs: 3509 cells/uL (ref 850–3900)
MCH: 30.4 pg (ref 27.0–33.0)
MCHC: 34.5 g/dL (ref 32.0–36.0)
MCV: 87.9 fL (ref 80.0–100.0)
MPV: 9.8 fL (ref 7.5–12.5)
Monocytes Relative: 5.1 %
Neutro Abs: 4541 cells/uL (ref 1500–7800)
Neutrophils Relative %: 52.8 %
Platelets: 267 10*3/uL (ref 140–400)
RBC: 4.38 10*6/uL (ref 3.80–5.10)
RDW: 13.6 % (ref 11.0–15.0)
Total Lymphocyte: 40.8 %
WBC: 8.6 10*3/uL (ref 3.8–10.8)

## 2020-10-20 LAB — BASIC METABOLIC PANEL
BUN: 13 mg/dL (ref 7–25)
CO2: 31 mmol/L (ref 20–32)
Calcium: 9.5 mg/dL (ref 8.6–10.4)
Chloride: 100 mmol/L (ref 98–110)
Creat: 0.84 mg/dL (ref 0.50–0.99)
Glucose, Bld: 80 mg/dL (ref 65–99)
Potassium: 3.5 mmol/L (ref 3.5–5.3)
Sodium: 141 mmol/L (ref 135–146)

## 2020-10-20 LAB — HEPATIC FUNCTION PANEL
AG Ratio: 1.9 (calc) (ref 1.0–2.5)
ALT: 13 U/L (ref 6–29)
AST: 20 U/L (ref 10–35)
Albumin: 4.4 g/dL (ref 3.6–5.1)
Alkaline phosphatase (APISO): 75 U/L (ref 37–153)
Bilirubin, Direct: 0.1 mg/dL (ref 0.0–0.2)
Globulin: 2.3 g/dL (calc) (ref 1.9–3.7)
Indirect Bilirubin: 0.2 mg/dL (calc) (ref 0.2–1.2)
Total Bilirubin: 0.3 mg/dL (ref 0.2–1.2)
Total Protein: 6.7 g/dL (ref 6.1–8.1)

## 2020-10-20 LAB — LIPID PANEL
Cholesterol: 194 mg/dL (ref <200)
HDL: 52 mg/dL (ref >=50)
LDL Cholesterol (Calc): 101 mg/dL (calc) — ABNORMAL HIGH
Non-HDL Cholesterol (Calc): 142 mg/dL (calc) — ABNORMAL HIGH (ref <130)
Total CHOL/HDL Ratio: 3.7 (calc) (ref <5.0)
Triglycerides: 284 mg/dL — ABNORMAL HIGH (ref <150)

## 2020-10-20 LAB — TSH: TSH: 2.96 mIU/L (ref 0.40–4.50)

## 2020-11-02 ENCOUNTER — Other Ambulatory Visit: Payer: Self-pay

## 2020-11-02 ENCOUNTER — Telehealth (INDEPENDENT_AMBULATORY_CARE_PROVIDER_SITE_OTHER): Payer: 59 | Admitting: Adult Health

## 2020-11-02 ENCOUNTER — Encounter: Payer: Self-pay | Admitting: Adult Health

## 2020-11-02 VITALS — BP 140/64 | Wt 258.0 lb

## 2020-11-02 DIAGNOSIS — J0140 Acute pansinusitis, unspecified: Secondary | ICD-10-CM

## 2020-11-02 DIAGNOSIS — R110 Nausea: Secondary | ICD-10-CM

## 2020-11-02 MED ORDER — ONDANSETRON HCL 4 MG PO TABS: 4.0000 mg | ORAL_TABLET | Freq: Three times a day (TID) | ORAL | 0 refills | Status: DC | PRN

## 2020-11-02 MED ORDER — DOXYCYCLINE HYCLATE 100 MG PO CAPS: 100.0000 mg | ORAL_CAPSULE | Freq: Two times a day (BID) | ORAL | 0 refills | Status: DC

## 2020-11-02 NOTE — Progress Notes (Signed)
Virtual Visit via Telephone Note  I connected with Wynona Luna on 11/02/20 at 11:00 AM EST by telephone and verified that I am speaking with the correct person using two identifiers.   I discussed the limitations, risks, security and privacy concerns of performing an evaluation and management service by telephone and the availability of in person appointments. I also discussed with the patient that there may be a patient responsible charge related to this service. The patient expressed understanding and agreed to proceed.  Location patient: home Location provider: work or home office Participants present for the call: patient, provider Patient did not have a visit in the prior 7 days to address this/these issue(s).   History of Present Illness: 60 year old female who is being evaluated today for an acute issue.  Her symptoms started roughly 5 to 7 days ago and have been becoming progressively worse.  Symptoms include sinus pain and pressure, nasal congestion, postnasal drip,nause without vomiting,  sore throat, dry cough, and the feeling of ears being stopped up.  She denies fevers or chills.  She was tested for COVID-19 and this was negative.  She has been using Coricidin which seems to help minimally.   Observations/Objective: Patient sounds cheerful and well on the phone. I do not appreciate any SOB. Speech and thought processing are grossly intact. Patient reported vitals:  Assessment and Plan: 1. Acute non-recurrent pansinusitis -We will treat due to progressively worsening symptoms with doxycycline.  She is allergic to sulfa antibiotics.  Advise rest, hydration, follow-up with PCP if no improvement in the next 4 to 5 days - doxycycline (VIBRAMYCIN) 100 MG capsule; Take 1 capsule (100 mg total) by mouth 2 (two) times daily.  Dispense: 14 capsule; Refill: 0  2. Nausea - from PND - ondansetron (ZOFRAN) 4 MG tablet; Take 1 tablet (4 mg total) by mouth every 8 (eight) hours as  needed for nausea or vomiting.  Dispense: 10 tablet; Refill: 0   Follow Up Instructions:   I did not refer this patient for an OV in the next 24 hours for this/these issue(s).  I discussed the assessment and treatment plan with the patient. The patient was provided an opportunity to ask questions and all were answered. The patient agreed with the plan and demonstrated an understanding of the instructions.   The patient was advised to call back or seek an in-person evaluation if the symptoms worsen or if the condition fails to improve as anticipated.  I provided 13 minutes of non-face-to-face time during this encounter.   Dorothyann Peng, NP

## 2020-11-05 ENCOUNTER — Other Ambulatory Visit: Payer: Self-pay | Admitting: Oncology

## 2020-11-05 DIAGNOSIS — Z1231 Encounter for screening mammogram for malignant neoplasm of breast: Secondary | ICD-10-CM

## 2020-11-09 ENCOUNTER — Other Ambulatory Visit: Payer: Self-pay | Admitting: Oncology

## 2020-11-11 ENCOUNTER — Other Ambulatory Visit: Payer: Self-pay | Admitting: Family Medicine

## 2020-11-12 ENCOUNTER — Other Ambulatory Visit: Payer: Self-pay | Admitting: Family Medicine
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
## 2020-11-22 ENCOUNTER — Inpatient Hospital Stay: Payer: 59

## 2020-11-22 ENCOUNTER — Inpatient Hospital Stay: Payer: 59 | Attending: Oncology | Admitting: Oncology

## 2020-11-22 ENCOUNTER — Other Ambulatory Visit: Payer: Self-pay

## 2020-11-22 VITALS — BP 165/84 | HR 75 | Temp 97.7°F | Resp 18 | Ht 67.0 in | Wt 265.1 lb

## 2020-11-22 DIAGNOSIS — F17210 Nicotine dependence, cigarettes, uncomplicated: Secondary | ICD-10-CM | POA: Diagnosis not present

## 2020-11-22 DIAGNOSIS — Z170 Estrogen receptor positive status [ER+]: Secondary | ICD-10-CM

## 2020-11-22 DIAGNOSIS — Z9011 Acquired absence of right breast and nipple: Secondary | ICD-10-CM | POA: Insufficient documentation

## 2020-11-22 DIAGNOSIS — Z923 Personal history of irradiation: Secondary | ICD-10-CM | POA: Diagnosis not present

## 2020-11-22 DIAGNOSIS — E119 Type 2 diabetes mellitus without complications: Secondary | ICD-10-CM | POA: Diagnosis not present

## 2020-11-22 DIAGNOSIS — C50311 Malignant neoplasm of lower-inner quadrant of right female breast: Secondary | ICD-10-CM | POA: Diagnosis not present

## 2020-11-22 DIAGNOSIS — Z79811 Long term (current) use of aromatase inhibitors: Secondary | ICD-10-CM | POA: Insufficient documentation

## 2020-11-22 LAB — COMPREHENSIVE METABOLIC PANEL
ALT: 16 U/L (ref 0–44)
AST: 24 U/L (ref 15–41)
Albumin: 3.7 g/dL (ref 3.5–5.0)
Alkaline Phosphatase: 78 U/L (ref 38–126)
Anion gap: 8 (ref 5–15)
BUN: 10 mg/dL (ref 8–23)
CO2: 30 mmol/L (ref 22–32)
Calcium: 8.9 mg/dL (ref 8.9–10.3)
Chloride: 101 mmol/L (ref 98–111)
Creatinine, Ser: 1 mg/dL (ref 0.44–1.00)
GFR, Estimated: >60 mL/min (ref >60)
Glucose, Bld: 119 mg/dL — ABNORMAL HIGH (ref 70–99)
Potassium: 3.1 mmol/L — ABNORMAL LOW (ref 3.5–5.1)
Sodium: 139 mmol/L (ref 135–145)
Total Bilirubin: <0.2 mg/dL — ABNORMAL LOW (ref 0.3–1.2)
Total Protein: 6.9 g/dL (ref 6.5–8.1)

## 2020-11-22 LAB — CBC WITH DIFFERENTIAL/PLATELET
Abs Immature Granulocytes: 0.02 10*3/uL (ref 0.00–0.07)
Basophils Absolute: 0 10*3/uL (ref 0.0–0.1)
Basophils Relative: 0 %
Eosinophils Absolute: 0.2 10*3/uL (ref 0.0–0.5)
Eosinophils Relative: 3 %
HCT: 36.8 % (ref 36.0–46.0)
Hemoglobin: 12 g/dL (ref 12.0–15.0)
Immature Granulocytes: 0 %
Lymphocytes Relative: 36 %
Lymphs Abs: 2.4 10*3/uL (ref 0.7–4.0)
MCH: 29.6 pg (ref 26.0–34.0)
MCHC: 32.6 g/dL (ref 30.0–36.0)
MCV: 90.6 fL (ref 80.0–100.0)
Monocytes Absolute: 0.5 10*3/uL (ref 0.1–1.0)
Monocytes Relative: 7 %
Neutro Abs: 3.7 10*3/uL (ref 1.7–7.7)
Neutrophils Relative %: 54 %
Platelets: 257 10*3/uL (ref 150–400)
RBC: 4.06 MIL/uL (ref 3.87–5.11)
RDW: 14.3 % (ref 11.5–15.5)
WBC: 6.8 10*3/uL (ref 4.0–10.5)
nRBC: 0 % (ref 0.0–0.2)

## 2020-11-22 MED ORDER — TAMOXIFEN CITRATE 20 MG PO TABS: 20.0000 mg | ORAL_TABLET | Freq: Every day | ORAL | 4 refills | Status: DC

## 2020-11-26 ENCOUNTER — Encounter: Payer: Self-pay | Admitting: Physical Therapy

## 2020-11-26 ENCOUNTER — Ambulatory Visit: Payer: 59 | Attending: Oncology | Admitting: Physical Therapy

## 2020-11-26 ENCOUNTER — Other Ambulatory Visit: Payer: Self-pay

## 2020-11-26 DIAGNOSIS — M25611 Stiffness of right shoulder, not elsewhere classified: Secondary | ICD-10-CM | POA: Diagnosis not present

## 2020-11-26 DIAGNOSIS — L599 Disorder of the skin and subcutaneous tissue related to radiation, unspecified: Secondary | ICD-10-CM | POA: Diagnosis present

## 2020-11-26 DIAGNOSIS — I890 Lymphedema, not elsewhere classified: Secondary | ICD-10-CM | POA: Diagnosis present

## 2020-11-26 DIAGNOSIS — C50311 Malignant neoplasm of lower-inner quadrant of right female breast: Secondary | ICD-10-CM | POA: Diagnosis present

## 2020-11-26 DIAGNOSIS — M25511 Pain in right shoulder: Secondary | ICD-10-CM | POA: Diagnosis present
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
## 2020-11-28 ENCOUNTER — Other Ambulatory Visit: Payer: 59

## 2020-11-28 ENCOUNTER — Encounter: Payer: 59 | Admitting: Genetic Counselor

## 2020-11-29 ENCOUNTER — Other Ambulatory Visit: Payer: Self-pay | Admitting: Licensed Clinical Social Worker

## 2020-11-29 DIAGNOSIS — C50311 Malignant neoplasm of lower-inner quadrant of right female breast: Secondary | ICD-10-CM

## 2020-11-29 DIAGNOSIS — Z170 Estrogen receptor positive status [ER+]: Secondary | ICD-10-CM

## 2020-12-03 ENCOUNTER — Inpatient Hospital Stay: Payer: 59

## 2020-12-03 ENCOUNTER — Inpatient Hospital Stay: Payer: 59 | Admitting: Licensed Clinical Social Worker

## 2020-12-13 ENCOUNTER — Encounter: Payer: 59 | Admitting: Physical Therapy

## 2020-12-17 ENCOUNTER — Ambulatory Visit: Payer: 59 | Admitting: Physical Therapy

## 2020-12-17 ENCOUNTER — Other Ambulatory Visit: Payer: Self-pay | Admitting: Family Medicine

## 2020-12-20 ENCOUNTER — Ambulatory Visit: Payer: 59 | Admitting: Physical Therapy

## 2020-12-24 ENCOUNTER — Ambulatory Visit: Payer: 59

## 2020-12-24 ENCOUNTER — Encounter: Payer: 59 | Admitting: Physical Therapy

## 2020-12-27 ENCOUNTER — Encounter: Payer: 59 | Admitting: Physical Therapy

## 2020-12-31 ENCOUNTER — Encounter: Payer: 59 | Admitting: Physical Therapy

## 2021-01-03 ENCOUNTER — Encounter: Payer: 59 | Admitting: Physical Therapy

## 2021-01-08 ENCOUNTER — Other Ambulatory Visit: Payer: Self-pay | Admitting: Family Medicine

## 2021-01-28 ENCOUNTER — Other Ambulatory Visit: Payer: Self-pay | Admitting: Family Medicine

## 2021-01-30 ENCOUNTER — Ambulatory Visit: Payer: 59 | Admitting: Physical Therapy

## 2021-02-07 ENCOUNTER — Other Ambulatory Visit: Payer: Self-pay

## 2021-02-07 ENCOUNTER — Ambulatory Visit
Admission: RE | Admit: 2021-02-07 | Discharge: 2021-02-07 | Disposition: A | Discharge status: Home / Self Care | Payer: 59 | Source: Ambulatory Visit | Attending: Oncology | Admitting: Oncology

## 2021-02-07 DIAGNOSIS — Z1231 Encounter for screening mammogram for malignant neoplasm of breast: Secondary | ICD-10-CM

## 2021-02-11 ENCOUNTER — Other Ambulatory Visit: Payer: Self-pay

## 2021-02-11 ENCOUNTER — Ambulatory Visit: Payer: 59 | Attending: Oncology | Admitting: Physical Therapy

## 2021-02-11 ENCOUNTER — Encounter: Payer: Self-pay | Admitting: Physical Therapy

## 2021-02-11 DIAGNOSIS — M25511 Pain in right shoulder: Secondary | ICD-10-CM | POA: Diagnosis present

## 2021-02-11 DIAGNOSIS — L599 Disorder of the skin and subcutaneous tissue related to radiation, unspecified: Secondary | ICD-10-CM | POA: Diagnosis present

## 2021-02-11 DIAGNOSIS — C50311 Malignant neoplasm of lower-inner quadrant of right female breast: Secondary | ICD-10-CM | POA: Diagnosis present

## 2021-02-11 DIAGNOSIS — I890 Lymphedema, not elsewhere classified: Secondary | ICD-10-CM | POA: Insufficient documentation

## 2021-02-11 DIAGNOSIS — M25611 Stiffness of right shoulder, not elsewhere classified: Secondary | ICD-10-CM | POA: Insufficient documentation
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
## 2021-02-16 ENCOUNTER — Other Ambulatory Visit: Payer: Self-pay | Admitting: Family Medicine

## 2021-02-22 ENCOUNTER — Telehealth: Payer: Self-pay | Admitting: Family Medicine

## 2021-02-22 MED ORDER — METFORMIN HCL 500 MG PO TABS: ORAL_TABLET | ORAL | 3 refills | Status: DC

## 2021-02-22 NOTE — Telephone Encounter (Signed)
Pt is calling in stating that she is out of Rx metformin (GLUCOPHAGE) 500 MG   Pharm:  [facility] on [address]

## 2021-02-22 NOTE — Telephone Encounter (Signed)
Rx sent in

## 2021-02-25 ENCOUNTER — Encounter: Payer: Self-pay | Admitting: Physical Therapy

## 2021-02-25 ENCOUNTER — Ambulatory Visit: Payer: 59 | Attending: Oncology | Admitting: Physical Therapy

## 2021-02-25 ENCOUNTER — Other Ambulatory Visit: Payer: Self-pay

## 2021-02-25 DIAGNOSIS — M25511 Pain in right shoulder: Secondary | ICD-10-CM | POA: Diagnosis present

## 2021-02-25 DIAGNOSIS — I890 Lymphedema, not elsewhere classified: Secondary | ICD-10-CM | POA: Insufficient documentation

## 2021-02-25 DIAGNOSIS — M25611 Stiffness of right shoulder, not elsewhere classified: Secondary | ICD-10-CM | POA: Diagnosis not present

## 2021-02-25 DIAGNOSIS — L599 Disorder of the skin and subcutaneous tissue related to radiation, unspecified: Secondary | ICD-10-CM | POA: Diagnosis present

## 2021-02-25 DIAGNOSIS — C50311 Malignant neoplasm of lower-inner quadrant of right female breast: Secondary | ICD-10-CM | POA: Diagnosis present
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
## 2021-02-27 ENCOUNTER — Ambulatory Visit: Payer: 59 | Admitting: Physical Therapy

## 2021-02-27 ENCOUNTER — Encounter: Payer: Self-pay | Admitting: Physical Therapy

## 2021-02-27 ENCOUNTER — Other Ambulatory Visit: Payer: Self-pay

## 2021-02-27 DIAGNOSIS — M25511 Pain in right shoulder: Secondary | ICD-10-CM

## 2021-02-27 DIAGNOSIS — M25611 Stiffness of right shoulder, not elsewhere classified: Secondary | ICD-10-CM | POA: Diagnosis not present
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
## 2021-03-04 ENCOUNTER — Encounter: Payer: 59 | Admitting: Physical Therapy

## 2021-03-06 ENCOUNTER — Encounter: Payer: 59 | Admitting: Physical Therapy

## 2021-03-11 ENCOUNTER — Other Ambulatory Visit: Payer: Self-pay

## 2021-03-11 ENCOUNTER — Ambulatory Visit: Payer: 59

## 2021-03-11 DIAGNOSIS — I890 Lymphedema, not elsewhere classified: Secondary | ICD-10-CM

## 2021-03-11 DIAGNOSIS — M25611 Stiffness of right shoulder, not elsewhere classified: Secondary | ICD-10-CM | POA: Diagnosis not present

## 2021-03-11 DIAGNOSIS — M25511 Pain in right shoulder: Secondary | ICD-10-CM

## 2021-03-11 DIAGNOSIS — L599 Disorder of the skin and subcutaneous tissue related to radiation, unspecified: Secondary | ICD-10-CM

## 2021-03-11 DIAGNOSIS — C50311 Malignant neoplasm of lower-inner quadrant of right female breast: Secondary | ICD-10-CM
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
## 2021-03-18 ENCOUNTER — Ambulatory Visit: Payer: 59 | Admitting: Physical Therapy

## 2021-03-18 ENCOUNTER — Other Ambulatory Visit: Payer: Self-pay

## 2021-03-18 ENCOUNTER — Encounter: Payer: Self-pay | Admitting: Physical Therapy

## 2021-03-18 DIAGNOSIS — L599 Disorder of the skin and subcutaneous tissue related to radiation, unspecified: Secondary | ICD-10-CM

## 2021-03-18 DIAGNOSIS — M25511 Pain in right shoulder: Secondary | ICD-10-CM

## 2021-03-18 DIAGNOSIS — M25611 Stiffness of right shoulder, not elsewhere classified: Secondary | ICD-10-CM | POA: Diagnosis not present

## 2021-03-18 DIAGNOSIS — I890 Lymphedema, not elsewhere classified: Secondary | ICD-10-CM

## 2021-03-18 DIAGNOSIS — C50311 Malignant neoplasm of lower-inner quadrant of right female breast: Secondary | ICD-10-CM
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
## 2021-03-20 ENCOUNTER — Other Ambulatory Visit: Payer: Self-pay | Admitting: Family Medicine

## 2021-03-20 ENCOUNTER — Encounter: Payer: 59 | Admitting: Physical Therapy

## 2021-04-01 ENCOUNTER — Other Ambulatory Visit: Payer: Self-pay | Admitting: Family Medicine

## 2021-04-02 ENCOUNTER — Telehealth (INDEPENDENT_AMBULATORY_CARE_PROVIDER_SITE_OTHER): Payer: 59 | Admitting: Family Medicine

## 2021-04-02 ENCOUNTER — Encounter: Payer: Self-pay | Admitting: Family Medicine

## 2021-04-02 DIAGNOSIS — J019 Acute sinusitis, unspecified: Secondary | ICD-10-CM

## 2021-04-02 MED ORDER — ONDANSETRON HCL 8 MG PO TABS: 8.0000 mg | ORAL_TABLET | Freq: Four times a day (QID) | ORAL | 0 refills | Status: DC | PRN

## 2021-04-02 MED ORDER — AZITHROMYCIN 250 MG PO TABS: ORAL_TABLET | ORAL | 0 refills | Status: DC

## 2021-04-02 NOTE — Progress Notes (Signed)
   Subjective:    Patient ID: [name], female    DOB: 12-26-1960, 60 y.o.   MRN: [id number]  HPI Virtual Visit via Telephone Note  I connected with the patient on 04/02/21 at  2:30 PM EDT by telephone and verified that I am speaking with the correct person using two identifiers.   I discussed the limitations, risks, security and privacy concerns of performing an evaluation and management service by telephone and the availability of in person appointments. I also discussed with the patient that there may be a patient responsible charge related to this service. The patient expressed understanding and agreed to proceed.  Location patient: home Location provider: work or home office Participants present for the call: patient, provider Patient did not have a visit in the prior 7 days to address this/these issue(s).   History of Present Illness: Her for 5 days of headache, sinus pressure, PND, nausea with vomiting, and diarrhea. No fever or ST or body aches. No cough or SOB. She has tested negative for the Covid-19 virus twice in the past 3 days. Drinking fluids and taking Tylenol sinus and Imodium.    Observations/Objective: Patient sounds cheerful and well on the phone. I do not appreciate any SOB. Speech and thought processing are grossly intact. Patient reported vitals:  Assessment and Plan: Sinusitis, treat with a Zpack. Add Zofran as needed for nausea.  Alysia Penna, MD   Follow Up Instructions:     [id number] 5-10 [phone] 11-20 9443 21-30 I did not refer this patient for an OV in the next 24 hours for this/these issue(s).  I discussed the assessment and treatment plan with the patient. The patient was provided an opportunity to ask questions and all were answered. The patient agreed with the plan and demonstrated an understanding of the instructions.   The patient was advised to call back or seek an in-person evaluation if the symptoms worsen or if the condition fails to  improve as anticipated.  I provided 16 minutes of non-face-to-face time during this encounter.   Alysia Penna, MD     Review of Systems     Objective:   Physical Exam        Assessment & Plan:

## 2021-04-08 ENCOUNTER — Encounter: Payer: Self-pay | Admitting: Physical Therapy

## 2021-04-11 ENCOUNTER — Other Ambulatory Visit: Payer: Self-pay | Admitting: Family Medicine

## 2021-05-14 ENCOUNTER — Other Ambulatory Visit: Payer: Self-pay | Admitting: Family Medicine

## 2021-06-07 ENCOUNTER — Other Ambulatory Visit: Payer: Self-pay | Admitting: General Surgery

## 2021-06-10 ENCOUNTER — Other Ambulatory Visit: Payer: Self-pay | Admitting: Family Medicine

## 2021-06-15 ENCOUNTER — Other Ambulatory Visit: Payer: Self-pay

## 2021-06-15 ENCOUNTER — Ambulatory Visit
Admission: EM | Admit: 2021-06-15 | Discharge: 2021-06-15 | Disposition: A | Discharge status: Home / Self Care | Payer: 59 | Attending: Internal Medicine | Admitting: Internal Medicine

## 2021-06-15 ENCOUNTER — Encounter: Payer: Self-pay | Admitting: Emergency Medicine

## 2021-06-15 DIAGNOSIS — L509 Urticaria, unspecified: Secondary | ICD-10-CM

## 2021-06-15 DIAGNOSIS — R21 Rash and other nonspecific skin eruption: Secondary | ICD-10-CM

## 2021-06-15 MED ORDER — TRIAMCINOLONE ACETONIDE 0.1 % EX CREA: 1.0000 "application " | TOPICAL_CREAM | Freq: Two times a day (BID) | CUTANEOUS | 0 refills | Status: DC

## 2021-06-15 NOTE — ED Provider Notes (Signed)
[facility]    CSN: [id number] Arrival date & time: 06/15/21  1416      History   Chief Complaint Chief Complaint  Patient presents with   Rash    HPI [name] is a 60 y.o. female.   Patient presents with rash to back that has been present for approximately 6 days.  Patient states that she had lipomas removed from back approximately 1 week ago.  Rash started 2 days after surgery.  Patient called surgeon and was advised to use hydrocortisone cream and Benadryl but this has not shown any improvement in rash.  Rash is itchy and red.   Rash  Past Medical History:  Diagnosis Date   Breast cancer ([place]) 2017   right breast   Breast cancer of lower-inner quadrant of right female breast ([place]) 08/08/2015   Colon polyps    DDD (degenerative disc disease), cervical    Degenerative joint disease    Diabetes mellitus without complication ([place])    Diverticulosis    Gallstones    GERD (gastroesophageal reflux disease)    Headache(784.0)    migraine like per patient   Hypertension    Low back pain    dr Hardin Negus, pain management   Osteoarthritis    Personal history of chemotherapy 2017   Personal history of radiation therapy 2017    Patient Active Problem List   Diagnosis Date Noted   Neuropathy 10/19/2020   DDD (degenerative disc disease), lumbar 07/16/2018    Class: Chronic   Other spondylosis with radiculopathy, lumbar region 07/16/2018    Class: Chronic   S/P lumbar fusion 07/13/2018   Tenosynovitis, de Quervain 08/10/2017   Multiple lipomas 07/15/2017   Insomnia 07/16/2016   Left groin pain 11/30/2015   Morbid obesity with BMI of 40.0-44.9, adult ([place]) 10/26/2015   Malignant neoplasm of lower-inner quadrant of right breast of female, estrogen receptor positive ([place]) 08/08/2015   S/P revision of total knee 07/24/2015   Acute postoperative pain 07/11/2015   Loose total knee arthroplasty ([place]) 07/02/2015   Type 2 diabetes mellitus without  complications ([place]) 86/76/7209   Adhesive capsulitis of left shoulder 10/11/2014   Paraspinous mass, left 03/31/2013   Other symptoms and signs involving the nervous system 03/31/2013   Internal hemorrhoid 02/17/2013   Abdominal pain, other specified site 02/17/2013   Neoplasm of soft tissue, left shoulder and left upper arm 08/03/2012   Lightheadedness 01/29/2012   Cholelithiasis with cholecystitis 10/08/2011   Hypokalemia 09/17/2011   Gastro-esophageal reflux disease without esophagitis 08/08/2010   Gout 12/18/2008   Essential (primary) hypertension 03/15/2007   OA (osteoarthritis) of knee 03/15/2007   HEADACHE 03/15/2007    Past Surgical History:  Procedure Laterality Date   CHOLECYSTECTOMY  10/28/2011   Procedure: LAPAROSCOPIC CHOLECYSTECTOMY WITH INTRAOPERATIVE CHOLANGIOGRAM;  Surgeon: Earnstine Regal, MD;  Location: [facility];  Service: General;  Laterality: N/A;   COLONOSCOPY WITH ESOPHAGOGASTRODUODENOSCOPY (EGD)  04/13/2018   per Dr. Henrene Pastor, adenomatous polyp, repeat in 5 yrs    EXAM UNDER ANESTHESIA WITH MANIPULATION OF KNEE Right 05/30/2003   EXAM UNDER ANESTHESIA WITH MANIPULATION OF KNEE Left 12/27/2002   GANGLION CYST EXCISION Right    right wrist   KNEE ARTHROSCOPY Bilateral 01/02/2000   KNEE ARTHROSCOPY Right    LAPAROSCOPIC VAGINAL HYSTERECTOMY WITH SALPINGO OOPHORECTOMY Bilateral 07/13/2006   LIPOMA EXCISION Right 02/21/2008   hip   MASS EXCISION  08/24/2012   Procedure: EXCISION MASS;  Surgeon: Earnstine Regal, MD;  Location: [facility];  Service: General;  Laterality: Left;  excisie soft tissue masses left shoulder(back) & left upper arm   MASS EXCISION Left 01/24/2014   Procedure: EXCISION SOFT TISSUE MASSES LOWER LEFT BACK;  Surgeon: Earnstine Regal, MD;  Location: [place];  Service: General;  Laterality: Left;   MASS EXCISION Right 04/30/2016   Procedure: EXCISION OF SKIN RIGHT CHEST WALL;  Surgeon: Autumn Messing III, MD;  Location: [place];  Service: General;  Laterality: Right;  EXCISION OF SKIN RIGHT CHEST WALL   MASTECTOMY Right 2017   MASTECTOMY W/ SENTINEL NODE BIOPSY Right 10/01/2015   Procedure: RIGHT MASTECTOMY WITH SENTINEL LYMPH NODE BIOPSY;  Surgeon: Autumn Messing III, MD;  Location: [place];  Service: General;  Laterality: Right;   PORT-A-CATH REMOVAL Left 04/30/2016   Procedure: REMOVAL PORT-A-CATH;  Surgeon: Autumn Messing III, MD;  Location: [place];  Service: General;  Laterality: Left;  REMOVAL PORT-A-CATH   PORTACATH PLACEMENT Left 11/01/2015   Procedure: INSERTION PORT-A-CATH;  Surgeon: Autumn Messing III, MD;  Location: [place];  Service: General;  Laterality: Left;   REPLACEMENT UNICONDYLAR JOINT KNEE Left 04/20/2001   REVISION TOTAL KNEE ARTHROPLASTY Right 06/18/2004   REVISION TOTAL KNEE ARTHROPLASTY Left 11/15/2002   REVISION TOTAL KNEE ARTHROPLASTY Left 10/12/2001   REVISION TOTAL KNEE ARTHROPLASTY Right 07/09/2015   TONSILLECTOMY     TOTAL KNEE ARTHROPLASTY Right 04/25/2003   TOTAL KNEE ARTHROPLASTY Left 06/25/2009    OB History   No obstetric history on file.      Home Medications    Prior to Admission medications   Medication Sig Start Date End Date Taking? Authorizing Provider  amitriptyline (ELAVIL) 100 MG tablet Take 150 mg by mouth at bedtime.  05/24/19  Yes [provider]  amLODipine (NORVASC) 10 MG tablet TAKE 1 TABLET BY MOUTH EVERY DAY 04/11/21  Yes Laurey Morale, MD  metFORMIN (GLUCOPHAGE) 500 MG tablet TAKE 1 TABLET BY MOUTH TWICE A DAY WITH MEALS 06/11/21  Yes Laurey Morale, MD  methadone (DOLOPHINE) 10 MG tablet Take 10 mg by mouth every 8 (eight) hours.  05/24/19  Yes [provider]  methocarbamol (ROBAXIN) 750 MG tablet Take by mouth. 07/20/20  Yes [provider]  metoprolol tartrate (LOPRESSOR) 50 MG tablet TAKE 1 TABLET BY MOUTH TWICE A DAY 05/14/21  Yes Laurey Morale, MD  omeprazole (PRILOSEC) 40 MG capsule TAKE 1  CAPSULE BY MOUTH EVERY DAY 06/11/21  Yes Laurey Morale, MD  ondansetron (ZOFRAN) 8 MG tablet Take 1 tablet (8 mg total) by mouth every 6 (six) hours as needed for nausea or vomiting. 04/02/21  Yes Laurey Morale, MD  OneTouch Delica Lancets 27O MISC USE TO TEST ONCE DAILY DX CODE E11.9 05/11/20  Yes Laurey Morale, MD  [facility] ULTRA test strip TEST ONCE DAILY 03/20/21  Yes Laurey Morale, MD  Oxycodone HCl 10 MG TABS Take 10 mg by mouth every 6 (six) hours as needed (for pain).  07/22/19  Yes [provider]  potassium chloride SA (KLOR-CON M20) 20 MEQ tablet Take 1 tablet (20 mEq total) by mouth daily. 09/11/20  Yes Laurey Morale, MD  tamoxifen (NOLVADEX) 20 MG tablet Take 1 tablet (20 mg total) by mouth daily. 11/22/20  Yes [name], Virgie Dad, MD  triamcinolone cream (KENALOG) 0.1 % Apply 1 application topically 2 (two) times daily. 06/15/21  Yes Odis Luster, FNP  zolpidem (AMBIEN) 10 MG  tablet TAKE 2 TABLETS (20 MG TOTAL) BY MOUTH AT BEDTIME. INS ONLY PAYS ONE DAILY 04/01/21  Yes Laurey Morale, MD  azithromycin (ZITHROMAX Z-PAK) 250 MG tablet As directed 04/02/21   Laurey Morale, MD  lisinopril (ZESTRIL) 10 MG tablet TAKE 1 TABLET BY MOUTH EVERY DAY Patient taking differently: Take 10 mg by mouth daily.  02/02/20 04/12/20  Laurey Morale, MD    Family History Family History  Problem Relation Age of Onset   Stomach cancer Mother    Heart disease Father    Prostate cancer Father    Kidney cancer Sister        one out of the four   Hypertension Sister    Hypertension Brother    Crohn's disease Other        nephew   Colon cancer Maternal Grandmother    Esophageal cancer Neg Hx     Social History Social History   Tobacco Use   Smoking status: Some Days    Packs/day: 0.25    Years: 20.00    Pack years: 5.00    Types: Cigarettes    Last attempt to quit: 05/08/2020    Years since quitting: 1.1   Smokeless tobacco: Former    Quit date: 09/30/2015   Tobacco comments:     smokes about 4 cigs/day; working on quitting - 08/22/16 gwd  Vaping Use   Vaping Use: Never used  Substance Use Topics   Alcohol use: No    Alcohol/week: 0.0 standard drinks   Drug use: No     Allergies   Lisinopril, Codeine, Latex, Sulfonamide derivatives, Gabapentin, and Lyrica [pregabalin]   Review of Systems Review of Systems Per HPI  Physical Exam Triage Vital Signs ED Triage Vitals  Enc Vitals Group     BP 06/15/21 1435 128/84     Pulse Rate 06/15/21 1435 84     Resp 06/15/21 1435 18     Temp 06/15/21 1435 97.9 F (36.6 C)     Temp Source 06/15/21 1435 Oral     SpO2 06/15/21 1435 90 %     Weight 06/15/21 1437 260 lb (117.9 kg)     Height 06/15/21 1437 5\' 7"  (1.702 m)     Head Circumference --      Peak Flow --      Pain Score 06/15/21 1437 6     Pain Loc --      Pain Edu? --      Excl. in [place]? --    No data found.  Updated Vital Signs BP 128/84 (BP Location: Left Arm)   Pulse 84   Temp 97.9 F (36.6 C) (Oral)   Resp 18   Ht 5\' 7"  (1.702 m)   Wt 260 lb (117.9 kg)   SpO2 90%   BMI 40.72 kg/m   Visual Acuity Right Eye Distance:   Left Eye Distance:   Bilateral Distance:    Right Eye Near:   Left Eye Near:    Bilateral Near:     Physical Exam Constitutional:      Appearance: Normal appearance.  HENT:     Head: Normocephalic and atraumatic.  Eyes:     Extraocular Movements: Extraocular movements intact.     Conjunctiva/sclera: Conjunctivae normal.  Pulmonary:     Effort: Pulmonary effort is normal.  Skin:    General: Skin is warm and dry.     Findings: Rash present. Rash is urticarial.     Comments: Diffuse maculopapular rash  present surrounding 2 surgical incisions.  2 surgical incisions that are healing nicely present to lower back.  No signs of infection.  Neurological:     General: No focal deficit present.     Mental Status: She is alert and oriented to person, place, and time. Mental status is at baseline.  Psychiatric:        Mood  and Affect: Mood normal.        Behavior: Behavior normal.        Thought Content: Thought content normal.        Judgment: Judgment normal.     UC Treatments / Results  Labs (all labs ordered are listed, but only abnormal results are displayed) Labs Reviewed - No data to display  EKG   Radiology No results found.  Procedures Procedures (including critical care time)  Medications Ordered in UC Medications - No data to display  Initial Impression / Assessment and Plan / UC Course  I have reviewed the triage vital signs and the nursing notes.  Pertinent labs & imaging results that were available during my care of the patient were reviewed by me and considered in my medical decision making (see chart for details).     Rash appears to be allergic contact dermatitis.  Will treat with triamcinolone cream.  Advised patient to follow-up with surgeon to have an in person appointment for further evaluation and management as this may be a surgical complication.  Patient voiced understanding.  No signs of bacterial infection on exam.  Surgical sites appear to be healing nicely.  Discussed strict return precautions. Patient verbalized understanding and is agreeable with plan.  Final Clinical Impressions(s) / UC Diagnoses   Final diagnoses:  Urticaria  Rash and nonspecific skin eruption     Discharge Instructions      Please call general surgeon on Monday to have an in person appointment for further evaluation and management.     ED Prescriptions     Medication Sig Dispense Auth. Provider   triamcinolone cream (KENALOG) 0.1 % Apply 1 application topically 2 (two) times daily. 30 g Odis Luster, FNP      PDMP not reviewed this encounter.   Odis Luster, FNP 06/15/21 [phone]

## 2021-06-15 NOTE — Discharge Instructions (Addendum)
Please call general surgeon on Monday to have an in person appointment for further evaluation and management.

## 2021-06-15 NOTE — ED Triage Notes (Signed)
Patient states that she had 2 lympomas removed from her back one week ago.  Patient is now having severe itching, possible allergic reaction.  Patient's surgeon suggested Hydrocortisone cream and oral Benadryl every 6 hours which has not helped.

## 2021-07-03 ENCOUNTER — Other Ambulatory Visit: Payer: Self-pay | Admitting: Family Medicine

## 2021-07-05 ENCOUNTER — Ambulatory Visit: Payer: Self-pay

## 2021-07-05 ENCOUNTER — Other Ambulatory Visit: Payer: Self-pay

## 2021-07-05 ENCOUNTER — Ambulatory Visit: Payer: 59 | Admitting: Specialist

## 2021-07-05 ENCOUNTER — Encounter: Payer: Self-pay | Admitting: Specialist

## 2021-07-05 VITALS — BP 151/88 | HR 74 | Ht 67.0 in | Wt 236.0 lb

## 2021-07-05 DIAGNOSIS — R29898 Other symptoms and signs involving the musculoskeletal system: Secondary | ICD-10-CM

## 2021-07-05 DIAGNOSIS — M19011 Primary osteoarthritis, right shoulder: Secondary | ICD-10-CM | POA: Diagnosis not present

## 2021-07-05 DIAGNOSIS — G8929 Other chronic pain: Secondary | ICD-10-CM

## 2021-07-05 DIAGNOSIS — G5603 Carpal tunnel syndrome, bilateral upper limbs: Secondary | ICD-10-CM

## 2021-07-05 DIAGNOSIS — M4722 Other spondylosis with radiculopathy, cervical region: Secondary | ICD-10-CM | POA: Diagnosis not present

## 2021-07-05 DIAGNOSIS — M25511 Pain in right shoulder: Secondary | ICD-10-CM

## 2021-07-05 DIAGNOSIS — M25512 Pain in left shoulder: Secondary | ICD-10-CM

## 2021-07-05 MED ORDER — DICLOFENAC SODIUM 1 % EX GEL: 4.0000 g | Freq: Four times a day (QID) | CUTANEOUS | 3 refills | Status: DC

## 2021-07-05 NOTE — Patient Instructions (Addendum)
Carpal Tunnel Syndrome  Carpal tunnel syndrome is a condition that causes pain in your hand and arm. The carpal tunnel is a narrow area located on the palm side of your wrist. Repeated wrist motion or certain diseases may cause swelling within the tunnel. This swelling pinches the main nerve in the wrist (median nerve). What are the causes? This condition may be caused by: Repeated wrist motions. Wrist injuries. Arthritis. A cyst or tumor in the carpal tunnel. Fluid buildup during pregnancy. Sometimes the cause of this condition is not known. What increases the risk? This condition is more likely to develop in: People who have jobs that cause them to repeatedly move their wrists in the same motion, such as Art gallery manager. Women. People with certain conditions, such as: Diabetes. Obesity. An underactive thyroid (hypothyroidism). Kidney failure. What are the signs or symptoms? Symptoms of this condition include: A tingling feeling in your fingers, especially in your thumb, index, and middle fingers. Tingling or numbness in your hand. An aching feeling in your entire arm, especially when your wrist and elbow are bent for long periods of time. Wrist pain that goes up your arm to your shoulder. Pain that goes down into your palm or fingers. A weak feeling in your hands. You may have trouble grabbing and holding items. Your symptoms may feel worse during the night. How is this diagnosed? This condition is diagnosed with a medical history and physical exam. You may also have tests, including: An electromyogram (EMG). This test measures electrical signals sent by your nerves into the muscles. X-rays. How is this treated? Treatment for this condition includes: Lifestyle changes. It is important to stop doing or modify the activity that caused your condition. Physical or occupational therapy. Medicines for pain and inflammation. This may include medicine that is injected into your  wrist. A wrist splint. Surgery. Follow these instructions at home: If you have a splint:  Wear it as told by your health care provider. Remove it only as told by your health care provider. Loosen the splint if your fingers become numb and tingle, or if they turn cold and blue. Keep the splint clean and dry. General instructions  Take over-the-counter and prescription medicines only as told by your health care provider. Rest your wrist from any activity that may be causing your pain. If your condition is work related, talk to your employer about changes that can be made, such as getting a wrist pad to use while typing. If directed, apply ice to the painful area: Put ice in a plastic bag. Place a towel between your skin and the bag. Leave the ice on for 20 minutes, 2-3 times per day. Keep all follow-up visits as told by your health care provider. This is important. Do any exercises as told by your health care provider, physical therapist, or occupational therapist. Contact a health care provider if: You have new symptoms. Your pain is not controlled with medicines. Your symptoms get worse. This information is not intended to replace advice given to you by your health care provider. Make sure you discuss any questions you have with your health care provider. Document Released: 09/05/2000 Document Revised: 01/17/2016 Document Reviewed: 05/20/2017 Elsevier Interactive Patient Education  2017 [employer].  Avoid overhead lifting and overhead use of the arms. Do not lift greater than 5 lbs. Adjust head rest in vehicle to prevent hyperextension if rear ended. Take extra precautions to avoid falling. Injection with steroid may be of benefit. Hemp CBD capsules,  amazon.com 5,000-7,000 mg per bottle, 60 capsules per bottle, take one capsule twice a day. Follow-Up Instructions: No follow-ups on file.
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
## 2021-07-19 ENCOUNTER — Other Ambulatory Visit: Payer: Self-pay

## 2021-07-19 ENCOUNTER — Ambulatory Visit (INDEPENDENT_AMBULATORY_CARE_PROVIDER_SITE_OTHER): Payer: 59 | Admitting: Physical Medicine and Rehabilitation

## 2021-07-19 ENCOUNTER — Encounter: Payer: Self-pay | Admitting: Physical Medicine and Rehabilitation

## 2021-07-19 DIAGNOSIS — R202 Paresthesia of skin: Secondary | ICD-10-CM | POA: Diagnosis not present

## 2021-07-19 NOTE — Progress Notes (Signed)
Neck and bilateral shoulder pain. Numbness in both hands- left worse than right. Worse in second through fifth fingers on left. Right hand dominant No lotion per patient

## 2021-07-21 NOTE — Progress Notes (Signed)
[name] - 60 y.o. female MRN [id number]  Date of birth: 1961-08-02  Office Visit Note: Visit Date: 07/19/2021 PCP: Laurey Morale, MD Referred by: Laurey Morale, MD  Subjective: Chief Complaint  Patient presents with   Right Hand - Numbness   Left Hand - Numbness   HPI:  [name] is a 60 y.o. female who comes in today at the request of Dr. Basil Dess for electrodiagnostic study of the Bilateral upper extremities.  Patient is Right hand dominant.  She describes worsening severe chronic neck pain with bilateral shoulder pain and numbness and tingling in both hands left more than right.  She reports the second through fifth digits on the left hand and somewhat more radial digits on the right but it can be the whole hand.  She is in chronic pain management with [facility] and despite that fact continues to have severe issues in the hands.  No prior electrodiagnostic study to review.   ROS Otherwise per HPI.  Assessment & Plan: Visit Diagnoses:    ICD-10-CM   1. Paresthesia of skin  R20.2 NCV with EMG (electromyography)      Plan: Impression: The above electrodiagnostic study is ABNORMAL and reveals evidence of a moderate to severe bilateral median nerve entrapment at the wrist (carpal tunnel syndrome) affecting sensory and motor components.   There is no significant electrodiagnostic evidence of any other focal nerve entrapment, brachial plexopathy or cervical radiculopathy.  As you know, this particular electrodiagnostic study cannot rule out chemical radiculitis or sensory only radiculopathy.  Recommendations: 1.  Follow-up with referring physician. 2.  Continue current management of symptoms. 3.  Continue use of resting splint at night-time and as needed during the day. 4.  Suggest surgical evaluation.  Meds & Orders: No orders of the defined types were placed in this encounter.   Orders Placed This Encounter  Procedures   NCV with EMG  (electromyography)    Follow-up: Return for Basil Dess, MD as scheduled.   Procedures: No procedures performed  EMG & NCV Findings: Evaluation of the left median motor and the right median motor nerves showed prolonged distal onset latency (L5.4, R6.0 ms) and decreased conduction velocity (Elbow-Wrist, L48, R49 m/s).  The left median (across palm) sensory nerve showed no response (Palm), prolonged distal peak latency (14.8 ms), and reduced amplitude (0.7 V).  The right median (across palm) sensory nerve showed no response (Wrist) and no response (Palm).  All remaining nerves (as indicated in the following tables) were within normal limits.  All left vs. right side differences were within normal limits.    All examined muscles (as indicated in the following table) showed no evidence of electrical instability.    Impression: The above electrodiagnostic study is ABNORMAL and reveals evidence of a moderate to severe bilateral median nerve entrapment at the wrist (carpal tunnel syndrome) affecting sensory and motor components.   There is no significant electrodiagnostic evidence of any other focal nerve entrapment, brachial plexopathy or cervical radiculopathy.  As you know, this particular electrodiagnostic study cannot rule out chemical radiculitis or sensory only radiculopathy.  Recommendations: 1.  Follow-up with referring physician. 2.  Continue current management of symptoms. 3.  Continue use of resting splint at night-time and as needed during the day. 4.  Suggest surgical evaluation.  ___________________________ Wonda Olds Board Certified, American Board of Physical Medicine and Rehabilitation    Nerve Conduction Studies Anti Sensory Summary Table   Stim Site NR  Peak (ms) Norm Peak (ms) P-T Amp (V) Norm P-T Amp Site1 Site2 Delta-P (ms) Dist (cm) Vel (m/s) Norm Vel (m/s)  Left Median Acr Palm Anti Sensory (2nd Digit)  32.3C  Wrist    *14.8 <3.6 *0.7 >10 Wrist Palm  0.0     Palm *NR  <2.0          Right Median Acr Palm Anti Sensory (2nd Digit)  32C  Wrist *NR  <3.6  >10        Palm *NR  <2.0          Left Radial Anti Sensory (Base 1st Digit)  32.2C  Wrist    2.1 <3.1 66.4  Wrist Base 1st Digit 2.1 0.0    Left Ulnar Anti Sensory (5th Digit)  32.5C  Wrist    3.5 <3.7 29.6 >15.0 Wrist 5th Digit 3.5 14.0 40 >38   Motor Summary Table   Stim Site NR Onset (ms) Norm Onset (ms) O-P Amp (mV) Norm O-P Amp Site1 Site2 Delta-0 (ms) Dist (cm) Vel (m/s) Norm Vel (m/s)  Left Median Motor (Abd Poll Brev)  32.4C  Wrist    *5.4 <4.2 7.4 >5 Elbow Wrist 4.8 23.0 *48 >50  Elbow    10.2  7.0         Right Median Motor (Abd Poll Brev)  32C  Wrist    *6.0 <4.2 5.6 >5 Elbow Wrist 4.7 23.0 *49 >50  Elbow    10.7  5.5         Left Ulnar Motor (Abd Dig Min)  32.5C  Wrist    3.0 <4.2 8.4 >3 B Elbow Wrist 4.0 21.0 53 >53  B Elbow    7.0  3.0  A Elbow B Elbow 1.4 10.0 71 >53  A Elbow    8.4  2.2          EMG   Side Muscle Nerve Root Ins Act Fibs Psw Amp Dur Poly Recrt Int Fraser Din Comment  Left 1stDorInt Ulnar C8-T1 Nml Nml Nml Nml Nml 0 Nml Nml   Left Abd Poll Brev Median C8-T1 Nml Nml Nml Nml Nml 0 Nml Nml   Left ExtDigCom   Nml Nml Nml Nml Nml 0 Nml Nml   Left Triceps Radial C6-7-8 Nml Nml Nml Nml Nml 0 Nml Nml   Left Deltoid Axillary C5-6 Nml Nml Nml Nml Nml 0 Nml Nml     Nerve Conduction Studies Anti Sensory Left/Right Comparison   Stim Site L Lat (ms) R Lat (ms) L-R Lat (ms) L Amp (V) R Amp (V) L-R Amp (%) Site1 Site2 L Vel (m/s) R Vel (m/s) L-R Vel (m/s)  Median Acr Palm Anti Sensory (2nd Digit)  32.3C  Wrist *14.8   *0.7   Wrist Palm     Palm             Radial Anti Sensory (Base 1st Digit)  32.2C  Wrist 2.1   66.4   Wrist Base 1st Digit     Ulnar Anti Sensory (5th Digit)  32.5C  Wrist 3.5   29.6   Wrist 5th Digit 40     Motor Left/Right Comparison   Stim Site L Lat (ms) R Lat (ms) L-R Lat (ms) L Amp (mV) R Amp (mV) L-R Amp (%) Site1 Site2 L Vel (m/s) R  Vel (m/s) L-R Vel (m/s)  Median Motor (Abd Poll Brev)  32.4C  Wrist *5.4 *6.0 0.6 7.4 5.6 24.3 Elbow Wrist *48 *49 1  Elbow 10.2  10.7 0.5 7.0 5.5 21.4       Ulnar Motor (Abd Dig Min)  32.5C  Wrist 3.0   8.4   B Elbow Wrist 53    B Elbow 7.0   3.0   A Elbow B Elbow 71    A Elbow 8.4   2.2            Waveforms:               Clinical History: No specialty comments available.     Objective:  VS:  HT:    WT:   BMI:     BP:   HR: bpm  TEMP: ( )  RESP:  Physical Exam Musculoskeletal:        General: No swelling, tenderness or deformity.     Comments: Inspection reveals no atrophy of the bilateral APB or FDI or hand intrinsics. There is no swelling, color changes, allodynia or dystrophic changes. There is 5 out of 5 strength in the bilateral wrist extension, finger abduction and long finger flexion.  There is impaired sensation to light touch in a median nerve distribution on the right.  There is a positive Phalen's test bilaterally. There is a negative Hoffmann's test bilaterally.  Skin:    General: Skin is warm and dry.     Findings: No erythema or rash.  Neurological:     General: No focal deficit present.     Mental Status: She is alert and oriented to person, place, and time.     Motor: No weakness or abnormal muscle tone.     Coordination: Coordination normal.  Psychiatric:        Mood and Affect: Mood normal.        Behavior: Behavior normal.     Imaging: No results found.

## 2021-07-21 NOTE — Procedures (Signed)
EMG & NCV Findings: Evaluation of the left median motor and the right median motor nerves showed prolonged distal onset latency (L5.4, R6.0 ms) and decreased conduction velocity (Elbow-Wrist, L48, R49 m/s).  The left median (across palm) sensory nerve showed no response (Palm), prolonged distal peak latency (14.8 ms), and reduced amplitude (0.7 V).  The right median (across palm) sensory nerve showed no response (Wrist) and no response (Palm).  All remaining nerves (as indicated in the following tables) were within normal limits.  All left vs. right side differences were within normal limits.    All examined muscles (as indicated in the following table) showed no evidence of electrical instability.    Impression: The above electrodiagnostic study is ABNORMAL and reveals evidence of a moderate to severe bilateral median nerve entrapment at the wrist (carpal tunnel syndrome) affecting sensory and motor components.   There is no significant electrodiagnostic evidence of any other focal nerve entrapment, brachial plexopathy or cervical radiculopathy.  As you know, this particular electrodiagnostic study cannot rule out chemical radiculitis or sensory only radiculopathy.  Recommendations: 1.  Follow-up with referring physician. 2.  Continue current management of symptoms. 3.  Continue use of resting splint at night-time and as needed during the day. 4.  Suggest surgical evaluation.  ___________________________ Laurence Spates FAAPMR Board Certified, American Board of Physical Medicine and Rehabilitation    Nerve Conduction Studies Anti Sensory Summary Table   Stim Site NR Peak (ms) Norm Peak (ms) P-T Amp (V) Norm P-T Amp Site1 Site2 Delta-P (ms) Dist (cm) Vel (m/s) Norm Vel (m/s)  Left Median Acr Palm Anti Sensory (2nd Digit)  32.3C  Wrist    *14.8 <3.6 *0.7 >10 Wrist Palm  0.0    Palm *NR  <2.0          Right Median Acr Palm Anti Sensory (2nd Digit)  32C  Wrist *NR  <3.6  >10        Palm  *NR  <2.0          Left Radial Anti Sensory (Base 1st Digit)  32.2C  Wrist    2.1 <3.1 66.4  Wrist Base 1st Digit 2.1 0.0    Left Ulnar Anti Sensory (5th Digit)  32.5C  Wrist    3.5 <3.7 29.6 >15.0 Wrist 5th Digit 3.5 14.0 40 >38   Motor Summary Table   Stim Site NR Onset (ms) Norm Onset (ms) O-P Amp (mV) Norm O-P Amp Site1 Site2 Delta-0 (ms) Dist (cm) Vel (m/s) Norm Vel (m/s)  Left Median Motor (Abd Poll Brev)  32.4C  Wrist    *5.4 <4.2 7.4 >5 Elbow Wrist 4.8 23.0 *48 >50  Elbow    10.2  7.0         Right Median Motor (Abd Poll Brev)  32C  Wrist    *6.0 <4.2 5.6 >5 Elbow Wrist 4.7 23.0 *49 >50  Elbow    10.7  5.5         Left Ulnar Motor (Abd Dig Min)  32.5C  Wrist    3.0 <4.2 8.4 >3 B Elbow Wrist 4.0 21.0 53 >53  B Elbow    7.0  3.0  A Elbow B Elbow 1.4 10.0 71 >53  A Elbow    8.4  2.2          EMG   Side Muscle Nerve Root Ins Act Fibs Psw Amp Dur Poly Recrt Int Fraser Din Comment  Left 1stDorInt Ulnar C8-T1 Nml Nml Nml Nml Nml 0 Nml Nml  Left Abd Poll Brev Median C8-T1 Nml Nml Nml Nml Nml 0 Nml Nml   Left ExtDigCom   Nml Nml Nml Nml Nml 0 Nml Nml   Left Triceps Radial C6-7-8 Nml Nml Nml Nml Nml 0 Nml Nml   Left Deltoid Axillary C5-6 Nml Nml Nml Nml Nml 0 Nml Nml     Nerve Conduction Studies Anti Sensory Left/Right Comparison   Stim Site L Lat (ms) R Lat (ms) L-R Lat (ms) L Amp (V) R Amp (V) L-R Amp (%) Site1 Site2 L Vel (m/s) R Vel (m/s) L-R Vel (m/s)  Median Acr Palm Anti Sensory (2nd Digit)  32.3C  Wrist *14.8   *0.7   Wrist Palm     Palm             Radial Anti Sensory (Base 1st Digit)  32.2C  Wrist 2.1   66.4   Wrist Base 1st Digit     Ulnar Anti Sensory (5th Digit)  32.5C  Wrist 3.5   29.6   Wrist 5th Digit 40     Motor Left/Right Comparison   Stim Site L Lat (ms) R Lat (ms) L-R Lat (ms) L Amp (mV) R Amp (mV) L-R Amp (%) Site1 Site2 L Vel (m/s) R Vel (m/s) L-R Vel (m/s)  Median Motor (Abd Poll Brev)  32.4C  Wrist *5.4 *6.0 0.6 7.4 5.6 24.3 Elbow Wrist *48  *49 1  Elbow 10.2 10.7 0.5 7.0 5.5 21.4       Ulnar Motor (Abd Dig Min)  32.5C  Wrist 3.0   8.4   B Elbow Wrist 53    B Elbow 7.0   3.0   A Elbow B Elbow 71    A Elbow 8.4   2.2            Waveforms:

## 2021-07-27 ENCOUNTER — Other Ambulatory Visit: Payer: Self-pay | Admitting: Family Medicine

## 2021-08-02 ENCOUNTER — Ambulatory Visit (INDEPENDENT_AMBULATORY_CARE_PROVIDER_SITE_OTHER): Payer: 59 | Admitting: Specialist

## 2021-08-02 ENCOUNTER — Encounter: Payer: Self-pay | Admitting: Specialist

## 2021-08-02 ENCOUNTER — Other Ambulatory Visit: Payer: Self-pay

## 2021-08-02 VITALS — BP 177/91 | HR 88 | Ht 67.0 in | Wt 236.0 lb

## 2021-08-02 DIAGNOSIS — Q7649 Other congenital malformations of spine, not associated with scoliosis: Secondary | ICD-10-CM

## 2021-08-02 DIAGNOSIS — M5030 Other cervical disc degeneration, unspecified cervical region: Secondary | ICD-10-CM

## 2021-08-02 DIAGNOSIS — M19011 Primary osteoarthritis, right shoulder: Secondary | ICD-10-CM | POA: Diagnosis not present

## 2021-08-02 DIAGNOSIS — G5603 Carpal tunnel syndrome, bilateral upper limbs: Secondary | ICD-10-CM | POA: Diagnosis not present

## 2021-08-02 DIAGNOSIS — R29898 Other symptoms and signs involving the musculoskeletal system: Secondary | ICD-10-CM

## 2021-08-02 DIAGNOSIS — M48062 Spinal stenosis, lumbar region with neurogenic claudication: Secondary | ICD-10-CM

## 2021-08-02 NOTE — Patient Instructions (Signed)
Carpal Tunnel Syndrome  Carpal tunnel syndrome is a condition that causes pain in your hand and arm. The carpal tunnel is a narrow area located on the palm side of your wrist. Repeated wrist motion or certain diseases may cause swelling within the tunnel. This swelling pinches the main nerve in the wrist (median nerve). What are the causes? This condition may be caused by: Repeated wrist motions. Wrist injuries. Arthritis. A cyst or tumor in the carpal tunnel. Fluid buildup during pregnancy. Sometimes the cause of this condition is not known. What increases the risk? This condition is more likely to develop in: People who have jobs that cause them to repeatedly move their wrists in the same motion, such as Art gallery manager. Women. People with certain conditions, such as: Diabetes. Obesity. An underactive thyroid (hypothyroidism). Kidney failure. What are the signs or symptoms? Symptoms of this condition include: A tingling feeling in your fingers, especially in your thumb, index, and middle fingers. Tingling or numbness in your hand. An aching feeling in your entire arm, especially when your wrist and elbow are bent for long periods of time. Wrist pain that goes up your arm to your shoulder. Pain that goes down into your palm or fingers. A weak feeling in your hands. You may have trouble grabbing and holding items. Your symptoms may feel worse during the night. How is this diagnosed? This condition is diagnosed with a medical history and physical exam. You may also have tests, including: An electromyogram (EMG). This test measures electrical signals sent by your nerves into the muscles. X-rays. How is this treated? Treatment for this condition includes: Lifestyle changes. It is important to stop doing or modify the activity that caused your condition. Physical or occupational therapy. Medicines for pain and inflammation. This may include medicine that is injected into your  wrist. A wrist splint. Surgery. Follow these instructions at home: If you have a splint:  Wear it as told by your health care provider. Remove it only as told by your health care provider. Loosen the splint if your fingers become numb and tingle, or if they turn cold and blue. Keep the splint clean and dry. General instructions  Take over-the-counter and prescription medicines only as told by your health care provider. Rest your wrist from any activity that may be causing your pain. If your condition is work related, talk to your employer about changes that can be made, such as getting a wrist pad to use while typing. If directed, apply ice to the painful area: Put ice in a plastic bag. Place a towel between your skin and the bag. Leave the ice on for 20 minutes, 2-3 times per day. Keep all follow-up visits as told by your health care provider. This is important. Do any exercises as told by your health care provider, physical therapist, or occupational therapist. Contact a health care provider if: You have new symptoms. Your pain is not controlled with medicines. Your symptoms get worse. This information is not intended to replace advice given to you by your health care provider. Make sure you discuss any questions you have with your health care provider. Document Released: 09/05/2000 Document Revised: 01/17/2016 Document Reviewed: 05/20/2017 Elsevier Interactive Patient Education  2017 [place].  Vitamin B Complex daily Fish oil or Creel oil daily

## 2021-08-02 NOTE — Progress Notes (Signed)
Office Visit Note   Patient: [name]           Date of Birth: [birth year]           MRN: [id number] Visit Date: 08/02/2021              Requested by: Laurey Morale, MD [place],  [place] [id number] PCP: Laurey Morale, MD   Assessment & Plan: Visit Diagnoses:  1. Bilateral carpal tunnel syndrome   2. Arthritis of right acromioclavicular joint   3. Weakness of shoulder   4. Spinal stenosis of lumbar region with neurogenic claudication   5. Congenital stenosis of lumbar spine   6. Other cervical disc degeneration, unspecified cervical region     Plan: Carpal Tunnel Syndrome  Carpal tunnel syndrome is a condition that causes pain in your hand and arm. The carpal tunnel is a narrow area located on the palm side of your wrist. Repeated wrist motion or certain diseases may cause swelling within the tunnel. This swelling pinches the main nerve in the wrist (median nerve). What are the causes? This condition may be caused by: Repeated wrist motions. Wrist injuries. Arthritis. A cyst or tumor in the carpal tunnel. Fluid buildup during pregnancy. Sometimes the cause of this condition is not known. What increases the risk? This condition is more likely to develop in: People who have jobs that cause them to repeatedly move their wrists in the same motion, such as Art gallery manager. Women. People with certain conditions, such as: Diabetes. Obesity. An underactive thyroid (hypothyroidism). Kidney failure. What are the signs or symptoms? Symptoms of this condition include: A tingling feeling in your fingers, especially in your thumb, index, and middle fingers. Tingling or numbness in your hand. An aching feeling in your entire arm, especially when your wrist and elbow are bent for long periods of time. Wrist pain that goes up your arm to your shoulder. Pain that goes down into your palm or fingers. A weak feeling in your hands. You may have trouble  grabbing and holding items. Your symptoms may feel worse during the night. How is this diagnosed? This condition is diagnosed with a medical history and physical exam. You may also have tests, including: An electromyogram (EMG). This test measures electrical signals sent by your nerves into the muscles. X-rays. How is this treated? Treatment for this condition includes: Lifestyle changes. It is important to stop doing or modify the activity that caused your condition. Physical or occupational therapy. Medicines for pain and inflammation. This may include medicine that is injected into your wrist. A wrist splint. Surgery. Follow these instructions at home: If you have a splint:  Wear it as told by your health care provider. Remove it only as told by your health care provider. Loosen the splint if your fingers become numb and tingle, or if they turn cold and blue. Keep the splint clean and dry. General instructions  Take over-the-counter and prescription medicines only as told by your health care provider. Rest your wrist from any activity that may be causing your pain. If your condition is work related, talk to your employer about changes that can be made, such as getting a wrist pad to use while typing. If directed, apply ice to the painful area: Put ice in a plastic bag. Place a towel between your skin and the bag. Leave the ice on for 20 minutes, 2-3 times per day. Keep all follow-up visits as told by your  health care provider. This is important. Do any exercises as told by your health care provider, physical therapist, or occupational therapist. Contact a health care provider if: You have new symptoms. Your pain is not controlled with medicines. Your symptoms get worse. This information is not intended to replace advice given to you by your health care provider. Make sure you discuss any questions you have with your health care provider. Document Released: 09/05/2000 Document  Revised: 01/17/2016 Document Reviewed: 05/20/2017 Elsevier Interactive Patient Education  2017 [place].  Vitamin B Complex daily Fish oil or Creel oil daily Follow-Up Instructions: No follow-ups on file.   Orders:  No orders of the defined types were placed in this encounter.  No orders of the defined types were placed in this encounter.     Procedures: No procedures performed   Clinical Data: No additional findings.   Subjective: Chief Complaint  Patient presents with   Right Hand - Follow-up   Left Hand - Follow-up    HPI  Review of Systems   Objective: Vital Signs: BP (!) 177/91 (BP Location: Left Arm, Patient Position: Sitting)   Pulse 88   Ht 5\' 7"  (1.702 m)   Wt 236 lb (107 kg)   BMI 36.96 kg/m   Physical Exam  Ortho Exam  Specialty Comments:  No specialty comments available.  Imaging: No results found.   PMFS History: Patient Active Problem List   Diagnosis Date Noted   DDD (degenerative disc disease), lumbar 07/16/2018    Priority: High    Class: Chronic   Other spondylosis with radiculopathy, lumbar region 07/16/2018    Priority: High    Class: Chronic   Neuropathy 10/19/2020   S/P lumbar fusion 07/13/2018   Tenosynovitis, de Quervain 08/10/2017   Multiple lipomas 07/15/2017   Insomnia 07/16/2016   Left groin pain 11/30/2015   Morbid obesity with BMI of 40.0-44.9, adult ([facility]) 10/26/2015   Malignant neoplasm of lower-inner quadrant of right breast of female, estrogen receptor positive ([facility]) 08/08/2015   S/P revision of total knee 07/24/2015   Acute postoperative pain 07/11/2015   Loose total knee arthroplasty ([facility]) 07/02/2015   Type 2 diabetes mellitus without complications ([facility]) 26/37/8588   Adhesive capsulitis of left shoulder 10/11/2014   Paraspinous mass, left 03/31/2013   Other symptoms and signs involving the nervous system 03/31/2013   Internal hemorrhoid 02/17/2013   Abdominal pain, other specified site 02/17/2013    Neoplasm of soft tissue, left shoulder and left upper arm 08/03/2012   Lightheadedness 01/29/2012   Cholelithiasis with cholecystitis 10/08/2011   Hypokalemia 09/17/2011   Gastro-esophageal reflux disease without esophagitis 08/08/2010   Gout 12/18/2008   Essential (primary) hypertension 03/15/2007   OA (osteoarthritis) of knee 03/15/2007   HEADACHE 03/15/2007   Past Medical History:  Diagnosis Date   Breast cancer ([facility]) 2017   right breast   Breast cancer of lower-inner quadrant of right female breast ([facility]) 08/08/2015   Colon polyps    DDD (degenerative disc disease), cervical    Degenerative joint disease    Diabetes mellitus without complication ([facility])    Diverticulosis    Gallstones    GERD (gastroesophageal reflux disease)    Headache(784.0)    migraine like per patient   Hypertension    Low back pain    dr Hardin Negus, pain management   Osteoarthritis    Personal history of chemotherapy 2017   Personal history of radiation therapy 2017    Family History  Problem Relation Age of Onset  Stomach cancer Mother    Heart disease Father    Prostate cancer Father    Kidney cancer Sister        one out of the four   Hypertension Sister    Hypertension Brother    Crohn's disease Other        nephew   Colon cancer Maternal Grandmother    Esophageal cancer Neg Hx     Past Surgical History:  Procedure Laterality Date   CHOLECYSTECTOMY  10/28/2011   Procedure: LAPAROSCOPIC CHOLECYSTECTOMY WITH INTRAOPERATIVE CHOLANGIOGRAM;  Surgeon: Earnstine Regal, MD;  Location: [facility];  Service: General;  Laterality: N/A;   COLONOSCOPY WITH ESOPHAGOGASTRODUODENOSCOPY (EGD)  04/13/2018   per Dr. Henrene Pastor, adenomatous polyp, repeat in 5 yrs    EXAM UNDER ANESTHESIA WITH MANIPULATION OF KNEE Right 05/30/2003   EXAM UNDER ANESTHESIA WITH MANIPULATION OF KNEE Left 12/27/2002   GANGLION CYST EXCISION Right    right wrist   KNEE ARTHROSCOPY Bilateral 01/02/2000   KNEE ARTHROSCOPY Right     LAPAROSCOPIC VAGINAL HYSTERECTOMY WITH SALPINGO OOPHORECTOMY Bilateral 07/13/2006   LIPOMA EXCISION Right 02/21/2008   hip   MASS EXCISION  08/24/2012   Procedure: EXCISION MASS;  Surgeon: Earnstine Regal, MD;  Location: [facility];  Service: General;  Laterality: Left;  excisie soft tissue masses left shoulder(back) & left upper arm   MASS EXCISION Left 01/24/2014   Procedure: EXCISION SOFT TISSUE MASSES LOWER LEFT BACK;  Surgeon: Earnstine Regal, MD;  Location: [facility];  Service: General;  Laterality: Left;   MASS EXCISION Right 04/30/2016   Procedure: EXCISION OF SKIN RIGHT CHEST WALL;  Surgeon: Autumn Messing III, MD;  Location: [facility];  Service: General;  Laterality: Right;  EXCISION OF SKIN RIGHT CHEST WALL   MASTECTOMY Right 2017   MASTECTOMY W/ SENTINEL NODE BIOPSY Right 10/01/2015   Procedure: RIGHT MASTECTOMY WITH SENTINEL LYMPH NODE BIOPSY;  Surgeon: Autumn Messing III, MD;  Location: [facility];  Service: General;  Laterality: Right;   PORT-A-CATH REMOVAL Left 04/30/2016   Procedure: REMOVAL PORT-A-CATH;  Surgeon: Autumn Messing III, MD;  Location: [facility];  Service: General;  Laterality: Left;  REMOVAL PORT-A-CATH   PORTACATH PLACEMENT Left 11/01/2015   Procedure: INSERTION PORT-A-CATH;  Surgeon: Autumn Messing III, MD;  Location: [facility];  Service: General;  Laterality: Left;   REPLACEMENT UNICONDYLAR JOINT KNEE Left 04/20/2001   REVISION TOTAL KNEE ARTHROPLASTY Right 06/18/2004   REVISION TOTAL KNEE ARTHROPLASTY Left 11/15/2002   REVISION TOTAL KNEE ARTHROPLASTY Left 10/12/2001   REVISION TOTAL KNEE ARTHROPLASTY Right 07/09/2015   TONSILLECTOMY     TOTAL KNEE ARTHROPLASTY Right 04/25/2003   TOTAL KNEE ARTHROPLASTY Left 06/25/2009   Social History   Occupational History   Occupation: retired/disabled  Tobacco Use   Smoking status: Some Days    Packs/day: 0.25    Years: 20.00    Pack years: 5.00    Types: Cigarettes     Last attempt to quit: 05/08/2020    Years since quitting: 1.2   Smokeless tobacco: Former    Quit date: 09/30/2015   Tobacco comments:    smokes about 4 cigs/day; working on quitting - 08/22/16 gwd  Vaping Use   Vaping Use: Never used  Substance and Sexual Activity   Alcohol use: No    Alcohol/week: 0.0 standard drinks   Drug use: No   Sexual activity: Yes    Birth control/protection: Post-menopausal

## 2021-08-10 ENCOUNTER — Other Ambulatory Visit: Payer: Self-pay | Admitting: Family Medicine

## 2021-08-17 ENCOUNTER — Other Ambulatory Visit: Payer: Self-pay | Admitting: Family Medicine

## 2021-08-20 ENCOUNTER — Telehealth: Payer: Self-pay | Admitting: Specialist

## 2021-08-20 NOTE — Telephone Encounter (Signed)
Pt called stating she had an appt on 08/02/21 with Dr.Nitka and discussed being referred to a hand specialist for a possible surgery. Pt states no one has called her yet and she would like a CB to make sure this is still the plan.  [phone]

## 2021-08-23 ENCOUNTER — Other Ambulatory Visit: Payer: Self-pay

## 2021-08-23 ENCOUNTER — Ambulatory Visit (INDEPENDENT_AMBULATORY_CARE_PROVIDER_SITE_OTHER): Payer: 59 | Admitting: Orthopedic Surgery

## 2021-08-23 ENCOUNTER — Encounter: Payer: Self-pay | Admitting: Orthopedic Surgery

## 2021-08-23 DIAGNOSIS — G5603 Carpal tunnel syndrome, bilateral upper limbs: Secondary | ICD-10-CM | POA: Diagnosis not present

## 2021-08-23 DIAGNOSIS — G5602 Carpal tunnel syndrome, left upper limb: Secondary | ICD-10-CM | POA: Insufficient documentation

## 2021-08-23 NOTE — Progress Notes (Signed)
Office Visit Note   Patient: [name]           Date of Birth: 06-Sep-1961           MRN: [id number] Visit Date: 08/23/2021              Requested by: Laurey Morale, MD [place],  [place] [address] PCP: Laurey Morale, MD   Assessment & Plan: Visit Diagnoses:  1. Carpal tunnel syndrome, bilateral     Plan: We discussed the diagnosis, prognosis, and both conservative and operative treatment options for carpal tunnel syndrome.  After our discussion, the patient has elected to proceed with right carpal tunnel release.  We reviewed the benefits of surgery and the potential risks including, but not limited to, persistent symptoms, infection, damage to nearby nerves and blood vessels, delayed wound healing.    All patient concerns and questions were addressed.  A surgical date will be confirmed with the patient.    Follow-Up Instructions: No follow-ups on file.   Orders:  No orders of the defined types were placed in this encounter.  No orders of the defined types were placed in this encounter.     Procedures: No procedures performed   Clinical Data: No additional findings.   Subjective: Chief Complaint  Patient presents with   Right Hand - New Patient (Initial Visit)   Left Hand - New Patient (Initial Visit)    This is a 60 year old right-hand-dominant female who presents with numbness and tingling involving bilateral fingers for at least a year now.  She describes most of her tingling in the thumb, index, and middle finger as well as part of her ring finger.  She has nocturnal symptoms 4 nights per week in which she has to run water over her fingers for symptom relief.  She is wearing night braces which she thinks is helped some.  She does have a history of type 2 diabetes.  Her right side is more symptomatic than the left.  Nerve conduction study/EMG on 10/28 suggested bilateral moderate to severe carpal tunnel syndrome.   Review of  Systems   Objective: Vital Signs: BP 138/81 (BP Location: Left Arm, Patient Position: Sitting, Cuff Size: Large)   Pulse 82   Ht 5\' 7"  (1.702 m)   Wt 236 lb (107 kg)   SpO2 95%   BMI 36.96 kg/m   Physical Exam Constitutional:      Appearance: Normal appearance.  Cardiovascular:     Rate and Rhythm: Normal rate.     Pulses: Normal pulses.  Pulmonary:     Effort: Pulmonary effort is normal.  Skin:    General: Skin is warm and dry.     Capillary Refill: Capillary refill takes less than 2 seconds.  Neurological:     Mental Status: She is alert.    Right Hand Exam   Tenderness  The patient is experiencing no tenderness.   Range of Motion  The patient has normal right wrist ROM.   Muscle Strength  The patient has normal right wrist strength.  Other  Erythema: absent Sensation: normal Pulse: present  Comments:  + Tinel, CT compression, and Phalen's sign with symptoms into index and middle fingers.  No thenar atrophy w/ 5/5 thenar motor strength.  Equivocal Tinel at elbow.    Left Hand Exam   Comments:  + Tinel, CT compression, and Phalen's sign with symptoms into index and middle fingers.  No thenar atrophy w/ 5/5 thenar  motor strength.  Equivocal Tinel at elbow.      Specialty Comments:  No specialty comments available.  Imaging: No results found.   PMFS History: Patient Active Problem List   Diagnosis Date Noted   Carpal tunnel syndrome, bilateral 08/23/2021   Neuropathy 10/19/2020   DDD (degenerative disc disease), lumbar 07/16/2018    Class: Chronic   Other spondylosis with radiculopathy, lumbar region 07/16/2018    Class: Chronic   S/P lumbar fusion 07/13/2018   Tenosynovitis, de Quervain 08/10/2017   Multiple lipomas 07/15/2017   Insomnia 07/16/2016   Left groin pain 11/30/2015   Morbid obesity with BMI of 40.0-44.9, adult ([place]) 10/26/2015   Malignant neoplasm of lower-inner quadrant of right breast of female, estrogen receptor positive ([place])  08/08/2015   S/P revision of total knee 07/24/2015   Acute postoperative pain 07/11/2015   Loose total knee arthroplasty ([place]) 07/02/2015   Type 2 diabetes mellitus without complications ([place]) 85/63/1497   Adhesive capsulitis of left shoulder 10/11/2014   Paraspinous mass, left 03/31/2013   Other symptoms and signs involving the nervous system 03/31/2013   Internal hemorrhoid 02/17/2013   Abdominal pain, other specified site 02/17/2013   Neoplasm of soft tissue, left shoulder and left upper arm 08/03/2012   Lightheadedness 01/29/2012   Cholelithiasis with cholecystitis 10/08/2011   Hypokalemia 09/17/2011   Gastro-esophageal reflux disease without esophagitis 08/08/2010   Gout 12/18/2008   Essential (primary) hypertension 03/15/2007   OA (osteoarthritis) of knee 03/15/2007   HEADACHE 03/15/2007   Past Medical History:  Diagnosis Date   Breast cancer ([place]) 2017   right breast   Breast cancer of lower-inner quadrant of right female breast ([employer]) 08/08/2015   Colon polyps    DDD (degenerative disc disease), cervical    Degenerative joint disease    Diabetes mellitus without complication ([facility])    Diverticulosis    Gallstones    GERD (gastroesophageal reflux disease)    Headache(784.0)    migraine like per patient   Hypertension    Low back pain    dr Hardin Negus, pain management   Osteoarthritis    Personal history of chemotherapy 2017   Personal history of radiation therapy 2017    Family History  Problem Relation Age of Onset   Stomach cancer Mother    Heart disease Father    Prostate cancer Father    Kidney cancer Sister        one out of the four   Hypertension Sister    Hypertension Brother    Crohn's disease Other        nephew   Colon cancer Maternal Grandmother    Esophageal cancer Neg Hx     Past Surgical History:  Procedure Laterality Date   CHOLECYSTECTOMY  10/28/2011   Procedure: LAPAROSCOPIC CHOLECYSTECTOMY WITH INTRAOPERATIVE CHOLANGIOGRAM;  Surgeon: Earnstine Regal, MD;  Location: [facility];  Service: General;  Laterality: N/A;   COLONOSCOPY WITH ESOPHAGOGASTRODUODENOSCOPY (EGD)  04/13/2018   per Dr. Henrene Pastor, adenomatous polyp, repeat in 5 yrs    EXAM UNDER ANESTHESIA WITH MANIPULATION OF KNEE Right 05/30/2003   EXAM UNDER ANESTHESIA WITH MANIPULATION OF KNEE Left 12/27/2002   GANGLION CYST EXCISION Right    right wrist   KNEE ARTHROSCOPY Bilateral 01/02/2000   KNEE ARTHROSCOPY Right    LAPAROSCOPIC VAGINAL HYSTERECTOMY WITH SALPINGO OOPHORECTOMY Bilateral 07/13/2006   LIPOMA EXCISION Right 02/21/2008   hip   MASS EXCISION  08/24/2012   Procedure: EXCISION MASS;  Surgeon: Earnstine Regal, MD;  Location: [place] [facility];  Service: General;  Laterality: Left;  excisie soft tissue masses left shoulder(back) & left upper arm   MASS EXCISION Left 01/24/2014   Procedure: EXCISION SOFT TISSUE MASSES LOWER LEFT BACK;  Surgeon: Earnstine Regal, MD;  Location: [place];  Service: General;  Laterality: Left;   MASS EXCISION Right 04/30/2016   Procedure: EXCISION OF SKIN RIGHT CHEST WALL;  Surgeon: Autumn Messing III, MD;  Location: [place];  Service: General;  Laterality: Right;  EXCISION OF SKIN RIGHT CHEST WALL   MASTECTOMY Right 2017   MASTECTOMY W/ SENTINEL NODE BIOPSY Right 10/01/2015   Procedure: RIGHT MASTECTOMY WITH SENTINEL LYMPH NODE BIOPSY;  Surgeon: Autumn Messing III, MD;  Location: [place];  Service: General;  Laterality: Right;   PORT-A-CATH REMOVAL Left 04/30/2016   Procedure: REMOVAL PORT-A-CATH;  Surgeon: Autumn Messing III, MD;  Location: [place];  Service: General;  Laterality: Left;  REMOVAL PORT-A-CATH   PORTACATH PLACEMENT Left 11/01/2015   Procedure: INSERTION PORT-A-CATH;  Surgeon: Autumn Messing III, MD;  Location: [facility];  Service: General;  Laterality: Left;   REPLACEMENT UNICONDYLAR JOINT KNEE Left 04/20/2001   REVISION TOTAL KNEE ARTHROPLASTY Right 06/18/2004   REVISION TOTAL KNEE  ARTHROPLASTY Left 11/15/2002   REVISION TOTAL KNEE ARTHROPLASTY Left 10/12/2001   REVISION TOTAL KNEE ARTHROPLASTY Right 07/09/2015   TONSILLECTOMY     TOTAL KNEE ARTHROPLASTY Right 04/25/2003   TOTAL KNEE ARTHROPLASTY Left 06/25/2009   Social History   Occupational History   Occupation: retired/disabled  Tobacco Use   Smoking status: Some Days    Packs/day: 0.25    Years: 20.00    Pack years: 5.00    Types: Cigarettes    Last attempt to quit: 05/08/2020    Years since quitting: 1.2   Smokeless tobacco: Former    Quit date: 09/30/2015   Tobacco comments:    smokes about 4 cigs/day; working on quitting - 08/22/16 gwd  Vaping Use   Vaping Use: Never used  Substance and Sexual Activity   Alcohol use: No    Alcohol/week: 0.0 standard drinks   Drug use: No   Sexual activity: Yes    Birth control/protection: Post-menopausal

## 2021-08-26 ENCOUNTER — Other Ambulatory Visit: Payer: Self-pay

## 2021-08-26 ENCOUNTER — Inpatient Hospital Stay: Payer: 59

## 2021-08-26 ENCOUNTER — Inpatient Hospital Stay: Payer: 59 | Attending: Oncology | Admitting: Oncology

## 2021-08-26 VITALS — BP 156/73 | HR 95 | Temp 97.7°F | Resp 18 | Wt 263.4 lb

## 2021-08-26 DIAGNOSIS — C50311 Malignant neoplasm of lower-inner quadrant of right female breast: Secondary | ICD-10-CM

## 2021-08-26 DIAGNOSIS — Z9011 Acquired absence of right breast and nipple: Secondary | ICD-10-CM | POA: Insufficient documentation

## 2021-08-26 DIAGNOSIS — Z90722 Acquired absence of ovaries, bilateral: Secondary | ICD-10-CM | POA: Insufficient documentation

## 2021-08-26 DIAGNOSIS — Z170 Estrogen receptor positive status [ER+]: Secondary | ICD-10-CM

## 2021-08-26 DIAGNOSIS — Z8051 Family history of malignant neoplasm of kidney: Secondary | ICD-10-CM | POA: Insufficient documentation

## 2021-08-26 DIAGNOSIS — Z90710 Acquired absence of both cervix and uterus: Secondary | ICD-10-CM | POA: Insufficient documentation

## 2021-08-26 DIAGNOSIS — Z853 Personal history of malignant neoplasm of breast: Secondary | ICD-10-CM | POA: Insufficient documentation

## 2021-08-26 DIAGNOSIS — Z9079 Acquired absence of other genital organ(s): Secondary | ICD-10-CM | POA: Diagnosis not present

## 2021-08-26 DIAGNOSIS — Z86010 Personal history of colonic polyps: Secondary | ICD-10-CM | POA: Insufficient documentation

## 2021-08-26 DIAGNOSIS — Z800 Family history of malignant neoplasm of digestive organs: Secondary | ICD-10-CM | POA: Diagnosis not present

## 2021-08-26 DIAGNOSIS — Z720 Tobacco use: Secondary | ICD-10-CM | POA: Insufficient documentation

## 2021-08-26 LAB — CBC WITH DIFFERENTIAL/PLATELET
Abs Immature Granulocytes: 0.02 10*3/uL (ref 0.00–0.07)
Basophils Absolute: 0 10*3/uL (ref 0.0–0.1)
Basophils Relative: 0 %
Eosinophils Absolute: 0.1 10*3/uL (ref 0.0–0.5)
Eosinophils Relative: 1 %
HCT: 38.4 % (ref 36.0–46.0)
Hemoglobin: 13 g/dL (ref 12.0–15.0)
Immature Granulocytes: 0 %
Lymphocytes Relative: 40 %
Lymphs Abs: 3.4 10*3/uL (ref 0.7–4.0)
MCH: 30.4 pg (ref 26.0–34.0)
MCHC: 33.9 g/dL (ref 30.0–36.0)
MCV: 89.9 fL (ref 80.0–100.0)
Monocytes Absolute: 0.4 10*3/uL (ref 0.1–1.0)
Monocytes Relative: 5 %
Neutro Abs: 4.6 10*3/uL (ref 1.7–7.7)
Neutrophils Relative %: 54 %
Platelets: 256 10*3/uL (ref 150–400)
RBC: 4.27 MIL/uL (ref 3.87–5.11)
RDW: 13.4 % (ref 11.5–15.5)
WBC: 8.5 10*3/uL (ref 4.0–10.5)
nRBC: 0 % (ref 0.0–0.2)

## 2021-08-26 LAB — COMPREHENSIVE METABOLIC PANEL
ALT: 16 U/L (ref 0–44)
AST: 24 U/L (ref 15–41)
Albumin: 3.9 g/dL (ref 3.5–5.0)
Alkaline Phosphatase: 95 U/L (ref 38–126)
Anion gap: 13 (ref 5–15)
BUN: 17 mg/dL (ref 8–23)
CO2: 25 mmol/L (ref 22–32)
Calcium: 8.6 mg/dL — ABNORMAL LOW (ref 8.9–10.3)
Chloride: 105 mmol/L (ref 98–111)
Creatinine, Ser: 1.23 mg/dL — ABNORMAL HIGH (ref 0.44–1.00)
GFR, Estimated: 50 mL/min — ABNORMAL LOW (ref >60)
Glucose, Bld: 125 mg/dL — ABNORMAL HIGH (ref 70–99)
Potassium: 3.3 mmol/L — ABNORMAL LOW (ref 3.5–5.1)
Sodium: 143 mmol/L (ref 135–145)
Total Bilirubin: <0.2 mg/dL — ABNORMAL LOW (ref 0.3–1.2)
Total Protein: 7 g/dL (ref 6.5–8.1)
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
## 2021-09-02 ENCOUNTER — Other Ambulatory Visit: Payer: Self-pay | Admitting: Family Medicine

## 2021-09-16 ENCOUNTER — Other Ambulatory Visit: Payer: Self-pay | Admitting: Family Medicine

## 2021-09-17 ENCOUNTER — Encounter (HOSPITAL_BASED_OUTPATIENT_CLINIC_OR_DEPARTMENT_OTHER): Payer: Self-pay | Admitting: Orthopedic Surgery

## 2021-09-27 ENCOUNTER — Encounter (HOSPITAL_BASED_OUTPATIENT_CLINIC_OR_DEPARTMENT_OTHER)
Admission: RE | Admit: 2021-09-27 | Discharge: 2021-09-27 | Disposition: A | Discharge status: Home / Self Care | Payer: 59 | Source: Ambulatory Visit | Attending: Orthopedic Surgery | Admitting: Orthopedic Surgery

## 2021-09-27 DIAGNOSIS — E119 Type 2 diabetes mellitus without complications: Secondary | ICD-10-CM | POA: Diagnosis not present

## 2021-09-27 DIAGNOSIS — F17210 Nicotine dependence, cigarettes, uncomplicated: Secondary | ICD-10-CM | POA: Diagnosis not present

## 2021-09-27 DIAGNOSIS — K219 Gastro-esophageal reflux disease without esophagitis: Secondary | ICD-10-CM | POA: Diagnosis not present

## 2021-09-27 DIAGNOSIS — Z79899 Other long term (current) drug therapy: Secondary | ICD-10-CM | POA: Diagnosis not present

## 2021-09-27 DIAGNOSIS — Z7984 Long term (current) use of oral hypoglycemic drugs: Secondary | ICD-10-CM | POA: Diagnosis not present

## 2021-09-27 DIAGNOSIS — I10 Essential (primary) hypertension: Secondary | ICD-10-CM | POA: Diagnosis not present

## 2021-09-27 DIAGNOSIS — G5601 Carpal tunnel syndrome, right upper limb: Secondary | ICD-10-CM | POA: Diagnosis not present

## 2021-09-27 DIAGNOSIS — Z6841 Body Mass Index (BMI) 40.0 and over, adult: Secondary | ICD-10-CM | POA: Diagnosis not present

## 2021-09-27 LAB — BASIC METABOLIC PANEL
Anion gap: 8 (ref 5–15)
BUN: 12 mg/dL (ref 8–23)
CO2: 30 mmol/L (ref 22–32)
Calcium: 8.7 mg/dL — ABNORMAL LOW (ref 8.9–10.3)
Chloride: 100 mmol/L (ref 98–111)
Creatinine, Ser: 0.9 mg/dL (ref 0.44–1.00)
GFR, Estimated: >60 mL/min (ref >60)
Glucose, Bld: 133 mg/dL — ABNORMAL HIGH (ref 70–99)
Potassium: 3.2 mmol/L — ABNORMAL LOW (ref 3.5–5.1)
Sodium: 138 mmol/L (ref 135–145)

## 2021-09-27 NOTE — Progress Notes (Signed)
Sent text reminding pt to come in for lab work.  

## 2021-09-30 ENCOUNTER — Encounter (HOSPITAL_BASED_OUTPATIENT_CLINIC_OR_DEPARTMENT_OTHER): Payer: Self-pay | Admitting: Orthopedic Surgery

## 2021-09-30 ENCOUNTER — Ambulatory Visit (HOSPITAL_BASED_OUTPATIENT_CLINIC_OR_DEPARTMENT_OTHER): Payer: 59 | Admitting: Anesthesiology

## 2021-09-30 ENCOUNTER — Ambulatory Visit (HOSPITAL_BASED_OUTPATIENT_CLINIC_OR_DEPARTMENT_OTHER)
Admission: RE | Admit: 2021-09-30 | Discharge: 2021-09-30 | Disposition: A | Discharge status: Home / Self Care | Payer: 59 | Attending: Orthopedic Surgery | Admitting: Orthopedic Surgery

## 2021-09-30 ENCOUNTER — Encounter (HOSPITAL_BASED_OUTPATIENT_CLINIC_OR_DEPARTMENT_OTHER)
Admission: RE | Disposition: A | Discharge status: Home / Self Care | Payer: Self-pay | Source: Home / Self Care | Attending: Orthopedic Surgery

## 2021-09-30 ENCOUNTER — Other Ambulatory Visit: Payer: Self-pay

## 2021-09-30 DIAGNOSIS — F17210 Nicotine dependence, cigarettes, uncomplicated: Secondary | ICD-10-CM | POA: Insufficient documentation

## 2021-09-30 DIAGNOSIS — Z6841 Body Mass Index (BMI) 40.0 and over, adult: Secondary | ICD-10-CM | POA: Insufficient documentation

## 2021-09-30 DIAGNOSIS — G5601 Carpal tunnel syndrome, right upper limb: Secondary | ICD-10-CM | POA: Insufficient documentation

## 2021-09-30 DIAGNOSIS — I10 Essential (primary) hypertension: Secondary | ICD-10-CM | POA: Insufficient documentation

## 2021-09-30 DIAGNOSIS — Z79899 Other long term (current) drug therapy: Secondary | ICD-10-CM | POA: Insufficient documentation

## 2021-09-30 DIAGNOSIS — E119 Type 2 diabetes mellitus without complications: Secondary | ICD-10-CM | POA: Insufficient documentation

## 2021-09-30 DIAGNOSIS — Z7984 Long term (current) use of oral hypoglycemic drugs: Secondary | ICD-10-CM | POA: Insufficient documentation

## 2021-09-30 DIAGNOSIS — K219 Gastro-esophageal reflux disease without esophagitis: Secondary | ICD-10-CM | POA: Insufficient documentation

## 2021-09-30 HISTORY — PX: CARPAL TUNNEL RELEASE: SHX101

## 2021-09-30 LAB — GLUCOSE, CAPILLARY
Glucose-Capillary: 141 mg/dL — ABNORMAL HIGH (ref 70–99)
Glucose-Capillary: 126 mg/dL — ABNORMAL HIGH (ref 70–99)

## 2021-09-30 SURGERY — CARPAL TUNNEL RELEASE
Anesthesia: Monitor Anesthesia Care | Site: Wrist | Laterality: Right

## 2021-09-30 MED ORDER — ACETAMINOPHEN 500 MG PO TABS: 1000.0000 mg | ORAL_TABLET | Freq: Once | ORAL | Status: DC | PRN

## 2021-09-30 MED ORDER — FENTANYL CITRATE (PF) 100 MCG/2ML IJ SOLN: 25.0000 ug | INTRAMUSCULAR | Status: DC | PRN

## 2021-09-30 MED ORDER — 0.9 % SODIUM CHLORIDE (POUR BTL) OPTIME
TOPICAL | Status: DC | PRN
  Administered 2021-09-30: 500 mL

## 2021-09-30 MED ORDER — LIDOCAINE 2% (20 MG/ML) 5 ML SYRINGE
INTRAMUSCULAR | Status: DC | PRN
  Administered 2021-09-30: 60 mg via INTRAVENOUS

## 2021-09-30 MED ORDER — LIDOCAINE HCL (PF) 1 % IJ SOLN
INTRAMUSCULAR | Status: AC
  Filled 2021-09-30: qty 30

## 2021-09-30 MED ORDER — OXYCODONE HCL 5 MG/5ML PO SOLN: 5.0000 mg | Freq: Once | ORAL | Status: AC | PRN

## 2021-09-30 MED ORDER — MIDAZOLAM HCL 5 MG/5ML IJ SOLN
INTRAMUSCULAR | Status: DC | PRN
  Administered 2021-09-30: 2 mg via INTRAVENOUS

## 2021-09-30 MED ORDER — OXYCODONE HCL 5 MG PO TABS
ORAL_TABLET | ORAL | Status: AC
  Filled 2021-09-30: qty 1

## 2021-09-30 MED ORDER — ACETAMINOPHEN 160 MG/5ML PO SOLN: 1000.0000 mg | Freq: Once | ORAL | Status: DC | PRN

## 2021-09-30 MED ORDER — ONDANSETRON HCL 4 MG/2ML IJ SOLN
INTRAMUSCULAR | Status: DC | PRN
  Administered 2021-09-30: 4 mg via INTRAVENOUS

## 2021-09-30 MED ORDER — LIDOCAINE HCL (PF) 1 % IJ SOLN
INTRAMUSCULAR | Status: DC | PRN
  Administered 2021-09-30: 10 mL

## 2021-09-30 MED ORDER — LACTATED RINGERS IV SOLN: INTRAVENOUS | Status: DC

## 2021-09-30 MED ORDER — BUPIVACAINE HCL (PF) 0.25 % IJ SOLN
INTRAMUSCULAR | Status: AC
  Filled 2021-09-30: qty 90

## 2021-09-30 MED ORDER — PROPOFOL 500 MG/50ML IV EMUL
INTRAVENOUS | Status: DC | PRN
  Administered 2021-09-30: 50 ug/kg/min via INTRAVENOUS

## 2021-09-30 MED ORDER — LIDOCAINE-EPINEPHRINE (PF) 1 %-1:200000 IJ SOLN
INTRAMUSCULAR | Status: AC
  Filled 2021-09-30: qty 90

## 2021-09-30 MED ORDER — ACETAMINOPHEN 10 MG/ML IV SOLN: 1000.0000 mg | Freq: Once | INTRAVENOUS | Status: DC | PRN

## 2021-09-30 MED ORDER — DEXMEDETOMIDINE (PRECEDEX) IN NS 20 MCG/5ML (4 MCG/ML) IV SYRINGE
PREFILLED_SYRINGE | INTRAVENOUS | Status: DC | PRN
  Administered 2021-09-30: 12 ug via INTRAVENOUS

## 2021-09-30 MED ORDER — FENTANYL CITRATE (PF) 100 MCG/2ML IJ SOLN
INTRAMUSCULAR | Status: AC
  Filled 2021-09-30: qty 2

## 2021-09-30 MED ORDER — FENTANYL CITRATE (PF) 100 MCG/2ML IJ SOLN
INTRAMUSCULAR | Status: DC | PRN
  Administered 2021-09-30: 50 ug via INTRAVENOUS

## 2021-09-30 MED ORDER — OXYCODONE HCL 5 MG PO TABS
5.0000 mg | ORAL_TABLET | Freq: Once | ORAL | Status: AC | PRN
  Administered 2021-09-30: 5 mg via ORAL

## 2021-09-30 MED ORDER — MIDAZOLAM HCL 2 MG/2ML IJ SOLN
INTRAMUSCULAR | Status: AC
  Filled 2021-09-30: qty 2

## 2021-09-30 SURGICAL SUPPLY — 42 items
APL PRP STRL LF DISP 70% ISPRP (MISCELLANEOUS) ×1
BLADE SURG 15 STRL LF DISP TIS (BLADE) ×1 IMPLANT
BLADE SURG 15 STRL SS (BLADE) ×2
BNDG CMPR 9X4 STRL LF SNTH (GAUZE/BANDAGES/DRESSINGS) ×1
BNDG ELASTIC 3X5.8 VLCR STR LF (GAUZE/BANDAGES/DRESSINGS) ×2 IMPLANT
BNDG ESMARK 4X9 LF (GAUZE/BANDAGES/DRESSINGS) ×2 IMPLANT
BNDG GAUZE ELAST 4 BULKY (GAUZE/BANDAGES/DRESSINGS) ×2 IMPLANT
BNDG PLASTER X FAST 3X3 WHT LF (CAST SUPPLIES) IMPLANT
BNDG PLSTR 9X3 FST ST WHT (CAST SUPPLIES)
CHLORAPREP W/TINT 26 (MISCELLANEOUS) ×2 IMPLANT
CORD BIPOLAR FORCEPS 12FT (ELECTRODE) ×2 IMPLANT
COVER BACK TABLE 60X90IN (DRAPES) ×2 IMPLANT
COVER MAYO STAND STRL (DRAPES) ×2 IMPLANT
CUFF TOURN SGL QUICK 18X4 (TOURNIQUET CUFF) IMPLANT
CUFF TOURN SGL QUICK 24 (TOURNIQUET CUFF)
CUFF TRNQT CYL 24X4X16.5-23 (TOURNIQUET CUFF) IMPLANT
DRAPE EXTREMITY T 121X128X90 (DISPOSABLE) ×2 IMPLANT
DRAPE SURG 17X23 STRL (DRAPES) ×2 IMPLANT
GAUZE SPONGE 4X4 12PLY STRL (GAUZE/BANDAGES/DRESSINGS) ×2 IMPLANT
GAUZE XEROFORM 1X8 LF (GAUZE/BANDAGES/DRESSINGS) IMPLANT
GLOVE SURG ENC MOIS LTX SZ7 (GLOVE) ×2 IMPLANT
GLOVE SURG UNDER POLY LF SZ7 (GLOVE) ×2 IMPLANT
GOWN STRL REUS W/ TWL LRG LVL3 (GOWN DISPOSABLE) ×1 IMPLANT
GOWN STRL REUS W/TWL LRG LVL3 (GOWN DISPOSABLE) ×2
GOWN STRL REUS W/TWL XL LVL3 (GOWN DISPOSABLE) ×2 IMPLANT
NDL HYPO 25X1 1.5 SAFETY (NEEDLE) IMPLANT
NEEDLE HYPO 25X1 1.5 SAFETY (NEEDLE) IMPLANT
NS IRRIG 1000ML POUR BTL (IV SOLUTION) ×2 IMPLANT
PACK BASIN DAY SURGERY FS (CUSTOM PROCEDURE TRAY) ×2 IMPLANT
PAD CAST 3X4 CTTN HI CHSV (CAST SUPPLIES) ×1 IMPLANT
PADDING CAST COTTON 3X4 STRL (CAST SUPPLIES) ×2
SLEEVE SCD COMPRESS KNEE MED (STOCKING) IMPLANT
SUCTION FRAZIER HANDLE 10FR (MISCELLANEOUS)
SUCTION TUBE FRAZIER 10FR DISP (MISCELLANEOUS) IMPLANT
SUT ETHILON 4 0 PS 2 18 (SUTURE) ×2 IMPLANT
SUT MNCRL AB 3-0 PS2 18 (SUTURE) IMPLANT
SUT VICRYL 4-0 PS2 18IN ABS (SUTURE) IMPLANT
SYR BULB EAR ULCER 3OZ GRN STR (SYRINGE) ×2 IMPLANT
SYR CONTROL 10ML LL (SYRINGE) IMPLANT
TOWEL GREEN STERILE FF (TOWEL DISPOSABLE) ×4 IMPLANT
TUBE CONNECTING 20X1/4 (TUBING) IMPLANT
UNDERPAD 30X36 HEAVY ABSORB (UNDERPADS AND DIAPERS) ×2 IMPLANT

## 2021-09-30 NOTE — Anesthesia Procedure Notes (Signed)
Procedure Name: MAC Date/Time: 09/30/2021 11:57 AM Performed by: Signe Colt, CRNA Pre-anesthesia Checklist: Patient identified, Emergency Drugs available, Patient being monitored, Timeout performed and Suction available Patient Re-evaluated:Patient Re-evaluated prior to induction Oxygen Delivery Method: Simple face mask

## 2021-09-30 NOTE — Interval H&P Note (Signed)
History and Physical Interval Note:  09/30/2021 11:14 AM  [name]  has presented today for surgery, with the diagnosis of Right Carpal Tunnel Syndrome.  The various methods of treatment have been discussed with the patient and family. After consideration of risks, benefits and other options for treatment, the patient has consented to  Procedure(s): Right CARPAL TUNNEL RELEASE (Right) as a surgical intervention.  The patient's history has been reviewed, patient examined, no change in status, stable for surgery.  I have reviewed the patient's chart and labs.  Questions were answered to the patient's satisfaction.     Letizia Hook Miyoko Hashimi

## 2021-09-30 NOTE — Discharge Instructions (Addendum)
 [name], M.D. Hand Surgery  POST-OPERATIVE DISCHARGE INSTRUCTIONS   PRESCRIPTIONS: - You may have been given a prescription to be taken as directed for post-operative pain control.  You may also take over the counter ibuprofen/aleve and tylenol for pain. Take this as directed on the packaging. Do not exceed 3000 mg tylenol/acetaminophen in 24 hours.  Ibuprofen 600-800 mg (3-4) tablets by mouth every 6 hours as needed for pain.   OR  Aleve 2 tablets by mouth every 12 hours (twice daily) as needed for pain.   AND/OR  Tylenol 1000 mg (2 tablets) every 8 hours as needed for pain.  - Please use your pain medication carefully, as refills are limited and you may not be provided with one.  As stated above, please use over the counter pain medicine - it will also be helpful with decreasing your swelling.    ANESTHESIA: -After your surgery, post-surgical discomfort or pain is likely. This discomfort can last several days to a few weeks. At certain times of the day your discomfort may be more intense.   Did you receive a nerve block?   - A nerve block can provide pain relief for one hour to two days after your surgery. As long as the nerve block is working, you will experience little or no sensation in the area the surgeon operated on.  - As the nerve block wears off, you will begin to experience pain or discomfort. It is very important that you begin taking your prescribed pain medication before the nerve block fully wears off. Treating your pain at the first sign of the block wearing off will ensure your pain is better controlled and more tolerable when full-sensation returns. Do not wait until the pain is intolerable, as the medicine will be less effective. It is better to treat pain in advance than to try and catch up.   General Anesthesia:  If you did not receive a nerve block during your surgery, you will need to start taking your pain medication shortly after your surgery and  should continue to do so as prescribed by your surgeon.     ICE AND ELEVATION: - You may use ice for the first 48-72 hours, but it is not critical.   - Motion of your fingers is very important to decrease the swelling.  - Elevation, as much as possible for the next 48 hours, is critical for decreasing swelling as well as for pain relief. Elevation means when you are seated or lying down, you hand should be at or above your heart. When walking, the hand needs to be at or above the level of your elbow.  - If the bandage gets too tight, it may need to be loosened. Please contact our office and we will instruct you in how to do this.    SURGICAL BANDAGES:  - Keep your dressing and/or splint clean and dry at all times.  You can remove your dressing 4 days from now and change with a dry dressing or Band-Aids as needed thereafter. - You may place a plastic bag over your bandage to shower, but be careful, do not get your bandages wet.  - After the bandages have been removed, it is OK to get the stitches wet in a shower or with hand washing. Do Not soak or submerge the wound yet. Please do not use lotions or creams on the stitches.      HAND THERAPY:  - You may not need any. If you   do, we will begin this at your follow up visit in the clinic.    ACTIVITY AND WORK: - You are encouraged to move any fingers which are not in the bandage.  - Light use of the fingers is allowed to assist the other hand with daily hygiene and eating, but strong gripping or lifting is often uncomfortable and should be avoided.  - You might miss a variable period of time from work and hopefully this issue has been discussed prior to surgery. You may not do any heavy work with your affected hand for about 2 weeks.    [facility]  [address] ,  [place]  [address] [phone]  Post Anesthesia Home Care Instructions  Activity: Get plenty of rest for the remainder of the day. A responsible  individual must stay with you for 24 hours following the procedure.  For the next 24 hours, DO NOT: -Drive a car -Operate machinery -Drink alcoholic beverages -Take any medication unless instructed by your physician -Make any legal decisions or sign important papers.  Meals: Start with liquid foods such as gelatin or soup. Progress to regular foods as tolerated. Avoid greasy, spicy, heavy foods. If nausea and/or vomiting occur, drink only clear liquids until the nausea and/or vomiting subsides. Call your physician if vomiting continues.  Special Instructions/Symptoms: Your throat may feel dry or sore from the anesthesia or the breathing tube placed in your throat during surgery. If this causes discomfort, gargle with warm salt water. The discomfort should disappear within 24 hours.  If you had a scopolamine patch placed behind your ear for the management of post- operative nausea and/or vomiting:  1. The medication in the patch is effective for 72 hours, after which it should be removed.  Wrap patch in a tissue and discard in the trash. Wash hands thoroughly with soap and water. 2. You may remove the patch earlier than 72 hours if you experience unpleasant side effects which may include dry mouth, dizziness or visual disturbances. 3. Avoid touching the patch. Wash your hands with soap and water after contact with the patch.         

## 2021-09-30 NOTE — Op Note (Signed)
° °  Date of Surgery: 09/30/2021  INDICATIONS: Ms. Baranowski is a 61 y.o.-year-old female with right carpal tunnel syndrome that was confirmed by electrodiagnostic testing and has failed conservative management.  Risks, benefits, and alternatives to surgery were again discussed with the patient wishing to proceed with surgery.  Informed consent was signed after our discussion.   PREOPERATIVE DIAGNOSIS: 1. Right carpal tunnel syndrome  POSTOPERATIVE DIAGNOSIS: Same.  PROCEDURE: 1. Right carpal tunnel release   SURGEON: Audria Nine, M.D.  ASSIST:   ANESTHESIA:  Local with MAC, 10 cc 1% plain lidocaine  IV FLUIDS AND URINE: See anesthesia.  ESTIMATED BLOOD LOSS: <5 mL.  IMPLANTS: * No implants in log *   DRAINS: None  COMPLICATIONS: see description of procedure.  DESCRIPTION OF PROCEDURE: The patient was met in the preoperative holding area where the surgical site was marked and the consent form was verified.  The patient was then taken to the operating room and transferred to the operating table.  All bony prominences were well padded.  A tourniquet was applied to the right forearm.  The operative extremity was prepped and draped in the usual and sterile fashion.  A formal time-out was performed to confirm that this was the correct patient, surgery, side, and site.   Following timeout, the limb was exsanguinated and the tourniquet inflated to 250 mmHg.  A longitudinal incision was made in line with the radial border of the ring finger from distal to the wrist flexion crease to the intersection of Kaplan's cardinal line.  The skin and subcutaneous tissue was sharply divided.  The longitudinally running palmar fascia was incised.  The thenar musculature was bluntly swept off of the transverse carpal ligament.  The ligament was divided from proximal to distal until the fat surrounding the palmar arch was encountered.  A retractor was then placed in the proximal aspect of the wound to visualize  the distal antebrachial fascia.  The fascia was sharply divided under direct visualization.   The wound was then thoroughly irrigated with sterile saline.  The tourniquet was deflated.  Hemostasis was achieved with direct pressure and bipolar electrocautery.  The wound was then closed with 4-0 nylon sutures in a horizontal mattress fashion. The wound was then dressed with xeroform, folded kerlix, and an ace wrap.  The patient was then reversed from anesthesia and transferred to the postoperative bed.  All counts were correct x 2 at the end of the procedure.  The patient was taken to the recovery unit in stable condition.    POSTOPERATIVE PLAN: She will be discharged to home with appropriate pain medication and discharge instructions.  I'll see her back in the office in 10-14 days for her first postop visit.   Audria Nine, MD 11:54 AM

## 2021-09-30 NOTE — Anesthesia Preprocedure Evaluation (Signed)
Anesthesia Evaluation  Patient identified by MRN, date of birth, ID band Patient awake    Reviewed: Allergy & Precautions, NPO status , Patient's Chart, lab work & pertinent test results  History of Anesthesia Complications Negative for: history of anesthetic complications  Airway Mallampati: IV  TM Distance: >3 FB Neck ROM: Full    Dental  (+) Dental Advisory Given, Teeth Intact   Pulmonary neg shortness of breath, neg COPD, neg recent URI, Current Smoker and Patient abstained from smoking.,    breath sounds clear to auscultation       Cardiovascular hypertension, Pt. on medications (-) angina(-) dysrhythmias  Rhythm:Regular     Neuro/Psych  Headaches,  Neuromuscular disease negative psych ROS   GI/Hepatic Neg liver ROS, GERD  Medicated and Controlled,  Endo/Other  diabetes, Type 2Morbid obesity  Renal/GU negative Renal ROS     Musculoskeletal  (+) Arthritis ,   Abdominal   Peds  Hematology negative hematology ROS (+)   Anesthesia Other Findings   Reproductive/Obstetrics                             Anesthesia Physical Anesthesia Plan  ASA: 3  Anesthesia Plan: MAC   Post-op Pain Management:    Induction:   PONV Risk Score and Plan: 1 and Treatment may vary due to age or medical condition and Ondansetron  Airway Management Planned: Nasal Cannula  Additional Equipment: None  Intra-op Plan:   Post-operative Plan:   Informed Consent: I have reviewed the patients History and Physical, chart, labs and discussed the procedure including the risks, benefits and alternatives for the proposed anesthesia with the patient or authorized representative who has indicated his/her understanding and acceptance.     Dental advisory given  Plan Discussed with: CRNA and Anesthesiologist  Anesthesia Plan Comments:         Anesthesia Quick Evaluation

## 2021-09-30 NOTE — H&P (Signed)
HPI: [name] is a 61 y.o. female who presents with right carpal tunnel syndrome that has failed conservative management.  EMG/NCS was also consistent with CTS.  She presents today for carpal tunnel release.   Past Medical History:  Diagnosis Date   Breast cancer ([place]) 2017   right breast   Breast cancer of lower-inner quadrant of right female breast ([place]) 08/08/2015   Colon polyps    DDD (degenerative disc disease), cervical    Degenerative joint disease    Diabetes mellitus without complication ([place])    Diverticulosis    Gallstones    GERD (gastroesophageal reflux disease)    Headache(784.0)    migraine like per patient   Hypertension    Low back pain    dr Hardin Negus, pain management   Osteoarthritis    Personal history of chemotherapy 2017   Personal history of radiation therapy 2017   Past Surgical History:  Procedure Laterality Date   CHOLECYSTECTOMY  10/28/2011   Procedure: LAPAROSCOPIC CHOLECYSTECTOMY WITH INTRAOPERATIVE CHOLANGIOGRAM;  Surgeon: Earnstine Regal, MD;  Location: [facility];  Service: General;  Laterality: N/A;   COLONOSCOPY WITH ESOPHAGOGASTRODUODENOSCOPY (EGD)  04/13/2018   per Dr. Henrene Pastor, adenomatous polyp, repeat in 5 yrs    EXAM UNDER ANESTHESIA WITH MANIPULATION OF KNEE Right 05/30/2003   EXAM UNDER ANESTHESIA WITH MANIPULATION OF KNEE Left 12/27/2002   GANGLION CYST EXCISION Right    right wrist   KNEE ARTHROSCOPY Bilateral 01/02/2000   KNEE ARTHROSCOPY Right    LAPAROSCOPIC VAGINAL HYSTERECTOMY WITH SALPINGO OOPHORECTOMY Bilateral 07/13/2006   LIPOMA EXCISION Right 02/21/2008   hip   MASS EXCISION  08/24/2012   Procedure: EXCISION MASS;  Surgeon: Earnstine Regal, MD;  Location: [facility];  Service: General;  Laterality: Left;  excisie soft tissue masses left shoulder(back) & left upper arm   MASS EXCISION Left 01/24/2014   Procedure: EXCISION SOFT TISSUE MASSES LOWER LEFT BACK;  Surgeon: Earnstine Regal, MD;  Location: [facility];  Service: General;  Laterality: Left;   MASS EXCISION Right 04/30/2016   Procedure: EXCISION OF SKIN RIGHT CHEST WALL;  Surgeon: Autumn Messing III, MD;  Location: [facility];  Service: General;  Laterality: Right;  EXCISION OF SKIN RIGHT CHEST WALL   MASTECTOMY Right 2017   MASTECTOMY W/ SENTINEL NODE BIOPSY Right 10/01/2015   Procedure: RIGHT MASTECTOMY WITH SENTINEL LYMPH NODE BIOPSY;  Surgeon: Autumn Messing III, MD;  Location: [facility];  Service: General;  Laterality: Right;   PORT-A-CATH REMOVAL Left 04/30/2016   Procedure: REMOVAL PORT-A-CATH;  Surgeon: Autumn Messing III, MD;  Location: [facility];  Service: General;  Laterality: Left;  REMOVAL PORT-A-CATH   PORTACATH PLACEMENT Left 11/01/2015   Procedure: INSERTION PORT-A-CATH;  Surgeon: Autumn Messing III, MD;  Location: [facility];  Service: General;  Laterality: Left;   REPLACEMENT UNICONDYLAR JOINT KNEE Left 04/20/2001   REVISION TOTAL KNEE ARTHROPLASTY Right 06/18/2004   REVISION TOTAL KNEE ARTHROPLASTY Left 11/15/2002   REVISION TOTAL KNEE ARTHROPLASTY Left 10/12/2001   REVISION TOTAL KNEE ARTHROPLASTY Right 07/09/2015   TONSILLECTOMY     TOTAL KNEE ARTHROPLASTY Right 04/25/2003   TOTAL KNEE ARTHROPLASTY Left 06/25/2009   Social History   Socioeconomic History   Marital status: Married    Spouse name: Not on file   Number of children: 3   Years of education: Not on file   Highest education level: Not on file  Occupational History  Occupation: retired/disabled  Tobacco Use   Smoking status: Some Days    Packs/day: 0.25    Years: 20.00    Pack years: 5.00    Types: Cigarettes    Last attempt to quit: 05/08/2020    Years since quitting: 1.3   Smokeless tobacco: Former    Quit date: 09/30/2015   Tobacco comments:    smokes about 4 cigs/day; working on quitting - 08/22/16 gwd  Vaping Use   Vaping Use: Never used  Substance and Sexual Activity   Alcohol use: No    Alcohol/week: 0.0  standard drinks   Drug use: No   Sexual activity: Yes    Birth control/protection: Post-menopausal  Other Topics Concern   Not on file  Social History Narrative   Not on file   Social Determinants of Health   Financial Resource Strain: Not on file  Food Insecurity: Not on file  Transportation Needs: Not on file  Physical Activity: Not on file  Stress: Not on file  Social Connections: Not on file   Family History  Problem Relation Age of Onset   Stomach cancer Mother    Heart disease Father    Prostate cancer Father    Kidney cancer Sister        one out of the four   Hypertension Sister    Hypertension Brother    Crohn's disease Other        nephew   Colon cancer Maternal Grandmother    Esophageal cancer Neg Hx    - negative except otherwise stated in the family history section Allergies  Allergen Reactions   Lisinopril Shortness Of Breath and Swelling    Angioedema   Codeine Itching, Nausea And Vomiting and Other (See Comments)    Itching all over the body   Latex Hives   Sulfonamide Derivatives Hives and Other (See Comments)    All over the body   Gabapentin Other (See Comments)    headache   Lyrica [Pregabalin] Other (See Comments)    headache   Prior to Admission medications   Medication Sig Start Date End Date Taking? Authorizing Provider  amitriptyline (ELAVIL) 100 MG tablet Take 150 mg by mouth at bedtime.  05/24/19  Yes [provider]  amLODipine (NORVASC) 10 MG tablet TAKE 1 TABLET BY MOUTH EVERY DAY 09/17/21  Yes Laurey Morale, MD  metFORMIN (GLUCOPHAGE) 500 MG tablet TAKE 1 TABLET BY MOUTH TWICE A DAY WITH MEALS 06/11/21  Yes Laurey Morale, MD  methadone (DOLOPHINE) 10 MG tablet Take 10 mg by mouth every 8 (eight) hours.  05/24/19  Yes [provider]  methocarbamol (ROBAXIN) 750 MG tablet Take by mouth. 07/20/20  Yes [provider]  metoprolol tartrate (LOPRESSOR) 50 MG tablet TAKE 1 TABLET BY MOUTH TWICE A DAY 09/17/21  Yes  Laurey Morale, MD  omeprazole (PRILOSEC) 40 MG capsule TAKE 1 CAPSULE BY MOUTH EVERY DAY 08/12/21  Yes Laurey Morale, MD  Oxycodone HCl 10 MG TABS Take 10 mg by mouth every 6 (six) hours as needed (for pain).  07/22/19  Yes [provider]  potassium chloride SA (KLOR-CON M20) 20 MEQ tablet TAKE 1 TABLET BY MOUTH EVERY DAY 09/17/21  Yes Laurey Morale, MD  zolpidem (AMBIEN) 10 MG tablet TAKE 2 TABLETS (20 MG TOTAL) BY MOUTH AT BEDTIME. INS ONLY PAYS ONE DAILY 04/01/21  Yes Laurey Morale, MD  diclofenac Sodium (VOLTAREN) 1 % GEL Apply 4 g topically 4 (four) times  daily. 07/05/21   [name], MD  ondansetron (ZOFRAN) 8 MG tablet TAKE 1 TABLET BY MOUTH EVERY 6 HOURS AS NEEDED FOR NAUSEA OR VOMITING 08/12/21   Laurey Morale, MD  OneTouch Delica Lancets 46X MISC USE TO TEST ONCE DAILY DX CODE E11.9 05/11/20   Laurey Morale, MD  [facility] ULTRA test strip USE TO TEST ONCE DAILY 09/02/21   Laurey Morale, MD  tamoxifen (NOLVADEX) 20 MG tablet Take 1 tablet (20 mg total) by mouth daily. 11/22/20   [name], Virgie Dad, MD  triamcinolone cream (KENALOG) 0.1 % Apply 1 application topically 2 (two) times daily. 06/15/21   Teodora Medici, FNP  lisinopril (ZESTRIL) 10 MG tablet TAKE 1 TABLET BY MOUTH EVERY DAY Patient taking differently: Take 10 mg by mouth daily.  02/02/20 04/12/20  Laurey Morale, MD   No results found. - pertinent xrays, CT, MRI studies were reviewed and independently interpreted  Positive ROS: All other systems have been reviewed and were otherwise negative with the exception of those mentioned in the HPI and as above.  Physical Exam: General: No acute distress, resting comfortably Cardiovascular: BUE warm and well perfused Respiratory: No cyanosis, no use of accessory musculature Skin: No lesions in the area of chief complaint Neurologic: Sensation intact distally Psychiatric: Patient is at baseline mood and affect  Right hand: + Tinel, CT compression, and Phalen Hand  warm and well perfused SILT throughout    Assessment: 61 yo F w/ R CTS that has failed conservative management  Plan: OR today for open CTR   Sherilyn Cooter, M.D. [facility] 11:12 AM

## 2021-09-30 NOTE — Transfer of Care (Signed)
Immediate Anesthesia Transfer of Care Note  Patient: [name]  Procedure(s) Performed: Right CARPAL TUNNEL RELEASE (Right: Wrist)  Patient Location: PACU  Anesthesia Type:MAC  Level of Consciousness: awake  Airway & Oxygen Therapy: Patient Spontanous Breathing and Patient connected to face mask oxygen  Post-op Assessment: Report given to RN and Post -op Vital signs reviewed and stable  Post vital signs: Reviewed and stable  Last Vitals:  Vitals Value Taken Time  BP    Temp    Pulse 65 09/30/21 1155  Resp    SpO2 96 % 09/30/21 1155  Vitals shown include unvalidated device data.  Last Pain:  Vitals:   09/30/21 1023  TempSrc: Oral  PainSc: 0-No pain         Complications: No notable events documented.

## 2021-09-30 NOTE — Brief Op Note (Signed)
09/30/2021  11:53 AM  PATIENT:  Wynona Luna  61 y.o. female  PRE-OPERATIVE DIAGNOSIS:  Right Carpal Tunnel Syndrome  POST-OPERATIVE DIAGNOSIS:  Right Carpal Tunnel Syndrome  PROCEDURE:  Procedure(s): Right CARPAL TUNNEL RELEASE (Right)  SURGEON:  Surgeon(s) and Role:    * Sherilyn Cooter, MD - Primary  PHYSICIAN ASSISTANT:   ASSISTANTS: none   ANESTHESIA:   local and MAC  EBL:  <5 cc   BLOOD ADMINISTERED:none  DRAINS: none   LOCAL MEDICATIONS USED:  LIDOCAINE   SPECIMEN:  No Specimen  DISPOSITION OF SPECIMEN:  N/A  COUNTS:  YES  TOURNIQUET:   Total Tourniquet Time Documented: Forearm (Right) - 11 minutes Total: Forearm (Right) - 11 minutes   DICTATION: .Dragon Dictation  PLAN OF CARE: Discharge to home after PACU  PATIENT DISPOSITION:  PACU - hemodynamically stable.   Delay start of Pharmacological VTE agent (>24hrs) due to surgical blood loss or risk of bleeding: not applicable

## 2021-10-01 ENCOUNTER — Encounter (HOSPITAL_BASED_OUTPATIENT_CLINIC_OR_DEPARTMENT_OTHER): Payer: Self-pay | Admitting: Orthopedic Surgery

## 2021-10-01 NOTE — Anesthesia Postprocedure Evaluation (Signed)
Anesthesia Post Note  Patient: [name]  Procedure(s) Performed: Right CARPAL TUNNEL RELEASE (Right: Wrist)     Patient location during evaluation: PACU Anesthesia Type: MAC Level of consciousness: awake and alert Pain management: pain level controlled Vital Signs Assessment: post-procedure vital signs reviewed and stable Respiratory status: spontaneous breathing, nonlabored ventilation and respiratory function stable Cardiovascular status: blood pressure returned to baseline and stable Postop Assessment: no apparent nausea or vomiting Anesthetic complications: no   No notable events documented.  Last Vitals:  Vitals:   09/30/21 1230 09/30/21 1300  BP: 112/79 124/85  Pulse: 70 72  Resp: 17 18  Temp:  36.7 C  SpO2: 94% 94%    Last Pain:  Vitals:   09/30/21 1239  TempSrc:   PainSc: [place]

## 2021-10-14 ENCOUNTER — Ambulatory Visit (INDEPENDENT_AMBULATORY_CARE_PROVIDER_SITE_OTHER): Payer: 59 | Admitting: Orthopedic Surgery

## 2021-10-14 ENCOUNTER — Other Ambulatory Visit: Payer: Self-pay

## 2021-10-14 DIAGNOSIS — G5603 Carpal tunnel syndrome, bilateral upper limbs: Secondary | ICD-10-CM

## 2021-10-14 NOTE — Progress Notes (Signed)
Post-Op Visit Note   Patient: [name]           Date of Birth: May 27, 1961           MRN: [id number] Visit Date: 10/14/2021 PCP: Laurey Morale, MD   Assessment & Plan:  Chief Complaint:  Chief Complaint  Patient presents with   Right Hand - Routine Post Op    Right CTR on 09/30/21   Visit Diagnoses:  1. Carpal tunnel syndrome, bilateral     Plan: Patient is now two weeks s/p right carpal tunnel release.  She is doing well postoperatively.  Her numbness is improved.  She's no longer having nocturnal symptoms.  Sutures were removed today.  We discussed scar massage and finger ROM exercises.  Follow-Up Instructions: No follow-ups on file.   Orders:  No orders of the defined types were placed in this encounter.  No orders of the defined types were placed in this encounter.   Imaging: No results found.  PMFS History: Patient Active Problem List   Diagnosis Date Noted   Carpal tunnel syndrome, bilateral 08/23/2021   Neuropathy 10/19/2020   DDD (degenerative disc disease), lumbar 07/16/2018    Class: Chronic   Other spondylosis with radiculopathy, lumbar region 07/16/2018    Class: Chronic   S/P lumbar fusion 07/13/2018   Tenosynovitis, de Quervain 08/10/2017   Multiple lipomas 07/15/2017   Insomnia 07/16/2016   Left groin pain 11/30/2015   Morbid obesity with BMI of 40.0-44.9, adult ([place]) 10/26/2015   Malignant neoplasm of lower-inner quadrant of right breast of female, estrogen receptor positive ([place]) 08/08/2015   S/P revision of total knee 07/24/2015   Acute postoperative pain 07/11/2015   Loose total knee arthroplasty ([place]) 07/02/2015   Type 2 diabetes mellitus without complications ([place]) 78/93/8101   Adhesive capsulitis of left shoulder 10/11/2014   Paraspinous mass, left 03/31/2013   Other symptoms and signs involving the nervous system 03/31/2013   Internal hemorrhoid 02/17/2013   Abdominal pain, other specified site 02/17/2013   Neoplasm of soft  tissue, left shoulder and left upper arm 08/03/2012   Lightheadedness 01/29/2012   Cholelithiasis with cholecystitis 10/08/2011   Hypokalemia 09/17/2011   Gastro-esophageal reflux disease without esophagitis 08/08/2010   Gout 12/18/2008   Essential (primary) hypertension 03/15/2007   OA (osteoarthritis) of knee 03/15/2007   HEADACHE 03/15/2007   Past Medical History:  Diagnosis Date   Breast cancer ([place]) 2017   right breast   Breast cancer of lower-inner quadrant of right female breast ([place]) 08/08/2015   Colon polyps    DDD (degenerative disc disease), cervical    Degenerative joint disease    Diabetes mellitus without complication (HCC)    Diverticulosis    Gallstones    GERD (gastroesophageal reflux disease)    Headache(784.0)    migraine like per patient   Hypertension    Low back pain    dr Hardin Negus, pain management   Osteoarthritis    Personal history of chemotherapy 2017   Personal history of radiation therapy 2017    Family History  Problem Relation Age of Onset   Stomach cancer Mother    Heart disease Father    Prostate cancer Father    Kidney cancer Sister        one out of the four   Hypertension Sister    Hypertension Brother    Crohn's disease Other        nephew   Colon cancer Maternal Grandmother    Esophageal  cancer Neg Hx     Past Surgical History:  Procedure Laterality Date   CARPAL TUNNEL RELEASE Right 09/30/2021   Procedure: Right CARPAL TUNNEL RELEASE;  Surgeon: Sherilyn Cooter, MD;  Location: [facility];  Service: Orthopedics;  Laterality: Right;   CHOLECYSTECTOMY  10/28/2011   Procedure: LAPAROSCOPIC CHOLECYSTECTOMY WITH INTRAOPERATIVE CHOLANGIOGRAM;  Surgeon: Earnstine Regal, MD;  Location: [facility];  Service: General;  Laterality: N/A;   COLONOSCOPY WITH ESOPHAGOGASTRODUODENOSCOPY (EGD)  04/13/2018   per Dr. Henrene Pastor, adenomatous polyp, repeat in 5 yrs    EXAM UNDER ANESTHESIA WITH MANIPULATION OF KNEE Right 05/30/2003   EXAM UNDER  ANESTHESIA WITH MANIPULATION OF KNEE Left 12/27/2002   GANGLION CYST EXCISION Right    right wrist   KNEE ARTHROSCOPY Bilateral 01/02/2000   KNEE ARTHROSCOPY Right    LAPAROSCOPIC VAGINAL HYSTERECTOMY WITH SALPINGO OOPHORECTOMY Bilateral 07/13/2006   LIPOMA EXCISION Right 02/21/2008   hip   MASS EXCISION  08/24/2012   Procedure: EXCISION MASS;  Surgeon: Earnstine Regal, MD;  Location: [facility];  Service: General;  Laterality: Left;  excisie soft tissue masses left shoulder(back) & left upper arm   MASS EXCISION Left 01/24/2014   Procedure: EXCISION SOFT TISSUE MASSES LOWER LEFT BACK;  Surgeon: Earnstine Regal, MD;  Location: [facility];  Service: General;  Laterality: Left;   MASS EXCISION Right 04/30/2016   Procedure: EXCISION OF SKIN RIGHT CHEST WALL;  Surgeon: Autumn Messing III, MD;  Location: [facility];  Service: General;  Laterality: Right;  EXCISION OF SKIN RIGHT CHEST WALL   MASTECTOMY Right 2017   MASTECTOMY W/ SENTINEL NODE BIOPSY Right 10/01/2015   Procedure: RIGHT MASTECTOMY WITH SENTINEL LYMPH NODE BIOPSY;  Surgeon: Autumn Messing III, MD;  Location: [facility];  Service: General;  Laterality: Right;   PORT-A-CATH REMOVAL Left 04/30/2016   Procedure: REMOVAL PORT-A-CATH;  Surgeon: Autumn Messing III, MD;  Location: [facility];  Service: General;  Laterality: Left;  REMOVAL PORT-A-CATH   PORTACATH PLACEMENT Left 11/01/2015   Procedure: INSERTION PORT-A-CATH;  Surgeon: Autumn Messing III, MD;  Location: [facility];  Service: General;  Laterality: Left;   REPLACEMENT UNICONDYLAR JOINT KNEE Left 04/20/2001   REVISION TOTAL KNEE ARTHROPLASTY Right 06/18/2004   REVISION TOTAL KNEE ARTHROPLASTY Left 11/15/2002   REVISION TOTAL KNEE ARTHROPLASTY Left 10/12/2001   REVISION TOTAL KNEE ARTHROPLASTY Right 07/09/2015   TONSILLECTOMY     TOTAL KNEE ARTHROPLASTY Right 04/25/2003   TOTAL KNEE ARTHROPLASTY Left 06/25/2009   Social History    Occupational History   Occupation: retired/disabled  Tobacco Use   Smoking status: Some Days    Packs/day: 0.25    Years: 20.00    Pack years: 5.00    Types: Cigarettes    Last attempt to quit: 05/08/2020    Years since quitting: 1.4   Smokeless tobacco: Former    Quit date: 09/30/2015   Tobacco comments:    smokes about 4 cigs/day; working on quitting - 08/22/16 gwd  Vaping Use   Vaping Use: Never used  Substance and Sexual Activity   Alcohol use: No    Alcohol/week: 0.0 standard drinks   Drug use: No   Sexual activity: Yes    Birth control/protection: Post-menopausal

## 2021-10-16 ENCOUNTER — Other Ambulatory Visit: Payer: Self-pay | Admitting: Family Medicine

## 2021-10-17 ENCOUNTER — Other Ambulatory Visit: Payer: Self-pay | Admitting: Family Medicine

## 2021-10-17 NOTE — Telephone Encounter (Signed)
Last VV- 04/02/21 Last refill- 04/01/21--30 tabs, 5 refills  No future OV scheduled.  Can this patient receive a refill?

## 2021-10-20 ENCOUNTER — Other Ambulatory Visit: Payer: Self-pay | Admitting: Family Medicine

## 2021-11-01 ENCOUNTER — Other Ambulatory Visit: Payer: Self-pay | Admitting: Family Medicine

## 2021-11-01 DIAGNOSIS — I10 Essential (primary) hypertension: Secondary | ICD-10-CM

## 2021-11-05 ENCOUNTER — Other Ambulatory Visit: Payer: Self-pay | Admitting: Family Medicine

## 2021-11-13 ENCOUNTER — Other Ambulatory Visit: Payer: Self-pay | Admitting: Family Medicine

## 2021-11-13 DIAGNOSIS — I10 Essential (primary) hypertension: Secondary | ICD-10-CM

## 2021-12-09 ENCOUNTER — Other Ambulatory Visit: Payer: Self-pay | Admitting: Family Medicine

## 2021-12-09 DIAGNOSIS — I10 Essential (primary) hypertension: Secondary | ICD-10-CM

## 2022-01-06 ENCOUNTER — Other Ambulatory Visit: Payer: Self-pay | Admitting: Family Medicine

## 2022-01-06 DIAGNOSIS — I10 Essential (primary) hypertension: Secondary | ICD-10-CM

## 2022-01-06 NOTE — Telephone Encounter (Signed)
Pt needs office visit for further refills 

## 2022-01-14 ENCOUNTER — Other Ambulatory Visit: Payer: Self-pay | Admitting: Family Medicine

## 2022-01-14 DIAGNOSIS — I10 Essential (primary) hypertension: Secondary | ICD-10-CM

## 2022-01-15 ENCOUNTER — Other Ambulatory Visit: Payer: Self-pay | Admitting: Family Medicine

## 2022-01-15 DIAGNOSIS — Z1231 Encounter for screening mammogram for malignant neoplasm of breast: Secondary | ICD-10-CM

## 2022-01-23 ENCOUNTER — Other Ambulatory Visit (HOSPITAL_COMMUNITY): Payer: Self-pay

## 2022-01-24 ENCOUNTER — Other Ambulatory Visit (HOSPITAL_COMMUNITY): Payer: Self-pay

## 2022-01-24 ENCOUNTER — Other Ambulatory Visit: Payer: Self-pay | Admitting: Family Medicine

## 2022-01-24 DIAGNOSIS — I10 Essential (primary) hypertension: Secondary | ICD-10-CM

## 2022-01-24 MED ORDER — OXYCODONE HCL 10 MG PO TABS
ORAL_TABLET | ORAL | 0 refills | Status: AC
  Filled 2022-01-24: qty 120, 30d supply, fill #0

## 2022-01-30 ENCOUNTER — Other Ambulatory Visit (HOSPITAL_COMMUNITY): Payer: Self-pay | Admitting: Anesthesiology

## 2022-01-30 ENCOUNTER — Ambulatory Visit (HOSPITAL_COMMUNITY)
Admission: RE | Admit: 2022-01-30 | Discharge: 2022-01-30 | Disposition: A | Discharge status: Home / Self Care | Payer: 59 | Source: Ambulatory Visit | Attending: Anesthesiology | Admitting: Anesthesiology

## 2022-01-30 DIAGNOSIS — M25552 Pain in left hip: Secondary | ICD-10-CM | POA: Insufficient documentation

## 2022-02-10 ENCOUNTER — Ambulatory Visit
Admission: RE | Admit: 2022-02-10 | Discharge: 2022-02-10 | Disposition: A | Discharge status: Home / Self Care | Payer: 59 | Source: Ambulatory Visit | Attending: Family Medicine | Admitting: Family Medicine

## 2022-02-10 DIAGNOSIS — Z1231 Encounter for screening mammogram for malignant neoplasm of breast: Secondary | ICD-10-CM

## 2022-02-15 ENCOUNTER — Other Ambulatory Visit: Payer: Self-pay | Admitting: Family Medicine

## 2022-02-16 ENCOUNTER — Other Ambulatory Visit: Payer: Self-pay | Admitting: Family Medicine

## 2022-02-16 DIAGNOSIS — I10 Essential (primary) hypertension: Secondary | ICD-10-CM

## 2022-02-18 NOTE — Telephone Encounter (Signed)
Pt need appointment for further refills 

## 2022-02-23 ENCOUNTER — Other Ambulatory Visit: Payer: Self-pay

## 2022-02-23 ENCOUNTER — Emergency Department (HOSPITAL_BASED_OUTPATIENT_CLINIC_OR_DEPARTMENT_OTHER)
Admission: EM | Admit: 2022-02-23 | Discharge: 2022-02-24 | Disposition: A | Discharge status: Home / Self Care | Payer: 59 | Source: Home / Self Care

## 2022-02-23 ENCOUNTER — Emergency Department (HOSPITAL_COMMUNITY)
Admission: EM | Admit: 2022-02-23 | Discharge: 2022-02-23 | Discharge status: Against Medical Advice | Payer: 59 | Attending: Emergency Medicine | Admitting: Emergency Medicine

## 2022-02-23 ENCOUNTER — Encounter (HOSPITAL_BASED_OUTPATIENT_CLINIC_OR_DEPARTMENT_OTHER): Payer: Self-pay

## 2022-02-23 ENCOUNTER — Encounter (HOSPITAL_COMMUNITY): Payer: Self-pay | Admitting: Emergency Medicine

## 2022-02-23 DIAGNOSIS — M25551 Pain in right hip: Secondary | ICD-10-CM | POA: Insufficient documentation

## 2022-02-23 DIAGNOSIS — M545 Low back pain, unspecified: Secondary | ICD-10-CM | POA: Insufficient documentation

## 2022-02-23 DIAGNOSIS — Z5321 Procedure and treatment not carried out due to patient leaving prior to being seen by health care provider: Secondary | ICD-10-CM | POA: Insufficient documentation

## 2022-02-23 NOTE — ED Triage Notes (Signed)
C/o R hip pain that radiates down R leg since Friday.  Denies injury.  Ambulates with cane.

## 2022-02-23 NOTE — ED Triage Notes (Signed)
Pt presents POV for Right lower back pain radiating down into her Right leg since Friday.

## 2022-02-23 NOTE — ED Notes (Signed)
Pt stated she couldn't sit or lay for long periods of time to wait to be called back. Pt left building after being triaged.

## 2022-02-24 ENCOUNTER — Other Ambulatory Visit: Payer: Self-pay | Admitting: Family Medicine

## 2022-02-24 ENCOUNTER — Ambulatory Visit: Payer: 59 | Admitting: Family Medicine

## 2022-02-24 ENCOUNTER — Encounter: Payer: Self-pay | Admitting: Family Medicine

## 2022-02-24 VITALS — BP 124/80 | HR 85 | Temp 98.6°F | Wt 270.0 lb

## 2022-02-24 DIAGNOSIS — M5441 Lumbago with sciatica, right side: Secondary | ICD-10-CM | POA: Diagnosis not present

## 2022-02-24 DIAGNOSIS — I10 Essential (primary) hypertension: Secondary | ICD-10-CM | POA: Diagnosis not present

## 2022-02-24 LAB — POC URINALSYSI DIPSTICK (AUTOMATED)
Bilirubin, UA: NEGATIVE
Glucose, UA: NEGATIVE
Ketones, UA: NEGATIVE
Leukocytes, UA: NEGATIVE
Nitrite, UA: NEGATIVE
Protein, UA: POSITIVE — AB
Spec Grav, UA: 1.02 (ref 1.010–1.025)
Urobilinogen, UA: 0.2 U/dL
pH, UA: 6 (ref 5.0–8.0)

## 2022-02-24 MED ORDER — METFORMIN HCL 500 MG PO TABS: 500.0000 mg | ORAL_TABLET | Freq: Two times a day (BID) | ORAL | 3 refills | Status: DC

## 2022-02-24 MED ORDER — METHYLPREDNISOLONE ACETATE 80 MG/ML IJ SUSP
80.0000 mg | Freq: Once | INTRAMUSCULAR | Status: AC
  Administered 2022-02-24: 80 mg via INTRAMUSCULAR

## 2022-02-24 MED ORDER — METOPROLOL TARTRATE 50 MG PO TABS: 50.0000 mg | ORAL_TABLET | Freq: Two times a day (BID) | ORAL | 3 refills | Status: DC

## 2022-02-24 MED ORDER — METHYLPREDNISOLONE 4 MG PO TBPK: ORAL_TABLET | ORAL | 0 refills | Status: DC

## 2022-02-24 NOTE — Addendum Note (Signed)
Addended by: Wyvonne Lenz on: 02/24/2022 05:17 PM   Modules accepted: Orders

## 2022-02-24 NOTE — Progress Notes (Signed)
   Subjective:    Patient ID: [name], female    DOB: Jul 15, 1961, 61 y.o.   MRN: [id number]  HPI Here for severe pain in the right lower back that radiates through the right buttock and down the posterior right leg. 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 She is already taking Oxycodone and Methocarbamol. She has been applying moist heat.    Review of Systems  Constitutional: Negative.   Respiratory: Negative.    Cardiovascular: Negative.   Musculoskeletal:  Positive for back pain.      Objective:   Physical Exam Constitutional:      Comments: In obvious pain, limping and using a cane   Cardiovascular:     Rate and Rhythm: Normal rate and regular rhythm.     Pulses: Normal pulses.     Heart sounds: Normal heart sounds.  Pulmonary:     Effort: Pulmonary effort is normal.     Breath sounds: Normal breath sounds.  Musculoskeletal:     Comments: She is very tender over the right lower back and the right sciatic notch. ROM is limited by pain. SLR is positive on the right.   Neurological:     Mental Status: She is alert.          Assessment & Plan:  Right sided low back pain with sciatica. She is given a shot of DepoMedrol and we will follow this with a Medrol dose pack. Recheck as needed.  Alysia Penna, MD

## 2022-03-19 ENCOUNTER — Ambulatory Visit: Payer: Self-pay

## 2022-03-19 ENCOUNTER — Encounter: Payer: Self-pay | Admitting: Surgery

## 2022-03-19 ENCOUNTER — Ambulatory Visit: Payer: 59 | Admitting: Surgery

## 2022-03-19 DIAGNOSIS — M545 Low back pain, unspecified: Secondary | ICD-10-CM

## 2022-03-19 DIAGNOSIS — Z981 Arthrodesis status: Secondary | ICD-10-CM

## 2022-03-19 DIAGNOSIS — M5136 Other intervertebral disc degeneration, lumbar region: Secondary | ICD-10-CM

## 2022-03-19 NOTE — Progress Notes (Signed)
Office Visit Note   Patient: [name]           Date of Birth: 1961/01/31           MRN: [id number] Visit Date: 03/19/2022              Requested by: Laurey Morale, MD [place],  [place] [id number] PCP: Laurey Morale, MD   Assessment & Plan: Visit Diagnoses:  1. Acute right-sided low back pain, unspecified whether sciatica present   2. DDD (degenerative disc disease), lumbar   3. S/P lumbar fusion     Plan: With patient's ongoing symptoms that have failed conservative treatment I will order lumbar MRI with and without contrast.  Follow with Dr. Louanne Skye in 3 weeks for recheck and to discuss results and further treatment options.  All questions answered.  Follow-Up Instructions: Return in about 3 weeks (around 04/09/2022) for WITH DR NITKA TO REVIEW LUMBAR MRI.   Orders:  Orders Placed This Encounter  Procedures   XR Lumbar Spine 2-3 Views   XR HIP UNILAT W OR W/O PELVIS 2-3 VIEWS RIGHT   MR Lumbar Spine W Wo Contrast   No orders of the defined types were placed in this encounter.     Procedures: No procedures performed   Clinical Data: No additional findings.   Subjective: Chief Complaint  Patient presents with   Lower Back - Follow-up    HPI 62 year old black female comes in with complaints of low back pain and right lower extremity radiculopathy.  She is status post L4-S1 fusion by Dr. Louanne Skye October 2019.  She was seen by her PCP June for 2023 and given Depo-Medrol IM injection and prescribed Medrol dose pack taper for current symptoms.  States that she had minimal temporary improvement.  Complains of right-sided low back pain that radiates down the right leg.  Nothing on the left side.  Ambulating with a cane. Review of Systems No current cardiopulmonary GI/GU issue  Objective: Vital Signs: There were no vitals taken for this visit.  Physical Exam HENT:     Nose: Nose normal.  Eyes:     Extraocular Movements: Extraocular  movements intact.  Pulmonary:     Effort: Pulmonary effort is normal. No respiratory distress.  Musculoskeletal:     Comments: Gait is antalgic with a single prong cane.  Bilateral lumbar paraspinal tenderness/spasm.  Negative log roll bilateral hips.  Positive right straight leg raise.  Neurological:     Mental Status: She is alert and oriented to person, place, and time.  Psychiatric:        Mood and Affect: Mood normal.     Ortho Exam  Specialty Comments:  No specialty comments available.  Imaging: No results found.   PMFS History: Patient Active Problem List   Diagnosis Date Noted   Carpal tunnel syndrome, bilateral 08/23/2021   Neuropathy 10/19/2020   DDD (degenerative disc disease), lumbar 07/16/2018    Class: Chronic   Other spondylosis with radiculopathy, lumbar region 07/16/2018    Class: Chronic   S/P lumbar fusion 07/13/2018   Tenosynovitis, de Quervain 08/10/2017   Multiple lipomas 07/15/2017   Insomnia 07/16/2016   Morbid obesity with BMI of 40.0-44.9, adult ([place]) 10/26/2015   Malignant neoplasm of lower-inner quadrant of right breast of female, estrogen receptor positive ([place]) 08/08/2015   S/P revision of total knee 07/24/2015   Loose total knee arthroplasty ([place]) 07/02/2015   Type 2 diabetes mellitus without complications ([name]) 53/97/6734  Adhesive capsulitis of left shoulder 10/11/2014   Paraspinous mass, left 03/31/2013   Other symptoms and signs involving the nervous system 03/31/2013   Internal hemorrhoid 02/17/2013   Neoplasm of soft tissue, left shoulder and left upper arm 08/03/2012   Cholelithiasis with cholecystitis 10/08/2011   Hypokalemia 09/17/2011   Gastro-esophageal reflux disease without esophagitis 08/08/2010   Gout 12/18/2008   Essential (primary) hypertension 03/15/2007   OA (osteoarthritis) of knee 03/15/2007   HEADACHE 03/15/2007   Past Medical History:  Diagnosis Date   Breast cancer ([place]) 2017   right breast   Breast cancer  of lower-inner quadrant of right female breast ([place]) 08/08/2015   Colon polyps    DDD (degenerative disc disease), cervical    Degenerative joint disease    Diabetes mellitus without complication (HCC)    Diverticulosis    Gallstones    GERD (gastroesophageal reflux disease)    Headache(784.0)    migraine like per patient   Hypertension    Low back pain    dr Hardin Negus, pain management   Osteoarthritis    Personal history of chemotherapy 2017   Personal history of radiation therapy 2017    Family History  Problem Relation Age of Onset   Stomach cancer Mother    Heart disease Father    Prostate cancer Father    Kidney cancer Sister        one out of the four   Hypertension Sister    Hypertension Brother    Crohn's disease Other        nephew   Colon cancer Maternal Grandmother    Esophageal cancer Neg Hx     Past Surgical History:  Procedure Laterality Date   CARPAL TUNNEL RELEASE Right 09/30/2021   Procedure: Right CARPAL TUNNEL RELEASE;  Surgeon: Sherilyn Cooter, MD;  Location: [facility];  Service: Orthopedics;  Laterality: Right;   CHOLECYSTECTOMY  10/28/2011   Procedure: LAPAROSCOPIC CHOLECYSTECTOMY WITH INTRAOPERATIVE CHOLANGIOGRAM;  Surgeon: Earnstine Regal, MD;  [facility];  Service: General;  Laterality: N/A;   COLONOSCOPY WITH ESOPHAGOGASTRODUODENOSCOPY (EGD)  04/13/2018   per Dr. Henrene Pastor, adenomatous polyp, repeat in 5 yrs    EXAM UNDER ANESTHESIA WITH MANIPULATION OF KNEE Right 05/30/2003   EXAM UNDER ANESTHESIA WITH MANIPULATION OF KNEE Left 12/27/2002   GANGLION CYST EXCISION Right    right wrist   KNEE ARTHROSCOPY Bilateral 01/02/2000   KNEE ARTHROSCOPY Right    LAPAROSCOPIC VAGINAL HYSTERECTOMY WITH SALPINGO OOPHORECTOMY Bilateral 07/13/2006   LIPOMA EXCISION Right 02/21/2008   hip   MASS EXCISION  08/24/2012   Procedure: EXCISION MASS;  Surgeon: Earnstine Regal, MD;  Location: [facility];  Service: General;  Laterality:  Left;  excisie soft tissue masses left shoulder(back) & left upper arm   MASS EXCISION Left 01/24/2014   Procedure: EXCISION SOFT TISSUE MASSES LOWER LEFT BACK;  Surgeon: Earnstine Regal, MD;  Location: [facility];  Service: General;  Laterality: Left;   MASS EXCISION Right 04/30/2016   Procedure: EXCISION OF SKIN RIGHT CHEST WALL;  Surgeon: Autumn Messing III, MD;  Location: [facility];  Service: General;  Laterality: Right;  EXCISION OF SKIN RIGHT CHEST WALL   MASTECTOMY Right 2017   MASTECTOMY W/ SENTINEL NODE BIOPSY Right 10/01/2015   Procedure: RIGHT MASTECTOMY WITH SENTINEL LYMPH NODE BIOPSY;  Surgeon: Autumn Messing III, MD;  Location: [facility];  Service: General;  Laterality: Right;   PORT-A-CATH REMOVAL Left 04/30/2016   Procedure:  REMOVAL PORT-A-CATH;  Surgeon: Autumn Messing III, MD;  Location: [facility];  Service: General;  Laterality: Left;  REMOVAL PORT-A-CATH   PORTACATH PLACEMENT Left 11/01/2015   Procedure: INSERTION PORT-A-CATH;  Surgeon: Autumn Messing III, MD;  Location: [facility];  Service: General;  Laterality: Left;   POSTERIOR FUSION LUMBAR SPINE  07/13/2018   per Dr. Louanne Skye, at L4-5 and L5-S1   REPLACEMENT UNICONDYLAR JOINT KNEE Left 04/20/2001   REVISION TOTAL KNEE ARTHROPLASTY Right 06/18/2004   REVISION TOTAL KNEE ARTHROPLASTY Left 11/15/2002   REVISION TOTAL KNEE ARTHROPLASTY Left 10/12/2001   REVISION TOTAL KNEE ARTHROPLASTY Right 07/09/2015   TONSILLECTOMY     TOTAL KNEE ARTHROPLASTY Right 04/25/2003   TOTAL KNEE ARTHROPLASTY Left 06/25/2009   Social History   Occupational History   Occupation: retired/disabled  Tobacco Use   Smoking status: Some Days    Packs/day: 0.25    Years: 20.00    Total pack years: 5.00    Types: Cigarettes    Last attempt to quit: 05/08/2020    Years since quitting: 1.8   Smokeless tobacco: Former    Quit date: 09/30/2015   Tobacco comments:    smokes about 4 cigs/day; working on quitting -  08/22/16 gwd  Vaping Use   Vaping Use: Never used  Substance and Sexual Activity   Alcohol use: No    Alcohol/week: 0.0 standard drinks of alcohol   Drug use: No   Sexual activity: Yes    Birth control/protection: Post-menopausal

## 2022-03-21 ENCOUNTER — Other Ambulatory Visit: Payer: Self-pay | Admitting: Family Medicine

## 2022-03-23 ENCOUNTER — Other Ambulatory Visit: Payer: Self-pay | Admitting: Family Medicine

## 2022-03-24 NOTE — Telephone Encounter (Signed)
Pt LOV was on 02/24/22 Last refill done on 10/18/2021 Please advise

## 2022-04-09 ENCOUNTER — Ambulatory Visit
Admission: RE | Admit: 2022-04-09 | Discharge: 2022-04-09 | Disposition: A | Discharge status: Home / Self Care | Payer: 59 | Source: Ambulatory Visit | Attending: Surgery | Admitting: Surgery

## 2022-04-09 DIAGNOSIS — M545 Low back pain, unspecified: Secondary | ICD-10-CM

## 2022-04-09 DIAGNOSIS — Z981 Arthrodesis status: Secondary | ICD-10-CM

## 2022-04-09 DIAGNOSIS — M5136 Other intervertebral disc degeneration, lumbar region: Secondary | ICD-10-CM

## 2022-04-09 MED ORDER — GADOBENATE DIMEGLUMINE 529 MG/ML IV SOLN
20.0000 mL | Freq: Once | INTRAVENOUS | Status: AC | PRN
  Administered 2022-04-09: 20 mL via INTRAVENOUS

## 2022-04-10 ENCOUNTER — Other Ambulatory Visit: Payer: 59

## 2022-04-17 ENCOUNTER — Other Ambulatory Visit: Payer: Self-pay | Admitting: Family Medicine

## 2022-04-17 ENCOUNTER — Ambulatory Visit: Payer: 59 | Admitting: Surgery

## 2022-04-17 DIAGNOSIS — I10 Essential (primary) hypertension: Secondary | ICD-10-CM

## 2022-05-05 ENCOUNTER — Other Ambulatory Visit (HOSPITAL_COMMUNITY): Payer: Self-pay

## 2022-05-14 ENCOUNTER — Ambulatory Visit (INDEPENDENT_AMBULATORY_CARE_PROVIDER_SITE_OTHER): Payer: 59

## 2022-05-14 ENCOUNTER — Ambulatory Visit (INDEPENDENT_AMBULATORY_CARE_PROVIDER_SITE_OTHER): Payer: 59 | Admitting: Orthopaedic Surgery

## 2022-05-14 ENCOUNTER — Encounter: Payer: Self-pay | Admitting: Orthopaedic Surgery

## 2022-05-14 VITALS — BP 154/88 | HR 83 | Ht 67.0 in | Wt 269.8 lb

## 2022-05-14 DIAGNOSIS — M65311 Trigger thumb, right thumb: Secondary | ICD-10-CM

## 2022-05-14 DIAGNOSIS — M25511 Pain in right shoulder: Secondary | ICD-10-CM

## 2022-05-14 DIAGNOSIS — M542 Cervicalgia: Secondary | ICD-10-CM | POA: Diagnosis not present

## 2022-05-14 DIAGNOSIS — M7541 Impingement syndrome of right shoulder: Secondary | ICD-10-CM

## 2022-05-14 DIAGNOSIS — Z981 Arthrodesis status: Secondary | ICD-10-CM

## 2022-05-14 DIAGNOSIS — M5136 Other intervertebral disc degeneration, lumbar region: Secondary | ICD-10-CM

## 2022-05-14 MED ORDER — BUPIVACAINE HCL 0.25 % IJ SOLN
0.5000 mL | INTRAMUSCULAR | Status: AC | PRN
  Administered 2022-05-14: .5 mL

## 2022-05-14 MED ORDER — BUPIVACAINE HCL 0.25 % IJ SOLN
4.0000 mL | INTRAMUSCULAR | Status: AC | PRN
  Administered 2022-05-14: 4 mL via INTRA_ARTICULAR

## 2022-05-14 MED ORDER — METHYLPREDNISOLONE ACETATE 40 MG/ML IJ SUSP
40.0000 mg | INTRAMUSCULAR | Status: AC | PRN
  Administered 2022-05-14: 40 mg via INTRA_ARTICULAR

## 2022-05-14 MED ORDER — METHYLPREDNISOLONE ACETATE 40 MG/ML IJ SUSP
20.0000 mg | INTRAMUSCULAR | Status: AC | PRN
  Administered 2022-05-14: 20 mg

## 2022-05-14 MED ORDER — LIDOCAINE HCL 1 % IJ SOLN
0.5000 mL | INTRAMUSCULAR | Status: AC | PRN
  Administered 2022-05-14: .5 mL

## 2022-05-14 NOTE — Progress Notes (Signed)
Office Visit Note   Patient: [name]           Date of Birth: Jan 09, 1961           MRN: [id number] Visit Date: 05/14/2022              Requested by: Laurey Morale, MD [place],   [id number] PCP: Laurey Morale, MD   Assessment & Plan: Visit Diagnoses:  1. Neck pain   2. Acute pain of right shoulder   3. Trigger thumb, right thumb   4. Impingement syndrome of right shoulder   5. DDD (degenerative disc disease), lumbar   6. S/P lumbar fusion     Plan: Right trigger thumb inject the right subacromial injection performed.  She has had right carpal tunnel release still has left carpal tunnel syndrome that bothers her to some degree.  She can follow-up if she has ongoing symptoms.  She has had chronic neck symptoms previous MRI scan years ago showed significant foraminal stenosis with spondylosis which has progressed at least by plain radiograph.  We reviewed her MRI scan findings lumbar spine gave her a copy report she has some progression at L3-4 adjacent level next to her solid L4-S1 fusion.  Overall only mild central stenosis at L3-4.  Good decompression at lower 2 levels which are surgically fused.  Follow-Up Instructions: Return if symptoms worsen or fail to improve.   Orders:  Orders Placed This Encounter  Procedures   Large Joint Inj: R subacromial bursa   Hand/UE Inj: R thumb A1   XR Cervical Spine 2 or 3 views   XR Shoulder Right   No orders of the defined types were placed in this encounter.     Procedures: Large Joint Inj: R subacromial bursa on 05/14/2022 9:47 AM Indications: pain Details: 22 G 1.5 in needle  Arthrogram: No  Medications: 4 mL bupivacaine 0.25 %; 40 mg methylPREDNISolone acetate 40 MG/ML; 0.5 mL lidocaine 1 % Outcome: tolerated well, no immediate complications Procedure, treatment alternatives, risks and benefits explained, specific risks discussed. Consent was given by the patient. Immediately prior to  procedure a time out was called to verify the correct patient, procedure, equipment, support staff and site/side marked as required. Patient was prepped and draped in the usual sterile fashion.    Hand/UE Inj: R thumb A1 for trigger finger on 05/14/2022 9:47 AM Medications: 0.5 mL lidocaine 1 %; 0.5 mL bupivacaine 0.25 %; 20 mg methylPREDNISolone acetate 40 MG/ML      Clinical Data: No additional findings.   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  She is in pain management with Dr. Hardin Negus on oxycodone and methadone.  She has had previous two-level lumbar fusion Dr. Louanne Skye 2019 L4-S1.  Recently triggering of the right thumb increased pain right shoulder.  Posterior for cervical spondylosis with foraminal stenosis C5-6 C6-7.  Review of Systems all the systems noncontributory HPI.   Objective: Vital Signs: BP (!) 154/88   Pulse 83   Ht '5\' 7"'$  (1.702 m)   Wt 269 lb 12.8 oz (122.4 kg)   BMI 42.26 kg/m   Physical Exam Constitutional:      Appearance: She is well-developed.     Comments: Conversant, pleasant, mild sedation secondary to pain medication.  HENT:  Head: Normocephalic.     Right Ear: External ear normal.     Left Ear: External ear normal. There is no impacted cerumen.  Eyes:     Pupils: Pupils are equal, round, and reactive to light.  Neck:     Thyroid: No thyromegaly.     Trachea: No tracheal deviation.  Cardiovascular:     Rate and Rhythm: Normal rate.  Pulmonary:     Effort: Pulmonary effort is normal.  Abdominal:     Palpations: Abdomen is soft.  Musculoskeletal:     Cervical back: No rigidity.  Skin:    General: Skin is warm and dry.  Neurological:     Mental Status: She is alert and oriented to person, place, and time.  Psychiatric:        Behavior: Behavior normal.     Ortho Exam  right thumb active triggering tenderness over the A1 pulley.  Positive impingement right shoulder.  Some brachial plexus tenderness decreased cervical range of motion 50%.  Very complex tenderness right and left.  Well-healed carpal tunnel incision in the right hand positive compression test carpal canal left hand.  Patient ambulates with a cane.  There is bilateral decreased sensation stocking distribution consistent with peripheral neuropathy.  Specialty Comments:  No specialty comments available.  Imaging: XR Cervical Spine 2 or 3 views  Result Date: 05/14/2022 AP lateral cervical spine images obtained and reviewed this shows spondylosis C5-6 with disc space narrowing anterior posterior osteophytes and lesser osteophytes at C6-7 with maintenance of disc space at C6-7.  Uncovertebral changes noted on AP image. Pression: Mid cervical spondylosis as described above, C5-6, C6-7.  XR Shoulder Right  Result Date: 05/14/2022 Three-view x-rays right shoulder obtained and reviewed this shows previous multiple metal clips from lymph node dissection.  Right shoulder shows no significant arthritic changes.  Good position glenohumeral joint mild acromioclavicular changes.  Some prominent subacromial spur. Impression: Right shoulder joint negative for acute changes.   Narrative & Impression  CLINICAL DATA:  Low back pain. Symptoms persist with greater than 6 weeks of treatment. Lumbar radiculopathy. Worsening back pain and right lower extremity pain.   EXAM: MRI LUMBAR SPINE WITHOUT AND WITH CONTRAST   TECHNIQUE: Multiplanar and multiecho pulse sequences of the lumbar spine were obtained without and with intravenous contrast.   CONTRAST:  72m MULTIHANCE GADOBENATE DIMEGLUMINE 529 MG/ML IV SOLN   COMPARISON:  Radiography 03/19/2022.  MRI 10/31/2017.   FINDINGS: Segmentation:  5 lumbar type vertebral bodies.   Alignment:  No malalignment.   Vertebrae: No fracture or focal bone lesion.  Previous fusion procedure from L4 to the sacrum without finding to suggest nonunion.   Conus medullaris and cauda equina: Conus extends to the L1-2 level. Conus and cauda equina appear normal.   Paraspinal and other soft tissues: Negative   Disc levels:   No significant finding from T10-11 through L2-3.   L3-4: Bulging of the disc. Facet and ligamentous hypertrophy. Mild multifactorial stenosis, worsened since 2019.   L4 to sacrum: Previous posterior decompression, diskectomy and fusion procedure. Probable solid union with wide patency of the canal and foramina. No complicating feature suspected.   IMPRESSION: Good appearance in the fusion segment from L4 to the sacrum.   Worsening of adjacent segment degenerative disease at L3-4. Bulging of the disc. Facet and ligamentous hypertrophy. Mild multifactorial stenosis which could be symptomatic.     Electronically Signed   By: MNelson ChimesM.D.   On: 04/09/2022 16:20  PMFS History: Patient Active Problem List   Diagnosis Date Noted   Trigger thumb, right thumb 05/14/2022   Impingement syndrome of right shoulder 05/14/2022   Carpal tunnel syndrome, bilateral 08/23/2021   Neuropathy 10/19/2020   DDD (degenerative disc disease), lumbar 07/16/2018    Class: Chronic   Other spondylosis with radiculopathy, lumbar region 07/16/2018    Class: Chronic   S/P lumbar fusion 07/13/2018   Tenosynovitis, de Quervain 08/10/2017   Multiple lipomas 07/15/2017   Insomnia 07/16/2016   Morbid obesity with BMI of 40.0-44.9, adult ([facility]) 10/26/2015   Malignant neoplasm of lower-inner quadrant of right breast of female, estrogen receptor positive ([facility]) 08/08/2015   S/P revision of total knee 07/24/2015   Loose total knee arthroplasty ([place]) 07/02/2015   Type 2 diabetes mellitus without complications ([facility]) 93/23/5573   Adhesive capsulitis of left shoulder 10/11/2014   Paraspinous mass, left 03/31/2013   Other symptoms and signs involving  the nervous system 03/31/2013   Internal hemorrhoid 02/17/2013   Neoplasm of soft tissue, left shoulder and left upper arm 08/03/2012   Cholelithiasis with cholecystitis 10/08/2011   Hypokalemia 09/17/2011   Gastro-esophageal reflux disease without esophagitis 08/08/2010   Gout 12/18/2008   Essential (primary) hypertension 03/15/2007   OA (osteoarthritis) of knee 03/15/2007   HEADACHE 03/15/2007   Past Medical History:  Diagnosis Date   Breast cancer ([facility]) 2017   right breast   Breast cancer of lower-inner quadrant of right female breast ([facility]) 08/08/2015   Colon polyps    DDD (degenerative disc disease), cervical    Degenerative joint disease    Diabetes mellitus without complication ([facility])    Diverticulosis    Gallstones    GERD (gastroesophageal reflux disease)    Headache(784.0)    migraine like per patient   Hypertension    Low back pain    dr Hardin Negus, pain management   Osteoarthritis    Personal history of chemotherapy 2017   Personal history of radiation therapy 2017    Family History  Problem Relation Age of Onset   Stomach cancer Mother    Heart disease Father    Prostate cancer Father    Kidney cancer Sister        one out of the four   Hypertension Sister    Hypertension Brother    Crohn's disease Other        nephew   Colon cancer Maternal Grandmother    Esophageal cancer Neg Hx     Past Surgical History:  Procedure Laterality Date   CARPAL TUNNEL RELEASE Right 09/30/2021   Procedure: Right CARPAL TUNNEL RELEASE;  Surgeon: Sherilyn Cooter, MD;  Location: [facility];  Service: Orthopedics;  Laterality: Right;   CHOLECYSTECTOMY  10/28/2011   Procedure: LAPAROSCOPIC CHOLECYSTECTOMY WITH INTRAOPERATIVE CHOLANGIOGRAM;  Surgeon: Earnstine Regal, MD;  Location: [facility];  Service: General;  Laterality: N/A;   COLONOSCOPY WITH ESOPHAGOGASTRODUODENOSCOPY (EGD)  04/13/2018   per Dr. Henrene Pastor, adenomatous polyp, repeat in 5 yrs    EXAM UNDER ANESTHESIA  WITH MANIPULATION OF KNEE Right 05/30/2003   EXAM UNDER ANESTHESIA WITH MANIPULATION OF KNEE Left 12/27/2002   GANGLION CYST EXCISION Right    right wrist   KNEE ARTHROSCOPY Bilateral 01/02/2000   KNEE ARTHROSCOPY Right    LAPAROSCOPIC VAGINAL HYSTERECTOMY WITH SALPINGO OOPHORECTOMY Bilateral 07/13/2006   LIPOMA EXCISION Right 02/21/2008   hip   MASS EXCISION  08/24/2012   Procedure: EXCISION MASS;  Surgeon: Earnstine Regal, MD;  Location: [facility];  Service: General;  Laterality: Left;  excisie soft tissue masses left shoulder(back) & left upper arm   MASS EXCISION Left 01/24/2014   Procedure: EXCISION SOFT TISSUE MASSES LOWER LEFT BACK;  Surgeon: Earnstine Regal, MD;  Location: [facility];  Service: General;  Laterality: Left;   MASS EXCISION Right 04/30/2016   Procedure: EXCISION OF SKIN RIGHT CHEST WALL;  Surgeon: Autumn Messing III, MD;  Location: [facility];  Service: General;  Laterality: Right;  EXCISION OF SKIN RIGHT CHEST WALL   MASTECTOMY Right 2017   MASTECTOMY W/ SENTINEL NODE BIOPSY Right 10/01/2015   Procedure: RIGHT MASTECTOMY WITH SENTINEL LYMPH NODE BIOPSY;  Surgeon: Autumn Messing III, MD;  Location: [facility];  Service: General;  Laterality: Right;   PORT-A-CATH REMOVAL Left 04/30/2016   Procedure: REMOVAL PORT-A-CATH;  Surgeon: Autumn Messing III, MD;  Location: [facility];  Service: General;  Laterality: Left;  REMOVAL PORT-A-CATH   PORTACATH PLACEMENT Left 11/01/2015   Procedure: INSERTION PORT-A-CATH;  Surgeon: Autumn Messing III, MD;  Location: [facility];  Service: General;  Laterality: Left;   POSTERIOR FUSION LUMBAR SPINE  07/13/2018   per Dr. Louanne Skye, at L4-5 and L5-S1   REPLACEMENT UNICONDYLAR JOINT KNEE Left 04/20/2001   REVISION TOTAL KNEE ARTHROPLASTY Right 06/18/2004   REVISION TOTAL KNEE ARTHROPLASTY Left 11/15/2002   REVISION TOTAL KNEE ARTHROPLASTY Left 10/12/2001   REVISION TOTAL KNEE ARTHROPLASTY Right  07/09/2015   TONSILLECTOMY     TOTAL KNEE ARTHROPLASTY Right 04/25/2003   TOTAL KNEE ARTHROPLASTY Left 06/25/2009   Social History   Occupational History   Occupation: retired/disabled  Tobacco Use   Smoking status: Some Days    Packs/day: 0.25    Years: 20.00    Total pack years: 5.00    Types: Cigarettes    Last attempt to quit: 05/08/2020    Years since quitting: 2.0   Smokeless tobacco: Former    Quit date: 09/30/2015   Tobacco comments:    smokes about 4 cigs/day; working on quitting - 08/22/16 gwd  Vaping Use   Vaping Use: Never used  Substance and Sexual Activity   Alcohol use: No    Alcohol/week: 0.0 standard drinks of alcohol   Drug use: No   Sexual activity: Yes    Birth control/protection: Post-menopausal

## 2022-05-15 ENCOUNTER — Other Ambulatory Visit: Payer: Self-pay | Admitting: Family Medicine

## 2022-05-15 DIAGNOSIS — I10 Essential (primary) hypertension: Secondary | ICD-10-CM

## 2022-05-24 ENCOUNTER — Other Ambulatory Visit: Payer: Self-pay | Admitting: Family Medicine

## 2022-05-24 DIAGNOSIS — I10 Essential (primary) hypertension: Secondary | ICD-10-CM

## 2022-05-27 ENCOUNTER — Other Ambulatory Visit (HOSPITAL_COMMUNITY): Payer: Self-pay

## 2022-06-16 ENCOUNTER — Other Ambulatory Visit: Payer: Self-pay | Admitting: Family Medicine

## 2022-07-07 ENCOUNTER — Encounter: Payer: Self-pay | Admitting: Family Medicine

## 2022-07-07 ENCOUNTER — Ambulatory Visit (INDEPENDENT_AMBULATORY_CARE_PROVIDER_SITE_OTHER): Payer: 59 | Admitting: Family Medicine

## 2022-07-07 VITALS — BP 132/80 | HR 70 | Temp 98.6°F | Wt 262.0 lb

## 2022-07-07 DIAGNOSIS — Z23 Encounter for immunization: Secondary | ICD-10-CM

## 2022-07-07 DIAGNOSIS — M79605 Pain in left leg: Secondary | ICD-10-CM

## 2022-07-07 MED ORDER — ASPIRIN 325 MG PO TBEC: 325.0000 mg | DELAYED_RELEASE_TABLET | Freq: Every day | ORAL | 0 refills | Status: DC

## 2022-07-07 NOTE — Addendum Note (Signed)
Addended by: Wyvonne Lenz on: 07/07/2022 10:58 AM   Modules accepted: Orders

## 2022-07-07 NOTE — Progress Notes (Signed)
   Subjective:    Patient ID: [name], female    DOB: 1961/07/25, 61 y.o.   MRN: [id number]  HPI Here for the sudden onset of swelling and pain in the left calf. No recent long car rides or plane flights. No recent trauma. No chest pain or SOB.    Review of Systems  Constitutional: Negative.   Respiratory: Negative.    Cardiovascular:  Positive for leg swelling. Negative for chest pain and palpitations.       Objective:   Physical Exam Constitutional:      Appearance: Normal appearance.  Cardiovascular:     Rate and Rhythm: Normal rate and regular rhythm.     Pulses: Normal pulses.     Heart sounds: Normal heart sounds.  Pulmonary:     Effort: Pulmonary effort is normal.     Breath sounds: Normal breath sounds.  Musculoskeletal:     Comments: The left calf does not appear to be swollen. No erythema or warmth. She is tender in the calf and Homan's is positive. No cords can be felt   Neurological:     Mental Status: She is alert.           Assessment & Plan:  Left calf pain and swelling. We need to check for a DVT. We will set up a venous doppler asap. She will begin taking an ASA 325 mg daily.  Alysia Penna, MD

## 2022-07-08 ENCOUNTER — Ambulatory Visit (HOSPITAL_COMMUNITY)
Admission: RE | Admit: 2022-07-08 | Discharge: 2022-07-08 | Disposition: A | Discharge status: Home / Self Care | Payer: 59 | Source: Ambulatory Visit | Attending: Family Medicine | Admitting: Family Medicine

## 2022-07-08 DIAGNOSIS — M79605 Pain in left leg: Secondary | ICD-10-CM

## 2022-07-09 ENCOUNTER — Telehealth: Payer: Self-pay | Admitting: Family Medicine

## 2022-07-09 NOTE — Telephone Encounter (Signed)
Pt called stating she received a call from the office regarding results

## 2022-07-10 NOTE — Telephone Encounter (Signed)
Message complete. Spoke with patient on 07/09/22.

## 2022-08-12 ENCOUNTER — Encounter: Payer: Self-pay | Admitting: Family Medicine

## 2022-08-12 ENCOUNTER — Ambulatory Visit (INDEPENDENT_AMBULATORY_CARE_PROVIDER_SITE_OTHER): Payer: 59 | Admitting: Family Medicine

## 2022-08-12 VITALS — BP 130/84 | Temp 98.6°F | Wt 262.0 lb

## 2022-08-12 DIAGNOSIS — L811 Chloasma: Secondary | ICD-10-CM

## 2022-08-12 DIAGNOSIS — M791 Myalgia, unspecified site: Secondary | ICD-10-CM | POA: Diagnosis not present

## 2022-08-12 DIAGNOSIS — E538 Deficiency of other specified B group vitamins: Secondary | ICD-10-CM

## 2022-08-12 DIAGNOSIS — R5383 Other fatigue: Secondary | ICD-10-CM

## 2022-08-12 DIAGNOSIS — E559 Vitamin D deficiency, unspecified: Secondary | ICD-10-CM | POA: Diagnosis not present

## 2022-08-12 DIAGNOSIS — R739 Hyperglycemia, unspecified: Secondary | ICD-10-CM | POA: Diagnosis not present

## 2022-08-12 LAB — HEMOGLOBIN A1C: Hgb A1c MFr Bld: 7.7 % — ABNORMAL HIGH (ref 4.6–6.5)

## 2022-08-12 LAB — CBC WITH DIFFERENTIAL/PLATELET
Basophils Absolute: 0.1 10*3/uL (ref 0.0–0.1)
Basophils Relative: 0.6 % (ref 0.0–3.0)
Eosinophils Absolute: 0.1 10*3/uL (ref 0.0–0.7)
Eosinophils Relative: 1.2 % (ref 0.0–5.0)
HCT: 42.7 % (ref 36.0–46.0)
Hemoglobin: 14.3 g/dL (ref 12.0–15.0)
Lymphocytes Relative: 30.6 % (ref 12.0–46.0)
Lymphs Abs: 2.6 10*3/uL (ref 0.7–4.0)
MCHC: 33.5 g/dL (ref 30.0–36.0)
MCV: 88.9 fl (ref 78.0–100.0)
Monocytes Absolute: 0.4 10*3/uL (ref 0.1–1.0)
Monocytes Relative: 4.4 % (ref 3.0–12.0)
Neutro Abs: 5.4 10*3/uL (ref 1.4–7.7)
Neutrophils Relative %: 63.2 % (ref 43.0–77.0)
Platelets: 342 10*3/uL (ref 150.0–400.0)
RBC: 4.8 Mil/uL (ref 3.87–5.11)
RDW: 13.9 % (ref 11.5–15.5)
WBC: 8.6 10*3/uL (ref 4.0–10.5)

## 2022-08-12 LAB — SEDIMENTATION RATE: Sed Rate: 48 mm/hr — ABNORMAL HIGH (ref 0–30)

## 2022-08-12 LAB — CK: Total CK: 234 U/L — ABNORMAL HIGH (ref 7–177)

## 2022-08-12 LAB — HEPATIC FUNCTION PANEL
ALT: 14 U/L (ref 0–35)
AST: 22 U/L (ref 0–37)
Albumin: 4.8 g/dL (ref 3.5–5.2)
Alkaline Phosphatase: 90 U/L (ref 39–117)
Bilirubin, Direct: 0.1 mg/dL (ref 0.0–0.3)
Total Bilirubin: 0.4 mg/dL (ref 0.2–1.2)
Total Protein: 7.9 g/dL (ref 6.0–8.3)

## 2022-08-12 LAB — C-REACTIVE PROTEIN: CRP: <1 mg/dL (ref 0.5–20.0)

## 2022-08-12 LAB — TSH: TSH: 2.61 u[IU]/mL (ref 0.35–5.50)

## 2022-08-12 LAB — VITAMIN D 25 HYDROXY (VIT D DEFICIENCY, FRACTURES): VITD: 13.1 ng/mL — ABNORMAL LOW (ref 30.00–100.00)

## 2022-08-12 LAB — VITAMIN B12: Vitamin B-12: 388 pg/mL (ref 211–911)

## 2022-08-12 MED ORDER — PREDNISONE 10 MG PO TABS: 10.0000 mg | ORAL_TABLET | Freq: Two times a day (BID) | ORAL | 0 refills | Status: DC

## 2022-08-12 NOTE — Progress Notes (Signed)
   Subjective:    Patient ID: [name], female    DOB: 1961/07/07, 61 y.o.   MRN: [id number]  HPI Here for several issues. First she has had dark areas of skin on her face and neck for years, and OTC  products have not made any improvements. Second she feels tired all the time and she wants to know why. She gets plenty of sleep. She knows her medications could be playing a role. Third she asks if she could have fibromyalgia because she usually feels stiffness and pain all over her body. She notes that her sister has been diagnosed with fibromyalgia. Most of her stiffness and pain are centered in the shoulders and the hips. This is worst when she has been still for periods of time, such as after lying in bed or sitting a long time. She takes her usual Oxycodone as part of her pain management program, but using this and Tylenol does not help much.    Review of Systems  Constitutional:  Positive for fatigue.  Respiratory: Negative.    Cardiovascular: Negative.   Gastrointestinal: Negative.   Genitourinary: Negative.   Musculoskeletal:  Positive for arthralgias and myalgias. Negative for joint swelling.       Objective:   Physical Exam Constitutional:      Appearance: Normal appearance.     Comments: Walks with a cane   Cardiovascular:     Rate and Rhythm: Normal rate and regular rhythm.     Pulses: Normal pulses.     Heart sounds: Normal heart sounds.  Pulmonary:     Effort: Pulmonary effort is normal.     Breath sounds: Normal breath sounds.  Musculoskeletal:        General: No swelling.     Comments: She is tender in all areas of the neck, back, trunk,a arms, and legs   Skin:    Comments: There are patches of hyperpigmented skin on her forehead, cheeks, and neck   Neurological:     Mental Status: She is alert.           Assessment & Plan:  Her fatigue is likely multifactorial, but we will get labs to check for anemia, thyroid issues, etc. She may in fact have  fibromyalgia, but we will check ESR, CRP, CK etc today. She may also have PMR, so we will treat her with Prednisone 20 mg daily for 2 weeks. She has melasma on the face, so we will refer her to Dermatology for this. We spent a total of ( 35  ) minutes reviewing records and discussing these issues.  Alysia Penna, MD

## 2022-08-13 LAB — RHEUMATOID FACTOR: Rheumatoid fact SerPl-aCnc: <14 IU/mL (ref <14)

## 2022-08-20 ENCOUNTER — Telehealth: Payer: Self-pay | Admitting: Family Medicine

## 2022-08-20 NOTE — Telephone Encounter (Signed)
Pt called to request a call back to go over her lab results   [phone]

## 2022-08-21 ENCOUNTER — Other Ambulatory Visit: Payer: Self-pay

## 2022-08-21 MED ORDER — VITAMIN D (ERGOCALCIFEROL) 1.25 MG (50000 UNIT) PO CAPS: 50000.0000 [IU] | ORAL_CAPSULE | ORAL | 3 refills | Status: DC

## 2022-08-21 NOTE — Telephone Encounter (Signed)
Pt lab results given by Otilio Miu, CMA

## 2022-09-01 ENCOUNTER — Encounter: Payer: Self-pay | Admitting: Family Medicine

## 2022-09-01 ENCOUNTER — Ambulatory Visit (INDEPENDENT_AMBULATORY_CARE_PROVIDER_SITE_OTHER): Payer: 59 | Admitting: Family Medicine

## 2022-09-01 VITALS — BP 120/78 | HR 82 | Temp 98.0°F | Wt 271.0 lb

## 2022-09-01 DIAGNOSIS — J40 Bronchitis, not specified as acute or chronic: Secondary | ICD-10-CM | POA: Diagnosis not present

## 2022-09-01 DIAGNOSIS — H6501 Acute serous otitis media, right ear: Secondary | ICD-10-CM | POA: Diagnosis not present

## 2022-09-01 MED ORDER — AMOXICILLIN-POT CLAVULANATE 875-125 MG PO TABS: 1.0000 | ORAL_TABLET | Freq: Two times a day (BID) | ORAL | 0 refills | Status: DC

## 2022-09-01 MED ORDER — HYDROCODONE BIT-HOMATROP MBR 5-1.5 MG/5ML PO SOLN: 5.0000 mL | ORAL | 0 refills | Status: DC | PRN

## 2022-09-01 NOTE — Progress Notes (Signed)
   Subjective:    Patient ID: [name], female    DOB: 1961/09/10, 61 y.o.   MRN: [id number]  HPI Here for one week of a deep cough that produces yellow sputum. She has a sharp pain in the right side when she coughs or takes a deep breath. No fever or SOB. She tested negative for Covid at home 2 days ago.    Review of Systems  Constitutional: Negative.   HENT:  Positive for congestion, ear pain and postnasal drip. Negative for sinus pressure and sore throat.   Eyes: Negative.   Respiratory:  Positive for cough and wheezing. Negative for shortness of breath.   Cardiovascular:  Positive for chest pain. Negative for palpitations and leg swelling.       Objective:   Physical Exam Constitutional:      Appearance: Normal appearance.     Comments: Coughing frequently   HENT:     Right Ear: Ear canal and external ear normal.     Left Ear: Tympanic membrane, ear canal and external ear normal.     Ears:     Comments: Right TM is red and dull     Nose: Nose normal.     Mouth/Throat:     Pharynx: Oropharynx is clear.  Eyes:     Conjunctiva/sclera: Conjunctivae normal.  Pulmonary:     Effort: Pulmonary effort is normal.     Breath sounds: Wheezing and rhonchi present. No rales.     Comments: She is tender around the right side  Lymphadenopathy:     Cervical: No cervical adenopathy.  Neurological:     Mental Status: She is alert.           Assessment & Plan:  She has a bronchitis and a right otitis media. We will treat these with 10 days of Augmentin. She can use Hycodan for the cough. She has strained some muscles in the right side by coughing, and these will heal over the next few weeks.  Alysia Penna, MD

## 2022-09-14 ENCOUNTER — Other Ambulatory Visit: Payer: Self-pay | Admitting: Family Medicine

## 2022-10-08 ENCOUNTER — Other Ambulatory Visit: Payer: Self-pay | Admitting: Family Medicine

## 2022-10-09 NOTE — Telephone Encounter (Signed)
Pt LOV was on 09/01/22 Last refill for Ambien was done on 03/24/22 Please advise

## 2022-10-21 ENCOUNTER — Encounter: Payer: Self-pay | Admitting: Orthopaedic Surgery

## 2022-10-21 ENCOUNTER — Ambulatory Visit: Payer: 59 | Admitting: Orthopaedic Surgery

## 2022-10-21 VITALS — BP 149/78 | HR 91 | Ht 67.0 in | Wt 271.0 lb

## 2022-10-21 DIAGNOSIS — M65311 Trigger thumb, right thumb: Secondary | ICD-10-CM

## 2022-10-21 DIAGNOSIS — G5602 Carpal tunnel syndrome, left upper limb: Secondary | ICD-10-CM

## 2022-10-21 NOTE — Progress Notes (Signed)
Office Visit Note   Patient: [name]           Date of Birth: 1960/11/13           MRN: [id number] Visit Date: 10/21/2022              Requested by: Laurey Morale, MD [place],  [place] [id number] PCP: Laurey Morale, MD   Assessment & Plan: Visit Diagnoses:  1. Trigger thumb, right thumb   2. Carpal tunnel syndrome, left upper limb     Plan: Patient with recurrent trigger thumb despite injection.  Will set up for right trigger thumb release and left carpal tunnel release under MAC anesthesia with local.  This will be as an outpatient procedure.  Follow-Up Instructions: No follow-ups on file.   Orders:  No orders of the defined types were placed in this encounter.  No orders of the defined types were placed in this encounter.     Procedures: No procedures performed   Clinical Data: No additional findings.   Subjective: Chief Complaint  Patient presents with   Right Thumb - Pain    HPI 62 year old female with A1c of 7.7 had previous right carpal tunnel release 1 year ago which is doing well.  She has had trigger thumb injection for her right thumb is locking again states it is extremely painful.  She also had carpal tunnel syndrome opposite left hand but has not had surgery on that left side.  History of right mastectomy with sentinel node biopsy 2017.  [facility] done [facility] 2016.  Interbody fusion done by [facility] L4-5 L5-S1.  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  She is right-hand dominant and states her thumb is killing her and she wants to proceed with trigger thumb release.  Opposite left hand had positive nerve conduction velocities for carpal tunnel syndrome still wakes her up at night and has not responded to splinting.  Review of Systems all other systems noncontributory to HPI as mentioned above.   Objective: Vital Signs: BP (!) 149/78   Pulse 91   Ht 5' 7"  (1.702 m)   Wt 271 lb (122.9 kg)   BMI  42.44 kg/m   Physical Exam Constitutional:      Appearance: She is well-developed.  HENT:     Head: Normocephalic.     Right Ear: External ear normal.     Left Ear: External ear normal. There is no impacted cerumen.  Eyes:     Pupils: Pupils are equal, round, and reactive to light.  Neck:     Thyroid: No thyromegaly.     Trachea: No tracheal deviation.  Cardiovascular:     Rate and Rhythm: Normal rate.  Pulmonary:     Effort: Pulmonary effort is normal.  Abdominal:     Palpations: Abdomen is soft.  Musculoskeletal:     Cervical back: No rigidity.  Skin:    General: Skin is warm and dry.  Neurological:     Mental Status: She is alert and oriented to person, place, and time.  Psychiatric:        Behavior: Behavior normal.     Ortho Exam thumb is locked in flexion.  Tenderness of the A1 pulley manually extend the thumb and then tape.  Extension with dorsal DIP splint.  Positive carpal compression test on the left  Specialty Comments:  No specialty comments available.  Imaging: No results found.   PMFS History: Patient Active  Problem List   Diagnosis Date Noted   Trigger thumb, right thumb 05/14/2022   Impingement syndrome of right shoulder 05/14/2022   Carpal tunnel syndrome, left upper limb 08/23/2021   Neuropathy 10/19/2020   DDD (degenerative disc disease), lumbar 07/16/2018    Class: Chronic   Other spondylosis with radiculopathy, lumbar region 07/16/2018    Class: Chronic   S/P lumbar fusion 07/13/2018   Tenosynovitis, de Quervain 08/10/2017   Multiple lipomas 07/15/2017   Insomnia 07/16/2016   Morbid obesity with BMI of 40.0-44.9, adult ([place]) 10/26/2015   Malignant neoplasm of lower-inner quadrant of right breast of female, estrogen receptor positive ([place]) 08/08/2015   S/P revision of total knee 07/24/2015   Loose total knee arthroplasty ([place]) 07/02/2015   Type 2 diabetes mellitus without complications ([place]) 03/00/9233   Adhesive capsulitis of left  shoulder 10/11/2014   Paraspinous mass, left 03/31/2013   Other symptoms and signs involving the nervous system 03/31/2013   Internal hemorrhoid 02/17/2013   Neoplasm of soft tissue, left shoulder and left upper arm 08/03/2012   Cholelithiasis with cholecystitis 10/08/2011   Hypokalemia 09/17/2011   Gastro-esophageal reflux disease without esophagitis 08/08/2010   Gout 12/18/2008   Essential (primary) hypertension 03/15/2007   OA (osteoarthritis) of knee 03/15/2007   HEADACHE 03/15/2007   Past Medical History:  Diagnosis Date   Breast cancer ([place]) 2017   right breast   Breast cancer of lower-inner quadrant of right female breast ([place]) 08/08/2015   Colon polyps    DDD (degenerative disc disease), cervical    Degenerative joint disease    Diabetes mellitus without complication (HCC)    Diverticulosis    Gallstones    GERD (gastroesophageal reflux disease)    Headache(784.0)    migraine like per patient   Hypertension    Low back pain    dr Hardin Negus, pain management   Osteoarthritis    Personal history of chemotherapy 2017   Personal history of radiation therapy 2017    Family History  Problem Relation Age of Onset   Stomach cancer Mother    Heart disease Father    Prostate cancer Father    Kidney cancer Sister        one out of the four   Hypertension Sister    Hypertension Brother    Crohn's disease Other        nephew   Colon cancer Maternal Grandmother    Esophageal cancer Neg Hx     Past Surgical History:  Procedure Laterality Date   CARPAL TUNNEL RELEASE Right 09/30/2021   Procedure: Right CARPAL TUNNEL RELEASE;  Surgeon: Sherilyn Cooter, MD;  Location: [facility];  Service: Orthopedics;  Laterality: Right;   CHOLECYSTECTOMY  10/28/2011   Procedure: LAPAROSCOPIC CHOLECYSTECTOMY WITH INTRAOPERATIVE CHOLANGIOGRAM;  Surgeon: Earnstine Regal, MD;  [facility];  Service: General;  Laterality: N/A;   COLONOSCOPY WITH ESOPHAGOGASTRODUODENOSCOPY  (EGD)  04/13/2018   per Dr. Henrene Pastor, adenomatous polyp, repeat in 5 yrs    EXAM UNDER ANESTHESIA WITH MANIPULATION OF KNEE Right 05/30/2003   EXAM UNDER ANESTHESIA WITH MANIPULATION OF KNEE Left 12/27/2002   GANGLION CYST EXCISION Right    right wrist   KNEE ARTHROSCOPY Bilateral 01/02/2000   KNEE ARTHROSCOPY Right    LAPAROSCOPIC VAGINAL HYSTERECTOMY WITH SALPINGO OOPHORECTOMY Bilateral 07/13/2006   LIPOMA EXCISION Right 02/21/2008   hip   MASS EXCISION  08/24/2012   Procedure: EXCISION MASS;  Surgeon: Earnstine Regal, MD;  Location: [facility];  Service: General;  Laterality: Left;  excisie soft tissue masses left shoulder(back) & left upper arm   MASS EXCISION Left 01/24/2014   Procedure: EXCISION SOFT TISSUE MASSES LOWER LEFT BACK;  Surgeon: Earnstine Regal, MD;  Location: [facility];  Service: General;  Laterality: Left;   MASS EXCISION Right 04/30/2016   Procedure: EXCISION OF SKIN RIGHT CHEST WALL;  Surgeon: Autumn Messing III, MD;  Location: [facility];  Service: General;  Laterality: Right;  EXCISION OF SKIN RIGHT CHEST WALL   MASTECTOMY Right 2017   MASTECTOMY W/ SENTINEL NODE BIOPSY Right 10/01/2015   Procedure: RIGHT MASTECTOMY WITH SENTINEL LYMPH NODE BIOPSY;  Surgeon: Autumn Messing III, MD;  Location: [facility];  Service: General;  Laterality: Right;   PORT-A-CATH REMOVAL Left 04/30/2016   Procedure: REMOVAL PORT-A-CATH;  Surgeon: Autumn Messing III, MD;  Location: [facility];  Service: General;  Laterality: Left;  REMOVAL PORT-A-CATH   PORTACATH PLACEMENT Left 11/01/2015   Procedure: INSERTION PORT-A-CATH;  Surgeon: Autumn Messing III, MD;  Location: [facility];  Service: General;  Laterality: Left;   POSTERIOR FUSION LUMBAR SPINE  07/13/2018   per Dr. Louanne Skye, at L4-5 and L5-S1   REPLACEMENT UNICONDYLAR JOINT KNEE Left 04/20/2001   REVISION TOTAL KNEE ARTHROPLASTY Right 06/18/2004   REVISION TOTAL KNEE ARTHROPLASTY Left  11/15/2002   REVISION TOTAL KNEE ARTHROPLASTY Left 10/12/2001   REVISION TOTAL KNEE ARTHROPLASTY Right 07/09/2015   TONSILLECTOMY     TOTAL KNEE ARTHROPLASTY Right 04/25/2003   TOTAL KNEE ARTHROPLASTY Left 06/25/2009   Social History   Occupational History   Occupation: retired/disabled  Tobacco Use   Smoking status: Some Days    Packs/day: 0.25    Years: 20.00    Total pack years: 5.00    Types: Cigarettes    Last attempt to quit: 05/08/2020    Years since quitting: 2.4   Smokeless tobacco: Former    Quit date: 09/30/2015   Tobacco comments:    smokes about 4 cigs/day; working on quitting - 08/22/16 gwd  Vaping Use   Vaping Use: Never used  Substance and Sexual Activity   Alcohol use: No    Alcohol/week: 0.0 standard drinks of alcohol   Drug use: No   Sexual activity: Yes    Birth control/protection: Post-menopausal

## 2022-10-27 DIAGNOSIS — M65311 Trigger thumb, right thumb: Secondary | ICD-10-CM | POA: Diagnosis not present

## 2022-10-27 DIAGNOSIS — G5602 Carpal tunnel syndrome, left upper limb: Secondary | ICD-10-CM | POA: Diagnosis not present

## 2022-11-03 ENCOUNTER — Ambulatory Visit (INDEPENDENT_AMBULATORY_CARE_PROVIDER_SITE_OTHER): Payer: 59

## 2022-11-03 DIAGNOSIS — G5602 Carpal tunnel syndrome, left upper limb: Secondary | ICD-10-CM

## 2022-11-03 NOTE — Progress Notes (Signed)
Patient came in for bandage removal. Also placed patient in a removable velrcro splint on the left side.

## 2022-11-10 ENCOUNTER — Other Ambulatory Visit: Payer: Self-pay | Admitting: Family Medicine

## 2022-11-11 ENCOUNTER — Telehealth: Payer: Self-pay

## 2022-11-11 ENCOUNTER — Ambulatory Visit (INDEPENDENT_AMBULATORY_CARE_PROVIDER_SITE_OTHER): Payer: 59 | Admitting: Orthopaedic Surgery

## 2022-11-11 VITALS — BP 147/88 | HR 79 | Ht 67.0 in | Wt 271.0 lb

## 2022-11-11 DIAGNOSIS — M65311 Trigger thumb, right thumb: Secondary | ICD-10-CM

## 2022-11-11 DIAGNOSIS — G5602 Carpal tunnel syndrome, left upper limb: Secondary | ICD-10-CM

## 2022-11-11 NOTE — Telephone Encounter (Signed)
Patient called and states she feels like her incisions are getting infected I asked her why she thought that, she said its irritated and looks like it is pus filled No fever or draining She has an appt tomorrow morning Wants to make sure it is ok to wait

## 2022-11-11 NOTE — Progress Notes (Signed)
9  Post-Op Visit Note   Patient: [name]           Date of Birth: 1961/06/08           MRN: [id number] Visit Date: 11/11/2022 PCP: Laurey Morale, MD   Assessment & Plan: 62 year old female post trigger thumb annular release right hand and carpal tunnel release left hand.  Thumb had trace serous drainage.  No triggering.  2 sutures of the carpal tunnel have pulled free without separation of the incision.  Sutures removed Steri-Strips applied she will keep her wrist splint on.  Recheck 1 week for wound check.  No cellulitis no evidence of tenosynovitis.  Chief Complaint:  Chief Complaint  Patient presents with   Right Hand - Routine Post Op    10/27/2022 right trigger thumb release   Left Hand - Routine Post Op    10/27/2022 left CTR   Visit Diagnoses:  1. Trigger thumb, right thumb   2. Carpal tunnel syndrome, left upper limb     Plan: Return 1 week for incision checks.  Follow-Up Instructions: Return in about 1 week (around 11/18/2022).   Orders:  No orders of the defined types were placed in this encounter.  No orders of the defined types were placed in this encounter.   Imaging: No results found.  PMFS History: Patient Active Problem List   Diagnosis Date Noted   Trigger thumb, right thumb 05/14/2022   Impingement syndrome of right shoulder 05/14/2022   Carpal tunnel syndrome, left upper limb 08/23/2021   Neuropathy 10/19/2020   DDD (degenerative disc disease), lumbar 07/16/2018    Class: Chronic   Other spondylosis with radiculopathy, lumbar region 07/16/2018    Class: Chronic   S/P lumbar fusion 07/13/2018   Tenosynovitis, de Quervain 08/10/2017   Multiple lipomas 07/15/2017   Insomnia 07/16/2016   Morbid obesity with BMI of 40.0-44.9, adult ([place]) 10/26/2015   Malignant neoplasm of lower-inner quadrant of right breast of female, estrogen receptor positive ([place]) 08/08/2015   S/P revision of total knee 07/24/2015   Loose total knee arthroplasty ([place])  07/02/2015   Type 2 diabetes mellitus without complications ([place]) [id number]   Adhesive capsulitis of left shoulder 10/11/2014   Paraspinous mass, left 03/31/2013   Other symptoms and signs involving the nervous system 03/31/2013   Internal hemorrhoid 02/17/2013   Neoplasm of soft tissue, left shoulder and left upper arm 08/03/2012   Cholelithiasis with cholecystitis 10/08/2011   Hypokalemia 09/17/2011   Gastro-esophageal reflux disease without esophagitis 08/08/2010   Gout 12/18/2008   Essential (primary) hypertension 03/15/2007   OA (osteoarthritis) of knee 03/15/2007   HEADACHE 03/15/2007   Past Medical History:  Diagnosis Date   Breast cancer ([place]) 2017   right breast   Breast cancer of lower-inner quadrant of right female breast ([place]) 08/08/2015   Colon polyps    DDD (degenerative disc disease), cervical    Degenerative joint disease    Diabetes mellitus without complication (HCC)    Diverticulosis    Gallstones    GERD (gastroesophageal reflux disease)    Headache(784.0)    migraine like per patient   Hypertension    Low back pain    dr Hardin Negus, pain management   Osteoarthritis    Personal history of chemotherapy 2017   Personal history of radiation therapy 2017    Family History  Problem Relation Age of Onset   Stomach cancer Mother    Heart disease Father    Prostate cancer Father  Kidney cancer Sister        one out of the four   Hypertension Sister    Hypertension Brother    Crohn's disease Other        nephew   Colon cancer Maternal Grandmother    Esophageal cancer Neg Hx     Past Surgical History:  Procedure Laterality Date   CARPAL TUNNEL RELEASE Right 09/30/2021   Procedure: Right CARPAL TUNNEL RELEASE;  Surgeon: Sherilyn Cooter, MD;  Location: [facility];  Service: Orthopedics;  Laterality: Right;   CHOLECYSTECTOMY  10/28/2011   Procedure: LAPAROSCOPIC CHOLECYSTECTOMY WITH INTRAOPERATIVE CHOLANGIOGRAM;  Surgeon: Earnstine Regal,  MD;  [facility];  Service: General;  Laterality: N/A;   COLONOSCOPY WITH ESOPHAGOGASTRODUODENOSCOPY (EGD)  04/13/2018   per Dr. Henrene Pastor, adenomatous polyp, repeat in 5 yrs    EXAM UNDER ANESTHESIA WITH MANIPULATION OF KNEE Right 05/30/2003   EXAM UNDER ANESTHESIA WITH MANIPULATION OF KNEE Left 12/27/2002   GANGLION CYST EXCISION Right    right wrist   KNEE ARTHROSCOPY Bilateral 01/02/2000   KNEE ARTHROSCOPY Right    LAPAROSCOPIC VAGINAL HYSTERECTOMY WITH SALPINGO OOPHORECTOMY Bilateral 07/13/2006   LIPOMA EXCISION Right 02/21/2008   hip   MASS EXCISION  08/24/2012   Procedure: EXCISION MASS;  Surgeon: Earnstine Regal, MD;  Location: [facility];  Service: General;  Laterality: Left;  excisie soft tissue masses left shoulder(back) & left upper arm   MASS EXCISION Left 01/24/2014   Procedure: EXCISION SOFT TISSUE MASSES LOWER LEFT BACK;  Surgeon: Earnstine Regal, MD;  Location: [facility] [place];  Service: General;  Laterality: Left;   MASS EXCISION Right 04/30/2016   Procedure: EXCISION OF SKIN RIGHT CHEST WALL;  Surgeon: Autumn Messing III, MD;  Location: [place] [facility];  Service: General;  Laterality: Right;  EXCISION OF SKIN RIGHT CHEST WALL   MASTECTOMY Right 2017   MASTECTOMY W/ SENTINEL NODE BIOPSY Right 10/01/2015   Procedure: RIGHT MASTECTOMY WITH SENTINEL LYMPH NODE BIOPSY;  Surgeon: Autumn Messing III, MD;  Location: [facility];  Service: General;  Laterality: Right;   PORT-A-CATH REMOVAL Left 04/30/2016   Procedure: REMOVAL PORT-A-CATH;  Surgeon: Autumn Messing III, MD;  Location: [facility];  Service: General;  Laterality: Left;  REMOVAL PORT-A-CATH   PORTACATH PLACEMENT Left 11/01/2015   Procedure: INSERTION PORT-A-CATH;  Surgeon: Autumn Messing III, MD;  Location: [facility];  Service: General;  Laterality: Left;   POSTERIOR FUSION LUMBAR SPINE  07/13/2018   per Dr. Louanne Skye, at L4-5 and L5-S1   REPLACEMENT UNICONDYLAR JOINT KNEE Left  04/20/2001   REVISION TOTAL KNEE ARTHROPLASTY Right 06/18/2004   REVISION TOTAL KNEE ARTHROPLASTY Left 11/15/2002   REVISION TOTAL KNEE ARTHROPLASTY Left 10/12/2001   REVISION TOTAL KNEE ARTHROPLASTY Right 07/09/2015   TONSILLECTOMY     TOTAL KNEE ARTHROPLASTY Right 04/25/2003   TOTAL KNEE ARTHROPLASTY Left 06/25/2009   Social History   Occupational History   Occupation: retired/disabled  Tobacco Use   Smoking status: Some Days    Packs/day: 0.25    Years: 20.00    Total pack years: 5.00    Types: Cigarettes    Last attempt to quit: 05/08/2020    Years since quitting: 2.5   Smokeless tobacco: Former    Quit date: 09/30/2015   Tobacco comments:    smokes about 4 cigs/day; working on quitting - 08/22/16 gwd  Vaping Use   Vaping Use: Never used  Substance and Sexual Activity  Alcohol use: No    Alcohol/week: 0.0 standard drinks of alcohol   Drug use: No   Sexual activity: Yes    Birth control/protection: Post-menopausal

## 2022-11-11 NOTE — Telephone Encounter (Signed)
I called patient and scheduled for 1pm today.

## 2022-11-12 ENCOUNTER — Encounter: Payer: 59 | Admitting: Orthopaedic Surgery

## 2022-11-21 ENCOUNTER — Encounter: Payer: 59 | Admitting: Orthopaedic Surgery

## 2022-12-01 ENCOUNTER — Other Ambulatory Visit: Payer: Self-pay | Admitting: Family Medicine

## 2022-12-01 DIAGNOSIS — I10 Essential (primary) hypertension: Secondary | ICD-10-CM

## 2022-12-16 ENCOUNTER — Encounter: Payer: Self-pay | Admitting: Orthopaedic Surgery

## 2022-12-16 ENCOUNTER — Ambulatory Visit (INDEPENDENT_AMBULATORY_CARE_PROVIDER_SITE_OTHER): Payer: 59 | Admitting: Orthopaedic Surgery

## 2022-12-16 VITALS — BP 148/84 | Ht 67.0 in | Wt 271.0 lb

## 2022-12-16 DIAGNOSIS — G5602 Carpal tunnel syndrome, left upper limb: Secondary | ICD-10-CM

## 2022-12-16 DIAGNOSIS — M7541 Impingement syndrome of right shoulder: Secondary | ICD-10-CM | POA: Diagnosis not present

## 2022-12-16 DIAGNOSIS — M65311 Trigger thumb, right thumb: Secondary | ICD-10-CM

## 2022-12-16 MED ORDER — METHYLPREDNISOLONE ACETATE 40 MG/ML IJ SUSP
40.0000 mg | INTRAMUSCULAR | Status: AC | PRN
  Administered 2022-12-16: 40 mg via INTRA_ARTICULAR

## 2022-12-16 MED ORDER — BUPIVACAINE HCL 0.25 % IJ SOLN
4.0000 mL | INTRAMUSCULAR | Status: AC | PRN
  Administered 2022-12-16: 4 mL via INTRA_ARTICULAR

## 2022-12-16 MED ORDER — LIDOCAINE HCL 1 % IJ SOLN
0.5000 mL | INTRAMUSCULAR | Status: AC | PRN
  Administered 2022-12-16: .5 mL

## 2022-12-16 NOTE — Progress Notes (Signed)
Office Visit Note   Patient: [name]           Date of Birth: 1960/12/21           MRN: [id number] Visit Date: 12/16/2022              Requested by: Laurey Morale, MD [place],  [place] [address] PCP: Laurey Morale, MD   Assessment & Plan: Visit Diagnoses:  1. Impingement syndrome of right shoulder   2. Trigger thumb, right thumb   3. Carpal tunnel syndrome, left upper limb     Plan: Right subacromial injection performed with good relief she will follow-up if she has ongoing problems she is happy with the results of trigger thumb release on the right hand and left carpal tunnel release.  Follow-Up Instructions: No follow-ups on file.   Orders:  No orders of the defined types were placed in this encounter.  No orders of the defined types were placed in this encounter.     Procedures: Large Joint Inj: R subacromial bursa on 12/16/2022 3:08 PM Indications: pain Details: 22 G 1.5 in needle  Arthrogram: No  Medications: 4 mL bupivacaine 0.25 %; 40 mg methylPREDNISolone acetate 40 MG/ML; 0.5 mL lidocaine 1 % Outcome: tolerated well, no immediate complications Procedure, treatment alternatives, risks and benefits explained, specific risks discussed. Consent was given by the patient. Immediately prior to procedure a time out was called to verify the correct patient, procedure, equipment, support staff and site/side marked as required. Patient was prepped and draped in the usual sterile fashion.       Clinical Data: No additional findings.   Subjective: Chief Complaint  Patient presents with   Right Hand - Follow-up, Routine Post Op    10/27/2022 right trigger thumb release   Left Hand - Follow-up, Routine Post Op    10/27/2022 Left CTR   Right Shoulder - Pain    HPI postop carpal tunnel release good relief of preop numbness she still has slight tenderness at the incision.  Trigger thumb release right hand doing great.  She is complaining  of right shoulder pain had previous injection in August and is requesting a repeat injection right shoulder.  Review of Systems updated unchanged   Objective: Vital Signs: BP (!) 148/84   Ht 5\' 7"  (1.702 m)   Wt 271 lb (122.9 kg)   BMI 42.44 kg/m   Physical Exam Constitutional:      Appearance: She is well-developed.  HENT:     Head: Normocephalic.     Right Ear: External ear normal.     Left Ear: External ear normal. There is no impacted cerumen.  Eyes:     Pupils: Pupils are equal, round, and reactive to light.  Neck:     Thyroid: No thyromegaly.     Trachea: No tracheal deviation.  Cardiovascular:     Rate and Rhythm: Normal rate.  Pulmonary:     Effort: Pulmonary effort is normal.  Abdominal:     Palpations: Abdomen is soft.  Musculoskeletal:     Cervical back: No rigidity.  Skin:    General: Skin is warm and dry.  Neurological:     Mental Status: She is alert and oriented to person, place, and time.  Psychiatric:        Behavior: Behavior normal.     Ortho Exam right trigger thumb and left carpal tunnel incision look good positive impingement right shoulder.  Negative drop arm test.  Good cervical range of motion no brachial plexus tenderness.  Specialty Comments:  No specialty comments available.  Imaging: No results found.   PMFS History: Patient Active Problem List   Diagnosis Date Noted   Trigger thumb, right thumb 05/14/2022   Impingement syndrome of right shoulder 05/14/2022   Carpal tunnel syndrome, left upper limb 08/23/2021   Neuropathy 10/19/2020   DDD (degenerative disc disease), lumbar 07/16/2018    Class: Chronic   Other spondylosis with radiculopathy, lumbar region 07/16/2018    Class: Chronic   S/P lumbar fusion 07/13/2018   Tenosynovitis, de Quervain 08/10/2017   Multiple lipomas 07/15/2017   Insomnia 07/16/2016   Morbid obesity with BMI of 40.0-44.9, adult ([facility]) 10/26/2015   Malignant neoplasm of lower-inner quadrant of right  breast of female, estrogen receptor positive ([facility] [facility]) 08/08/2015   S/P revision of total knee 07/24/2015   Loose total knee arthroplasty ([facility]) 07/02/2015   Type 2 diabetes mellitus without complications ([facility]) [id number]   Adhesive capsulitis of left shoulder 10/11/2014   Paraspinous mass, left 03/31/2013   Other symptoms and signs involving the nervous system 03/31/2013   Internal hemorrhoid 02/17/2013   Neoplasm of soft tissue, left shoulder and left upper arm 08/03/2012   Cholelithiasis with cholecystitis 10/08/2011   Hypokalemia 09/17/2011   Gastro-esophageal reflux disease without esophagitis 08/08/2010   Gout 12/18/2008   Essential (primary) hypertension 03/15/2007   OA (osteoarthritis) of knee 03/15/2007   HEADACHE 03/15/2007   Past Medical History:  Diagnosis Date   Breast cancer ([facility]) 2017   right breast   Breast cancer of lower-inner quadrant of right female breast ([facility]) 08/08/2015   Colon polyps    DDD (degenerative disc disease), cervical    Degenerative joint disease    Diabetes mellitus without complication (HCC)    Diverticulosis    Gallstones    GERD (gastroesophageal reflux disease)    Headache(784.0)    migraine like per patient   Hypertension    Low back pain    dr Hardin Negus, pain management   Osteoarthritis    Personal history of chemotherapy 2017   Personal history of radiation therapy 2017    Family History  Problem Relation Age of Onset   Stomach cancer Mother    Heart disease Father    Prostate cancer Father    Kidney cancer Sister        one out of the four   Hypertension Sister    Hypertension Brother    Crohn's disease Other        nephew   Colon cancer Maternal Grandmother    Esophageal cancer Neg Hx     Past Surgical History:  Procedure Laterality Date   CARPAL TUNNEL RELEASE Right 09/30/2021   Procedure: Right CARPAL TUNNEL RELEASE;  Surgeon: Sherilyn Cooter, MD;  Location: [facility];  Service: Orthopedics;  Laterality:  Right;   CHOLECYSTECTOMY  10/28/2011   Procedure: LAPAROSCOPIC CHOLECYSTECTOMY WITH INTRAOPERATIVE CHOLANGIOGRAM;  Surgeon: Earnstine Regal, MD;  Location: [facility];  Service: General;  Laterality: N/A;   COLONOSCOPY WITH ESOPHAGOGASTRODUODENOSCOPY (EGD)  04/13/2018   per Dr. Henrene Pastor, adenomatous polyp, repeat in 5 yrs    EXAM UNDER ANESTHESIA WITH MANIPULATION OF KNEE Right 05/30/2003   EXAM UNDER ANESTHESIA WITH MANIPULATION OF KNEE Left 12/27/2002   GANGLION CYST EXCISION Right    right wrist   KNEE ARTHROSCOPY Bilateral 01/02/2000   KNEE ARTHROSCOPY Right    LAPAROSCOPIC VAGINAL HYSTERECTOMY WITH SALPINGO OOPHORECTOMY Bilateral 07/13/2006   LIPOMA EXCISION Right  02/21/2008   hip   MASS EXCISION  08/24/2012   Procedure: EXCISION MASS;  Surgeon: Earnstine Regal, MD;  Location: [facility];  Service: General;  Laterality: Left;  excisie soft tissue masses left shoulder(back) & left upper arm   MASS EXCISION Left 01/24/2014   Procedure: EXCISION SOFT TISSUE MASSES LOWER LEFT BACK;  Surgeon: Earnstine Regal, MD;  Location: [facility];  Service: General;  Laterality: Left;   MASS EXCISION Right 04/30/2016   Procedure: EXCISION OF SKIN RIGHT CHEST WALL;  Surgeon: Autumn Messing III, MD;  Location: [facility];  Service: General;  Laterality: Right;  EXCISION OF SKIN RIGHT CHEST WALL   MASTECTOMY Right 2017   MASTECTOMY W/ SENTINEL NODE BIOPSY Right 10/01/2015   Procedure: RIGHT MASTECTOMY WITH SENTINEL LYMPH NODE BIOPSY;  Surgeon: Autumn Messing III, MD;  Location: [facility];  Service: General;  Laterality: Right;   PORT-A-CATH REMOVAL Left 04/30/2016   Procedure: REMOVAL PORT-A-CATH;  Surgeon: Autumn Messing III, MD;  Location: [facility];  Service: General;  Laterality: Left;  REMOVAL PORT-A-CATH   PORTACATH PLACEMENT Left 11/01/2015   Procedure: INSERTION PORT-A-CATH;  Surgeon: Autumn Messing III, MD;  Location: [facility];  Service: General;   Laterality: Left;   POSTERIOR FUSION LUMBAR SPINE  07/13/2018   per Dr. Louanne Skye, at L4-5 and L5-S1   REPLACEMENT UNICONDYLAR JOINT KNEE Left 04/20/2001   REVISION TOTAL KNEE ARTHROPLASTY Right 06/18/2004   REVISION TOTAL KNEE ARTHROPLASTY Left 11/15/2002   REVISION TOTAL KNEE ARTHROPLASTY Left 10/12/2001   REVISION TOTAL KNEE ARTHROPLASTY Right 07/09/2015   TONSILLECTOMY     TOTAL KNEE ARTHROPLASTY Right 04/25/2003   TOTAL KNEE ARTHROPLASTY Left 06/25/2009   Social History   Occupational History   Occupation: retired/disabled  Tobacco Use   Smoking status: Some Days    Packs/day: 0.25    Years: 20.00    Additional pack years: 0.00    Total pack years: 5.00    Types: Cigarettes    Last attempt to quit: 05/08/2020    Years since quitting: 2.6   Smokeless tobacco: Former    Quit date: 09/30/2015   Tobacco comments:    smokes about 4 cigs/day; working on quitting - 08/22/16 gwd  Vaping Use   Vaping Use: Never used  Substance and Sexual Activity   Alcohol use: No    Alcohol/week: 0.0 standard drinks of alcohol   Drug use: No   Sexual activity: Yes    Birth control/protection: Post-menopausal

## 2023-01-05 ENCOUNTER — Telehealth: Payer: Self-pay | Admitting: Family Medicine

## 2023-01-05 NOTE — Telephone Encounter (Signed)
Call in Prednisone 20 mg, to take BID for 5 days as needed for gout flares, #60 with no rf

## 2023-01-05 NOTE — Telephone Encounter (Signed)
Pt is calling and her PMR has flare up and would like prednisone call in to  [id number] - [place], [place] - [address] Phone: [phone]  Fax: [phone]    Pt was last send in nov 2023

## 2023-01-06 ENCOUNTER — Other Ambulatory Visit: Payer: Self-pay

## 2023-01-06 MED ORDER — PREDNISONE 20 MG PO TABS: ORAL_TABLET | ORAL | 0 refills | Status: DC

## 2023-01-06 NOTE — Telephone Encounter (Signed)
Rx sent 

## 2023-01-09 ENCOUNTER — Other Ambulatory Visit: Payer: Self-pay | Admitting: Family Medicine

## 2023-01-09 DIAGNOSIS — Z1231 Encounter for screening mammogram for malignant neoplasm of breast: Secondary | ICD-10-CM

## 2023-02-04 ENCOUNTER — Other Ambulatory Visit: Payer: Self-pay | Admitting: Family Medicine

## 2023-02-16 ENCOUNTER — Other Ambulatory Visit: Payer: Self-pay | Admitting: Family Medicine

## 2023-02-16 DIAGNOSIS — I10 Essential (primary) hypertension: Secondary | ICD-10-CM

## 2023-02-17 ENCOUNTER — Ambulatory Visit: Payer: 59

## 2023-02-26 ENCOUNTER — Other Ambulatory Visit: Payer: Self-pay | Admitting: Family Medicine

## 2023-02-26 DIAGNOSIS — I10 Essential (primary) hypertension: Secondary | ICD-10-CM

## 2023-03-03 ENCOUNTER — Encounter: Payer: 59 | Admitting: Family Medicine

## 2023-03-03 ENCOUNTER — Encounter: Payer: Self-pay | Admitting: Family Medicine

## 2023-03-03 NOTE — Progress Notes (Signed)
Virtual Visit via Video Note I connected with [name] on 03/03/23 by a video enabled telemedicine application and verified that I am speaking with the correct person using two identifiers. Location patient: home Location provider:work or home office Persons participating in the virtual visit: patient, provider  I discussed the limitations of evaluation and management by telemedicine and the availability of in person appointments. The patient expressed understanding and agreed to proceed.  Chief Complaint  Patient presents with   Abdominal Pain    Ongoing a week, lower side, and affecting back with some sharp pains.     HPI:  ROS: See pertinent positives and negatives per HPI.  Past Medical History:  Diagnosis Date   Breast cancer (HCC) 2017   right breast   Breast cancer of lower-inner quadrant of right female breast ([facility]) 08/08/2015   Colon polyps    DDD (degenerative disc disease), cervical    Degenerative joint disease    Diabetes mellitus without complication ([facility]CC)    Diverticulosis    Gallstones    GERD (gastroesophageal reflux disease)    Headache(784.0)    migraine like per patient   Hypertension    Low back pain    dr Vear Clock, pain management   Osteoarthritis    Personal history of chemotherapy 2017   Personal history of radiation therapy 2017    Past Surgical History:  Procedure Laterality Date   CARPAL TUNNEL RELEASE Right 09/30/2021   Procedure: Right CARPAL TUNNEL RELEASE;  Surgeon: Marlyne Beards, MD;  Location: [facility];  Service: Orthopedics;  Laterality: Right;   CHOLECYSTECTOMY  10/28/2011   Procedure: LAPAROSCOPIC CHOLECYSTECTOMY WITH INTRAOPERATIVE CHOLANGIOGRAM;  Surgeon: Velora Heckler, MD;  [facility];  Service: General;  Laterality: N/A;   COLONOSCOPY WITH ESOPHAGOGASTRODUODENOSCOPY (EGD)  04/13/2018   per Dr. Marina Goodell, adenomatous polyp, repeat in 5 yrs    EXAM UNDER ANESTHESIA WITH MANIPULATION OF KNEE Right  05/30/2003   EXAM UNDER ANESTHESIA WITH MANIPULATION OF KNEE Left 12/27/2002   GANGLION CYST EXCISION Right    right wrist   KNEE ARTHROSCOPY Bilateral 01/02/2000   KNEE ARTHROSCOPY Right    LAPAROSCOPIC VAGINAL HYSTERECTOMY WITH SALPINGO OOPHORECTOMY Bilateral 07/13/2006   LIPOMA EXCISION Right 02/21/2008   hip   MASS EXCISION  08/24/2012   Procedure: EXCISION MASS;  Surgeon: Velora Heckler, MD;  Location: [facility];  Service: General;  Laterality: Left;  excisie soft tissue masses left shoulder(back) & left upper arm   MASS EXCISION Left 01/24/2014   Procedure: EXCISION SOFT TISSUE MASSES LOWER LEFT BACK;  Surgeon: Velora Heckler, MD;  Location: [facility];  Service: General;  Laterality: Left;   MASS EXCISION Right 04/30/2016   Procedure: EXCISION OF SKIN RIGHT CHEST WALL;  Surgeon: Chevis Pretty III, MD;  Location: [facility];  Service: General;  Laterality: Right;  EXCISION OF SKIN RIGHT CHEST WALL   MASTECTOMY Right 2017   MASTECTOMY W/ SENTINEL NODE BIOPSY Right 10/01/2015   Procedure: RIGHT MASTECTOMY WITH SENTINEL LYMPH NODE BIOPSY;  Surgeon: Chevis Pretty III, MD;  Location[facility];  Service: General;  Laterality: Right;   PORT-A-CATH REMOVAL Left 04/30/2016   Procedure: REMOVAL PORT-A-CATH;  Surgeon: Chevis Pretty III, MD;  Location: [facility];  Service: General;  Laterality: Left;  REMOVAL PORT-A-CATH   PORTACATH PLACEMENT Left 11/01/2015   Procedure: INSERTION PORT-A-CATH;  Surgeon: Chevis Pretty III, MD;  Location: [facility];  Service: General;  Laterality:  Left;   POSTERIOR FUSION LUMBAR SPINE  07/13/2018   per Dr. Otelia Sergeant, at L4-5 and L5-S1   REPLACEMENT UNICONDYLAR JOINT KNEE Left 04/20/2001   REVISION TOTAL KNEE ARTHROPLASTY Right 06/18/2004   REVISION TOTAL KNEE ARTHROPLASTY Left 11/15/2002   REVISION TOTAL KNEE ARTHROPLASTY Left 10/12/2001   REVISION TOTAL KNEE ARTHROPLASTY Right 07/09/2015   TONSILLECTOMY      TOTAL KNEE ARTHROPLASTY Right 04/25/2003   TOTAL KNEE ARTHROPLASTY Left 06/25/2009    Family History  Problem Relation Age of Onset   Stomach cancer Mother    Heart disease Father    Prostate cancer Father    Kidney cancer Sister        one out of the four   Hypertension Sister    Hypertension Brother    Crohn's disease Other        nephew   Colon cancer Maternal Grandmother    Esophageal cancer Neg Hx     Social History   Socioeconomic History   Marital status: Married    Spouse name: Not on file   Number of children: 3   Years of education: Not on file   Highest education level: Not on file  Occupational History   Occupation: retired/disabled  Tobacco Use   Smoking status: Some Days    Packs/day: 0.25    Years: 20.00    Additional pack years: 0.00    Total pack years: 5.00    Types: Cigarettes    Last attempt to quit: 05/08/2020    Years since quitting: 2.8   Smokeless tobacco: Former    Quit date: 09/30/2015   Tobacco comments:    smokes about 4 cigs/day; working on quitting - 08/22/16 gwd  Vaping Use   Vaping Use: Never used  Substance and Sexual Activity   Alcohol use: No    Alcohol/week: 0.0 standard drinks of alcohol   Drug use: No   Sexual activity: Yes    Birth control/protection: Post-menopausal  Other Topics Concern   Not on file  Social History Narrative   Not on file   Social Determinants of Health   Financial Resource Strain: Not on file  Food Insecurity: Not on file  Transportation Needs: Not on file  Physical Activity: Not on file  Stress: Not on file  Social Connections: Not on file  Intimate Partner Violence: Not on file     Current Outpatient Medications:    amitriptyline (ELAVIL) 100 MG tablet, Take 150 mg by mouth at bedtime. , Disp: , Rfl:    amLODipine (NORVASC) 10 MG tablet, TAKE 1 TABLET BY MOUTH EVERY DAY, Disp: 90 tablet, Rfl: 0   aspirin EC 325 MG tablet, Take 1 tablet (325 mg total) by mouth daily., Disp: 1 tablet, Rfl:  0   glucose blood (ONETOUCH ULTRA) test strip, USE TO TEST ONCE DAILY, Disp: 25 strip, Rfl: 1   metFORMIN (GLUCOPHAGE) 500 MG tablet, Take 1 tablet (500 mg total) by mouth 2 (two) times daily with a meal., Disp: 180 tablet, Rfl: 3   methadone (DOLOPHINE) 10 MG tablet, Take 10 mg by mouth every 8 (eight) hours. , Disp: , Rfl:    methocarbamol (ROBAXIN) 750 MG tablet, Take by mouth., Disp: , Rfl:    metoprolol tartrate (LOPRESSOR) 50 MG tablet, TAKE 1 TABLET BY MOUTH TWICE A DAY, Disp: 180 tablet, Rfl: 0   omeprazole (PRILOSEC) 40 MG capsule, TAKE 1 CAPSULE BY MOUTH EVERY DAY, Disp: 90 capsule, Rfl: 0   ondansetron (ZOFRAN) 8 MG tablet,  TAKE 1 TABLET BY MOUTH EVERY 6 HOURS AS NEEDED FOR NAUSEA OR VOMITING, Disp: 60 tablet, Rfl: 0   OneTouch Delica Lancets 33G MISC, USE TO TEST ONCE DAILY DX CODE E11.9, Disp: 100 each, Rfl: 0   Oxycodone HCl 10 MG TABS, Take 10 mg by mouth every 6 (six) hours as needed (for pain). , Disp: , Rfl:    Oxycodone HCl 10 MG TABS, Take 1 tablet by mouth every six hours as needed for pain, Disp: 120 tablet, Rfl: 0   potassium chloride SA (KLOR-CON M20) 20 MEQ tablet, Take 1 tablet (20 mEq total) by mouth daily., Disp: 90 tablet, Rfl: 0   predniSONE (DELTASONE) 20 MG tablet, Take 1 tab by mouth twice daily as needed for 5 days.  Gout Flare, Disp: 60 tablet, Rfl: 0   Vitamin D, Ergocalciferol, (DRISDOL) 1.25 MG (50000 UNIT) CAPS capsule, Take 1 capsule (50,000 Units total) by mouth every 7 (seven) days., Disp: 12 capsule, Rfl: 3   zolpidem (AMBIEN) 10 MG tablet, TAKE 2 TABLETS (20 MG TOTAL) BY MOUTH AT BEDTIME. INS ONLY PAYS ONE DAILY, Disp: 30 tablet, Rfl: 5  EXAM:  VITALS per patient if applicable:  GENERAL: alert, oriented, appears well and in no acute distress  HEENT: atraumatic, conjunctiva clear, no obvious abnormalities on inspection of external nose and ears  NECK: normal movements of the head and neck  LUNGS: on inspection no signs of respiratory distress,  breathing rate appears normal, no obvious gross SOB, gasping or wheezing  CV: no obvious cyanosis  MS: moves all visible extremities without noticeable abnormality  PSYCH/NEURO: pleasant and cooperative, no obvious depression or anxiety, speech and thought processing grossly intact  ASSESSMENT AND PLAN:  Discussed the following assessment and plan:  No diagnosis found.  There are no diagnoses linked to this encounter.  We discussed possible serious and likely etiologies, options for evaluation and workup, limitations of telemedicine visit vs in person visit, treatment, treatment risks and precautions. The patient was advised to call back or seek an in-person evaluation if the symptoms worsen or if the condition fails to improve as anticipated. I discussed the assessment and treatment plan with the patient. The patient was provided an opportunity to ask questions and all were answered. The patient agreed with the plan and demonstrated an understanding of the instructions.  No follow-ups on file.

## 2023-03-04 ENCOUNTER — Ambulatory Visit: Payer: 59 | Admitting: Family Medicine

## 2023-03-09 ENCOUNTER — Ambulatory Visit: Payer: 59 | Admitting: Family Medicine

## 2023-03-09 ENCOUNTER — Ambulatory Visit
Admission: RE | Admit: 2023-03-09 | Discharge: 2023-03-09 | Disposition: A | Discharge status: Home / Self Care | Payer: 59 | Source: Ambulatory Visit | Attending: Family Medicine | Admitting: Family Medicine

## 2023-03-09 ENCOUNTER — Encounter: Payer: Self-pay | Admitting: Family Medicine

## 2023-03-09 VITALS — BP 132/84 | Temp 98.8°F | Wt 263.0 lb

## 2023-03-09 DIAGNOSIS — N390 Urinary tract infection, site not specified: Secondary | ICD-10-CM | POA: Diagnosis not present

## 2023-03-09 DIAGNOSIS — R109 Unspecified abdominal pain: Secondary | ICD-10-CM | POA: Diagnosis not present

## 2023-03-09 DIAGNOSIS — M65312 Trigger thumb, left thumb: Secondary | ICD-10-CM

## 2023-03-09 DIAGNOSIS — M79645 Pain in left finger(s): Secondary | ICD-10-CM | POA: Diagnosis not present

## 2023-03-09 DIAGNOSIS — Z1231 Encounter for screening mammogram for malignant neoplasm of breast: Secondary | ICD-10-CM

## 2023-03-09 LAB — POC URINALSYSI DIPSTICK (AUTOMATED)
Bilirubin, UA: NEGATIVE
Glucose, UA: NEGATIVE
Ketones, UA: NEGATIVE
Leukocytes, UA: NEGATIVE
Nitrite, UA: NEGATIVE
Protein, UA: POSITIVE — AB
Spec Grav, UA: 1.015 (ref 1.010–1.025)
Urobilinogen, UA: 0.2 E.U./dL
pH, UA: 6 (ref 5.0–8.0)

## 2023-03-09 MED ORDER — CIPROFLOXACIN HCL 500 MG PO TABS: 500.0000 mg | ORAL_TABLET | Freq: Two times a day (BID) | ORAL | 0 refills | Status: DC

## 2023-03-09 NOTE — Progress Notes (Signed)
   Subjective:    Patient ID: [name], female    DOB: 1961-07-31, 62 y.o.   MRN: [id number]  HPI Here for 2 issues. First she has had pain in the left thumb for the past 6 weeks. No hx of trauma. The pain is in 2 different areas. Also for the past 9 days she has had right flank pain and lower abdominal pain with urgency and frequency of urination. No fever. She has been nauseated but has not vomited. She is drinking lots of water. BM's are regular.    Review of Systems  Constitutional: Negative.   Respiratory: Negative.    Cardiovascular: Negative.   Gastrointestinal:  Positive for abdominal pain and nausea. Negative for abdominal distention, blood in stool, constipation, diarrhea and vomiting.  Genitourinary:  Positive for frequency and urgency. Negative for dysuria.       Objective:   Physical Exam Constitutional:      Appearance: Normal appearance. She is not ill-appearing.  Cardiovascular:     Rate and Rhythm: Normal rate and regular rhythm.     Pulses: Normal pulses.     Heart sounds: Normal heart sounds.  Pulmonary:     Effort: Pulmonary effort is normal.     Breath sounds: Normal breath sounds.  Abdominal:     General: There is no distension.     Palpations: There is no mass.     Tenderness: There is no right CVA tenderness, left CVA tenderness, guarding or rebound.     Hernia: No hernia is present.     Comments: Mildly tender in the right flank and above the pubis   Musculoskeletal:     Comments: Left thumb is not swollen. She is very tender over the MCP and there is a palpable click in the IP joint when extending the thumb   Neurological:     Mental Status: She is alert.           Assessment & Plan:  She has a UTI, and we will treat this with 7 days of Cipro. Culture the sample. She also has OA in the left thumb MCP and a trigger finger at the IP. Refer to Hand Surgery.  Gershon Crane, MD

## 2023-03-10 LAB — URINE CULTURE
MICRO NUMBER:: 15090595
Result:: NO GROWTH
SPECIMEN QUALITY:: ADEQUATE

## 2023-03-16 ENCOUNTER — Telehealth: Payer: Self-pay | Admitting: Family Medicine

## 2023-03-16 NOTE — Telephone Encounter (Signed)
Re:  Mammogram for 03/09/23  Pt called to say they refused to do the mammogram because it was supposed to say diagnostic mammogram  Pt is asking that referral be amended.

## 2023-03-17 ENCOUNTER — Other Ambulatory Visit: Payer: Self-pay | Admitting: Family Medicine

## 2023-03-17 DIAGNOSIS — M79622 Pain in left upper arm: Secondary | ICD-10-CM

## 2023-03-18 NOTE — Telephone Encounter (Signed)
These were sent in yesterday.

## 2023-03-23 ENCOUNTER — Encounter: Payer: Self-pay | Admitting: Internal Medicine

## 2023-03-23 ENCOUNTER — Ambulatory Visit (AMBULATORY_SURGERY_CENTER): Payer: 59

## 2023-03-23 VITALS — Ht 67.0 in | Wt 263.0 lb

## 2023-03-23 DIAGNOSIS — Z86010 Personal history of colonic polyps: Secondary | ICD-10-CM

## 2023-03-23 MED ORDER — NA SULFATE-K SULFATE-MG SULF 17.5-3.13-1.6 GM/177ML PO SOLN
1.0000 | Freq: Once | ORAL | 0 refills | Status: AC
Start: 2023-03-23 — End: 2023-03-23

## 2023-03-23 NOTE — Progress Notes (Signed)
No egg or soy allergy known to patient  No issues known to pt with past sedation with any surgeries or procedures Patient denies ever being told they had issues or difficulty with intubation  No FH of Malignant Hyperthermia Pt is not on diet pills Pt is not on  home 02  Pt is not on blood thinners  Pt denies issues with constipation  No A fib or A flutter Have any cardiac testing pending--no  LOA: independent  Prep: suprep   Patient's chart reviewed by John Nulty CNRA prior to previsit and patient appropriate for the [facility].  Previsit completed and red dot placed by patient's name on their procedure day (on provider's schedule).     PV competed with patient. Prep instructions sent via mychart and home address. [facility] provided to use for price reduction if needed.   

## 2023-04-07 ENCOUNTER — Ambulatory Visit
Admission: RE | Admit: 2023-04-07 | Discharge: 2023-04-07 | Disposition: A | Discharge status: Home / Self Care | Payer: 59 | Source: Ambulatory Visit | Attending: Family Medicine | Admitting: Family Medicine

## 2023-04-07 ENCOUNTER — Other Ambulatory Visit: Payer: Self-pay | Admitting: Family Medicine

## 2023-04-07 DIAGNOSIS — M79622 Pain in left upper arm: Secondary | ICD-10-CM

## 2023-04-21 ENCOUNTER — Encounter: Payer: Self-pay | Admitting: Internal Medicine

## 2023-04-21 ENCOUNTER — Ambulatory Visit (AMBULATORY_SURGERY_CENTER): Payer: 59 | Admitting: Internal Medicine

## 2023-04-21 VITALS — BP 154/79 | HR 72 | Temp 98.0°F | Resp 14 | Ht 67.0 in | Wt 263.0 lb

## 2023-04-21 DIAGNOSIS — Z86010 Personal history of colonic polyps: Secondary | ICD-10-CM | POA: Diagnosis not present

## 2023-04-21 DIAGNOSIS — D123 Benign neoplasm of transverse colon: Secondary | ICD-10-CM | POA: Diagnosis not present

## 2023-04-21 DIAGNOSIS — D122 Benign neoplasm of ascending colon: Secondary | ICD-10-CM | POA: Diagnosis not present

## 2023-04-21 DIAGNOSIS — Z09 Encounter for follow-up examination after completed treatment for conditions other than malignant neoplasm: Secondary | ICD-10-CM

## 2023-04-21 HISTORY — PX: COLONOSCOPY: SHX174

## 2023-04-21 MED ORDER — SODIUM CHLORIDE 0.9 % IV SOLN
500.0000 mL | INTRAVENOUS | Status: DC
Start: 2023-04-21 — End: 2023-04-21

## 2023-04-21 NOTE — Progress Notes (Signed)
Pt's states no medical or surgical changes since previsit or office visit. 

## 2023-04-21 NOTE — Progress Notes (Signed)
Report to PACU, RN, vss, BBS= Clear.  

## 2023-04-21 NOTE — Progress Notes (Signed)
Called to room to assist during endoscopic procedure.  Patient ID and intended procedure confirmed with present staff. Received instructions for my participation in the procedure from the performing physician.  

## 2023-04-21 NOTE — Progress Notes (Signed)
HISTORY OF PRESENT ILLNESS:  [name] is a 62 y.o. female with a history of multiple and advanced adenomatous colon polyps.  Presents today for surveillance colonoscopy.  No complaints  REVIEW OF SYSTEMS:  All non-GI ROS negative except for  Past Medical History:  Diagnosis Date   Breast cancer (HCC) 2017   right breast   Breast cancer of lower-inner quadrant of right female breast (HCC) 08/08/2015   Colon polyps    DDD (degenerative disc disease), cervical    Degenerative joint disease    Diabetes mellitus without complication (HCC)    Diverticulosis    Gallstones    GERD (gastroesophageal reflux disease)    Headache(784.0)    migraine like per patient   Hypertension    Low back pain    dr Vear Clock, pain management   Osteoarthritis    Personal history of chemotherapy 2017   Personal history of radiation therapy 2017    Past Surgical History:  Procedure Laterality Date   CARPAL TUNNEL RELEASE Right 09/30/2021   Procedure: Right CARPAL TUNNEL RELEASE;  Surgeon: Marlyne Beards, MD;  Location: [facility];  Service: Orthopedics;  Laterality: Right;   CHOLECYSTECTOMY  10/28/2011   Procedure: LAPAROSCOPIC CHOLECYSTECTOMY WITH INTRAOPERATIVE CHOLANGIOGRAM;  Surgeon: Velora Heckler, MD;  Location: [facility];  Service: General;  Laterality: N/A;   COLONOSCOPY WITH ESOPHAGOGASTRODUODENOSCOPY (EGD)  04/13/2018   per Dr. Marina Goodell, adenomatous polyp, repeat in 5 yrs    EXAM UNDER ANESTHESIA WITH MANIPULATION OF KNEE Right 05/30/2003   EXAM UNDER ANESTHESIA WITH MANIPULATION OF KNEE Left 12/27/2002   GANGLION CYST EXCISION Right    right wrist   KNEE ARTHROSCOPY Bilateral 01/02/2000   KNEE ARTHROSCOPY Right    LAPAROSCOPIC VAGINAL HYSTERECTOMY WITH SALPINGO OOPHORECTOMY Bilateral 07/13/2006   LIPOMA EXCISION Right 02/21/2008   hip   MASS EXCISION  08/24/2012   Procedure: EXCISION MASS;  Surgeon: Velora Heckler, MD;  Location: [facility];  Service:  General;  Laterality: Left;  excisie soft tissue masses left shoulder(back) & left upper arm   MASS EXCISION Left 01/24/2014   Procedure: EXCISION SOFT TISSUE MASSES LOWER LEFT BACK;  Surgeon: Velora Heckler, MD;  Location: [facility];  Service: General;  Laterality: Left;   MASS EXCISION Right 04/30/2016   Procedure: EXCISION OF SKIN RIGHT CHEST WALL;  Surgeon: Chevis Pretty III, MD;  Location: [facility];  Service: General;  Laterality: Right;  EXCISION OF SKIN RIGHT CHEST WALL   MASTECTOMY Right 2017   MASTECTOMY W/ SENTINEL NODE BIOPSY Right 10/01/2015   Procedure: RIGHT MASTECTOMY WITH SENTINEL LYMPH NODE BIOPSY;  Surgeon: Chevis Pretty III, MD;  Location: [facility];  Service: General;  Laterality: Right;   PORT-A-CATH REMOVAL Left 04/30/2016   Procedure: REMOVAL PORT-A-CATH;  Surgeon: Chevis Pretty III, MD;  Location: [facility];  Service: General;  Laterality: Left;  REMOVAL PORT-A-CATH   PORTACATH PLACEMENT Left 11/01/2015   Procedure: INSERTION PORT-A-CATH;  Surgeon: Chevis Pretty III, MD;  Location: [facility];  Service: General;  Laterality: Left;   POSTERIOR FUSION LUMBAR SPINE  07/13/2018   per Dr. Otelia Sergeant, at L4-5 and L5-S1   REPLACEMENT UNICONDYLAR JOINT KNEE Left 04/20/2001   REVISION TOTAL KNEE ARTHROPLASTY Right 06/18/2004   REVISION TOTAL KNEE ARTHROPLASTY Left 11/15/2002   REVISION TOTAL KNEE ARTHROPLASTY Left 10/12/2001   REVISION TOTAL KNEE ARTHROPLASTY Right 07/09/2015   TONSILLECTOMY     TOTAL KNEE ARTHROPLASTY Right 04/25/2003  TOTAL KNEE ARTHROPLASTY Left 06/25/2009    Social History Zyeria Hayre  reports that she has been smoking cigarettes. She started smoking about 22 years ago. She has a 5 pack-year smoking history. She quit smokeless tobacco use about 7 years ago. She reports that she does not drink alcohol and does not use drugs.  family history includes Colon cancer in her maternal grandmother; Crohn's disease in  an other family member; Heart disease in her father; Hypertension in her brother and sister; Kidney cancer in her sister; Prostate cancer in her father; Stomach cancer in her mother.  Allergies  Allergen Reactions   Lisinopril Shortness Of Breath and Swelling    Angioedema   Codeine Itching, Nausea And Vomiting and Other (See Comments)    Itching all over the body   Latex Hives   Sulfonamide Derivatives Hives and Other (See Comments)    All over the body   Gabapentin Other (See Comments)    headache   Lyrica [Pregabalin] Other (See Comments)    headache       PHYSICAL EXAMINATION: Vital signs: BP (!) 159/96   Pulse 65   Temp 98 F (36.7 C)   Ht 5\' 7"  (1.702 m)   Wt 263 lb (119.3 kg)   SpO2 95%   BMI 41.19 kg/m  General: Well-developed, well-nourished, no acute distress HEENT: Sclerae are anicteric, conjunctiva pink. Oral mucosa intact Lungs: Clear Heart: Regular Abdomen: soft, nontender, nondistended, no obvious ascites, no peritoneal signs, normal bowel sounds. No organomegaly. Extremities: No edema Psychiatric: alert and oriented x3. Cooperative     ASSESSMENT:   Personal history of multiple and advanced adenomatous polyps  PLAN:   Surveillance colonoscopy

## 2023-04-21 NOTE — Patient Instructions (Signed)
Discharge instructions given. Handouts on polyps,diverticulosis and hemorrhoids. Resume previous medications. YOU HAD AN ENDOSCOPIC PROCEDURE TODAY AT THE [facility]:   Refer to the procedure report that was given to you for any specific questions about what was found during the examination.  If the procedure report does not answer your questions, please call your gastroenterologist to clarify.  If you requested that your care partner not be given the details of your procedure findings, then the procedure report has been included in a sealed envelope for you to review at your convenience later.  YOU SHOULD EXPECT: Some feelings of bloating in the abdomen. Passage of more gas than usual.  Walking can help get rid of the air that was put into your GI tract during the procedure and reduce the bloating. If you had a lower endoscopy (such as a colonoscopy or flexible sigmoidoscopy) you may notice spotting of blood in your stool or on the toilet paper. If you underwent a bowel prep for your procedure, you may not have a normal bowel movement for a few days.  Please Note:  You might notice some irritation and congestion in your nose or some drainage.  This is from the oxygen used during your procedure.  There is no need for concern and it should clear up in a day or so.  SYMPTOMS TO REPORT IMMEDIATELY:  Following lower endoscopy (colonoscopy or flexible sigmoidoscopy):  Excessive amounts of blood in the stool  Significant tenderness or worsening of abdominal pains  Swelling of the abdomen that is new, acute  Fever of 100F or higher   For urgent or emergent issues, a gastroenterologist can be reached at any hour by calling [phone]. Do not use MyChart messaging for urgent concerns.    DIET:  We do recommend a small meal at first, but then you may proceed to your regular diet.  Drink plenty of fluids but you should avoid alcoholic beverages for 24 hours.  ACTIVITY:  You should  plan to take it easy for the rest of today and you should NOT DRIVE or use heavy machinery until tomorrow (because of the sedation medicines used during the test).    FOLLOW UP: Our staff will call the number listed on your records the next business day following your procedure.  We will call around 7:15- 8:00 am to check on you and address any questions or concerns that you may have regarding the information given to you following your procedure. If we do not reach you, we will leave a message.     If any biopsies were taken you will be contacted by phone or by letter within the next 1-3 weeks.  Please call us at [phone] if you have not heard about the biopsies in 3 weeks.    SIGNATURES/CONFIDENTIALITY: You and/or your care partner have signed paperwork which will be entered into your electronic medical record.  These signatures attest to the fact that that the information above on your After Visit Summary has been reviewed and is understood.  Full responsibility of the confidentiality of this discharge information lies with you and/or your care-partner. 

## 2023-04-21 NOTE — Op Note (Signed)
[facility] Patient Name: [name] Procedure Date: 04/21/2023 8:36 AM MRN: [id number] Endoscopist: Wilhemina Bonito. Marina Goodell , MD, [id number] Age: 62 Referring MD:  Date of Birth: [birth year] Gender: Female Account #: [id number] Procedure:                Colonoscopy with cold snare polypectomy x 3 Indications:              High risk colon cancer surveillance: Personal                            history of adenoma (10 mm or greater in size), High                            risk colon cancer surveillance: Personal history of                            adenoma with villous component, High risk colon                            cancer surveillance: Personal history of multiple                            (3 or more) adenomas. Previous examinations 2011,                            2014, 2019 Medicines:                Monitored Anesthesia Care Procedure:                Pre-Anesthesia Assessment:                           - Prior to the procedure, a History and Physical                            was performed, and patient medications and                            allergies were reviewed. The patient's tolerance of                            previous anesthesia was also reviewed. The risks                            and benefits of the procedure and the sedation                            options and risks were discussed with the patient.                            All questions were answered, and informed consent                            was obtained. Prior Anticoagulants: The patient has  taken no anticoagulant or antiplatelet agents. ASA                            Grade Assessment: II - A patient with mild systemic                            disease. After reviewing the risks and benefits,                            the patient was deemed in satisfactory condition to                            undergo the procedure.                           After obtaining  informed consent, the colonoscope                            was passed under direct vision. Throughout the                            procedure, the patient's blood pressure, pulse, and                            oxygen saturations were monitored continuously. The                            CF [id number] #[id number] was introduced through the anus                            and advanced to the the cecum, identified by                            appendiceal orifice and ileocecal valve. The                            ileocecal valve, appendiceal orifice, and rectum                            were photographed. The quality of the bowel                            preparation was excellent. The colonoscopy was                            performed without difficulty. The patient tolerated                            the procedure well. The bowel preparation used was                            SUPREP via split dose instruction. Scope In: 8:46:57 AM Scope Out: 9:06:02 AM Scope Withdrawal Time: 0 hours 15 minutes 9 seconds  Total Procedure Duration:  0 hours 19 minutes 5 seconds  Findings:                 Three polyps were found in the transverse colon and                            ascending colon. The polyps were 2 to 5 mm in size.                            These polyps were removed with a cold snare.                            Resection and retrieval were complete.                           Multiple diverticula were found in the entire colon.                           Internal hemorrhoids were found during retroflexion.                           The exam was otherwise without abnormality on                            direct and retroflexion views. Complications:            No immediate complications. Estimated blood loss:                            None. Estimated Blood Loss:     Estimated blood loss: none. Impression:               - Three 2 to 5 mm polyps in the transverse colon                             and in the ascending colon, removed with a cold                            snare. Resected and retrieved.                           - Diverticulosis in the entire examined colon.                           - Internal hemorrhoids.                           - The examination was otherwise normal on direct                            and retroflexion views. Recommendation:           - Repeat colonoscopy in 5 years for surveillance.                           - Patient has a contact number available for  emergencies. The signs and symptoms of potential                            delayed complications were discussed with the                            patient. Return to normal activities tomorrow.                            Written discharge instructions were provided to the                            patient.                           - Resume previous diet.                           - Continue present medications.                           - Await pathology results. Wilhemina Bonito. Marina Goodell, MD 04/21/2023 9:15:24 AM This report has been signed electronically.

## 2023-04-22 ENCOUNTER — Telehealth: Payer: Self-pay

## 2023-04-22 NOTE — Telephone Encounter (Signed)
Left message on answering machine. 

## 2023-04-23 ENCOUNTER — Other Ambulatory Visit: Payer: Self-pay | Admitting: Family Medicine

## 2023-04-23 ENCOUNTER — Encounter: Payer: Self-pay | Admitting: Internal Medicine

## 2023-04-23 DIAGNOSIS — I10 Essential (primary) hypertension: Secondary | ICD-10-CM

## 2023-04-25 ENCOUNTER — Other Ambulatory Visit: Payer: Self-pay | Admitting: Family Medicine

## 2023-05-24 ENCOUNTER — Other Ambulatory Visit: Payer: Self-pay | Admitting: Family Medicine

## 2023-06-13 ENCOUNTER — Other Ambulatory Visit: Payer: Self-pay | Admitting: Family Medicine

## 2023-06-13 DIAGNOSIS — I10 Essential (primary) hypertension: Secondary | ICD-10-CM

## 2023-06-29 ENCOUNTER — Encounter: Payer: Self-pay | Admitting: Family Medicine

## 2023-06-29 ENCOUNTER — Ambulatory Visit: Payer: 59 | Admitting: Family Medicine

## 2023-06-29 VITALS — BP 138/88 | Temp 98.3°F | Wt 264.2 lb

## 2023-06-29 DIAGNOSIS — G629 Polyneuropathy, unspecified: Secondary | ICD-10-CM

## 2023-06-29 DIAGNOSIS — Z23 Encounter for immunization: Secondary | ICD-10-CM

## 2023-06-29 MED ORDER — TOPIRAMATE 50 MG PO TABS
50.0000 mg | ORAL_TABLET | Freq: Every day | ORAL | 2 refills | Status: DC
Start: 2023-06-29 — End: 2023-09-24

## 2023-06-29 MED ORDER — AMITRIPTYLINE HCL 50 MG PO TABS: 100.0000 mg | ORAL_TABLET | Freq: Every day | ORAL | Status: AC

## 2023-06-29 NOTE — Addendum Note (Signed)
Addended by: Elwin Mocha on: 06/29/2023 01:37 PM   Modules accepted: Orders

## 2023-06-29 NOTE — Progress Notes (Signed)
Subjective:    Patient ID: Joie Bimler, female    DOB: 1961-07-17, 62 y.o.   MRN: [id number]  HPI Here complaining of numbness and tingling in both legs, primarily at night. She sees Dr. Vear Clock for pain management, and he has been giving her Amitriptyline for this. They recently increased the dose of this to 100 mg, but it has not helped. She cannot take Gabapentin or Pregabalin due to side effects.    Review of Systems  Constitutional: Negative.   Respiratory: Negative.    Cardiovascular: Negative.   Musculoskeletal:  Positive for back pain.  Neurological:  Positive for numbness.       Objective:   Physical Exam Constitutional:      Comments: Walks with a cane   Cardiovascular:     Rate and Rhythm: Normal rate and regular rhythm.     Pulses: Normal pulses.     Heart sounds: Normal heart sounds.  Pulmonary:     Effort: Pulmonary effort is normal.     Breath sounds: Normal breath sounds.  Neurological:     Mental Status: She is alert and oriented to person, place, and time. Mental status is at baseline.           Assessment & Plan:  Neuropathy, we will add Topiramate 50 mg at bedtime to her current regime. She will report back in a few weeks.  Gershon Crane, MD

## 2023-06-30 ENCOUNTER — Emergency Department (HOSPITAL_COMMUNITY)
Admission: EM | Admit: 2023-06-30 | Discharge: 2023-06-30 | Disposition: A | Discharge status: Home / Self Care | Payer: 59 | Attending: Emergency Medicine | Admitting: Emergency Medicine

## 2023-06-30 ENCOUNTER — Encounter (HOSPITAL_COMMUNITY): Payer: Self-pay | Admitting: Emergency Medicine

## 2023-06-30 ENCOUNTER — Other Ambulatory Visit: Payer: Self-pay

## 2023-06-30 DIAGNOSIS — E876 Hypokalemia: Secondary | ICD-10-CM | POA: Diagnosis present

## 2023-06-30 DIAGNOSIS — Z79899 Other long term (current) drug therapy: Secondary | ICD-10-CM | POA: Diagnosis not present

## 2023-06-30 DIAGNOSIS — R200 Anesthesia of skin: Secondary | ICD-10-CM | POA: Diagnosis not present

## 2023-06-30 DIAGNOSIS — Z91040 Latex allergy status: Secondary | ICD-10-CM | POA: Diagnosis not present

## 2023-06-30 DIAGNOSIS — E119 Type 2 diabetes mellitus without complications: Secondary | ICD-10-CM | POA: Diagnosis not present

## 2023-06-30 DIAGNOSIS — Z7984 Long term (current) use of oral hypoglycemic drugs: Secondary | ICD-10-CM | POA: Insufficient documentation

## 2023-06-30 DIAGNOSIS — Z853 Personal history of malignant neoplasm of breast: Secondary | ICD-10-CM | POA: Insufficient documentation

## 2023-06-30 DIAGNOSIS — I10 Essential (primary) hypertension: Secondary | ICD-10-CM | POA: Diagnosis not present

## 2023-06-30 LAB — BASIC METABOLIC PANEL
Anion gap: 18 — ABNORMAL HIGH (ref 5–15)
BUN: 11 mg/dL (ref 8–23)
CO2: 30 mmol/L (ref 22–32)
Calcium: 8.9 mg/dL (ref 8.9–10.3)
Chloride: 95 mmol/L — ABNORMAL LOW (ref 98–111)
Creatinine, Ser: 1.33 mg/dL — ABNORMAL HIGH (ref 0.44–1.00)
GFR, Estimated: 45 mL/min — ABNORMAL LOW (ref >60)
Glucose, Bld: 136 mg/dL — ABNORMAL HIGH (ref 70–99)
Potassium: 2.5 mmol/L — CL (ref 3.5–5.1)
Sodium: 143 mmol/L (ref 135–145)

## 2023-06-30 LAB — CBC
HCT: 39 % (ref 36.0–46.0)
Hemoglobin: 12.8 g/dL (ref 12.0–15.0)
MCH: 28.6 pg (ref 26.0–34.0)
MCHC: 32.8 g/dL (ref 30.0–36.0)
MCV: 87.2 fL (ref 80.0–100.0)
Platelets: 294 10*3/uL (ref 150–400)
RBC: 4.47 MIL/uL (ref 3.87–5.11)
RDW: 13.9 % (ref 11.5–15.5)
WBC: 7.6 10*3/uL (ref 4.0–10.5)
nRBC: 0 % (ref 0.0–0.2)

## 2023-06-30 LAB — MAGNESIUM: Magnesium: 1.5 mg/dL — ABNORMAL LOW (ref 1.7–2.4)

## 2023-06-30 MED ORDER — MAGNESIUM 30 MG PO TABS: 30.0000 mg | ORAL_TABLET | Freq: Every day | ORAL | 0 refills | Status: DC

## 2023-06-30 MED ORDER — POTASSIUM CHLORIDE 10 MEQ/100ML IV SOLN
10.0000 meq | Freq: Once | INTRAVENOUS | Status: AC
  Administered 2023-06-30: 10 meq via INTRAVENOUS
  Filled 2023-06-30: qty 100

## 2023-06-30 MED ORDER — POTASSIUM CHLORIDE CRYS ER 20 MEQ PO TBCR
40.0000 meq | EXTENDED_RELEASE_TABLET | Freq: Once | ORAL | Status: AC
  Administered 2023-06-30: 40 meq via ORAL
  Filled 2023-06-30: qty 2

## 2023-06-30 NOTE — Discharge Instructions (Addendum)
Increase your potassium to 2 pills a day.  Stop Topamax for now.

## 2023-06-30 NOTE — ED Triage Notes (Signed)
Patient states she started a new medication for neuropathy yesterday and woke up this morning and her face, both hands, and both feet are numb this morning.

## 2023-06-30 NOTE — ED Provider Triage Note (Signed)
Emergency Medicine Provider Triage Evaluation Note  [name] , a 62 y.o. female  was evaluated in triage.  Pt complains of paresthesias of face and hands.  Patient admits that she started a new medication for neuropathy yesterday, has had 1 dose.  She has baseline paresthesias in her hands but feels like she is developing perioral and upper forehead/cheeks, bilaterally.  She states that this happens when her potassium drops low.  She does take a potassium supplement daily, she is compliant.  Denies any chest pain or palpitations.  Cannot recall the name of the new medication.  Review of Systems  Positive: Acute on chronic paresthesias of her bilateral hands, paresthesias periorally in the forehead/cheeks Negative: Chest pain, palpitations, shortness of breath, vomiting/diarrhea  Physical Exam  BP 131/82 (BP Location: Left Arm)   Pulse 85   Temp 98.6 F (37 C)   Resp 16   SpO2 95%  Gen:   Awake, no distress   Resp:  Normal effort  MSK:   Moves extremities without difficulty  Other:  Subjective paresthesias involving her face and hands however her sensory exam is intact, no acute neurodeficit  Medical Decision Making  Medically screening exam initiated at 2:41 PM.  Appropriate orders placed.  Joie Bimler was informed that the remainder of the evaluation will be completed by another provider, this initial triage assessment does not replace that evaluation, and the importance of remaining in the ED until their evaluation is complete.  Labs ordered to evaluate, further workup pending results.  Paresthesias are acute on chronic, sound more metabolic, not acute or unilateral or stroke light.  Patient stable for waiting room.   Rozelle Logan, DO 06/30/23 1445

## 2023-06-30 NOTE — ED Provider Notes (Signed)
[place] EMERGENCY DEPARTMENT AT [facility] Provider Note   CSN: [id number] Arrival date & time: 06/30/23  1311     History  Chief Complaint  Patient presents with   Numbness    [name] is a 62 y.o. female.  HPI Patient presents with numbness in her face and both hands.  Has had with hypokalemia in the past.  Also has a chronic neuropathy.  Started on Topamax yesterday by PCP.  States developed symptoms today.  Not weak but states it feels numb.  No chest pain.  No trouble breathing.  Is on potassium supplementation at home.   Past Medical History:  Diagnosis Date   Breast cancer (HCC) 2017   right breast   Breast cancer of lower-inner quadrant of right female breast (HCC) 08/08/2015   Colon polyps    DDD (degenerative disc disease), cervical    Degenerative joint disease    Diabetes mellitus without complication (HCC)    Diverticulosis    Gallstones    GERD (gastroesophageal reflux disease)    Headache(784.0)    migraine like per patient   Hypertension    Low back pain    dr Vear Clock, pain management   Osteoarthritis    Personal history of chemotherapy 2017   Personal history of radiation therapy 2017    Home Medications Prior to Admission medications   Medication Sig Start Date End Date Taking? Authorizing Provider  amitriptyline (ELAVIL) 50 MG tablet Take 2 tablets (100 mg total) by mouth at bedtime. 06/29/23  Yes Nelwyn Salisbury, MD  amLODipine (NORVASC) 10 MG tablet TAKE 1 TABLET BY MOUTH EVERY DAY 04/23/23  Yes Nelwyn Salisbury, MD  magnesium 30 MG tablet Take 1 tablet (30 mg total) by mouth daily. 06/30/23  Yes Benjiman Core, MD  metFORMIN (GLUCOPHAGE) 500 MG tablet TAKE 1 TABLET BY MOUTH 2 TIMES DAILY WITH A MEAL. 05/27/23  Yes Nelwyn Salisbury, MD  methadone (DOLOPHINE) 10 MG tablet Take 10 mg by mouth every 8 (eight) hours.  05/24/19  Yes [provider]  methocarbamol (ROBAXIN) 750 MG tablet Take 750 mg by mouth. 07/20/20  Yes  [provider]  metoprolol tartrate (LOPRESSOR) 50 MG tablet TAKE 1 TABLET BY MOUTH TWICE A DAY 04/23/23  Yes Nelwyn Salisbury, MD  omeprazole (PRILOSEC) 40 MG capsule TAKE 1 CAPSULE BY MOUTH EVERY DAY 04/27/23  Yes Nelwyn Salisbury, MD  Oxycodone HCl 10 MG TABS Take 1 tablet by mouth every six hours as needed for pain 01/24/22  Yes   polyvinyl alcohol (LIQUIFILM TEARS) 1.4 % ophthalmic solution Place 1 drop into both eyes as needed.   Yes [provider]  potassium chloride SA (KLOR-CON M20) 20 MEQ tablet Take 1 tablet (20 mEq total) by mouth daily. 11/12/22  Yes Nelwyn Salisbury, MD  topiramate (TOPAMAX) 50 MG tablet Take 1 tablet (50 mg total) by mouth at bedtime. 06/29/23  Yes Nelwyn Salisbury, MD  Vitamin D, Ergocalciferol, (DRISDOL) 1.25 MG (50000 UNIT) CAPS capsule Take 1 capsule (50,000 Units total) by mouth every 7 (seven) days. 08/21/22  Yes Nelwyn Salisbury, MD  zolpidem (AMBIEN) 10 MG tablet TAKE 2 TABLETS (20 MG TOTAL) BY MOUTH AT BEDTIME. INS ONLY PAYS ONE DAILY Patient taking differently: Take 20 mg by mouth at bedtime as needed for sleep. 04/28/23  Yes Nelwyn Salisbury, MD  ciprofloxacin (CIPRO) 500 MG tablet Take 1 tablet (500 mg total) by mouth 2 (two) times daily. 03/09/23  Nelwyn Salisbury, MD  glucose blood [facility] ULTRA) test strip USE TO TEST ONCE DAILY 12/01/22   Nelwyn Salisbury, MD  OneTouch Delica Lancets 33G MISC USE TO TEST ONCE DAILY DX CODE E11.9 05/11/20   Nelwyn Salisbury, MD  SODIUM FLUORIDE 5000 SENSITIVE 1.1-5 % GEL Take 1 Application by mouth in the morning and at bedtime. 02/24/23   [provider]  lisinopril (ZESTRIL) 10 MG tablet TAKE 1 TABLET BY MOUTH EVERY DAY Patient taking differently: Take 10 mg by mouth daily.  02/02/20 04/12/20  Nelwyn Salisbury, MD      Allergies    Lisinopril, Codeine, Latex, Sulfonamide derivatives, Gabapentin, and Lyrica [pregabalin]    Review of Systems   Review of Systems  Physical Exam Updated Vital Signs BP (!) 142/86    Pulse 79   Temp 98.1 F (36.7 C)   Resp 18   SpO2 97%  Physical Exam Vitals and nursing note reviewed.  Cardiovascular:     Rate and Rhythm: Regular rhythm.  Abdominal:     Tenderness: There is no abdominal tenderness.  Musculoskeletal:     Cervical back: Neck supple.  Neurological:     Mental Status: She is alert.     ED Results / Procedures / Treatments   Labs (all labs ordered are listed, but only abnormal results are displayed) Labs Reviewed  BASIC METABOLIC PANEL - Abnormal; Notable for the following components:      Result Value   Potassium 2.5 (*)    Chloride 95 (*)    Glucose, Bld 136 (*)    Creatinine, Ser 1.33 (*)    GFR, Estimated 45 (*)    Anion gap 18 (*)    All other components within normal limits  MAGNESIUM - Abnormal; Notable for the following components:   Magnesium 1.5 (*)    All other components within normal limits  CBC    EKG EKG Interpretation Date/Time:  Tuesday June 30 2023 14:08:19 EDT Ventricular Rate:  79 PR Interval:  166 QRS Duration:  94 QT Interval:  360 QTC Calculation: 412 R Axis:   34  Text Interpretation: Normal sinus rhythm ST & T wave abnormality, consider inferolateral ischemia Abnormal ECG When compared with ECG of 27-Sep-2021 12:52, PREVIOUS ECG IS PRESENT similar to previous, normal intervals Confirmed by Coralee Pesa [phone]) on 06/30/2023 2:25:14 PM  Radiology No results found.  Procedures Procedures    Medications Ordered in ED Medications  potassium chloride SA (KLOR-CON M) CR tablet 40 mEq (40 mEq Oral Given 06/30/23 1426)  potassium chloride 10 mEq in 100 mL IVPB (0 mEq Intravenous Stopped 06/30/23 1858)    ED Course/ Medical Decision Making/ A&P                                 Medical Decision Making Amount and/or Complexity of Data Reviewed Labs: ordered.  Risk OTC drugs. Prescription drug management.   Patient with strange feeling on his on forehead and extremities.  I think most likely  secondary to new medication.  However does have hypokalemia hypomagnesemia.  Will supplement.  I doubt stroke.  Will talk with prescribing doctor about her Topamax.  Appears stable for discharge home.        Final Clinical Impression(s) / ED Diagnoses Final diagnoses:  Hypokalemia  Hypomagnesemia    Rx / DC Orders ED Discharge Orders          Ordered  magnesium 30 MG tablet  Daily        06/30/23 1852              Benjiman Core, MD 06/30/23 2311

## 2023-07-11 ENCOUNTER — Other Ambulatory Visit: Payer: Self-pay | Admitting: Family Medicine

## 2023-07-20 ENCOUNTER — Encounter: Payer: Self-pay | Admitting: Family Medicine

## 2023-07-20 ENCOUNTER — Ambulatory Visit: Payer: 59 | Admitting: Family Medicine

## 2023-07-20 VITALS — BP 118/74 | Temp 98.6°F | Wt 268.0 lb

## 2023-07-20 DIAGNOSIS — N390 Urinary tract infection, site not specified: Secondary | ICD-10-CM

## 2023-07-20 LAB — POC URINALSYSI DIPSTICK (AUTOMATED)
Bilirubin, UA: NEGATIVE
Glucose, UA: NEGATIVE
Ketones, UA: NEGATIVE
Leukocytes, UA: NEGATIVE
Nitrite, UA: NEGATIVE
Protein, UA: POSITIVE — AB
Spec Grav, UA: 1.015 (ref 1.010–1.025)
Urobilinogen, UA: 0.2 U/dL
pH, UA: 6 (ref 5.0–8.0)

## 2023-07-20 MED ORDER — CIPROFLOXACIN HCL 500 MG PO TABS: 500.0000 mg | ORAL_TABLET | Freq: Two times a day (BID) | ORAL | 0 refills | Status: DC

## 2023-07-20 NOTE — Addendum Note (Signed)
Addended by: Carola Rhine on: 07/20/2023 11:16 AM   Modules accepted: Orders

## 2023-07-20 NOTE — Progress Notes (Signed)
Subjective:    Patient ID: [name], female    DOB: 09/12/1961, 62 y.o.   MRN: [id number]  HPI Here for one week of RLQ pains that come and go. She also has urinary urgency and burning. She has some nausea without vomiting. No fever. Her BM's are regular. She was treated for a UTI in June with Cipro, and her symptoms went away. The urine culture was negative at that time.    Review of Systems  Constitutional: Negative.   Respiratory: Negative.    Cardiovascular: Negative.   Gastrointestinal:  Positive for abdominal pain and nausea. Negative for abdominal distention, blood in stool, constipation, diarrhea and vomiting.  Genitourinary:  Positive for dysuria, frequency and urgency. Negative for flank pain, hematuria and pelvic pain.       Objective:   Physical Exam Constitutional:      Appearance: Normal appearance. She is not ill-appearing.  Cardiovascular:     Rate and Rhythm: Normal rate and regular rhythm.     Pulses: Normal pulses.     Heart sounds: Normal heart sounds.  Pulmonary:     Effort: Pulmonary effort is normal.     Breath sounds: Normal breath sounds.  Abdominal:     General: Abdomen is flat. Bowel sounds are normal. There is no distension.     Palpations: There is no mass.     Tenderness: There is no guarding.     Hernia: No hernia is present.     Comments: She is mildly tender across the entire lower abdomen   Neurological:     Mental Status: She is alert.           Assessment & Plan:  UTI, treat with 7 days of Cipro. Culture the sample.  Gershon Crane, MD

## 2023-07-22 LAB — URINE CULTURE
MICRO NUMBER:: 15652049
Result:: NO GROWTH
SPECIMEN QUALITY:: ADEQUATE

## 2023-07-29 ENCOUNTER — Telehealth: Payer: Self-pay | Admitting: Family Medicine

## 2023-07-29 NOTE — Telephone Encounter (Addendum)
Pt is returning [name] call concerning lab

## 2023-07-30 NOTE — Telephone Encounter (Signed)
Left pt a message to call the office back regarding this

## 2023-07-31 NOTE — Telephone Encounter (Signed)
Reviewed urine results with pt.

## 2023-08-07 ENCOUNTER — Other Ambulatory Visit: Payer: Self-pay | Admitting: Family Medicine

## 2023-08-07 NOTE — Telephone Encounter (Signed)
Ok to refill this Rx

## 2023-08-25 ENCOUNTER — Other Ambulatory Visit: Payer: Self-pay | Admitting: Anesthesiology

## 2023-08-25 ENCOUNTER — Ambulatory Visit
Admission: RE | Admit: 2023-08-25 | Discharge: 2023-08-25 | Disposition: A | Discharge status: Home / Self Care | Payer: 59 | Source: Ambulatory Visit | Attending: Anesthesiology | Admitting: Anesthesiology

## 2023-08-25 ENCOUNTER — Other Ambulatory Visit: Payer: Self-pay | Admitting: Family Medicine

## 2023-08-25 DIAGNOSIS — M25512 Pain in left shoulder: Secondary | ICD-10-CM

## 2023-08-25 DIAGNOSIS — I10 Essential (primary) hypertension: Secondary | ICD-10-CM

## 2023-09-17 ENCOUNTER — Other Ambulatory Visit: Payer: Self-pay | Admitting: Family Medicine

## 2023-09-17 DIAGNOSIS — I10 Essential (primary) hypertension: Secondary | ICD-10-CM

## 2023-09-22 ENCOUNTER — Other Ambulatory Visit: Payer: Self-pay | Admitting: Family Medicine

## 2023-09-22 DIAGNOSIS — E119 Type 2 diabetes mellitus without complications: Secondary | ICD-10-CM

## 2023-09-22 NOTE — Telephone Encounter (Signed)
 Copied from CRM [id number]. Topic: Clinical - Medication Refill >> Sep 22, 2023  1:33 PM Mercedes MATSU wrote: Most Recent Primary Care Visit:  Provider: JOHNNY SENIOR A  Department: [facility]  Visit Type: OFFICE VISIT  Date: 07/20/2023  Medication: OneTouch Delica Lancets 33G MISC, ONETOUCH ULTRA test strip  Has the patient contacted their pharmacy? Yes (Agent: If no, request that the patient contact the pharmacy for the refill. If patient does not wish to contact the pharmacy document the reason why and proceed with request.) (Agent: If yes, when and what did the pharmacy advise?)  Is this the correct pharmacy for this prescription? Yes If no, delete pharmacy and type the correct one.  This is the patient's preferred pharmacy:  [facility] [address] [place] - [address] [place] [address] Phone: [phone] Fax: [phone]   Has the prescription been filled recently? No  Is the patient out of the medication? Yes  Has the patient been seen for an appointment in the last year OR does the patient have an upcoming appointment? Yes  Can we respond through MyChart? Yes  Agent: Please be advised that Rx refills may take up to 3 business days. We ask that you follow-up with your pharmacy.

## 2023-09-23 ENCOUNTER — Other Ambulatory Visit: Payer: Self-pay | Admitting: Family Medicine

## 2023-09-24 ENCOUNTER — Telehealth: Payer: Self-pay | Admitting: *Deleted

## 2023-09-24 NOTE — Telephone Encounter (Signed)
 Copied from CRM [id number]. Topic: Clinical - Prescription Issue >> Sep 22, 2023  1:36 PM Mercedes MATSU wrote: Reason for CRM: Patient called in stating that her One Touch Glucose Device no longer works and she was told by her pharmacy to contact her provider. I sent a prescription request as well.

## 2023-09-25 ENCOUNTER — Other Ambulatory Visit: Payer: Self-pay

## 2023-09-25 ENCOUNTER — Telehealth: Payer: Self-pay | Admitting: *Deleted

## 2023-09-25 MED ORDER — ONETOUCH ULTRA 2 W/DEVICE KIT: PACK | 0 refills | Status: AC

## 2023-09-25 NOTE — Telephone Encounter (Signed)
 Copied from CRM [id number]. Topic: Clinical - Medical Advice >> Sep 24, 2023  4:01 PM Tiffany H wrote: Reason for CRM: Patient called to advise that she has been experiencing numbness from top of left arm to finger for the past two weeks. There's a tingling. Scheduled patient for 09/28/23. Please follow up if necessary due to symptoms.

## 2023-09-25 NOTE — Telephone Encounter (Signed)
 Rx for blood glucose supplies sent to pt pharmacy, pt notified

## 2023-09-25 NOTE — Telephone Encounter (Signed)
 Noted.

## 2023-09-28 ENCOUNTER — Ambulatory Visit (INDEPENDENT_AMBULATORY_CARE_PROVIDER_SITE_OTHER): Payer: Self-pay | Admitting: Family Medicine

## 2023-09-28 VITALS — BP 128/82 | Temp 98.1°F | Ht 67.0 in | Wt 264.0 lb

## 2023-09-28 DIAGNOSIS — R202 Paresthesia of skin: Secondary | ICD-10-CM

## 2023-09-28 MED ORDER — ONETOUCH DELICA LANCETS 33G MISC: 1.0000 | Freq: Every day | 0 refills | Status: AC

## 2023-09-28 MED ORDER — ONETOUCH ULTRA VI STRP: ORAL_STRIP | 1 refills | Status: AC

## 2023-09-28 NOTE — Progress Notes (Signed)
 Established Patient Office Visit  Subjective   Patient ID: [name], female    DOB: Jan 15, 1961  Age: 63 y.o. MRN: [id number]  Chief Complaint  Patient presents with   Numbness    HPI   Donaghey has history of hypertension, GERD, type 2 diabetes, history of carpal tunnel syndrome, spondylosis lumbar spine, osteoarthritis involving multiple joints, gout, past history of right breast cancer.  She has had previous lumbar fusion.  She is seen now with 2-week history of paresthesias and numbness involving left upper extremity.  Frequent sensation of pins-and-needles .  Some lower left-sided cervical neck pains.  She has had carpal tunnel in the past but states the symptoms are different especially involving the numbness of the arm region.  Occasional dropping things.  Denies any lower extremity symptoms.  She had history of cervical spine films 8/23 which showed spondylosis especially C5-6  Past Medical History:  Diagnosis Date   Breast cancer (HCC) 2017   right breast   Breast cancer of lower-inner quadrant of right female breast (HCC) 08/08/2015   Colon polyps    DDD (degenerative disc disease), cervical    Degenerative joint disease    Diabetes mellitus without complication (HCC)    Diverticulosis    Gallstones    GERD (gastroesophageal reflux disease)    Headache(784.0)    migraine like per patient   Hypertension    Low back pain    dr orlando, pain management   Osteoarthritis    Personal history of chemotherapy 2017   Personal history of radiation therapy 2017   Past Surgical History:  Procedure Laterality Date   CARPAL TUNNEL RELEASE Right 09/30/2021   Procedure: Right CARPAL TUNNEL RELEASE;  Surgeon: Romona Harari, MD;  Location: [facility];  Service: Orthopedics;  Laterality: Right;   CHOLECYSTECTOMY  10/28/2011   Procedure: LAPAROSCOPIC CHOLECYSTECTOMY WITH INTRAOPERATIVE CHOLANGIOGRAM;  Surgeon: Krystal CHRISTELLA Spinner, MD;  Location: [facility];   Service: General;  Laterality: N/A;   COLONOSCOPY WITH ESOPHAGOGASTRODUODENOSCOPY (EGD)  04/13/2018   per Dr. Abran, adenomatous polyp, repeat in 5 yrs    EXAM UNDER ANESTHESIA WITH MANIPULATION OF KNEE Right 05/30/2003   EXAM UNDER ANESTHESIA WITH MANIPULATION OF KNEE Left 12/27/2002   GANGLION CYST EXCISION Right    right wrist   KNEE ARTHROSCOPY Bilateral 01/02/2000   KNEE ARTHROSCOPY Right    LAPAROSCOPIC VAGINAL HYSTERECTOMY WITH SALPINGO OOPHORECTOMY Bilateral 07/13/2006   LIPOMA EXCISION Right 02/21/2008   hip   MASS EXCISION  08/24/2012   Procedure: EXCISION MASS;  Surgeon: Krystal CHRISTELLA Spinner, MD;  Location: [facility];  Service: General;  Laterality: Left;  excisie soft tissue masses left shoulder(back) & left upper arm   MASS EXCISION Left 01/24/2014   Procedure: EXCISION SOFT TISSUE MASSES LOWER LEFT BACK;  Surgeon: Krystal CHRISTELLA Spinner, MD;  Location: [facility];  Service: General;  Laterality: Left;   MASS EXCISION Right 04/30/2016   Procedure: EXCISION OF SKIN RIGHT CHEST WALL;  Surgeon: Deward Null III, MD;  Location: [facility];  Service: General;  Laterality: Right;  EXCISION OF SKIN RIGHT CHEST WALL   MASTECTOMY Right 2017   MASTECTOMY W/ SENTINEL NODE BIOPSY Right 10/01/2015   Procedure: RIGHT MASTECTOMY WITH SENTINEL LYMPH NODE BIOPSY;  Surgeon: Deward Null III, MD;  Location: [facility];  Service: General;  Laterality: Right;   PORT-A-CATH REMOVAL Left 04/30/2016   Procedure: REMOVAL PORT-A-CATH;  Surgeon: Deward Null III, MD;  Location: [facility];  Service: General;  Laterality: Left;  REMOVAL PORT-A-CATH   PORTACATH PLACEMENT Left 11/01/2015   Procedure: INSERTION PORT-A-CATH;  Surgeon: Deward Null III, MD;  Location:  SURGERY CENTER;  Service: General;  Laterality: Left;   POSTERIOR FUSION LUMBAR SPINE  07/13/2018   per Dr. Lucilla, at L4-5 and L5-S1   REPLACEMENT UNICONDYLAR JOINT KNEE Left 04/20/2001   REVISION TOTAL  KNEE ARTHROPLASTY Right 06/18/2004   REVISION TOTAL KNEE ARTHROPLASTY Left 11/15/2002   REVISION TOTAL KNEE ARTHROPLASTY Left 10/12/2001   REVISION TOTAL KNEE ARTHROPLASTY Right 07/09/2015   TONSILLECTOMY     TOTAL KNEE ARTHROPLASTY Right 04/25/2003   TOTAL KNEE ARTHROPLASTY Left 06/25/2009    reports that she has been smoking cigarettes. She started smoking about 23 years ago. She has a 5 pack-year smoking history. She quit smokeless tobacco use about 8 years ago. She reports that she does not drink alcohol and does not use drugs. family history includes Colon cancer in her maternal grandmother; Crohn's disease in an other family member; Heart disease in her father; Hypertension in her brother and sister; Kidney cancer in her sister; Prostate cancer in her father; Stomach cancer in her mother. Allergies  Allergen Reactions   Lisinopril  Shortness Of Breath and Swelling    Angioedema   Codeine Itching, Nausea And Vomiting and Other (See Comments)    Itching all over the body   Latex Hives   Sulfonamide Derivatives Hives and Other (See Comments)    All over the body   Gabapentin  Other (See Comments)    headache   Lyrica  [Pregabalin ] Other (See Comments)    headache    Review of Systems  Constitutional:  Negative for chills and fever.  Respiratory:  Negative for shortness of breath.   Cardiovascular:  Negative for chest pain.  Musculoskeletal:  Positive for neck pain.  Neurological:  Positive for tingling and sensory change. Negative for focal weakness and loss of consciousness.      Objective:     BP 128/82   Temp 98.1 F (36.7 C) (Oral)   Ht 5' 7 (1.702 m)   Wt 264 lb (119.7 kg)   BMI 41.35 kg/m  BP Readings from Last 3 Encounters:  09/28/23 128/82  07/20/23 118/74  06/30/23 (!) 142/86   Wt Readings from Last 3 Encounters:  09/28/23 264 lb (119.7 kg)  07/20/23 268 lb (121.6 kg)  06/29/23 264 lb 3.2 oz (119.8 kg)      Physical Exam Vitals reviewed.   Constitutional:      General: She is not in acute distress. Neck:     Comments: Mild tenderness to palpation left lower cervical spine region.  Somewhat limited range of motion with lateral bending or rotation to the right or left.  Some pain with lateral rotation to the right and left side Cardiovascular:     Rate and Rhythm: Normal rate and regular rhythm.  Pulmonary:     Effort: Pulmonary effort is normal.     Breath sounds: Normal breath sounds.  Neurological:     General: No focal deficit present.     Mental Status: She is alert and oriented to person, place, and time.     Cranial Nerves: No cranial nerve deficit.     Motor: No weakness.     Coordination: Coordination normal.     Gait: Gait normal.     Comments: She has full grip strength bilaterally.  Difficult to elicit deep tendon reflexes either upper extremity.  Impaired sensory  function to monofilament testing subjectively on the left arm and forearm compared to the right.  No obvious muscle atrophy.      No results found for any visits on 09/28/23.  Last CBC Lab Results  Component Value Date   WBC 7.6 06/30/2023   HGB 12.8 06/30/2023   HCT 39.0 06/30/2023   MCV 87.2 06/30/2023   MCH 28.6 06/30/2023   RDW 13.9 06/30/2023   PLT 294 06/30/2023   Last metabolic panel Lab Results  Component Value Date   GLUCOSE 136 (H) 06/30/2023   NA 143 06/30/2023   K 2.5 (LL) 06/30/2023   CL 95 (L) 06/30/2023   CO2 30 06/30/2023   BUN 11 06/30/2023   CREATININE 1.33 (H) 06/30/2023   GFRNONAA 45 (L) 06/30/2023   CALCIUM 8.9 06/30/2023   PHOS 4.4 01/30/2012   PROT 7.9 08/12/2022   ALBUMIN  4.8 08/12/2022   BILITOT 0.4 08/12/2022   ALKPHOS 90 08/12/2022   AST 22 08/12/2022   ALT 14 08/12/2022   ANIONGAP 18 (H) 06/30/2023   Last hemoglobin A1c Lab Results  Component Value Date   HGBA1C 7.7 (H) 08/12/2022      The 10-year ASCVD risk score (Arnett DK, et al., 2019) is: 33.2%    Assessment & Plan:   Problem List  Items Addressed This Visit   None Visit Diagnoses       Paresthesia of left arm    -  Primary     Patient presents with 2-week history of persistent paresthesia involving left upper extremity.  She does not describe any recent strokelike symptoms.  The symptoms are qualitatively different than her previous carpal tunnel.  Question cervical nerve impingement.  -Set up MRI cervical spine to further assess -Follow-up immediately for any progressive weakness or new symptoms  No follow-ups on file.    Wolm Scarlet, MD

## 2023-09-28 NOTE — Patient Instructions (Addendum)
 I will be setting up MRI of cervical spine to further assess.  Set up follow with Dr Clent Ridges soon to follow up diabetes.

## 2023-10-05 ENCOUNTER — Telehealth: Payer: Self-pay

## 2023-10-05 NOTE — Telephone Encounter (Signed)
 Spoke with pt provided the phone number to [facility], advised to call them in the morning for scheduling. Pt verbalized understanding

## 2023-10-05 NOTE — Telephone Encounter (Signed)
 Copied from CRM [id number]. Topic: Clinical - Lab/Test Results >> Oct 05, 2023  4:05 PM Truddie Crumble wrote: Reason for CRM: Pt called stating no one called her yet about an MRI appointment that Dr. Caryl Never ordered.

## 2023-10-08 ENCOUNTER — Other Ambulatory Visit: Payer: Self-pay | Admitting: Family Medicine

## 2023-10-08 DIAGNOSIS — I10 Essential (primary) hypertension: Secondary | ICD-10-CM

## 2023-10-11 ENCOUNTER — Other Ambulatory Visit: Payer: Self-pay

## 2023-10-15 ENCOUNTER — Ambulatory Visit
Admission: RE | Admit: 2023-10-15 | Discharge: 2023-10-15 | Disposition: A | Discharge status: Home / Self Care | Payer: BC Managed Care – PPO | Source: Ambulatory Visit | Attending: Family Medicine | Admitting: Family Medicine

## 2023-10-15 DIAGNOSIS — R202 Paresthesia of skin: Secondary | ICD-10-CM

## 2023-10-15 DIAGNOSIS — M47812 Spondylosis without myelopathy or radiculopathy, cervical region: Secondary | ICD-10-CM | POA: Diagnosis not present

## 2023-10-22 ENCOUNTER — Telehealth: Payer: Self-pay

## 2023-10-22 NOTE — Telephone Encounter (Signed)
Copied from CRM [id number]. Topic: Clinical - Lab/Test Results >> Oct 22, 2023  9:25 AM Isabell A wrote: Reason for CRM: Patient would like a callback to discuss her MRI results.

## 2023-11-17 ENCOUNTER — Telehealth: Payer: Self-pay | Admitting: Family Medicine

## 2023-11-17 NOTE — Telephone Encounter (Signed)
 Pt copy of x-ray results have been printed and placed in the front office filing cabinet for pt to pick up. Will pick up tomorrow

## 2023-11-17 NOTE — Telephone Encounter (Signed)
 Copied from CRM [id number]. Topic: Medical Record Request - Records Request >> Nov 17, 2023  8:50 AM Fonda Kinder J wrote: Reason for CRM: Pt is requesting for a copy of her MRI results, she is wanting to come in pick them up and wants to know the best time if this is possible. Please advise

## 2023-11-25 ENCOUNTER — Other Ambulatory Visit: Payer: Self-pay | Admitting: Family Medicine

## 2023-11-25 DIAGNOSIS — I10 Essential (primary) hypertension: Secondary | ICD-10-CM

## 2023-12-14 DIAGNOSIS — M150 Primary generalized (osteo)arthritis: Secondary | ICD-10-CM | POA: Diagnosis not present

## 2023-12-14 DIAGNOSIS — M47812 Spondylosis without myelopathy or radiculopathy, cervical region: Secondary | ICD-10-CM | POA: Diagnosis not present

## 2023-12-14 DIAGNOSIS — M545 Low back pain, unspecified: Secondary | ICD-10-CM | POA: Diagnosis not present

## 2023-12-14 DIAGNOSIS — M25562 Pain in left knee: Secondary | ICD-10-CM | POA: Diagnosis not present

## 2023-12-19 ENCOUNTER — Other Ambulatory Visit: Payer: Self-pay | Admitting: Family Medicine

## 2023-12-19 DIAGNOSIS — I10 Essential (primary) hypertension: Secondary | ICD-10-CM

## 2023-12-21 ENCOUNTER — Ambulatory Visit: Admitting: Family Medicine

## 2023-12-21 ENCOUNTER — Encounter: Payer: Self-pay | Admitting: Family Medicine

## 2023-12-21 VITALS — BP 130/80 | HR 76 | Temp 98.3°F | Resp 16 | Ht 67.0 in | Wt 259.0 lb

## 2023-12-21 DIAGNOSIS — H04121 Dry eye syndrome of right lacrimal gland: Secondary | ICD-10-CM | POA: Diagnosis not present

## 2023-12-21 DIAGNOSIS — H5711 Ocular pain, right eye: Secondary | ICD-10-CM | POA: Diagnosis not present

## 2023-12-21 NOTE — Progress Notes (Signed)
 ACUTE VISIT Chief Complaint  Patient presents with   Dry Eye    Right eye x a month, worse in the last 2 weeks; she is having pain the right eye now, feels like a stabbing pain.    HPI: DianeDiane [name] is a 63 y.o. female with a PMHx significant for HTN, GERD, DM II, neuropathy, chronic pain, breast cancer, and gout, among many others, who is here today with her husband with above complaints.   She complains of intermittent right eye pain for about a month. This is a new problem for her. When she wakes up, she has yellowish, crusty drainage in her eye. She says the problem was initially mild but has worsened gradually and been severe for about 2 weeks. She describes the pain as a stabbing sensation and rates it as a 9/10.   Eye Pain  The right eye is affected. This is a new problem. The current episode started 1 to 4 weeks ago. The problem occurs intermittently. The problem has been gradually worsening. The pain is severe. There is No known exposure to pink eye. She Does not wear contacts. Associated symptoms include an eye discharge. Pertinent negatives include no blurred vision, double vision, eye redness, fever, foreign body sensation, itching, nausea, photophobia, recent URI, vomiting or weakness. She has tried nothing for the symptoms.   She has appointments at [facility] and has been told she has chronic eye dryness, for which she normally uses Liquifilm Tears. She ran out of the medication about a month ago, but normally uses it twice daily.  Her last exam at [facility] was in late 2024.  Pertinent negatives include visual changes, conjunctival redness, periocular edema/erythema, headaches, nausea, vomiting, fever, chills, changes in appetite, or recent injury.    DM II: She has not seen her PCP in sometime.  Lab Results  Component Value Date   HGBA1C 7.7 (H) 08/12/2022   Lab Results  Component Value Date   TSH 2.61 08/12/2022   Review of Systems  Constitutional:  Negative  for activity change, appetite change and fever.  HENT:  Negative for mouth sores and sore throat.   Eyes:  Positive for pain and discharge. Negative for blurred vision, double vision, photophobia, redness and itching.  Gastrointestinal:  Negative for nausea and vomiting.  Endocrine: Negative for cold intolerance and heat intolerance.  Skin:  Negative for rash.  Allergic/Immunologic: Negative for environmental allergies.  Neurological:  Negative for syncope, facial asymmetry and weakness.  Psychiatric/Behavioral:  Negative for confusion and hallucinations.   See other pertinent positives and negatives in HPI.  Current Outpatient Medications on File Prior to Visit  Medication Sig Dispense Refill   amitriptyline (ELAVIL) 50 MG tablet Take 2 tablets (100 mg total) by mouth at bedtime.     amLODipine (NORVASC) 10 MG tablet TAKE 1 TABLET BY MOUTH EVERY DAY 90 tablet 0   Blood Glucose Monitoring Suppl (ONE TOUCH ULTRA 2) w/Device KIT Use to check blood glucose daily Dx Code: E11.9 1 kit 0   ciprofloxacin (CIPRO) 500 MG tablet Take 1 tablet (500 mg total) by mouth 2 (two) times daily. 14 tablet 0   glucose blood (ONETOUCH ULTRA) test strip Use as instructed 25 strip 1   magnesium 30 MG tablet Take 1 tablet (30 mg total) by mouth daily. 10 tablet 0   metFORMIN (GLUCOPHAGE) 500 MG tablet TAKE 1 TABLET BY MOUTH 2 TIMES DAILY WITH A MEAL. 180 tablet 0   methadone (DOLOPHINE) 10 MG tablet Take  10 mg by mouth every 8 (eight) hours.      methocarbamol (ROBAXIN) 750 MG tablet Take 750 mg by mouth.     metoprolol tartrate (LOPRESSOR) 50 MG tablet TAKE 1 TABLET BY MOUTH TWICE A DAY 180 tablet 0   omeprazole (PRILOSEC) 40 MG capsule TAKE 1 CAPSULE BY MOUTH EVERY DAY 90 capsule 0   OneTouch Delica Lancets 33G MISC 1 each by Does not apply route daily at 12 noon. 100 each 0   Oxycodone HCl 10 MG TABS Take 1 tablet by mouth every six hours as needed for pain 120 tablet 0   polyvinyl alcohol (LIQUIFILM TEARS)  1.4 % ophthalmic solution Place 1 drop into both eyes as needed.     potassium chloride SA (KLOR-CON M20) 20 MEQ tablet Take 1 tablet (20 mEq total) by mouth daily. 90 tablet 0   SODIUM FLUORIDE 5000 SENSITIVE 1.1-5 % GEL Take 1 Application by mouth in the morning and at bedtime.     topiramate (TOPAMAX) 50 MG tablet TAKE 1 TABLET BY MOUTH EVERYDAY AT BEDTIME 90 tablet 0   Vitamin D, Ergocalciferol, (DRISDOL) 1.25 MG (50000 UNIT) CAPS capsule TAKE 1 CAPSULE (50,000 UNITS TOTAL) BY MOUTH EVERY 7 (SEVEN) DAYS 12 capsule 3   zolpidem (AMBIEN) 10 MG tablet Take 2 tablets (20 mg total) by mouth at bedtime as needed for sleep. 90 tablet 1   [DISCONTINUED] lisinopril (ZESTRIL) 10 MG tablet TAKE 1 TABLET BY MOUTH EVERY DAY (Patient taking differently: Take 10 mg by mouth daily. ) 30 tablet 2   No current facility-administered medications on file prior to visit.    Past Medical History:  Diagnosis Date   Breast cancer (HCC) 2017   right breast   Breast cancer of lower-inner quadrant of right female breast (HCC) 08/08/2015   Colon polyps    DDD (degenerative disc disease), cervical    Degenerative joint disease    Diabetes mellitus without complication (HCC)    Diverticulosis    Gallstones    GERD (gastroesophageal reflux disease)    Headache(784.0)    migraine like per patient   Hypertension    Low back pain    dr Vear Clock, pain management   Osteoarthritis    Personal history of chemotherapy 2017   Personal history of radiation therapy 2017   Allergies  Allergen Reactions   Lisinopril Shortness Of Breath and Swelling    Angioedema   Codeine Itching, Nausea And Vomiting and Other (See Comments)    Itching all over the body   Latex Hives   Sulfonamide Derivatives Hives and Other (See Comments)    All over the body   Gabapentin Other (See Comments)    headache   Lyrica [Pregabalin] Other (See Comments)    headache    Social History   Socioeconomic History   Marital status:  Married    Spouse name: Not on file   Number of children: 3   Years of education: Not on file   Highest education level: Not on file  Occupational History   Occupation: retired/disabled  Tobacco Use   Smoking status: Some Days    Current packs/day: 0.00    Average packs/day: 0.3 packs/day for 20.0 years (5.0 ttl pk-yrs)    Types: Cigarettes    Start date: 05/08/2000    Last attempt to quit: 05/08/2020    Years since quitting: 3.6   Smokeless tobacco: Former    Quit date: 09/30/2015   Tobacco comments:    smokes about  4 cigs/day; working on quitting - 08/22/16 gwd  Vaping Use   Vaping status: Never Used  Substance and Sexual Activity   Alcohol use: No    Alcohol/week: 0.0 standard drinks of alcohol   Drug use: No   Sexual activity: Yes    Birth control/protection: Post-menopausal  Other Topics Concern   Not on file  Social History Narrative   Not on file   Social Drivers of Health   Financial Resource Strain: Not on file  Food Insecurity: Not on file  Transportation Needs: Not on file  Physical Activity: Not on file  Stress: Not on file  Social Connections: Not on file   Vitals:   12/21/23 0954  BP: 130/80  Pulse: 76  Resp: 16  Temp: 98.3 F (36.8 C)  SpO2: 99%   Body mass index is 40.57 kg/m.  Physical Exam Vitals and nursing note reviewed.  Constitutional:      General: She is not in acute distress.    Appearance: She is well-developed.  HENT:     Head: Normocephalic and atraumatic.     Mouth/Throat:     Mouth: Mucous membranes are dry.     Pharynx: Oropharynx is clear. Uvula midline.  Eyes:     General:        Right eye: No foreign body.        Left eye: No foreign body.     Extraocular Movements: Extraocular movements intact.     Conjunctiva/sclera: Conjunctivae normal.     Right eye: Right conjunctiva is not injected. No exudate or hemorrhage.    Left eye: Left conjunctiva is not injected. No exudate or hemorrhage.    Pupils: Pupils are equal,  round, and reactive to light.     Funduscopic exam:    Right eye: No hemorrhage or papilledema.        Left eye: No hemorrhage or papilledema.     Comments: Fundal examination was difficult because blinking and moving eye but I did not appreciate hemorrhages or papilledema. No photophobia.  Cardiovascular:     Rate and Rhythm: Normal rate and regular rhythm.     Heart sounds: No murmur heard. Pulmonary:     Effort: Pulmonary effort is normal. No respiratory distress.     Breath sounds: Normal breath sounds.  Lymphadenopathy:     Cervical: No cervical adenopathy.  Skin:    General: Skin is warm.     Findings: No erythema or rash.  Neurological:     General: No focal deficit present.     Mental Status: She is alert and oriented to person, place, and time.     Cranial Nerves: No cranial nerve deficit.     Gait: Gait normal.  Psychiatric:        Mood and Affect: Mood and affect normal.   ASSESSMENT AND PLAN:  Ms. Roppolo was seen today for right eye dryness and pain.   Pain of right eye We discussed possible etiologies. History and examination do not suggest a serious process. Reporting negative eye exam late last year, eye was dilated. She has risk factors for ophthalmic complications, so recommend arranging an appt with her eye care provider, she would like a referral to ophthalmologist. She has not followed with PCP for DM II, last HgA1C was not at goal, 7.7 in 07/2022. Stressed the importance of following a recommended. Clearly instructed about warning signs.  -     Ambulatory referral to Ophthalmology  Dry eye syndrome of right eye  Some of her medications could be contributing factors. She has not been natural tears in a month or so. Recommend resuming Liquifilm Tears.  Vision Screening   Right eye Left eye Both eyes  Without correction     With correction 20/25 20/20 20/20    I spent a total of 31 minutes in both face to face and non face to face activities for this  visit on the date of this encounter. During this time history was obtained and documented, examination was performed, prior labs reviewed, and assessment/plan discussed.  Return in about 4 weeks (around 01/18/2024) for with PCP for f/u on eye pain and follow up on chronic problems.  I, Rolla Etienne Wierda, acting as a scribe for Kathelyn Gombos [place], MD., have documented all relevant documentation on the behalf of Ashliegh Parekh [place], MD, as directed by  Keyah Blizard [place], MD while in the presence of Aarik Blank [place], MD.   I, Darnette Lampron [place], MD, have reviewed all documentation for this visit. The documentation on 12/21/23 for the exam, diagnosis, procedures, and orders are all accurate and complete.  Mearl Olver G. [place], MD  [facility]. [place] office.

## 2023-12-21 NOTE — Patient Instructions (Addendum)
 A few things to remember from today's visit:  Pain of right eye - Plan: Ambulatory referral to Ophthalmology  Dry eye syndrome of right eye  Examination today was normal. Resume tears and apply several times during the day. You need to arrange an appt for diabetes follow up.  Do not use My Chart to request refills or for acute issues that need immediate attention. If you send a my chart message, it may take a few days to be addressed, specially if I am not in the office.  Please be sure medication list is accurate. If a new problem present, please set up appointment sooner than planned today.

## 2023-12-28 DIAGNOSIS — Z79891 Long term (current) use of opiate analgesic: Secondary | ICD-10-CM | POA: Diagnosis not present

## 2023-12-28 DIAGNOSIS — G894 Chronic pain syndrome: Secondary | ICD-10-CM | POA: Diagnosis not present

## 2023-12-30 ENCOUNTER — Ambulatory Visit: Payer: Self-pay

## 2023-12-30 ENCOUNTER — Encounter: Payer: Self-pay | Admitting: Family Medicine

## 2023-12-30 ENCOUNTER — Telehealth: Payer: Self-pay | Admitting: Internal Medicine

## 2023-12-30 ENCOUNTER — Ambulatory Visit (INDEPENDENT_AMBULATORY_CARE_PROVIDER_SITE_OTHER): Admitting: Family Medicine

## 2023-12-30 VITALS — BP 110/68 | HR 74 | Temp 98.3°F | Ht 67.0 in | Wt 264.0 lb

## 2023-12-30 DIAGNOSIS — Z23 Encounter for immunization: Secondary | ICD-10-CM | POA: Diagnosis not present

## 2023-12-30 DIAGNOSIS — E538 Deficiency of other specified B group vitamins: Secondary | ICD-10-CM

## 2023-12-30 DIAGNOSIS — E876 Hypokalemia: Secondary | ICD-10-CM

## 2023-12-30 DIAGNOSIS — M353 Polymyalgia rheumatica: Secondary | ICD-10-CM | POA: Diagnosis not present

## 2023-12-30 DIAGNOSIS — Z0000 Encounter for general adult medical examination without abnormal findings: Secondary | ICD-10-CM | POA: Diagnosis not present

## 2023-12-30 LAB — HEMOGLOBIN A1C: Hgb A1c MFr Bld: 7.2 % — ABNORMAL HIGH (ref 4.6–6.5)

## 2023-12-30 LAB — CBC WITH DIFFERENTIAL/PLATELET
Basophils Absolute: 0 10*3/uL (ref 0.0–0.1)
Basophils Relative: 0.5 % (ref 0.0–3.0)
Eosinophils Absolute: 0.1 10*3/uL (ref 0.0–0.7)
Eosinophils Relative: 1.4 % (ref 0.0–5.0)
HCT: 38.2 % (ref 36.0–46.0)
Hemoglobin: 12.9 g/dL (ref 12.0–15.0)
Lymphocytes Relative: 39.8 % (ref 12.0–46.0)
Lymphs Abs: 3.4 10*3/uL (ref 0.7–4.0)
MCHC: 33.7 g/dL (ref 30.0–36.0)
MCV: 88 fl (ref 78.0–100.0)
Monocytes Absolute: 0.5 10*3/uL (ref 0.1–1.0)
Monocytes Relative: 5.3 % (ref 3.0–12.0)
Neutro Abs: 4.5 10*3/uL (ref 1.4–7.7)
Neutrophils Relative %: 53 % (ref 43.0–77.0)
Platelets: 323 10*3/uL (ref 150.0–400.0)
RBC: 4.34 Mil/uL (ref 3.87–5.11)
RDW: 14.4 % (ref 11.5–15.5)
WBC: 8.5 10*3/uL (ref 4.0–10.5)

## 2023-12-30 LAB — LIPID PANEL
Cholesterol: 221 mg/dL — ABNORMAL HIGH (ref 0–200)
HDL: 46.2 mg/dL (ref >39.00)
LDL Cholesterol: 115 mg/dL — ABNORMAL HIGH (ref 0–99)
NonHDL: 174.93
Total CHOL/HDL Ratio: 5
Triglycerides: 300 mg/dL — ABNORMAL HIGH (ref 0.0–149.0)
VLDL: 60 mg/dL — ABNORMAL HIGH (ref 0.0–40.0)

## 2023-12-30 LAB — HEPATIC FUNCTION PANEL
ALT: 15 U/L (ref 0–35)
AST: 23 U/L (ref 0–37)
Albumin: 4.6 g/dL (ref 3.5–5.2)
Alkaline Phosphatase: 82 U/L (ref 39–117)
Bilirubin, Direct: 0.1 mg/dL (ref 0.0–0.3)
Total Bilirubin: 0.3 mg/dL (ref 0.2–1.2)
Total Protein: 7.3 g/dL (ref 6.0–8.3)

## 2023-12-30 LAB — VITAMIN B12: Vitamin B-12: 324 pg/mL (ref 211–911)

## 2023-12-30 LAB — BASIC METABOLIC PANEL WITH GFR
BUN: 19 mg/dL (ref 6–23)
CO2: 34 meq/L — ABNORMAL HIGH (ref 19–32)
Calcium: 9.8 mg/dL (ref 8.4–10.5)
Chloride: 94 meq/L — ABNORMAL LOW (ref 96–112)
Creatinine, Ser: 1.1 mg/dL (ref 0.40–1.20)
GFR: 53.25 mL/min — ABNORMAL LOW (ref >60.00)
Glucose, Bld: 75 mg/dL (ref 70–99)
Potassium: 2.7 meq/L — CL (ref 3.5–5.1)
Sodium: 139 meq/L (ref 135–145)

## 2023-12-30 LAB — C-REACTIVE PROTEIN: CRP: <1 mg/dL (ref 0.5–20.0)

## 2023-12-30 LAB — SEDIMENTATION RATE: Sed Rate: 23 mm/h (ref 0–30)

## 2023-12-30 LAB — TSH: TSH: 4.65 u[IU]/mL (ref 0.35–5.50)

## 2023-12-30 MED ORDER — METFORMIN HCL 500 MG PO TABS: 500.0000 mg | ORAL_TABLET | Freq: Two times a day (BID) | ORAL | 3 refills | Status: AC

## 2023-12-30 MED ORDER — POTASSIUM CHLORIDE CRYS ER 20 MEQ PO TBCR: 20.0000 meq | EXTENDED_RELEASE_TABLET | Freq: Every day | ORAL | 3 refills | Status: DC

## 2023-12-30 NOTE — Telephone Encounter (Signed)
 Potassium 2.7.  Called her and asked her to take 3 pills of potassium chloride (30 meq) tonight.

## 2023-12-30 NOTE — Addendum Note (Signed)
 Addended by: Carola Rhine on: 12/30/2023 02:27 PM   Modules accepted: Orders

## 2023-12-30 NOTE — Telephone Encounter (Signed)
 Shaa with [facility] reporting a critical lab of Potassium 2.7. This RN attempted to contact on call provider, Dr. Posey Rea, and no answer. LVM. Will call back for second attempt.   Copied from CRM [id number]. Topic: General - Other >> Dec 30, 2023  5:11 PM [name] wrote: Reason for CRM: [name] w/[facility] w/[facility] Reporting Critical on the patient Reason for Disposition  Lab or radiology calling with CRITICAL test results  Answer Assessment - Initial Assessment Questions 1. REASON FOR CALL or QUESTION: "What is your reason for calling today?" or "How can I best help you?" or "What question do you have that I can help answer?"     Critical Lab: Potassium 2.7 2. CALLER: Document the source of call. (e.[name]., laboratory, patient).     [name] with [facility]  Protocols used: PCP Call - No Triage-A-AH

## 2023-12-30 NOTE — Progress Notes (Signed)
   Subjective:    Patient ID: Joie Bimler, female    DOB: 08-29-1961, 63 y.o.   MRN: [id number]  HPI Here for a well exam. She is doing well in general. She still sees her pain management provider regularly.    Review of Systems  Constitutional: Negative.   HENT: Negative.    Eyes: Negative.   Respiratory: Negative.    Cardiovascular: Negative.   Gastrointestinal: Negative.   Genitourinary:  Negative for decreased urine volume, difficulty urinating, dyspareunia, dysuria, enuresis, flank pain, frequency, hematuria, pelvic pain and urgency.  Musculoskeletal:  Positive for arthralgias.  Skin: Negative.   Neurological: Negative.  Negative for headaches.  Psychiatric/Behavioral: Negative.         Objective:   Physical Exam Constitutional:      General: She is not in acute distress.    Appearance: She is well-developed. She is obese.  HENT:     Head: Normocephalic and atraumatic.     Right Ear: External ear normal.     Left Ear: External ear normal.     Nose: Nose normal.     Mouth/Throat:     Pharynx: No oropharyngeal exudate.  Eyes:     General: No scleral icterus.    Conjunctiva/sclera: Conjunctivae normal.     Pupils: Pupils are equal, round, and reactive to light.  Neck:     Thyroid: No thyromegaly.     Vascular: No JVD.  Cardiovascular:     Rate and Rhythm: Normal rate and regular rhythm.     Pulses: Normal pulses.     Heart sounds: Normal heart sounds. No murmur heard.    No friction rub. No gallop.  Pulmonary:     Effort: Pulmonary effort is normal. No respiratory distress.     Breath sounds: Normal breath sounds. No wheezing or rales.  Chest:     Chest wall: No tenderness.  Abdominal:     General: Bowel sounds are normal. There is no distension.     Palpations: Abdomen is soft. There is no mass.     Tenderness: There is no abdominal tenderness. There is no guarding or rebound.  Musculoskeletal:        General: No tenderness. Normal range of motion.      Cervical back: Normal range of motion and neck supple.  Lymphadenopathy:     Cervical: No cervical adenopathy.  Skin:    General: Skin is warm and dry.     Findings: No erythema or rash.  Neurological:     General: No focal deficit present.     Mental Status: She is alert and oriented to person, place, and time.     Cranial Nerves: No cranial nerve deficit.     Motor: No abnormal muscle tone.     Coordination: Coordination normal.     Deep Tendon Reflexes: Reflexes are normal and symmetric. Reflexes normal.  Psychiatric:        Mood and Affect: Mood normal.        Behavior: Behavior normal.        Thought Content: Thought content normal.        Judgment: Judgment normal.           Assessment & Plan:  Well exam. We discussed diet and exercise. Get fasting labs. Gershon Crane, MD

## 2023-12-30 NOTE — Telephone Encounter (Signed)
 This RN attempted second attempt at On Call Provider. No answer. Will route to call back.

## 2023-12-31 ENCOUNTER — Other Ambulatory Visit: Payer: Self-pay

## 2023-12-31 DIAGNOSIS — E876 Hypokalemia: Secondary | ICD-10-CM

## 2023-12-31 MED ORDER — POTASSIUM CHLORIDE CRYS ER 20 MEQ PO TBCR: 20.0000 meq | EXTENDED_RELEASE_TABLET | Freq: Two times a day (BID) | ORAL | 5 refills | Status: DC

## 2023-12-31 NOTE — Telephone Encounter (Signed)
We took care of this today

## 2024-01-18 ENCOUNTER — Other Ambulatory Visit (INDEPENDENT_AMBULATORY_CARE_PROVIDER_SITE_OTHER)

## 2024-01-18 DIAGNOSIS — E876 Hypokalemia: Secondary | ICD-10-CM | POA: Diagnosis not present

## 2024-01-18 LAB — BASIC METABOLIC PANEL WITH GFR
BUN: 11 mg/dL (ref 6–23)
CO2: 34 meq/L — ABNORMAL HIGH (ref 19–32)
Calcium: 9 mg/dL (ref 8.4–10.5)
Chloride: 96 meq/L (ref 96–112)
Creatinine, Ser: 0.88 mg/dL (ref 0.40–1.20)
GFR: 69.57 mL/min (ref >60.00)
Glucose, Bld: 144 mg/dL — ABNORMAL HIGH (ref 70–99)
Potassium: 3.1 meq/L — ABNORMAL LOW (ref 3.5–5.1)
Sodium: 141 meq/L (ref 135–145)

## 2024-01-18 NOTE — Telephone Encounter (Signed)
 Done

## 2024-01-20 ENCOUNTER — Other Ambulatory Visit: Payer: Self-pay

## 2024-01-20 MED ORDER — POTASSIUM CHLORIDE CRYS ER 20 MEQ PO TBCR: EXTENDED_RELEASE_TABLET | ORAL | 0 refills | Status: DC

## 2024-02-16 DIAGNOSIS — D1801 Hemangioma of skin and subcutaneous tissue: Secondary | ICD-10-CM | POA: Diagnosis not present

## 2024-02-16 DIAGNOSIS — L811 Chloasma: Secondary | ICD-10-CM | POA: Diagnosis not present

## 2024-02-16 DIAGNOSIS — D485 Neoplasm of uncertain behavior of skin: Secondary | ICD-10-CM | POA: Diagnosis not present

## 2024-02-23 ENCOUNTER — Telehealth: Payer: Self-pay

## 2024-02-23 NOTE — Telephone Encounter (Signed)
 Copied from CRM [id number]. Topic: General - Other >> Feb 23, 2024  1:44 PM Aisha D wrote: Reason for CRM: Pt stated that she has surgery on both of her knees and is now experiencing pain. Pt stated that she is having pain in her left knee and contacted [facility] but they don't have anything available until next year. Pt would like to know if Dr.Fry has any other recommendations on where she could go and would like to receive a call back regarding this concern.

## 2024-02-24 NOTE — Telephone Encounter (Signed)
 I am not sure what to suggest. She sees both Orthopedics and Sports Medicine at [facility]. I cannot believe no one will see her until next year

## 2024-02-25 ENCOUNTER — Other Ambulatory Visit: Payer: Self-pay | Admitting: Family Medicine

## 2024-02-25 DIAGNOSIS — I10 Essential (primary) hypertension: Secondary | ICD-10-CM

## 2024-02-25 NOTE — Telephone Encounter (Signed)
Pt notified of Dr Clent Ridges advise, verbalized understanding

## 2024-02-26 ENCOUNTER — Emergency Department (HOSPITAL_COMMUNITY)
Admission: EM | Admit: 2024-02-26 | Discharge: 2024-02-26 | Disposition: A | Discharge status: Home / Self Care | Attending: Emergency Medicine | Admitting: Emergency Medicine

## 2024-02-26 ENCOUNTER — Other Ambulatory Visit: Payer: Self-pay

## 2024-02-26 ENCOUNTER — Emergency Department (HOSPITAL_COMMUNITY)

## 2024-02-26 ENCOUNTER — Encounter (HOSPITAL_COMMUNITY): Payer: Self-pay

## 2024-02-26 DIAGNOSIS — M7989 Other specified soft tissue disorders: Secondary | ICD-10-CM | POA: Diagnosis not present

## 2024-02-26 DIAGNOSIS — Z91040 Latex allergy status: Secondary | ICD-10-CM | POA: Insufficient documentation

## 2024-02-26 DIAGNOSIS — Z96652 Presence of left artificial knee joint: Secondary | ICD-10-CM | POA: Diagnosis not present

## 2024-02-26 DIAGNOSIS — Z7984 Long term (current) use of oral hypoglycemic drugs: Secondary | ICD-10-CM | POA: Diagnosis not present

## 2024-02-26 DIAGNOSIS — Z471 Aftercare following joint replacement surgery: Secondary | ICD-10-CM | POA: Diagnosis not present

## 2024-02-26 DIAGNOSIS — M25562 Pain in left knee: Secondary | ICD-10-CM | POA: Diagnosis not present

## 2024-02-26 MED ORDER — LIDOCAINE 5 % EX PTCH
1.0000 | MEDICATED_PATCH | CUTANEOUS | Status: DC
  Administered 2024-02-26: 1 via TRANSDERMAL
  Filled 2024-02-26: qty 1

## 2024-02-26 MED ORDER — LIDOCAINE 5 % EX PTCH: 1.0000 | MEDICATED_PATCH | CUTANEOUS | 0 refills | Status: AC

## 2024-02-26 NOTE — Discharge Instructions (Signed)
 You have been evaluated for your knee pain.  Fortunately x-ray today did not show any concerning finding.  No loose part based on the x-ray.  Please wear knee sleeves for support.  You may apply Lidoderm  patch to the affected area daily as needed for pain control but follow-up closely with your orthopedic specialist for outpatient care.

## 2024-02-26 NOTE — ED Provider Notes (Signed)
 [place] [facility] Provider Note   CSN: [id number] Arrival date & time: 02/26/24  4098     History  Chief Complaint  Patient presents with   Leg Pain    [name] is a 63 y.o. female.  The history is provided by the patient and medical records. No language interpreter was used.  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  For the past 3-week she has had ongoing pain about her left knee.  Pain is constant and sharp and occasionally with jolts of pain that happen sporadically.  She feels like her knee is loose compared to prior.  She has had multiple knee replacement in the same knee in the past and received care through [facility] orthopedic.  She did try to reach out to her orthopedist specialist for evaluation but the next available appointment is not until August of this year.  She states she has been taking her pain medication which includes Percocet as well as methadone  but pain is not adequately relieved.  She does not endorse any fever or chills she has not noticed any redness no recent injury denies any hip pain or ankle pain.  She had a knee sleeve in the past but states that it is loose and she would likely need another knee sleeve.    Home Medications Prior to Admission medications   Medication Sig Start Date End Date Taking? Authorizing Provider  amitriptyline  (ELAVIL ) 50 MG tablet Take 2 tablets (100 mg total) by mouth at bedtime. 06/29/23   Donley Furth, MD  amLODipine  (NORVASC ) 10 MG tablet TAKE 1 TABLET BY MOUTH EVERY DAY 02/25/24   Donley Furth, MD  Blood Glucose Monitoring Suppl (ONE TOUCH ULTRA 2) w/Device KIT Use to check blood glucose daily Dx Code: E11.9 09/25/23   Donley Furth, MD  glucose blood (ONETOUCH ULTRA) test strip Use as instructed 09/28/23   Donley Furth, MD   magnesium  30 MG tablet Take 1 tablet (30 mg total) by mouth daily. 06/30/23   Mozell Arias, MD  metFORMIN  (GLUCOPHAGE ) 500 MG tablet Take 1 tablet (500 mg total) by mouth 2 (two) times daily with a meal. 12/30/23   Donley Furth, MD  methadone  (DOLOPHINE ) 10 MG tablet Take 10 mg by mouth every 8 (eight) hours.  05/24/19   [provider]  methocarbamol  (ROBAXIN ) 750 MG tablet Take 750 mg by mouth. 07/20/20   [provider]  metoprolol  tartrate (LOPRESSOR ) 50 MG tablet TAKE 1 TABLET BY MOUTH TWICE A DAY 11/25/23   Donley Furth, MD  omeprazole  (PRILOSEC) 40 MG capsule TAKE 1 CAPSULE BY MOUTH EVERY DAY 11/25/23   Donley Furth, MD  OneTouch Delica Lancets 33G MISC 1 each by Does not apply route daily at 12 noon. 09/28/23   Donley Furth, MD  Oxycodone  HCl 10 MG TABS Take 1 tablet by mouth every six hours as needed for pain 01/24/22     polyvinyl alcohol (LIQUIFILM TEARS) 1.4 % ophthalmic solution Place 1 drop into both eyes as needed.    [provider]  potassium chloride  SA (KLOR-CON  M) 20 MEQ tablet Take 2 tablets (total of 40 mEq) Twice daily 01/20/24   Donley Furth, MD  SODIUM FLUORIDE 5000 SENSITIVE 1.1-5 % GEL Take 1 Application by mouth in the  morning and at bedtime. 02/24/23   [provider]  Vitamin D , Ergocalciferol , (DRISDOL ) 1.25 MG (50000 UNIT) CAPS capsule TAKE 1 CAPSULE (50,000 UNITS TOTAL) BY MOUTH EVERY 7 (SEVEN) DAYS 08/07/23   Donley Furth, MD  zolpidem  (AMBIEN ) 10 MG tablet Take 2 tablets (20 mg total) by mouth at bedtime as needed for sleep. 11/26/23   Donley Furth, MD  lisinopril  (ZESTRIL ) 10 MG tablet TAKE 1 TABLET BY MOUTH EVERY DAY Patient taking differently: Take 10 mg by mouth daily.  02/02/20 04/12/20  Donley Furth, MD      Allergies    Lisinopril , Codeine, Latex, Sulfonamide derivatives, Gabapentin , and Lyrica  [pregabalin ]    Review of Systems   Review of Systems  Constitutional:  Negative for fever.  Musculoskeletal:  Positive  for arthralgias and joint swelling.  Skin:  Negative for wound.    Physical Exam Updated Vital Signs BP (!) 155/88   Pulse 74   Temp 98.6 F (37 C) (Oral)   Resp 20   Ht 5\' 7"  (1.702 m)   Wt 119.3 kg   SpO2 99%   BMI 41.19 kg/m  Physical Exam Vitals and nursing note reviewed.  Constitutional:      General: She is not in acute distress.    Appearance: She is well-developed.  HENT:     Head: Atraumatic.  Eyes:     Conjunctiva/sclera: Conjunctivae normal.  Pulmonary:     Effort: Pulmonary effort is normal.  Musculoskeletal:        General: Tenderness (Left knee: Tenderness to the anterior knee on palpation without any obvious joint laxity.  No surrounding erythema edema or warmth appreciated.  Well-appearing surgical scar anteriorly.  Able to flex and extend knee.  Patella is located) present.     Cervical back: Neck supple.     Comments: No tenderness to left hip or left ankle, intact DP pulse.  Skin:    Findings: No rash.  Neurological:     Mental Status: She is alert.  Psychiatric:        Mood and Affect: Mood normal.     ED Results / Procedures / Treatments   Labs (all labs ordered are listed, but only abnormal results are displayed) Labs Reviewed - No data to display  EKG None  Radiology DG Knee Complete 4 Views Left Result Date: 02/26/2024 CLINICAL DATA:  Left knee pain and swelling. EXAM: LEFT KNEE - COMPLETE 4+ VIEW COMPARISON:  July 13, 2020. FINDINGS: Status post left total knee arthroplasty. The femoral tibial components are well situated. No acute fracture or dislocation is noted. IMPRESSION: No acute abnormality seen. Electronically Signed   By: Rosalene Colon M.D.   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  For the past 3-week she has had ongoing pain about her left knee.  Pain is constant and sharp and occasionally with jolts of pain that happen sporadically.  She feels like her knee is loose compared to prior.  She has had multiple knee replacement in the same knee in the past and received care through [facility] orthopedic.  She did try to reach out to her orthopedist specialist for evaluation but the next available appointment is not until August of this year.  She states she has been taking her pain medication which includes Percocet as well as methadone  but pain is not adequately relieved.  She does not endorse any fever or chills she has not noticed any redness no recent injury denies any hip pain or ankle pain.  She had a knee sleeve in the past but states that it is loose and she would likely need another knee sleeve.    On exam patient has tenderness about the left knee anteriorly with normal flexion and extension there is no joint laxity there is no obvious signs concerning for septic joint or cellulitis.  X-ray obtained independent viewed inter by me showing no acute abnormalities.  I suspect this is likely acute on chronic pain.  I will provide patient with Lidoderm  patch for comfort.  Will provide patient with a knee sleeves and encouraged patient to follow-up closely with orthopedic specialist for further care.  Patient voiced understanding and agrees with plan.  Patient able to ambulate.          Final Clinical Impression(s) / ED Diagnoses Final diagnoses:  Acute pain of left knee    Rx / DC Orders ED Discharge Orders          Ordered    lidocaine  (LIDODERM ) 5 %  Every 24 hours        02/26/24 1207               Debbra Fairy, PA-C 02/26/24 1209    Lind Repine, MD 02/27/24 1459

## 2024-02-26 NOTE — ED Provider Triage Note (Signed)
 Emergency Medicine Provider Triage Evaluation Note  [name] , a 63 y.o. female  was evaluated in triage.  Pt complains of left knee pain. Hx of knee replacements x 4 on left side due to parts misalignment. Has appointment with [facility] but not till August. Is concerned it is misaligned again. No redness or pain.  Review of Systems  Positive: See hpi above Negative: See hpi above  Physical Exam  BP (!) 155/88   Pulse 74   Temp 98.6 F (37 C) (Oral)   Resp 20   Ht 5\' 7"  (1.702 m)   Wt 119.3 kg   SpO2 99%   BMI 41.19 kg/m  Gen:   Awake, no distress   Resp:  Normal effort  MSK:   Moves extremities without difficulty. Localized swelling left knee. No erythema.     Medical Decision Making  Medically screening exam initiated at 10:38 AM.  Appropriate orders placed.  Bolivar Bushman was informed that the remainder of the evaluation will be completed by another provider, this initial triage assessment does not replace that evaluation, and the importance of remaining in the ED until their evaluation is complete.  Xray, pain meds, dc with ortho f/u.  Has pain contract so no prescription meds.    Owen Blowers P, DO 02/26/24 1040

## 2024-02-26 NOTE — ED Triage Notes (Signed)
 Pt here for left leg pain that started 3 weeks ago, pt had knee placement 2017. C/O swollen, denies redness and fevers.

## 2024-02-26 NOTE — Progress Notes (Signed)
 Orthopedic Tech Progress Note Patient Details:  [name] 10-Jun-1961 [id number]  Ortho Devices Type of Ortho Device: Knee Sleeve Ortho Device/Splint Location: LLE Ortho Device/Splint Interventions: Ordered, Application   Post Interventions Patient Tolerated: Well  [name] 02/26/2024, 12:50 PM

## 2024-03-08 DIAGNOSIS — M545 Low back pain, unspecified: Secondary | ICD-10-CM | POA: Diagnosis not present

## 2024-03-08 DIAGNOSIS — M25562 Pain in left knee: Secondary | ICD-10-CM | POA: Diagnosis not present

## 2024-03-08 DIAGNOSIS — M47812 Spondylosis without myelopathy or radiculopathy, cervical region: Secondary | ICD-10-CM | POA: Diagnosis not present

## 2024-03-08 DIAGNOSIS — M150 Primary generalized (osteo)arthritis: Secondary | ICD-10-CM | POA: Diagnosis not present

## 2024-03-16 ENCOUNTER — Other Ambulatory Visit: Payer: Self-pay | Admitting: Family Medicine

## 2024-03-16 DIAGNOSIS — I10 Essential (primary) hypertension: Secondary | ICD-10-CM

## 2024-03-21 ENCOUNTER — Other Ambulatory Visit: Payer: Self-pay | Admitting: Family Medicine

## 2024-03-21 ENCOUNTER — Ambulatory Visit: Payer: Self-pay

## 2024-03-21 NOTE — Telephone Encounter (Signed)
 FYI Only or Action Required?: FYI only for provider.  Patient was last seen in primary care on 12/30/2023 by [name] LABOR, MD. Called Nurse Triage reporting Dizziness. Symptoms began 5 days ago. Interventions attempted: Rest, hydration, or home remedies. Symptoms are: unchanged.  Triage Disposition: See Physician Within 24 Hours  Patient/caregiver understands and will follow disposition?: Yes  Copied from CRM #[id number]. Topic: Clinical - Red Word Triage >> Mar 21, 2024  4:22 PM [name] wrote: [facility] that prompted transfer to Nurse Triage: random dizzy spells, hands numb, almost falling out and has to stand still to avoid falling Reason for Disposition  [1] MODERATE dizziness (e.g., interferes with normal activities) AND [2] has NOT been evaluated by doctor (or NP/PA) for this  (Exception: Dizziness caused by heat exposure, sudden standing, or poor fluid intake.)  Answer Assessment - Initial Assessment Questions 1. DESCRIPTION: Describe your dizziness.     Patient reports feeling lightheaded and that she might faint. Patient endorses that she has to stand still or she will fall over 2. LIGHTHEADED: Do you feel lightheaded? (e.g., somewhat faint, woozy, weak upon standing)     yes 3. VERTIGO: Do you feel like either you or the room is spinning or tilting? (i.e. vertigo)     no 4. SEVERITY: How bad is it?  Do you feel like you are going to faint? Can you stand and walk?   - MILD: Feels slightly dizzy, but walking normally.   - MODERATE: Feels unsteady when walking, but not falling; interferes with normal activities (e.g., school, work).   - SEVERE: Unable to walk without falling, or requires assistance to walk without falling; feels like passing out now.      Moderate 5. ONSET:  When did the dizziness begin?     Five days ago 6. AGGRAVATING FACTORS: Does anything make it worse? (e.g., standing, change in head position)     Changing positions 7. HEART RATE: Can you tell  me your heart rate? How many beats in 15 seconds?  (Note: not all patients can do this)       N/a-patient doesn't have the ability to check HR 8. CAUSE: What do you think is causing the dizziness?     unsure 9. RECURRENT SYMPTOM: Have you had dizziness before? If Yes, ask: When was the last time? What happened that time?     Yes but several years 10. OTHER SYMPTOMS: Do you have any other symptoms? (e.g., fever, chest pain, vomiting, diarrhea, bleeding)       Hand numbness  Protocols used: Dizziness - Lightheadedness-A-AH

## 2024-03-22 ENCOUNTER — Encounter: Payer: Self-pay | Admitting: Family Medicine

## 2024-03-22 ENCOUNTER — Ambulatory Visit: Admitting: Family Medicine

## 2024-03-22 ENCOUNTER — Other Ambulatory Visit: Payer: Self-pay | Admitting: Family Medicine

## 2024-03-22 ENCOUNTER — Ambulatory Visit: Payer: Self-pay | Admitting: Family Medicine

## 2024-03-22 VITALS — BP 140/70 | Temp 98.3°F | Wt 259.7 lb

## 2024-03-22 DIAGNOSIS — R42 Dizziness and giddiness: Secondary | ICD-10-CM

## 2024-03-22 DIAGNOSIS — I10 Essential (primary) hypertension: Secondary | ICD-10-CM

## 2024-03-22 DIAGNOSIS — R079 Chest pain, unspecified: Secondary | ICD-10-CM | POA: Diagnosis not present

## 2024-03-22 DIAGNOSIS — Z1231 Encounter for screening mammogram for malignant neoplasm of breast: Secondary | ICD-10-CM

## 2024-03-22 DIAGNOSIS — E876 Hypokalemia: Secondary | ICD-10-CM | POA: Diagnosis not present

## 2024-03-22 NOTE — Progress Notes (Signed)
 Established Patient Office Visit  Subjective   Patient ID: [name], female    DOB: 1961-02-11  Age: 63 y.o. MRN: [id number]  Chief Complaint  Patient presents with   Dizziness    HPI   [name] has history of hypertension, GERD, type 2 diabetes, osteoarthritis involving multiple joints, history of gout, past history of breast cancer.  She just recently got back from a trip to [place].  She states about 6 days ago she developed some dizziness which she described as lightheadedness.  No syncope.  Dizziness somewhat triggered by head movement.  Not consistently with standing.  She does take blood pressure medications with amlodipine  and metoprolol .  Not aware of any documented orthostatic symptoms.  Denies vertigo symptoms.  No nausea or vomiting.  No headache.  She states she stays very well-hydrated.  No recent urinary symptoms.  Did have recent very low potassium of 2.7 and follow-up late April 3.1.  Still takes potassium supplement 1 daily.  She had CBC in April with normal hemoglobin and also normal TSH at that time  She does have chronic pain from her arthritis and is on methadone  and apparently takes oxycodone  as well.  No recent EKG.  She relates yesterday having sensation of heaviness in her chest which lasted about 4 or 5 minutes.  No dyspnea.  No diaphoresis.  Multiple risk factors for CAD.  She currently smokes about 5 cigarettes/day and has hyperlipidemia which is currently untreated and diabetes.  10-year calculated risk for CAD 46.7%  Strong family history of heart disease.  She states she has had a couple of relatives with amyloidosis involving the heart.  Patient had echocardiogram and nuclear stress test back in 2020.  No ischemia noted at that time.  Past Medical History:  Diagnosis Date   Breast cancer (HCC) 2017   right breast   Breast cancer of lower-inner quadrant of right female breast (HCC) 08/08/2015   Colon polyps    DDD (degenerative disc disease),  cervical    Degenerative joint disease    Diabetes mellitus without complication (HCC)    Diverticulosis    Gallstones    GERD (gastroesophageal reflux disease)    Headache(784.0)    migraine like per patient   Hypertension    Low back pain    dr orlando, pain management   Osteoarthritis    Personal history of chemotherapy 2017   Personal history of radiation therapy 2017   Past Surgical History:  Procedure Laterality Date   CARPAL TUNNEL RELEASE Right 09/30/2021   Procedure: Right CARPAL TUNNEL RELEASE;  Surgeon: Romona Harari, MD;  Location: [facility];  Service: Orthopedics;  Laterality: Right;   CHOLECYSTECTOMY  10/28/2011   Procedure: LAPAROSCOPIC CHOLECYSTECTOMY WITH INTRAOPERATIVE CHOLANGIOGRAM;  Surgeon: Krystal CHRISTELLA Spinner, MD;  Location: [facility];  Service: General;  Laterality: N/A;   COLONOSCOPY  04/21/2023   per Dr. Abran, adenmatous polyps, repeat in 5 yrs   COLONOSCOPY WITH ESOPHAGOGASTRODUODENOSCOPY (EGD)  04/13/2018   per Dr. Abran, adenomatous polyp, repeat in 5 yrs    EXAM UNDER ANESTHESIA WITH MANIPULATION OF KNEE Right 05/30/2003   EXAM UNDER ANESTHESIA WITH MANIPULATION OF KNEE Left 12/27/2002   GANGLION CYST EXCISION Right    right wrist   KNEE ARTHROSCOPY Bilateral 01/02/2000   KNEE ARTHROSCOPY Right    LAPAROSCOPIC VAGINAL HYSTERECTOMY WITH SALPINGO OOPHORECTOMY Bilateral 07/13/2006   LIPOMA EXCISION Right 02/21/2008   hip   MASS EXCISION  08/24/2012   Procedure: EXCISION  MASS;  Surgeon: Krystal CHRISTELLA Spinner, MD;  Location: [facility];  Service: General;  Laterality: Left;  excisie soft tissue masses left shoulder(back) & left upper arm   MASS EXCISION Left 01/24/2014   Procedure: EXCISION SOFT TISSUE MASSES LOWER LEFT BACK;  Surgeon: Krystal CHRISTELLA Spinner, MD;  Location: [facility];  Service: General;  Laterality: Left;   MASS EXCISION Right 04/30/2016   Procedure: EXCISION OF SKIN RIGHT CHEST WALL;  Surgeon: Deward Null III,  MD;  Location: [facility];  Service: General;  Laterality: Right;  EXCISION OF SKIN RIGHT CHEST WALL   MASTECTOMY Right 2017   MASTECTOMY W/ SENTINEL NODE BIOPSY Right 10/01/2015   Procedure: RIGHT MASTECTOMY WITH SENTINEL LYMPH NODE BIOPSY;  Surgeon: Deward Null III, MD;  Location: [facility];  Service: General;  Laterality: Right;   PORT-A-CATH REMOVAL Left 04/30/2016   Procedure: REMOVAL PORT-A-CATH;  Surgeon: Deward Null III, MD;  Location: [facility];  Service: General;  Laterality: Left;  REMOVAL PORT-A-CATH   PORTACATH PLACEMENT Left 11/01/2015   Procedure: INSERTION PORT-A-CATH;  Surgeon: Deward Null III, MD;  Location: [facility];  Service: General;  Laterality: Left;   POSTERIOR FUSION LUMBAR SPINE  07/13/2018   per Dr. Lucilla, at L4-5 and L5-S1   REPLACEMENT UNICONDYLAR JOINT KNEE Left 04/20/2001   REVISION TOTAL KNEE ARTHROPLASTY Right 06/18/2004   REVISION TOTAL KNEE ARTHROPLASTY Left 11/15/2002   REVISION TOTAL KNEE ARTHROPLASTY Left 10/12/2001   REVISION TOTAL KNEE ARTHROPLASTY Right 07/09/2015   TONSILLECTOMY     TOTAL KNEE ARTHROPLASTY Right 04/25/2003   TOTAL KNEE ARTHROPLASTY Left 06/25/2009    reports that she has been smoking cigarettes. She started smoking about 23 years ago. She has a 5 pack-year smoking history. She quit smokeless tobacco use about 8 years ago. She reports that she does not drink alcohol and does not use drugs. family history includes Colon cancer in her maternal grandmother; Crohn's disease in an other family member; Heart disease in her father; Hypertension in her brother and sister; Kidney cancer in her sister; Prostate cancer in her father; Stomach cancer in her mother. Allergies  Allergen Reactions   Lisinopril  Shortness Of Breath and Swelling    Angioedema   Codeine Itching, Nausea And Vomiting and Other (See Comments)    Itching all over the body   Latex Hives   Sulfonamide Derivatives Hives and Other (See  Comments)    All over the body   Gabapentin  Other (See Comments)    headache   Lyrica  [Pregabalin ] Other (See Comments)    headache    Review of Systems  Constitutional:  Negative for chills, fever and malaise/fatigue.  Eyes:  Negative for blurred vision.  Respiratory:  Negative for shortness of breath.   Cardiovascular:  Positive for chest pain. Negative for orthopnea and leg swelling.  Neurological:  Positive for dizziness. Negative for weakness and headaches.      Objective:     BP (!) 140/70 (BP Location: Left Arm, Patient Position: Sitting, Cuff Size: Large)   Temp 98.3 F (36.8 C) (Oral)   Wt 259 lb 11.2 oz (117.8 kg)   BMI 40.67 kg/m  BP Readings from Last 3 Encounters:  03/22/24 (!) 140/70  02/26/24 (!) 155/88  12/30/23 110/68   Wt Readings from Last 3 Encounters:  03/22/24 259 lb 11.2 oz (117.8 kg)  02/26/24 263 lb (119.3 kg)  12/30/23 264 lb (119.7 kg)      Physical Exam  Vitals reviewed.  Constitutional:      General: She is not in acute distress.    Appearance: She is well-developed. She is not ill-appearing.   Eyes:     Pupils: Pupils are equal, round, and reactive to light.   Neck:     Thyroid : No thyromegaly.     Vascular: No JVD.   Cardiovascular:     Rate and Rhythm: Normal rate and regular rhythm.     Heart sounds:     No gallop.  Pulmonary:     Effort: Pulmonary effort is normal. No respiratory distress.     Breath sounds: Normal breath sounds. No wheezing or rales.   Musculoskeletal:     Cervical back: Neck supple.     Right lower leg: No edema.     Left lower leg: No edema.   Neurological:     General: No focal deficit present.     Mental Status: She is alert and oriented to person, place, and time.     Cranial Nerves: No cranial nerve deficit.     Motor: No weakness.     Gait: Gait normal.      No results found for any visits on 03/22/24.  Last CBC Lab Results  Component Value Date   WBC 8.5 12/30/2023   HGB 12.9  12/30/2023   HCT 38.2 12/30/2023   MCV 88.0 12/30/2023   MCH 28.6 06/30/2023   RDW 14.4 12/30/2023   PLT 323.0 12/30/2023   Last metabolic panel Lab Results  Component Value Date   GLUCOSE 144 (H) 01/18/2024   NA 141 01/18/2024   K 3.1 (L) 01/18/2024   CL 96 01/18/2024   CO2 34 (H) 01/18/2024   BUN 11 01/18/2024   CREATININE 0.88 01/18/2024   GFR 69.57 01/18/2024   CALCIUM 9.0 01/18/2024   PHOS 4.4 01/30/2012   PROT 7.3 12/30/2023   ALBUMIN  4.6 12/30/2023   BILITOT 0.3 12/30/2023   ALKPHOS 82 12/30/2023   AST 23 12/30/2023   ALT 15 12/30/2023   ANIONGAP 18 (H) 06/30/2023   Last lipids Lab Results  Component Value Date   CHOL 221 (H) 12/30/2023   HDL 46.20 12/30/2023   LDLCALC 115 (H) 12/30/2023   LDLDIRECT 81.0 09/09/2019   TRIG 300.0 (H) 12/30/2023   CHOLHDL 5 12/30/2023   Last hemoglobin A1c Lab Results  Component Value Date   HGBA1C 7.2 (H) 12/30/2023   Last thyroid  functions Lab Results  Component Value Date   TSH 4.65 12/30/2023      The 10-year ASCVD risk score (Arnett DK, et al., 2019) is: 46.7%    Assessment & Plan:   #1   6-day history of reported dizziness which is intermittent.  This frequently is associated with position change but not described as vertigo.  Blood pressure seated today 150/78 and standing 132/70 so she did have fairly significant change in systolic although not hypotensive. Etiology unclear.  Recent labs no anemia.  Does not appear to be dehydrated.  Heart rhythm is normal.  No evidence for QT prolongation in this patient on methadone   - Recommend repeat basic metabolic panel especially in view of her recent hypokalemia -Change positions slowly - Patient does take Elavil  which has significant anticholinergic effects and might consider tapering back if symptoms persist.  She can discuss with primary  #2 hypokalemia.  Patient is on potassium replacement.  Recheck basic metabolic panel.  #3 recent transient chest pain at rest.   Multiple risk factors for CAD.  EKG today shows no acute changes.  We did discuss possible coronary calcium score to further risk stratify.  She is interested in pursuing this.  40 minutes were spent between face-to-face and non-face-to-face time evaluating her medical issues as above and reviewing records  No follow-ups on file.    Wolm Scarlet, MD

## 2024-03-22 NOTE — Patient Instructions (Signed)
 I will be ordering coronary calcium score to further risk stratify for coronary disease.

## 2024-03-23 LAB — BASIC METABOLIC PANEL WITH GFR
BUN: 17 mg/dL (ref 6–23)
CO2: 35 meq/L — ABNORMAL HIGH (ref 19–32)
Calcium: 9.9 mg/dL (ref 8.4–10.5)
Chloride: 96 meq/L (ref 96–112)
Creatinine, Ser: 1.18 mg/dL (ref 0.40–1.20)
GFR: 48.87 mL/min — ABNORMAL LOW (ref >60.00)
Glucose, Bld: 89 mg/dL (ref 70–99)
Potassium: 3.2 meq/L — ABNORMAL LOW (ref 3.5–5.1)
Sodium: 142 meq/L (ref 135–145)

## 2024-04-11 ENCOUNTER — Ambulatory Visit
Admission: RE | Admit: 2024-04-11 | Discharge: 2024-04-11 | Disposition: A | Discharge status: Home / Self Care | Source: Ambulatory Visit | Attending: Family Medicine | Admitting: Family Medicine

## 2024-04-11 DIAGNOSIS — Z1231 Encounter for screening mammogram for malignant neoplasm of breast: Secondary | ICD-10-CM | POA: Diagnosis not present

## 2024-04-25 DIAGNOSIS — Z96652 Presence of left artificial knee joint: Secondary | ICD-10-CM | POA: Diagnosis not present

## 2024-04-25 DIAGNOSIS — F17210 Nicotine dependence, cigarettes, uncomplicated: Secondary | ICD-10-CM | POA: Diagnosis not present

## 2024-04-25 DIAGNOSIS — Y831 Surgical operation with implant of artificial internal device as the cause of abnormal reaction of the patient, or of later complication, without mention of misadventure at the time of the procedure: Secondary | ICD-10-CM | POA: Diagnosis not present

## 2024-04-25 DIAGNOSIS — M25562 Pain in left knee: Secondary | ICD-10-CM | POA: Diagnosis not present

## 2024-04-25 DIAGNOSIS — T8484XD Pain due to internal orthopedic prosthetic devices, implants and grafts, subsequent encounter: Secondary | ICD-10-CM | POA: Diagnosis not present

## 2024-04-25 DIAGNOSIS — Z96659 Presence of unspecified artificial knee joint: Secondary | ICD-10-CM | POA: Diagnosis not present

## 2024-05-02 ENCOUNTER — Ambulatory Visit (HOSPITAL_BASED_OUTPATIENT_CLINIC_OR_DEPARTMENT_OTHER)

## 2024-05-02 DIAGNOSIS — M545 Low back pain, unspecified: Secondary | ICD-10-CM | POA: Diagnosis not present

## 2024-05-02 DIAGNOSIS — M47812 Spondylosis without myelopathy or radiculopathy, cervical region: Secondary | ICD-10-CM | POA: Diagnosis not present

## 2024-05-02 DIAGNOSIS — M25562 Pain in left knee: Secondary | ICD-10-CM | POA: Diagnosis not present

## 2024-05-02 DIAGNOSIS — M150 Primary generalized (osteo)arthritis: Secondary | ICD-10-CM | POA: Diagnosis not present

## 2024-05-03 ENCOUNTER — Ambulatory Visit: Admitting: Orthopaedic Surgery

## 2024-05-03 ENCOUNTER — Encounter: Payer: Self-pay | Admitting: Orthopaedic Surgery

## 2024-05-03 DIAGNOSIS — M65312 Trigger thumb, left thumb: Secondary | ICD-10-CM | POA: Diagnosis not present

## 2024-05-03 NOTE — Progress Notes (Signed)
 Office Visit Note   Patient: [name]           Date of Birth: November 07, 1960           MRN: [id number] Visit Date: 05/03/2024              Requested by: Johnny Garnette LABOR, MD [address] [place],  [place] [address] PCP: Johnny Garnette LABOR, MD   Assessment & Plan: Visit Diagnoses:  1. Trigger thumb, left thumb     Plan: History of Present Illness [name] is a 63 year old female who presents with left thumb locking and tenderness.  She experiences symptoms of trigger thumb in her left hand, similar to a previous episode in her right hand. The thumb is tender and locks. She previously underwent surgery for trigger thumb on her right hand, which resolved her symptoms. She is 'borderline' diabetic but is not on any blood thinners. She tried various treatments for her right thumb, including injections and wearing a brace, before opting for surgery.  Physical Exam MUSCULOSKELETAL: Tenderness and locking in the left thumb consistent with trigger thumb  Assessment and Plan Left thumb trigger finger Recurrent trigger finger with locking and tenderness. Prefers surgical intervention for definitive resolution. - Schedule surgical intervention at [facility]. - Detailed surgical plan discussed including risk benefits prognosis  Follow-Up Instructions: No follow-ups on file.   Orders:  No orders of the defined types were placed in this encounter.  No orders of the defined types were placed in this encounter.     Procedures: No procedures performed   Clinical Data: No additional findings.   Subjective: Chief Complaint  Patient presents with   Left Hand - Pain    Thumb    HPI  Review of Systems  Constitutional: Negative.   HENT: Negative.    Eyes: Negative.   Respiratory: Negative.    Cardiovascular: Negative.   Endocrine: Negative.   Musculoskeletal: Negative.   Neurological: Negative.   Hematological: Negative.    Psychiatric/Behavioral: Negative.    All other systems reviewed and are negative.    Objective: Vital Signs: There were no vitals taken for this visit.  Physical Exam Vitals and nursing note reviewed.  Constitutional:      Appearance: She is well-developed.  HENT:     Head: Atraumatic.     Nose: Nose normal.  Eyes:     Extraocular Movements: Extraocular movements intact.  Cardiovascular:     Pulses: Normal pulses.  Pulmonary:     Effort: Pulmonary effort is normal.  Abdominal:     Palpations: Abdomen is soft.  Musculoskeletal:     Cervical back: Neck supple.  Skin:    General: Skin is warm.     Capillary Refill: Capillary refill takes less than 2 seconds.  Neurological:     Mental Status: She is alert. Mental status is at baseline.  Psychiatric:        Behavior: Behavior normal.        Thought Content: Thought content normal.        Judgment: Judgment normal.     Ortho Exam  Specialty Comments:  No specialty comments available.  Imaging: No results found.   PMFS History: Patient Active Problem List   Diagnosis Date Noted   Trigger thumb, left thumb 05/03/2024   Trigger thumb, right thumb 05/14/2022   Impingement syndrome of right shoulder 05/14/2022   Carpal tunnel syndrome, left upper limb 08/23/2021   Neuropathy 10/19/2020  DDD (degenerative disc disease), lumbar 07/16/2018    Class: Chronic   Other spondylosis with radiculopathy, lumbar region 07/16/2018    Class: Chronic   S/P lumbar fusion 07/13/2018   Tenosynovitis, de Quervain 08/10/2017   Multiple lipomas 07/15/2017   Insomnia 07/16/2016   Morbid obesity with BMI of 40.0-44.9, adult (HCC) 10/26/2015   Malignant neoplasm of lower-inner quadrant of right breast of female, estrogen receptor positive (HCC) 08/08/2015   S/P revision of total knee 07/24/2015   Loose total knee arthroplasty (HCC) 07/02/2015   Type 2 diabetes mellitus without complications (HCC) 03/05/2015   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/11/2014   Paraspinous mass, left 03/31/2013   Other symptoms and signs involving the nervous system 03/31/2013   Internal hemorrhoid 02/17/2013   Neoplasm of soft tissue, left shoulder and left upper arm 08/03/2012   Cholelithiasis with cholecystitis 10/08/2011   Hypokalemia 09/17/2011   Gastro-esophageal reflux disease without esophagitis 08/08/2010   Gout 12/18/2008   Essential (primary) hypertension 03/15/2007   OA (osteoarthritis) of knee 03/15/2007   HEADACHE 03/15/2007   Past Medical History:  Diagnosis Date   Breast cancer (HCC) 2017   right breast   Breast cancer of lower-inner quadrant of right female breast (HCC) 08/08/2015   Colon polyps    DDD (degenerative disc disease), cervical    Degenerative joint disease    Diabetes mellitus without complication (HCC)    Diverticulosis    Gallstones    GERD (gastroesophageal reflux disease)    Headache(784.0)    migraine like per patient   Hypertension    Low back pain    dr orlando, pain management   Osteoarthritis    Personal history of chemotherapy 2017   Personal history of radiation therapy 2017    Family History  Problem Relation Age of Onset   Stomach cancer Mother    Heart disease Father    Prostate cancer Father    Kidney cancer Sister        one out of the four   Hypertension Sister    Hypertension Brother    Colon cancer Maternal Grandmother    Crohn's disease Other        nephew   Esophageal cancer Neg Hx    Colon polyps Neg Hx    Rectal cancer Neg Hx     Past Surgical History:  Procedure Laterality Date   CARPAL TUNNEL RELEASE Right 09/30/2021   Procedure: Right CARPAL TUNNEL RELEASE;  Surgeon: Romona Harari, MD;  Location: [facility];  Service: Orthopedics;  Laterality: Right;   CHOLECYSTECTOMY  10/28/2011   Procedure: LAPAROSCOPIC CHOLECYSTECTOMY WITH INTRAOPERATIVE CHOLANGIOGRAM;  Surgeon: Krystal CHRISTELLA Spinner, MD;  Location: [facility];  Service: General;  Laterality:  N/A;   COLONOSCOPY  04/21/2023   per Dr. Abran, adenmatous polyps, repeat in 5 yrs   COLONOSCOPY WITH ESOPHAGOGASTRODUODENOSCOPY (EGD)  04/13/2018   per Dr. Abran, adenomatous polyp, repeat in 5 yrs    EXAM UNDER ANESTHESIA WITH MANIPULATION OF KNEE Right 05/30/2003   EXAM UNDER ANESTHESIA WITH MANIPULATION OF KNEE Left 12/27/2002   GANGLION CYST EXCISION Right    right wrist   KNEE ARTHROSCOPY Bilateral 01/02/2000   KNEE ARTHROSCOPY Right    LAPAROSCOPIC VAGINAL HYSTERECTOMY WITH SALPINGO OOPHORECTOMY Bilateral 07/13/2006   LIPOMA EXCISION Right 02/21/2008   hip   MASS EXCISION  08/24/2012   Procedure: EXCISION MASS;  Surgeon: Krystal CHRISTELLA Spinner, MD;  Location: [facility];  Service: General;  Laterality: Left;  excisie  soft tissue masses left shoulder(back) & left upper arm   MASS EXCISION Left 01/24/2014   Procedure: EXCISION SOFT TISSUE MASSES LOWER LEFT BACK;  Surgeon: Krystal CHRISTELLA Spinner, MD;  Location: [facility];  Service: General;  Laterality: Left;   MASS EXCISION Right 04/30/2016   Procedure: EXCISION OF SKIN RIGHT CHEST WALL;  Surgeon: Deward Null III, MD;  Location: [facility];  Service: General;  Laterality: Right;  EXCISION OF SKIN RIGHT CHEST WALL   MASTECTOMY Right 2017   MASTECTOMY W/ SENTINEL NODE BIOPSY Right 10/01/2015   Procedure: RIGHT MASTECTOMY WITH SENTINEL LYMPH NODE BIOPSY;  Surgeon: Deward Null III, MD;  Location: [facility];  Service: General;  Laterality: Right;   PORT-A-CATH REMOVAL Left 04/30/2016   Procedure: REMOVAL PORT-A-CATH;  Surgeon: Deward Null III, MD;  Location: [facility];  Service: General;  Laterality: Left;  REMOVAL PORT-A-CATH   PORTACATH PLACEMENT Left 11/01/2015   Procedure: INSERTION PORT-A-CATH;  Surgeon: Deward Null III, MD;  Location: [facility];  Service: General;  Laterality: Left;   POSTERIOR FUSION LUMBAR SPINE  07/13/2018   per Dr. Lucilla, at L4-5 and L5-S1   REPLACEMENT  UNICONDYLAR JOINT KNEE Left 04/20/2001   REVISION TOTAL KNEE ARTHROPLASTY Right 06/18/2004   REVISION TOTAL KNEE ARTHROPLASTY Left 11/15/2002   REVISION TOTAL KNEE ARTHROPLASTY Left 10/12/2001   REVISION TOTAL KNEE ARTHROPLASTY Right 07/09/2015   TONSILLECTOMY     TOTAL KNEE ARTHROPLASTY Right 04/25/2003   TOTAL KNEE ARTHROPLASTY Left 06/25/2009   Social History   Occupational History   Occupation: retired/disabled  Tobacco Use   Smoking status: Every Day    Current packs/day: 0.00    Average packs/day: 0.3 packs/day for 20.0 years (5.0 ttl pk-yrs)    Types: Cigarettes    Start date: 05/08/2000    Last attempt to quit: 05/08/2020    Years since quitting: 3.9   Smokeless tobacco: Former    Quit date: 09/30/2015   Tobacco comments:    smokes about 4 cigs/day; working on quitting - 08/22/16 gwd  Vaping Use   Vaping status: Never Used  Substance and Sexual Activity   Alcohol use: No    Alcohol/week: 0.0 standard drinks of alcohol   Drug use: No   Sexual activity: Yes    Birth control/protection: Post-menopausal

## 2024-05-13 ENCOUNTER — Other Ambulatory Visit (HOSPITAL_BASED_OUTPATIENT_CLINIC_OR_DEPARTMENT_OTHER): Payer: Self-pay

## 2024-05-25 ENCOUNTER — Other Ambulatory Visit: Payer: Self-pay | Admitting: Family Medicine

## 2024-05-25 DIAGNOSIS — I10 Essential (primary) hypertension: Secondary | ICD-10-CM

## 2024-05-30 ENCOUNTER — Ambulatory Visit (HOSPITAL_BASED_OUTPATIENT_CLINIC_OR_DEPARTMENT_OTHER)

## 2024-05-30 DIAGNOSIS — Z96652 Presence of left artificial knee joint: Secondary | ICD-10-CM | POA: Diagnosis not present

## 2024-05-30 DIAGNOSIS — Z471 Aftercare following joint replacement surgery: Secondary | ICD-10-CM | POA: Diagnosis not present

## 2024-05-30 DIAGNOSIS — T8484XA Pain due to internal orthopedic prosthetic devices, implants and grafts, initial encounter: Secondary | ICD-10-CM | POA: Diagnosis not present

## 2024-06-07 ENCOUNTER — Ambulatory Visit: Admitting: Family Medicine

## 2024-06-09 ENCOUNTER — Other Ambulatory Visit: Payer: Self-pay | Admitting: Family Medicine

## 2024-06-09 DIAGNOSIS — I10 Essential (primary) hypertension: Secondary | ICD-10-CM

## 2024-06-14 ENCOUNTER — Ambulatory Visit: Admitting: Family Medicine

## 2024-06-14 ENCOUNTER — Other Ambulatory Visit (HOSPITAL_BASED_OUTPATIENT_CLINIC_OR_DEPARTMENT_OTHER)

## 2024-06-20 ENCOUNTER — Ambulatory Visit: Admitting: Family Medicine

## 2024-06-28 DIAGNOSIS — M47812 Spondylosis without myelopathy or radiculopathy, cervical region: Secondary | ICD-10-CM | POA: Diagnosis not present

## 2024-06-28 DIAGNOSIS — M25562 Pain in left knee: Secondary | ICD-10-CM | POA: Diagnosis not present

## 2024-06-28 DIAGNOSIS — M150 Primary generalized (osteo)arthritis: Secondary | ICD-10-CM | POA: Diagnosis not present

## 2024-06-28 DIAGNOSIS — M545 Low back pain, unspecified: Secondary | ICD-10-CM | POA: Diagnosis not present

## 2024-07-15 ENCOUNTER — Other Ambulatory Visit: Payer: Self-pay | Admitting: Physician Assistant

## 2024-07-15 MED ORDER — HYDROCODONE-ACETAMINOPHEN 5-325 MG PO TABS: 1.0000 | ORAL_TABLET | Freq: Three times a day (TID) | ORAL | 0 refills | Status: AC | PRN

## 2024-07-15 MED ORDER — ONDANSETRON HCL 4 MG PO TABS: 4.0000 mg | ORAL_TABLET | Freq: Three times a day (TID) | ORAL | 0 refills | Status: AC | PRN

## 2024-07-20 ENCOUNTER — Encounter (HOSPITAL_BASED_OUTPATIENT_CLINIC_OR_DEPARTMENT_OTHER): Payer: Self-pay | Admitting: Orthopaedic Surgery

## 2024-07-20 ENCOUNTER — Other Ambulatory Visit: Payer: Self-pay

## 2024-07-25 ENCOUNTER — Encounter: Payer: Self-pay | Admitting: Radiology

## 2024-07-25 ENCOUNTER — Encounter (HOSPITAL_BASED_OUTPATIENT_CLINIC_OR_DEPARTMENT_OTHER)
Admission: RE | Admit: 2024-07-25 | Discharge: 2024-07-25 | Disposition: A | Discharge status: Home / Self Care | Source: Ambulatory Visit | Attending: Orthopaedic Surgery | Admitting: Orthopaedic Surgery

## 2024-07-25 ENCOUNTER — Ambulatory Visit: Payer: Self-pay | Admitting: Orthopaedic Surgery

## 2024-07-25 ENCOUNTER — Other Ambulatory Visit: Payer: Self-pay | Admitting: Orthopaedic Surgery

## 2024-07-25 DIAGNOSIS — E119 Type 2 diabetes mellitus without complications: Secondary | ICD-10-CM | POA: Diagnosis not present

## 2024-07-25 DIAGNOSIS — M65842 Other synovitis and tenosynovitis, left hand: Secondary | ICD-10-CM | POA: Diagnosis not present

## 2024-07-25 DIAGNOSIS — M1990 Unspecified osteoarthritis, unspecified site: Secondary | ICD-10-CM | POA: Diagnosis not present

## 2024-07-25 DIAGNOSIS — Z7984 Long term (current) use of oral hypoglycemic drugs: Secondary | ICD-10-CM | POA: Diagnosis not present

## 2024-07-25 DIAGNOSIS — Z01812 Encounter for preprocedural laboratory examination: Secondary | ICD-10-CM | POA: Insufficient documentation

## 2024-07-25 DIAGNOSIS — I10 Essential (primary) hypertension: Secondary | ICD-10-CM | POA: Diagnosis not present

## 2024-07-25 DIAGNOSIS — F17210 Nicotine dependence, cigarettes, uncomplicated: Secondary | ICD-10-CM | POA: Diagnosis not present

## 2024-07-25 DIAGNOSIS — M65312 Trigger thumb, left thumb: Secondary | ICD-10-CM | POA: Diagnosis not present

## 2024-07-25 DIAGNOSIS — Z79899 Other long term (current) drug therapy: Secondary | ICD-10-CM | POA: Diagnosis not present

## 2024-07-25 DIAGNOSIS — K219 Gastro-esophageal reflux disease without esophagitis: Secondary | ICD-10-CM | POA: Diagnosis not present

## 2024-07-25 LAB — BASIC METABOLIC PANEL WITH GFR
Anion gap: 15 (ref 5–15)
BUN: 14 mg/dL (ref 8–23)
CO2: 28 mmol/L (ref 22–32)
Calcium: 9.1 mg/dL (ref 8.9–10.3)
Chloride: 98 mmol/L (ref 98–111)
Creatinine, Ser: 1.07 mg/dL — ABNORMAL HIGH (ref 0.44–1.00)
GFR, Estimated: 58 mL/min — ABNORMAL LOW (ref >60)
Glucose, Bld: 138 mg/dL — ABNORMAL HIGH (ref 70–99)
Potassium: 2.7 mmol/L — CL (ref 3.5–5.1)
Sodium: 141 mmol/L (ref 135–145)

## 2024-07-25 MED ORDER — POTASSIUM CHLORIDE CRYS ER 20 MEQ PO TBCR: 40.0000 meq | EXTENDED_RELEASE_TABLET | Freq: Two times a day (BID) | ORAL | 0 refills | Status: AC

## 2024-07-25 NOTE — Progress Notes (Signed)
 K+ 2.7, called patient and she states this has been an issue before. I instructed patient to call PCP and she stated that she would. Dr. Boone notified, will repeat BMET day of surgery.

## 2024-07-25 NOTE — Progress Notes (Signed)

## 2024-07-25 NOTE — Progress Notes (Signed)
 Please let her know I sent potassium chloride  to take prior to surgery due to low potassium.

## 2024-07-26 NOTE — Progress Notes (Signed)
Called patient. Patient aware.

## 2024-07-27 ENCOUNTER — Ambulatory Visit (HOSPITAL_BASED_OUTPATIENT_CLINIC_OR_DEPARTMENT_OTHER): Admitting: Anesthesiology

## 2024-07-27 ENCOUNTER — Telehealth: Payer: Self-pay

## 2024-07-27 ENCOUNTER — Encounter (HOSPITAL_BASED_OUTPATIENT_CLINIC_OR_DEPARTMENT_OTHER): Payer: Self-pay | Admitting: Orthopaedic Surgery

## 2024-07-27 ENCOUNTER — Other Ambulatory Visit: Payer: Self-pay

## 2024-07-27 ENCOUNTER — Encounter (HOSPITAL_BASED_OUTPATIENT_CLINIC_OR_DEPARTMENT_OTHER)
Admission: RE | Disposition: A | Discharge status: Home / Self Care | Payer: Self-pay | Source: Home / Self Care | Attending: Orthopaedic Surgery

## 2024-07-27 ENCOUNTER — Ambulatory Visit (HOSPITAL_BASED_OUTPATIENT_CLINIC_OR_DEPARTMENT_OTHER)
Admission: RE | Admit: 2024-07-27 | Discharge: 2024-07-27 | Disposition: A | Discharge status: Home / Self Care | Attending: Orthopaedic Surgery | Admitting: Orthopaedic Surgery

## 2024-07-27 DIAGNOSIS — E119 Type 2 diabetes mellitus without complications: Secondary | ICD-10-CM | POA: Diagnosis not present

## 2024-07-27 DIAGNOSIS — F17210 Nicotine dependence, cigarettes, uncomplicated: Secondary | ICD-10-CM | POA: Diagnosis not present

## 2024-07-27 DIAGNOSIS — M65842 Other synovitis and tenosynovitis, left hand: Secondary | ICD-10-CM | POA: Diagnosis not present

## 2024-07-27 DIAGNOSIS — I10 Essential (primary) hypertension: Secondary | ICD-10-CM | POA: Insufficient documentation

## 2024-07-27 DIAGNOSIS — Z01818 Encounter for other preprocedural examination: Secondary | ICD-10-CM

## 2024-07-27 DIAGNOSIS — Z79899 Other long term (current) drug therapy: Secondary | ICD-10-CM | POA: Insufficient documentation

## 2024-07-27 DIAGNOSIS — M65312 Trigger thumb, left thumb: Secondary | ICD-10-CM | POA: Insufficient documentation

## 2024-07-27 DIAGNOSIS — K219 Gastro-esophageal reflux disease without esophagitis: Secondary | ICD-10-CM | POA: Diagnosis not present

## 2024-07-27 DIAGNOSIS — M1990 Unspecified osteoarthritis, unspecified site: Secondary | ICD-10-CM | POA: Diagnosis not present

## 2024-07-27 DIAGNOSIS — Z7984 Long term (current) use of oral hypoglycemic drugs: Secondary | ICD-10-CM | POA: Insufficient documentation

## 2024-07-27 HISTORY — DX: Other chronic pain: G89.29

## 2024-07-27 HISTORY — PX: TRIGGER FINGER RELEASE: SHX641

## 2024-07-27 LAB — BASIC METABOLIC PANEL WITH GFR
Anion gap: 15 (ref 5–15)
BUN: 13 mg/dL (ref 8–23)
CO2: 30 mmol/L (ref 22–32)
Calcium: 8.9 mg/dL (ref 8.9–10.3)
Chloride: 95 mmol/L — ABNORMAL LOW (ref 98–111)
Creatinine, Ser: 0.85 mg/dL (ref 0.44–1.00)
GFR, Estimated: >60 mL/min (ref >60)
Glucose, Bld: 97 mg/dL (ref 70–99)
Potassium: 2.9 mmol/L — ABNORMAL LOW (ref 3.5–5.1)
Sodium: 140 mmol/L (ref 135–145)

## 2024-07-27 LAB — GLUCOSE, CAPILLARY: Glucose-Capillary: 97 mg/dL (ref 70–99)

## 2024-07-27 SURGERY — RELEASE, A1 PULLEY, FOR TRIGGER FINGER
Anesthesia: Monitor Anesthesia Care | Site: Thumb | Laterality: Left

## 2024-07-27 MED ORDER — LIDOCAINE HCL (CARDIAC) PF 100 MG/5ML IV SOSY
PREFILLED_SYRINGE | INTRAVENOUS | Status: DC | PRN
Start: 2024-07-27 — End: 2024-07-27
  Administered 2024-07-27: 10 mg via INTRAVENOUS

## 2024-07-27 MED ORDER — CEFAZOLIN SODIUM-DEXTROSE 2-4 GM/100ML-% IV SOLN
INTRAVENOUS | Status: AC
  Filled 2024-07-27: qty 100

## 2024-07-27 MED ORDER — CEFAZOLIN SODIUM-DEXTROSE 2-4 GM/100ML-% IV SOLN
2.0000 g | INTRAVENOUS | Status: AC
  Administered 2024-07-27: 2 g via INTRAVENOUS

## 2024-07-27 MED ORDER — SUCCINYLCHOLINE CHLORIDE 200 MG/10ML IV SOSY
PREFILLED_SYRINGE | INTRAVENOUS | Status: AC
  Filled 2024-07-27: qty 10

## 2024-07-27 MED ORDER — ONDANSETRON HCL 4 MG/2ML IJ SOLN
INTRAMUSCULAR | Status: DC | PRN
  Administered 2024-07-27: 4 mg via INTRAVENOUS

## 2024-07-27 MED ORDER — 0.9 % SODIUM CHLORIDE (POUR BTL) OPTIME
TOPICAL | Status: DC | PRN
  Administered 2024-07-27: 1000 mL

## 2024-07-27 MED ORDER — MIDAZOLAM HCL 5 MG/5ML IJ SOLN
INTRAMUSCULAR | Status: DC | PRN
  Administered 2024-07-27: 1 mg via INTRAVENOUS

## 2024-07-27 MED ORDER — FENTANYL CITRATE (PF) 100 MCG/2ML IJ SOLN
INTRAMUSCULAR | Status: AC
  Filled 2024-07-27: qty 2

## 2024-07-27 MED ORDER — MIDAZOLAM HCL 2 MG/2ML IJ SOLN
INTRAMUSCULAR | Status: AC
  Filled 2024-07-27: qty 2

## 2024-07-27 MED ORDER — PROPOFOL 500 MG/50ML IV EMUL
INTRAVENOUS | Status: DC | PRN
  Administered 2024-07-27: 100 ug/kg/min via INTRAVENOUS

## 2024-07-27 MED ORDER — LIDOCAINE-EPINEPHRINE (PF) 1 %-1:200000 IJ SOLN
INTRAMUSCULAR | Status: DC | PRN
  Administered 2024-07-27: 10 mL

## 2024-07-27 MED ORDER — FENTANYL CITRATE (PF) 100 MCG/2ML IJ SOLN
INTRAMUSCULAR | Status: DC | PRN
  Administered 2024-07-27: 50 ug via INTRAVENOUS

## 2024-07-27 MED ORDER — LACTATED RINGERS IV SOLN: INTRAVENOUS | Status: DC

## 2024-07-27 MED ORDER — PROPOFOL 10 MG/ML IV BOLUS
INTRAVENOUS | Status: DC | PRN
  Administered 2024-07-27: 40 mg via INTRAVENOUS

## 2024-07-27 SURGICAL SUPPLY — 38 items
BAND RUBBER #18 3X1/16 STRL (MISCELLANEOUS) ×2 IMPLANT
BLADE SURG 15 STRL LF DISP TIS (BLADE) ×1 IMPLANT
BNDG COMPR ESMARK 4X3 LF (GAUZE/BANDAGES/DRESSINGS) IMPLANT
BNDG ELASTIC 3INX 5YD STR LF (GAUZE/BANDAGES/DRESSINGS) ×1 IMPLANT
BNDG ELASTIC 4INX 5YD STR LF (GAUZE/BANDAGES/DRESSINGS) IMPLANT
BRUSH SCRUB EZ PLAIN DRY (MISCELLANEOUS) ×1 IMPLANT
CANISTER SUCT 1200ML W/VALVE (MISCELLANEOUS) ×1 IMPLANT
CORD BIPOLAR FORCEPS 12FT (ELECTRODE) ×1 IMPLANT
COVER BACK TABLE 60X90IN (DRAPES) ×1 IMPLANT
COVER MAYO STAND STRL (DRAPES) ×1 IMPLANT
CUFF TOURN SGL QUICK 18X4 (TOURNIQUET CUFF) ×1 IMPLANT
DRAPE EXTREMITY T 121X128X90 (DISPOSABLE) ×1 IMPLANT
DRAPE SURG 17X23 STRL (DRAPES) ×1 IMPLANT
GAUZE SPONGE 4X4 12PLY STRL (GAUZE/BANDAGES/DRESSINGS) ×1 IMPLANT
GAUZE XEROFORM 1X8 LF (GAUZE/BANDAGES/DRESSINGS) ×1 IMPLANT
GLOVE BIOGEL PI IND STRL 7.5 (GLOVE) ×1 IMPLANT
GLOVE ECLIPSE 7.0 STRL STRAW (GLOVE) ×1 IMPLANT
GLOVE INDICATOR 7.0 STRL GRN (GLOVE) ×1 IMPLANT
GLOVE SURG SYN 7.5 PF PI (GLOVE) ×1 IMPLANT
GOWN STRL REUS W/ TWL XL LVL3 (GOWN DISPOSABLE) ×1 IMPLANT
GOWN STRL SURGICAL XL XLNG (GOWN DISPOSABLE) ×1 IMPLANT
NDL HYPO 25X1 1.5 SAFETY (NEEDLE) ×1 IMPLANT
NEEDLE HYPO 25X1 1.5 SAFETY (NEEDLE) ×1 IMPLANT
PACK BASIN DAY SURGERY FS (CUSTOM PROCEDURE TRAY) ×1 IMPLANT
PAD CAST 3X4 CTTN HI CHSV (CAST SUPPLIES) ×1 IMPLANT
PADDING CAST ABS COTTON 4X4 ST (CAST SUPPLIES) IMPLANT
SHEET MEDIUM DRAPE 40X70 STRL (DRAPES) ×1 IMPLANT
SOLN 0.9% NACL POUR BTL 1000ML (IV SOLUTION) ×1 IMPLANT
SPIKE FLUID TRANSFER (MISCELLANEOUS) IMPLANT
STOCKINETTE 4X48 STRL (DRAPES) ×1 IMPLANT
SUT ETHILON 3 0 PS 1 (SUTURE) IMPLANT
SUT ETHILON 4 0 PS 2 18 (SUTURE) ×1 IMPLANT
SYR BULB EAR ULCER 3OZ GRN STR (SYRINGE) ×1 IMPLANT
SYR CONTROL 10ML LL (SYRINGE) ×1 IMPLANT
TOWEL GREEN STERILE FF (TOWEL DISPOSABLE) ×1 IMPLANT
TRAY DSU PREP LF (CUSTOM PROCEDURE TRAY) ×1 IMPLANT
TUBE CONNECTING 20X1/4 (TUBING) ×1 IMPLANT
UNDERPAD 30X36 HEAVY ABSORB (UNDERPADS AND DIAPERS) ×1 IMPLANT

## 2024-07-27 NOTE — Op Note (Signed)
   Date of Surgery: 07/27/2024  INDICATIONS: [name] is a 64 y.o.-year-old female who presents for surgical treatment of stenosing tenosynovitis of left thumb ;  The patient did consent to the procedure after discussion of the risks and benefits.  PREOPERATIVE DIAGNOSIS: Stenosing tenosynovitis left thumb  POSTOPERATIVE DIAGNOSIS: Same.  PROCEDURE: Tenolysis of left FPL tendon with release of A1 pulley  SURGEON: Mika Griffitts Ozell [name], M.D.  ASSIST: Ronal Jacobsen [place], [place]; necessary for the timely completion of procedure and due to complexity of procedure..  ANESTHESIA:  Bier block  IV FLUIDS AND URINE: See anesthesia.  ESTIMATED BLOOD LOSS: minimal mL  COMPLICATIONS: None.  DESCRIPTION OF PROCEDURE: The patient was identified in the preoperative holding area. The operative site was marked by the surgeon confirmed with the patient. He is brought back to the operating room. The patient was placed supine on table. A nonsterile tourniquet was placed on the arm. Local anesthetic was placed in the planned operative site. The operative extremity was prepped and draped in standard sterile fashion. Timeout was performed. Antibiotics were given. Timeout was performed.  Tourniquet was inflated to 250 mmHg.  A transverse incision was made over the metacarpal head of the thumb. Blunt dissection was taken down to the flexor tendon sheath. The neurovascular bundles were identified on each side of the tendon sheath and protected.  There was tenosynovitis of the FPL tendon.  Tenolysis was carried out with a tenotomy scissor.  The proximal edge of the A1 pulley was identified. This was sharply incised. Care was taken not to disrupt the A2 or oblique pulley. The tourniquet was then deflated and hemostasis was obtained.  The wound was thoroughly irrigated and closed with 3-0 nylon sutures. Sterile dressings were applied and the hand was placed in a soft dressing. Patient tolerated the procedure well and was  taken to the PACU in stable condition.  POSTOPERATIVE PLAN: Patient will be weight bearing as tolerated and to avoid heavy lifting for 4 weeks.    Diane Ozell Cummins, MD 1:28 PM

## 2024-07-27 NOTE — Anesthesia Preprocedure Evaluation (Addendum)
 Anesthesia Evaluation  Patient identified by MRN, date of birth, ID band Patient awake    Reviewed: Allergy & Precautions, NPO status , Patient's Chart, lab work & pertinent test results  Airway Mallampati: III  TM Distance: >3 FB Neck ROM: Full    Dental  (+) Teeth Intact, Dental Advisory Given   Pulmonary Current Smoker and Patient abstained from smoking.   breath sounds clear to auscultation       Cardiovascular hypertension, Pt. on medications and Pt. on home beta blockers  Rhythm:Regular Rate:Normal     Neuro/Psych  Headaches  Neuromuscular disease  negative psych ROS   GI/Hepatic Neg liver ROS,GERD  Medicated,,  Endo/Other  diabetes, Type 2, Oral Hypoglycemic Agents    Renal/GU negative Renal ROS     Musculoskeletal  (+) Arthritis ,    Abdominal   Peds  Hematology negative hematology ROS (+)   Anesthesia Other Findings   Reproductive/Obstetrics                              Anesthesia Physical Anesthesia Plan  ASA: 3  Anesthesia Plan: MAC   Post-op Pain Management: Tylenol  PO (pre-op)*   Induction: Intravenous  PONV Risk Score and Plan: 2 and Ondansetron , Propofol  infusion and Midazolam   Airway Management Planned: Natural Airway and Nasal Cannula  Additional Equipment: None  Intra-op Plan:   Post-operative Plan:   Informed Consent: I have reviewed the patients History and Physical, chart, labs and discussed the procedure including the risks, benefits and alternatives for the proposed anesthesia with the patient or authorized representative who has indicated his/her understanding and acceptance.       Plan Discussed with: CRNA  Anesthesia Plan Comments:          Anesthesia Quick Evaluation

## 2024-07-27 NOTE — H&P (Signed)
 PREOPERATIVE H&P  Chief Complaint: trigger finger of left thumb  HPI: [name] is a 63 y.o. female who presents for surgical treatment of trigger finger of left thumb.  She denies any changes in medical history.  Past Surgical History:  Procedure Laterality Date  . CARPAL TUNNEL RELEASE Right 09/30/2021   Procedure: Right CARPAL TUNNEL RELEASE;  Surgeon: Romona Harari, MD;  Location: [facility];  Service: Orthopedics;  Laterality: Right;  . CHOLECYSTECTOMY  10/28/2011   Procedure: LAPAROSCOPIC CHOLECYSTECTOMY WITH INTRAOPERATIVE CHOLANGIOGRAM;  Surgeon: Krystal CHRISTELLA Spinner, MD;  Location: [facility];  Service: General;  Laterality: N/A;  . COLONOSCOPY  04/21/2023   per Dr. Abran, adenmatous polyps, repeat in 5 yrs  . COLONOSCOPY WITH ESOPHAGOGASTRODUODENOSCOPY (EGD)  04/13/2018   per Dr. Abran, adenomatous polyp, repeat in 5 yrs   . EXAM UNDER ANESTHESIA WITH MANIPULATION OF KNEE Right 05/30/2003  . EXAM UNDER ANESTHESIA WITH MANIPULATION OF KNEE Left 12/27/2002  . GANGLION CYST EXCISION Right    right wrist  . KNEE ARTHROSCOPY Bilateral 01/02/2000  . KNEE ARTHROSCOPY Right   . LAPAROSCOPIC VAGINAL HYSTERECTOMY WITH SALPINGO OOPHORECTOMY Bilateral 07/13/2006  . LIPOMA EXCISION Right 02/21/2008   hip  . MASS EXCISION  08/24/2012   Procedure: EXCISION MASS;  Surgeon: Krystal CHRISTELLA Spinner, MD;  Location: [facility];  Service: General;  Laterality: Left;  excisie soft tissue masses left shoulder(back) & left upper arm  . MASS EXCISION Left 01/24/2014   Procedure: EXCISION SOFT TISSUE MASSES LOWER LEFT BACK;  Surgeon: Krystal CHRISTELLA Spinner, MD;  Location: [facility];  Service: General;  Laterality: Left;  SABRA MASS EXCISION Right 04/30/2016   Procedure: EXCISION OF SKIN RIGHT CHEST WALL;  Surgeon: Deward Null III, MD;  Location: [facility];  Service: General;  Laterality: Right;  EXCISION OF SKIN RIGHT CHEST WALL  . MASTECTOMY Right 2017   . MASTECTOMY W/ SENTINEL NODE BIOPSY Right 10/01/2015   Procedure: RIGHT MASTECTOMY WITH SENTINEL LYMPH NODE BIOPSY;  Surgeon: Deward Null III, MD;  Location: [facility];  Service: General;  Laterality: Right;  . PORT-A-CATH REMOVAL Left 04/30/2016   Procedure: REMOVAL PORT-A-CATH;  Surgeon: Deward Null III, MD;  Location: [facility];  Service: General;  Laterality: Left;  REMOVAL PORT-A-CATH  . PORTACATH PLACEMENT Left 11/01/2015   Procedure: INSERTION PORT-A-CATH;  Surgeon: Deward Null III, MD;  Location: [facility];  Service: General;  Laterality: Left;  . POSTERIOR FUSION LUMBAR SPINE  07/13/2018   per Dr. Lucilla, at L4-5 and L5-S1  . REPLACEMENT UNICONDYLAR JOINT KNEE Left 04/20/2001  . REVISION TOTAL KNEE ARTHROPLASTY Right 06/18/2004  . REVISION TOTAL KNEE ARTHROPLASTY Left 11/15/2002  . REVISION TOTAL KNEE ARTHROPLASTY Left 10/12/2001  . REVISION TOTAL KNEE ARTHROPLASTY Right 07/09/2015  . TONSILLECTOMY    . TOTAL KNEE ARTHROPLASTY Right 04/25/2003  . TOTAL KNEE ARTHROPLASTY Left 06/25/2009   Social History   Socioeconomic History  . Marital status: Married    Spouse name: Not on file  . Number of children: 3  . Years of education: Not on file  . Highest education level: Not on file  Occupational History  . Occupation: retired/disabled  Tobacco Use  . Smoking status: Every Day    Current packs/day: 0.00    Average packs/day: 0.3 packs/day for 20.0 years (5.0 ttl pk-yrs)    Types: Cigarettes    Start date: 05/08/2000    Last attempt to quit: 05/08/2020  Years since quitting: 4.2  . Smokeless tobacco: Former    Quit date: 09/30/2015  . Tobacco comments:    smokes about 4 cigs/day; working on quitting - 08/22/16 gwd  Vaping Use  . Vaping status: Never Used  Substance and Sexual Activity  . Alcohol use: No    Alcohol/week: 0.0 standard drinks of alcohol  . Drug use: No  . Sexual activity: Yes    Birth control/protection: Surgical    Comment: hyst   Other Topics Concern  . Not on file  Social History Narrative  . Not on file   Social Drivers of Health   Financial Resource Strain: Low Risk  (04/25/2024)   Received from [facility]   Overall Financial Resource Strain (CARDIA)   . Difficulty of Paying Living Expenses: Not hard at all  Food Insecurity: Not on file  Transportation Needs: No Transportation Needs (04/25/2024)   Received from [facility]   [facility] - Transportation   . In the past 12 months, has lack of transportation kept you from medical appointments or from getting medications?: No   . Lack of Transportation (Non-Medical): No  Physical Activity: Not on file  Stress: Not on file  Social Connections: Not on file   Family History  Problem Relation Age of Onset  . Stomach cancer Mother   . Heart disease Father   . Prostate cancer Father   . Kidney cancer Sister        one out of the four  . Hypertension Sister   . Hypertension Brother   . Colon cancer Maternal Grandmother   . Crohn's disease Other        nephew  . Esophageal cancer Neg Hx   . Colon polyps Neg Hx   . Rectal cancer Neg Hx    Allergies  Allergen Reactions  . Lisinopril  Shortness Of Breath and Swelling    Angioedema  . Codeine Itching, Nausea And Vomiting and Other (See Comments)    Itching all over the body  . Latex Hives  . Sulfonamide Derivatives Hives and Other (See Comments)    All over the body  . Gabapentin  Other (See Comments)    headache  . Lyrica  [Pregabalin ] Other (See Comments)    headache   Prior to Admission medications   Medication Sig Start Date End Date Taking? Authorizing Provider  amitriptyline  (ELAVIL ) 50 MG tablet Take 2 tablets (100 mg total) by mouth at bedtime. 06/29/23  Yes Johnny Garnette LABOR, MD  amLODipine  (NORVASC ) 10 MG tablet TAKE 1 TABLET BY MOUTH EVERY DAY 05/25/24  Yes Johnny Garnette LABOR, MD  HYDROcodone -acetaminophen  (NORCO/VICODIN) 5-325 MG tablet Take 1 tablet by mouth 3 (three)  times daily as needed. To be taken after surgery 07/15/24   Jule Ronal CROME, PA-C  metFORMIN  (GLUCOPHAGE ) 500 MG tablet Take 1 tablet (500 mg total) by mouth 2 (two) times daily with a meal. 12/30/23  Yes Johnny Garnette LABOR, MD  methadone  (DOLOPHINE ) 10 MG tablet Take 10 mg by mouth every 8 (eight) hours.  05/24/19  Yes [provider]  metoprolol  tartrate (LOPRESSOR ) 50 MG tablet TAKE 1 TABLET BY MOUTH TWICE A DAY 03/16/24  Yes Johnny Garnette LABOR, MD  omeprazole  (PRILOSEC) 40 MG capsule TAKE 1 CAPSULE BY MOUTH EVERY DAY 06/09/24  Yes Johnny Garnette LABOR, MD  ondansetron  (ZOFRAN ) 4 MG tablet Take 1 tablet (4 mg total) by mouth every 8 (eight) hours as needed for nausea or vomiting. 07/15/24   Jule Ronal  L, PA-C  Oxycodone  HCl 10 MG TABS Take 1 tablet by mouth every six hours as needed for pain 01/24/22  Yes   potassium chloride  SA (KLOR-CON  M) 20 MEQ tablet Take 2 tablets (40 mEq total) by mouth 2 (two) times daily. 07/25/24   Jerri Kay HERO, MD  zolpidem  (AMBIEN ) 10 MG tablet Take 2 tablets (20 mg total) by mouth at bedtime as needed for sleep. 11/26/23  Yes Johnny Garnette LABOR, MD  Blood Glucose Monitoring Suppl (ONE TOUCH ULTRA 2) w/Device KIT Use to check blood glucose daily Dx Code: E11.9 09/25/23   Johnny Garnette LABOR, MD  glucose blood [facility] ULTRA) test strip Use as instructed 09/28/23   Johnny Garnette LABOR, MD  lidocaine  (LIDODERM ) 5 % Place 1 patch onto the skin daily. Remove & Discard patch within 12 hours or as directed by MD 02/26/24   Nivia Colon, PA-C  methocarbamol  (ROBAXIN ) 750 MG tablet Take 750 mg by mouth. 07/20/20   [provider]  OneTouch Delica Lancets 33G MISC 1 each by Does not apply route daily at 12 noon. 09/28/23   Johnny Garnette LABOR, MD  polyvinyl alcohol (LIQUIFILM TEARS) 1.4 % ophthalmic solution Place 1 drop into both eyes as needed.    [provider]  SODIUM FLUORIDE 5000 SENSITIVE 1.1-5 % GEL Take 1 Application by mouth in the morning and at bedtime. 02/24/23   [provider]  lisinopril  (ZESTRIL ) 10 MG tablet TAKE 1 TABLET BY MOUTH EVERY DAY Patient taking differently: Take 10 mg by mouth daily.  02/02/20 04/12/20  Johnny Garnette LABOR, MD     Positive ROS: All other systems have been reviewed and were otherwise negative with the exception of those mentioned in the HPI and as above.  Physical Exam: General: Alert, no acute distress Cardiovascular: No pedal edema Respiratory: No cyanosis, no use of accessory musculature GI: abdomen soft Skin: No lesions in the area of chief complaint Neurologic: Sensation intact distally Psychiatric: Patient is competent for consent with normal mood and affect Lymphatic: no lymphedema  MUSCULOSKELETAL: exam stable  Assessment: trigger finger of left thumb  Plan: Plan for Procedure(s): RELEASE, A1 PULLEY, FOR TRIGGER FINGER  The risks benefits and alternatives were discussed with the patient including but not limited to the risks of nonoperative treatment, versus surgical intervention including infection, bleeding, nerve injury,  blood clots, cardiopulmonary complications, morbidity, mortality, among others, and they were willing to proceed.   Ozell Jerri, MD 07/27/2024 11:45 AM

## 2024-07-27 NOTE — Anesthesia Postprocedure Evaluation (Signed)
 Anesthesia Post Note  Patient: [name]  Procedure(s) Performed: RELEASE, A1 PULLEY, FOR TRIGGER FINGER (Left: Thumb)     Patient location during evaluation: PACU Anesthesia Type: MAC Level of consciousness: awake and alert Pain management: pain level controlled Vital Signs Assessment: post-procedure vital signs reviewed and stable Respiratory status: spontaneous breathing, nonlabored ventilation, respiratory function stable and patient connected to nasal cannula oxygen Cardiovascular status: stable and blood pressure returned to baseline Postop Assessment: no apparent nausea or vomiting Anesthetic complications: no   No notable events documented.  Last Vitals:  Vitals:   07/27/24 1414 07/27/24 1415  BP: (!) 172/88   Pulse: 72   Resp: 20   Temp: (!) 36.3 C (!) 36.3 C  SpO2: 98%     Last Pain:  Vitals:   07/27/24 1415  TempSrc: Temporal  PainSc:                  [name]

## 2024-07-27 NOTE — Telephone Encounter (Signed)
 Patient had surgery today, and that provider advised patient on medication.

## 2024-07-27 NOTE — Telephone Encounter (Signed)
 Yes she can take 2 pills BID until the surgery

## 2024-07-27 NOTE — Discharge Instructions (Addendum)
 Postoperative instructions:  Weightbearing instructions: don't lift more than 10 lbs for 4 weeks  Dressing instructions: Keep your dressing and/or splint clean and dry at all times.  It will be removed at your first post-operative appointment.  Your stitches and/or staples will be removed at this visit.  Incision instructions:  Do not soak your incision for 3 weeks after surgery.  If the incision gets wet, pat dry and do not scrub the incision.  Pain control:  You have been given a prescription to be taken as directed for post-operative pain control.  In addition, elevate the operative extremity above the heart at all times to prevent swelling and throbbing pain.  Take over-the-counter Colace, 100mg  by mouth twice a day while taking narcotic pain medications to help prevent constipation.  Follow up appointments: 1) 10 days for suture removal and wound check. 2) Dr. Roda Shutters as scheduled.   -------------------------------------------------------------------------------------------------------------  After Surgery Pain Control:  After your surgery, post-surgical discomfort or pain is likely. This discomfort can last several days to a few weeks. At certain times of the day your discomfort may be more intense.  Did you receive a nerve block?  A nerve block can provide pain relief for one hour to two days after your surgery. As long as the nerve block is working, you will experience little or no sensation in the area the surgeon operated on.  As the nerve block wears off, you will begin to experience pain or discomfort. It is very important that you begin taking your prescribed pain medication before the nerve block fully wears off. Treating your pain at the first sign of the block wearing off will ensure your pain is better controlled and more tolerable when full-sensation returns. Do not wait until the pain is intolerable, as the medicine will be less effective. It is better to treat pain in  advance than to try and catch up.  General Anesthesia:  If you did not receive a nerve block during your surgery, you will need to start taking your pain medication shortly after your surgery and should continue to do so as prescribed by your surgeon.  Pain Medication:  Most commonly we prescribe Vicodin and Percocet for post-operative pain. Both of these medications contain a combination of acetaminophen (Tylenol) and a narcotic to help control pain.   It takes between 30 and 45 minutes before pain medication starts to work. It is important to take your medication before your pain level gets too intense.   Nausea is a common side effect of many pain medications. You will want to eat something before taking your pain medicine to help prevent nausea.   If you are taking a prescription pain medication that contains acetaminophen, we recommend that you do not take additional over the counter acetaminophen (Tylenol).  Other pain relieving options:   Using a cold pack to ice the affected area a few times a day (15 to 20 minutes at a time) can help to relieve pain, reduce swelling and bruising.   Elevation of the affected area can also help to reduce pain and swelling.  Per [facility] clinic policy, our goal is ensure optimal postoperative pain control with a multimodal pain management strategy. For all [facility] patients, our goal is to wean post-operative narcotic medications by 6 weeks post-operatively. If this is not possible due to utilization of pain medication prior to surgery, your [facility] doctor will support your acute post-operative pain control for the first 6 weeks postoperatively, with a  plan to transition you back to your primary pain team following that. Cyndia Skeeters will work to ensure a Therapist, occupational.   Post Anesthesia Home Care Instructions  Activity: Get plenty of rest for the remainder of the day. A responsible individual must stay with you for 24 hours following the procedure.   For the next 24 hours, DO NOT: -Drive a car -Advertising copywriter -Drink alcoholic beverages -Take any medication unless instructed by your physician -Make any legal decisions or sign important papers.  Meals: Start with liquid foods such as gelatin or soup. Progress to regular foods as tolerated. Avoid greasy, spicy, heavy foods. If nausea and/or vomiting occur, drink only clear liquids until the nausea and/or vomiting subsides. Call your physician if vomiting continues.  Special Instructions/Symptoms: Your throat may feel dry or sore from the anesthesia or the breathing tube placed in your throat during surgery. If this causes discomfort, gargle with warm salt water. The discomfort should disappear within 24 hours.  If you had a scopolamine patch placed behind your ear for the management of post- operative nausea and/or vomiting:  1. The medication in the patch is effective for 72 hours, after which it should be removed.  Wrap patch in a tissue and discard in the trash. Wash hands thoroughly with soap and water. 2. You may remove the patch earlier than 72 hours if you experience unpleasant side effects which may include dry mouth, dizziness or visual disturbances. 3. Avoid touching the patch. Wash your hands with soap and water after contact with the patch.

## 2024-07-27 NOTE — Telephone Encounter (Signed)
 Copied from CRM #[id number]. Topic: Clinical - Medical Advice >> Jul 25, 2024  1:21 PM [name] ORN wrote: Reason for CRM:  Patient's latest potassium levels was 2.7 She currently takes one pill everyday wants to know if it is okay to take two pills today and two tomorrow? Has surgery on Wednesday. Please call back and advise, patient.

## 2024-07-27 NOTE — Transfer of Care (Signed)
 Immediate Anesthesia Transfer of Care Note  Patient: [name]  Procedure(s) Performed: RELEASE, A1 PULLEY, FOR TRIGGER FINGER (Left: Thumb)  Patient Location: PACU  Anesthesia Type:General  Level of Consciousness: awake, alert , and oriented  Airway & Oxygen Therapy: Patient Spontanous Breathing and Patient connected to face mask oxygen  Post-op Assessment: Report given to RN and Post -op Vital signs reviewed and stable  Post vital signs: Reviewed and stable  Last Vitals:  Vitals Value Taken Time  BP 124/67 07/27/24 13:41  Temp    Pulse 63 07/27/24 13:45  Resp 17 07/27/24 13:45  SpO2 100 % 07/27/24 13:45  Vitals shown include unfiled device data.  Last Pain:  Vitals:   07/27/24 1207  TempSrc: Temporal  PainSc: 4       Patients Stated Pain Goal: 4 (07/27/24 1207)  Complications: No notable events documented.

## 2024-07-28 ENCOUNTER — Encounter (HOSPITAL_BASED_OUTPATIENT_CLINIC_OR_DEPARTMENT_OTHER): Payer: Self-pay | Admitting: Orthopaedic Surgery

## 2024-08-03 ENCOUNTER — Encounter: Admitting: Physician Assistant

## 2024-08-10 ENCOUNTER — Ambulatory Visit (INDEPENDENT_AMBULATORY_CARE_PROVIDER_SITE_OTHER): Admitting: Physician Assistant

## 2024-08-10 DIAGNOSIS — M65312 Trigger thumb, left thumb: Secondary | ICD-10-CM

## 2024-08-10 NOTE — Progress Notes (Signed)
 Post-Op Visit Note   Patient: [name]           Date of Birth: 27-May-1961           MRN: [id number] Visit Date: 08/10/2024 PCP: Johnny Garnette LABOR, MD   Assessment & Plan:  Chief Complaint:  Chief Complaint  Patient presents with   Left Hand - Follow-up    Left trigger thumb release 07/27/2024   Visit Diagnoses:  1. Trigger thumb of left hand     Plan: Patient is a pleasant 63 year old female who comes in today 2 weeks status post left trigger thumb release.  She has been doing well.  She notes some achiness and burning around the incision.  Examination of the left hand reveals a well-healing surgical incision with nylon sutures in place.  No evidence of infection or cellulitis.  Fingers warm well-perfused.  She is neurovascular intact distally.  Today, sutures were removed and Steri-Strips applied.  No heavy lifting or submerging her hand underwater for 2 weeks.  She may begin with range of motion exercises.  Follow-up in 2 weeks for recheck.  Call with concerns or questions.  Follow-Up Instructions: Return in about 2 weeks (around 08/24/2024).   Orders:  No orders of the defined types were placed in this encounter.  No orders of the defined types were placed in this encounter.   Imaging: No new imaging  PMFS History: Patient Active Problem List   Diagnosis Date Noted   Trigger thumb of left hand 05/03/2024   Trigger thumb, right thumb 05/14/2022   Impingement syndrome of right shoulder 05/14/2022   Carpal tunnel syndrome, left upper limb 08/23/2021   Neuropathy 10/19/2020   DDD (degenerative disc disease), lumbar 07/16/2018    Class: Chronic   Other spondylosis with radiculopathy, lumbar region 07/16/2018    Class: Chronic   S/P lumbar fusion 07/13/2018   Tenosynovitis, de Quervain 08/10/2017   Multiple lipomas 07/15/2017   Insomnia 07/16/2016   Morbid obesity with BMI of 40.0-44.9, adult (HCC) 10/26/2015   Malignant neoplasm of lower-inner quadrant of  right breast of female, estrogen receptor positive (HCC) 08/08/2015   S/P revision of total knee 07/24/2015   Loose total knee arthroplasty 07/02/2015   Type 2 diabetes mellitus without complications (HCC) 03/05/2015   Adhesive capsulitis of left shoulder 10/11/2014   Paraspinous mass, left 03/31/2013   Other symptoms and signs involving the nervous system 03/31/2013   Internal hemorrhoid 02/17/2013   Neoplasm of soft tissue, left shoulder and left upper arm 08/03/2012   Cholelithiasis with cholecystitis 10/08/2011   Hypokalemia 09/17/2011   Gastro-esophageal reflux disease without esophagitis 08/08/2010   Gout 12/18/2008   Essential (primary) hypertension 03/15/2007   OA (osteoarthritis) of knee 03/15/2007   HEADACHE 03/15/2007   Past Medical History:  Diagnosis Date   Breast cancer (HCC) 2017   right breast   Breast cancer of lower-inner quadrant of right female breast (HCC) 08/08/2015   Chronic pain    Colon polyps    DDD (degenerative disc disease), cervical    Degenerative joint disease    Diabetes mellitus without complication (HCC)    Diverticulosis    Gallstones    GERD (gastroesophageal reflux disease)    Headache(784.0)    migraine like per patient   Hypertension    Low back pain    dr orlando, pain management   Osteoarthritis    Personal history of chemotherapy 2017   Personal history of radiation therapy 2017    Family  History  Problem Relation Age of Onset   Stomach cancer Mother    Heart disease Father    Prostate cancer Father    Kidney cancer Sister        one out of the four   Hypertension Sister    Hypertension Brother    Colon cancer Maternal Grandmother    Crohn's disease Other        nephew   Esophageal cancer Neg Hx    Colon polyps Neg Hx    Rectal cancer Neg Hx     Past Surgical History:  Procedure Laterality Date   CARPAL TUNNEL RELEASE Right 09/30/2021   Procedure: Right CARPAL TUNNEL RELEASE;  Surgeon: Romona Harari, MD;   Location: [facility];  Service: Orthopedics;  Laterality: Right;   CHOLECYSTECTOMY  10/28/2011   Procedure: LAPAROSCOPIC CHOLECYSTECTOMY WITH INTRAOPERATIVE CHOLANGIOGRAM;  Surgeon: Krystal CHRISTELLA Spinner, MD;  Location: [facility];  Service: General;  Laterality: N/A;   COLONOSCOPY  04/21/2023   per Dr. Abran, adenmatous polyps, repeat in 5 yrs   COLONOSCOPY WITH ESOPHAGOGASTRODUODENOSCOPY (EGD)  04/13/2018   per Dr. Abran, adenomatous polyp, repeat in 5 yrs    EXAM UNDER ANESTHESIA WITH MANIPULATION OF KNEE Right 05/30/2003   EXAM UNDER ANESTHESIA WITH MANIPULATION OF KNEE Left 12/27/2002   GANGLION CYST EXCISION Right    right wrist   KNEE ARTHROSCOPY Bilateral 01/02/2000   KNEE ARTHROSCOPY Right    LAPAROSCOPIC VAGINAL HYSTERECTOMY WITH SALPINGO OOPHORECTOMY Bilateral 07/13/2006   LIPOMA EXCISION Right 02/21/2008   hip   MASS EXCISION  08/24/2012   Procedure: EXCISION MASS;  Surgeon: Krystal CHRISTELLA Spinner, MD;  Location: [facility];  Service: General;  Laterality: Left;  excisie soft tissue masses left shoulder(back) & left upper arm   MASS EXCISION Left 01/24/2014   Procedure: EXCISION SOFT TISSUE MASSES LOWER LEFT BACK;  Surgeon: Krystal CHRISTELLA Spinner, MD;  Location: [facility];  Service: General;  Laterality: Left;   MASS EXCISION Right 04/30/2016   Procedure: EXCISION OF SKIN RIGHT CHEST WALL;  Surgeon: Deward Null III, MD;  Location: [facility];  Service: General;  Laterality: Right;  EXCISION OF SKIN RIGHT CHEST WALL   MASTECTOMY Right 2017   MASTECTOMY W/ SENTINEL NODE BIOPSY Right 10/01/2015   Procedure: RIGHT MASTECTOMY WITH SENTINEL LYMPH NODE BIOPSY;  Surgeon: Deward Null III, MD;  Location: [facility];  Service: General;  Laterality: Right;   PORT-A-CATH REMOVAL Left 04/30/2016   Procedure: REMOVAL PORT-A-CATH;  Surgeon: Deward Null III, MD;  Location: [facility];  Service: General;  Laterality: Left;  REMOVAL PORT-A-CATH   PORTACATH  PLACEMENT Left 11/01/2015   Procedure: INSERTION PORT-A-CATH;  Surgeon: Deward Null III, MD;  Location: [facility];  Service: General;  Laterality: Left;   POSTERIOR FUSION LUMBAR SPINE  07/13/2018   per Dr. Lucilla, at L4-5 and L5-S1   REPLACEMENT UNICONDYLAR JOINT KNEE Left 04/20/2001   REVISION TOTAL KNEE ARTHROPLASTY Right 06/18/2004   REVISION TOTAL KNEE ARTHROPLASTY Left 11/15/2002   REVISION TOTAL KNEE ARTHROPLASTY Left 10/12/2001   REVISION TOTAL KNEE ARTHROPLASTY Right 07/09/2015   TONSILLECTOMY     TOTAL KNEE ARTHROPLASTY Right 04/25/2003   TOTAL KNEE ARTHROPLASTY Left 06/25/2009   TRIGGER FINGER RELEASE Left 07/27/2024   Procedure: RELEASE, A1 PULLEY, FOR TRIGGER FINGER;  Surgeon: Jerri Kay CHRISTELLA, MD;  Location: [facility];  Service: Orthopedics;  Laterality: Left;   Social History   Occupational History  Occupation: retired/disabled  Tobacco Use   Smoking status: Every Day    Current packs/day: 0.00    Average packs/day: 0.3 packs/day for 20.0 years (5.0 ttl pk-yrs)    Types: Cigarettes    Start date: 05/08/2000    Last attempt to quit: 05/08/2020    Years since quitting: 4.2   Smokeless tobacco: Former    Quit date: 09/30/2015   Tobacco comments:    smokes about 4 cigs/day; working on quitting - 08/22/16 gwd  Vaping Use   Vaping status: Never Used  Substance and Sexual Activity   Alcohol use: No    Alcohol/week: 0.0 standard drinks of alcohol   Drug use: No   Sexual activity: Yes    Birth control/protection: Surgical    Comment: hyst

## 2024-08-25 ENCOUNTER — Encounter: Admitting: Physician Assistant

## 2024-08-25 ENCOUNTER — Other Ambulatory Visit: Payer: Self-pay | Admitting: Family Medicine

## 2024-08-25 DIAGNOSIS — I10 Essential (primary) hypertension: Secondary | ICD-10-CM

## 2024-09-01 DIAGNOSIS — Z170 Estrogen receptor positive status [ER+]: Secondary | ICD-10-CM | POA: Diagnosis not present

## 2024-09-01 DIAGNOSIS — C50511 Malignant neoplasm of lower-outer quadrant of right female breast: Secondary | ICD-10-CM | POA: Diagnosis not present

## 2024-09-07 ENCOUNTER — Other Ambulatory Visit: Payer: Self-pay | Admitting: Family Medicine

## 2024-09-07 DIAGNOSIS — I10 Essential (primary) hypertension: Secondary | ICD-10-CM

## 2024-09-08 ENCOUNTER — Other Ambulatory Visit: Payer: Self-pay | Admitting: Family Medicine

## 2024-09-12 DIAGNOSIS — M150 Primary generalized (osteo)arthritis: Secondary | ICD-10-CM | POA: Diagnosis not present

## 2024-09-12 DIAGNOSIS — M47812 Spondylosis without myelopathy or radiculopathy, cervical region: Secondary | ICD-10-CM | POA: Diagnosis not present

## 2024-09-12 DIAGNOSIS — M545 Low back pain, unspecified: Secondary | ICD-10-CM | POA: Diagnosis not present

## 2024-09-12 DIAGNOSIS — M25562 Pain in left knee: Secondary | ICD-10-CM | POA: Diagnosis not present

## 2024-09-26 ENCOUNTER — Other Ambulatory Visit: Payer: Self-pay | Admitting: Family Medicine

## 2024-09-26 NOTE — Telephone Encounter (Signed)
 Pt LOV was on 03/22/24 Last refill was done on 11/26/23 Please advise

## 2024-10-24 ENCOUNTER — Ambulatory Visit: Admitting: Family Medicine

## 2024-10-25 ENCOUNTER — Encounter: Payer: Self-pay | Admitting: Family Medicine

## 2024-10-25 ENCOUNTER — Ambulatory Visit: Payer: Self-pay | Admitting: Family Medicine

## 2024-10-25 VITALS — BP 132/80 | HR 74 | Temp 98.5°F | Wt 263.0 lb

## 2024-10-25 DIAGNOSIS — D179 Benign lipomatous neoplasm, unspecified: Secondary | ICD-10-CM

## 2024-10-25 DIAGNOSIS — E876 Hypokalemia: Secondary | ICD-10-CM

## 2024-10-25 DIAGNOSIS — M25519 Pain in unspecified shoulder: Secondary | ICD-10-CM | POA: Diagnosis not present

## 2024-10-25 DIAGNOSIS — M2550 Pain in unspecified joint: Secondary | ICD-10-CM

## 2024-10-25 LAB — BASIC METABOLIC PANEL WITH GFR
BUN: 11 mg/dL (ref 6–23)
CO2: 34 meq/L — ABNORMAL HIGH (ref 19–32)
Calcium: 9.8 mg/dL (ref 8.4–10.5)
Chloride: 97 meq/L (ref 96–112)
Creatinine, Ser: 0.77 mg/dL (ref 0.40–1.20)
GFR: 81.22 mL/min
Glucose, Bld: 131 mg/dL — ABNORMAL HIGH (ref 70–99)
Potassium: 3.1 meq/L — ABNORMAL LOW (ref 3.5–5.1)
Sodium: 141 meq/L (ref 135–145)

## 2024-10-25 LAB — SEDIMENTATION RATE: Sed Rate: 15 mm/h (ref 0–30)

## 2024-10-25 LAB — C-REACTIVE PROTEIN: CRP: 1 mg/dL (ref 1.0–20.0)

## 2024-10-27 ENCOUNTER — Telehealth: Payer: Self-pay | Admitting: *Deleted

## 2024-10-27 ENCOUNTER — Telehealth: Payer: Self-pay | Admitting: Family Medicine

## 2024-10-27 ENCOUNTER — Ambulatory Visit: Payer: Self-pay | Admitting: Family Medicine

## 2024-10-27 NOTE — Telephone Encounter (Unsigned)
 Copied from CRM [id number]. Topic: Clinical - Lab/Test Results >> Oct 27, 2024 12:39 PM Mercedes MATSU wrote: Reason for CRM: Patient called in requesting lab results. Patients labs do not show they were viewed yet. Patient is requesting a call back once labs are ready, and can be reached at [phone].

## 2024-10-27 NOTE — Addendum Note (Signed)
 Addended by: JOHNNY SENIOR A on: 10/27/2024 12:43 PM   Modules accepted: Orders

## 2024-10-27 NOTE — Telephone Encounter (Signed)
 They are located in the right middle back and right buttock

## 2024-10-27 NOTE — Telephone Encounter (Signed)
 Copied from CRM [id number]. Topic: Referral - Question >> Oct 27, 2024 11:08 AM Wess RAMAN wrote: Reason for CRM: Jon from [facility] stated the referral is not clear where the lipomas is located.  Callback #: [id number] ext [phone] Fax #: [phone]

## 2024-10-28 NOTE — Telephone Encounter (Signed)
Reviewed lab results with pt- voiced understanding

## 2024-10-28 NOTE — Telephone Encounter (Signed)
 Spoke Angela with [facility] stated that she spoke with pt and got the information she needed
